# Patient Record
Sex: Female | Born: 1940
Health system: Southern US, Community
[De-identification: ages and names within clinical notes are randomized; demographics above are authoritative.]

## PROBLEM LIST (undated history)

## (undated) DIAGNOSIS — I471 Supraventricular tachycardia, unspecified: Secondary | ICD-10-CM

## (undated) DIAGNOSIS — I10 Essential (primary) hypertension: Secondary | ICD-10-CM

## (undated) DIAGNOSIS — G709 Myoneural disorder, unspecified: Secondary | ICD-10-CM

## (undated) DIAGNOSIS — C801 Malignant (primary) neoplasm, unspecified: Secondary | ICD-10-CM

## (undated) DIAGNOSIS — J45909 Unspecified asthma, uncomplicated: Secondary | ICD-10-CM

## (undated) DIAGNOSIS — T7840XA Allergy, unspecified, initial encounter: Secondary | ICD-10-CM

## (undated) DIAGNOSIS — E7800 Pure hypercholesterolemia, unspecified: Secondary | ICD-10-CM

## (undated) DIAGNOSIS — G629 Polyneuropathy, unspecified: Secondary | ICD-10-CM

## (undated) DIAGNOSIS — K219 Gastro-esophageal reflux disease without esophagitis: Secondary | ICD-10-CM

## (undated) HISTORY — DX: Malignant (primary) neoplasm, unspecified: C80.1

## (undated) HISTORY — DX: Allergy, unspecified, initial encounter: T78.40XA

## (undated) HISTORY — DX: Myoneural disorder, unspecified: G70.9

## (undated) HISTORY — DX: Unspecified asthma, uncomplicated: J45.909

## (undated) HISTORY — DX: Gastro-esophageal reflux disease without esophagitis: K21.9

## (undated) HISTORY — PX: SPINE SURGERY: SHX786

## (undated) HISTORY — PX: BREAST BIOPSY: SHX20

---

## 1999-04-03 ENCOUNTER — Other Ambulatory Visit: Admission: RE | Admit: 1999-04-03 | Discharge: 1999-04-03 | Payer: Self-pay | Admitting: Obstetrics and Gynecology

## 2000-05-16 ENCOUNTER — Encounter: Payer: Self-pay | Admitting: Obstetrics and Gynecology

## 2000-05-16 ENCOUNTER — Encounter: Admission: RE | Admit: 2000-05-16 | Discharge: 2000-05-16 | Payer: Self-pay | Admitting: Obstetrics and Gynecology

## 2001-07-06 ENCOUNTER — Encounter: Payer: Self-pay | Admitting: Obstetrics and Gynecology

## 2001-07-06 ENCOUNTER — Encounter: Admission: RE | Admit: 2001-07-06 | Discharge: 2001-07-06 | Payer: Self-pay | Admitting: Obstetrics and Gynecology

## 2001-09-08 ENCOUNTER — Encounter: Payer: Self-pay | Admitting: Emergency Medicine

## 2001-09-08 ENCOUNTER — Emergency Department (HOSPITAL_COMMUNITY): Admission: EM | Admit: 2001-09-08 | Discharge: 2001-09-08 | Payer: Self-pay | Admitting: Emergency Medicine

## 2001-09-08 ENCOUNTER — Encounter: Payer: Self-pay | Admitting: *Deleted

## 2001-09-18 ENCOUNTER — Encounter: Payer: Self-pay | Admitting: Internal Medicine

## 2002-06-22 ENCOUNTER — Other Ambulatory Visit: Admission: RE | Admit: 2002-06-22 | Discharge: 2002-06-22 | Payer: Self-pay | Admitting: Family Medicine

## 2002-08-19 LAB — HM COLONOSCOPY

## 2003-08-20 HISTORY — PX: SKIN CANCER EXCISION: SHX779

## 2003-10-06 ENCOUNTER — Other Ambulatory Visit: Admission: RE | Admit: 2003-10-06 | Discharge: 2003-10-06 | Payer: Self-pay | Admitting: Family Medicine

## 2004-03-20 ENCOUNTER — Encounter: Admission: RE | Admit: 2004-03-20 | Discharge: 2004-03-20 | Payer: Self-pay | Admitting: Family Medicine

## 2004-08-23 ENCOUNTER — Ambulatory Visit: Payer: Self-pay | Admitting: Internal Medicine

## 2004-09-21 ENCOUNTER — Ambulatory Visit: Payer: Self-pay | Admitting: Internal Medicine

## 2004-11-26 ENCOUNTER — Ambulatory Visit: Payer: Self-pay | Admitting: Internal Medicine

## 2005-01-10 ENCOUNTER — Ambulatory Visit: Payer: Self-pay | Admitting: Internal Medicine

## 2005-05-31 ENCOUNTER — Ambulatory Visit: Payer: Self-pay | Admitting: Internal Medicine

## 2005-07-19 ENCOUNTER — Encounter: Admission: RE | Admit: 2005-07-19 | Discharge: 2005-07-19 | Payer: Self-pay | Admitting: Family Medicine

## 2005-11-26 ENCOUNTER — Ambulatory Visit: Payer: Self-pay | Admitting: Internal Medicine

## 2006-05-06 ENCOUNTER — Ambulatory Visit: Payer: Self-pay | Admitting: Internal Medicine

## 2006-05-14 ENCOUNTER — Ambulatory Visit: Payer: Self-pay | Admitting: Internal Medicine

## 2006-10-01 ENCOUNTER — Ambulatory Visit: Payer: Self-pay | Admitting: Internal Medicine

## 2006-11-17 ENCOUNTER — Ambulatory Visit: Payer: Self-pay | Admitting: Internal Medicine

## 2006-12-09 ENCOUNTER — Ambulatory Visit: Payer: Self-pay | Admitting: Pulmonary Disease

## 2006-12-25 ENCOUNTER — Ambulatory Visit: Payer: Self-pay | Admitting: Internal Medicine

## 2007-10-29 LAB — HM PAP SMEAR: HM Pap smear: NORMAL

## 2008-03-19 LAB — HM MAMMOGRAPHY: HM Mammogram: NORMAL

## 2008-03-29 LAB — HM DEXA SCAN: HM Dexa Scan: NORMAL

## 2009-03-19 HISTORY — PX: LUMBAR LAMINECTOMY: SHX95

## 2009-03-24 ENCOUNTER — Encounter: Admission: RE | Admit: 2009-03-24 | Discharge: 2009-03-24 | Payer: Self-pay | Admitting: Sports Medicine

## 2009-04-13 ENCOUNTER — Inpatient Hospital Stay (HOSPITAL_COMMUNITY): Admission: RE | Admit: 2009-04-13 | Discharge: 2009-04-15 | Payer: Self-pay | Admitting: Orthopedic Surgery

## 2009-05-04 ENCOUNTER — Encounter: Admission: RE | Admit: 2009-05-04 | Discharge: 2009-05-18 | Payer: Self-pay | Admitting: Orthopedic Surgery

## 2009-05-19 ENCOUNTER — Encounter: Admission: RE | Admit: 2009-05-19 | Discharge: 2009-08-17 | Payer: Self-pay | Admitting: Orthopedic Surgery

## 2009-09-05 ENCOUNTER — Emergency Department (HOSPITAL_COMMUNITY): Admission: EM | Admit: 2009-09-05 | Discharge: 2009-09-06 | Payer: Self-pay | Admitting: Emergency Medicine

## 2009-09-06 ENCOUNTER — Ambulatory Visit (HOSPITAL_COMMUNITY): Admission: RE | Admit: 2009-09-06 | Discharge: 2009-09-06 | Payer: Self-pay | Admitting: Emergency Medicine

## 2009-09-06 ENCOUNTER — Ambulatory Visit: Payer: Self-pay | Admitting: Vascular Surgery

## 2010-11-13 ENCOUNTER — Encounter: Payer: Self-pay | Admitting: Family Medicine

## 2010-11-13 DIAGNOSIS — E1169 Type 2 diabetes mellitus with other specified complication: Secondary | ICD-10-CM | POA: Insufficient documentation

## 2010-11-13 DIAGNOSIS — M792 Neuralgia and neuritis, unspecified: Secondary | ICD-10-CM

## 2010-11-13 DIAGNOSIS — E1140 Type 2 diabetes mellitus with diabetic neuropathy, unspecified: Secondary | ICD-10-CM | POA: Insufficient documentation

## 2010-11-13 DIAGNOSIS — J45909 Unspecified asthma, uncomplicated: Secondary | ICD-10-CM | POA: Insufficient documentation

## 2010-11-13 DIAGNOSIS — E785 Hyperlipidemia, unspecified: Secondary | ICD-10-CM

## 2010-11-13 DIAGNOSIS — C4490 Unspecified malignant neoplasm of skin, unspecified: Secondary | ICD-10-CM | POA: Insufficient documentation

## 2010-11-13 DIAGNOSIS — R000 Tachycardia, unspecified: Secondary | ICD-10-CM | POA: Insufficient documentation

## 2010-11-13 DIAGNOSIS — Z780 Asymptomatic menopausal state: Secondary | ICD-10-CM | POA: Insufficient documentation

## 2010-11-13 DIAGNOSIS — E119 Type 2 diabetes mellitus without complications: Secondary | ICD-10-CM

## 2010-11-13 DIAGNOSIS — I471 Supraventricular tachycardia: Secondary | ICD-10-CM

## 2010-11-13 DIAGNOSIS — K449 Diaphragmatic hernia without obstruction or gangrene: Secondary | ICD-10-CM

## 2010-11-13 DIAGNOSIS — I10 Essential (primary) hypertension: Secondary | ICD-10-CM | POA: Insufficient documentation

## 2010-11-13 DIAGNOSIS — I472 Ventricular tachycardia: Secondary | ICD-10-CM | POA: Insufficient documentation

## 2010-11-24 LAB — GLUCOSE, CAPILLARY
Glucose-Capillary: 128 mg/dL — ABNORMAL HIGH (ref 70–99)
Glucose-Capillary: 137 mg/dL — ABNORMAL HIGH (ref 70–99)

## 2010-11-24 LAB — URINE MICROSCOPIC-ADD ON

## 2010-11-24 LAB — PROTIME-INR
INR: 1 (ref 0.00–1.49)
Prothrombin Time: 13 seconds (ref 11.6–15.2)

## 2010-11-24 LAB — CBC
HCT: 35.9 % — ABNORMAL LOW (ref 36.0–46.0)
Hemoglobin: 12.2 g/dL (ref 12.0–15.0)
MCHC: 33.8 g/dL (ref 30.0–36.0)
MCV: 87 fL (ref 78.0–100.0)
Platelets: 257 10*3/uL (ref 150–400)
RBC: 4.13 MIL/uL (ref 3.87–5.11)

## 2010-11-24 LAB — DIFFERENTIAL
Basophils Absolute: 0 10*3/uL (ref 0.0–0.1)
Eosinophils Relative: 4 % (ref 0–5)
Monocytes Absolute: 0.8 10*3/uL (ref 0.1–1.0)
Monocytes Relative: 8 % (ref 3–12)
Neutrophils Relative %: 53 % (ref 43–77)

## 2010-11-24 LAB — COMPREHENSIVE METABOLIC PANEL
Chloride: 98 mEq/L (ref 96–112)
Creatinine, Ser: 0.56 mg/dL (ref 0.4–1.2)
GFR calc Af Amer: >60 mL/min (ref >60)
Sodium: 136 mEq/L (ref 135–145)
Total Protein: 7.5 g/dL (ref 6.0–8.3)

## 2010-11-24 LAB — URINALYSIS, ROUTINE W REFLEX MICROSCOPIC
Glucose, UA: NEGATIVE mg/dL
Nitrite: NEGATIVE
Protein, ur: NEGATIVE mg/dL
Specific Gravity, Urine: 1.009 (ref 1.005–1.030)

## 2010-11-24 LAB — ABO/RH: ABO/RH(D): A NEG

## 2010-11-24 LAB — TYPE AND SCREEN: ABO/RH(D): A NEG

## 2010-11-24 LAB — APTT: aPTT: 26 seconds (ref 24–37)

## 2011-01-01 NOTE — Op Note (Signed)
NAME:  Kristi Willis, HENDERSON           ACCOUNT NO.:  [id number]   MEDICAL RECORD NO.:  [id number]          PATIENT TYPE:  AMB   LOCATION:  DAY                          FACILITY:  [facility]   PHYSICIAN:  Georges Lynch. Gioffre, M.D.DATE OF BIRTH:  [birth year]   DATE OF PROCEDURE:  04/13/2009  DATE OF DISCHARGE:                               OPERATIVE REPORT   SURGEON:  Georges Lynch. Darrelyn Hillock, M.D.   ASSISTANT:  Jene Every, M.D.   PREOPERATIVE DIAGNOSES:  1. Severe lateral recess stenosis at L4-5 and L5-S1 ont right.  2. Large herniated disk at L4-5 on the right.   POSTOPERATIVE DIAGNOSES:  1. Severe lateral recess stenosis at L4-5 and L5-S1 on the right.  2. Large herniated disk at L4-5 on the right.   OPERATION:  1. I did a complete decompressive lumbar laminectomy at L4-L5, L5-S1      on the right for spinal stenosis and lateral recess stenosis.  2. Microdiskectomy at L4-L5 on the right.  All her symptoms on the      right only.   PROCEDURE:  Under general anesthesia with the patient on the spinal  frame, routine orthopedic prep and drape of the back carried out.  She  had 1 gram of IV Ancef.  Two incisions were placed and inserted and two  needles were inserted in the back.  X-ray was taken to verify the  position.  Following that, incision was made over the L4-5, L5-S1  interspace.  At this particular time I separated muscle from the lamina  spinous process bilaterally.  Another x-ray was taken.  Following that I  went ahead and inserted the [facility] retractors.  I isolated the L4-5  and the L5-S1 space on the right.  I first started out my  hemilaminectomy of S1, worked my way up proximally, completely removed  the lamina of L5 as he had severe stenosis and overlapping shingling  effect of the L4-5 region.  I had to go out laterally and decompress the  lateral recess to a partial facetectomy as well.  Once I identified the  S1 root and the L5 root, we cauterized lateral recess veins.   He had a  large disk at L4-5 on the right.  Note the microscope was brought in  during the procedure.  We first obviously removed ligamentum flavum as  well.  We protected the dura at all time with cottonoids.  We made a  cruciate incision in the posterior longitudinal ligament at L4-5 and did  a microdiskectomy.  We utilized a nerve hook and the Epstein curettes  from medially, distally, proximally, and laterally to make sure there  were no other pressures noted.  We did nice foraminotomy for the L5  root.  The root now was wide open.  We then went distal at L5-S1.  There  was more or less a hard disk actually, which was a spur.  No diskectomy  was necessary at L5-S1 on the right.  The nerve root was completely  free.  We were able to easily move it about once we decompressed the  lateral recess and foramina.  After we had good decompression of both  the L5 root and S1 root.  We thoroughly irrigated out the area.  I  injected 10 mL of FloSeal into the area and then loosely applied some  thrombin-soaked Gelfoam distally and proximally but not over the dura.  I then closed the  wound in layers in the usual fashion.  I did not close the distal deep  part of the wound.  A small portion was left open for drainage purposes.  Subcu was closed with Vicryl, skin with metal staples.  Sterile  Neosporin dressing was applied.  The patient left the operating room in  satisfactory condition.           ______________________________  Georges Lynch Darrelyn Hillock, M.D.     RAG/MEDQ  D:  04/13/2009  T:  04/14/2009  Job:  [id number]   cc:   Ernestina Penna, M.D.  Fax: [phone]

## 2011-01-01 NOTE — Assessment & Plan Note (Signed)
[facility]                             PULMONARY OFFICE NOTE   NAME:Kristi Willis, Kristi Willis                  MRN:          [id number]  DATE:12/25/2006                            DOB:          1941/02/28    HISTORY OF PRESENT ILLNESS:  The patient is a 70 year old white female,  patient of Dr. Roxy Cedar, who has a known history of asthma and allergic  rhinitis, returns today related to persistent cough and congestion.  The  patient was seen here 2 weeks ago for an acute tracheal bronchitis.  She  was given a 7 day course of Omnicef.  Patient reports symptoms did  improve, however she continues to have cough, congestion with some  intermittent purulent green sputum.  The patient is [place] and is concerned that symptoms may worsen.  The  patient denies any hemoptysis, chest pain, orthopnea, PND, or leg  swelling.   PAST MEDICAL HISTORY:  Reviewed.   CURRENT MEDICATIONS:  Reviewed.   PHYSICAL EXAMINATION:  Patient is a pleasant female in no acute  distress.  She is afebrile with stable vital signs.  O2 saturation is  97% on room air.  HEENT:  Nasal mucosa has some mild erythema.  Nontender sinuses.  Posterior pharynx is clear.  NECK:  Supple without cervical adenopathy.  No JVD.  LUNGS:  Sounds reveals diminished breath sounds at the bases without any  wheezing or crackles.  CARDIAC:  Regular rate and rhythm.  ABDOMEN:  Soft and benign.  EXTREMITIES:  Warm without any edema.   IMPRESSION:  Slightly resolved tracheal bronchitis with a mild asthmatic  flare.  The patient is to begin Levaquin 750 mg times 5 days with  Mucinex DM twice daily.  Prednisone taper over the next week.  Steroid  talk was given.  The patient is to monitor sugars closely and call the  office if sugars are over 200.  The patient may use Tussionex as needed  for cough.  Number 4 ounces without refills were given.  The patient is  to hold her fish  oil until cough resolves and was given a 10-day course  of Zegerid for any residual reflux that could be irritating the airways.  The patient  does have a history of reflux.  The patient is to return back with Dr.  Maple Hudson as scheduled or sooner if needed.      Rubye Oaks, NP       Clinton D. Maple Hudson, MD, [facility], FACP    TP/MedQ  DD: 12/25/2006  DT: 12/25/2006  Job #: [id number]

## 2011-01-04 NOTE — Letter (Signed)
October 01, 2006    Ernestina Penna, M.D.  [address] [place], [place] [address]   RE:  TYKERIA, WAWRZYNIAK  MRN:  [id number]  /  DOB:  1940-09-21   Dear Roe Coombs:   It was a privilege to see Darliss Cheney at your office's request  because of recurrent tachycardia.   I had seen her about 4-1/2-years ago after she had presented with  supraventricular tachycardia. At that point she had wanted to pursue  medical therapy, so we gave her verapamil to take on a p.r.n. basis.   She was doing fine until an episode about a week ago, it began abruptly.  It was associated with some light-headedness and some palpitations but  no shortness of breath. Later it became associated with diaphoresis. She  actually was able to go to work. She did not feel very well so she drove  back home. She took verapamil at that point, symptoms did not abate. She  took another verapamil some time later and then because symptoms were  still ongoing, although her heart rate was much less, she went to your  office where an electrocardiogram confirmed the conversion to sinus  rhythm.   She has had other episodes in the interim that have been much briefer.   PAST MEDICAL HISTORY:  Notable for hypertension, dyslipidemia and  diabetes.   CURRENT MEDICATIONS:  1. Quinapril 20/25 (half a day).  2. Metformin 1000 a day.  3. Pravastatin 20.   ALLERGIES:  Include SULFA, ZOCOR, CELEBREX.   PHYSICAL EXAMINATION:  Her blood pressure was 140/70, pulse 96.  LUNGS:  Clear.  HEART:  Sounds regular.  Neck veins were flat.  ABDOMEN:  Soft with active bowel sounds without midline pulsation or  hepatomegaly.  Femoral pulses were 2+.  Distal pulses were intact. There were no  clubbing, cyanosis or edema.   Electrocardiogram  dated today demonstrated sinus rhythm at 96 with  intervals of 0.16/0.11/0.37, the axis was 80 degrees. Electrocardiogram  was otherwise unremarkable except for a modulating ST segment.   IMPRESSION:  1. Recurrent supraventricular tachycardia probably AV nodal reentry.  2. Asthma.  3. Hypertension on therapy.  4. Diabetes.   Ms. Granlund is stable. She would like to continue her verapamil on a  p.r.n. basis. She would like to followup with [place] on a p.r.n. basis.   I have encouraged her to followup closely with you regarding primary  risk management with her diabetes, hypertension and obesity. She clearly  is at high risk for heart disease.   I will see her again at your request.    Sincerely,      Duke Salvia, MD, [facility]  Electronically Signed    SCK/MedQ  DD: 10/01/2006  DT: 10/01/2006  Job #: [id number]

## 2012-02-14 ENCOUNTER — Encounter (HOSPITAL_COMMUNITY): Payer: Self-pay | Admitting: Emergency Medicine

## 2012-02-14 ENCOUNTER — Emergency Department (HOSPITAL_COMMUNITY): Payer: Medicare Other

## 2012-02-14 ENCOUNTER — Emergency Department (HOSPITAL_COMMUNITY)
Admission: EM | Admit: 2012-02-14 | Discharge: 2012-02-14 | Disposition: A | Discharge status: Home / Self Care | Payer: Medicare Other | Attending: Cardiovascular Disease | Admitting: Cardiovascular Disease

## 2012-02-14 DIAGNOSIS — Z79899 Other long term (current) drug therapy: Secondary | ICD-10-CM | POA: Insufficient documentation

## 2012-02-14 DIAGNOSIS — I10 Essential (primary) hypertension: Secondary | ICD-10-CM | POA: Insufficient documentation

## 2012-02-14 DIAGNOSIS — I498 Other specified cardiac arrhythmias: Secondary | ICD-10-CM | POA: Insufficient documentation

## 2012-02-14 DIAGNOSIS — I471 Supraventricular tachycardia: Secondary | ICD-10-CM

## 2012-02-14 DIAGNOSIS — R079 Chest pain, unspecified: Secondary | ICD-10-CM

## 2012-02-14 DIAGNOSIS — E119 Type 2 diabetes mellitus without complications: Secondary | ICD-10-CM | POA: Insufficient documentation

## 2012-02-14 DIAGNOSIS — R000 Tachycardia, unspecified: Secondary | ICD-10-CM | POA: Diagnosis present

## 2012-02-14 DIAGNOSIS — E7800 Pure hypercholesterolemia, unspecified: Secondary | ICD-10-CM | POA: Insufficient documentation

## 2012-02-14 DIAGNOSIS — I472 Ventricular tachycardia: Secondary | ICD-10-CM | POA: Diagnosis present

## 2012-02-14 HISTORY — DX: Essential (primary) hypertension: I10

## 2012-02-14 HISTORY — DX: Pure hypercholesterolemia, unspecified: E78.00

## 2012-02-14 HISTORY — DX: Supraventricular tachycardia: I47.1

## 2012-02-14 HISTORY — DX: Supraventricular tachycardia, unspecified: I47.10

## 2012-02-14 HISTORY — DX: Polyneuropathy, unspecified: G62.9

## 2012-02-14 LAB — POCT I-STAT, CHEM 8
BUN: 13 mg/dL (ref 6–23)
Calcium, Ion: 1.18 mmol/L (ref 1.12–1.32)
Chloride: 101 mEq/L (ref 96–112)
Glucose, Bld: 181 mg/dL — ABNORMAL HIGH (ref 70–99)
TCO2: 21 mmol/L (ref 0–100)

## 2012-02-14 LAB — CBC WITH DIFFERENTIAL/PLATELET
Basophils Absolute: 0.1 10*3/uL (ref 0.0–0.1)
Basophils Relative: 0 % (ref 0–1)
Eosinophils Relative: 5 % (ref 0–5)
HCT: 39.9 % (ref 36.0–46.0)
Hemoglobin: 13.1 g/dL (ref 12.0–15.0)
Lymphocytes Relative: 40 % (ref 12–46)
MCHC: 32.8 g/dL (ref 30.0–36.0)
MCV: 85.8 fL (ref 78.0–100.0)
Monocytes Absolute: 1 10*3/uL (ref 0.1–1.0)
Monocytes Relative: 7 % (ref 3–12)
Neutro Abs: 7.3 10*3/uL (ref 1.7–7.7)
RDW: 13.4 % (ref 11.5–15.5)

## 2012-02-14 LAB — CARDIAC PANEL(CRET KIN+CKTOT+MB+TROPI)
CK, MB: 2.7 ng/mL (ref 0.3–4.0)
Relative Index: INVALID (ref 0.0–2.5)
Total CK: 66 U/L (ref 7–177)
Troponin I: <0.3 ng/mL (ref <0.30)

## 2012-02-14 LAB — POCT I-STAT TROPONIN I: Troponin i, poc: 0.01 ng/mL (ref 0.00–0.08)

## 2012-02-14 LAB — URINALYSIS, ROUTINE W REFLEX MICROSCOPIC
Bilirubin Urine: NEGATIVE
Ketones, ur: NEGATIVE mg/dL
Nitrite: NEGATIVE
Protein, ur: NEGATIVE mg/dL
Urobilinogen, UA: 0.2 mg/dL (ref 0.0–1.0)

## 2012-02-14 LAB — URINE MICROSCOPIC-ADD ON

## 2012-02-14 MED ORDER — DILTIAZEM HCL ER COATED BEADS 120 MG PO CP24: 120.0000 mg | ORAL_CAPSULE | Freq: Every day | ORAL | Status: DC

## 2012-02-14 MED ORDER — ADENOSINE 6 MG/2ML IV SOLN
6.0000 mg | Freq: Once | INTRAVENOUS | Status: AC
  Administered 2012-02-14: 12 mg via INTRAVENOUS
  Administered 2012-02-14: 6 mg via INTRAVENOUS

## 2012-02-14 MED ORDER — ADENOSINE 6 MG/2ML IV SOLN
INTRAVENOUS | Status: AC
  Administered 2012-02-14: 6 mg via INTRAVENOUS
  Filled 2012-02-14: qty 10

## 2012-02-14 MED ORDER — VERAPAMIL HCL 40 MG PO TABS: 40.0000 mg | ORAL_TABLET | ORAL | Status: DC | PRN

## 2012-02-14 MED ORDER — ASPIRIN 81 MG PO TABS: 81.0000 mg | ORAL_TABLET | Freq: Every day | ORAL | Status: DC

## 2012-02-14 NOTE — ED Notes (Signed)
Pt states now that she feels much better and does not feel weak again

## 2012-02-14 NOTE — ED Notes (Signed)
The pt has no complaints.  She says she feels ok now that her  Heart rate has slowed.  Husband at her bedside

## 2012-02-14 NOTE — ED Notes (Signed)
Cardiology at bedside.

## 2012-02-14 NOTE — ED Notes (Signed)
Pt brought back immediately to room B15, new and old EKG given to Dr Nicanor Alcon, Dr Nicanor Alcon came to bedside immediately

## 2012-02-14 NOTE — Consult Note (Signed)
CARDIOLOGY CONSULT NOTE   Patient ID: Kristi Willis MRN: [id number] DOB/AGE: 1940/08/26 71 y.o.  Admit date: 02/14/2012  Primary Physician   Dr Kristi Willis at [facility] Primary Cardiologist   New Reason for Consultation   SVT  FAO:[id number] Kristi Willis is Kristi 71 y.o. female with Kristi long history of SVT but no CAD.  She was treated for SVT by [facility] > 12 years ago, name unknown. Since then, she has had symptoms 3 or 4 times Kristi year. They will start suddenly and are associated with fatigue. There is no chest pain, shortness of breath or diaphoresis. She denies dizziness or presyncope. She has treated them with when necessary Verapamil 30 mg and will repeat that dose if she does not convert. The verapamil worked until yesterday. After she converts, she will feel washed out the rest of the day but the next day she will feel fine.   At approximately 10 PM last night, she had sudden onset of tachycardia palpitations. She felt fatigued with it which is her usual symptom. She took verapamil and then Kristi second dose when the first one did not work. She went to bed and was able to sleep. At 2 AM, she woke with ongoing tachycardia palpitations, diaphoresis and substernal chest tightness at Kristi 3 or 4/10. When her symptoms did not resolve, she came to the emergency room. In the emergency room, she was treated with 2 doses of adenosine and converted to sinus rhythm. She has maintained sinus rhythm since then. Once she converted to sinus rhythm her chest pain resolved. She has never had chest pain like this before.   Past Medical History  Diagnosis Date  . SVT (supraventricular tachycardia)   . Hypertension   . Diabetes mellitus   . Hypercholesteremia   . Neuropathy    Past Surgical History  Procedure Date  . Lumbar laminectomy 08/10   Allergies  Allergen Reactions  . Phenylephrine Hcl Other (See Comments)    unknown  . Ultram (Tramadol Hcl) Other (See Comments)    Insomnia  . Celebrex (Celecoxib) Rash  .  Cortisone Rash  . Fenofibrate Rash  . Sulfa Antibiotics Rash   I have reviewed the patient's current medications   . adenosine (ADENOCARD) IV  6 mg/12mg  Intravenous Once   Medication Sig  amitriptyline (ELAVIL) 25 MG tablet Take 25 mg by mouth at bedtime.    calcium carbonate (OS-CAL) 600 MG TABS Take 600 mg by mouth daily.    Cholecalciferol (VITAMIN D3) 1000 UNITS CAPS Take by mouth daily.    metFORMIN (GLUCOPHAGE) 1000 MG tablet Take 1,000 mg by mouth 2 x day    mometasone (NASONEX) 50 MCG/ACT nasal spray 2 sprays by Nasal route PRN    Multiple Vitamin (MULTIVITAMIN) tablet Take 1 tablet by mouth daily.    quinapril-hydrochlorothiazide  20-25 MG per tablet 1/2 pill by mouth daily.   rosuvastatin (CRESTOR) 10 MG tablet Take 10 mg by mouth daily.    vitamin B-12 (CYANOCOBALAMIN) 500 MCG tablet Take 500 mcg by mouth daily.     History   Social History  . Marital Status: Married    Spouse Name: N/Kristi    Number of Children: N/Kristi  . Years of Education: N/Kristi   Occupational History  . Retired from [employer] but still works part-time    Social History Main Topics  . Smoking status: Never Smoker   . Smokeless tobacco: Never Used  . Alcohol Use: No  . Drug Use: No  .  Sexually Active: Not on file   Other Topics Concern  . Not on file   Social History Narrative   Lives with husband, who is Kristi patient of Dr Kristi Willis. She is active around the house and exercises regularly.   Family Status  Relation Status Death Age  . Mother Deceased 72s    No CAD  . Father Deceased 21s    Hx CAD  . Sister Alive     Sister 7 yr younger Hx MI   ROS: She has neuropathy and walks fairly well as long as she does not have to walk very fast. She is active around the house and yard and at work without ischemic symptoms. She has musculoskeletal aches and pains at times. She has not had any recent illnesses, fevers or chills. She has not had melena. Full 14 point review of systems complete and found to be  negative unless listed above.  Physical Exam: Blood pressure 121/70, pulse 86, temperature 98.1 F (36.7 C), temperature source Oral, resp. rate 19, SpO2 100.00%.  General: Well developed, well nourished, female in no acute distress Head: Eyes PERRLA, No xanthomas.   Normocephalic and atraumatic, oropharynx without edema or exudate. Dentition: Good Lungs: Clear bilaterally  Heart: HRRR S1 S2, no rub/gallop, no murmur. pulses are 2+ all 4 extrem.   Neck: No carotid bruits. No lymphadenopathy.  JVD not elevated. Abdomen: Bowel sounds present, abdomen soft and non-tender without masses or hernias noted. Msk:  No spine or cva tenderness. No weakness, no joint deformities or effusions. Extremities: No clubbing or cyanosis. No edema.  Neuro: Alert and oriented X 3. No focal deficits noted. Psych:  Good affect, responds appropriately Skin: No rashes or lesions noted.  Labs:   Lab Results  Component Value Date   WBC 15.1* 02/14/2012   HGB 13.9 02/14/2012   HCT 41.0 02/14/2012   MCV 85.8 02/14/2012   PLT 316 02/14/2012     Lab 02/14/12 0339  NA 138  K 4.0  CL 101  CO2 --  BUN 13  CREATININE 0.60  CALCIUM --  PROT --  BILITOT --  ALKPHOS --  ALT --  AST --  GLUCOSE 181*    Basename 02/14/12 0543 02/14/12 0338  TROPIPOC 0.00 0.01   ECG:  See strips/ECG in paper chart  Radiology:  Dg Chest [facility] 1 View 02/14/2012  *RADIOLOGY REPORT*  Clinical Data: Increased heart rate.  PORTABLE CHEST - 1 VIEW  Comparison: 04/11/2009  Findings: The slightly shallow inspiration.  Normal heart size and pulmonary vascularity.  No focal airspace consolidation in the lungs.  No blunting of costophrenic angles.  No pneumothorax.  No significant changes since the previous study.  IMPRESSION: No evidence of active pulmonary disease.  Original Report Authenticated By: Marlon Pel, M.D.    ASSESSMENT AND PLAN:   The patient was seen today by Dr Kristi Willis, the patient evaluated and the data reviewed.    Principal Problem:  *SVT (supraventricular tachycardia) - [name] is not sure that her symptoms have increased in frequency over time but this episode was definitely the worst one she's had in over 10 years. As she has had these on Kristi regular basis all her life, referral to EP is appropriate. We will add diltiazem 120 mg daily to her medication regimen. She can continue to use verapamil when necessary as well.  Active Problems:  Chest pain - she had this pain in the setting of Kristi heart rate of 180 for more than  4 hours. Her symptoms resolved and her initial enzymes are negative. We will recheck enzymes in approximately 12 hours after the onset of symptoms. If these are negative and her repeat ECG shows no acute changes, she can be safely discharged home on aspirin, with an outpatient stress test arranged.   Signed: Theodore Demark 02/14/2012, 6:30 AM Co-Sign MD  Patient seen, examined. Available data reviewed. Agree with findings, assessment, and plan as outlined by Theodore Demark, PA-C. The patient was independently examined. I carefully reviewed her history. She had Kristi typical episode of SVT, but more prolonged than her normal. She has not needed to be hospitalized for this in many years. She has no symptoms now she has converted after administration of adenosine. We have recommended the addition of diltiazem 120 mg daily. She will undergo Kristi nuclear study to rule out ischemia as an outpatient since she had associated chest pain. Her cardiac enzymes have been negative now x2 sets. Will arrange outpatient followup with one of our electrophysiologists.  Tonny Bollman, M.D. 02/14/2012 12:45 PM

## 2012-02-14 NOTE — ED Notes (Signed)
Pt reports hx of SVT; pt felt heart racing at 10pm took verapmil at this time, then later she still felt HR racing took another verapmil and pt was able to go to sleep, woke up 2- 2:30 diaphoretic, heart still racing, weakness and chest pressure; pt noted to be HR 177 at nurse first

## 2012-02-14 NOTE — ED Notes (Signed)
The pt is waiting for blood work and a cardiologist to see at 0600am.  Blankets given to the pt and her husband .  The pt has no pain nsr on the monitor.  Head lowered so the pt could rest

## 2012-02-14 NOTE — Progress Notes (Signed)
Reviewed labs. F/u enzymes are negative. No recurrence of pain. F/u appts made. OK for d/c.

## 2012-02-14 NOTE — ED Notes (Signed)
No needs, abc intact, will monitor pt.  Awaiting room.

## 2012-02-14 NOTE — ED Notes (Signed)
Pt is awaiting cardiology to review blood work.

## 2012-02-14 NOTE — Discharge Instructions (Signed)
You have a Stress Test scheduled  at [facility]  No food/drink after midnight before. No caffeine/decaf products 24hr before, including meds such as Excedrin or Goody Powders. Call if there are any questions. OK to take am meds with a sip of water. Arrive about 15 min early for paperwork. Wear comfortable clothes and shoes. Do NOT take: Beta blockers such as metoprolol/lopressor/Toprol XL or calcium channel blockers such as cardizem/Diltiazem or verapmil/Calan for 24 hours before the test.  Remove nitroglycerin patches and do not take nitrate preparations such as Imdur/isosorbide. No Persantine/Theophylline or Aggrenox meds should be used within 24 hours of the test. Discuss with MD what to do about diabetes meds if you take these.  When you arrive in the lab, the technician will inject a small amount of radioactive tracer. After a waiting period, resting pictures will be obtained.   You will be prepped for the stress portion of the test. With the stress (medical or treadmill), another small amount of radioactive tracer will be injected.  You will get a second set of pictures after a waiting period.   The whole test will take several hours.  

## 2012-02-14 NOTE — ED Provider Notes (Signed)
History     CSN: [id number]  Arrival date & time 02/14/12  5284   First MD Initiated Contact with Patient 02/14/12 0319      Chief Complaint  Patient presents with  . Tachycardia    (Consider location/radiation/quality/duration/timing/severity/associated sxs/prior treatment) Patient is a 71 y.o. female presenting with palpitations. The history is provided by the patient.  Palpitations  This is a recurrent problem. The current episode started 3 to 5 hours ago. The problem occurs constantly. The problem has not changed since onset.Associated with: nothing. Associated symptoms include diaphoresis, chest pain and chest pressure. Pertinent negatives include no fever. Associated symptoms comments: diaphoresis. Treatments tried: old verapmil. The treatment provided no relief. Risk factors include no known risk factors. Her past medical history does not include hyperthyroidism.    Past Medical History  Diagnosis Date  . SVT (supraventricular tachycardia)   . Hypertension   . Diabetes mellitus   . Hypercholesteremia   . Neuropathy     Past Surgical History  Procedure Date  . Lumbar laminectomy 08/10    History reviewed. No pertinent family history.  History  Substance Use Topics  . Smoking status: Never Smoker   . Smokeless tobacco: Not on file  . Alcohol Use: No    OB History    Grav Para Term Preterm Abortions TAB SAB Ect Mult Living                  Review of Systems  Constitutional: Positive for diaphoresis. Negative for fever.  Respiratory: Positive for chest tightness.   Cardiovascular: Positive for chest pain and palpitations.  All other systems reviewed and are negative.    Allergies  Phenylephrine hcl; Ultram; Celebrex; Cortisone; Fenofibrate; and Sulfa antibiotics  Home Medications   Current Outpatient Rx  Name Route Sig Dispense Refill  . AMITRIPTYLINE HCL 25 MG PO TABS Oral Take 25 mg by mouth at bedtime.      Marland Kitchen CALCIUM CARBONATE 600 MG PO TABS Oral  Take 600 mg by mouth daily.      Marland Kitchen VITAMIN D3 1000 UNITS PO CAPS Oral Take by mouth daily.      Marland Kitchen METFORMIN HCL 1000 MG PO TABS Oral Take 1,000 mg by mouth 2 (two) times daily.      . MOMETASONE FUROATE 50 MCG/ACT NA SUSP Nasal 2 sprays by Nasal route as needed.      Marland Kitchen ONE-DAILY MULTI VITAMINS PO TABS Oral Take 1 tablet by mouth daily.      . QUINAPRIL-HYDROCHLOROTHIAZIDE 20-25 MG PO TABS  1/2 pill by mouth daily.     Marland Kitchen ROSUVASTATIN CALCIUM 10 MG PO TABS Oral Take 10 mg by mouth daily.      Marland Kitchen VITAMIN B-12 500 MCG PO TABS Oral Take 500 mcg by mouth daily.        BP 102/60  Pulse 93  Temp 98.1 F (36.7 C) (Oral)  Resp 14  SpO2 99%  Physical Exam  Constitutional: She is oriented to person, place, and time. She appears well-developed and well-nourished. No distress.  HENT:  Head: Normocephalic and atraumatic.  Mouth/Throat: Oropharynx is clear and moist.  Eyes: Conjunctivae are normal. Pupils are equal, round, and reactive to light.  Neck: Normal range of motion. Neck supple.  Cardiovascular: Tachycardia present.   Pulmonary/Chest: Effort normal and breath sounds normal. She has no wheezes. She has no rales.  Abdominal: Soft. Bowel sounds are normal. There is no tenderness. There is no rebound and no guarding.  Musculoskeletal: Normal  range of motion.  Neurological: She is alert and oriented to person, place, and time.  Skin: Skin is warm and dry.  Psychiatric: She has a normal mood and affect.    ED Course  Procedures (including critical care time)  Labs Reviewed  CBC WITH DIFFERENTIAL - Abnormal; Notable for the following:    WBC 15.1 (*)     Lymphs Abs 6.0 (*)     Eosinophils Absolute 0.8 (*)     All other components within normal limits  POCT I-STAT, CHEM 8 - Abnormal; Notable for the following:    Glucose, Bld 181 (*)     All other components within normal limits  POCT I-STAT TROPONIN I   Dg Chest Port 1 View  02/14/2012  *RADIOLOGY REPORT*  Clinical Data: Increased  heart rate.  PORTABLE CHEST - 1 VIEW  Comparison: 04/11/2009  Findings: The slightly shallow inspiration.  Normal heart size and pulmonary vascularity.  No focal airspace consolidation in the lungs.  No blunting of costophrenic angles.  No pneumothorax.  No significant changes since the previous study.  IMPRESSION: No evidence of active pulmonary disease.  Original Report Authenticated By: Marlon Pel, M.D.     No diagnosis found.    MDM  Bolus, adenosine 6 without cardioversion, adenosine 12 with successful cardioversion CRITICAL CARE Performed by: Jasmine Awe   Total critical care time: 30 minutes  Critical care time was exclusive of separately billable procedures and treating other patients.  Critical care was necessary to treat or prevent imminent or life-threatening deterioration.  Critical care was time spent personally by me on the following activities: development of treatment plan with patient and/or surrogate as well as nursing, discussions with consultants, evaluation of patient's response to treatment, examination of patient, obtaining history from patient or surrogate, ordering and performing treatments and interventions, ordering and review of laboratory studies, ordering and review of radiographic studies, pulse oximetry and re-evaluation of patient's condition.   Case d/w Dr. Mayford Knife to be seen by cards at 6 am.     Date: 02/14/2012  Rate:176  Rhythm: supraventricular tachycardia (SVT)  QRS Axis: normal  Intervals: normal  ST/T Wave abnormalities: nonspecific ST changes  Conduction Disutrbances:none  Narrative Interpretation:   Old EKG Reviewed: none available  Post conversion   Date: 02/14/2012  Rate: 96  Rhythm: normal sinus rhythm  QRS Axis: right  Intervals: normal  ST/T Wave abnormalities: normal  Conduction Disutrbances:none  Narrative Interpretation:   Old EKG Reviewed: none available       Ester Hilley K Chayce Rullo-Rasch, MD 02/14/12  [phone]

## 2012-02-25 ENCOUNTER — Ambulatory Visit (HOSPITAL_COMMUNITY): Payer: Medicare Other | Attending: Cardiology | Admitting: Radiology

## 2012-02-25 VITALS — BP 132/57 | Ht 64.0 in | Wt 154.0 lb

## 2012-02-25 DIAGNOSIS — Z8249 Family history of ischemic heart disease and other diseases of the circulatory system: Secondary | ICD-10-CM | POA: Insufficient documentation

## 2012-02-25 DIAGNOSIS — R5383 Other fatigue: Secondary | ICD-10-CM | POA: Insufficient documentation

## 2012-02-25 DIAGNOSIS — I471 Supraventricular tachycardia, unspecified: Secondary | ICD-10-CM | POA: Insufficient documentation

## 2012-02-25 DIAGNOSIS — R079 Chest pain, unspecified: Secondary | ICD-10-CM | POA: Insufficient documentation

## 2012-02-25 DIAGNOSIS — E785 Hyperlipidemia, unspecified: Secondary | ICD-10-CM | POA: Insufficient documentation

## 2012-02-25 DIAGNOSIS — J45909 Unspecified asthma, uncomplicated: Secondary | ICD-10-CM | POA: Insufficient documentation

## 2012-02-25 DIAGNOSIS — E119 Type 2 diabetes mellitus without complications: Secondary | ICD-10-CM | POA: Insufficient documentation

## 2012-02-25 DIAGNOSIS — R0789 Other chest pain: Secondary | ICD-10-CM

## 2012-02-25 DIAGNOSIS — I10 Essential (primary) hypertension: Secondary | ICD-10-CM | POA: Insufficient documentation

## 2012-02-25 DIAGNOSIS — R0609 Other forms of dyspnea: Secondary | ICD-10-CM | POA: Insufficient documentation

## 2012-02-25 DIAGNOSIS — R0989 Other specified symptoms and signs involving the circulatory and respiratory systems: Secondary | ICD-10-CM | POA: Insufficient documentation

## 2012-02-25 DIAGNOSIS — R5381 Other malaise: Secondary | ICD-10-CM | POA: Insufficient documentation

## 2012-02-25 MED ORDER — REGADENOSON 0.4 MG/5ML IV SOLN
0.4000 mg | Freq: Once | INTRAVENOUS | Status: AC
  Administered 2012-02-25: 0.4 mg via INTRAVENOUS

## 2012-02-25 MED ORDER — TECHNETIUM TC 99M TETROFOSMIN IV KIT
30.0000 | PACK | Freq: Once | INTRAVENOUS | Status: AC | PRN
  Administered 2012-02-25: 30 via INTRAVENOUS

## 2012-02-25 MED ORDER — TECHNETIUM TC 99M TETROFOSMIN IV KIT
10.0000 | PACK | Freq: Once | INTRAVENOUS | Status: AC | PRN
  Administered 2012-02-25: 10 via INTRAVENOUS

## 2012-02-25 NOTE — Progress Notes (Signed)
[facility] SITE 3 NUCLEAR MED [address] [place] [address] [phone]  Cardiology Nuclear Med Study  Kristi Willis is a 71 y.o. female     MRN : [id number]     DOB: Jan 01, 1941  Procedure Date: 02/25/2012  Nuclear Med Background Indication for Stress Test:  Evaluation for Ischemia, and Patient seen in hospital on 02-14-12 for CP and PSVT,Enzymes negative History:  Asthma and '03 MPS: EF: 63% (-) ischemia SVT  Cardiac Risk Factors: Family History - CAD, Hypertension, Lipids and NIDDM  Symptoms:  Chest Pain, DOE and Fatigue   Nuclear Pre-Procedure Caffeine/Decaff Intake:  None > 12 hrs NPO After: 9:00pm   Lungs:  clear O2 Sat: 95% on room air. IV 0.9% NS with Angio Cath:  20g  IV Site: R Antecubital x 1, tolerated well IV Started by:  Irean Hong, RN  Chest Size (in):  36 Cup Size: B  Height: 5\' 4"  (1.626 m)  Weight:  154 lb (69.854 kg)  BMI:  Body mass index is 26.43 kg/(m^2). Tech Comments:  Held metformin today    Nuclear Med Study 1 or 2 day study: 1 day  Stress Test Type:  Treadmill/Lexiscan  Reading MD: Marca Ancona, MD  Order Authorizing Provider:  Tonny Bollman, MD, and Sherryl Manges, MD  Resting Radionuclide: Technetium 66m Tetrofosmin  Resting Radionuclide Dose: 10.9 mCi   Stress Radionuclide:  Technetium 66m Tetrofosmin  Stress Radionuclide Dose: 33.0 mCi           Stress Protocol Rest HR: 88 Stress HR: 115  Rest BP:132/57 Stress BP: 171/51  Exercise Time (min): n/a METS: n/a   Predicted Max HR: 150 bpm % Max HR: 76.67 bpm Rate Pressure Product: 84132   Dose of Adenosine (mg):  n/a Dose of Lexiscan: 0.4 mg  Dose of Atropine (mg): n/a Dose of Dobutamine: n/a mcg/kg/min (at max HR)  Stress Test Technologist: Milana Na, EMT-P  Nuclear Technologist:  Domenic Polite, CNMT     Rest Procedure:  Myocardial perfusion imaging was performed at rest 45 minutes following the intravenous administration of Technetium 60m  Tetrofosmin. Rest ECG: NSR - Normal EKG  Stress Procedure:  The patient received IV Lexiscan 0.4 mg over 15-seconds with concurrent low level exercise and then Technetium 29m Tetrofosmin was injected at 30-seconds while the patient continued walking one more minute. There were no significant changes, + sob, with Lexiscan. Quantitative spect images were obtained after a 45-minute delay. Stress ECG: No significant ST segment change suggestive of ischemia.  QPS Raw Data Images:  Normal; no motion artifact; normal heart/lung ratio. Stress Images:  Normal homogeneous uptake in all areas of the myocardium. Rest Images:  Normal homogeneous uptake in all areas of the myocardium. Subtraction (SDS):  There is no evidence of scar or ischemia. Transient Ischemic Dilatation (Normal <1.22):  1.12 Lung/Heart Ratio (Normal <0.45):  0.36  Quantitative Gated Spect Images QGS EDV:  69 ml QGS ESV:  23 ml  Impression Exercise Capacity:  Lexiscan with no exercise. BP Response:  Normal blood pressure response. Clinical Symptoms:  Short of breath.  ECG Impression:  Insignificant upsloping ST segment depression. Comparison with Prior Nuclear Study: No images to compare  Overall Impression:  Normal stress nuclear study.  LV Ejection Fraction: 67%.  LV Wall Motion:  NL LV Function; NL Wall Motion  Marca Ancona 02/25/2012

## 2012-02-26 ENCOUNTER — Encounter: Payer: Self-pay | Admitting: Internal Medicine

## 2012-02-26 ENCOUNTER — Ambulatory Visit (INDEPENDENT_AMBULATORY_CARE_PROVIDER_SITE_OTHER): Payer: Medicare Other | Admitting: Internal Medicine

## 2012-02-26 VITALS — BP 138/58 | HR 94 | Ht 64.0 in | Wt 157.2 lb

## 2012-02-26 DIAGNOSIS — I4729 Other ventricular tachycardia: Secondary | ICD-10-CM

## 2012-02-26 DIAGNOSIS — R002 Palpitations: Secondary | ICD-10-CM

## 2012-02-26 DIAGNOSIS — R000 Tachycardia, unspecified: Secondary | ICD-10-CM

## 2012-02-26 DIAGNOSIS — I10 Essential (primary) hypertension: Secondary | ICD-10-CM

## 2012-02-26 DIAGNOSIS — I472 Ventricular tachycardia: Secondary | ICD-10-CM

## 2012-02-26 MED ORDER — ATENOLOL 50 MG PO TABS: 50.0000 mg | ORAL_TABLET | Freq: Two times a day (BID) | ORAL | Status: DC

## 2012-02-26 MED ORDER — METOPROLOL TARTRATE 50 MG PO TABS: 50.0000 mg | ORAL_TABLET | Freq: Two times a day (BID) | ORAL | Status: DC

## 2012-02-26 MED ORDER — METOPROLOL SUCCINATE ER 50 MG PO TB24: 50.0000 mg | ORAL_TABLET | Freq: Every day | ORAL | Status: DC

## 2012-02-26 NOTE — Assessment & Plan Note (Addendum)
The patient carries a diagnosis of supraventricular tachycardia.  The review of the adenosine strips clearly demonstrate slowing and then reacceleration with progressive widening of the QRS consistent with rate related widening.  The mechanism is atrial tachycardia  We discussed treatment strategies. She is disinclined at this time to pursue ablation. With her history of hypertension, she has not previously been on beta blockers, we'll try a serial trial including atenolol, metoprolol tartrate and succinate. We'll see her in 3 months.,

## 2012-02-26 NOTE — Progress Notes (Signed)
.  HPI  Kristi Willis is a 71 y.o. female Seen in consultation for SVT. Apparently she'd seen electrophysiology 10-15 years ago and was told she had SVT. These episodes are abrupt in onset and offset occurring a half dozen times a year lasting minutes to hours. They're associated with some degree of lightheadedness and chest discomfort but mostly just weakness. They're not associated with caffeine. There are frog positive but diuretic negative. She was seen recently in the emergency room for these episodes; she received adenosine 6/12 and the tachycardia slowed, but did not terminate  She has no known structural heart disease: A. stress Myoview July 2013 demonstrated normal left ventricular function and no ischemia.  She denies shortness of breath chest pain; she has has some peripheral edema. She has diabetes. She has hypertension. She is not on an ACE inhibitor.    Past Medical History  Diagnosis Date  . SVT (supraventricular tachycardia)   . Hypertension   . Diabetes mellitus   . Hypercholesteremia   . Neuropathy     Past Surgical History  Procedure Date  . Lumbar laminectomy 08/10    Current Outpatient Prescriptions  Medication Sig Dispense Refill  . amitriptyline (ELAVIL) 25 MG tablet Take 25 mg by mouth at bedtime.        Marland Kitchen aspirin 81 MG tablet Take 1 tablet (81 mg total) by mouth daily.  30 tablet    . calcium carbonate (OS-CAL) 600 MG TABS Take 600 mg by mouth daily.        . Cholecalciferol (VITAMIN D3) 1000 UNITS CAPS Take by mouth daily.        Marland Kitchen diltiazem (CARDIZEM CD) 120 MG 24 hr capsule Take 1 capsule (120 mg total) by mouth daily.  30 capsule  11  . metFORMIN (GLUCOPHAGE) 1000 MG tablet Take 1,000 mg by mouth 2 (two) times daily.        . mometasone (NASONEX) 50 MCG/ACT nasal spray 2 sprays by Nasal route as needed.        . Multiple Vitamin (MULTIVITAMIN) tablet Take 1 tablet by mouth daily.        . quinapril-hydrochlorothiazide (ACCURETIC) 20-25 MG per  tablet 1/2 pill by mouth daily.       . rosuvastatin (CRESTOR) 10 MG tablet Take 10 mg by mouth daily.        . verapamil (CALAN) 40 MG tablet Take 1 tablet (40 mg total) by mouth as needed. Take 1 tablet PRN for tachycardia. May take another one in 2 hours if symptoms not resolve.  30 tablet  6  . vitamin B-12 (CYANOCOBALAMIN) 500 MCG tablet Take 500 mcg by mouth daily.          Allergies  Allergen Reactions  . Phenylephrine Hcl Other (See Comments)    unknown  . Ultram (Tramadol Hcl) Other (See Comments)    Insomnia  . Celebrex (Celecoxib) Rash  . Cortisone Rash  . Fenofibrate Rash  . Sulfa Antibiotics Rash    Review of Systems negative except from HPI and PMH  Physical Exam BP 138/58  Pulse 94  Ht 5\' 4"  (1.626 m)  Wt 157 lb 4 oz (71.328 kg)  BMI 26.99 kg/m2 Alert and oriented in no acute distress HENT- normal Eyes- EOMI, without scleral icterus Skin- warm and dry; without rashes LN-neg Neck- supple without thyromegaly, JVP-flat, carotids brisk and full without bruits Back-without CVAT or kyphosis Lungs-clear to auscultation CV-Regular rate and rhythm, nl S1 and S2, no murmurs gallops or  rubs, S4-absent Abd-soft with active bowel sounds; no midline pulsation or hepatomegaly Pulses-intact femoral and distal MKS-without gross deformity Neuro- Ax O, CN3-12 intact, grossly normal motor and sensory function Affect engaging  Electrocardiogram today demonstrates sinus rhythm at 94 Intervals 16/11/40 Nonspecific ST changes.  Electrocardiogram of tachycardia from 20 June demonstrates a wide complex tachycardia with right bundle branch block right axis deviation. I am not convinced that are not intermittent retrograde P waves suggestive of ventricular tachycardia.but on review of the strrips with adenosine, it is clear that there is incomplete RBBB at slow rates with widening of QRS with faster rates   Assessment and  Plan

## 2012-02-26 NOTE — Assessment & Plan Note (Signed)
In the context of DM  She needs ACE  But will defer initianto until after we find a beta blocker. She is having edema which may be attributable to her amlodipine and so we will stop it. She is also on Cardizem and feels worse on the. It also.

## 2012-02-26 NOTE — Patient Instructions (Signed)
Your physician has recommended you make the following change in your medication:  1) Stop norvasc (amlodipine) 2) Stop diltiazem (cardizem) - you have also been given prescriptions for 3 different beta blockers to try. You may take them in any order. DO NOT take more than one at a time. Please try each one for at least 2-3 weeks each. They are as follows: 3) Metoprolol succ (toprol) 50 mg one tablet by mouth daily. 4) Metoprolol tart (lopressor) 50 mg one tablet by mouth twice daily 5) Atenolol 50 mg one tablet by mouth daily.  Your physician recommends that you schedule a follow-up appointment in: 3 months with Dr. Graciela Husbands.

## 2012-03-31 ENCOUNTER — Encounter (HOSPITAL_COMMUNITY): Payer: Self-pay | Admitting: *Deleted

## 2012-03-31 ENCOUNTER — Emergency Department (HOSPITAL_COMMUNITY)
Admission: EM | Admit: 2012-03-31 | Discharge: 2012-03-31 | Disposition: A | Discharge status: Home / Self Care | Payer: Medicare Other | Attending: Emergency Medicine | Admitting: Emergency Medicine

## 2012-03-31 DIAGNOSIS — E7800 Pure hypercholesterolemia, unspecified: Secondary | ICD-10-CM | POA: Insufficient documentation

## 2012-03-31 DIAGNOSIS — I471 Supraventricular tachycardia: Secondary | ICD-10-CM

## 2012-03-31 DIAGNOSIS — Z79899 Other long term (current) drug therapy: Secondary | ICD-10-CM | POA: Insufficient documentation

## 2012-03-31 DIAGNOSIS — E119 Type 2 diabetes mellitus without complications: Secondary | ICD-10-CM | POA: Insufficient documentation

## 2012-03-31 DIAGNOSIS — I10 Essential (primary) hypertension: Secondary | ICD-10-CM | POA: Insufficient documentation

## 2012-03-31 DIAGNOSIS — I498 Other specified cardiac arrhythmias: Secondary | ICD-10-CM | POA: Insufficient documentation

## 2012-03-31 LAB — POCT I-STAT TROPONIN I

## 2012-03-31 MED ORDER — ADENOSINE 6 MG/2ML IV SOLN
INTRAVENOUS | Status: AC
  Administered 2012-03-31: 12 mg via INTRAVENOUS
  Filled 2012-03-31: qty 4

## 2012-03-31 MED ORDER — ADENOSINE 6 MG/2ML IV SOLN
12.0000 mg | Freq: Once | INTRAVENOUS | Status: AC
  Administered 2012-03-31: 12 mg via INTRAVENOUS

## 2012-03-31 NOTE — ED Provider Notes (Cosign Needed)
History     CSN: [id number]  Arrival date & time 03/31/12  0327   First MD Initiated Contact with Patient 03/31/12 [phone]      Chief Complaint  Patient presents with  . Tachycardia    (Consider location/radiation/quality/duration/timing/severity/associated sxs/prior treatment) HPI This 71 year old female has several hours of palpitations with history of SVT. She is no chest pain no shortness of breath no syncope no altered mental status no change in speech vision swallowing or understanding and no localized weakness numbness or paresthesias. She does have some generalized weakness. She has felt like this in the past and converted with adenosine 12 mg or last ED visit. She had recent unremarkable cardiac stress test. She recently ruled out for acute coronary syndrome her last admission for similar symptoms. Past Medical History  Diagnosis Date  . SVT (supraventricular tachycardia)   . Hypertension   . Diabetes mellitus   . Hypercholesteremia   . Neuropathy     Past Surgical History  Procedure Date  . Lumbar laminectomy 08/10    No family history on file.  History  Substance Use Topics  . Smoking status: Never Smoker   . Smokeless tobacco: Never Used  . Alcohol Use: No    OB History    Grav Para Term Preterm Abortions TAB SAB Ect Mult Living                  Review of Systems 10 Systems reviewed and are negative for acute change except as noted in the HPI. Allergies  Phenylephrine hcl; Ultram; Celebrex; Cortisone; Fenofibrate; and Sulfa antibiotics  Home Medications   Current Outpatient Rx  Name Route Sig Dispense Refill  . AMITRIPTYLINE HCL 25 MG PO TABS Oral Take 25 mg by mouth at bedtime.      . ASPIRIN 81 MG PO TABS Oral Take 1 tablet (81 mg total) by mouth daily. 30 tablet   . CALTRATE 600 PO  Take two tablets by mouth daily    . VITAMIN D3 1000 UNITS PO CAPS Oral Take by mouth daily.      Marland Kitchen METFORMIN HCL 1000 MG PO TABS Oral Take 1,000 mg by mouth 2 (two)  times daily.      Marland Kitchen METOPROLOL SUCCINATE ER 50 MG PO TB24 Oral Take 1 tablet (50 mg total) by mouth daily. Take with or immediately following a meal. 30 tablet 0  . MOMETASONE FUROATE 50 MCG/ACT NA SUSP Nasal 2 sprays by Nasal route as needed.      Marland Kitchen ONE-DAILY MULTI VITAMINS PO TABS Oral Take 1 tablet by mouth daily.      Marland Kitchen ROSUVASTATIN CALCIUM 10 MG PO TABS Oral Take 10 mg by mouth daily.      Marland Kitchen VERAPAMIL HCL 40 MG PO TABS Oral Take 1 tablet (40 mg total) by mouth as needed. Take 1 tablet PRN for tachycardia. May take another one in 2 hours if symptoms not resolve. 30 tablet 6  . VITAMIN B-12 500 MCG PO TABS Oral Take 500 mcg by mouth daily.        BP 116/56  Pulse 83  Temp 97.5 F (36.4 C) (Oral)  Resp 12  SpO2 100%  Physical Exam  Nursing note and vitals reviewed. Constitutional:       Awake, alert, nontoxic appearance.  HENT:  Head: Atraumatic.  Eyes: Right eye exhibits no discharge. Left eye exhibits no discharge.  Neck: Neck supple.  Cardiovascular: Regular rhythm.   No murmur heard.  Tachycardia  Pulmonary/Chest: Effort normal and breath sounds normal. No respiratory distress. She has no wheezes. She has no rales. She exhibits no tenderness.  Abdominal: Soft. There is no tenderness. There is no rebound.  Musculoskeletal: She exhibits no edema and no tenderness.       Baseline ROM, no obvious new focal weakness.  Neurological:       Mental status and motor strength appears baseline for patient and situation.  Skin: No rash noted.  Psychiatric: She has a normal mood and affect.    ED Course  Procedures (including critical care time)  Pt converted to NSR with 12mg  adenosine without complications and tolerated conversion well.  0334 ECG: Supraventricular tachycardia, ventricular rate 155, right axis deviation, nonspecific ST and T changes, similar appearance to previous episode of SVT when compared with prior ECG  0358 ECG repeat: Sinus rhythm, ventricular rate 94,  right axis deviation, no acute ischemic changes noted, compared with ECG from 0334 SVT no longer present, nonspecific ST and T wave abnormality no longer present  Labs Reviewed  POCT I-STAT TROPONIN I   No results found.   1. SVT (supraventricular tachycardia)       MDM   Pt stable in ED with no significant deterioration in condition.Patient / Family / Caregiver informed of clinical course, understand medical decision-making process, and agree with plan.    CRITICAL CARE Performed by: Hurman Horn   Total critical care time: 20 minutes  Critical care time was exclusive of separately billable procedures and treating other patients.  Critical care was necessary to treat or prevent imminent or life-threatening deterioration.  Critical care was time spent personally by me on the following activities: development of treatment plan with patient and/or surrogate as well as nursing, discussions with consultants, evaluation of patient's response to treatment, examination of patient, obtaining history from patient or surrogate, ordering and performing treatments and interventions, ordering and review of laboratory studies, ordering and review of radiographic studies, pulse oximetry and re-evaluation of patient's condition.        Hurman Horn, MD 03/31/12 1740

## 2012-03-31 NOTE — ED Notes (Addendum)
C/o fast HR, sudden onset, onset around 2230, sudden onset, also reports some sob and weakness. Denies pain,nv or syncope. HR 155 regular. pt of [place] card. H/o same a "couple of months ago", took verapamil around 2300 w/o change.

## 2012-03-31 NOTE — ED Notes (Signed)
Alert, NAD, calm, interactive, skin W&D, resps e/u, speaking in clear complete setnences.

## 2012-03-31 NOTE — ED Notes (Signed)
Dr. Fonnie Jarvis called to bedside

## 2012-06-02 ENCOUNTER — Telehealth: Payer: Self-pay | Admitting: Internal Medicine

## 2012-06-02 ENCOUNTER — Encounter: Payer: Self-pay | Admitting: Internal Medicine

## 2012-06-02 ENCOUNTER — Ambulatory Visit (INDEPENDENT_AMBULATORY_CARE_PROVIDER_SITE_OTHER): Payer: Medicare Other | Admitting: Internal Medicine

## 2012-06-02 VITALS — BP 140/76 | HR 72 | Ht 63.5 in | Wt 162.8 lb

## 2012-06-02 DIAGNOSIS — I472 Ventricular tachycardia, unspecified: Secondary | ICD-10-CM

## 2012-06-02 DIAGNOSIS — I10 Essential (primary) hypertension: Secondary | ICD-10-CM

## 2012-06-02 DIAGNOSIS — R000 Tachycardia, unspecified: Secondary | ICD-10-CM

## 2012-06-02 DIAGNOSIS — I4729 Other ventricular tachycardia: Secondary | ICD-10-CM

## 2012-06-02 MED ORDER — LISINOPRIL 5 MG PO TABS: 5.0000 mg | ORAL_TABLET | Freq: Every day | ORAL | Status: DC

## 2012-06-02 NOTE — Assessment & Plan Note (Signed)
As above.

## 2012-06-02 NOTE — Telephone Encounter (Signed)
Dr. Graciela Husbands notified and chart updated.

## 2012-06-02 NOTE — Progress Notes (Signed)
Patient Care Team: Ernestina Penna, MD as PCP - General (Family Medicine)   HPI  Kristi Willis is a 71 y.o. female Seen in followup for SVT.  Apparently she'd seen electrophysiology 10-15 years ago and was told she had SVT.   Review of the strips are consistent with atrial tachycardia she was disinclined when we saw her 7/13 to consider ablation and so we undertook, in the context of hypertension, a beta blocker trial. She also has diabetes and so an ACE inhibitor would be appropriate adjunct to therapy  She is taking metoprolol, she thinks tartrate although the MAR says succinate. She has had one recurrent episode since I saw her but none since she was taking this medication. She is not taking long-acting calcium blockers either. She notes some fatigue With the current medication..   Past Medical History  Diagnosis Date  . SVT (supraventricular tachycardia)   . Hypertension   . Diabetes mellitus   . Hypercholesteremia   . Neuropathy     Past Surgical History  Procedure Date  . Lumbar laminectomy 08/10    Current Outpatient Prescriptions  Medication Sig Dispense Refill  . amitriptyline (ELAVIL) 25 MG tablet Take 25 mg by mouth at bedtime.        Marland Kitchen aspirin 81 MG tablet Take 1 tablet (81 mg total) by mouth daily.  30 tablet    . Calcium Carbonate (CALTRATE 600 PO) Take two tablets by mouth daily      . Cholecalciferol (VITAMIN D3) 1000 UNITS CAPS Take by mouth daily.        . Ibuprofen (ADVIL PO) Take 1 tablet by mouth as needed.      . metFORMIN (GLUCOPHAGE) 1000 MG tablet Take 1,000 mg by mouth 2 (two) times daily.        . metoprolol succinate (TOPROL-XL) 50 MG 24 hr tablet Take 1 tablet (50 mg total) by mouth daily. Take with or immediately following a meal.  30 tablet  0  . Multiple Vitamin (MULTIVITAMIN) tablet Take 1 tablet by mouth daily.        . rosuvastatin (CRESTOR) 10 MG tablet Take 10 mg by mouth daily.        . verapamil (CALAN) 40 MG tablet Take 1 tablet (40  mg total) by mouth as needed. Take 1 tablet PRN for tachycardia. May take another one in 2 hours if symptoms not resolve.  30 tablet  6  . vitamin B-12 (CYANOCOBALAMIN) 500 MCG tablet Take 500 mcg by mouth daily.          Allergies  Allergen Reactions  . Phenylephrine Hcl Other (See Comments)    unknown  . Ultram (Tramadol Hcl) Other (See Comments)    Insomnia  . Celebrex (Celecoxib) Rash  . Cortisone Rash  . Fenofibrate Rash  . Sulfa Antibiotics Rash    Review of Systems negative except from HPI and PMH  Physical Exam BP 140/76  Pulse 72  Ht 5' 3.5" (1.613 m)  Wt 162 lb 12.8 oz (73.846 kg)  BMI 28.39 kg/m2 Well developed and well nourished in no acute distress HENT normal E scleral and icterus clear Neck Supple JVP flat; carotids brisk and full Clear to ausculation  Regular rate and rhythm, no murmurs gallops or rub Soft with active bowel sounds No clubbing cyanosis none Edema Alert and oriented, grossly normal motor and sensory function Skin Warm and Dry    Assessment and  Plan

## 2012-06-02 NOTE — Telephone Encounter (Signed)
New Problem:    Patient called in wanting to give you her updated metoprolol succinate (TOPROL-XL) 50 MG 24 hr tablet Taken twice a day.  Please call back if you have any questions.

## 2012-06-02 NOTE — Patient Instructions (Addendum)
Your physician wants you to follow-up in: 6 months with Dr. Graciela Husbands.  You will receive a reminder letter in the mail two months in advance. If you don't receive a letter, please call our office to schedule the follow-up appointment.  Start Lisinopril 5mg  daily.  Call us back at [phone] to verify metoprolol type and dosage.

## 2012-06-02 NOTE — Assessment & Plan Note (Signed)
Blood pressure is still somewhat elevated. With her diabetes she has a compelling indication for ACE inhibitor therapy. It would also be quite liberal in switching her from a beta blocker to calcium blocker. For right now however she would like to continue with the metoprolol and that seems reasonable.  We discussed side effects of ACE inhibitors including hypokalemia, renal dysfunction and angioedema. We will begin her today a lisinopril 5. She will follow up with her PCP in the next 2-3 weeks for blood work. This is important for renal protection.

## 2012-06-02 NOTE — Assessment & Plan Note (Signed)
As above in the event that she continues to have episodes, we will transition for beta blockers to calcium blockers. Thereafter, we consider antiarrhythmic drugs versus catheter ablation

## 2012-06-09 ENCOUNTER — Other Ambulatory Visit: Payer: Self-pay | Admitting: Internal Medicine

## 2012-06-11 ENCOUNTER — Encounter (HOSPITAL_COMMUNITY): Payer: Self-pay | Admitting: Emergency Medicine

## 2012-06-11 ENCOUNTER — Other Ambulatory Visit: Payer: Self-pay

## 2012-06-11 ENCOUNTER — Emergency Department (HOSPITAL_COMMUNITY)
Admission: EM | Admit: 2012-06-11 | Discharge: 2012-06-11 | Disposition: A | Discharge status: Home / Self Care | Payer: Medicare Other | Attending: Emergency Medicine | Admitting: Emergency Medicine

## 2012-06-11 DIAGNOSIS — I498 Other specified cardiac arrhythmias: Secondary | ICD-10-CM | POA: Insufficient documentation

## 2012-06-11 DIAGNOSIS — G589 Mononeuropathy, unspecified: Secondary | ICD-10-CM | POA: Insufficient documentation

## 2012-06-11 DIAGNOSIS — I10 Essential (primary) hypertension: Secondary | ICD-10-CM | POA: Insufficient documentation

## 2012-06-11 DIAGNOSIS — E119 Type 2 diabetes mellitus without complications: Secondary | ICD-10-CM | POA: Insufficient documentation

## 2012-06-11 DIAGNOSIS — I471 Supraventricular tachycardia, unspecified: Secondary | ICD-10-CM

## 2012-06-11 DIAGNOSIS — E7800 Pure hypercholesterolemia, unspecified: Secondary | ICD-10-CM | POA: Insufficient documentation

## 2012-06-11 LAB — BASIC METABOLIC PANEL
CO2: 24 mEq/L (ref 19–32)
Calcium: 9.3 mg/dL (ref 8.4–10.5)
Glucose, Bld: 183 mg/dL — ABNORMAL HIGH (ref 70–99)
Potassium: 3.7 mEq/L (ref 3.5–5.1)
Sodium: 139 mEq/L (ref 135–145)

## 2012-06-11 LAB — CBC WITH DIFFERENTIAL/PLATELET
Basophils Absolute: 0.1 10*3/uL (ref 0.0–0.1)
Eosinophils Relative: 4 % (ref 0–5)
Lymphocytes Relative: 36 % (ref 12–46)
Lymphs Abs: 4.5 10*3/uL — ABNORMAL HIGH (ref 0.7–4.0)
Neutro Abs: 6.2 10*3/uL (ref 1.7–7.7)
Neutrophils Relative %: 50 % (ref 43–77)
Platelets: 257 10*3/uL (ref 150–400)
RBC: 4.5 MIL/uL (ref 3.87–5.11)
RDW: 13.7 % (ref 11.5–15.5)
WBC: 12.4 10*3/uL — ABNORMAL HIGH (ref 4.0–10.5)

## 2012-06-11 MED ORDER — ADENOSINE 6 MG/2ML IV SOLN
12.0000 mg | Freq: Once | INTRAVENOUS | Status: AC
  Administered 2012-06-11: 12 mg via INTRAVENOUS
  Filled 2012-06-11: qty 4

## 2012-06-11 MED ORDER — ADENOSINE 6 MG/2ML IV SOLN
6.0000 mg | Freq: Once | INTRAVENOUS | Status: AC
  Administered 2012-06-11: 6 mg via INTRAVENOUS
  Filled 2012-06-11: qty 2

## 2012-06-11 NOTE — ED Notes (Signed)
Pt discharged.Vital signs stable and GCS 15 

## 2012-06-11 NOTE — ED Notes (Signed)
Pt presented to ED with racing heart since this afternoon.

## 2012-06-11 NOTE — ED Notes (Signed)
PT. REPORTS PALPITATIONS / "HEART RACING" ONSET 2 PM TODAY , DENIES CHEST PAIN OR SOB . PT. STATES SHE TOOK 2 VERAPAMIL TABS PRIOR TO ARRIVAL WITH NO RELIEF.

## 2012-06-15 NOTE — ED Provider Notes (Signed)
History     CSN: [id number]  Arrival date & time 06/11/12  2100   First MD Initiated Contact with Patient 06/11/12 2117      Chief Complaint  Patient presents with  . Palpitations    (Consider location/radiation/quality/duration/timing/severity/associated sxs/prior treatment) Patient is a 71 y.o. female presenting with palpitations. The history is provided by the patient and a relative. No language interpreter was used.  Palpitations  This is a recurrent problem. The current episode started 6 to 12 hours ago. The problem occurs constantly. The problem has not changed since onset.The problem is associated with an unknown factor. On average, each episode lasts 6 hours. Pertinent negatives include no diaphoresis, no fever, no chest pain, no irregular heartbeat, no near-syncope, no syncope, no abdominal pain, no nausea, no vomiting, no headaches, no back pain, no lower extremity edema, no weakness, no cough and no shortness of breath. She has tried deep relaxation (verapamil 40mg  PO x2) for the symptoms. The treatment provided no relief. Risk factors include no known risk factors.    Past Medical History  Diagnosis Date  . SVT (supraventricular tachycardia)   . Hypertension   . Diabetes mellitus   . Hypercholesteremia   . Neuropathy     Past Surgical History  Procedure Date  . Lumbar laminectomy 08/10    No family history on file.  History  Substance Use Topics  . Smoking status: Never Smoker   . Smokeless tobacco: Never Used  . Alcohol Use: No    OB History    Grav Para Term Preterm Abortions TAB SAB Ect Mult Living                  Review of Systems  Constitutional: Negative for fever, chills, diaphoresis, activity change and appetite change.  HENT: Negative for congestion, rhinorrhea, neck pain, neck stiffness and sinus pressure.   Eyes: Negative for discharge and visual disturbance.  Respiratory: Negative for cough, chest tightness, shortness of breath, wheezing  and stridor.   Cardiovascular: Positive for palpitations. Negative for chest pain, leg swelling, syncope and near-syncope.  Gastrointestinal: Negative for nausea, vomiting, abdominal pain, diarrhea and abdominal distention.  Genitourinary: Negative for decreased urine volume and difficulty urinating.  Musculoskeletal: Negative for back pain and arthralgias.  Skin: Negative for color change and pallor.  Neurological: Negative for weakness, light-headedness and headaches.  Psychiatric/Behavioral: Negative for behavioral problems and agitation.  All other systems reviewed and are negative.    Allergies  Phenylephrine hcl; Ultram; Celebrex; Cortisone; Fenofibrate; and Sulfa antibiotics  Home Medications   Current Outpatient Rx  Name Route Sig Dispense Refill  . ALBUTEROL SULFATE 4 MG PO TABS Oral Take 4 mg by mouth 3 (three) times daily.    Marland Kitchen AMITRIPTYLINE HCL 25 MG PO TABS Oral Take 25 mg by mouth at bedtime.      . ASPIRIN 81 MG PO TABS Oral Take 81 mg by mouth 2 (two) times daily.    Marland Kitchen CALCIUM CARBONATE-VITAMIN D 600-400 MG-UNIT PO TABS Oral Take 1 tablet by mouth 2 (two) times daily.    Marland Kitchen VITAMIN D3 1000 UNITS PO CAPS Oral Take by mouth daily.      . IBUPROFEN 200 MG PO TABS Oral Take 400 mg by mouth every 6 (six) hours as needed. For pain    . LISINOPRIL 5 MG PO TABS Oral Take 1 tablet (5 mg total) by mouth daily. 90 tablet 3  . METFORMIN HCL 1000 MG PO TABS Oral Take 1,000 mg by  mouth 2 (two) times daily.      Marland Kitchen METOPROLOL TARTRATE 50 MG PO TABS Oral Take 50 mg by mouth 2 (two) times daily.    Marland Kitchen ONE-DAILY MULTI VITAMINS PO TABS Oral Take 1 tablet by mouth daily.      Marland Kitchen VITAMIN B-6 PO Oral Take 1 tablet by mouth 2 (two) times daily.    Marland Kitchen ROSUVASTATIN CALCIUM 10 MG PO TABS Oral Take 10 mg by mouth daily.      Marland Kitchen VERAPAMIL HCL 40 MG PO TABS Oral Take 40 mg by mouth as needed. For tachycardia. May take another one in 2 hours if symptoms not resolve.    Marland Kitchen VITAMIN B-12 500 MCG PO TABS Oral  Take 500 mcg by mouth daily.        BP 113/49  Pulse 153  Resp 18  SpO2 98%  Physical Exam  Nursing note and vitals reviewed. Constitutional: She is oriented to person, place, and time. She appears well-developed and well-nourished. No distress.  HENT:  Head: Normocephalic and atraumatic.  Mouth/Throat: No oropharyngeal exudate.  Eyes: EOM are normal. Pupils are equal, round, and reactive to light. Right eye exhibits no discharge. Left eye exhibits no discharge.  Neck: Normal range of motion. Neck supple. No JVD present.  Cardiovascular: Regular rhythm and normal heart sounds.        Tachycardic in 150-160s  Pulmonary/Chest: Effort normal and breath sounds normal. No stridor. No respiratory distress. She exhibits no tenderness.  Abdominal: Soft. Bowel sounds are normal. She exhibits no distension. There is no tenderness. There is no guarding.  Musculoskeletal: Normal range of motion. She exhibits no edema and no tenderness.  Neurological: She is alert and oriented to person, place, and time. No cranial nerve deficit. She exhibits normal muscle tone.  Skin: Skin is warm and dry. No rash noted. She is not diaphoretic.  Psychiatric: She has a normal mood and affect. Her behavior is normal. Judgment and thought content normal.    ED Course  Procedures (including critical care time)  Labs Reviewed  CBC WITH DIFFERENTIAL - Abnormal; Notable for the following:    WBC 12.4 (*)     Lymphs Abs 4.5 (*)     Monocytes Absolute 1.1 (*)     All other components within normal limits  BASIC METABOLIC PANEL - Abnormal; Notable for the following:    Glucose, Bld 183 (*)     Creatinine, Ser 0.49 (*)     All other components within normal limits  LAB REPORT - SCANNED   No results found.   1. SVT (supraventricular tachycardia)    EKG c/w SVT.   MDM  Patient present with recurrence of supraventricular tachycardia. This was typical of her past episodes of supraventricular tachycardia. She  had no symptoms concerning for ACS such as chest pain or shortness of breath. She initially did not respond to adenosine 6 mg adenosine 12 mg. on the third dose of adenosine (2md of 12 mg) the patient orders normal sinus rhythm and remained so during her ED stay. Repeat EKG c/w NSR. Doubt ACS, metabolic derangement, thyrotoxicosis. Pt deemed stable for discharge. Return precautions were provided and pt expressed understanding to return to ED if any acute symptoms return. Follow up was instructed which pt also expressed understanding. All questions were answered and pt was in agreement w/ plan.  Patient will followup with cardiologist.        Warrick Parisian, MD 06/15/12 2329

## 2012-06-17 NOTE — ED Provider Notes (Signed)
I have supervised the resident on the management of this patient and agree with the note above. I personally interviewed and examined the patient and my addendum is below.   Kristi Willis is a 71 y.o. female hx of SVT, HTN here with palpitations. Palpitation started on the day of presentation. She tried verapamil but didn't help. No chest pain or SOB. EKG showed SVT rate around 170s. Not hypotensive and no evidence of heart failure. She received adenosine 6mg , 12mg , and 12mg  and she converted back to sinus rhythm. Patient will f/u with her cardiologist outpatient and not concerning for ACS.      Richardean Canal, MD 06/17/12 [phone]

## 2012-10-31 ENCOUNTER — Other Ambulatory Visit: Payer: Self-pay | Admitting: *Deleted

## 2012-10-31 DIAGNOSIS — Z780 Asymptomatic menopausal state: Secondary | ICD-10-CM

## 2012-11-25 ENCOUNTER — Ambulatory Visit: Payer: Medicare Other | Admitting: *Deleted

## 2012-11-25 ENCOUNTER — Other Ambulatory Visit (INDEPENDENT_AMBULATORY_CARE_PROVIDER_SITE_OTHER): Payer: Medicare Other

## 2012-11-25 VITALS — BP 172/62 | HR 68

## 2012-11-25 VITALS — BP 180/67 | HR 60

## 2012-11-25 DIAGNOSIS — E119 Type 2 diabetes mellitus without complications: Secondary | ICD-10-CM

## 2012-11-25 DIAGNOSIS — I10 Essential (primary) hypertension: Secondary | ICD-10-CM

## 2012-11-25 DIAGNOSIS — E559 Vitamin D deficiency, unspecified: Secondary | ICD-10-CM

## 2012-11-25 DIAGNOSIS — E785 Hyperlipidemia, unspecified: Secondary | ICD-10-CM

## 2012-11-25 LAB — COMPREHENSIVE METABOLIC PANEL
ALT: 24 U/L (ref 0–35)
AST: 22 U/L (ref 0–37)
Albumin: 4.4 g/dL (ref 3.5–5.2)
Alkaline Phosphatase: 52 U/L (ref 39–117)
BUN: 9 mg/dL (ref 6–23)
CO2: 28 mEq/L (ref 19–32)
Calcium: 9.4 mg/dL (ref 8.4–10.5)
Chloride: 100 mEq/L (ref 96–112)
Creat: 0.56 mg/dL (ref 0.50–1.10)
Glucose, Bld: 124 mg/dL — ABNORMAL HIGH (ref 70–99)
Potassium: 4.5 mEq/L (ref 3.5–5.3)
Sodium: 138 mEq/L (ref 135–145)
Total Bilirubin: 0.3 mg/dL (ref 0.3–1.2)
Total Protein: 6.7 g/dL (ref 6.0–8.3)

## 2012-11-25 LAB — POCT UA - MICROALBUMIN: Microalbumin Ur, POC: 20 mg/dL

## 2012-11-25 LAB — POCT GLYCOSYLATED HEMOGLOBIN (HGB A1C): Hemoglobin A1C: 7.2

## 2012-11-25 NOTE — Addendum Note (Signed)
Addended by: Prescott Gum on: 11/25/2012 09:00 AM   Modules accepted: Orders

## 2012-11-25 NOTE — Progress Notes (Signed)
Patient came in for labs only and blood pressure recheck

## 2012-11-25 NOTE — Progress Notes (Signed)
Dr Modesto Charon asked for a bp check while here.Also pt needs to come back around lunch time today for another rck. And send him those results to his inbox.

## 2012-11-26 LAB — NMR LIPOPROFILE WITH LIPIDS
Cholesterol, Total: 181 mg/dL (ref <200)
HDL Particle Number: 41.9 umol/L (ref >=30.5)
HDL Size: 8.7 nm — ABNORMAL LOW (ref >=9.2)
HDL-C: 56 mg/dL (ref >=40)
LDL (calc): 85 mg/dL (ref <100)
LDL Particle Number: 1576 nmol/L — ABNORMAL HIGH (ref <1000)
LDL Size: 19.9 nm — ABNORMAL LOW (ref >20.5)
LP-IR Score: 84 — ABNORMAL HIGH (ref <=45)
Large HDL-P: 3.8 umol/L — ABNORMAL LOW (ref >=4.8)
Large VLDL-P: 12.9 nmol/L — ABNORMAL HIGH (ref <=2.7)
Small LDL Particle Number: 1155 nmol/L — ABNORMAL HIGH (ref <=527)
Triglycerides: 202 mg/dL — ABNORMAL HIGH (ref <150)
VLDL Size: 58.1 nm — ABNORMAL HIGH (ref <=46.6)

## 2012-11-26 LAB — VITAMIN D 25 HYDROXY (VIT D DEFICIENCY, FRACTURES): Vit D, 25-Hydroxy: 64 ng/mL (ref 30–89)

## 2012-11-26 LAB — MICROALBUMIN, URINE: Microalb, Ur: 1.45 mg/dL (ref 0.00–1.89)

## 2012-11-27 ENCOUNTER — Encounter: Payer: Self-pay | Admitting: Family Medicine

## 2012-11-27 ENCOUNTER — Ambulatory Visit (INDEPENDENT_AMBULATORY_CARE_PROVIDER_SITE_OTHER): Payer: Medicare Other | Admitting: Family Medicine

## 2012-11-27 VITALS — BP 148/55 | HR 62 | Temp 98.6°F | Ht 64.0 in | Wt 164.8 lb

## 2012-11-27 DIAGNOSIS — M5137 Other intervertebral disc degeneration, lumbosacral region: Secondary | ICD-10-CM

## 2012-11-27 DIAGNOSIS — M5136 Other intervertebral disc degeneration, lumbar region: Secondary | ICD-10-CM

## 2012-11-27 DIAGNOSIS — I10 Essential (primary) hypertension: Secondary | ICD-10-CM

## 2012-11-27 DIAGNOSIS — E785 Hyperlipidemia, unspecified: Secondary | ICD-10-CM

## 2012-11-27 DIAGNOSIS — G5790 Unspecified mononeuropathy of unspecified lower limb: Secondary | ICD-10-CM

## 2012-11-27 DIAGNOSIS — J45909 Unspecified asthma, uncomplicated: Secondary | ICD-10-CM

## 2012-11-27 DIAGNOSIS — E119 Type 2 diabetes mellitus without complications: Secondary | ICD-10-CM

## 2012-11-27 DIAGNOSIS — F439 Reaction to severe stress, unspecified: Secondary | ICD-10-CM | POA: Insufficient documentation

## 2012-11-27 DIAGNOSIS — Z639 Problem related to primary support group, unspecified: Secondary | ICD-10-CM

## 2012-11-27 DIAGNOSIS — M792 Neuralgia and neuritis, unspecified: Secondary | ICD-10-CM

## 2012-11-27 DIAGNOSIS — M51369 Other intervertebral disc degeneration, lumbar region without mention of lumbar back pain or lower extremity pain: Secondary | ICD-10-CM | POA: Insufficient documentation

## 2012-11-27 NOTE — Patient Instructions (Signed)
Diabetes and Foot Care Diabetes may cause you to have a poor blood supply (circulation) to your legs and feet. Because of this, the skin may be thinner, break easier, and heal more slowly. You also may have nerve damage in your legs and feet causing decreased feeling. You may not notice minor injuries to your feet that could lead to serious problems or infections. Taking care of your feet is one of the most important things you can do for yourself.  HOME CARE INSTRUCTIONS  Do not go barefoot. Bare feet are easily injured.  Check your feet daily for blisters, cuts, and redness.  Wash your feet with warm water (not hot) and mild soap. Pat your feet and between your toes until completely dry.  Apply a moisturizing lotion that does not contain alcohol or petroleum jelly to the dry skin on your feet and to dry brittle toenails. Do not put it between your toes.  Trim your toenails straight across. Do not dig under them or around the cuticle.  Do not cut corns or calluses, or try to remove them with medicine.  Wear clean cotton socks or stockings every day. Make sure they are not too tight. Do not wear knee high stockings since they may decrease blood flow to your legs.  Wear leather shoes that fit properly and have enough cushioning. To break in new shoes, wear them just a few hours a day to avoid injuring your feet.  Wear shoes at all times, even in the house.  Do not cross your legs. This may decrease the blood flow to your feet.  If you find a minor scrape, cut, or break in the skin on your feet, keep it and the skin around it clean and dry. These areas may be cleansed with mild soap and water. Do not use peroxide, alcohol, iodine or Merthiolate.  When you remove an adhesive bandage, be sure not to harm the skin around it.  If you have a wound, look at it several times a day to make sure it is healing.  Do not use heating pads or hot water bottles. Burns can occur. If you have lost feeling  in your feet or legs, you may not know it is happening until it is too late.  Report any cuts, sores or bruises to your caregiver. Do not wait! SEEK MEDICAL CARE IF:   You have an injury that is not healing or you notice redness, numbness, burning, or tingling.  Your feet always feel cold.  You have pain or cramps in your legs and feet. SEEK IMMEDIATE MEDICAL CARE IF:   There is increasing redness, swelling, or increasing pain in the wound.  There is a red line that goes up your leg.  Pus is coming from a wound.  You develop an unexplained oral temperature above 102 F (38.9 C), or as your caregiver suggests.  You notice a bad smell coming from an ulcer or wound. MAKE SURE YOU:   Understand these instructions.  Will watch your condition.  Will get help right away if you are not doing well or get worse. Document Released: 08/02/2000 Document Revised: 10/28/2011 Document Reviewed: 02/08/2009 [facility] Patient Information 2013 [place], [place].  Diabetes, Type 2 Diabetes is a long-lasting (chronic) disease. In type 2 diabetes, the pancreas does not make enough insulin (a hormone), and the body does not respond normally to the insulin that is made. This type of diabetes was also previously called adult-onset diabetes. It usually occurs after the  age of 50, but it can occur at any age.  CAUSES  Type 2 diabetes happens because the pancreasis not making enough insulin or your body has trouble using the insulin that your pancreas does make properly. SYMPTOMS   Drinking more than usual.  Urinating more than usual.  Blurred vision.  Dry, itchy skin.  Frequent infections.  Feeling more tired than usual (fatigue). DIAGNOSIS The diagnosis of type 2 diabetes is usually made by one of the following tests:  Fasting blood glucose test. You will not eat for at least 8 hours and then take a blood test.  Random blood glucose test. Your blood glucose (sugar) is checked at any time of  the day regardless of when you ate.  Oral glucose tolerance test (OGTT). Your blood glucose is measured after you have not eaten (fasted) and then after you drink a glucose containing beverage. TREATMENT   Healthy eating.  Exercise.  Medicine, if needed.  Monitoring blood glucose.  Seeing your caregiver regularly. HOME CARE INSTRUCTIONS   Check your blood glucose at least once a day. More frequent monitoring may be necessary, depending on your medicines and on how well your diabetes is controlled. Your caregiver will advise you.  Take your medicine as directed by your caregiver.  Do not smoke.  Make wise food choices. Ask your caregiver for information. Weight loss can improve your diabetes.  Learn about low blood glucose (hypoglycemia) and how to treat it.  Get your eyes checked regularly.  Have a yearly physical exam. Have your blood pressure checked and your blood and urine tested.  Wear a pendant or bracelet saying that you have diabetes.  Check your feet every night for cuts, sores, blisters, and redness. Let your caregiver know if you have any problems. SEEK MEDICAL CARE IF:   You have problems keeping your blood glucose in target range.  You have problems with your medicines.  You have symptoms of an illness that do not improve after 24 hours.  You have a sore or wound that is not healing.  You notice a change in vision or a new problem with your vision.  You have a fever. MAKE SURE YOU:  Understand these instructions.  Will watch your condition.  Will get help right away if you are not doing well or get worse. Document Released: 08/05/2005 Document Revised: 10/28/2011 Document Reviewed: 01/21/2011 [facility] Patient Information 2013 [place], [place].   For nutrition information, I recommend books: Eat to Live by Dr Monico Hoar. Prevent and Reverse Heart Disease by Dr Suzzette Righter.  Exercise recommendations are:  If unable to walk, then the  patient can exercise in a chair 3 times a day. By flapping arms like a bird gently and raising legs outwards to the front.  If ambulatory, the patient can go for walks for 30 minutes 3 times a week. Then increase the intensity and duration as tolerated. Goal is to try to attain exercise frequency to 5 times a week. Best to perform resistance exercises 2 days a week and cardio type exercises 3 days per week.

## 2012-11-27 NOTE — Progress Notes (Signed)
Patient ID: Kristi Willis, female   DOB: 02-Mar-1941, 72 y.o.   MRN: [id number] SUBJECTIVE:   HPI: Just completed prednisone. For her back pain. Seen by orthopedics. It did raise her blood sugar. Patient is here for follow up of hyperlipidemia: deniesHeadache;deniesChest Pain;deniesweakness;deniesShortness of Breath and orthopnea;deniesVisual changes;deniespalpitations;deniescough;deniespedal edema;deniessymptoms of TIA or stroke;deniesClaudication symptoms. admits toCompliance with medications; deniesProblems with medications. Blood pressure is running better now. The medication adjustments made at last visit. External high here because of her stress. When she checks it at home blood pressures is good. Her diabetes has been uncontrolled because of the prednisone. But it is much better now and almost back to normal.   PMH/PSH: reviewed/updated in Epic  SH/FH: reviewed/updated in Epic  Allergies: reviewed/updated in Epic  Medications: reviewed/updated in Epic  Immunizations: reviewed/updated in Epic     ROS: As above in the HPI. All other systems are stable or negative.  OBJECTIVE:    Pleasant white female On examination she appeared in good health and spirits. Vital signs as documented. BP 148/55  Pulse 62  Temp(Src) 98.6 F (37 C) (Oral)  Ht 5\' 4"  (1.626 m)  Wt 164 lb 12.8 oz (74.753 kg)  BMI 28.27 kg/m2  Skin warm and dry and without overt rashes.  Head, Eyes, Ears, throat: normal Neck without JVD.  Lungs clear.  Heart exam notable for regular rhythm, normal sounds and absence of murmurs, rubs or gallops. Abdomen unremarkable and without evidence of organomegaly, masses, or abdominal aortic enlargement.  Extremities nonedematous. Neurologic: nonfocal Spine decreased range of motion due to discomfort  ASSESSMENT:  Diabetes mellitus, type 2 - Plan: POCT glycosylated hemoglobin (Hb A1C)  Foot neuralgia, unspecified laterality  Hypertension - Plan: COMPLETE  METABOLIC PANEL WITH GFR  Hyperlipidemia - Plan: COMPLETE METABOLIC PANEL WITH GFR, NMR Lipoprofile with Lipids  Asthma  Stress at home  Degenerative disc disease, lumbar    PLAN: Annual eye exam last one was 05/2012.  Diet and Exercise discussed with patient. For nutrition information, I recommend books: Eat to Live by Dr Monico Hoar. Prevent and Reverse Heart Disease by Dr Suzzette Righter.  Exercise recommendations are:  If unable to walk, then the patient can exercise in a chair 3 times a day. By flapping arms like a bird gently and raising legs outwards to the front.  If ambulatory, the patient can go for walks for 30 minutes 3 times a week. Then increase the intensity and duration as tolerated. Goal is to try to attain exercise frequency to 5 times a week. Best to perform resistance exercises 2 days a week and cardio type exercises 3 days per week. Orders Placed This Encounter  Procedures  . COMPLETE METABOLIC PANEL WITH GFR    Standing Status: Future     Number of Occurrences:      Standing Expiration Date: 11/27/2013  . NMR Lipoprofile with Lipids    Standing Status: Future     Number of Occurrences:      Standing Expiration Date: 11/27/2013  . POCT glycosylated hemoglobin (Hb A1C)    To be done in 3 months,   No results found for this or any previous visit (from the past 24 hour(s)). No orders of the defined types were placed in this encounter.   reviewed her labs done recently. Hemoglobin A1c was 7.2. Other labs reviewed. Patient opted to continue with diet and exercise and get back on track. Did not want to adjust medicines. Stress reduction. Supportive therapy. Reviewed goals for the patient  for her diseases.  Velena Keegan P. Modesto Charon, M.D.

## 2012-11-27 NOTE — Progress Notes (Signed)
Quick Note:  Labs abnormal. HGBA1C is elevated, BP was elevated, lipids not at goal. Needs an office visit to recheck BP next week and to review labs and plan on adjusting medications. ______

## 2012-11-30 NOTE — Progress Notes (Signed)
BP check routed to Dr Modesto Charon on 11/25/12. Had F/U visit on 11/27/12.

## 2012-12-09 ENCOUNTER — Ambulatory Visit (INDEPENDENT_AMBULATORY_CARE_PROVIDER_SITE_OTHER): Payer: Medicare Other

## 2012-12-09 ENCOUNTER — Other Ambulatory Visit: Payer: Self-pay

## 2012-12-09 ENCOUNTER — Encounter: Payer: Self-pay | Admitting: Pharmacist

## 2012-12-09 ENCOUNTER — Ambulatory Visit (INDEPENDENT_AMBULATORY_CARE_PROVIDER_SITE_OTHER): Payer: Medicare Other | Admitting: Pharmacist

## 2012-12-09 VITALS — Ht 64.0 in | Wt 167.0 lb

## 2012-12-09 DIAGNOSIS — Z79899 Other long term (current) drug therapy: Secondary | ICD-10-CM

## 2012-12-09 DIAGNOSIS — Z780 Asymptomatic menopausal state: Secondary | ICD-10-CM

## 2012-12-09 DIAGNOSIS — Z13820 Encounter for screening for osteoporosis: Secondary | ICD-10-CM

## 2012-12-09 NOTE — Progress Notes (Signed)
Patient ID: Kristi Willis, female   DOB: 18-Jul-1941, 72 y.o.   MRN: [id number]   [facility] Current Height: Height: 5\' 4"  (162.6 cm)      Max Lifetime Height:  5\' 4"  Current Weight: Weight: 167 lb (75.751 kg)       Ethnicity:Caucasian    HPI: Does pt already have a diagnosis of:  Osteopenia?  No Osteoporosis?  No  Back Pain?  Yes - h/o back surgery about 4 years ago.   Patient has DDD. Kyphosis?  No Prior fracture?  No Med(s) for Osteoporosis/Osteopenia:  none Med(s) previously tried for Osteoporosis/Osteopenia:  None  Reviewed and updated all patient's medications.  It appears patient's electronic record has not been updated since lisinopril was increased on 08/14/2012 and 08/31/2012.  Her paper chart shows lisinopril 20mg .  I called patient's pharmacy and confirmed that she has been taking lisinopril 20mg  1 tablet daily.                                                               PMH: Age at menopause:  72 yo Hysterectomy?  No Oophorectomy?  No HRT? Yes - Former.  Type/duration: estrogen - 7-8 years Steroid Use?  No Thyroid med?  No History of cancer?  Yes  - skin cancer History of digestive disorders (ie Crohn's)?  No Current or previous eating disorders?  No Last Vitamin D Result:  68 (02/2011) Last GFR Result:  >89 (07/2012)   FH/SH: Family history of osteoporosis?  Yes -both sisters Parent with history of hip fracture?  No Family history of breast cancer?  Yes - sister Exercise?  No - limited by leg and back pain currently but usually walks daily. Caffeine?  No Smoking?  No Alcohol?  No    Calcium Assessment Calcium Intake  # of servings/day  Calcium mg  Milk (8 oz) 1 (silk milk)  x  300  = 300mg   Yogurt (4 oz) 0 x  200 =   Cheese (1 oz) 0 x  200 =   Other Calcium sources     Ca supplement 600mg  bid and MVI = 1600mg    Estimated calcium intake per day 1900mg     DEXA Results Date of Test T-Score for AP Spine L1-L4 T-Score for Total Left Hip  T-Score for Total Right Hip  12/09/2012 + 2.3 + 1.8 + 2.0                  Assessment: Normal BMD Medication review   Recommendations: continue calcium 1200mg  daily either through supplementation or diet.   recommend weight bearing exercise - 30 minutes at least 4 days per week.  Patient to look into [employer] and educated about fall risk and prevention Patient's medication list updated and reviewed with patient. Recheck DEXA:  5 years  Time spent counseling patient:  15 minutes

## 2012-12-25 ENCOUNTER — Encounter: Payer: Self-pay | Admitting: Family Medicine

## 2012-12-25 ENCOUNTER — Ambulatory Visit (INDEPENDENT_AMBULATORY_CARE_PROVIDER_SITE_OTHER): Payer: Medicare Other | Admitting: Family Medicine

## 2012-12-25 VITALS — BP 146/66 | HR 66 | Temp 97.1°F

## 2012-12-25 DIAGNOSIS — Z639 Problem related to primary support group, unspecified: Secondary | ICD-10-CM

## 2012-12-25 DIAGNOSIS — F439 Reaction to severe stress, unspecified: Secondary | ICD-10-CM

## 2012-12-25 DIAGNOSIS — E119 Type 2 diabetes mellitus without complications: Secondary | ICD-10-CM

## 2012-12-25 DIAGNOSIS — I10 Essential (primary) hypertension: Secondary | ICD-10-CM

## 2012-12-25 DIAGNOSIS — E785 Hyperlipidemia, unspecified: Secondary | ICD-10-CM

## 2012-12-25 MED ORDER — LISINOPRIL 30 MG PO TABS: 30.0000 mg | ORAL_TABLET | Freq: Every day | ORAL | Status: DC

## 2012-12-25 MED ORDER — ROSUVASTATIN CALCIUM 20 MG PO TABS: 20.0000 mg | ORAL_TABLET | Freq: Every day | ORAL | Status: DC

## 2012-12-25 NOTE — Progress Notes (Signed)
Patient ID: Kristi Willis, female   DOB: 08-May-1941, 71 y.o.   MRN: [id number] SUBJECTIVE: HPI: Was seen recently. Appointment is to review labs and  Discuss options to adjust meds. DM: the HGBA1c was elevated and not at goal, but patient says her sugars are much better now. The sugars were high due to steroid injections.  HTN:SBP still runs in 140s.  HLD: on the crestor , no problems.  PMH/PSH: reviewed/updated in Epic  SH/FH: reviewed/updated in Epic  Allergies: reviewed/updated in Epic  Medications: reviewed/updated in Epic  Immunizations: reviewed/updated in Epic  ROS: As above in the HPI. All other systems are stable or negative.  OBJECTIVE: APPEARANCE:  Patient in no acute distress.The patient appeared well nourished and normally developed. Acyanotic. Waist: VITAL SIGNS:BP 146/66  Pulse 66  Temp(Src) 97.1 F (36.2 C) (Oral) Recheck BP and it was 146/65  SKIN: warm and  Dry without overt rashes, tattoos and scars  HEAD and Neck: without JVD, Head and scalp: normal Eyes:No scleral icterus. Fundi normal, eye movements normal. Ears: Auricle normal, canal normal, Tympanic membranes normal, insufflation normal. Nose: normal Throat: normal Neck & thyroid: normal  CHEST & LUNGS: Chest wall: normal Lungs: Clear  CVS: Reveals the PMI to be normally located. Regular rhythm, First and Second Heart sounds are normal,  absence of murmurs, rubs or gallops. Peripheral vasculature: Radial pulses: normal Dorsal pedis pulses: normal Posterior pulses: normal  ABDOMEN:  Appearance: normal Benign,, no organomegaly, no masses, no Abdominal Aortic enlargement. No Guarding , no rebound. No Bruits. Bowel sounds: normal  RECTAL: N/A GU: N/A  EXTREMETIES: nonedematous. Both Femoral and Pedal pulses are normal.  MUSCULOSKELETAL:  Spine: normal Joints: intact  NEUROLOGIC: oriented to time,place and person; nonfocal. Strength is normal Sensory is normal Reflexes  are normal Cranial Nerves are normal.  ASSESSMENT: Hypertension - Plan: lisinopril (PRINIVIL,ZESTRIL) 30 MG tablet  Hyperlipidemia - Plan: rosuvastatin (CRESTOR) 20 MG tablet  Diabetes mellitus, type 2  Stress at home  Stress reduction. Discussed options and she prefers to wait on the DM med adjustments.  PLAN: Meds ordered this encounter  Medications  . rosuvastatin (CRESTOR) 20 MG tablet    Sig: Take 1 tablet (20 mg total) by mouth daily.    Dispense:  30 tablet    Refill:  5  . lisinopril (PRINIVIL,ZESTRIL) 30 MG tablet    Sig: Take 1 tablet (30 mg total) by mouth daily.    Dispense:  30 tablet    Refill:  5   No orders of the defined types were placed in this encounter.   Results for orders placed in visit on 11/25/12  COMPREHENSIVE METABOLIC PANEL      Result Value Range   Sodium 138  135 - 145 mEq/L   Potassium 4.5  3.5 - 5.3 mEq/L   Chloride 100  96 - 112 mEq/L   CO2 28  19 - 32 mEq/L   Glucose, Bld 124 (*) 70 - 99 mg/dL   BUN 9  6 - 23 mg/dL   Creat 4.09  8.11 - 9.14 mg/dL   Total Bilirubin 0.3  0.3 - 1.2 mg/dL   Alkaline Phosphatase 52  39 - 117 U/L   AST 22  0 - 37 U/L   ALT 24  0 - 35 U/L   Total Protein 6.7  6.0 - 8.3 g/dL   Albumin 4.4  3.5 - 5.2 g/dL   Calcium 9.4  8.4 - 78.2 mg/dL  VITAMIN D 25 HYDROXY  Result Value Range   Vit D, 25-Hydroxy 64  30 - 89 ng/mL  NMR LIPOPROFILE WITH LIPIDS      Result Value Range   LDL Particle Number [id number] (*) <1000 nmol/L   LDL (calc) 85  <100 mg/dL   HDL-C 56  >=16 mg/dL   Triglycerides 109 (*) <150 mg/dL   Cholesterol, Total 604  <200 mg/dL   HDL Particle Number 54.0  >=98.1 umol/L   Large HDL-P 3.8 (*) >=4.8 umol/L   Large VLDL-P 12.9 (*) <=2.7 nmol/L   Small LDL Particle Number [id number] (*) <=527 nmol/L   LDL Size 19.9 (*) >20.5 nm   HDL Size 8.7 (*) >=9.2 nm   VLDL Size 58.1 (*) <=46.6 nm   LP-IR Score 84 (*) <=45  MICROALBUMIN, URINE      Result Value Range   Microalb, Ur 1.45  0.00 - 1.89 mg/dL   POCT GLYCOSYLATED HEMOGLOBIN (HGB A1C)      Result Value Range   Hemoglobin A1C 7.2    POCT UA - MICROALBUMIN      Result Value Range   Microalbumin Ur, POC 20     Creatinine, POC       Albumin/Creatinine Ratio, Urine, POC       RTC in 6 weeks for BP check   Marlina Cataldi P. [place] Charon, M.D.

## 2013-01-08 ENCOUNTER — Other Ambulatory Visit: Payer: Self-pay | Admitting: Family Medicine

## 2013-02-02 ENCOUNTER — Ambulatory Visit: Payer: Medicare Other | Admitting: Family Medicine

## 2013-02-08 ENCOUNTER — Telehealth: Payer: Self-pay | Admitting: *Deleted

## 2013-02-08 MED ORDER — LISINOPRIL-HYDROCHLOROTHIAZIDE 20-12.5 MG PO TABS: 2.0000 | ORAL_TABLET | Freq: Every day | ORAL | Status: DC

## 2013-02-08 NOTE — Telephone Encounter (Signed)
Pt's BP running in all ranges of 170- 180 over 60- 70  Pt sees dr Jodell Cipro 02/16/13 (cardio) And wong 03/02/13  Pt wonders if she can add another pill or something- worring about it. Keeps a HA most days  [employer]

## 2013-02-08 NOTE — Telephone Encounter (Signed)
Patient aware of medication changes and will pick up RX at pharmacy

## 2013-02-16 ENCOUNTER — Encounter: Payer: Self-pay | Admitting: Internal Medicine

## 2013-02-16 ENCOUNTER — Ambulatory Visit (INDEPENDENT_AMBULATORY_CARE_PROVIDER_SITE_OTHER): Payer: Medicare Other | Admitting: Internal Medicine

## 2013-02-16 VITALS — BP 118/62 | HR 68 | Ht 63.0 in | Wt 164.8 lb

## 2013-02-16 DIAGNOSIS — I10 Essential (primary) hypertension: Secondary | ICD-10-CM

## 2013-02-16 DIAGNOSIS — R000 Tachycardia, unspecified: Secondary | ICD-10-CM

## 2013-02-16 DIAGNOSIS — I4729 Other ventricular tachycardia: Secondary | ICD-10-CM

## 2013-02-16 DIAGNOSIS — I472 Ventricular tachycardia: Secondary | ICD-10-CM

## 2013-02-16 NOTE — Progress Notes (Signed)
Patient Care Team: Ernestina Penna, MD as PCP - General (Family Medicine)   HPI  Kristi Willis is a 72 y.o. female Seen in followup for SVT consistent with atrial tachycardia for which she was disinclined when we saw her 7/13 to consider ablation and so we undertook, in the context of hypertension, a beta blocker trial.  She also has diabetes and so an ACE inhibitor would be appropriate adjunct to therapy  She was struggling with high blood pressure associated with headaches and her beta blocker was increased; metoprolol succinate 50--200. She is tolerated. There is no significant fatigue. She has noted little bit of weight gain.  There is no edema or chest pain.      Past Medical History  Diagnosis Date  . SVT (supraventricular tachycardia)   . Hypertension   . Diabetes mellitus   . Hypercholesteremia   . Neuropathy     Past Surgical History  Procedure Laterality Date  . Lumbar laminectomy  08/10    Current Outpatient Prescriptions  Medication Sig Dispense Refill  . albuterol (VENTOLIN HFA) 108 (90 BASE) MCG/ACT inhaler Inhale 2 puffs into the lungs every 6 (six) hours as needed for wheezing.      Marland Kitchen amitriptyline (ELAVIL) 25 MG tablet Take 25 mg by mouth at bedtime.        . Calcium Carbonate-Vitamin D (CALTRATE 600+D) 600-400 MG-UNIT per tablet Take 1 tablet by mouth 2 (two) times daily.      . Cholecalciferol (VITAMIN D3) 1000 UNITS CAPS Take by mouth daily.        Marland Kitchen ibuprofen (ADVIL,MOTRIN) 200 MG tablet Take 400 mg by mouth every 6 (six) hours as needed. For pain      . lisinopril (PRINIVIL,ZESTRIL) 30 MG tablet Take 1 tablet (30 mg total) by mouth daily.  30 tablet  5  . lisinopril-hydrochlorothiazide (ZESTORETIC) 20-12.5 MG per tablet Take 2 tablets by mouth daily.  60 tablet  1  . metFORMIN (GLUCOPHAGE) 1000 MG tablet Take 1,000 mg by mouth 2 (two) times daily.        . metoprolol succinate (TOPROL-XL) 100 MG 24 hr tablet Take 100 mg by mouth 2 (two) times daily.  Take with or immediately following a meal.      . Multiple Vitamin (MULTIVITAMIN) tablet Take 1 tablet by mouth daily.        . Pyridoxine HCl (VITAMIN B-6 PO) Take 1 tablet by mouth 2 (two) times daily.      . rosuvastatin (CRESTOR) 20 MG tablet Take 1 tablet (20 mg total) by mouth daily.  30 tablet  5  . vitamin B-12 (CYANOCOBALAMIN) 500 MCG tablet Take 500 mcg by mouth daily.         No current facility-administered medications for this visit.    Allergies  Allergen Reactions  . Dimetapp C (Phenylephrine-Bromphen-Codeine) Itching  . Phenylephrine Hcl Other (See Comments)    unknown  . Ultram (Tramadol Hcl) Other (See Comments)    Insomnia  . Celebrex (Celecoxib) Rash  . Cortisone Rash  . Fenofibrate Rash  . Sulfa Antibiotics Rash    Review of Systems negative except from HPI and PMH  Physical Exam BP 118/62  Pulse 68  Ht 5\' 3"  (1.6 m)  Wt 164 lb 12.8 oz (74.753 kg)  BMI 29.2 kg/m2 Well developed and nourished in no acute distress HENT normal Neck supple with JVP-flat Clear Regular rate and rhythm, no murmurs or gallops Abd-soft with active BS No Clubbing cyanosis edema Skin-warm  and dry A & Oriented  Grossly normal sensory and motor function sixths   Assessment and  Plan

## 2013-02-16 NOTE — Patient Instructions (Signed)
Your physician wants you to follow-up in: 1 year with Dr. Klein. You will receive a reminder letter in the mail two months in advance. If you don't receive a letter, please call our office to schedule the follow-up appointment.  Your physician recommends that you continue on your current medications as directed. Please refer to the Current Medication list given to you today.  

## 2013-02-16 NOTE — Assessment & Plan Note (Signed)
Much improved on metoprolol.

## 2013-02-16 NOTE — Assessment & Plan Note (Signed)
No recurrent arrhythmias to speak of. We'll continue the metoprolol which she seems to be tolerating not withstanding the high dose

## 2013-02-23 ENCOUNTER — Other Ambulatory Visit (INDEPENDENT_AMBULATORY_CARE_PROVIDER_SITE_OTHER): Payer: Medicare Other

## 2013-02-23 DIAGNOSIS — I10 Essential (primary) hypertension: Secondary | ICD-10-CM

## 2013-02-23 DIAGNOSIS — E119 Type 2 diabetes mellitus without complications: Secondary | ICD-10-CM

## 2013-02-23 DIAGNOSIS — E785 Hyperlipidemia, unspecified: Secondary | ICD-10-CM

## 2013-02-23 LAB — COMPLETE METABOLIC PANEL WITH GFR
ALT: 34 U/L (ref 0–35)
AST: 36 U/L (ref 0–37)
Albumin: 4.4 g/dL (ref 3.5–5.2)
Alkaline Phosphatase: 55 U/L (ref 39–117)
BUN: 9 mg/dL (ref 6–23)
CO2: 28 mEq/L (ref 19–32)
Calcium: 9.5 mg/dL (ref 8.4–10.5)
Chloride: 90 mEq/L — ABNORMAL LOW (ref 96–112)
Creat: 0.55 mg/dL (ref 0.50–1.10)
GFR, Est African American: >89 mL/min
GFR, Est Non African American: >89 mL/min
Glucose, Bld: 132 mg/dL — ABNORMAL HIGH (ref 70–99)
Potassium: 4.2 mEq/L (ref 3.5–5.3)
Sodium: 129 mEq/L — ABNORMAL LOW (ref 135–145)
Total Bilirubin: 0.4 mg/dL (ref 0.3–1.2)
Total Protein: 6.6 g/dL (ref 6.0–8.3)

## 2013-02-23 LAB — POCT GLYCOSYLATED HEMOGLOBIN (HGB A1C): Hemoglobin A1C: 7.2

## 2013-02-23 NOTE — Progress Notes (Signed)
Pt came in for labs only 

## 2013-02-24 LAB — NMR LIPOPROFILE WITH LIPIDS
Cholesterol, Total: 177 mg/dL (ref <200)
HDL Particle Number: 39.2 umol/L (ref >=30.5)
HDL Size: 9.1 nm — ABNORMAL LOW (ref >=9.2)
HDL-C: 48 mg/dL (ref >=40)
LDL (calc): 70 mg/dL (ref <100)
LDL Particle Number: 1506 nmol/L — ABNORMAL HIGH (ref <1000)
LDL Size: 19.6 nm — ABNORMAL LOW (ref >20.5)
LP-IR Score: 89 — ABNORMAL HIGH (ref <=45)
Large HDL-P: 4.6 umol/L — ABNORMAL LOW (ref >=4.8)
Large VLDL-P: 21.6 nmol/L — ABNORMAL HIGH (ref <=2.7)
Small LDL Particle Number: 1015 nmol/L — ABNORMAL HIGH (ref <=527)
Triglycerides: 293 mg/dL — ABNORMAL HIGH (ref <150)
VLDL Size: 69.4 nm — ABNORMAL HIGH (ref <=46.6)

## 2013-02-28 NOTE — Progress Notes (Signed)
Quick Note:  Labs abnormal. Sodium is too low and the HGBA1C is still not at goal. Patient needs office visit and is to bring all medicines this week to make adjustments and recommendations and need to recheck the sodium level. Limit fluids to 1.5 liters per day. ______

## 2013-03-02 ENCOUNTER — Other Ambulatory Visit: Payer: Self-pay | Admitting: Family Medicine

## 2013-03-02 ENCOUNTER — Ambulatory Visit (INDEPENDENT_AMBULATORY_CARE_PROVIDER_SITE_OTHER): Payer: Medicare Other | Admitting: Family Medicine

## 2013-03-02 ENCOUNTER — Encounter: Payer: Self-pay | Admitting: Family Medicine

## 2013-03-02 VITALS — BP 173/67 | HR 70 | Temp 98.3°F | Ht 64.0 in | Wt 166.6 lb

## 2013-03-02 DIAGNOSIS — J45909 Unspecified asthma, uncomplicated: Secondary | ICD-10-CM

## 2013-03-02 DIAGNOSIS — M5137 Other intervertebral disc degeneration, lumbosacral region: Secondary | ICD-10-CM

## 2013-03-02 DIAGNOSIS — F439 Reaction to severe stress, unspecified: Secondary | ICD-10-CM

## 2013-03-02 DIAGNOSIS — E871 Hypo-osmolality and hyponatremia: Secondary | ICD-10-CM

## 2013-03-02 DIAGNOSIS — M5136 Other intervertebral disc degeneration, lumbar region: Secondary | ICD-10-CM

## 2013-03-02 DIAGNOSIS — M51369 Other intervertebral disc degeneration, lumbar region without mention of lumbar back pain or lower extremity pain: Secondary | ICD-10-CM

## 2013-03-02 DIAGNOSIS — R0989 Other specified symptoms and signs involving the circulatory and respiratory systems: Secondary | ICD-10-CM

## 2013-03-02 DIAGNOSIS — G589 Mononeuropathy, unspecified: Secondary | ICD-10-CM

## 2013-03-02 DIAGNOSIS — G629 Polyneuropathy, unspecified: Secondary | ICD-10-CM

## 2013-03-02 DIAGNOSIS — E785 Hyperlipidemia, unspecified: Secondary | ICD-10-CM

## 2013-03-02 DIAGNOSIS — Z639 Problem related to primary support group, unspecified: Secondary | ICD-10-CM

## 2013-03-02 DIAGNOSIS — I10 Essential (primary) hypertension: Secondary | ICD-10-CM

## 2013-03-02 DIAGNOSIS — E119 Type 2 diabetes mellitus without complications: Secondary | ICD-10-CM

## 2013-03-02 DIAGNOSIS — M51379 Other intervertebral disc degeneration, lumbosacral region without mention of lumbar back pain or lower extremity pain: Secondary | ICD-10-CM

## 2013-03-02 LAB — BASIC METABOLIC PANEL WITH GFR
BUN: 8 mg/dL (ref 6–23)
CO2: 25 mEq/L (ref 19–32)
Calcium: 9.4 mg/dL (ref 8.4–10.5)
Chloride: 91 mEq/L — ABNORMAL LOW (ref 96–112)
Creat: 0.54 mg/dL (ref 0.50–1.10)
GFR, Est African American: >89 mL/min
GFR, Est Non African American: >89 mL/min
Glucose, Bld: 134 mg/dL — ABNORMAL HIGH (ref 70–99)
Potassium: 4.5 mEq/L (ref 3.5–5.3)
Sodium: 128 mEq/L — ABNORMAL LOW (ref 135–145)

## 2013-03-02 MED ORDER — SITAGLIPTIN PHOSPHATE 100 MG PO TABS: 100.0000 mg | ORAL_TABLET | Freq: Every day | ORAL | Status: DC

## 2013-03-02 MED ORDER — AMITRIPTYLINE HCL 25 MG PO TABS: 25.0000 mg | ORAL_TABLET | Freq: Every day | ORAL | Status: DC

## 2013-03-02 MED ORDER — METFORMIN HCL 1000 MG PO TABS: 1000.0000 mg | ORAL_TABLET | Freq: Two times a day (BID) | ORAL | Status: DC

## 2013-03-02 MED ORDER — METOPROLOL SUCCINATE ER 100 MG PO TB24: 100.0000 mg | ORAL_TABLET | Freq: Two times a day (BID) | ORAL | Status: DC

## 2013-03-02 MED ORDER — AMLODIPINE BESYLATE 5 MG PO TABS: 5.0000 mg | ORAL_TABLET | Freq: Every day | ORAL | Status: DC

## 2013-03-02 NOTE — Progress Notes (Signed)
Patient ID: Kristi Willis, female   DOB: 08-Aug-1941, 72 y.o.   MRN: [id number] SUBJECTIVE: CC: Chief Complaint  Patient presents with  . Follow-up    3 month follow up states bp going up refills needed   HPI: 1)Patient is here for follow up of hypertension/hyperlipidemia: Was having Headache and this has resolved with an adjustment to her BP meds. ;deniesChest Pain;denies weakness;denies Shortness of Breath or Orthopnea;denies Visual changes;denies palpitations;denies cough;denies pedal edema;denies symptoms of TIA or stroke; admits to Compliance with medications. denies Problems with medications.  2)Patient is here for follow up of Diabetes Mellitus: Symptoms of DM: Denies Nocturia ,Denies Urinary Frequency , denies Blurred vision ,deniesDizziness,denies.Dysuria, no change in the paresthesias, and extremity pain from the neuropathy,  no ulcers.Marland Kitchendenies chest pain. has had an annual eye exam.05/2012 do check the feet. Does check CBGs. Average CBG: high Denies episodes of hypoglycemia. Does have an emergency hypoglycemic plan. admits toCompliance with medications. Denies Problems with medications.  Breakfast: most of the time  Cereal, occasional biscuit,milk/soymilk Lunch: not looking at what she eats Supper: once in a while cereal. Eat out.  3)DDD: back doing fairly good.  Past Medical History  Diagnosis Date  . SVT (supraventricular tachycardia)   . Hypertension   . Diabetes mellitus   . Hypercholesteremia   . Neuropathy   . Asthma   . GERD (gastroesophageal reflux disease)     hiatal hernia  . Cancer     skin  . Neuromuscular disorder     DM neuropathy  . Allergy    Past Surgical History  Procedure Laterality Date  . Lumbar laminectomy  08/10  . Spine surgery      lumbar laminectomy   History   Social History  . Marital Status: Married    Spouse Name: N/A    Number of Children: N/A  . Years of Education: N/A   Occupational History  . Retired from  [employer] but still works part-time    Social History Main Topics  . Smoking status: Never Smoker   . Smokeless tobacco: Never Used  . Alcohol Use: No  . Drug Use: No  . Sexually Active: Not on file   Other Topics Concern  . Not on file   Social History Narrative   Lives with husband, who is a patient of Dr Ladona Ridgel. She is active around the house and exercises regularly.   No family history on file. Current Outpatient Prescriptions on File Prior to Visit  Medication Sig Dispense Refill  . lisinopril-hydrochlorothiazide (ZESTORETIC) 20-12.5 MG per tablet Take 2 tablets by mouth daily.  60 tablet  1  . metFORMIN (GLUCOPHAGE) 1000 MG tablet Take 1,000 mg by mouth 2 (two) times daily.        . metoprolol succinate (TOPROL-XL) 100 MG 24 hr tablet Take 100 mg by mouth 2 (two) times daily. Take with or immediately following a meal.      . Multiple Vitamin (MULTIVITAMIN) tablet Take 1 tablet by mouth daily.        . Pyridoxine HCl (VITAMIN B-6 PO) Take 1 tablet by mouth 2 (two) times daily.      . rosuvastatin (CRESTOR) 20 MG tablet Take 1 tablet (20 mg total) by mouth daily.  30 tablet  5  . vitamin B-12 (CYANOCOBALAMIN) 500 MCG tablet Take 500 mcg by mouth daily.        Marland Kitchen albuterol (VENTOLIN HFA) 108 (90 BASE) MCG/ACT inhaler Inhale 2 puffs into the lungs every 6 (six) hours  as needed for wheezing.      Marland Kitchen amitriptyline (ELAVIL) 25 MG tablet Take 25 mg by mouth at bedtime.        . Calcium Carbonate-Vitamin D (CALTRATE 600+D) 600-400 MG-UNIT per tablet Take 1 tablet by mouth 2 (two) times daily.      . Cholecalciferol (VITAMIN D3) 1000 UNITS CAPS Take by mouth daily.        Marland Kitchen ibuprofen (ADVIL,MOTRIN) 200 MG tablet Take 400 mg by mouth every 6 (six) hours as needed. For pain       No current facility-administered medications on file prior to visit.   Allergies  Allergen Reactions  . Dimetapp C (Phenylephrine-Bromphen-Codeine) Itching  . Phenylephrine Hcl Other (See Comments)    unknown  .  Ultram (Tramadol Hcl) Other (See Comments)    Insomnia  . Celebrex (Celecoxib) Rash  . Cortisone Rash  . Fenofibrate Rash  . Sulfa Antibiotics Rash   Immunization History  Administered Date(s) Administered  . Influenza-Generic 08/20/2011  . Pneumococcal Polysaccharide 07/22/2012   Prior to Admission medications   Medication Sig Start Date End Date Taking? Authorizing Provider  lisinopril-hydrochlorothiazide (ZESTORETIC) 20-12.5 MG per tablet Take 2 tablets by mouth daily. 02/08/13  Yes Mary-Margaret Daphine Deutscher, FNP  metFORMIN (GLUCOPHAGE) 1000 MG tablet Take 1,000 mg by mouth 2 (two) times daily.     Yes Historical Provider, MD  metoprolol succinate (TOPROL-XL) 100 MG 24 hr tablet Take 100 mg by mouth 2 (two) times daily. Take with or immediately following a meal.   Yes Historical Provider, MD  Multiple Vitamin (MULTIVITAMIN) tablet Take 1 tablet by mouth daily.     Yes Historical Provider, MD  Pyridoxine HCl (VITAMIN B-6 PO) Take 1 tablet by mouth 2 (two) times daily.   Yes Historical Provider, MD  rosuvastatin (CRESTOR) 20 MG tablet Take 1 tablet (20 mg total) by mouth daily. 12/25/12  Yes Ileana Ladd, MD  vitamin B-12 (CYANOCOBALAMIN) 500 MCG tablet Take 500 mcg by mouth daily.     Yes Historical Provider, MD  albuterol (VENTOLIN HFA) 108 (90 BASE) MCG/ACT inhaler Inhale 2 puffs into the lungs every 6 (six) hours as needed for wheezing.    Historical Provider, MD  amitriptyline (ELAVIL) 25 MG tablet Take 25 mg by mouth at bedtime.      Historical Provider, MD  Calcium Carbonate-Vitamin D (CALTRATE 600+D) 600-400 MG-UNIT per tablet Take 1 tablet by mouth 2 (two) times daily.    Historical Provider, MD  Cholecalciferol (VITAMIN D3) 1000 UNITS CAPS Take by mouth daily.      Historical Provider, MD  ibuprofen (ADVIL,MOTRIN) 200 MG tablet Take 400 mg by mouth every 6 (six) hours as needed. For pain    Historical Provider, MD     ROS: As above in the HPI. All other systems are stable or  negative.  OBJECTIVE: APPEARANCE:  Patient in no acute distress.The patient appeared well nourished and normally developed. Acyanotic. Waist: VITAL SIGNS:BP 173/67  Pulse 70  Temp(Src) 98.3 F (36.8 C) (Oral)  Ht 5\' 4"  (1.626 m)  Wt 166 lb 9.6 oz (75.569 kg)  BMI 28.58 kg/m2  WF SKIN: warm and  Dry without overt rashes, tattoos and scars  HEAD and Neck: without JVD, Head and scalp: normal Eyes:No scleral icterus. Fundi normal, eye movements normal. Ears: Auricle normal, canal normal, Tympanic membranes normal, insufflation normal. Nose: normal Throat: normal Neck & thyroid: normal  CHEST & LUNGS: Chest wall: normal Lungs: Clear  CVS: Reveals the PMI to be  normally located. Regular rhythm, First and Second Heart sounds are normal,  absence of murmurs, rubs or gallops. Peripheral vasculature: Radial pulses: normal  Dorsal pedis pulses: abnormal, reduced Posterior pulses: abnormal, reduced Carotids: right bruit. Upstroke normal.  ABDOMEN:  Appearance: normal Benign, no organomegaly, no masses, no Abdominal Aortic enlargement. No Guarding , no rebound. No Bruits. Bowel sounds: normal  RECTAL: N/A GU: N/A  EXTREMETIES: nonedematous. Both Pedal pulses are reduced  MUSCULOSKELETAL:  Spine: normal Joints: intact  NEUROLOGIC: oriented to time,place and person; nonfocal. Strength is normal Sensory is abnormal in feet Reflexes are normal Cranial Nerves are normal.  Results for orders placed in visit on 02/23/13  COMPLETE METABOLIC PANEL WITH GFR      Result Value Range   Sodium 129 (*) 135 - 145 mEq/L   Potassium 4.2  3.5 - 5.3 mEq/L   Chloride 90 (*) 96 - 112 mEq/L   CO2 28  19 - 32 mEq/L   Glucose, Bld 132 (*) 70 - 99 mg/dL   BUN 9  6 - 23 mg/dL   Creat 8.29  5.62 - 1.30 mg/dL   Total Bilirubin 0.4  0.3 - 1.2 mg/dL   Alkaline Phosphatase 55  39 - 117 U/L   AST 36  0 - 37 U/L   ALT 34  0 - 35 U/L   Total Protein 6.6  6.0 - 8.3 g/dL   Albumin 4.4  3.5  - 5.2 g/dL   Calcium 9.5  8.4 - 86.5 mg/dL   GFR, Est African American >89     GFR, Est Non African American >89    NMR LIPOPROFILE WITH LIPIDS      Result Value Range   LDL Particle Number [id number] (*) <1000 nmol/L   LDL (calc) 70  <100 mg/dL   HDL-C 48  >=78 mg/dL   Triglycerides 469 (*) <150 mg/dL   Cholesterol, Total 629  <200 mg/dL   HDL Particle Number 52.8  >=41.3 umol/L   Large HDL-P 4.6 (*) >=4.8 umol/L   Large VLDL-P 21.6 (*) <=2.7 nmol/L   Small LDL Particle Number [id number] (*) <=527 nmol/L   LDL Size 19.6 (*) >20.5 nm   HDL Size 9.1 (*) >=9.2 nm   VLDL Size 69.4 (*) <=46.6 nm   LP-IR Score 89 (*) <=45  POCT GLYCOSYLATED HEMOGLOBIN (HGB A1C)      Result Value Range   Hemoglobin A1C 7.2      ASSESSMENT: Hypertension - uncontrolled - Plan: amLODipine (NORVASC) 5 MG tablet, metoprolol succinate (TOPROL-XL) 100 MG 24 hr tablet  Hyperlipidemia  Diabetes mellitus, type 2 - Plan: sitaGLIPtin (JANUVIA) 100 MG tablet, metFORMIN (GLUCOPHAGE) 1000 MG tablet  Stress at home  Degenerative disc disease, lumbar  Asthma, unspecified asthma severity, uncomplicated  Right carotid bruit - Plan: Carotid duplex  Hyponatremia - Plan: BASIC METABOLIC PANEL WITH GFR  Neuropathy - Plan: amitriptyline (ELAVIL) 25 MG tablet  PLAN:  Orders Placed This Encounter  Procedures  . BASIC METABOLIC PANEL WITH GFR  . Carotid duplex    Right carotid bruit. DM,HTN, HLD    Standing Status: Future     Number of Occurrences:      Standing Expiration Date: 03/02/2014    Order Specific Question:  Laterality    Answer:  Bilateral    Order Specific Question:  Where should this test be performed:    Answer:  [facility]   Meds ordered this encounter  Medications  . DISCONTD: metoprolol (LOPRESSOR) 100 MG tablet  Sig:   . amLODipine (NORVASC) 5 MG tablet    Sig: Take 1 tablet (5 mg total) by mouth daily.    Dispense:  30 tablet    Refill:  3  . sitaGLIPtin (JANUVIA) 100 MG tablet    Sig: Take  1 tablet (100 mg total) by mouth daily.    Dispense:  30 tablet    Refill:  5  . amitriptyline (ELAVIL) 25 MG tablet    Sig: Take 1 tablet (25 mg total) by mouth at bedtime.    Dispense:  30 tablet    Refill:  11  . metoprolol succinate (TOPROL-XL) 100 MG 24 hr tablet    Sig: Take 1 tablet (100 mg total) by mouth 2 (two) times daily. Take with or immediately following a meal.    Dispense:  60 tablet    Refill:  11  . metFORMIN (GLUCOPHAGE) 1000 MG tablet    Sig: Take 1 tablet (1,000 mg total) by mouth 2 (two) times daily.    Dispense:  60 tablet    Refill:  11   Reviewed labs with patient.      Dr Woodroe Mode Recommendations  Diet and Exercise discussed with patient.  For nutrition information, I recommend books:  1).Eat to Live by Dr Monico Hoar. 2).Prevent and Reverse Heart Disease by Dr Suzzette Righter. 3) Dr Katherina Right Book:  Program to Reverse Diabetes  Exercise recommendations are:  If unable to walk, then the patient can exercise in a chair 3 times a day. By flapping arms like a bird gently and raising legs outwards to the front.  If ambulatory, the patient can go for walks for 30 minutes 3 times a week. Then increase the intensity and duration as tolerated.  Goal is to try to attain exercise frequency to 5 times a week.  If applicable: Best to perform resistance exercises (machines or weights) 2 days a week and cardio type exercises 3 days per week.  Handouts on DM given in the [facility].  Discussed needed lifestyle changes and  Stress reduction.  Return in about 2 weeks (around 03/16/2013) for recheck BP.  Momen Ham P. [place] Charon, M.D.

## 2013-03-02 NOTE — Patient Instructions (Addendum)
Dr Paula Libra Recommendations  Diet and Exercise discussed with patient.  For nutrition information, I recommend books:  1).Eat to Live by Dr Excell Seltzer. 2).Prevent and Reverse Heart Disease by Dr Karl Luke. 3) Dr Janene Harvey Book:  Program to Reverse Diabetes  Exercise recommendations are:  If unable to walk, then the patient can exercise in a chair 3 times a day. By flapping arms like a bird gently and raising legs outwards to the front.  If ambulatory, the patient can go for walks for 30 minutes 3 times a week. Then increase the intensity and duration as tolerated.  Goal is to try to attain exercise frequency to 5 times a week.  If applicable: Best to perform resistance exercises (machines or weights) 2 days a week and cardio type exercises 3 days per week.   Diabetes and Foot Care Diabetes may cause you to have a poor blood supply (circulation) to your legs and feet. Because of this, the skin may be thinner, break easier, and heal more slowly. You also may have nerve damage in your legs and feet causing decreased feeling. You may not notice minor injuries to your feet that could lead to serious problems or infections. Taking care of your feet is one of the most important things you can do for yourself.  HOME CARE INSTRUCTIONS  Do not go barefoot. Bare feet are easily injured.  Check your feet daily for blisters, cuts, and redness.  Wash your feet with warm water (not hot) and mild soap. Pat your feet and between your toes until completely dry.  Apply a moisturizing lotion that does not contain alcohol or petroleum jelly to the dry skin on your feet and to dry brittle toenails. Do not put it between your toes.  Trim your toenails straight across. Do not dig under them or around the cuticle.  Do not cut corns or calluses, or try to remove them with medicine.  Wear clean cotton socks or stockings every day. Make sure they are not too tight. Do not wear  knee high stockings since they may decrease blood flow to your legs.  Wear leather shoes that fit properly and have enough cushioning. To break in new shoes, wear them just a few hours a day to avoid injuring your feet.  Wear shoes at all times, even in the house.  Do not cross your legs. This may decrease the blood flow to your feet.  If you find a minor scrape, cut, or break in the skin on your feet, keep it and the skin around it clean and dry. These areas may be cleansed with mild soap and water. Do not use peroxide, alcohol, iodine or Merthiolate.  When you remove an adhesive bandage, be sure not to harm the skin around it.  If you have a wound, look at it several times a day to make sure it is healing.  Do not use heating pads or hot water bottles. Burns can occur. If you have lost feeling in your feet or legs, you may not know it is happening until it is too late.  Report any cuts, sores or bruises to your caregiver. Do not wait! SEEK MEDICAL CARE IF:   You have an injury that is not healing or you notice redness, numbness, burning, or tingling.  Your feet always feel cold.  You have pain or cramps in your legs and feet. SEEK IMMEDIATE MEDICAL CARE IF:   There is increasing redness, swelling,  or increasing pain in the wound.  There is a red line that goes up your leg.  Pus is coming from a wound.  You develop an unexplained oral temperature above 102 F (38.9 C), or as your caregiver suggests.  You notice a bad smell coming from an ulcer or wound. MAKE SURE YOU:   Understand these instructions.  Will watch your condition.  Will get help right away if you are not doing well or get worse. Document Released: 08/02/2000 Document Revised: 10/28/2011 Document Reviewed: 02/08/2009 [facility] Patient Information 2014 [place], [place].   Diabetes and Exercise Regular exercise is important and can help:   Control blood glucose (sugar).  Decrease blood  pressure.    Control blood lipids (cholesterol, triglycerides).  Improve overall health. BENEFITS FROM EXERCISE  Improved fitness.  Improved flexibility.  Improved endurance.  Increased bone density.  Weight control.  Increased muscle strength.  Decreased body fat.  Improvement of the body's use of insulin, a hormone.  Increased insulin sensitivity.  Reduction of insulin needs.  Reduced stress and tension.  Helps you feel better. People with diabetes who add exercise to their lifestyle gain additional benefits, including:  Weight loss.  Reduced appetite.  Improvement of the body's use of blood glucose.  Decreased risk factors for heart disease:  Lowering of cholesterol and triglycerides.  Raising the level of good cholesterol (high-density lipoproteins, HDL).  Lowering blood sugar.  Decreased blood pressure. TYPE 1 DIABETES AND EXERCISE  Exercise will usually lower your blood glucose.  If blood glucose is greater than 240 mg/dl, check urine ketones. If ketones are present, do not exercise.  Location of the insulin injection sites may need to be adjusted with exercise. Avoid injecting insulin into areas of the body that will be exercised. For example, avoid injecting insulin into:  The arms when playing tennis.  The legs when jogging. For more information, discuss this with your caregiver.  Keep a record of:  Food intake.  Type and amount of exercise.  Expected peak times of insulin action.  Blood glucose levels. Do this before, during, and after exercise. Review your records with your caregiver. This will help you to develop guidelines for adjusting food intake and insulin amounts.  TYPE 2 DIABETES AND EXERCISE  Regular physical activity can help control blood glucose.  Exercise is important because it may:  Increase the body's sensitivity to insulin.  Improve blood glucose control.  Exercise reduces the risk of heart disease. It  decreases serum cholesterol and triglycerides. It also lowers blood pressure.  Those who take insulin or oral hypoglycemic agents should watch for signs of hypoglycemia. These signs include dizziness, shaking, sweating, chills, and confusion.  Body water is lost during exercise. It must be replaced. This will help to avoid loss of body fluids (dehydration) or heat stroke. Be sure to talk to your caregiver before starting an exercise program to make sure it is safe for you. Remember, any activity is better than none.  Document Released: 10/26/2003 Document Revised: 10/28/2011 Document Reviewed: 02/09/2009 [facility] Patient Information 2014 [place], [place].   Type 2 Diabetes Mellitus, Adult Type 2 diabetes mellitus, often simply referred to as type 2 diabetes, is a long-lasting (chronic) disease. In type 2 diabetes, the pancreas does not make enough insulin (a hormone), the cells are less responsive to the insulin that is made (insulin resistance), or both. Normally, insulin moves sugars from food into the tissue cells. The tissue cells use the sugars for energy. The lack of insulin  or the lack of normal response to insulin causes excess sugars to build up in the blood instead of going into the tissue cells. As a result, high blood sugar (hyperglycemia) develops. The effect of high sugar (glucose) levels can cause many complications. Type 2 diabetes was also previously called adult-onset diabetes but it can occur at any age.  RISK FACTORS  A person is predisposed to developing type 2 diabetes if someone in the family has the disease and also has one or more of the following primary risk factors:  Overweight.  An inactive lifestyle.  A history of consistently eating high-calorie foods. Maintaining a normal weight and regular physical activity can reduce the chance of developing type 2 diabetes. SYMPTOMS  A person with type 2 diabetes may not show symptoms initially. The symptoms of type 2  diabetes appear slowly. The symptoms include:  Increased thirst (polydipsia).  Increased urination (polyuria).  Increased urination during the night (nocturia).  Weight loss. This weight loss may be rapid.  Frequent, recurring infections.  Tiredness (fatigue).  Weakness.  Vision changes, such as blurred vision.  Fruity smell to your breath.  Abdominal pain.  Nausea or vomiting.  Cuts or bruises which are slow to heal.  Tingling or numbness in the hands or feet. DIAGNOSIS Type 2 diabetes is frequently not diagnosed until complications of diabetes are present. Type 2 diabetes is diagnosed when symptoms or complications are present and when blood glucose levels are increased. Your blood glucose level may be checked by one or more of the following blood tests:  A fasting blood glucose test. You will not be allowed to eat for at least 8 hours before a blood sample is taken.  A random blood glucose test. Your blood glucose is checked at any time of the day regardless of when you ate.  A hemoglobin A1c blood glucose test. A hemoglobin A1c test provides information about blood glucose control over the previous 3 months.  An oral glucose tolerance test (OGTT). Your blood glucose is measured after you have not eaten (fasted) for 2 hours and then after you drink a glucose-containing beverage. TREATMENT   You may need to take insulin or diabetes medicine daily to keep blood glucose levels in the desired range.  You will need to match insulin dosing with exercise and healthy food choices. The treatment goal is to maintain the before meal blood sugar (preprandial glucose) level at 70 130 mg/dL. HOME CARE INSTRUCTIONS   Have your hemoglobin A1c level checked twice a year.  Perform daily blood glucose monitoring as directed by your caregiver.  Monitor urine ketones when you are ill and as directed by your caregiver.  Take your diabetes medicine or insulin as directed by your  caregiver to maintain your blood glucose levels in the desired range.  Never run out of diabetes medicine or insulin. It is needed every day.  Adjust insulin based on your intake of carbohydrates. Carbohydrates can raise blood glucose levels but need to be included in your diet. Carbohydrates provide vitamins, minerals, and fiber which are an essential part of a healthy diet. Carbohydrates are found in fruits, vegetables, whole grains, dairy products, legumes, and foods containing added sugars.    Eat healthy foods. Alternate 3 meals with 3 snacks.  Lose weight if overweight.  Carry a medical alert card or wear your medical alert jewelry.  Carry a 15 gram carbohydrate snack with you at all times to treat low blood glucose (hypoglycemia). Some examples of 15 gram  carbohydrate snacks include:  Glucose tablets, 3 or 4   Glucose gel, 15 gram tube  Raisins, 2 tablespoons (24 grams)  Jelly beans, 6  Animal crackers, 8  Regular pop, 4 ounces (120 mL)  Gummy treats, 9  Recognize hypoglycemia. Hypoglycemia occurs with blood glucose levels of 70 mg/dL and below. The risk for hypoglycemia increases when fasting or skipping meals, during or after intense exercise, and during sleep. Hypoglycemia symptoms can include:  Tremors or shakes.  Decreased ability to concentrate.  Sweating.  Increased heart rate.  Headache.  Dry mouth.  Hunger.  Irritability.  Anxiety.  Restless sleep.  Altered speech or coordination.  Confusion.  Treat hypoglycemia promptly. If you are alert and able to safely swallow, follow the 15:15 rule:  Take 15 20 grams of rapid-acting glucose or carbohydrate. Rapid-acting options include glucose gel, glucose tablets, or 4 ounces (120 mL) of fruit juice, regular soda, or low fat milk.  Check your blood glucose level 15 minutes after taking the glucose.  Take 15 20 grams more of glucose if the repeat blood glucose level is still 70 mg/dL or  below.  Eat a meal or snack within 1 hour once blood glucose levels return to normal.    Be alert to polyuria and polydipsia which are early signs of hyperglycemia. An early awareness of hyperglycemia allows for prompt treatment. Treat hyperglycemia as directed by your caregiver.  Engage in at least 150 minutes of moderate-intensity physical activity a week, spread over at least 3 days of the week or as directed by your caregiver. In addition, you should engage in resistance exercise at least 2 times a week or as directed by your caregiver.  Adjust your medicine and food intake as needed if you start a new exercise or sport.  Follow your sick day plan at any time you are unable to eat or drink as usual.  Avoid tobacco use.  Limit alcohol intake to no more than 1 drink per day for nonpregnant women and 2 drinks per day for men. You should drink alcohol only when you are also eating food. Talk with your caregiver whether alcohol is safe for you. Tell your caregiver if you drink alcohol several times a week.  Follow up with your caregiver regularly.  Schedule an eye exam soon after the diagnosis of type 2 diabetes and then annually.  Perform daily skin and foot care. Examine your skin and feet daily for cuts, bruises, redness, nail problems, bleeding, blisters, or sores. A foot exam by a caregiver should be done annually.  Brush your teeth and gums at least twice a day and floss at least once a day. Follow up with your dentist regularly.  Share your diabetes management plan with your workplace or school.  Stay up-to-date with immunizations.  Learn to manage stress.  Obtain ongoing diabetes education and support as needed.  Participate in, or seek rehabilitation as needed to maintain or improve independence and quality of life. Request a physical or occupational therapy referral if you are having foot or hand numbness or difficulties with grooming, dressing, eating, or physical  activity. SEEK MEDICAL CARE IF:   You are unable to eat food or drink fluids for more than 6 hours.  You have nausea and vomiting for more than 6 hours.  Your blood glucose level is over 240 mg/dL.  There is a change in mental status.  You develop an additional serious illness.  You have diarrhea for more than 6 hours.  You  have been sick or have had a fever for a couple of days and are not getting better.  You have pain during any physical activity.  SEEK IMMEDIATE MEDICAL CARE IF:  You have difficulty breathing.  You have moderate to large ketone levels. MAKE SURE YOU:  Understand these instructions.  Will watch your condition.  Will get help right away if you are not doing well or get worse. Document Released: 08/05/2005 Document Revised: 04/29/2012 Document Reviewed: 03/03/2012 [facility] Patient Information 2014 [place], [place].

## 2013-03-03 ENCOUNTER — Other Ambulatory Visit: Payer: Self-pay | Admitting: Family Medicine

## 2013-03-03 DIAGNOSIS — I10 Essential (primary) hypertension: Secondary | ICD-10-CM

## 2013-03-03 MED ORDER — LISINOPRIL 30 MG PO TABS: 30.0000 mg | ORAL_TABLET | Freq: Every day | ORAL | Status: DC

## 2013-03-03 NOTE — Progress Notes (Signed)
Quick Note:  Labs abnormal.sodium very low. liberalise salt. Limit water to 1.2 liters per 24 hours. Stop the lisinopril with the HCTZ. Will switch to plain lisinopril. The HCTZ can cause the sodium to be low. ______

## 2013-03-05 ENCOUNTER — Ambulatory Visit (HOSPITAL_COMMUNITY)
Admission: RE | Admit: 2013-03-05 | Discharge: 2013-03-05 | Disposition: A | Discharge status: Home / Self Care | Payer: Medicare Other | Source: Ambulatory Visit | Attending: Family Medicine | Admitting: Family Medicine

## 2013-03-05 ENCOUNTER — Other Ambulatory Visit: Payer: Self-pay | Admitting: Family Medicine

## 2013-03-05 DIAGNOSIS — R0989 Other specified symptoms and signs involving the circulatory and respiratory systems: Secondary | ICD-10-CM

## 2013-03-05 DIAGNOSIS — E119 Type 2 diabetes mellitus without complications: Secondary | ICD-10-CM | POA: Insufficient documentation

## 2013-03-05 DIAGNOSIS — I10 Essential (primary) hypertension: Secondary | ICD-10-CM | POA: Insufficient documentation

## 2013-03-09 ENCOUNTER — Other Ambulatory Visit: Payer: Self-pay | Admitting: Family Medicine

## 2013-03-09 DIAGNOSIS — R0989 Other specified symptoms and signs involving the circulatory and respiratory systems: Secondary | ICD-10-CM

## 2013-03-12 ENCOUNTER — Other Ambulatory Visit (HOSPITAL_COMMUNITY): Payer: Medicare Other

## 2013-03-30 ENCOUNTER — Ambulatory Visit (INDEPENDENT_AMBULATORY_CARE_PROVIDER_SITE_OTHER): Payer: Medicare Other | Admitting: Family Medicine

## 2013-03-30 VITALS — BP 146/62 | HR 82 | Temp 98.6°F | Ht 64.0 in | Wt 165.0 lb

## 2013-03-30 DIAGNOSIS — M792 Neuralgia and neuritis, unspecified: Secondary | ICD-10-CM

## 2013-03-30 DIAGNOSIS — G5790 Unspecified mononeuropathy of unspecified lower limb: Secondary | ICD-10-CM

## 2013-03-30 DIAGNOSIS — J4520 Mild intermittent asthma, uncomplicated: Secondary | ICD-10-CM

## 2013-03-30 DIAGNOSIS — I10 Essential (primary) hypertension: Secondary | ICD-10-CM

## 2013-03-30 DIAGNOSIS — E119 Type 2 diabetes mellitus without complications: Secondary | ICD-10-CM

## 2013-03-30 DIAGNOSIS — M5137 Other intervertebral disc degeneration, lumbosacral region: Secondary | ICD-10-CM

## 2013-03-30 DIAGNOSIS — R0989 Other specified symptoms and signs involving the circulatory and respiratory systems: Secondary | ICD-10-CM

## 2013-03-30 DIAGNOSIS — F439 Reaction to severe stress, unspecified: Secondary | ICD-10-CM

## 2013-03-30 DIAGNOSIS — Z639 Problem related to primary support group, unspecified: Secondary | ICD-10-CM

## 2013-03-30 DIAGNOSIS — J45909 Unspecified asthma, uncomplicated: Secondary | ICD-10-CM

## 2013-03-30 DIAGNOSIS — E785 Hyperlipidemia, unspecified: Secondary | ICD-10-CM

## 2013-03-30 DIAGNOSIS — E871 Hypo-osmolality and hyponatremia: Secondary | ICD-10-CM

## 2013-03-30 DIAGNOSIS — M51369 Other intervertebral disc degeneration, lumbar region without mention of lumbar back pain or lower extremity pain: Secondary | ICD-10-CM

## 2013-03-30 DIAGNOSIS — M5136 Other intervertebral disc degeneration, lumbar region: Secondary | ICD-10-CM

## 2013-03-30 NOTE — Progress Notes (Signed)
Patient ID: Kristi Willis, female   DOB: 1941/06/14, 72 y.o.   MRN: [id number] SUBJECTIVE: CC: Chief Complaint  Patient presents with  . PT WAKING UP SEEING THINGS    THINKS MAY HAVE STARTED WITH TAKING LISINOPRIL  . NIGHTMARES    HPI: Sx as above Patient is here for follow up of hypertension: denies Headache;deniesChest Pain;denies weakness;denies Shortness of Breath or Orthopnea;denies Visual changes;denies palpitations;denies cough;denies pedal edema;denies symptoms of TIA or stroke; admits to Compliance with medications.stopoped the crestor because she did not what was causing the Sx Problems with medications.as above  Also had hyponatremia  And meds were adjusted.  Past Medical History  Diagnosis Date  . SVT (supraventricular tachycardia)   . Hypertension   . Diabetes mellitus   . Hypercholesteremia   . Neuropathy   . Asthma   . GERD (gastroesophageal reflux disease)     hiatal hernia  . Cancer     skin  . Neuromuscular disorder     DM neuropathy  . Allergy    Past Surgical History  Procedure Laterality Date  . Lumbar laminectomy  08/10  . Spine surgery      lumbar laminectomy   History   Social History  . Marital Status: Married    Spouse Name: N/A    Number of Children: N/A  . Years of Education: N/A   Occupational History  . Retired from [employer] but still works part-time    Social History Main Topics  . Smoking status: Never Smoker   . Smokeless tobacco: Never Used  . Alcohol Use: No  . Drug Use: No  . Sexually Active: Not on file   Other Topics Concern  . Not on file   Social History Narrative   Lives with husband, who is a patient of Dr Ladona Ridgel. She is active around the house and exercises regularly.   Family History  Problem Relation Age of Onset  . Cancer Mother   . Depression Mother   . Heart disease Father   . Stroke Father    Current Outpatient Prescriptions on File Prior to Visit  Medication Sig Dispense Refill  . albuterol  (VENTOLIN HFA) 108 (90 BASE) MCG/ACT inhaler Inhale 2 puffs into the lungs every 6 (six) hours as needed for wheezing.      Marland Kitchen amitriptyline (ELAVIL) 25 MG tablet Take 1 tablet (25 mg total) by mouth at bedtime.  30 tablet  11  . amLODipine (NORVASC) 5 MG tablet Take 1 tablet (5 mg total) by mouth daily.  30 tablet  3  . Calcium Carbonate-Vitamin D (CALTRATE 600+D) 600-400 MG-UNIT per tablet Take 1 tablet by mouth 2 (two) times daily.      . Cholecalciferol (VITAMIN D3) 1000 UNITS CAPS Take by mouth daily.        Marland Kitchen ibuprofen (ADVIL,MOTRIN) 200 MG tablet Take 400 mg by mouth every 6 (six) hours as needed. For pain      . lisinopril (PRINIVIL,ZESTRIL) 30 MG tablet Take 1 tablet (30 mg total) by mouth daily.  30 tablet  5  . metFORMIN (GLUCOPHAGE) 1000 MG tablet Take 1 tablet (1,000 mg total) by mouth 2 (two) times daily.  60 tablet  11  . metoprolol succinate (TOPROL-XL) 100 MG 24 hr tablet Take 1 tablet (100 mg total) by mouth 2 (two) times daily. Take with or immediately following a meal.  60 tablet  11  . Multiple Vitamin (MULTIVITAMIN) tablet Take 1 tablet by mouth daily.        Marland Kitchen  Pyridoxine HCl (VITAMIN B-6 PO) Take 1 tablet by mouth 2 (two) times daily.      . sitaGLIPtin (JANUVIA) 100 MG tablet Take 1 tablet (100 mg total) by mouth daily.  30 tablet  5  . vitamin B-12 (CYANOCOBALAMIN) 500 MCG tablet Take 500 mcg by mouth daily.        . rosuvastatin (CRESTOR) 20 MG tablet Take 1 tablet (20 mg total) by mouth daily.  30 tablet  5   No current facility-administered medications on file prior to visit.   Allergies  Allergen Reactions  . Dimetapp C (Phenylephrine-Bromphen-Codeine) Itching  . Phenylephrine Hcl Other (See Comments)    unknown  . Ultram (Tramadol Hcl) Other (See Comments)    Insomnia  . Celebrex (Celecoxib) Rash  . Cortisone Rash  . Fenofibrate Rash  . Sulfa Antibiotics Rash   Immunization History  Administered Date(s) Administered  . Influenza-Generic 08/20/2011  .  Pneumococcal Polysaccharide 07/22/2012   Prior to Admission medications   Medication Sig Start Date End Date Taking? Authorizing Provider  albuterol (VENTOLIN HFA) 108 (90 BASE) MCG/ACT inhaler Inhale 2 puffs into the lungs every 6 (six) hours as needed for wheezing.   Yes Historical Provider, MD  amitriptyline (ELAVIL) 25 MG tablet Take 1 tablet (25 mg total) by mouth at bedtime. 03/02/13  Yes Ileana Ladd, MD  amLODipine (NORVASC) 5 MG tablet Take 1 tablet (5 mg total) by mouth daily. 03/02/13  Yes Ileana Ladd, MD  Calcium Carbonate-Vitamin D (CALTRATE 600+D) 600-400 MG-UNIT per tablet Take 1 tablet by mouth 2 (two) times daily.   Yes Historical Provider, MD  Cholecalciferol (VITAMIN D3) 1000 UNITS CAPS Take by mouth daily.     Yes Historical Provider, MD  ibuprofen (ADVIL,MOTRIN) 200 MG tablet Take 400 mg by mouth every 6 (six) hours as needed. For pain   Yes Historical Provider, MD  lisinopril (PRINIVIL,ZESTRIL) 30 MG tablet Take 1 tablet (30 mg total) by mouth daily. 03/03/13  Yes Ileana Ladd, MD  metFORMIN (GLUCOPHAGE) 1000 MG tablet Take 1 tablet (1,000 mg total) by mouth 2 (two) times daily. 03/02/13  Yes Ileana Ladd, MD  metoprolol succinate (TOPROL-XL) 100 MG 24 hr tablet Take 1 tablet (100 mg total) by mouth 2 (two) times daily. Take with or immediately following a meal. 03/02/13  Yes Ileana Ladd, MD  Multiple Vitamin (MULTIVITAMIN) tablet Take 1 tablet by mouth daily.     Yes Historical Provider, MD  Pyridoxine HCl (VITAMIN B-6 PO) Take 1 tablet by mouth 2 (two) times daily.   Yes Historical Provider, MD  sitaGLIPtin (JANUVIA) 100 MG tablet Take 1 tablet (100 mg total) by mouth daily. 03/02/13  Yes Ileana Ladd, MD  vitamin B-12 (CYANOCOBALAMIN) 500 MCG tablet Take 500 mcg by mouth daily.     Yes Historical Provider, MD  rosuvastatin (CRESTOR) 20 MG tablet Take 1 tablet (20 mg total) by mouth daily. 12/25/12   Ileana Ladd, MD      ROS: As above in the HPI. All other  systems are stable or negative.  OBJECTIVE: APPEARANCE:  Patient in no acute distress.The patient appeared well nourished and normally developed. Acyanotic. Waist: VITAL SIGNS:BP 146/62  Pulse 82  Temp(Src) 98.6 F (37 C) (Oral)  Ht 5\' 4"  (1.626 m)  Wt 165 lb (74.844 kg)  BMI 28.31 kg/m2 WF Recheck BP 145/70 SKIN: warm and  Dry without overt rashes, tattoos and scars  HEAD and Neck: without JVD, Head and scalp: normal  Eyes:No scleral icterus. Fundi normal, eye movements normal. Ears: Auricle normal, canal normal, Tympanic membranes normal, insufflation normal. Nose: normal Throat: normal Neck & thyroid: normal  CHEST & LUNGS: Chest wall: normal Lungs: Clear  CVS: Reveals the PMI to be normally located. Regular rhythm, First and Second Heart sounds are normal,  absence of murmurs, rubs or gallops. Peripheral vasculature: Radial pulses: normal Dorsal pedis pulses: normal Posterior pulses: normal  ABDOMEN:  Appearance: normal Benign, no organomegaly, no masses, no Abdominal Aortic enlargement. No Guarding , no rebound. No Bruits. Bowel sounds: normal  RECTAL: N/A GU: N/A  EXTREMETIES: nonedematous. Both Femoral and Pedal pulses are normal.  MUSCULOSKELETAL:  Spine: normal Joints: intact  NEUROLOGIC: oriented to time,place and person; nonfocal.  Results for orders placed in visit on 03/02/13  BASIC METABOLIC PANEL WITH GFR      Result Value Range   Sodium 128 (*) 135 - 145 mEq/L   Potassium 4.5  3.5 - 5.3 mEq/L   Chloride 91 (*) 96 - 112 mEq/L   CO2 25  19 - 32 mEq/L   Glucose, Bld 134 (*) 70 - 99 mg/dL   BUN 8  6 - 23 mg/dL   Creat 1.61  0.96 - 0.45 mg/dL   Calcium 9.4  8.4 - 40.9 mg/dL   GFR, Est African American >89     GFR, Est Non African American >89      ASSESSMENT: Diabetes mellitus, type 2  Foot neuralgia, unspecified laterality  Hyperlipidemia  Right carotid bruit  Hypertension - Plan: BMP8+EGFR  Asthma, mild intermittent,  uncomplicated  Stress at home  Degenerative disc disease, lumbar  Hyponatremia - Plan: BMP8+EGFR  PLAN: Orders Placed This Encounter  Procedures  . BMP8+EGFR    Discussed her medications and side effects. Suspect that the hallucination is secondary to amitriptyline after the additional medications were added.  Medications Discontinued During This Encounter  Medication Reason  . amitriptyline (ELAVIL) 25 MG tablet Side effect (s)   Return in about 2 weeks (around 04/13/2013) for Recheck medical problems, recheck BP.  Dayane Hillenburg P. [place]esto Charon, M.D.

## 2013-03-31 LAB — BMP8+EGFR
BUN/Creatinine Ratio: 12 (ref 11–26)
BUN: 7 mg/dL — ABNORMAL LOW (ref 8–27)
CO2: 24 mmol/L (ref 18–29)
Calcium: 9.5 mg/dL (ref 8.6–10.2)
Chloride: 100 mmol/L (ref 97–108)
Creatinine, Ser: 0.59 mg/dL (ref 0.57–1.00)
GFR calc Af Amer: 107 mL/min/{1.73_m2} (ref >59)
GFR calc non Af Amer: 93 mL/min/{1.73_m2} (ref >59)
Glucose: 129 mg/dL — ABNORMAL HIGH (ref 65–99)
Potassium: 4.2 mmol/L (ref 3.5–5.2)
Sodium: 139 mmol/L (ref 134–144)

## 2013-04-13 ENCOUNTER — Ambulatory Visit (INDEPENDENT_AMBULATORY_CARE_PROVIDER_SITE_OTHER): Payer: Medicare Other | Admitting: Family Medicine

## 2013-04-13 ENCOUNTER — Encounter: Payer: Self-pay | Admitting: Family Medicine

## 2013-04-13 VITALS — BP 148/64 | HR 67 | Temp 98.7°F | Wt 163.2 lb

## 2013-04-13 DIAGNOSIS — F4320 Adjustment disorder, unspecified: Secondary | ICD-10-CM | POA: Insufficient documentation

## 2013-04-13 DIAGNOSIS — I10 Essential (primary) hypertension: Secondary | ICD-10-CM

## 2013-04-13 DIAGNOSIS — I152 Hypertension secondary to endocrine disorders: Secondary | ICD-10-CM | POA: Insufficient documentation

## 2013-04-13 DIAGNOSIS — R600 Localized edema: Secondary | ICD-10-CM | POA: Insufficient documentation

## 2013-04-13 DIAGNOSIS — E1159 Type 2 diabetes mellitus with other circulatory complications: Secondary | ICD-10-CM | POA: Insufficient documentation

## 2013-04-13 DIAGNOSIS — R609 Edema, unspecified: Secondary | ICD-10-CM

## 2013-04-13 MED ORDER — LISINOPRIL 40 MG PO TABS: 40.0000 mg | ORAL_TABLET | Freq: Every day | ORAL | Status: DC

## 2013-04-13 MED ORDER — CITALOPRAM HYDROBROMIDE 20 MG PO TABS: 20.0000 mg | ORAL_TABLET | Freq: Every day | ORAL | Status: DC

## 2013-04-13 NOTE — Progress Notes (Signed)
Patient ID: Kristi Willis, female   DOB: 14-Jul-1941, 72 y.o.   MRN: [id number] SUBJECTIVE: CC: Chief Complaint  Patient presents with  . Follow-up    reck bp  pt did not brng in her meds as requested. c/o after taking amlodipine her feet ankles swell   . Depression    wants something for depression and neuropathy    HPI: As above. Feeling down and would like something for depression especially since she had to stop the amitriptyline and the nightmares went away.has a lot of family stresses.  Brought her BPmedications.  Patient is here for follow up of hypertension: denies Headache;deniesChest Pain;denies weakness;denies Shortness of Breath or Orthopnea;denies Visual changes;denies palpitations;denies cough pedal edema after starting the amlodipine. This is irritating. ;denies symptoms of TIA or stroke; admits to Compliance with medications. denies Problems with medications.  Past Medical History  Diagnosis Date  . SVT (supraventricular tachycardia)   . Hypertension   . Diabetes mellitus   . Hypercholesteremia   . Neuropathy   . Asthma   . GERD (gastroesophageal reflux disease)     hiatal hernia  . Cancer     skin  . Neuromuscular disorder     DM neuropathy  . Allergy    Past Surgical History  Procedure Laterality Date  . Lumbar laminectomy  08/10  . Spine surgery      lumbar laminectomy   History   Social History  . Marital Status: Married    Spouse Name: N/A    Number of Children: N/A  . Years of Education: N/A   Occupational History  . Retired from [employer] but still works part-time    Social History Main Topics  . Smoking status: Never Smoker   . Smokeless tobacco: Never Used  . Alcohol Use: No  . Drug Use: No  . Sexual Activity: Not on file   Other Topics Concern  . Not on file   Social History Narrative   Lives with husband, who is a patient of Dr Ladona Ridgel. She is active around the house and exercises regularly.   Family History  Problem  Relation Age of Onset  . Cancer Mother   . Depression Mother   . Heart disease Father   . Stroke Father    Current Outpatient Prescriptions on File Prior to Visit  Medication Sig Dispense Refill  . albuterol (VENTOLIN HFA) 108 (90 BASE) MCG/ACT inhaler Inhale 2 puffs into the lungs every 6 (six) hours as needed for wheezing.      . Calcium Carbonate-Vitamin D (CALTRATE 600+D) 600-400 MG-UNIT per tablet Take 1 tablet by mouth 2 (two) times daily.      . Cholecalciferol (VITAMIN D3) 1000 UNITS CAPS Take by mouth daily.        Marland Kitchen ibuprofen (ADVIL,MOTRIN) 200 MG tablet Take 400 mg by mouth every 6 (six) hours as needed. For pain      . metFORMIN (GLUCOPHAGE) 1000 MG tablet Take 1 tablet (1,000 mg total) by mouth 2 (two) times daily.  60 tablet  11  . Multiple Vitamin (MULTIVITAMIN) tablet Take 1 tablet by mouth daily.        . Pyridoxine HCl (VITAMIN B-6 PO) Take 1 tablet by mouth 2 (two) times daily.      . rosuvastatin (CRESTOR) 20 MG tablet Take 1 tablet (20 mg total) by mouth daily.  30 tablet  5  . sitaGLIPtin (JANUVIA) 100 MG tablet Take 1 tablet (100 mg total) by mouth daily.  30 tablet  5  . vitamin B-12 (CYANOCOBALAMIN) 500 MCG tablet Take 500 mcg by mouth daily.        . metoprolol succinate (TOPROL-XL) 100 MG 24 hr tablet Take 1 tablet (100 mg total) by mouth 2 (two) times daily. Take with or immediately following a meal.  60 tablet  11   No current facility-administered medications on file prior to visit.   Allergies  Allergen Reactions  . Dimetapp C [Phenylephrine-Bromphen-Codeine] Itching  . Phenylephrine Hcl Other (See Comments)    unknown  . Ultram [Tramadol Hcl] Other (See Comments)    Insomnia  . Celebrex [Celecoxib] Rash  . Cortisone Rash  . Fenofibrate Rash  . Sulfa Antibiotics Rash   Immunization History  Administered Date(s) Administered  . Influenza-Generic 08/20/2011  . Pneumococcal Polysaccharide 07/22/2012   Prior to Admission medications   Medication Sig  Start Date End Date Taking? Authorizing Provider  albuterol (VENTOLIN HFA) 108 (90 BASE) MCG/ACT inhaler Inhale 2 puffs into the lungs every 6 (six) hours as needed for wheezing.   Yes Historical Provider, MD  amLODipine (NORVASC) 5 MG tablet Take 0.5 tablets (2.5 mg total) by mouth daily. 04/13/13  Yes Ileana Ladd, MD  Calcium Carbonate-Vitamin D (CALTRATE 600+D) 600-400 MG-UNIT per tablet Take 1 tablet by mouth 2 (two) times daily.   Yes Historical Provider, MD  Cholecalciferol (VITAMIN D3) 1000 UNITS CAPS Take by mouth daily.     Yes Historical Provider, MD  ibuprofen (ADVIL,MOTRIN) 200 MG tablet Take 400 mg by mouth every 6 (six) hours as needed. For pain   Yes Historical Provider, MD  metFORMIN (GLUCOPHAGE) 1000 MG tablet Take 1 tablet (1,000 mg total) by mouth 2 (two) times daily. 03/02/13  Yes Ileana Ladd, MD  Multiple Vitamin (MULTIVITAMIN) tablet Take 1 tablet by mouth daily.     Yes Historical Provider, MD  Pyridoxine HCl (VITAMIN B-6 PO) Take 1 tablet by mouth 2 (two) times daily.   Yes Historical Provider, MD  rosuvastatin (CRESTOR) 20 MG tablet Take 1 tablet (20 mg total) by mouth daily. 12/25/12  Yes Ileana Ladd, MD  sitaGLIPtin (JANUVIA) 100 MG tablet Take 1 tablet (100 mg total) by mouth daily. 03/02/13  Yes Ileana Ladd, MD  vitamin B-12 (CYANOCOBALAMIN) 500 MCG tablet Take 500 mcg by mouth daily.     Yes Historical Provider, MD  citalopram (CELEXA) 20 MG tablet Take 1 tablet (20 mg total) by mouth daily. 04/13/13   Ileana Ladd, MD  lisinopril (PRINIVIL,ZESTRIL) 40 MG tablet Take 1 tablet (40 mg total) by mouth daily. 04/13/13   Ileana Ladd, MD  metoprolol succinate (TOPROL-XL) 100 MG 24 hr tablet Take 1 tablet (100 mg total) by mouth 2 (two) times daily. Take with or immediately following a meal. 03/02/13   Ileana Ladd, MD     ROS: As above in the HPI. All other systems are stable or negative.  OBJECTIVE: APPEARANCE:  Patient in no acute distress.The patient  appeared well nourished and normally developed. Acyanotic. Waist: VITAL SIGNS:BP 148/64  Pulse 67  Temp(Src) 98.7 F (37.1 C) (Oral)  Wt 163 lb 3.2 oz (74.027 kg)  BMI 28 kg/m2 WF  SKIN: warm and  Dry without overt rashes, tattoos and scars  HEAD and Neck: without JVD, Head and scalp: normal Eyes:No scleral icterus. Fundi normal, eye movements normal. Ears: Auricle normal, canal normal, Tympanic membranes normal, insufflation normal. Nose: normal Throat: normal Neck & thyroid: normal  CHEST & LUNGS: Chest wall: normal  Lungs: Clear  CVS: Reveals the PMI to be normally located. Regular rhythm, First and Second Heart sounds are normal,  absence of murmurs, rubs or gallops. Peripheral vasculature: Radial pulses: normal Dorsal pedis pulses: normal Posterior pulses: normal  ABDOMEN:  Appearance: normal Benign, no organomegaly, no masses, no Abdominal Aortic enlargement. No Guarding , no rebound. No Bruits. Bowel sounds: normal  RECTAL: N/A GU: N/A  EXTREMETIES: trace edema  MUSCULOSKELETAL:  Spine: normal Joints: intact  NEUROLOGIC: oriented to time,place and person; nonfocal. Strength is normal Sensory is normal Reflexes are normal Cranial Nerves are normal.  Psychiatric. Not suicidal not delusional, not hallucinating.  Results for orders placed in visit on 03/30/13  [facility]      Result Value Range   Glucose 129 (*) 65 - 99 mg/dL   BUN 7 (*) 8 - 27 mg/dL   Creatinine, Ser 1.61  0.57 - 1.00 mg/dL   GFR calc non Af Amer 93  >59 mL/min/1.73   GFR calc Af Amer 107  >59 mL/min/1.73   BUN/Creatinine Ratio 12  11 - 26   Sodium 139  134 - 144 mmol/L   Potassium 4.2  3.5 - 5.2 mmol/L   Chloride 100  97 - 108 mmol/L   CO2 24  18 - 29 mmol/L   Calcium 9.5  8.6 - 10.2 mg/dL    ASSESSMENT: Hypertension - uncontrolled - Plan: amLODipine (NORVASC) 5 MG tablet  HTN (hypertension) - Plan: lisinopril (PRINIVIL,ZESTRIL) 40 MG tablet  Adjustment reaction - Plan:  citalopram (CELEXA) 20 MG tablet  Pedal edema  PLAN: Meds ordered this encounter  Medications  . DISCONTD: amitriptyline (ELAVIL) 25 MG tablet    Sig:   . DISCONTD: lisinopril-hydrochlorothiazide (PRINZIDE,ZESTORETIC) 20-12.5 MG per tablet    Sig:   . amLODipine (NORVASC) 5 MG tablet    Sig: Take 0.5 tablets (2.5 mg total) by mouth daily.    Dispense:  30 tablet    Refill:  3  . lisinopril (PRINIVIL,ZESTRIL) 40 MG tablet    Sig: Take 1 tablet (40 mg total) by mouth daily.    Dispense:  30 tablet    Refill:  5  . citalopram (CELEXA) 20 MG tablet    Sig: Take 1 tablet (20 mg total) by mouth daily.    Dispense:  30 tablet    Refill:  5    Return in about 4 weeks (around 05/11/2013) for Recheck medical problems. Also to recheck BP  Jaqua Ching P. [place] Charon, M.D.

## 2013-05-20 ENCOUNTER — Encounter: Payer: Self-pay | Admitting: Family Medicine

## 2013-05-20 ENCOUNTER — Ambulatory Visit (INDEPENDENT_AMBULATORY_CARE_PROVIDER_SITE_OTHER): Payer: Medicare Other | Admitting: Family Medicine

## 2013-05-20 VITALS — BP 200/73 | HR 65 | Temp 98.0°F | Wt 161.8 lb

## 2013-05-20 DIAGNOSIS — R0989 Other specified symptoms and signs involving the circulatory and respiratory systems: Secondary | ICD-10-CM

## 2013-05-20 DIAGNOSIS — M51369 Other intervertebral disc degeneration, lumbar region without mention of lumbar back pain or lower extremity pain: Secondary | ICD-10-CM

## 2013-05-20 DIAGNOSIS — E785 Hyperlipidemia, unspecified: Secondary | ICD-10-CM

## 2013-05-20 DIAGNOSIS — I10 Essential (primary) hypertension: Secondary | ICD-10-CM | POA: Insufficient documentation

## 2013-05-20 DIAGNOSIS — M5137 Other intervertebral disc degeneration, lumbosacral region: Secondary | ICD-10-CM

## 2013-05-20 DIAGNOSIS — Z23 Encounter for immunization: Secondary | ICD-10-CM

## 2013-05-20 DIAGNOSIS — R609 Edema, unspecified: Secondary | ICD-10-CM

## 2013-05-20 DIAGNOSIS — E119 Type 2 diabetes mellitus without complications: Secondary | ICD-10-CM

## 2013-05-20 DIAGNOSIS — F439 Reaction to severe stress, unspecified: Secondary | ICD-10-CM

## 2013-05-20 DIAGNOSIS — G5790 Unspecified mononeuropathy of unspecified lower limb: Secondary | ICD-10-CM

## 2013-05-20 DIAGNOSIS — M5136 Other intervertebral disc degeneration, lumbar region: Secondary | ICD-10-CM

## 2013-05-20 DIAGNOSIS — R600 Localized edema: Secondary | ICD-10-CM

## 2013-05-20 DIAGNOSIS — F4320 Adjustment disorder, unspecified: Secondary | ICD-10-CM

## 2013-05-20 DIAGNOSIS — Z639 Problem related to primary support group, unspecified: Secondary | ICD-10-CM

## 2013-05-20 DIAGNOSIS — M792 Neuralgia and neuritis, unspecified: Secondary | ICD-10-CM

## 2013-05-20 DIAGNOSIS — J45909 Unspecified asthma, uncomplicated: Secondary | ICD-10-CM

## 2013-05-20 NOTE — Patient Instructions (Signed)
Stress Management Stress is a state of physical or mental tension that often results from changes in your life or normal routine. Some common causes of stress are:  Death of a loved one.  Injuries or severe illnesses.  Getting fired or changing jobs.  Moving into a new home. Other causes may be:  Sexual problems.  Business or financial losses.  Taking on a large debt.  Regular conflict with someone at home or at work.  Constant tiredness from lack of sleep. It is not just bad things that are stressful. It may be stressful to:  Win the lottery.  Get married.  Buy a new car. The amount of stress that can be easily tolerated varies from person to person. Changes generally cause stress, regardless of the types of change. Too much stress can affect your health. It may lead to physical or emotional problems. Too little stress (boredom) may also become stressful. SUGGESTIONS TO REDUCE STRESS:  Talk things over with your family and friends. It often is helpful to share your concerns and worries. If you feel your problem is serious, you may want to get help from a professional counselor.  Consider your problems one at a time instead of lumping them all together. Trying to take care of everything at once may seem impossible. List all the things you need to do and then start with the most important one. Set a goal to accomplish 2 or 3 things each day. If you expect to do too many in a single day you will naturally fail, causing you to feel even more stressed.  Do not use alcohol or drugs to relieve stress. Although you may feel better for a short time, they do not remove the problems that caused the stress. They can also be habit forming.  Exercise regularly - at least 3 times per week. Physical exercise can help to relieve that "uptight" feeling and will relax you.  The shortest distance between despair and hope is often a good night's sleep.  Go to bed and get up on time allowing  yourself time for appointments without being rushed.  Take a short "time-out" period from any stressful situation that occurs during the day. Close your eyes and take some deep breaths. Starting with the muscles in your face, tense them, hold it for a few seconds, then relax. Repeat this with the muscles in your neck, shoulders, hand, stomach, back and legs.  Take good care of yourself. Eat a balanced diet and get plenty of rest.  Schedule time for having fun. Take a break from your daily routine to relax. HOME CARE INSTRUCTIONS   Call if you feel overwhelmed by your problems and feel you can no longer manage them on your own.  Return immediately if you feel like hurting yourself or someone else. Document Released: 01/29/2001 Document Revised: 10/28/2011 Document Reviewed: 09/21/2007 [facility] Patient Information 2014 [place], [place].  DASH Diet The DASH diet stands for "Dietary Approaches to Stop Hypertension." It is a healthy eating plan that has been shown to reduce high blood pressure (hypertension) in as little as 14 days, while also possibly providing other significant health benefits. These other health benefits include reducing the risk of breast cancer after menopause and reducing the risk of type 2 diabetes, heart disease, colon cancer, and stroke. Health benefits also include weight loss and slowing kidney failure in patients with chronic kidney disease.  DIET GUIDELINES  Limit salt (sodium). Your diet should contain less than 1500 mg of  sodium daily.  Limit refined or processed carbohydrates. Your diet should include mostly whole grains. Desserts and added sugars should be used sparingly.  Include small amounts of heart-healthy fats. These types of fats include nuts, oils, and tub margarine. Limit saturated and trans fats. These fats have been shown to be harmful in the body. CHOOSING FOODS  The following food groups are based on a 2000 calorie diet. See your Registered Dietitian  for individual calorie needs. Grains and Grain Products (6 to 8 servings daily)  Eat More Often: Whole-wheat bread, brown rice, whole-grain or wheat pasta, quinoa, popcorn without added fat or salt (air popped).  Eat Less Often: White bread, white pasta, white rice, cornbread. Vegetables (4 to 5 servings daily)  Eat More Often: Fresh, frozen, and canned vegetables. Vegetables may be raw, steamed, roasted, or grilled with a minimal amount of fat.  Eat Less Often/Avoid: Creamed or fried vegetables. Vegetables in a cheese sauce. Fruit (4 to 5 servings daily)  Eat More Often: All fresh, canned (in natural juice), or frozen fruits. Dried fruits without added sugar. One hundred percent fruit juice ( cup [237 mL] daily).  Eat Less Often: Dried fruits with added sugar. Canned fruit in light or heavy syrup. [employer], Fish, and Poultry (2 servings or less daily. One serving is 3 to 4 oz [85-114 g]).  Eat More Often: Ninety percent or leaner ground beef, tenderloin, sirloin. Round cuts of beef, chicken breast, [place] breast. All fish. Grill, bake, or broil your meat. Nothing should be fried.  Eat Less Often/Avoid: Fatty cuts of meat, [place], or chicken leg, thigh, or wing. Fried cuts of meat or fish. Dairy (2 to 3 servings)  Eat More Often: Low-fat or fat-free milk, low-fat plain or light yogurt, reduced-fat or part-skim cheese.  Eat Less Often/Avoid: Milk (whole, 2%).Whole milk yogurt. Full-fat cheeses. Nuts, Seeds, and Legumes (4 to 5 servings per week)  Eat More Often: All without added salt.  Eat Less Often/Avoid: Salted nuts and seeds, canned beans with added salt. Fats and Sweets (limited)  Eat More Often: Vegetable oils, tub margarines without trans fats, sugar-free gelatin. Mayonnaise and salad dressings.  Eat Less Often/Avoid: Coconut oils, palm oils, butter, stick margarine, cream, half and half, cookies, candy, pie. FOR MORE INFORMATION The Dash Diet Eating Plan:  www.dashdiet.org Document Released: 07/25/2011 Document Revised: 10/28/2011 Document Reviewed: 07/25/2011 [facility] Patient Information 2014 [place], [place].

## 2013-05-20 NOTE — Progress Notes (Signed)
Patient ID: Kristi Willis, female   DOB: [birth year], 72 y.o.   MRN: [id number] SUBJECTIVE: CC: Chief Complaint  Patient presents with  . Follow-up    1 month followup for HTN. states got upset this am at work     HPI:  Patient is here for follow up of Diabetes Mellitus/HTN/HLD: Symptoms evaluated: Denies Nocturia ,Denies Urinary Frequency , denies Blurred vision ,deniesDizziness,denies.Dysuria,denies paresthesias, denies extremity pain or ulcers.Marland Kitchendenies chest pain. has had an annual eye exam. do check the feet. Does check CBGs. Average CBG:147 this am ate ice cream last night Denies episodes of hypoglycemia. Does have an emergency hypoglycemic plan. admits toCompliance with medications. Denies Problems with medications.  Stress at work today: her coworker's daughter admitted at [employer] with the Enterovirus. She has a little granddaughter.worried.    Past Medical History  Diagnosis Date  . SVT (supraventricular tachycardia)   . Hypertension   . Diabetes mellitus   . Hypercholesteremia   . Neuropathy   . Asthma   . GERD (gastroesophageal reflux disease)     hiatal hernia  . Cancer     skin  . Neuromuscular disorder     DM neuropathy  . Allergy    Past Surgical History  Procedure Laterality Date  . Lumbar laminectomy  08/10  . Spine surgery      lumbar laminectomy   History   Social History  . Marital Status: Married    Spouse Name: N/A    Number of Children: N/A  . Years of Education: N/A   Occupational History  . Retired from [employer] but still works part-time    Social History Main Topics  . Smoking status: Never Smoker   . Smokeless tobacco: Never Used  . Alcohol Use: No  . Drug Use: No  . Sexual Activity: Not on file   Other Topics Concern  . Not on file   Social History Narrative   Lives with husband, who is a patient of Dr Ladona Ridgel. She is active around the house and exercises regularly.   Family History  Problem Relation Age of Onset   . Cancer Mother   . Depression Mother   . Heart disease Father   . Stroke Father    Current Outpatient Prescriptions on File Prior to Visit  Medication Sig Dispense Refill  . albuterol (VENTOLIN HFA) 108 (90 BASE) MCG/ACT inhaler Inhale 2 puffs into the lungs every 6 (six) hours as needed for wheezing.      Marland Kitchen amLODipine (NORVASC) 5 MG tablet Take 0.5 tablets (2.5 mg total) by mouth daily.  30 tablet  3  . Calcium Carbonate-Vitamin D (CALTRATE 600+D) 600-400 MG-UNIT per tablet Take 1 tablet by mouth 2 (two) times daily.      . Cholecalciferol (VITAMIN D3) 1000 UNITS CAPS Take by mouth daily.        . citalopram (CELEXA) 20 MG tablet Take 1 tablet (20 mg total) by mouth daily.  30 tablet  5  . ibuprofen (ADVIL,MOTRIN) 200 MG tablet Take 400 mg by mouth every 6 (six) hours as needed. For pain      . lisinopril (PRINIVIL,ZESTRIL) 40 MG tablet Take 1 tablet (40 mg total) by mouth daily.  30 tablet  5  . metFORMIN (GLUCOPHAGE) 1000 MG tablet Take 1 tablet (1,000 mg total) by mouth 2 (two) times daily.  60 tablet  11  . metoprolol succinate (TOPROL-XL) 100 MG 24 hr tablet Take 1 tablet (100 mg total) by mouth 2 (two) times daily.  Take with or immediately following a meal.  60 tablet  11  . Multiple Vitamin (MULTIVITAMIN) tablet Take 1 tablet by mouth daily.        . Pyridoxine HCl (VITAMIN B-6 PO) Take 1 tablet by mouth 2 (two) times daily.      . rosuvastatin (CRESTOR) 20 MG tablet Take 1 tablet (20 mg total) by mouth daily.  30 tablet  5  . sitaGLIPtin (JANUVIA) 100 MG tablet Take 1 tablet (100 mg total) by mouth daily.  30 tablet  5  . vitamin B-12 (CYANOCOBALAMIN) 500 MCG tablet Take 500 mcg by mouth daily.         No current facility-administered medications on file prior to visit.   Allergies  Allergen Reactions  . Dimetapp C [Phenylephrine-Bromphen-Codeine] Itching  . Phenylephrine Hcl Other (See Comments)    unknown  . Ultram [Tramadol Hcl] Other (See Comments)    Insomnia  .  Celebrex [Celecoxib] Rash  . Cortisone Rash  . Fenofibrate Rash  . Sulfa Antibiotics Rash   Immunization History  Administered Date(s) Administered  . Influenza-Generic 08/20/2011  . Pneumococcal Polysaccharide 07/22/2012   Prior to Admission medications   Medication Sig Start Date End Date Taking? Authorizing Provider  albuterol (VENTOLIN HFA) 108 (90 BASE) MCG/ACT inhaler Inhale 2 puffs into the lungs every 6 (six) hours as needed for wheezing.   Yes Historical Provider, MD  amLODipine (NORVASC) 5 MG tablet Take 0.5 tablets (2.5 mg total) by mouth daily. 04/13/13  Yes Ileana Ladd, MD  azelastine (ASTELIN) 137 MCG/SPRAY nasal spray  05/03/13  Yes Historical Provider, MD  Calcium Carbonate-Vitamin D (CALTRATE 600+D) 600-400 MG-UNIT per tablet Take 1 tablet by mouth 2 (two) times daily.   Yes Historical Provider, MD  Cholecalciferol (VITAMIN D3) 1000 UNITS CAPS Take by mouth daily.     Yes Historical Provider, MD  citalopram (CELEXA) 20 MG tablet Take 1 tablet (20 mg total) by mouth daily. 04/13/13  Yes Ileana Ladd, MD  ibuprofen (ADVIL,MOTRIN) 200 MG tablet Take 400 mg by mouth every 6 (six) hours as needed. For pain   Yes Historical Provider, MD  lisinopril (PRINIVIL,ZESTRIL) 40 MG tablet Take 1 tablet (40 mg total) by mouth daily. 04/13/13  Yes Ileana Ladd, MD  metFORMIN (GLUCOPHAGE) 1000 MG tablet Take 1 tablet (1,000 mg total) by mouth 2 (two) times daily. 03/02/13  Yes Ileana Ladd, MD  metoprolol succinate (TOPROL-XL) 100 MG 24 hr tablet Take 1 tablet (100 mg total) by mouth 2 (two) times daily. Take with or immediately following a meal. 03/02/13  Yes Ileana Ladd, MD  montelukast (SINGULAIR) 10 MG tablet  05/03/13  Yes Historical Provider, MD  Multiple Vitamin (MULTIVITAMIN) tablet Take 1 tablet by mouth daily.     Yes Historical Provider, MD  Pyridoxine HCl (VITAMIN B-6 PO) Take 1 tablet by mouth 2 (two) times daily.   Yes Historical Provider, MD  rosuvastatin (CRESTOR) 20 MG  tablet Take 1 tablet (20 mg total) by mouth daily. 12/25/12  Yes Ileana Ladd, MD  sitaGLIPtin (JANUVIA) 100 MG tablet Take 1 tablet (100 mg total) by mouth daily. 03/02/13  Yes Ileana Ladd, MD  vitamin B-12 (CYANOCOBALAMIN) 500 MCG tablet Take 500 mcg by mouth daily.     Yes Historical Provider, MD    ROS: As above in the HPI. All other systems are stable or negative.  OBJECTIVE: APPEARANCE:  Patient in no acute distress.The patient appeared well nourished and normally developed.  Acyanotic. Waist: VITAL SIGNS:BP 200/73  Pulse 65  Temp(Src) 98 F (36.7 C) (Oral)  Wt 161 lb 12.8 oz (73.392 kg)  BMI 27.76 kg/m2  WF Recheck BP 200/70  SKIN: warm and  Dry without overt rashes, tattoos and scars  HEAD and Neck: without JVD, Head and scalp: normal Eyes:No scleral icterus. Fundi normal, eye movements normal. Ears: Auricle normal, canal normal, Tympanic membranes normal, insufflation normal. Nose: normal Throat: normal Neck & thyroid: normal  CHEST & LUNGS: Chest wall: normal Lungs: Clear  CVS: Reveals the PMI to be normally located. Regular rhythm, First and Second Heart sounds are normal,  absence of murmurs, rubs or gallops. Peripheral vasculature: Radial pulses: normal Dorsal pedis pulses: normal Posterior pulses: normal  ABDOMEN:  Appearance: normal Benign, no organomegaly, no masses, no Abdominal Aortic enlargement. No Guarding , no rebound. No Bruits. Bowel sounds: normal  RECTAL: N/A GU: N/A  EXTREMETIES: nonedematous.  MUSCULOSKELETAL:  Spine: reduced ROM doing good today , pain free. Joints: intact  NEUROLOGIC: oriented to time,place and person; nonfocal. Strength is normal Sensory is normal Reflexes are normal Cranial Nerves are normal.  Psychiatry: worried.  ASSESSMENT: Uncontrolled hypertension  Diabetes mellitus, type 2  Foot neuralgia, unspecified laterality  Hyperlipidemia  Right carotid bruit  Asthma  Degenerative disc  disease, lumbar - stable  Stress at home  Adjustment reaction  Pedal edema - resolved with reduced dose of amlodip[ine   PLAN: DASH Diet.  Stress management  Discussed hand washing and infection control with present concerns for enterovirus.  Medications Discontinued During This Encounter  Medication Reason  . amLODipine (NORVASC) 5 MG tablet Reorder    Meds ordered this encounter  Medications  . azelastine (ASTELIN) 137 MCG/SPRAY nasal spray    Sig:   . montelukast (SINGULAIR) 10 MG tablet    Sig:   . amLODipine (NORVASC) 5 MG tablet    Sig: Take 1 tablet (5 mg total) by mouth daily.    Dispense:  30 tablet    Refill:  3    Results for orders placed in visit on 03/30/13  [id number]      Result Value Range   Glucose 129 (*) 65 - 99 mg/dL   BUN 7 (*) 8 - 27 mg/dL   Creatinine, Ser 1.61  0.57 - 1.00 mg/dL   GFR calc non Af Amer 93  >59 mL/min/1.73   GFR calc Af Amer 107  >59 mL/min/1.73   BUN/Creatinine Ratio 12  11 - 26   Sodium 139  134 - 144 mmol/L   Potassium 4.2  3.5 - 5.2 mmol/L   Chloride 100  97 - 108 mmol/L   CO2 24  18 - 29 mmol/L   Calcium 9.5  8.6 - 10.2 mg/dL    Return in about 1 week (around 05/27/2013) for Recheck medical problems, recheck BP.  Keimora Swartout P. [place] Charon, M.D.

## 2013-05-27 ENCOUNTER — Ambulatory Visit (INDEPENDENT_AMBULATORY_CARE_PROVIDER_SITE_OTHER): Payer: Medicare Other | Admitting: Family Medicine

## 2013-05-27 ENCOUNTER — Encounter: Payer: Self-pay | Admitting: Family Medicine

## 2013-05-27 ENCOUNTER — Other Ambulatory Visit: Payer: Medicare Other

## 2013-05-27 ENCOUNTER — Encounter (INDEPENDENT_AMBULATORY_CARE_PROVIDER_SITE_OTHER): Payer: Self-pay

## 2013-05-27 VITALS — BP 184/64 | HR 62 | Temp 98.2°F | Ht 64.0 in | Wt 164.2 lb

## 2013-05-27 DIAGNOSIS — F439 Reaction to severe stress, unspecified: Secondary | ICD-10-CM

## 2013-05-27 DIAGNOSIS — Z639 Problem related to primary support group, unspecified: Secondary | ICD-10-CM

## 2013-05-27 DIAGNOSIS — E119 Type 2 diabetes mellitus without complications: Secondary | ICD-10-CM | POA: Insufficient documentation

## 2013-05-27 DIAGNOSIS — E785 Hyperlipidemia, unspecified: Secondary | ICD-10-CM

## 2013-05-27 DIAGNOSIS — J45909 Unspecified asthma, uncomplicated: Secondary | ICD-10-CM

## 2013-05-27 DIAGNOSIS — I10 Essential (primary) hypertension: Secondary | ICD-10-CM

## 2013-05-27 DIAGNOSIS — M5136 Other intervertebral disc degeneration, lumbar region: Secondary | ICD-10-CM

## 2013-05-27 DIAGNOSIS — M5137 Other intervertebral disc degeneration, lumbosacral region: Secondary | ICD-10-CM

## 2013-05-27 DIAGNOSIS — R0989 Other specified symptoms and signs involving the circulatory and respiratory systems: Secondary | ICD-10-CM

## 2013-05-27 DIAGNOSIS — E782 Mixed hyperlipidemia: Secondary | ICD-10-CM | POA: Insufficient documentation

## 2013-05-27 DIAGNOSIS — G5790 Unspecified mononeuropathy of unspecified lower limb: Secondary | ICD-10-CM

## 2013-05-27 DIAGNOSIS — M792 Neuralgia and neuritis, unspecified: Secondary | ICD-10-CM

## 2013-05-27 DIAGNOSIS — M51369 Other intervertebral disc degeneration, lumbar region without mention of lumbar back pain or lower extremity pain: Secondary | ICD-10-CM

## 2013-05-27 LAB — POCT GLYCOSYLATED HEMOGLOBIN (HGB A1C): Hemoglobin A1C: 6.4

## 2013-05-27 MED ORDER — TERAZOSIN HCL 2 MG PO CAPS: 2.0000 mg | ORAL_CAPSULE | Freq: Every day | ORAL | Status: DC

## 2013-05-27 NOTE — Progress Notes (Signed)
Patient ID: Kristi Willis, female   DOB: 11/26/1940, 72 y.o.   MRN: [id number] SUBJECTIVE: CC: Chief Complaint  Patient presents with  . Hypertension    1 week rck per FPW    HPI: Patient is here for follow up of hypertension/hyperlipidemia/DM: denies Headache;deniesChest Pain;denies weakness;denies Shortness of Breath or Orthopnea;denies Visual changes;denies palpitations;denies cough;denies pedal edema;denies symptoms of TIA or stroke; admits to Compliance with medications. denies Problems with medications.  CBG: 145 this am Patient is here for follow up of Diabetes Mellitus: Symptoms evaluated: Denies Nocturia ,Denies Urinary Frequency , denies Blurred vision ,deniesDizziness,denies.Dysuria,denies paresthesias, denies extremity pain or ulcers.Marland Kitchendenies chest pain. has had an annual eye exam. do check the feet. Does check CBGs. Average NWG:NFAOZHYQMV 110-140s Denies episodes of hypoglycemia. Does have an emergency hypoglycemic plan. admits toCompliance with medications. Denies Problems with medications.  Past Medical History  Diagnosis Date  . SVT (supraventricular tachycardia)   . Hypertension   . Diabetes mellitus   . Hypercholesteremia   . Neuropathy   . Asthma   . GERD (gastroesophageal reflux disease)     hiatal hernia  . Cancer     skin  . Neuromuscular disorder     DM neuropathy  . Allergy    Past Surgical History  Procedure Laterality Date  . Lumbar laminectomy  08/10  . Spine surgery      lumbar laminectomy   History   Social History  . Marital Status: Married    Spouse Name: N/A    Number of Children: N/A  . Years of Education: N/A   Occupational History  . Retired from [employer] but still works part-time    Social History Main Topics  . Smoking status: Never Smoker   . Smokeless tobacco: Never Used  . Alcohol Use: No  . Drug Use: No  . Sexual Activity: Not on file   Other Topics Concern  . Not on file   Social History Narrative    Lives with husband, who is a patient of Dr Ladona Ridgel. She is active around the house and exercises regularly.   Family History  Problem Relation Age of Onset  . Cancer Mother   . Depression Mother   . Heart disease Father   . Stroke Father    Current Outpatient Prescriptions on File Prior to Visit  Medication Sig Dispense Refill  . albuterol (VENTOLIN HFA) 108 (90 BASE) MCG/ACT inhaler Inhale 2 puffs into the lungs every 6 (six) hours as needed for wheezing.      Marland Kitchen amLODipine (NORVASC) 5 MG tablet Take 1 tablet (5 mg total) by mouth daily.  30 tablet  3  . azelastine (ASTELIN) 137 MCG/SPRAY nasal spray       . Calcium Carbonate-Vitamin D (CALTRATE 600+D) 600-400 MG-UNIT per tablet Take 1 tablet by mouth 2 (two) times daily.      . Cholecalciferol (VITAMIN D3) 1000 UNITS CAPS Take by mouth daily.        . citalopram (CELEXA) 20 MG tablet Take 1 tablet (20 mg total) by mouth daily.  30 tablet  5  . ibuprofen (ADVIL,MOTRIN) 200 MG tablet Take 400 mg by mouth every 6 (six) hours as needed. For pain      . lisinopril (PRINIVIL,ZESTRIL) 40 MG tablet Take 1 tablet (40 mg total) by mouth daily.  30 tablet  5  . metFORMIN (GLUCOPHAGE) 1000 MG tablet Take 1 tablet (1,000 mg total) by mouth 2 (two) times daily.  60 tablet  11  . metoprolol  succinate (TOPROL-XL) 100 MG 24 hr tablet Take 1 tablet (100 mg total) by mouth 2 (two) times daily. Take with or immediately following a meal.  60 tablet  11  . montelukast (SINGULAIR) 10 MG tablet       . Multiple Vitamin (MULTIVITAMIN) tablet Take 1 tablet by mouth daily.        . Pyridoxine HCl (VITAMIN B-6 PO) Take 1 tablet by mouth 2 (two) times daily.      . rosuvastatin (CRESTOR) 20 MG tablet Take 1 tablet (20 mg total) by mouth daily.  30 tablet  5  . sitaGLIPtin (JANUVIA) 100 MG tablet Take 1 tablet (100 mg total) by mouth daily.  30 tablet  5  . vitamin B-12 (CYANOCOBALAMIN) 500 MCG tablet Take 500 mcg by mouth daily.         No current  facility-administered medications on file prior to visit.   Allergies  Allergen Reactions  . Dimetapp C [Phenylephrine-Bromphen-Codeine] Itching  . Phenylephrine Hcl Other (See Comments)    unknown  . Ultram [Tramadol Hcl] Other (See Comments)    Insomnia  . Celebrex [Celecoxib] Rash  . Cortisone Rash  . Fenofibrate Rash  . Sulfa Antibiotics Rash   Immunization History  Administered Date(s) Administered  . Influenza,inj,Quad PF,36+ Mos 05/20/2013  . Influenza-Generic 08/20/2011  . Pneumococcal Polysaccharide 07/22/2012   Prior to Admission medications   Medication Sig Start Date End Date Taking? Authorizing Provider  albuterol (VENTOLIN HFA) 108 (90 BASE) MCG/ACT inhaler Inhale 2 puffs into the lungs every 6 (six) hours as needed for wheezing.   Yes Historical Provider, MD  amLODipine (NORVASC) 5 MG tablet Take 1 tablet (5 mg total) by mouth daily. 05/20/13  Yes Ileana Ladd, MD  azelastine (ASTELIN) 137 MCG/SPRAY nasal spray  05/03/13  Yes Historical Provider, MD  Calcium Carbonate-Vitamin D (CALTRATE 600+D) 600-400 MG-UNIT per tablet Take 1 tablet by mouth 2 (two) times daily.   Yes Historical Provider, MD  Cholecalciferol (VITAMIN D3) 1000 UNITS CAPS Take by mouth daily.     Yes Historical Provider, MD  citalopram (CELEXA) 20 MG tablet Take 1 tablet (20 mg total) by mouth daily. 04/13/13  Yes Ileana Ladd, MD  ibuprofen (ADVIL,MOTRIN) 200 MG tablet Take 400 mg by mouth every 6 (six) hours as needed. For pain   Yes Historical Provider, MD  lisinopril (PRINIVIL,ZESTRIL) 40 MG tablet Take 1 tablet (40 mg total) by mouth daily. 04/13/13  Yes Ileana Ladd, MD  metFORMIN (GLUCOPHAGE) 1000 MG tablet Take 1 tablet (1,000 mg total) by mouth 2 (two) times daily. 03/02/13  Yes Ileana Ladd, MD  metoprolol succinate (TOPROL-XL) 100 MG 24 hr tablet Take 1 tablet (100 mg total) by mouth 2 (two) times daily. Take with or immediately following a meal. 03/02/13  Yes Ileana Ladd, MD   montelukast (SINGULAIR) 10 MG tablet  05/03/13  Yes Historical Provider, MD  Multiple Vitamin (MULTIVITAMIN) tablet Take 1 tablet by mouth daily.     Yes Historical Provider, MD  Pyridoxine HCl (VITAMIN B-6 PO) Take 1 tablet by mouth 2 (two) times daily.   Yes Historical Provider, MD  rosuvastatin (CRESTOR) 20 MG tablet Take 1 tablet (20 mg total) by mouth daily. 12/25/12  Yes Ileana Ladd, MD  sitaGLIPtin (JANUVIA) 100 MG tablet Take 1 tablet (100 mg total) by mouth daily. 03/02/13  Yes Ileana Ladd, MD  vitamin B-12 (CYANOCOBALAMIN) 500 MCG tablet Take 500 mcg by mouth daily.  Yes Historical Provider, MD     ROS: As above in the HPI. All other systems are stable or negative.  OBJECTIVE: APPEARANCE:  Patient in no acute distress.The patient appeared well nourished and normally developed. Acyanotic. Waist: VITAL SIGNS:BP 184/64  Pulse 62  Temp(Src) 98.2 F (36.8 C) (Oral)  Ht 5\' 4"  (1.626 m)  Wt 164 lb 3.2 oz (74.481 kg)  BMI 28.17 kg/m2  WF  SKIN: warm and  Dry without overt rashes, tattoos and scars  HEAD and Neck: without JVD, Head and scalp: normal Eyes:No scleral icterus. Fundi normal, eye movements normal. Ears: Auricle normal, canal normal, Tympanic membranes normal, insufflation normal. Nose: normal Throat: normal Neck & thyroid: normal  CHEST & LUNGS: Chest wall: normal Lungs: Clear  CVS: Reveals the PMI to be normally located. Regular rhythm, First and Second Heart sounds are normal,  absence of murmurs, rubs or gallops. Peripheral vasculature: Radial pulses: normal Dorsal pedis pulses: normal Posterior pulses: normal  ABDOMEN:  Appearance: normal Benign, no organomegaly, no masses, no Abdominal Aortic enlargement. No Guarding , no rebound. No Bruits. Bowel sounds: normal  RECTAL: N/A GU: N/A  EXTREMETIES: nonedematous.  MUSCULOSKELETAL:  Spine: normal Joints: intact  NEUROLOGIC: oriented to time,place and person; nonfocal. Strength is  normal Sensory is abnormal: see previous foot exam. Cranial Nerves are normal.  Results for orders placed in visit on 05/27/13  POCT GLYCOSYLATED HEMOGLOBIN (HGB A1C)      Result Value Range   Hemoglobin A1C 6.4%      ASSESSMENT: Type II or unspecified type diabetes mellitus without mention of complication, not stated as uncontrolled - Plan: POCT glycosylated hemoglobin (Hb A1C), CMP14+EGFR, Lipid panel  Other and unspecified hyperlipidemia - Plan: POCT glycosylated hemoglobin (Hb A1C), CMP14+EGFR, Lipid panel  Foot neuralgia, unspecified laterality  Hypertension  Right carotid bruit  Degenerative disc disease, lumbar  Stress at home  Asthma  BP not at goal. Other problems stable  PLAN: Orders Placed This Encounter  Procedures  . CMP14+EGFR  . Lipid panel  . POCT glycosylated hemoglobin (Hb A1C)    Meds ordered this encounter  Medications  . terazosin (HYTRIN) 2 MG capsule    Sig: Take 1 capsule (2 mg total) by mouth at bedtime.    Dispense:  30 capsule    Refill:  5   Patient believes she has had the zostavax . Will check the date of that immunization. Diet reviewed and exercise.  Stress reduction. Counselled.  Return in about 3 months (around 08/27/2013) for recheck BP in 2 weeks with nurse, OV with Dr Modesto Charon in 3 months.  Kyng Matlock P. Modesto Charon, M.D.

## 2013-05-28 ENCOUNTER — Telehealth: Payer: Self-pay | Admitting: *Deleted

## 2013-05-28 LAB — CMP14+EGFR
ALT: 25 IU/L (ref 0–32)
AST: 34 IU/L (ref 0–40)
Albumin/Globulin Ratio: 2.3 (ref 1.1–2.5)
Albumin: 4.6 g/dL (ref 3.5–4.8)
Alkaline Phosphatase: 50 IU/L (ref 39–117)
BUN/Creatinine Ratio: 15 (ref 11–26)
BUN: 9 mg/dL (ref 8–27)
CO2: 25 mmol/L (ref 18–29)
Calcium: 9.1 mg/dL (ref 8.6–10.2)
Chloride: 98 mmol/L (ref 97–108)
Creatinine, Ser: 0.61 mg/dL (ref 0.57–1.00)
GFR calc Af Amer: 105 mL/min/{1.73_m2} (ref >59)
GFR calc non Af Amer: 92 mL/min/{1.73_m2} (ref >59)
Globulin, Total: 2 g/dL (ref 1.5–4.5)
Glucose: 140 mg/dL — ABNORMAL HIGH (ref 65–99)
Potassium: 4.4 mmol/L (ref 3.5–5.2)
Sodium: 138 mmol/L (ref 134–144)
Total Bilirubin: 0.4 mg/dL (ref 0.0–1.2)
Total Protein: 6.6 g/dL (ref 6.0–8.5)

## 2013-05-28 LAB — LIPID PANEL
Chol/HDL Ratio: 3.1 ratio units (ref 0.0–4.4)
Cholesterol, Total: 140 mg/dL (ref 100–199)
HDL: 45 mg/dL (ref >39)
LDL Calculated: 61 mg/dL (ref 0–99)
Triglycerides: 168 mg/dL — ABNORMAL HIGH (ref 0–149)
VLDL Cholesterol Cal: 34 mg/dL (ref 5–40)

## 2013-06-02 NOTE — Telephone Encounter (Signed)
Unsure why this was opened

## 2013-06-10 ENCOUNTER — Ambulatory Visit: Payer: Medicare Other

## 2013-06-10 VITALS — BP 140/62 | HR 55

## 2013-06-10 DIAGNOSIS — I10 Essential (primary) hypertension: Secondary | ICD-10-CM

## 2013-06-10 NOTE — Progress Notes (Signed)
Patient ID: Kristi Willis, female   DOB: [birth year], 72 y.o.   MRN: [id number] Patient returned to office today for a BP check. Advised patient we would show Dr Modesto Charon

## 2013-07-21 ENCOUNTER — Other Ambulatory Visit: Payer: Self-pay | Admitting: Family Medicine

## 2013-09-01 ENCOUNTER — Ambulatory Visit (INDEPENDENT_AMBULATORY_CARE_PROVIDER_SITE_OTHER): Payer: Medicare Other | Admitting: Family Medicine

## 2013-09-01 VITALS — BP 152/55 | HR 74 | Temp 97.2°F | Ht 64.0 in | Wt 166.6 lb

## 2013-09-01 DIAGNOSIS — J209 Acute bronchitis, unspecified: Secondary | ICD-10-CM

## 2013-09-01 MED ORDER — AZITHROMYCIN 250 MG PO TABS: ORAL_TABLET | ORAL | Status: DC

## 2013-09-01 MED ORDER — METHYLPREDNISOLONE (PAK) 4 MG PO TABS: ORAL_TABLET | ORAL | Status: DC

## 2013-09-01 MED ORDER — HYDROCODONE-HOMATROPINE 5-1.5 MG/5ML PO SYRP: 5.0000 mL | ORAL_SOLUTION | Freq: Three times a day (TID) | ORAL | Status: DC | PRN

## 2013-09-01 NOTE — Progress Notes (Signed)
   Subjective:    Patient ID: Kristi Willis, female    DOB: [birth year], 73 y.o.   MRN: [id number]  HPI  This 73 y.o. female presents for evaluation of cough and uri sx's.  She has hx of asthma and she Is having a lot wheezing and coughing..  Review of Systems C/o cough and uri sx's   No chest pain, SOB, HA, dizziness, vision change, N/V, diarrhea, constipation, dysuria, urinary urgency or frequency, myalgias, arthralgias or rash.  Objective:   Physical Exam Vital signs noted  Well developed well nourished female.  HEENT - Head atraumatic Normocephalic                Eyes - PERRLA, Conjuctiva - clear Sclera- Clear EOMI                Ears - EAC's Wnl TM's Wnl Gross Hearing WNL                Throat - oropharanx wnl Respiratory - Lungs CTA bilateral Cardiac - RRR S1 and S2 without murmur GI - Abdomen soft Nontender and bowel sounds active x 4 Extremities - No edema. Neuro - Grossly intact.       ssessment & Plan:  Acute bronchitis - Plan: HYDROcodone-homatropine (HYCODAN) 5-1.5 MG/5ML syrup, methylPREDNIsolone (MEDROL DOSPACK) 4 MG tablet, azithromycin (ZITHROMAX) 250 MG tablet Push po fluids, rest, tylenol and motrin otc prn as directed for fever, arthralgias, and myalgias.  Follow up prn if sx's continue or persist.  Lysbeth Penner FNP

## 2013-09-01 NOTE — Patient Instructions (Signed)

## 2013-09-02 ENCOUNTER — Ambulatory Visit: Payer: Medicare Other | Admitting: Family Medicine

## 2013-10-01 ENCOUNTER — Encounter: Payer: Self-pay | Admitting: *Deleted

## 2013-10-06 ENCOUNTER — Other Ambulatory Visit (INDEPENDENT_AMBULATORY_CARE_PROVIDER_SITE_OTHER): Payer: Medicare Other

## 2013-10-06 DIAGNOSIS — E785 Hyperlipidemia, unspecified: Secondary | ICD-10-CM

## 2013-10-06 DIAGNOSIS — I10 Essential (primary) hypertension: Secondary | ICD-10-CM

## 2013-10-06 DIAGNOSIS — E559 Vitamin D deficiency, unspecified: Secondary | ICD-10-CM

## 2013-10-06 DIAGNOSIS — E1059 Type 1 diabetes mellitus with other circulatory complications: Secondary | ICD-10-CM

## 2013-10-06 LAB — POCT GLYCOSYLATED HEMOGLOBIN (HGB A1C): Hemoglobin A1C: 7

## 2013-10-07 LAB — CMP14+EGFR
ALT: 20 IU/L (ref 0–32)
AST: 26 IU/L (ref 0–40)
Albumin/Globulin Ratio: 1.9 (ref 1.1–2.5)
Albumin: 4.3 g/dL (ref 3.5–4.8)
Alkaline Phosphatase: 61 IU/L (ref 39–117)
BUN/Creatinine Ratio: 17 (ref 11–26)
BUN: 9 mg/dL (ref 8–27)
CO2: 25 mmol/L (ref 18–29)
Calcium: 9.3 mg/dL (ref 8.7–10.3)
Chloride: 97 mmol/L (ref 97–108)
Creatinine, Ser: 0.53 mg/dL — ABNORMAL LOW (ref 0.57–1.00)
GFR calc Af Amer: 110 mL/min/{1.73_m2} (ref >59)
GFR calc non Af Amer: 95 mL/min/{1.73_m2} (ref >59)
Globulin, Total: 2.3 g/dL (ref 1.5–4.5)
Glucose: 135 mg/dL — ABNORMAL HIGH (ref 65–99)
Potassium: 4.4 mmol/L (ref 3.5–5.2)
Sodium: 141 mmol/L (ref 134–144)
Total Bilirubin: 0.3 mg/dL (ref 0.0–1.2)
Total Protein: 6.6 g/dL (ref 6.0–8.5)

## 2013-10-07 LAB — NMR, LIPOPROFILE
Cholesterol: 215 mg/dL — ABNORMAL HIGH (ref <200)
HDL Cholesterol by NMR: 51 mg/dL (ref >=40)
HDL Particle Number: 37.4 umol/L (ref >=30.5)
LDL Particle Number: 1886 nmol/L — ABNORMAL HIGH (ref <1000)
LDL Size: 20 nm — ABNORMAL LOW (ref >20.5)
LDLC SERPL CALC-MCNC: 119 mg/dL — ABNORMAL HIGH (ref <100)
LP-IR Score: 75 — ABNORMAL HIGH (ref <=45)
Small LDL Particle Number: 1191 nmol/L — ABNORMAL HIGH (ref <=527)
Triglycerides by NMR: 226 mg/dL — ABNORMAL HIGH (ref <150)

## 2013-10-07 LAB — VITAMIN D 25 HYDROXY (VIT D DEFICIENCY, FRACTURES): Vit D, 25-Hydroxy: 29.2 ng/mL — ABNORMAL LOW (ref 30.0–100.0)

## 2013-10-08 ENCOUNTER — Other Ambulatory Visit: Payer: Self-pay | Admitting: Family Medicine

## 2013-10-11 ENCOUNTER — Ambulatory Visit (INDEPENDENT_AMBULATORY_CARE_PROVIDER_SITE_OTHER): Payer: Medicare Other | Admitting: Family Medicine

## 2013-10-11 ENCOUNTER — Encounter: Payer: Self-pay | Admitting: Family Medicine

## 2013-10-11 VITALS — BP 140/67 | HR 72 | Temp 99.3°F | Ht 63.0 in | Wt 165.2 lb

## 2013-10-11 DIAGNOSIS — M51369 Other intervertebral disc degeneration, lumbar region without mention of lumbar back pain or lower extremity pain: Secondary | ICD-10-CM

## 2013-10-11 DIAGNOSIS — F4320 Adjustment disorder, unspecified: Secondary | ICD-10-CM

## 2013-10-11 DIAGNOSIS — F439 Reaction to severe stress, unspecified: Secondary | ICD-10-CM

## 2013-10-11 DIAGNOSIS — G5790 Unspecified mononeuropathy of unspecified lower limb: Secondary | ICD-10-CM

## 2013-10-11 DIAGNOSIS — M5137 Other intervertebral disc degeneration, lumbosacral region: Secondary | ICD-10-CM

## 2013-10-11 DIAGNOSIS — M792 Neuralgia and neuritis, unspecified: Secondary | ICD-10-CM

## 2013-10-11 DIAGNOSIS — R0989 Other specified symptoms and signs involving the circulatory and respiratory systems: Secondary | ICD-10-CM

## 2013-10-11 DIAGNOSIS — E785 Hyperlipidemia, unspecified: Secondary | ICD-10-CM

## 2013-10-11 DIAGNOSIS — E559 Vitamin D deficiency, unspecified: Secondary | ICD-10-CM

## 2013-10-11 DIAGNOSIS — Z639 Problem related to primary support group, unspecified: Secondary | ICD-10-CM

## 2013-10-11 DIAGNOSIS — I10 Essential (primary) hypertension: Secondary | ICD-10-CM

## 2013-10-11 DIAGNOSIS — M5136 Other intervertebral disc degeneration, lumbar region: Secondary | ICD-10-CM

## 2013-10-11 DIAGNOSIS — E119 Type 2 diabetes mellitus without complications: Secondary | ICD-10-CM

## 2013-10-11 NOTE — Patient Instructions (Signed)
      Dr Mya Suell's Recommendations  For nutrition information, I recommend books:  1).Eat to Live by Dr Joel Fuhrman. 2).Prevent and Reverse Heart Disease by Dr Caldwell Esselstyn. 3) Dr Neal Barnard's Book:  Program to Reverse Diabetes  Exercise recommendations are:  If unable to walk, then the patient can exercise in a chair 3 times a day. By flapping arms like a bird gently and raising legs outwards to the front.  If ambulatory, the patient can go for walks for 30 minutes 3 times a week. Then increase the intensity and duration as tolerated.  Goal is to try to attain exercise frequency to 5 times a week.  If applicable: Best to perform resistance exercises (machines or weights) 2 days a week and cardio type exercises 3 days per week.  

## 2013-10-11 NOTE — Progress Notes (Signed)
Patient ID: Kristi Willis, female   DOB: 1940-09-16, 73 y.o.   MRN: [id number] SUBJECTIVE: CC: Chief Complaint  Patient presents with  . Follow-up    4 month follow up discuss labs     HPI: Patient is here for follow up of Diabetes Mellitus/HLD/HTN: Symptoms evaluated: Denies Nocturia ,Denies Urinary Frequency , denies Blurred vision ,deniesDizziness,denies.Dysuria,denies paresthesias,  extremity pain: no change in the neuropathy. No ulcers. denies chest pain. has had an annual eye exam. do check the feet. Does check CBGs. Average CBG: fluctuates and going up slowly due to holiday eating. Denies episodes of hypoglycemia. Does have an emergency hypoglycemic plan. admits toCompliance with medications. Problems with medications.nauseous with certain medications. Goes a way with food and time  Her HGBA1C went up due to holiday eating.  Past Medical History  Diagnosis Date  . SVT (supraventricular tachycardia)   . Hypertension   . Diabetes mellitus   . Hypercholesteremia   . Neuropathy   . Asthma   . GERD (gastroesophageal reflux disease)     hiatal hernia  . Cancer     skin  . Neuromuscular disorder     DM neuropathy  . Allergy    Past Surgical History  Procedure Laterality Date  . Lumbar laminectomy  08/10  . Spine surgery      lumbar laminectomy   History   Social History  . Marital Status: Married    Spouse Name: N/A    Number of Children: N/A  . Years of Education: N/A   Occupational History  . Retired from [employer] but still works part-time    Social History Main Topics  . Smoking status: Never Smoker   . Smokeless tobacco: Never Used  . Alcohol Use: No  . Drug Use: No  . Sexual Activity: Not on file   Other Topics Concern  . Not on file   Social History Narrative   Lives with husband, who is a patient of Dr Lovena Le. She is active around the house and exercises regularly.   Family History  Problem Relation Age of Onset  . Cancer Mother   .  Depression Mother   . Heart disease Father   . Stroke Father    Current Outpatient Prescriptions on File Prior to Visit  Medication Sig Dispense Refill  . albuterol (VENTOLIN HFA) 108 (90 BASE) MCG/ACT inhaler Inhale 2 puffs into the lungs every 6 (six) hours as needed for wheezing.      Marland Kitchen amLODipine (NORVASC) 5 MG tablet TAKE 1 TABLET DAILY  30 tablet  3  . azelastine (ASTELIN) 137 MCG/SPRAY nasal spray       . Calcium Carbonate-Vitamin D (CALTRATE 600+D) 600-400 MG-UNIT per tablet Take 1 tablet by mouth 2 (two) times daily.      . Cholecalciferol (VITAMIN D3) 1000 UNITS CAPS Take by mouth daily.        . citalopram (CELEXA) 20 MG tablet Take 1 tablet (20 mg total) by mouth daily.  30 tablet  5  . lisinopril (PRINIVIL,ZESTRIL) 40 MG tablet Take 1 tablet (40 mg total) by mouth daily.  30 tablet  5  . metFORMIN (GLUCOPHAGE) 1000 MG tablet Take 1 tablet (1,000 mg total) by mouth 2 (two) times daily.  60 tablet  11  . metoprolol succinate (TOPROL-XL) 100 MG 24 hr tablet Take 1 tablet (100 mg total) by mouth 2 (two) times daily. Take with or immediately following a meal.  60 tablet  11  . Multiple Vitamin (MULTIVITAMIN) tablet  Take 1 tablet by mouth daily.        . rosuvastatin (CRESTOR) 20 MG tablet Take 1 tablet (20 mg total) by mouth daily.  30 tablet  5  . sitaGLIPtin (JANUVIA) 100 MG tablet Take 1 tablet (100 mg total) by mouth daily.  30 tablet  5  . terazosin (HYTRIN) 2 MG capsule Take 1 capsule (2 mg total) by mouth at bedtime.  30 capsule  5  . vitamin B-12 (CYANOCOBALAMIN) 500 MCG tablet Take 500 mcg by mouth daily.        . montelukast (SINGULAIR) 10 MG tablet       . Pyridoxine HCl (VITAMIN B-6 PO) Take 1 tablet by mouth 2 (two) times daily.       No current facility-administered medications on file prior to visit.   Allergies  Allergen Reactions  . Dimetapp C [Phenylephrine-Bromphen-Codeine] Itching  . Phenylephrine Hcl Other (See Comments)    unknown  . Ultram [Tramadol Hcl]  Other (See Comments)    Insomnia  . Celebrex [Celecoxib] Rash  . Cortisone Rash  . Fenofibrate Rash  . Sulfa Antibiotics Rash   Immunization History  Administered Date(s) Administered  . Influenza,inj,Quad PF,36+ Mos 05/20/2013  . Influenza-Unspecified 08/20/2011  . Pneumococcal Polysaccharide-23 07/22/2012   Prior to Admission medications   Medication Sig Start Date End Date Taking? Authorizing Provider  albuterol (VENTOLIN HFA) 108 (90 BASE) MCG/ACT inhaler Inhale 2 puffs into the lungs every 6 (six) hours as needed for wheezing.   Yes Historical Provider, MD  amLODipine (NORVASC) 5 MG tablet TAKE 1 TABLET DAILY 07/21/13  Yes Vernie Shanks, MD  azelastine (ASTELIN) 137 MCG/SPRAY nasal spray  05/03/13  Yes Historical Provider, MD  Calcium Carbonate-Vitamin D (CALTRATE 600+D) 600-400 MG-UNIT per tablet Take 1 tablet by mouth 2 (two) times daily.   Yes Historical Provider, MD  Cholecalciferol (VITAMIN D3) 1000 UNITS CAPS Take by mouth daily.     Yes Historical Provider, MD  citalopram (CELEXA) 20 MG tablet Take 1 tablet (20 mg total) by mouth daily. 04/13/13  Yes Vernie Shanks, MD  lisinopril (PRINIVIL,ZESTRIL) 40 MG tablet Take 1 tablet (40 mg total) by mouth daily. 04/13/13  Yes Vernie Shanks, MD  metFORMIN (GLUCOPHAGE) 1000 MG tablet Take 1 tablet (1,000 mg total) by mouth 2 (two) times daily. 03/02/13  Yes Vernie Shanks, MD  metoprolol succinate (TOPROL-XL) 100 MG 24 hr tablet Take 1 tablet (100 mg total) by mouth 2 (two) times daily. Take with or immediately following a meal. 03/02/13  Yes Vernie Shanks, MD  Multiple Vitamin (MULTIVITAMIN) tablet Take 1 tablet by mouth daily.     Yes Historical Provider, MD  rosuvastatin (CRESTOR) 20 MG tablet Take 1 tablet (20 mg total) by mouth daily. 12/25/12  Yes Vernie Shanks, MD  sitaGLIPtin (JANUVIA) 100 MG tablet Take 1 tablet (100 mg total) by mouth daily. 03/02/13  Yes Vernie Shanks, MD  terazosin (HYTRIN) 2 MG capsule Take 1 capsule (2 mg  total) by mouth at bedtime. 05/27/13  Yes Vernie Shanks, MD  vitamin B-12 (CYANOCOBALAMIN) 500 MCG tablet Take 500 mcg by mouth daily.     Yes Historical Provider, MD  azithromycin (ZITHROMAX) 250 MG tablet Take 2 po first day and then one po qd x 4 days 09/01/13   Lysbeth Penner, FNP  HYDROcodone-homatropine [facility]) 5-1.5 MG/5ML syrup Take 5 mLs by mouth every 8 (eight) hours as needed for cough. 09/01/13   Lysbeth Penner,  FNP  methylPREDNIsolone (MEDROL DOSPACK) 4 MG tablet follow package directions 09/01/13   Lysbeth Penner, FNP  montelukast (SINGULAIR) 10 MG tablet  05/03/13   Historical Provider, MD  Pyridoxine HCl (VITAMIN B-6 PO) Take 1 tablet by mouth 2 (two) times daily.    Historical Provider, MD     ROS: As above in the HPI. All other systems are stable or negative.  OBJECTIVE: APPEARANCE:  Patient in no acute distress.The patient appeared well nourished and normally developed. Acyanotic. Waist: VITAL SIGNS:BP 140/67  Pulse 72  Temp(Src) 99.3 F (37.4 C) (Oral)  Ht '5\' 3"'  (1.6 m)  Wt 165 lb 3.2 oz (74.934 kg)  BMI 29.27 kg/m2  WF  SKIN: warm and  Dry without overt rashes, tattoos and scars  HEAD and Neck: without JVD, Head and scalp: normal Eyes:No scleral icterus. Fundi normal, eye movements normal. Ears: Auricle normal, canal normal, Tympanic membranes normal, insufflation normal. Nose: normal Throat: normal Neck & thyroid: normal  CHEST & LUNGS: Chest wall: normal Lungs: Clear  CVS: Reveals the PMI to be normally located. Regular rhythm, First and Second Heart sounds are normal,  absence of murmurs, rubs or gallops. Peripheral vasculature: Radial pulses: normal Dorsal pedis pulses: normal Posterior pulses: normal  ABDOMEN:  Appearance: normal Benign, no organomegaly, no masses, no Abdominal Aortic enlargement. No Guarding , no rebound. No Bruits. Bowel sounds: normal  RECTAL: N/A GU: N/A  EXTREMETIES: nonedematous.  MUSCULOSKELETAL:   Spine: reduced ROM. Joints: intact  NEUROLOGIC: oriented to time,place and person; no changes.   Results for orders placed in visit on 10/06/13  CMP14+EGFR      Result Value Ref Range   Glucose 135 (*) 65 - 99 mg/dL   BUN 9  8 - 27 mg/dL   Creatinine, Ser 0.53 (*) 0.57 - 1.00 mg/dL   GFR calc non Af Amer 95  >59 mL/min/1.73   GFR calc Af Amer 110  >59 mL/min/1.73   BUN/Creatinine Ratio 17  11 - 26   Sodium 141  134 - 144 mmol/L   Potassium 4.4  3.5 - 5.2 mmol/L   Chloride 97  97 - 108 mmol/L   CO2 25  18 - 29 mmol/L   Calcium 9.3  8.7 - 10.3 mg/dL   Total Protein 6.6  6.0 - 8.5 g/dL   Albumin 4.3  3.5 - 4.8 g/dL   Globulin, Total 2.3  1.5 - 4.5 g/dL   Albumin/Globulin Ratio 1.9  1.1 - 2.5   Total Bilirubin 0.3  0.0 - 1.2 mg/dL   Alkaline Phosphatase 61  39 - 117 IU/L   AST 26  0 - 40 IU/L   ALT 20  0 - 32 IU/L  NMR, LIPOPROFILE      Result Value Ref Range   LDL Particle Number 1886 (*) <1000 nmol/L   LDLC SERPL CALC-MCNC 119 (*) <100 mg/dL   HDL Cholesterol by NMR 51  >=40 mg/dL   Triglycerides by NMR 226 (*) <150 mg/dL   Cholesterol 215 (*) <200 mg/dL   HDL Particle Number 37.4  >=30.5 umol/L   Small LDL Particle Number 1191 (*) <=527 nmol/L   LDL Size 20.0 (*) >20.5 nm   LP-IR Score 75 (*) <=45  VITAMIN D 25 HYDROXY      Result Value Ref Range   Vit D, 25-Hydroxy 29.2 (*) 30.0 - 100.0 ng/mL  POCT GLYCOSYLATED HEMOGLOBIN (HGB A1C)      Result Value Ref Range   Hemoglobin A1C 7.0  ASSESSMENT:  Right carotid bruit  Hypertension  Foot neuralgia  Adjustment reaction  Diabetes mellitus, type 2 - Plan: POCT glycosylated hemoglobin (Hb A1C), CMP14+EGFR  Hyperlipidemia - Plan: CMP14+EGFR, NMR, lipoprofile  Degenerative disc disease, lumbar  Unspecified vitamin D deficiency - Plan: Vit D  25 hydroxy (rtn osteoporosis monitoring)  Stress at home  PLAN: Discussed labs with patient and the rise in her HGBA1C. She plans to get back on her diet and  exercise program. No change in medications and await to see if she was successful with returning to diet and exercise. She will see at her next visit with a new provider.      Dr Paula Libra Recommendations  For nutrition information, I recommend books:  1).Eat to Live by Dr Excell Seltzer. 2).Prevent and Reverse Heart Disease by Dr Karl Luke. 3) Dr Janene Harvey Book:  Program to Reverse Diabetes  Exercise recommendations are:  If unable to walk, then the patient can exercise in a chair 3 times a day. By flapping arms like a bird gently and raising legs outwards to the front.  If ambulatory, the patient can go for walks for 30 minutes 3 times a week. Then increase the intensity and duration as tolerated.  Goal is to try to attain exercise frequency to 5 times a week.  If applicable: Best to perform resistance exercises (machines or weights) 2 days a week and cardio type exercises 3 days per week.  Orders Placed This Encounter  Procedures  . CMP14+EGFR    Standing Status: Future     Number of Occurrences:      Standing Expiration Date: 10/11/2014    Order Specific Question:  Has the patient fasted?    Answer:  Yes  . NMR, lipoprofile    Standing Status: Future     Number of Occurrences:      Standing Expiration Date: 10/11/2014  . Vit D  25 hydroxy (rtn osteoporosis monitoring)    Standing Status: Future     Number of Occurrences:      Standing Expiration Date: 10/11/2014  . POCT glycosylated hemoglobin (Hb A1C)    Standing Status: Future     Number of Occurrences:      Standing Expiration Date: 02/08/2014   No orders of the defined types were placed in this encounter.   Medications Discontinued During This Encounter  Medication Reason  . methylPREDNIsolone (MEDROL DOSPACK) 4 MG tablet Completed Course  . HYDROcodone-homatropine (HYCODAN) 5-1.5 MG/5ML syrup Completed Course  . azithromycin (ZITHROMAX) 250 MG tablet Completed Course   Return in about 3 months  (around 01/08/2014) for Recheck medical problems.  Esaul Dorwart P. Jacelyn Grip, M.D.

## 2013-10-12 ENCOUNTER — Ambulatory Visit: Payer: Medicare Other | Admitting: Family Medicine

## 2013-12-02 ENCOUNTER — Other Ambulatory Visit: Payer: Self-pay | Admitting: Family Medicine

## 2013-12-09 ENCOUNTER — Other Ambulatory Visit: Payer: Self-pay | Admitting: Family Medicine

## 2013-12-27 ENCOUNTER — Other Ambulatory Visit: Payer: Self-pay | Admitting: *Deleted

## 2013-12-27 MED ORDER — GLUCOSE BLOOD VI STRP: 1.0000 | ORAL_STRIP | Freq: Three times a day (TID) | Status: DC

## 2014-01-05 ENCOUNTER — Other Ambulatory Visit: Payer: Self-pay | Admitting: *Deleted

## 2014-01-05 DIAGNOSIS — F4320 Adjustment disorder, unspecified: Secondary | ICD-10-CM

## 2014-01-05 MED ORDER — CITALOPRAM HYDROBROMIDE 20 MG PO TABS: 20.0000 mg | ORAL_TABLET | Freq: Every day | ORAL | Status: DC

## 2014-03-18 ENCOUNTER — Other Ambulatory Visit: Payer: Self-pay | Admitting: *Deleted

## 2014-03-18 MED ORDER — METFORMIN HCL 1000 MG PO TABS: 1000.0000 mg | ORAL_TABLET | Freq: Two times a day (BID) | ORAL | Status: DC

## 2014-03-25 ENCOUNTER — Other Ambulatory Visit: Payer: Self-pay

## 2014-03-25 MED ORDER — METOPROLOL SUCCINATE ER 100 MG PO TB24: 100.0000 mg | ORAL_TABLET | Freq: Two times a day (BID) | ORAL | Status: DC

## 2014-03-25 NOTE — Telephone Encounter (Signed)
no more refills without being seen  

## 2014-03-25 NOTE — Telephone Encounter (Signed)
Last seen 10/11/13  [facility]

## 2014-04-11 ENCOUNTER — Other Ambulatory Visit: Payer: Self-pay | Admitting: Family Medicine

## 2014-04-27 ENCOUNTER — Other Ambulatory Visit: Payer: Self-pay | Admitting: Nurse Practitioner

## 2014-05-02 ENCOUNTER — Other Ambulatory Visit: Payer: Self-pay | Admitting: *Deleted

## 2014-05-02 MED ORDER — TERAZOSIN HCL 2 MG PO CAPS: ORAL_CAPSULE | ORAL | Status: DC

## 2014-05-02 NOTE — Telephone Encounter (Signed)
Last ov 2/15 with Dr. Jacelyn Grip. Has appt with Dr. Sabra Heck 05/05/14.

## 2014-05-05 ENCOUNTER — Ambulatory Visit: Payer: Medicare Other | Admitting: Family Medicine

## 2014-05-11 ENCOUNTER — Other Ambulatory Visit: Payer: Self-pay | Admitting: Family Medicine

## 2014-05-26 ENCOUNTER — Other Ambulatory Visit: Payer: Self-pay | Admitting: *Deleted

## 2014-05-26 MED ORDER — LISINOPRIL 40 MG PO TABS: ORAL_TABLET | ORAL | Status: DC

## 2014-05-31 ENCOUNTER — Encounter: Payer: Self-pay | Admitting: Family Medicine

## 2014-05-31 ENCOUNTER — Ambulatory Visit (INDEPENDENT_AMBULATORY_CARE_PROVIDER_SITE_OTHER): Payer: Medicare Other | Admitting: Family Medicine

## 2014-05-31 VITALS — BP 149/50 | HR 71 | Temp 99.1°F | Ht 63.0 in | Wt 166.0 lb

## 2014-05-31 DIAGNOSIS — E785 Hyperlipidemia, unspecified: Secondary | ICD-10-CM

## 2014-05-31 DIAGNOSIS — I10 Essential (primary) hypertension: Secondary | ICD-10-CM

## 2014-05-31 DIAGNOSIS — E118 Type 2 diabetes mellitus with unspecified complications: Secondary | ICD-10-CM

## 2014-05-31 DIAGNOSIS — Z23 Encounter for immunization: Secondary | ICD-10-CM

## 2014-05-31 LAB — POCT UA - MICROALBUMIN: MICROALBUMIN (UR) POC: NEGATIVE mg/L

## 2014-05-31 LAB — POCT GLYCOSYLATED HEMOGLOBIN (HGB A1C): HEMOGLOBIN A1C: 6.8

## 2014-05-31 MED ORDER — LISINOPRIL 40 MG PO TABS: 40.0000 mg | ORAL_TABLET | Freq: Every day | ORAL | Status: DC

## 2014-05-31 MED ORDER — METFORMIN HCL 1000 MG PO TABS: 1000.0000 mg | ORAL_TABLET | Freq: Two times a day (BID) | ORAL | Status: DC

## 2014-05-31 MED ORDER — METOPROLOL SUCCINATE ER 100 MG PO TB24: 100.0000 mg | ORAL_TABLET | Freq: Every day | ORAL | Status: DC

## 2014-05-31 MED ORDER — AMLODIPINE BESYLATE 5 MG PO TABS: 5.0000 mg | ORAL_TABLET | Freq: Every day | ORAL | Status: DC

## 2014-05-31 MED ORDER — CITALOPRAM HYDROBROMIDE 20 MG PO TABS: 20.0000 mg | ORAL_TABLET | Freq: Every day | ORAL | Status: DC

## 2014-05-31 MED ORDER — MONTELUKAST SODIUM 10 MG PO TABS: 10.0000 mg | ORAL_TABLET | Freq: Every day | ORAL | Status: DC

## 2014-05-31 NOTE — Progress Notes (Signed)
Subjective:    Patient ID: Kristi Willis, female    DOB: 1940-08-22, 73 y.o.   MRN: [id number]  HPI  73 year old female here to followup diabetes, hypertension, and heart disease. She discontinued her Crestor about 3 months ago and we'll check that today to see if she really needs a statin to she has no specific complaints as this is a routine scheduled visit and she does mention her right shoulder. This sounds like she may have some bursitis or tendinitis based on symptoms.    Review of Systems  Constitutional: Negative.   HENT: Negative.   Eyes: Negative.   Respiratory: Negative.   Cardiovascular: Negative.   Gastrointestinal: Negative.   Endocrine: Negative.   Genitourinary: Negative.   Musculoskeletal: Positive for myalgias.       Bilateral thumb pain  Skin: Rash: not true rash but healing intertrigo.  Hematological: Negative.   Psychiatric/Behavioral: Negative.        Objective:   Physical Exam  Constitutional: She is oriented to person, place, and time. She appears well-developed and well-nourished.  Eyes: Conjunctivae and EOM are normal.  Neck: Normal range of motion. Neck supple.  Cardiovascular: Normal rate, regular rhythm and normal heart sounds.   Pulmonary/Chest: Effort normal and breath sounds normal.  Abdominal: Soft. Bowel sounds are normal.  Musculoskeletal: Normal range of motion.  Neurological: She is alert and oriented to person, place, and time. She has normal reflexes.  Skin: Skin is warm and dry.  Psychiatric: She has a normal mood and affect. Her behavior is normal. Thought content normal.    BP 149/50  Pulse 71  Temp(Src) 99.1 F (37.3 C) (Oral)  Ht 5' 3" (1.6 m)  Wt 166 lb (75.297 kg)  BMI 29.41 kg/m2      Assessment & Plan:  1. Uncontrolled hypertension Continue same meds  2. Type 2 diabetes mellitus with complication  - POCT glycosylated hemoglobin (Hb A1C) - POCT UA - Microalbumin - CMP14+EGFR  3. Hyperlipidemia Check  labs off meds to see if she requires - Lipid panel  Wardell Honour MD

## 2014-06-01 ENCOUNTER — Telehealth: Payer: Self-pay | Admitting: Family Medicine

## 2014-06-01 LAB — CMP14+EGFR
A/G RATIO: 1.7 (ref 1.1–2.5)
ALT: 21 IU/L (ref 0–32)
AST: 29 IU/L (ref 0–40)
Albumin: 4.4 g/dL (ref 3.5–4.8)
Alkaline Phosphatase: 63 IU/L (ref 39–117)
BUN/Creatinine Ratio: 16 (ref 11–26)
BUN: 10 mg/dL (ref 8–27)
CALCIUM: 9.4 mg/dL (ref 8.7–10.3)
CHLORIDE: 96 mmol/L — AB (ref 97–108)
CO2: 23 mmol/L (ref 18–29)
Creatinine, Ser: 0.61 mg/dL (ref 0.57–1.00)
GFR calc Af Amer: 105 mL/min/{1.73_m2} (ref >59)
GFR calc non Af Amer: 91 mL/min/{1.73_m2} (ref >59)
GLUCOSE: 109 mg/dL — AB (ref 65–99)
Globulin, Total: 2.6 g/dL (ref 1.5–4.5)
POTASSIUM: 4 mmol/L (ref 3.5–5.2)
SODIUM: 140 mmol/L (ref 134–144)
TOTAL PROTEIN: 7 g/dL (ref 6.0–8.5)
Total Bilirubin: 0.3 mg/dL (ref 0.0–1.2)

## 2014-06-01 LAB — LIPID PANEL
CHOLESTEROL TOTAL: 208 mg/dL — AB (ref 100–199)
Chol/HDL Ratio: 4.6 ratio units — ABNORMAL HIGH (ref 0.0–4.4)
HDL: 45 mg/dL (ref >39)
LDL Calculated: 97 mg/dL (ref 0–99)
Triglycerides: 330 mg/dL — ABNORMAL HIGH (ref 0–149)
VLDL CHOLESTEROL CAL: 66 mg/dL — AB (ref 5–40)

## 2014-06-01 NOTE — Telephone Encounter (Signed)
Message copied by Waverly Ferrari on Wed Jun 01, 2014  9:44 AM ------      Message from: Wardell Honour      Created: Wed Jun 01, 2014  7:47 AM       A1c and microalbumin are normal. Renal function and liver enzymes were normal. Regarding cholesterol, LDL is at goal for triglycerides are too high indicating she should try to use less fat in her diet ------

## 2014-06-06 ENCOUNTER — Telehealth: Payer: Self-pay | Admitting: *Deleted

## 2014-06-06 NOTE — Telephone Encounter (Signed)
Aware. 

## 2014-06-29 ENCOUNTER — Telehealth: Payer: Self-pay | Admitting: *Deleted

## 2014-06-29 MED ORDER — METOPROLOL SUCCINATE ER 100 MG PO TB24: 100.0000 mg | ORAL_TABLET | Freq: Two times a day (BID) | ORAL | Status: DC

## 2014-06-29 NOTE — Telephone Encounter (Signed)
done

## 2014-08-22 ENCOUNTER — Telehealth: Payer: Self-pay | Admitting: Family Medicine

## 2014-08-22 NOTE — Telephone Encounter (Signed)
Stp advised we don't have any openings today or in the morning but she can call back first thing tomorrow to check for any cancellations, pt voiced understanding. Will close encounter.

## 2014-08-24 ENCOUNTER — Ambulatory Visit (INDEPENDENT_AMBULATORY_CARE_PROVIDER_SITE_OTHER): Payer: 59 | Admitting: Family Medicine

## 2014-08-24 ENCOUNTER — Telehealth: Payer: Self-pay | Admitting: *Deleted

## 2014-08-24 ENCOUNTER — Encounter: Payer: Self-pay | Admitting: Family Medicine

## 2014-08-24 VITALS — BP 159/60 | HR 78 | Temp 100.4°F | Ht 63.0 in | Wt 167.8 lb

## 2014-08-24 DIAGNOSIS — J206 Acute bronchitis due to rhinovirus: Secondary | ICD-10-CM

## 2014-08-24 MED ORDER — AZITHROMYCIN 250 MG PO TABS: ORAL_TABLET | ORAL | Status: DC

## 2014-08-24 MED ORDER — LORATADINE 10 MG PO TABS: 10.0000 mg | ORAL_TABLET | Freq: Every day | ORAL | Status: DC

## 2014-08-24 MED ORDER — MONTELUKAST SODIUM 10 MG PO TABS: 10.0000 mg | ORAL_TABLET | Freq: Every day | ORAL | Status: DC

## 2014-08-24 MED ORDER — ALBUTEROL SULFATE HFA 108 (90 BASE) MCG/ACT IN AERS: 2.0000 | INHALATION_SPRAY | Freq: Four times a day (QID) | RESPIRATORY_TRACT | Status: DC | PRN

## 2014-08-24 MED ORDER — HYDROCODONE-HOMATROPINE 5-1.5 MG/5ML PO SYRP: 5.0000 mL | ORAL_SOLUTION | Freq: Three times a day (TID) | ORAL | Status: DC | PRN

## 2014-08-24 MED ORDER — METHYLPREDNISOLONE (PAK) 4 MG PO TABS: ORAL_TABLET | ORAL | Status: DC

## 2014-08-24 MED ORDER — AZELASTINE HCL 0.1 % NA SOLN: 2.0000 | Freq: Two times a day (BID) | NASAL | Status: DC

## 2014-08-24 NOTE — Addendum Note (Signed)
Addended by: Shelbie Ammons on: 08/24/2014 07:02 PM   Modules accepted: Orders

## 2014-08-24 NOTE — Telephone Encounter (Signed)
Script of hycodan syrup, prednisone,  Z-pk called to [facility] per patient request.  [facility] was closed and these medications will be cancelled with them.

## 2014-08-24 NOTE — Progress Notes (Signed)
   Subjective:    Patient ID: Kristi Willis, female    DOB: [birth year], 74 y.o.   MRN: [id number]  HPI Patient is here for c/o uri sx's and cough.  Review of Systems  Constitutional: Negative for fever.  HENT: Negative for ear pain.   Eyes: Negative for discharge.  Respiratory: Negative for cough.   Cardiovascular: Negative for chest pain.  Gastrointestinal: Negative for abdominal distention.  Endocrine: Negative for polyuria.  Genitourinary: Negative for difficulty urinating.  Musculoskeletal: Negative for gait problem and neck pain.  Skin: Negative for color change and rash.  Neurological: Negative for speech difficulty and headaches.  Psychiatric/Behavioral: Negative for agitation.       Objective:    BP 159/60 mmHg  Pulse 78  Temp(Src) 100.4 F (38 C) (Oral)  Ht 5\' 3"  (1.6 m)  Wt 167 lb 12.8 oz (76.114 kg)  BMI 29.73 kg/m2 Physical Exam  Constitutional: She is oriented to person, place, and time. She appears well-developed and well-nourished.  HENT:  Head: Normocephalic and atraumatic.  Mouth/Throat: Oropharynx is clear and moist.  Eyes: Pupils are equal, round, and reactive to light.  Neck: Normal range of motion. Neck supple.  Cardiovascular: Normal rate and regular rhythm.   No murmur heard. Pulmonary/Chest: Effort normal and breath sounds normal.  Abdominal: Soft. Bowel sounds are normal. There is no tenderness.  Neurological: She is alert and oriented to person, place, and time.  Skin: Skin is warm and dry.  Psychiatric: She has a normal mood and affect.          Assessment & Plan:     ICD-9-CM ICD-10-CM   1. Acute bronchitis due to Rhinovirus 466.0 J20.6 azithromycin (ZITHROMAX) 250 MG tablet   079.3  methylPREDNIsolone (MEDROL DOSPACK) 4 MG tablet     HYDROcodone-homatropine (HYCODAN) 5-1.5 MG/5ML syrup     albuterol (VENTOLIN HFA) 108 (90 BASE) MCG/ACT inhaler     montelukast (SINGULAIR) 10 MG tablet     azelastine (ASTELIN) 0.1 % nasal  spray     loratadine (CLARITIN) 10 MG tablet   Push po fluids, rest, tylenol and motrin otc prn as directed for fever, arthralgias, and myalgias.  Follow up prn if sx's continue or persist.  No Follow-up on file.  Lysbeth Penner FNP

## 2014-09-09 ENCOUNTER — Ambulatory Visit: Payer: Medicare Other | Admitting: Family Medicine

## 2014-12-15 ENCOUNTER — Ambulatory Visit: Payer: 59 | Admitting: *Deleted

## 2014-12-21 ENCOUNTER — Other Ambulatory Visit: Payer: Self-pay | Admitting: Family Medicine

## 2014-12-22 ENCOUNTER — Encounter: Payer: Self-pay | Admitting: *Deleted

## 2014-12-22 ENCOUNTER — Ambulatory Visit (INDEPENDENT_AMBULATORY_CARE_PROVIDER_SITE_OTHER): Payer: 59 | Admitting: *Deleted

## 2014-12-22 VITALS — BP 150/56 | HR 65 | Ht 63.0 in | Wt 164.0 lb

## 2014-12-22 DIAGNOSIS — Z0000 Encounter for general adult medical examination without abnormal findings: Secondary | ICD-10-CM

## 2014-12-22 DIAGNOSIS — Z1211 Encounter for screening for malignant neoplasm of colon: Secondary | ICD-10-CM

## 2014-12-22 NOTE — Patient Instructions (Signed)
You have been scheduled for a mammogram on May 25th at 2pm at the [facility] mobile unit here at [place] are scheduled with Dr. Livia Snellen on May 26th at 8:55 am  We are referring you for a colonoscopy.  You will be contacted about scheduling this appointment.  Please call to schedule an eye exam with Dr. Cori Razor  Thank you for coming in for your annual wellness visit today!    Preventive Care for Adults A healthy lifestyle and preventive care can promote health and wellness. Preventive health guidelines for women include the following key practices.  A routine yearly physical is a good way to check with your health care provider about your health and preventive screening. It is a chance to share any concerns and updates on your health and to receive a thorough exam.  Visit your dentist for a routine exam and preventive care every 6 months. Brush your teeth twice a day and floss once a day. Good oral hygiene prevents tooth decay and gum disease.  The frequency of eye exams is based on your age, health, family medical history, use of contact lenses, and other factors. Follow your health care provider's recommendations for frequency of eye exams.  Eat a healthy diet. Foods like vegetables, fruits, whole grains, low-fat dairy products, and lean protein foods contain the nutrients you need without too many calories. Decrease your intake of foods high in solid fats, added sugars, and salt. Eat the right amount of calories for you.Get information about a proper diet from your health care provider, if necessary.  Regular physical exercise is one of the most important things you can do for your health. Most adults should get at least 150 minutes of moderate-intensity exercise (any activity that increases your heart rate and causes you to sweat) each week. In addition, most adults need muscle-strengthening exercises on 2 or more days a week.  Maintain a healthy weight. The body  mass index (BMI) is a screening tool to identify possible weight problems. It provides an estimate of body fat based on height and weight. Your health care provider can find your BMI and can help you achieve or maintain a healthy weight.For adults 20 years and older:  A BMI below 18.5 is considered underweight.  A BMI of 18.5 to 24.9 is normal.  A BMI of 25 to 29.9 is considered overweight.  A BMI of 30 and above is considered obese.  Maintain normal blood lipids and cholesterol levels by exercising and minimizing your intake of saturated fat. Eat a balanced diet with plenty of fruit and vegetables. Blood tests for lipids and cholesterol should begin at age 81 and be repeated every 5 years. If your lipid or cholesterol levels are high, you are over 50, or you are at high risk for heart disease, you may need your cholesterol levels checked more frequently.Ongoing high lipid and cholesterol levels should be treated with medicines if diet and exercise are not working.  If you smoke, find out from your health care provider how to quit. If you do not use tobacco, do not start.  Lung cancer screening is recommended for adults aged 36-80 years who are at high risk for developing lung cancer because of a history of smoking. A yearly low-dose CT scan of the lungs is recommended for people who have at least a 30-pack-year history of smoking and are a current smoker or have quit within the past 15 years. A pack year of smoking is  smoking an average of 1 pack of cigarettes a day for 1 year (for example: 1 pack a day for 30 years or 2 packs a day for 15 years). Yearly screening should continue until the smoker has stopped smoking for at least 15 years. Yearly screening should be stopped for people who develop a health problem that would prevent them from having lung cancer treatment.  If you are pregnant, do not drink alcohol. If you are breastfeeding, be very cautious about drinking alcohol. If you are not  pregnant and choose to drink alcohol, do not have more than 1 drink per day. One drink is considered to be 12 ounces (355 mL) of beer, 5 ounces (148 mL) of wine, or 1.5 ounces (44 mL) of liquor.  Avoid use of street drugs. Do not share needles with anyone. Ask for help if you need support or instructions about stopping the use of drugs.  High blood pressure causes heart disease and increases the risk of stroke. Your blood pressure should be checked at least every 1 to 2 years. Ongoing high blood pressure should be treated with medicines if weight loss and exercise do not work.  If you are 16-67 years old, ask your health care provider if you should take aspirin to prevent strokes.  Diabetes screening involves taking a blood sample to check your fasting blood sugar level. This should be done once every 3 years, after age 58, if you are within normal weight and without risk factors for diabetes. Testing should be considered at a younger age or be carried out more frequently if you are overweight and have at least 1 risk factor for diabetes.  Breast cancer screening is essential preventive care for women. You should practice "breast self-awareness." This means understanding the normal appearance and feel of your breasts and may include breast self-examination. Any changes detected, no matter how small, should be reported to a health care provider. Women in their 67s and 30s should have a clinical breast exam (CBE) by a health care provider as part of a regular health exam every 1 to 3 years. After age 57, women should have a CBE every year. Starting at age 69, women should consider having a mammogram (breast X-ray test) every year. Women who have a family history of breast cancer should talk to their health care provider about genetic screening. Women at a high risk of breast cancer should talk to their health care providers about having an MRI and a mammogram every year.  Breast cancer gene (BRCA)-related  cancer risk assessment is recommended for women who have family members with BRCA-related cancers. BRCA-related cancers include breast, ovarian, tubal, and peritoneal cancers. Having family members with these cancers may be associated with an increased risk for harmful changes (mutations) in the breast cancer genes BRCA1 and BRCA2. Results of the assessment will determine the need for genetic counseling and BRCA1 and BRCA2 testing.  Routine pelvic exams to screen for cancer are no longer recommended for nonpregnant women who are considered low risk for cancer of the pelvic organs (ovaries, uterus, and vagina) and who do not have symptoms. Ask your health care provider if a screening pelvic exam is right for you.  If you have had past treatment for cervical cancer or a condition that could lead to cancer, you need Pap tests and screening for cancer for at least 20 years after your treatment. If Pap tests have been discontinued, your risk factors (such as having a new sexual partner) need  to be reassessed to determine if screening should be resumed. Some women have medical problems that increase the chance of getting cervical cancer. In these cases, your health care provider may recommend more frequent screening and Pap tests.  The HPV test is an additional test that may be used for cervical cancer screening. The HPV test looks for the virus that can cause the cell changes on the cervix. The cells collected during the Pap test can be tested for HPV. The HPV test could be used to screen women aged 32 years and older, and should be used in women of any age who have unclear Pap test results. After the age of 3, women should have HPV testing at the same frequency as a Pap test.  Colorectal cancer can be detected and often prevented. Most routine colorectal cancer screening begins at the age of 29 years and continues through age 58 years. However, your health care provider may recommend screening at an earlier age  if you have risk factors for colon cancer. On a yearly basis, your health care provider may provide home test kits to check for hidden blood in the stool. Use of a small camera at the end of a tube, to directly examine the colon (sigmoidoscopy or colonoscopy), can detect the earliest forms of colorectal cancer. Talk to your health care provider about this at age 62, when routine screening begins. Direct exam of the colon should be repeated every 5-10 years through age 21 years, unless early forms of pre-cancerous polyps or small growths are found.  People who are at an increased risk for hepatitis B should be screened for this virus. You are considered at high risk for hepatitis B if:  You were born in a country where hepatitis B occurs often. Talk with your health care provider about which countries are considered high risk.  Your parents were born in a high-risk country and you have not received a shot to protect against hepatitis B (hepatitis B vaccine).  You have HIV or AIDS.  You use needles to inject street drugs.  You live with, or have sex with, someone who has hepatitis B.  You get hemodialysis treatment.  You take certain medicines for conditions like cancer, organ transplantation, and autoimmune conditions.  Hepatitis C blood testing is recommended for all people born from [birth year] through 1965 and any individual with known risks for hepatitis C.  Practice safe sex. Use condoms and avoid high-risk sexual practices to reduce the spread of sexually transmitted infections (STIs). STIs include gonorrhea, chlamydia, syphilis, trichomonas, herpes, HPV, and human immunodeficiency virus (HIV). Herpes, HIV, and HPV are viral illnesses that have no cure. They can result in disability, cancer, and death.  You should be screened for sexually transmitted illnesses (STIs) including gonorrhea and chlamydia if:  You are sexually active and are younger than 24 years.  You are older than 24 years and  your health care provider tells you that you are at risk for this type of infection.  Your sexual activity has changed since you were last screened and you are at an increased risk for chlamydia or gonorrhea. Ask your health care provider if you are at risk.  If you are at risk of being infected with HIV, it is recommended that you take a prescription medicine daily to prevent HIV infection. This is called preexposure prophylaxis (PrEP). You are considered at risk if:  You are a heterosexual woman, are sexually active, and are at increased risk for HIV  infection.  You take drugs by injection.  You are sexually active with a partner who has HIV.  Talk with your health care provider about whether you are at high risk of being infected with HIV. If you choose to begin PrEP, you should first be tested for HIV. You should then be tested every 3 months for as long as you are taking PrEP.  Osteoporosis is a disease in which the bones lose minerals and strength with aging. This can result in serious bone fractures or breaks. The risk of osteoporosis can be identified using a bone density scan. Women ages 69 years and over and women at risk for fractures or osteoporosis should discuss screening with their health care providers. Ask your health care provider whether you should take a calcium supplement or vitamin D to reduce the rate of osteoporosis.  Menopause can be associated with physical symptoms and risks. Hormone replacement therapy is available to decrease symptoms and risks. You should talk to your health care provider about whether hormone replacement therapy is right for you.  Use sunscreen. Apply sunscreen liberally and repeatedly throughout the day. You should seek shade when your shadow is shorter than you. Protect yourself by wearing long sleeves, pants, a wide-brimmed hat, and sunglasses year round, whenever you are outdoors.  Once a month, do a whole body skin exam, using a mirror to look  at the skin on your back. Tell your health care provider of new moles, moles that have irregular borders, moles that are larger than a pencil eraser, or moles that have changed in shape or color.  Stay current with required vaccines (immunizations).  Influenza vaccine. All adults should be immunized every year.  Tetanus, diphtheria, and acellular pertussis (Td, Tdap) vaccine. Pregnant women should receive 1 dose of Tdap vaccine during each pregnancy. The dose should be obtained regardless of the length of time since the last dose. Immunization is preferred during the 27th-36th week of gestation. An adult who has not previously received Tdap or who does not know her vaccine status should receive 1 dose of Tdap. This initial dose should be followed by tetanus and diphtheria toxoids (Td) booster doses every 10 years. Adults with an unknown or incomplete history of completing a 3-dose immunization series with Td-containing vaccines should begin or complete a primary immunization series including a Tdap dose. Adults should receive a Td booster every 10 years.  Varicella vaccine. An adult without evidence of immunity to varicella should receive 2 doses or a second dose if she has previously received 1 dose. Pregnant females who do not have evidence of immunity should receive the first dose after pregnancy. This first dose should be obtained before leaving the health care facility. The second dose should be obtained 4-8 weeks after the first dose.  Human papillomavirus (HPV) vaccine. Females aged 13-26 years who have not received the vaccine previously should obtain the 3-dose series. The vaccine is not recommended for use in pregnant females. However, pregnancy testing is not needed before receiving a dose. If a female is found to be pregnant after receiving a dose, no treatment is needed. In that case, the remaining doses should be delayed until after the pregnancy. Immunization is recommended for any person  with an immunocompromised condition through the age of 56 years if she did not get any or all doses earlier. During the 3-dose series, the second dose should be obtained 4-8 weeks after the first dose. The third dose should be obtained 24 weeks after  the first dose and 16 weeks after the second dose.  Zoster vaccine. One dose is recommended for adults aged 55 years or older unless certain conditions are present.  Measles, mumps, and rubella (MMR) vaccine. Adults born before [birth year] generally are considered immune to measles and mumps. Adults born in [birth year] or later should have 1 or more doses of MMR vaccine unless there is a contraindication to the vaccine or there is laboratory evidence of immunity to each of the three diseases. A routine second dose of MMR vaccine should be obtained at least 28 days after the first dose for students attending postsecondary schools, health care workers, or international travelers. People who received inactivated measles vaccine or an unknown type of measles vaccine during 1963-1967 should receive 2 doses of MMR vaccine. People who received inactivated mumps vaccine or an unknown type of mumps vaccine before 1979 and are at high risk for mumps infection should consider immunization with 2 doses of MMR vaccine. For females of childbearing age, rubella immunity should be determined. If there is no evidence of immunity, females who are not pregnant should be vaccinated. If there is no evidence of immunity, females who are pregnant should delay immunization until after pregnancy. Unvaccinated health care workers born before [birth year] who lack laboratory evidence of measles, mumps, or rubella immunity or laboratory confirmation of disease should consider measles and mumps immunization with 2 doses of MMR vaccine or rubella immunization with 1 dose of MMR vaccine.  Pneumococcal 13-valent conjugate (PCV13) vaccine. When indicated, a person who is uncertain of her immunization history and has  no record of immunization should receive the PCV13 vaccine. An adult aged 58 years or older who has certain medical conditions and has not been previously immunized should receive 1 dose of PCV13 vaccine. This PCV13 should be followed with a dose of pneumococcal polysaccharide (PPSV23) vaccine. The PPSV23 vaccine dose should be obtained at least 8 weeks after the dose of PCV13 vaccine. An adult aged 34 years or older who has certain medical conditions and previously received 1 or more doses of PPSV23 vaccine should receive 1 dose of PCV13. The PCV13 vaccine dose should be obtained 1 or more years after the last PPSV23 vaccine dose.  Pneumococcal polysaccharide (PPSV23) vaccine. When PCV13 is also indicated, PCV13 should be obtained first. All adults aged 61 years and older should be immunized. An adult younger than age 9 years who has certain medical conditions should be immunized. Any person who resides in a nursing home or long-term care facility should be immunized. An adult smoker should be immunized. People with an immunocompromised condition and certain other conditions should receive both PCV13 and PPSV23 vaccines. People with human immunodeficiency virus (HIV) infection should be immunized as soon as possible after diagnosis. Immunization during chemotherapy or radiation therapy should be avoided. Routine use of PPSV23 vaccine is not recommended for American Indians, [place] Natives, or people younger than 65 years unless there are medical conditions that require PPSV23 vaccine. When indicated, people who have unknown immunization and have no record of immunization should receive PPSV23 vaccine. One-time revaccination 5 years after the first dose of PPSV23 is recommended for people aged 19-64 years who have chronic kidney failure, nephrotic syndrome, asplenia, or immunocompromised conditions. People who received 1-2 doses of PPSV23 before age 59 years should receive another dose of PPSV23 vaccine at age 65  years or later if at least 5 years have passed since the previous dose. Doses of PPSV23 are not needed for people  immunized with PPSV23 at or after age 75 years.  Meningococcal vaccine. Adults with asplenia or persistent complement component deficiencies should receive 2 doses of quadrivalent meningococcal conjugate (MenACWY-D) vaccine. The doses should be obtained at least 2 months apart. Microbiologists working with certain meningococcal bacteria, [employer], people at risk during an outbreak, and people who travel to or live in countries with a high rate of meningitis should be immunized. A first-year college student up through age 33 years who is living in a residence hall should receive a dose if she did not receive a dose on or after her 16th birthday. Adults who have certain high-risk conditions should receive one or more doses of vaccine.  Hepatitis A vaccine. Adults who wish to be protected from this disease, have certain high-risk conditions, work with hepatitis A-infected animals, work in hepatitis A research labs, or travel to or work in countries with a high rate of hepatitis A should be immunized. Adults who were previously unvaccinated and who anticipate close contact with an international adoptee during the first 60 days after arrival in the [place] from a country with a high rate of hepatitis A should be immunized.  Hepatitis B vaccine. Adults who wish to be protected from this disease, have certain high-risk conditions, may be exposed to blood or other infectious body fluids, are household contacts or sex partners of hepatitis B positive people, are clients or workers in certain care facilities, or travel to or work in countries with a high rate of hepatitis B should be immunized.  Haemophilus influenzae type b (Hib) vaccine. A previously unvaccinated person with asplenia or sickle cell disease or having a scheduled splenectomy should receive 1 dose of Hib vaccine. Regardless  of previous immunization, a recipient of a hematopoietic stem cell transplant should receive a 3-dose series 6-12 months after her successful transplant. Hib vaccine is not recommended for adults with HIV infection. Preventive Services / Frequency Ages 72 to 41 years  Blood pressure check.** / Every 1 to 2 years.  Lipid and cholesterol check.** / Every 5 years beginning at age 55.  Clinical breast exam.** / Every 3 years for women in their 15s and 72s.  BRCA-related cancer risk assessment.** / For women who have family members with a BRCA-related cancer (breast, ovarian, tubal, or peritoneal cancers).  Pap test.** / Every 2 years from ages 72 through 33. Every 3 years starting at age 10 through age 73 or 109 with a history of 3 consecutive normal Pap tests.  HPV screening.** / Every 3 years from ages 31 through ages 45 to 97 with a history of 3 consecutive normal Pap tests.  Hepatitis C blood test.** / For any individual with known risks for hepatitis C.  Skin self-exam. / Monthly.  Influenza vaccine. / Every year.  Tetanus, diphtheria, and acellular pertussis (Tdap, Td) vaccine.** / Consult your health care provider. Pregnant women should receive 1 dose of Tdap vaccine during each pregnancy. 1 dose of Td every 10 years.  Varicella vaccine.** / Consult your health care provider. Pregnant females who do not have evidence of immunity should receive the first dose after pregnancy.  HPV vaccine. / 3 doses over 6 months, if 43 and younger. The vaccine is not recommended for use in pregnant females. However, pregnancy testing is not needed before receiving a dose.  Measles, mumps, rubella (MMR) vaccine.** / You need at least 1 dose of MMR if you were born in 1957 or later. You may also need a  2nd dose. For females of childbearing age, rubella immunity should be determined. If there is no evidence of immunity, females who are not pregnant should be vaccinated. If there is no evidence of immunity,  females who are pregnant should delay immunization until after pregnancy.  Pneumococcal 13-valent conjugate (PCV13) vaccine.** / Consult your health care provider.  Pneumococcal polysaccharide (PPSV23) vaccine.** / 1 to 2 doses if you smoke cigarettes or if you have certain conditions.  Meningococcal vaccine.** / 1 dose if you are age 29 to 45 years and a Market researcher living in a residence hall, or have one of several medical conditions, you need to get vaccinated against meningococcal disease. You may also need additional booster doses.  Hepatitis A vaccine.** / Consult your health care provider.  Hepatitis B vaccine.** / Consult your health care provider.  Haemophilus influenzae type b (Hib) vaccine.** / Consult your health care provider. Ages 61 to 1 years  Blood pressure check.** / Every 1 to 2 years.  Lipid and cholesterol check.** / Every 5 years beginning at age 27 years.  Lung cancer screening. / Every year if you are aged 53-80 years and have a 30-pack-year history of smoking and currently smoke or have quit within the past 15 years. Yearly screening is stopped once you have quit smoking for at least 15 years or develop a health problem that would prevent you from having lung cancer treatment.  Clinical breast exam.** / Every year after age 14 years.  BRCA-related cancer risk assessment.** / For women who have family members with a BRCA-related cancer (breast, ovarian, tubal, or peritoneal cancers).  Mammogram.** / Every year beginning at age 12 years and continuing for as long as you are in good health. Consult with your health care provider.  Pap test.** / Every 3 years starting at age 25 years through age 88 or 107 years with a history of 3 consecutive normal Pap tests.  HPV screening.** / Every 3 years from ages 11 years through ages 22 to 63 years with a history of 3 consecutive normal Pap tests.  Fecal occult blood test (FOBT) of stool. / Every year  beginning at age 28 years and continuing until age 63 years. You may not need to do this test if you get a colonoscopy every 10 years.  Flexible sigmoidoscopy or colonoscopy.** / Every 5 years for a flexible sigmoidoscopy or every 10 years for a colonoscopy beginning at age 26 years and continuing until age 65 years.  Hepatitis C blood test.** / For all people born from [birth year] through 1965 and any individual with known risks for hepatitis C.  Skin self-exam. / Monthly.  Influenza vaccine. / Every year.  Tetanus, diphtheria, and acellular pertussis (Tdap/Td) vaccine.** / Consult your health care provider. Pregnant women should receive 1 dose of Tdap vaccine during each pregnancy. 1 dose of Td every 10 years.  Varicella vaccine.** / Consult your health care provider. Pregnant females who do not have evidence of immunity should receive the first dose after pregnancy.  Zoster vaccine.** / 1 dose for adults aged 31 years or older.  Measles, mumps, rubella (MMR) vaccine.** / You need at least 1 dose of MMR if you were born in 1957 or later. You may also need a 2nd dose. For females of childbearing age, rubella immunity should be determined. If there is no evidence of immunity, females who are not pregnant should be vaccinated. If there is no evidence of immunity, females who are pregnant should delay  immunization until after pregnancy.  Pneumococcal 13-valent conjugate (PCV13) vaccine.** / Consult your health care provider.  Pneumococcal polysaccharide (PPSV23) vaccine.** / 1 to 2 doses if you smoke cigarettes or if you have certain conditions.  Meningococcal vaccine.** / Consult your health care provider.  Hepatitis A vaccine.** / Consult your health care provider.  Hepatitis B vaccine.** / Consult your health care provider.  Haemophilus influenzae type b (Hib) vaccine.** / Consult your health care provider. Ages 65 years and over  Blood pressure check.** / Every 1 to 2 years.  Lipid and  cholesterol check.** / Every 5 years beginning at age 20 years.  Lung cancer screening. / Every year if you are aged 48-80 years and have a 30-pack-year history of smoking and currently smoke or have quit within the past 15 years. Yearly screening is stopped once you have quit smoking for at least 15 years or develop a health problem that would prevent you from having lung cancer treatment.  Clinical breast exam.** / Every year after age 21 years.  BRCA-related cancer risk assessment.** / For women who have family members with a BRCA-related cancer (breast, ovarian, tubal, or peritoneal cancers).  Mammogram.** / Every year beginning at age 76 years and continuing for as long as you are in good health. Consult with your health care provider.  Pap test.** / Every 3 years starting at age 67 years through age 39 or 7 years with 3 consecutive normal Pap tests. Testing can be stopped between 65 and 70 years with 3 consecutive normal Pap tests and no abnormal Pap or HPV tests in the past 10 years.  HPV screening.** / Every 3 years from ages 59 years through ages 63 or 45 years with a history of 3 consecutive normal Pap tests. Testing can be stopped between 65 and 70 years with 3 consecutive normal Pap tests and no abnormal Pap or HPV tests in the past 10 years.  Fecal occult blood test (FOBT) of stool. / Every year beginning at age 72 years and continuing until age 50 years. You may not need to do this test if you get a colonoscopy every 10 years.  Flexible sigmoidoscopy or colonoscopy.** / Every 5 years for a flexible sigmoidoscopy or every 10 years for a colonoscopy beginning at age 48 years and continuing until age 70 years.  Hepatitis C blood test.** / For all people born from [birth year] through 1965 and any individual with known risks for hepatitis C.  Osteoporosis screening.** / A one-time screening for women ages 79 years and over and women at risk for fractures or osteoporosis.  Skin self-exam. /  Monthly.  Influenza vaccine. / Every year.  Tetanus, diphtheria, and acellular pertussis (Tdap/Td) vaccine.** / 1 dose of Td every 10 years.  Varicella vaccine.** / Consult your health care provider.  Zoster vaccine.** / 1 dose for adults aged 108 years or older.  Pneumococcal 13-valent conjugate (PCV13) vaccine.** / Consult your health care provider.  Pneumococcal polysaccharide (PPSV23) vaccine.** / 1 dose for all adults aged 76 years and older.  Meningococcal vaccine.** / Consult your health care provider.  Hepatitis A vaccine.** / Consult your health care provider.  Hepatitis B vaccine.** / Consult your health care provider.  Haemophilus influenzae type b (Hib) vaccine.** / Consult your health care provider. ** Family history and personal history of risk and conditions may change your health care provider's recommendations. Document Released: 10/01/2001 Document Revised: 12/20/2013 Document Reviewed: 12/31/2010 [facility] Patient Information 2015 [place], [place]. This information is  not intended to replace advice given to you by your health care provider. Make sure you discuss any questions you have with your health care provider.   Fall Prevention and Home Safety Falls cause injuries and can affect all age groups. It is possible to use preventive measures to significantly decrease the likelihood of falls. There are many simple measures which can make your home safer and prevent falls. OUTDOORS  Repair cracks and edges of walkways and driveways.  Remove high doorway thresholds.  Trim shrubbery on the main path into your home.  Have good outside lighting.  Clear walkways of tools, rocks, debris, and clutter.  Check that handrails are not broken and are securely fastened. Both sides of steps should have handrails.  Have leaves, snow, and ice cleared regularly.  Use sand or salt on walkways during winter months.  In the garage, clean up grease or oil  spills. BATHROOM  Install night lights.  Install grab bars by the toilet and in the tub and shower.  Use non-skid mats or decals in the tub or shower.  Place a plastic non-slip stool in the shower to sit on, if needed.  Keep floors dry and clean up all water on the floor immediately.  Remove soap buildup in the tub or shower on a regular basis.  Secure bath mats with non-slip, double-sided rug tape.  Remove throw rugs and tripping hazards from the floors. BEDROOMS  Install night lights.  Make sure a bedside light is easy to reach.  Do not use oversized bedding.  Keep a telephone by your bedside.  Have a firm chair with side arms to use for getting dressed.  Remove throw rugs and tripping hazards from the floor. KITCHEN  Keep handles on pots and pans turned toward the center of the stove. Use back burners when possible.  Clean up spills quickly and allow time for drying.  Avoid walking on wet floors.  Avoid hot utensils and knives.  Position shelves so they are not too high or low.  Place commonly used objects within easy reach.  If necessary, use a sturdy step stool with a grab bar when reaching.  Keep electrical cables out of the way.  Do not use floor polish or wax that makes floors slippery. If you must use wax, use non-skid floor wax.  Remove throw rugs and tripping hazards from the floor. STAIRWAYS  Never leave objects on stairs.  Place handrails on both sides of stairways and use them. Fix any loose handrails. Make sure handrails on both sides of the stairways are as long as the stairs.  Check carpeting to make sure it is firmly attached along stairs. Make repairs to worn or loose carpet promptly.  Avoid placing throw rugs at the top or bottom of stairways, or properly secure the rug with carpet tape to prevent slippage. Get rid of throw rugs, if possible.  Have an electrician put in a light switch at the top and bottom of the stairs. OTHER FALL  PREVENTION TIPS  Wear low-heel or rubber-soled shoes that are supportive and fit well. Wear closed toe shoes.  When using a stepladder, make sure it is fully opened and both spreaders are firmly locked. Do not climb a closed stepladder.  Add color or contrast paint or tape to grab bars and handrails in your home. Place contrasting color strips on first and last steps.  Learn and use mobility aids as needed. Install an electrical emergency response system.  Turn on lights  to avoid dark areas. Replace light bulbs that burn out immediately. Get light switches that glow.  Arrange furniture to create clear pathways. Keep furniture in the same place.  Firmly attach carpet with non-skid or double-sided tape.  Eliminate uneven floor surfaces.  Select a carpet pattern that does not visually hide the edge of steps.  Be aware of all pets. OTHER HOME SAFETY TIPS  Set the water temperature for 120 F (48.8 C).  Keep emergency numbers on or near the telephone.  Keep smoke detectors on every level of the home and near sleeping areas. Document Released: 07/26/2002 Document Revised: 02/04/2012 Document Reviewed: 10/25/2011 [facility] Patient Information 2015 [place], [place]. This information is not intended to replace advice given to you by your health care provider. Make sure you discuss any questions you have with your health care provider.

## 2014-12-22 NOTE — Progress Notes (Signed)
Subjective:   Kristi Willis is a 74 y.o. female who presents for an Initial Medicare Annual Wellness Visit.  She is doing well today, although she does have some mild left knee and right shoulder pain which she attributes to arthritis. Patient lives at home with her husband.  They have 1 daughter, 2 grandchildren, and 1 great grandchild.  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  She enjoys reading and walking for exercise.        Cardiac Risk Factors include: advanced age (>61men, >87 women);diabetes mellitus;dyslipidemia;hypertension     Objective:    Today's Vitals   12/22/14 1002  BP: 150/56  Pulse: 65  Height: 5\' 3"  (1.6 m)  Weight: 164 lb (74.39 kg)  PainSc: 2     Current Medications (verified) Outpatient Encounter Prescriptions as of 12/22/2014  Medication Sig  . albuterol (VENTOLIN HFA) 108 (90 BASE) MCG/ACT inhaler Inhale 2 puffs into the lungs every 6 (six) hours as needed for wheezing.  Marland Kitchen amLODipine (NORVASC) 5 MG tablet Take 1 tablet (5 mg total) by mouth daily.  Marland Kitchen azelastine (ASTELIN) 0.1 % nasal spray Place 2 sprays into both nostrils 2 (two) times daily.  . Calcium Carbonate-Vitamin D (CALTRATE 600+D) 600-400 MG-UNIT per tablet Take 1 tablet by mouth 2 (two) times daily.  . Cholecalciferol (VITAMIN D3) 1000 UNITS CAPS Take by mouth daily.    . citalopram (CELEXA) 20 MG tablet Take 1 tablet (20 mg total) by mouth daily. (Patient taking differently: Take 10 mg by mouth daily. )  . EPIPEN 2-PAK 0.3 MG/0.3ML SOAJ injection   . glucose blood test strip 1 each by Other route 3 (three) times daily.  Marland Kitchen lisinopril (PRINIVIL,ZESTRIL) 40 MG tablet Take 1 tablet (40 mg total) by mouth daily. TAKE 1 TABLET DAILY  . loratadine (CLARITIN) 10 MG tablet Take 1 tablet (10 mg total) by mouth daily.  . metFORMIN (GLUCOPHAGE) 1000 MG tablet Take 1 tablet (1,000 mg total) by mouth 2 (two) times daily with a meal.  .  metoprolol succinate (TOPROL-XL) 100 MG 24 hr tablet Take 1 tablet (100 mg total) by mouth 2 (two) times daily. Take with or immediately following a meal.  . montelukast (SINGULAIR) 10 MG tablet Take 1 tablet (10 mg total) by mouth at bedtime.  . Multiple Vitamin (MULTIVITAMIN) tablet Take 1 tablet by mouth daily.    . Pyridoxine HCl (VITAMIN B-6 PO) Take 1 tablet by mouth 2 (two) times daily.  . vitamin B-12 (CYANOCOBALAMIN) 500 MCG tablet Take 500 mcg by mouth daily.    Marland Kitchen HYDROcodone-homatropine (HYCODAN) 5-1.5 MG/5ML syrup Take 5 mLs by mouth every 8 (eight) hours as needed for cough. (Patient not taking: Reported on 12/22/2014)  . terazosin (HYTRIN) 2 MG capsule TAKE 1 CAPSULE AT BEDTIME (Patient not taking: Reported on 12/22/2014)  . [DISCONTINUED] amLODipine (NORVASC) 5 MG tablet Take 1 tablet (5 mg total) by mouth daily.  . [DISCONTINUED] azithromycin (ZITHROMAX) 250 MG tablet Take 2 po first day and then one po qd x 4 days (Patient not taking: Reported on 12/22/2014)  . [DISCONTINUED] methylPREDNIsolone (MEDROL DOSPACK) 4 MG tablet follow package directions (Patient not taking: Reported on 12/22/2014)   No facility-administered encounter medications on file as of 12/22/2014.    Allergies (verified) Dimetapp c; Phenylephrine hcl; Ultram; Celebrex; Cortisone; Fenofibrate; and Sulfa antibiotics   History: Past Medical History  Diagnosis Date  . SVT (supraventricular tachycardia)   .  Hypertension   . Diabetes mellitus   . Hypercholesteremia   . Neuropathy   . Asthma   . GERD (gastroesophageal reflux disease)     hiatal hernia  . Cancer     skin  . Neuromuscular disorder     DM neuropathy  . Allergy    Past Surgical History  Procedure Laterality Date  . Lumbar laminectomy  08/10  . Spine surgery      lumbar laminectomy  . Breast biopsy      left breast biopsy  . Skin cancer excision  2005   Family History  Problem Relation Age of Onset  . Cancer Mother 73    cervical cancer  .  Depression Mother   . Heart disease Father   . Stroke Father   . Cancer Father     lung cancer  . Heart disease Sister 34    MI  . Hypertension Sister   . Cancer Sister 23    breast cancer   Social History   Occupational History  . Retired from [employer] but still works part-time    Social History Main Topics  . Smoking status: Never Smoker   . Smokeless tobacco: Never Used  . Alcohol Use: No  . Drug Use: No  . Sexual Activity: No    Tobacco Counseling Counseling given: Not Answered   Activities of Daily Living In your present state of health, do you have any difficulty performing the following activities: 12/22/2014  Hearing? N  Vision? N  Difficulty concentrating or making decisions? N  Walking or climbing stairs? (No Data)  Dressing or bathing? N  Doing errands, shopping? N  Preparing Food and eating ? N  Using the Toilet? N  In the past six months, have you accidently leaked urine? N  Do you have problems with loss of bowel control? N  Managing your Medications? N  Managing your Finances? N  Housekeeping or managing your Housekeeping? N    Immunizations and Health Maintenance Immunization History  Administered Date(s) Administered  . Influenza,inj,Quad PF,36+ Mos 05/20/2013, 05/31/2014  . Influenza-Unspecified 08/20/2011  . Pneumococcal Conjugate-13 05/31/2014  . Pneumococcal Polysaccharide-23 07/22/2012   Health Maintenance Due  Topic Date Due  . ZOSTAVAX  07/23/2001  . MAMMOGRAM  03/19/2010  . COLONOSCOPY  08/19/2012  . OPHTHALMOLOGY EXAM  05/19/2013  . FOOT EXAM  03/02/2014  . HEMOGLOBIN A1C  11/30/2014    Patient Care Team: Wardell Honour, MD as PCP - General (Family Medicine)       Assessment:   This is a routine wellness examination for [place].   Hearing/Vision screen Patient is due for vision screening and states she is planning to call Dr. Demetrius Charity office to get an appointment for herself and her husband.  No history of glaucoma  or cataracts. No hearing deficit noted.   Dietary issues and exercise activities discussed: Current Exercise Habits:: Home exercise routine, Type of exercise: walking, Time (Minutes): 30, Frequency (Times/Week): 4, Weekly Exercise (Minutes/Week): 120, Intensity: Moderate  Goals    None    Continue walking at least 3 times per week for about 30 minutes per session Include mostly lean meats, vegetables, and fruit in diet  Depression Screen PHQ 2/9 Scores 12/22/2014 10/11/2013  PHQ - 2 Score 1 0    Fall Risk Fall Risk  12/22/2014 10/11/2013  Falls in the past year? No No    Cognitive Function: MMSE - Mini Mental State Exam 12/22/2014  Orientation to time 5  Orientation  to Place 5  Registration 3  Attention/ Calculation 5  Recall 3  Language- name 2 objects 2  Language- repeat 0  Language- follow 3 step command 3  Language- read & follow direction 1  Write a sentence 1  Copy design 1  Total score 29    Screening Tests Health Maintenance  Topic Date Due  . ZOSTAVAX  07/23/2001  . MAMMOGRAM  03/19/2010  . COLONOSCOPY  08/19/2012  . OPHTHALMOLOGY EXAM  05/19/2013  . FOOT EXAM  03/02/2014  . HEMOGLOBIN A1C  11/30/2014  . INFLUENZA VACCINE  03/20/2015  . URINE MICROALBUMIN  06/01/2015  . TETANUS/TDAP  11/28/2022  . DEXA SCAN  Completed  . PNA vac Low Risk Adult  Completed      Plan:     Continue walking at least 3 times per week for about 30 minutes per session Include mostly lean meats, vegetables, and fruit in diet  During the course of the visit, Kristi Willis was educated and counseled about the following appropriate screening and preventive services:   Vaccines to include Pneumoccal, Influenza, Td, Zostavax- up to date on all but zostavax which she declined today  Electrocardiogram- recommended at upcoming visit with Dr. Livia Snellen- last on file was 02/2013  Cardiovascular disease screening- lipids screened 05/31/14  Colorectal cancer screening- referred today to GI for  colonoscopy   Bone density screening- up to date  Diabetes screening-  Up to date  Glaucoma screening-patient states she is planning to call Dr. Demetrius Charity office to schedule an appointment for herself and her husband  Mammography/PAP- scheduled for mammogram on 01/11/15, recommend discussing Pap with Dr. Livia Snellen at visit on 01/12/15  Nutrition counseling- discussed today   Patient Instructions (the written plan) were given to the patient.    Ruthy Forry M, RN   12/22/2014      I have reviewed and agree with the above AWV documentation.  Claretta Fraise, M.D.

## 2015-01-10 ENCOUNTER — Other Ambulatory Visit: Payer: Self-pay | Admitting: Family Medicine

## 2015-01-12 ENCOUNTER — Ambulatory Visit (INDEPENDENT_AMBULATORY_CARE_PROVIDER_SITE_OTHER): Payer: 59 | Admitting: Family Medicine

## 2015-01-12 DIAGNOSIS — E785 Hyperlipidemia, unspecified: Secondary | ICD-10-CM

## 2015-01-12 DIAGNOSIS — E559 Vitamin D deficiency, unspecified: Secondary | ICD-10-CM | POA: Diagnosis not present

## 2015-01-12 DIAGNOSIS — I10 Essential (primary) hypertension: Secondary | ICD-10-CM | POA: Diagnosis not present

## 2015-01-12 DIAGNOSIS — R5383 Other fatigue: Secondary | ICD-10-CM | POA: Diagnosis not present

## 2015-01-12 DIAGNOSIS — E1342 Other specified diabetes mellitus with diabetic polyneuropathy: Secondary | ICD-10-CM

## 2015-01-12 LAB — POCT CBC
GRANULOCYTE PERCENT: 56.5 % (ref 37–80)
HCT, POC: 40.2 % (ref 37.7–47.9)
Hemoglobin: 12.9 g/dL (ref 12.2–16.2)
LYMPH, POC: 2.8 (ref 0.6–3.4)
MCH: 28.1 pg (ref 27–31.2)
MCHC: 32.1 g/dL (ref 31.8–35.4)
MCV: 87.6 fL (ref 80–97)
MPV: 8.3 fL (ref 0–99.8)
PLATELET COUNT, POC: 249 10*3/uL (ref 142–424)
POC Granulocyte: 4.2 (ref 2–6.9)
POC LYMPH PERCENT: 36.8 %L (ref 10–50)
RBC: 4.59 M/uL (ref 4.04–5.48)
RDW, POC: 13.2 %
WBC: 7.5 10*3/uL (ref 4.6–10.2)

## 2015-01-12 LAB — POCT GLYCOSYLATED HEMOGLOBIN (HGB A1C): HEMOGLOBIN A1C: 7.5

## 2015-01-12 MED ORDER — BLOOD GLUCOSE MONITOR KIT: PACK | Status: DC

## 2015-01-12 MED ORDER — METOPROLOL TARTRATE 100 MG PO TABS: 100.0000 mg | ORAL_TABLET | Freq: Two times a day (BID) | ORAL | Status: DC

## 2015-01-12 NOTE — Patient Instructions (Signed)
Call Dr. Irving Shows today regarding left big toe lesion.

## 2015-01-12 NOTE — Progress Notes (Signed)
Subjective:  Patient ID: Kristi Willis, female    DOB: [birth year]  Age: 74 y.o. MRN: [id number]  CC: Diabetes; Hypertension; and Hyperlipidemia   HPI Kristi Willis presents for  follow-up of hypertension. Patient has no history of headache chest pain or shortness of breath or recent cough. Patient also denies symptoms of TIA such as numbness weakness lateralizing. Patient checks  blood pressure at home and has not had any elevated readings recently. Patient denies side effects from his medication. States taking it regularly.  Patient also  in for follow-up of elevated cholesterol. Doing well without complaints on current medication. Denies side effects of statin including myalgia and arthralgia and nausea. Also in today for liver function testing. Currently no chest pain, shortness of breath or other cardiovascular related symptoms noted.  Follow-up of diabetes. Patient does not check blood sugar at home. Watching diet.  Patient denies symptoms such as polyuria, polydipsia, excessive hunger, nausea. Lots of foot pain at night. Tingling during the day. Dx as neuropathy. Occasional hypoglycemic spells noted. Medications as noted below. Taking them regularly without complication/adverse reaction being reported today.    History Kristi Willis has a past medical history of SVT (supraventricular tachycardia); Hypertension; Diabetes mellitus; Hypercholesteremia; Neuropathy; Asthma; GERD (gastroesophageal reflux disease); Cancer; Neuromuscular disorder; and Allergy.   She has past surgical history that includes Lumbar laminectomy (08/10); Spine surgery; Breast biopsy; and Skin cancer excision (2005).   Her family history includes Cancer in her father; Cancer (age of onset: 66) in her mother; Cancer (age of onset: 73) in her sister; Depression in her mother; Heart disease in her father; Heart disease (age of onset: 4) in her sister; Hypertension in her sister; Stroke in her father.She reports  that she has never smoked. She has never used smokeless tobacco. She reports that she does not drink alcohol or use illicit drugs.  Current Outpatient Prescriptions on File Prior to Visit  Medication Sig Dispense Refill  . albuterol (VENTOLIN HFA) 108 (90 BASE) MCG/ACT inhaler Inhale 2 puffs into the lungs every 6 (six) hours as needed for wheezing. 1 Inhaler 3  . amLODipine (NORVASC) 5 MG tablet Take 1 tablet (5 mg total) by mouth daily. 30 tablet 2  . azelastine (ASTELIN) 0.1 % nasal spray Place 2 sprays into both nostrils 2 (two) times daily. 30 mL 11  . Calcium Carbonate-Vitamin D (CALTRATE 600+D) 600-400 MG-UNIT per tablet Take 1 tablet by mouth 2 (two) times daily.    . Cholecalciferol (VITAMIN D3) 1000 UNITS CAPS Take by mouth daily.      . citalopram (CELEXA) 20 MG tablet Take 1 tablet (20 mg total) by mouth daily. (Patient taking differently: Take 10 mg by mouth daily. ) 30 tablet 5  . EPIPEN 2-PAK 0.3 MG/0.3ML SOAJ injection     . glucose blood test strip 1 each by Other route 3 (three) times daily. 100 each 2  . lisinopril (PRINIVIL,ZESTRIL) 40 MG tablet Take 1 tablet (40 mg total) by mouth daily. TAKE 1 TABLET DAILY 30 tablet 5  . loratadine (CLARITIN) 10 MG tablet Take 1 tablet (10 mg total) by mouth daily. 30 tablet 11  . metFORMIN (GLUCOPHAGE) 1000 MG tablet Take 1 tablet (1,000 mg total) by mouth 2 (two) times daily with a meal. 60 tablet 5  . montelukast (SINGULAIR) 10 MG tablet Take 1 tablet (10 mg total) by mouth at bedtime. 30 tablet 5  . Multiple Vitamin (MULTIVITAMIN) tablet Take 1 tablet by mouth daily.      Marland Kitchen  Pyridoxine HCl (VITAMIN B-6 PO) Take 1 tablet by mouth 2 (two) times daily.    Marland Kitchen terazosin (HYTRIN) 2 MG capsule TAKE 1 CAPSULE AT BEDTIME 30 capsule 3  . vitamin B-12 (CYANOCOBALAMIN) 500 MCG tablet Take 500 mcg by mouth daily.      Marland Kitchen HYDROcodone-homatropine (HYCODAN) 5-1.5 MG/5ML syrup Take 5 mLs by mouth every 8 (eight) hours as needed for cough. (Patient not  taking: Reported on 01/12/2015) 120 mL 0   No current facility-administered medications on file prior to visit.    ROS Review of Systems  Constitutional: Negative for fever, chills, diaphoresis, appetite change, fatigue and unexpected weight change.  HENT: Negative for congestion, ear pain, hearing loss, postnasal drip, rhinorrhea, sneezing, sore throat and trouble swallowing.   Eyes: Negative for pain.  Respiratory: Negative for cough, chest tightness and shortness of breath.   Cardiovascular: Negative for chest pain and palpitations.  Gastrointestinal: Negative for nausea, vomiting, abdominal pain, diarrhea and constipation.  Genitourinary: Negative for dysuria, frequency and menstrual problem.  Musculoskeletal: Negative for joint swelling and arthralgias.  Skin: Negative for rash.  Neurological: Negative for dizziness, weakness, numbness and headaches.  Psychiatric/Behavioral: Negative for dysphoric mood and agitation.    Objective:  There were no vitals taken for this visit.  BP Readings from Last 3 Encounters:  12/22/14 150/56  08/24/14 159/60  05/31/14 149/50    Wt Readings from Last 3 Encounters:  12/22/14 164 lb (74.39 kg)  08/24/14 167 lb 12.8 oz (76.114 kg)  05/31/14 166 lb (75.297 kg)     Physical Exam  Constitutional: She is oriented to person, place, and time. She appears well-developed and well-nourished. No distress.  HENT:  Head: Normocephalic and atraumatic.  Right Ear: External ear normal.  Left Ear: External ear normal.  Nose: Nose normal.  Mouth/Throat: Oropharynx is clear and moist.  Eyes: Conjunctivae and EOM are normal. Pupils are equal, round, and reactive to light.  Neck: Normal range of motion. Neck supple. No thyromegaly present.  Cardiovascular: Normal rate, regular rhythm and normal heart sounds.   No murmur heard. Pulmonary/Chest: Effort normal and breath sounds normal. No respiratory distress. She has no wheezes. She has no rales.    Abdominal: Soft. Bowel sounds are normal. She exhibits no distension. There is no tenderness.  Lymphadenopathy:    She has no cervical adenopathy.  Neurological: She is alert and oriented to person, place, and time. She has normal reflexes.  Skin: Skin is warm and dry.  The left big toe has a 4 x 6 mm blister with minimal surrounding hematoma  Psychiatric: She has a normal mood and affect. Her behavior is normal. Judgment and thought content normal.    Lab Results  Component Value Date   HGBA1C 7.5 01/12/2015   HGBA1C 6.8 05/31/2014   HGBA1C 7.0 10/06/2013    Lab Results  Component Value Date   WBC 7.5 01/12/2015   HGB 12.9 01/12/2015   HCT 40.2 01/12/2015   PLT 257 06/11/2012   GLUCOSE 109* 05/31/2014   CHOL 208* 05/31/2014   TRIG 330* 05/31/2014   HDL 45 05/31/2014   LDLCALC 97 05/31/2014   ALT 21 05/31/2014   AST 29 05/31/2014   NA 140 05/31/2014   K 4.0 05/31/2014   CL 96* 05/31/2014   CREATININE 0.61 05/31/2014   BUN 10 05/31/2014   CO2 23 05/31/2014   INR 1.0 04/11/2009   HGBA1C 7.5 01/12/2015   MICROALBUR 1.45 11/25/2012    US Carotid Duplex Bilateral  03/05/2013   *RADIOLOGY REPORT*  Clinical Data: Right carotid bruit, history hypertension, diabetes, hypercholesterolemia, supraventricular tachycardia  BILATERAL CAROTID DUPLEX ULTRASOUND  Technique: Gray scale imaging, color Doppler and duplex ultrasound were performed of bilateral carotid and vertebral arteries in the neck.  Comparison:  None.  Criteria:  Quantification of carotid stenosis is based on velocity parameters that correlate the residual internal carotid diameter with NASCET-based stenosis levels, using the diameter of the distal internal carotid lumen as the denominator for stenosis measurement.  The following velocity measurements were obtained:                   PEAK SYSTOLIC/END DIASTOLIC RIGHT ICA:                        83/15cm/sec CCA:                        30/16WF/UXN SYSTOLIC ICA/CCA RATIO:      2.35 DIASTOLIC ICA/CCA RATIO:    1.51 ECA:                        113cm/sec  LEFT ICA:                        103/23cm/sec CCA:                        57/32KG/URK SYSTOLIC ICA/CCA RATIO:     2.70 DIASTOLIC ICA/CCA RATIO:    2.0 ECA:                        74cm/sec  Findings:  RIGHT CAROTID ARTERY: Small amounts of hypoechoic plaque are seen at the right carotid bulb into proximal right ICA.  Turbulent blood flow in proximal right ECA on color Doppler imaging.  Spectral broadening in right ICA on waveform analysis.  No high velocity jets.  RIGHT VERTEBRAL ARTERY:  Patent, antegrade  LEFT CAROTID ARTERY: Intimal thickening left CCA.  Small noncalcified plaques at left carotid bulb into proximal left ICA. Laminar flow on color Doppler imaging.  Waveform analysis unremarkable.  No high velocity jets.  LEFT VERTEBRAL ARTERY:  Patent, antegrade  IMPRESSION: Minimal hypoechoic plaque formation within carotid systems bilaterally.  No evidence of hemodynamically significant stenosis. Turbulent blood flow is identified at the proximal right external carotid artery, likely due to a combination of tortuosity and minimal bifurcation plaque, question accounting for right carotid bruit.   Original Report Authenticated By: Lavonia Dana, M.D.    Assessment & Plan:   Suhaylah was seen today for diabetes, hypertension and hyperlipidemia.  Diagnoses and all orders for this visit:  Hyperlipidemia Orders: -     CMP14+EGFR; Standing -     NMR, lipoprofile -     Cancel: Lipid panel -     CMP14+EGFR  Essential hypertension Orders: -     POCT CBC; Standing -     CMP14+EGFR; Standing -     POCT CBC -     CMP14+EGFR  Vitamin D deficiency Orders: -     Vit D  25 hydroxy (rtn osteoporosis monitoring); Standing -     Vit D  25 hydroxy (rtn osteoporosis monitoring)  Other fatigue Orders: -     POCT CBC; Standing -     Thyroid Panel With TSH -     Vit D  25 hydroxy (  rtn osteoporosis monitoring); Standing -     POCT  CBC -     Vit D  25 hydroxy (rtn osteoporosis monitoring)  Diabetic polyneuropathy associated with other specified diabetes mellitus Orders: -     POCT CBC; Standing -     POCT glycosylated hemoglobin (Hb A1C); Standing -     CMP14+EGFR; Standing -     NMR, lipoprofile -     Thyroid Panel With TSH -     Vit D  25 hydroxy (rtn osteoporosis monitoring); Standing -     POCT CBC -     POCT glycosylated hemoglobin (Hb A1C) -     CMP14+EGFR -     Vit D  25 hydroxy (rtn osteoporosis monitoring) -     metoprolol (LOPRESSOR) 100 MG tablet; Take 1 tablet (100 mg total) by mouth 2 (two) times daily. -     blood glucose meter kit and supplies KIT; Dispense based on patient and insurance preference. Use up to four times daily as directed. (FOR ICD-9 250.00, 250.01).   I have changed Kristi Willis's metoprolol succinate to metoprolol. I am also having her start on blood glucose meter kit and supplies. Additionally, I am having her maintain her vitamin B-12, multivitamin, Vitamin D3, Calcium Carbonate-Vitamin D, Pyridoxine HCl (VITAMIN B-6 PO), glucose blood, terazosin, EPIPEN 2-PAK, metFORMIN, lisinopril, citalopram, albuterol, montelukast, azelastine, loratadine, HYDROcodone-homatropine, and amLODipine.  Meds ordered this encounter  Medications  . metoprolol (LOPRESSOR) 100 MG tablet    Sig: Take 1 tablet (100 mg total) by mouth 2 (two) times daily.    Dispense:  180 tablet    Refill:  3  . blood glucose meter kit and supplies KIT    Sig: Dispense based on patient and insurance preference. Use up to four times daily as directed. (FOR ICD-9 250.00, 250.01).    Dispense:  1 each    Refill:  0    Order Specific Question:  Number of strips    Answer:  100    Order Specific Question:  Number of lancets    Answer:  100   Call Dr. Irving Shows today regarding left big toe lesion.  Follow-up: Return in about 3 months (around 04/14/2015).  Claretta Fraise, M.D.

## 2015-01-13 ENCOUNTER — Other Ambulatory Visit: Payer: Self-pay | Admitting: Family Medicine

## 2015-01-13 DIAGNOSIS — R928 Other abnormal and inconclusive findings on diagnostic imaging of breast: Secondary | ICD-10-CM

## 2015-01-13 LAB — NMR, LIPOPROFILE
CHOLESTEROL: 199 mg/dL (ref 100–199)
HDL Cholesterol by NMR: 40 mg/dL (ref >39)
HDL PARTICLE NUMBER: 33.1 umol/L (ref >=30.5)
LDL Particle Number: 1757 nmol/L — ABNORMAL HIGH (ref <1000)
LDL Size: 20.3 nm (ref >20.5)
LDL-C: 118 mg/dL — AB (ref 0–99)
LP-IR SCORE: 83 — AB (ref <=45)
SMALL LDL PARTICLE NUMBER: 1082 nmol/L — AB (ref <=527)
TRIGLYCERIDES BY NMR: 203 mg/dL — AB (ref 0–149)

## 2015-01-13 LAB — CMP14+EGFR
ALT: 41 IU/L — ABNORMAL HIGH (ref 0–32)
AST: 44 IU/L — AB (ref 0–40)
Albumin/Globulin Ratio: 1.8 (ref 1.1–2.5)
Albumin: 4.4 g/dL (ref 3.5–4.8)
Alkaline Phosphatase: 68 IU/L (ref 39–117)
BUN/Creatinine Ratio: 15 (ref 11–26)
BUN: 8 mg/dL (ref 8–27)
Bilirubin Total: 0.4 mg/dL (ref 0.0–1.2)
CALCIUM: 9.1 mg/dL (ref 8.7–10.3)
CO2: 23 mmol/L (ref 18–29)
CREATININE: 0.54 mg/dL — AB (ref 0.57–1.00)
Chloride: 96 mmol/L — ABNORMAL LOW (ref 97–108)
GFR calc Af Amer: 108 mL/min/{1.73_m2} (ref >59)
GFR, EST NON AFRICAN AMERICAN: 94 mL/min/{1.73_m2} (ref >59)
GLOBULIN, TOTAL: 2.5 g/dL (ref 1.5–4.5)
Glucose: 166 mg/dL — ABNORMAL HIGH (ref 65–99)
Potassium: 4.5 mmol/L (ref 3.5–5.2)
SODIUM: 138 mmol/L (ref 134–144)
TOTAL PROTEIN: 6.9 g/dL (ref 6.0–8.5)

## 2015-01-13 LAB — THYROID PANEL WITH TSH
FREE THYROXINE INDEX: 2.1 (ref 1.2–4.9)
T3 UPTAKE RATIO: 22 % — AB (ref 24–39)
T4, Total: 9.4 ug/dL (ref 4.5–12.0)
TSH: 3.34 u[IU]/mL (ref 0.450–4.500)

## 2015-01-13 LAB — VITAMIN D 25 HYDROXY (VIT D DEFICIENCY, FRACTURES): Vit D, 25-Hydroxy: 29.9 ng/mL — ABNORMAL LOW (ref 30.0–100.0)

## 2015-01-13 MED ORDER — VITAMIN D (ERGOCALCIFEROL) 1.25 MG (50000 UNIT) PO CAPS: 50000.0000 [IU] | ORAL_CAPSULE | ORAL | Status: DC

## 2015-01-13 MED ORDER — ROSUVASTATIN CALCIUM 10 MG PO TABS: 10.0000 mg | ORAL_TABLET | Freq: Every day | ORAL | Status: DC

## 2015-01-19 ENCOUNTER — Other Ambulatory Visit: Payer: Self-pay | Admitting: *Deleted

## 2015-01-19 ENCOUNTER — Telehealth: Payer: Self-pay | Admitting: Family Medicine

## 2015-01-19 NOTE — Telephone Encounter (Signed)
Called about - abnormal mamm - [facility]

## 2015-01-20 ENCOUNTER — Telehealth: Payer: Self-pay | Admitting: *Deleted

## 2015-01-20 DIAGNOSIS — R928 Other abnormal and inconclusive findings on diagnostic imaging of breast: Secondary | ICD-10-CM

## 2015-01-20 NOTE — Telephone Encounter (Signed)
Order placed for diag mamm.

## 2015-01-24 ENCOUNTER — Telehealth: Payer: Self-pay | Admitting: Family Medicine

## 2015-01-24 NOTE — Telephone Encounter (Signed)
Please review and advise.

## 2015-01-25 ENCOUNTER — Other Ambulatory Visit: Payer: Self-pay | Admitting: Nurse Practitioner

## 2015-01-25 DIAGNOSIS — R928 Other abnormal and inconclusive findings on diagnostic imaging of breast: Secondary | ICD-10-CM

## 2015-01-30 ENCOUNTER — Encounter: Payer: Self-pay | Admitting: Family Medicine

## 2015-02-01 ENCOUNTER — Ambulatory Visit
Admission: RE | Admit: 2015-02-01 | Discharge: 2015-02-01 | Disposition: A | Discharge status: Home / Self Care | Payer: Medicare Other | Source: Ambulatory Visit | Attending: Nurse Practitioner | Admitting: Nurse Practitioner

## 2015-02-01 ENCOUNTER — Other Ambulatory Visit: Payer: Self-pay | Admitting: Nurse Practitioner

## 2015-02-01 DIAGNOSIS — R928 Other abnormal and inconclusive findings on diagnostic imaging of breast: Secondary | ICD-10-CM

## 2015-02-01 DIAGNOSIS — N6320 Unspecified lump in the left breast, unspecified quadrant: Secondary | ICD-10-CM

## 2015-02-02 ENCOUNTER — Ambulatory Visit (INDEPENDENT_AMBULATORY_CARE_PROVIDER_SITE_OTHER): Payer: 59 | Admitting: *Deleted

## 2015-02-02 ENCOUNTER — Other Ambulatory Visit: Payer: Self-pay | Admitting: Nurse Practitioner

## 2015-02-02 DIAGNOSIS — Z23 Encounter for immunization: Secondary | ICD-10-CM | POA: Diagnosis not present

## 2015-02-02 DIAGNOSIS — N6320 Unspecified lump in the left breast, unspecified quadrant: Secondary | ICD-10-CM

## 2015-02-06 ENCOUNTER — Encounter: Payer: Self-pay | Admitting: Family Medicine

## 2015-02-09 ENCOUNTER — Ambulatory Visit
Admission: RE | Admit: 2015-02-09 | Discharge: 2015-02-09 | Disposition: A | Discharge status: Home / Self Care | Payer: Medicare Other | Source: Ambulatory Visit | Attending: Nurse Practitioner | Admitting: Nurse Practitioner

## 2015-02-09 DIAGNOSIS — N6320 Unspecified lump in the left breast, unspecified quadrant: Secondary | ICD-10-CM

## 2015-02-14 ENCOUNTER — Other Ambulatory Visit: Payer: Self-pay | Admitting: Family Medicine

## 2015-02-14 ENCOUNTER — Other Ambulatory Visit: Payer: Self-pay

## 2015-02-14 MED ORDER — METFORMIN HCL 1000 MG PO TABS: 1000.0000 mg | ORAL_TABLET | Freq: Two times a day (BID) | ORAL | Status: DC

## 2015-02-21 ENCOUNTER — Other Ambulatory Visit: Payer: Self-pay | Admitting: Family Medicine

## 2015-02-21 NOTE — Telephone Encounter (Signed)
Last seen and last VIT D 01/12/15  29.9  Dr Livia Snellen

## 2015-03-23 ENCOUNTER — Other Ambulatory Visit: Payer: Self-pay

## 2015-03-23 MED ORDER — ROSUVASTATIN CALCIUM 10 MG PO TABS: 10.0000 mg | ORAL_TABLET | Freq: Every day | ORAL | Status: DC

## 2015-03-25 ENCOUNTER — Other Ambulatory Visit: Payer: Self-pay | Admitting: Family Medicine

## 2015-03-29 ENCOUNTER — Other Ambulatory Visit: Payer: Self-pay | Admitting: Family Medicine

## 2015-03-29 DIAGNOSIS — Z780 Asymptomatic menopausal state: Secondary | ICD-10-CM

## 2015-04-12 ENCOUNTER — Other Ambulatory Visit (INDEPENDENT_AMBULATORY_CARE_PROVIDER_SITE_OTHER): Payer: Medicare Other

## 2015-04-12 DIAGNOSIS — I10 Essential (primary) hypertension: Secondary | ICD-10-CM

## 2015-04-12 DIAGNOSIS — E119 Type 2 diabetes mellitus without complications: Secondary | ICD-10-CM

## 2015-04-12 DIAGNOSIS — E785 Hyperlipidemia, unspecified: Secondary | ICD-10-CM

## 2015-04-12 NOTE — Addendum Note (Signed)
Addended by: Selmer Dominion on: 04/12/2015 05:43 PM   Modules accepted: Orders

## 2015-04-13 LAB — CBC WITH DIFFERENTIAL/PLATELET
BASOS: 0 %
Basophils Absolute: 0 10*3/uL (ref 0.0–0.2)
EOS (ABSOLUTE): 0.4 10*3/uL (ref 0.0–0.4)
Eos: 5 %
HEMATOCRIT: 38.7 % (ref 34.0–46.6)
Hemoglobin: 12.6 g/dL (ref 11.1–15.9)
Immature Grans (Abs): 0 10*3/uL (ref 0.0–0.1)
Immature Granulocytes: 0 %
Lymphocytes Absolute: 3 10*3/uL (ref 0.7–3.1)
Lymphs: 38 %
MCH: 28.6 pg (ref 26.6–33.0)
MCHC: 32.6 g/dL (ref 31.5–35.7)
MCV: 88 fL (ref 79–97)
MONOS ABS: 0.6 10*3/uL (ref 0.1–0.9)
Monocytes: 8 %
NEUTROS ABS: 3.9 10*3/uL (ref 1.4–7.0)
Neutrophils: 49 %
Platelets: 222 10*3/uL (ref 150–379)
RBC: 4.4 x10E6/uL (ref 3.77–5.28)
RDW: 13.8 % (ref 12.3–15.4)
WBC: 7.9 10*3/uL (ref 3.4–10.8)

## 2015-04-13 LAB — CMP14+EGFR
ALT: 27 IU/L (ref 0–32)
AST: 35 IU/L (ref 0–40)
Albumin/Globulin Ratio: 1.6 (ref 1.1–2.5)
Albumin: 4.4 g/dL (ref 3.5–4.8)
Alkaline Phosphatase: 61 IU/L (ref 39–117)
BILIRUBIN TOTAL: 0.2 mg/dL (ref 0.0–1.2)
BUN/Creatinine Ratio: 20 (ref 11–26)
BUN: 11 mg/dL (ref 8–27)
CALCIUM: 9.2 mg/dL (ref 8.7–10.3)
CO2: 24 mmol/L (ref 18–29)
Chloride: 98 mmol/L (ref 97–108)
Creatinine, Ser: 0.56 mg/dL — ABNORMAL LOW (ref 0.57–1.00)
GFR calc non Af Amer: 93 mL/min/{1.73_m2} (ref >59)
GFR, EST AFRICAN AMERICAN: 107 mL/min/{1.73_m2} (ref >59)
GLUCOSE: 169 mg/dL — AB (ref 65–99)
Globulin, Total: 2.8 g/dL (ref 1.5–4.5)
Potassium: 4.4 mmol/L (ref 3.5–5.2)
Sodium: 138 mmol/L (ref 134–144)
TOTAL PROTEIN: 7.2 g/dL (ref 6.0–8.5)

## 2015-04-13 LAB — LIPID PANEL
Chol/HDL Ratio: 3.5 ratio units (ref 0.0–4.4)
Cholesterol, Total: 174 mg/dL (ref 100–199)
HDL: 50 mg/dL (ref >39)
LDL Calculated: 89 mg/dL (ref 0–99)
Triglycerides: 174 mg/dL — ABNORMAL HIGH (ref 0–149)
VLDL CHOLESTEROL CAL: 35 mg/dL (ref 5–40)

## 2015-04-14 ENCOUNTER — Ambulatory Visit: Payer: Medicare Other

## 2015-04-14 ENCOUNTER — Ambulatory Visit (INDEPENDENT_AMBULATORY_CARE_PROVIDER_SITE_OTHER): Payer: Medicare Other | Admitting: Family Medicine

## 2015-04-14 ENCOUNTER — Encounter: Payer: Self-pay | Admitting: Family Medicine

## 2015-04-14 VITALS — BP 154/59 | HR 60 | Temp 97.9°F | Ht 63.0 in | Wt 163.2 lb

## 2015-04-14 DIAGNOSIS — J206 Acute bronchitis due to rhinovirus: Secondary | ICD-10-CM

## 2015-04-14 DIAGNOSIS — I10 Essential (primary) hypertension: Secondary | ICD-10-CM | POA: Diagnosis not present

## 2015-04-14 DIAGNOSIS — E119 Type 2 diabetes mellitus without complications: Secondary | ICD-10-CM | POA: Diagnosis not present

## 2015-04-14 DIAGNOSIS — E785 Hyperlipidemia, unspecified: Secondary | ICD-10-CM | POA: Diagnosis not present

## 2015-04-14 LAB — POCT GLYCOSYLATED HEMOGLOBIN (HGB A1C): Hemoglobin A1C: 7.7

## 2015-04-14 MED ORDER — ROSUVASTATIN CALCIUM 10 MG PO TABS
10.0000 mg | ORAL_TABLET | Freq: Every day | ORAL | Status: DC
Start: 2015-04-14 — End: 2016-04-27

## 2015-04-14 MED ORDER — METFORMIN HCL ER 500 MG PO TB24: 500.0000 mg | ORAL_TABLET | Freq: Every day | ORAL | Status: DC

## 2015-04-14 MED ORDER — CITALOPRAM HYDROBROMIDE 20 MG PO TABS: 20.0000 mg | ORAL_TABLET | Freq: Every day | ORAL | Status: DC

## 2015-04-14 MED ORDER — AMLODIPINE BESYLATE 5 MG PO TABS: 5.0000 mg | ORAL_TABLET | Freq: Every day | ORAL | Status: DC

## 2015-04-14 MED ORDER — MONTELUKAST SODIUM 10 MG PO TABS: 10.0000 mg | ORAL_TABLET | Freq: Every day | ORAL | Status: DC

## 2015-04-14 MED ORDER — ALBUTEROL SULFATE HFA 108 (90 BASE) MCG/ACT IN AERS: 2.0000 | INHALATION_SPRAY | Freq: Four times a day (QID) | RESPIRATORY_TRACT | Status: DC | PRN

## 2015-04-14 MED ORDER — METFORMIN HCL ER 500 MG PO TB24: 1000.0000 mg | ORAL_TABLET | Freq: Two times a day (BID) | ORAL | Status: DC

## 2015-04-14 MED ORDER — METFORMIN HCL 1000 MG PO TABS: 1000.0000 mg | ORAL_TABLET | Freq: Two times a day (BID) | ORAL | Status: DC

## 2015-04-14 MED ORDER — LISINOPRIL 40 MG PO TABS: 40.0000 mg | ORAL_TABLET | Freq: Every day | ORAL | Status: DC

## 2015-04-14 NOTE — Progress Notes (Signed)
Subjective:  Patient ID: Kristi Willis, female    DOB: [birth year]  Age: 74 y.o. MRN: [id number]  CC: Diabetes; Hypertension; and Hyperlipidemia   HPI Kristi Willis presents for  follow-up of hypertension. Patient has no history of headache chest pain or shortness of breath or recent cough. Patient also denies symptoms of TIA such as numbness weakness lateralizing. Patient checks  blood pressure at home and has not had any elevated readings recently. Patient denies side effects from his medication. States taking it regularly.  Patient also  in for follow-up of elevated cholesterol. Doing well without complaints on current medication. Denies side effects of statin including myalgia and arthralgia and nausea. Also in today for liver function testing. Currently no chest pain, shortness of breath or other cardiovascular related symptoms noted.  Follow-up of diabetes. Patient does check blood sugar at home. Readings run between 90 and 140 Patient denies symptoms such as polyuria, polydipsia, excessive hunger, nausea No significant hypoglycemic spells noted. Medications as noted below. Taking them regularly without complication/adverse reaction being reported today.    History Kristi Willis has a past medical history of SVT (supraventricular tachycardia); Hypertension; Diabetes mellitus; Hypercholesteremia; Neuropathy; Asthma; GERD (gastroesophageal reflux disease); Cancer; Neuromuscular disorder; and Allergy.   She has past surgical history that includes Lumbar laminectomy (08/10); Spine surgery; Breast biopsy; and Skin cancer excision (2005).   Her family history includes Cancer in her father; Cancer (age of onset: 20) in her mother; Cancer (age of onset: 98) in her sister; Depression in her mother; Heart disease in her father; Heart disease (age of onset: 36) in her sister; Hypertension in her sister; Stroke in her father.She reports that she has never smoked. She has never used smokeless  tobacco. She reports that she does not drink alcohol or use illicit drugs.  Current Outpatient Prescriptions on File Prior to Visit  Medication Sig Dispense Refill  . azelastine (ASTELIN) 0.1 % nasal spray Place 2 sprays into both nostrils 2 (two) times daily. 30 mL 11  . Calcium Carbonate-Vitamin D (CALTRATE 600+D) 600-400 MG-UNIT per tablet Take 1 tablet by mouth 2 (two) times daily.    . Cholecalciferol (VITAMIN D3) 1000 UNITS CAPS Take by mouth daily.      Marland Kitchen EPIPEN 2-PAK 0.3 MG/0.3ML SOAJ injection     . loratadine (CLARITIN) 10 MG tablet Take 1 tablet (10 mg total) by mouth daily. 30 tablet 11  . metoprolol (LOPRESSOR) 100 MG tablet Take 1 tablet (100 mg total) by mouth 2 (two) times daily. 180 tablet 3  . Multiple Vitamin (MULTIVITAMIN) tablet Take 1 tablet by mouth daily.      . Pyridoxine HCl (VITAMIN B-6 PO) Take 1 tablet by mouth 2 (two) times daily.    Marland Kitchen terazosin (HYTRIN) 2 MG capsule TAKE 1 CAPSULE AT BEDTIME 30 capsule 3  . vitamin B-12 (CYANOCOBALAMIN) 500 MCG tablet Take 500 mcg by mouth daily.       No current facility-administered medications on file prior to visit.    ROS Review of Systems  Constitutional: Negative for fever, chills, diaphoresis, activity change, appetite change, fatigue and unexpected weight change.  HENT: Positive for congestion, postnasal drip, rhinorrhea and sinus pressure. Negative for ear discharge, ear pain, hearing loss, nosebleeds, sneezing, sore throat and trouble swallowing.   Eyes: Negative for pain.  Respiratory: Negative for cough, chest tightness and shortness of breath.   Cardiovascular: Negative for chest pain and palpitations.  Gastrointestinal: Negative for nausea, vomiting, abdominal pain, diarrhea and constipation.  Genitourinary: Negative for dysuria, frequency and menstrual problem.  Musculoskeletal: Negative for joint swelling and arthralgias.  Skin: Negative for rash.  Neurological: Negative for dizziness, weakness, numbness  and headaches.  Psychiatric/Behavioral: Negative for dysphoric mood and agitation.    Objective:  BP 154/59 mmHg  Pulse 60  Temp(Src) 97.9 F (36.6 C) (Oral)  Ht _0  (1.6 m)  Wt 163 lb 3.2 oz (74.027 kg)  BMI 28.92 kg/m2  BP Readings from Last 3 Encounters:  04/14/15 154/59  12/22/14 150/56  08/24/14 159/60    Wt Readings from Last 3 Encounters:  04/14/15 163 lb 3.2 oz (74.027 kg)  12/22/14 164 lb (74.39 kg)  08/24/14 167 lb 12.8 oz (76.114 kg)     Physical Exam  Constitutional: She is oriented to person, place, and time. She appears well-developed and well-nourished. No distress.  HENT:  Head: Normocephalic and atraumatic.  Right Ear: Tympanic membrane and external ear normal. No decreased hearing is noted.  Left Ear: Tympanic membrane and external ear normal. No decreased hearing is noted.  Nose: Mucosal edema present. Right sinus exhibits no frontal sinus tenderness. Left sinus exhibits no frontal sinus tenderness.  Mouth/Throat: No oropharyngeal exudate or posterior oropharyngeal erythema.  Eyes: Conjunctivae are normal. Pupils are equal, round, and reactive to light.  Neck: Normal range of motion. Neck supple. No Brudzinski's sign noted. No thyromegaly present.  Cardiovascular: Normal rate, regular rhythm and normal heart sounds.   No murmur heard. Pulmonary/Chest: Effort normal and breath sounds normal. No respiratory distress. She has no wheezes. She has no rales.  Abdominal: Soft. Bowel sounds are normal. She exhibits no distension. There is no tenderness.  Musculoskeletal: Normal range of motion.  Lymphadenopathy:       Head (right side): No preauricular adenopathy present.       Head (left side): No preauricular adenopathy present.    She has no cervical adenopathy.       Right cervical: No superficial cervical adenopathy present.      Left cervical: No superficial cervical adenopathy present.  Neurological: She is alert and oriented to person, place, and  time.  Skin: Skin is warm and dry.  Psychiatric: She has a normal mood and affect. Her behavior is normal. Judgment and thought content normal.    Lab Results  Component Value Date   HGBA1C 7.7 04/14/2015   HGBA1C 7.5 01/12/2015   HGBA1C 6.8 05/31/2014    Lab Results  Component Value Date   WBC 7.9 04/12/2015   HGB 12.9 01/12/2015   HCT 38.7 04/12/2015   PLT 257 06/11/2012   GLUCOSE 169* 04/12/2015   CHOL 174 04/12/2015   TRIG 174* 04/12/2015   HDL 50 04/12/2015   LDLCALC 89 04/12/2015   ALT 27 04/12/2015   AST 35 04/12/2015   NA 138 04/12/2015   K 4.4 04/12/2015   CL 98 04/12/2015   CREATININE 0.56* 04/12/2015   BUN 11 04/12/2015   CO2 24 04/12/2015   TSH 3.340 01/12/2015   INR 1.0 04/11/2009   HGBA1C 7.7 04/14/2015   MICROALBUR 1.45 11/25/2012    Mm Digital Diagnostic Unilat L  02/09/2015   CLINICAL DATA:  Status post left 630 o'clock breast core needle biopsy.  EXAM: DIAGNOSTIC LEFT MAMMOGRAM POST ULTRASOUND BIOPSY  COMPARISON:  Previous exam(s).  FINDINGS: Mammographic images were obtained following ultrasound guided biopsy of the left 630 o'clock breast area of shadowing. Coil shaped metallic marker is seen within the 630 o'clock left breast, middle depth.  IMPRESSION: Successful  post biopsy metallic marker placement.  Final Assessment: Post Procedure Mammograms for Marker Placement   Electronically Signed   By: Fidela Salisbury M.D.   On: 02/09/2015 16:58   [place] Lt Breast Bx W Loc Dev 1st Lesion Img Bx Spec US Guide  02/13/2015   ADDENDUM REPORT: 02/13/2015 12:52  ADDENDUM: Pathology reveals Left breast fibrocystic changes with focal sclerosing adenosis and associated microcalcification. This was found to be concordant by Dr. Fidela Salisbury. Pathology results were discussed with the patient via telephone. The patient reported tenderness at the biopsy site. Post biopsy instructions were reviewed and questions were answered. The patient was encouraged to call [facility] with any additional questions and or concerns. The patient was instructed to return in 6 months for a follow up mammogram and ultrasound of the Left breast. The patient was informed a reminder notice would be sent regarding this appointment.  Pathology results reported by Terie Purser RN on February 13, 2015.   Electronically Signed   By: Fidela Salisbury M.D.   On: 02/13/2015 12:52   02/13/2015   CLINICAL DATA:  Left breast 630 o'clock ill defined area of shadowing, seen on most recent diagnostic ultrasound.  EXAM: ULTRASOUND GUIDED LEFT BREAST CORE NEEDLE BIOPSY  COMPARISON:  01/11/2015, 02/01/2015  FINDINGS: I met with the patient and we discussed the procedure of ultrasound-guided biopsy, including benefits and alternatives. We discussed the high likelihood of a successful procedure. We discussed the risks of the procedure, including infection, bleeding, tissue injury, clip migration, and inadequate sampling. Informed written consent was given. The usual time-out protocol was performed immediately prior to the procedure.  Using sterile technique and 2% Lidocaine as local anesthetic, under direct ultrasound visualization, a 12 gauge spring-loaded device was used to perform biopsy of left breast using a lateral approach. At the conclusion of the procedure a coil shaped tissue marker clip was deployed into the biopsy cavity. Follow up 2 view mammogram was performed and dictated separately.  IMPRESSION: Ultrasound guided biopsy of left breast.  No apparent complications.  Electronically Signed: By: Fidela Salisbury M.D. On: 02/09/2015 16:43    Assessment & Plan:   Kristi Willis was seen today for diabetes, hypertension and hyperlipidemia.  Diagnoses and all orders for this visit:  Acute bronchitis due to Rhinovirus -     montelukast (SINGULAIR) 10 MG tablet; Take 1 tablet (10 mg total) by mouth at bedtime. -     albuterol (VENTOLIN HFA) 108 (90 BASE) MCG/ACT inhaler;  Inhale 2 puffs into the lungs every 6 (six) hours as needed for wheezing.  Type 2 diabetes mellitus without complication  Essential hypertension  Hyperlipidemia  Other orders -     lisinopril (PRINIVIL,ZESTRIL) 40 MG tablet; Take 1 tablet (40 mg total) by mouth daily. TAKE 1 TABLET DAILY -     Discontinue: metFORMIN (GLUCOPHAGE) 1000 MG tablet; Take 1 tablet (1,000 mg total) by mouth 2 (two) times daily with a meal. -     rosuvastatin (CRESTOR) 10 MG tablet; Take 1 tablet (10 mg total) by mouth daily. -     amLODipine (NORVASC) 5 MG tablet; Take 1 tablet (5 mg total) by mouth daily. -     citalopram (CELEXA) 20 MG tablet; Take 1 tablet (20 mg total) by mouth daily. -     Discontinue: metFORMIN (GLUCOPHAGE-XR) 500 MG 24 hr tablet; Take 1 tablet (500 mg total) by mouth daily with breakfast. -     metFORMIN (GLUCOPHAGE-XR) 500 MG 24  hr tablet; Take 2 tablets (1,000 mg total) by mouth 2 (two) times daily.   I have discontinued Kristi Willis's glucose blood, HYDROcodone-homatropine, blood glucose meter kit and supplies, metFORMIN, Vitamin D (Ergocalciferol), and metFORMIN. I have also changed her amLODipine and metFORMIN. Additionally, I am having her maintain her vitamin B-12, multivitamin, Vitamin D3, Calcium Carbonate-Vitamin D, Pyridoxine HCl (VITAMIN B-6 PO), terazosin, EPIPEN 2-PAK, azelastine, loratadine, metoprolol, lisinopril, montelukast, rosuvastatin, albuterol, and citalopram.  Meds ordered this encounter  Medications  . lisinopril (PRINIVIL,ZESTRIL) 40 MG tablet    Sig: Take 1 tablet (40 mg total) by mouth daily. TAKE 1 TABLET DAILY    Dispense:  30 tablet    Refill:  5  . DISCONTD: metFORMIN (GLUCOPHAGE) 1000 MG tablet    Sig: Take 1 tablet (1,000 mg total) by mouth 2 (two) times daily with a meal.    Dispense:  60 tablet    Refill:  2  . montelukast (SINGULAIR) 10 MG tablet    Sig: Take 1 tablet (10 mg total) by mouth at bedtime.    Dispense:  30 tablet    Refill:  5  .  rosuvastatin (CRESTOR) 10 MG tablet    Sig: Take 1 tablet (10 mg total) by mouth daily.    Dispense:  90 tablet    Refill:  3  . albuterol (VENTOLIN HFA) 108 (90 BASE) MCG/ACT inhaler    Sig: Inhale 2 puffs into the lungs every 6 (six) hours as needed for wheezing.    Dispense:  1 Inhaler    Refill:  3  . amLODipine (NORVASC) 5 MG tablet    Sig: Take 1 tablet (5 mg total) by mouth daily.    Dispense:  90 tablet    Refill:  3  . citalopram (CELEXA) 20 MG tablet    Sig: Take 1 tablet (20 mg total) by mouth daily.    Dispense:  30 tablet    Refill:  5  . DISCONTD: metFORMIN (GLUCOPHAGE-XR) 500 MG 24 hr tablet    Sig: Take 1 tablet (500 mg total) by mouth daily with breakfast.    Dispense:  30 tablet    Refill:  2  . metFORMIN (GLUCOPHAGE-XR) 500 MG 24 hr tablet    Sig: Take 2 tablets (1,000 mg total) by mouth 2 (two) times daily.    Dispense:  120 tablet    Refill:  2     Follow-up: Return in about 3 months (around 07/15/2015).  Claretta Fraise, M.D.

## 2015-06-06 ENCOUNTER — Encounter: Payer: Self-pay | Admitting: Family Medicine

## 2015-06-20 ENCOUNTER — Ambulatory Visit (INDEPENDENT_AMBULATORY_CARE_PROVIDER_SITE_OTHER): Payer: Medicare Other

## 2015-06-20 DIAGNOSIS — Z780 Asymptomatic menopausal state: Secondary | ICD-10-CM

## 2015-06-26 ENCOUNTER — Encounter: Payer: Self-pay | Admitting: Internal Medicine

## 2015-07-18 ENCOUNTER — Ambulatory Visit (INDEPENDENT_AMBULATORY_CARE_PROVIDER_SITE_OTHER): Payer: Medicare Other | Admitting: Family Medicine

## 2015-07-18 ENCOUNTER — Encounter: Payer: Self-pay | Admitting: Family Medicine

## 2015-07-18 VITALS — BP 149/52 | HR 52 | Temp 97.9°F | Ht 63.0 in | Wt 165.0 lb

## 2015-07-18 DIAGNOSIS — E785 Hyperlipidemia, unspecified: Secondary | ICD-10-CM

## 2015-07-18 DIAGNOSIS — F4321 Adjustment disorder with depressed mood: Secondary | ICD-10-CM | POA: Diagnosis not present

## 2015-07-18 DIAGNOSIS — G4700 Insomnia, unspecified: Secondary | ICD-10-CM | POA: Diagnosis not present

## 2015-07-18 DIAGNOSIS — E119 Type 2 diabetes mellitus without complications: Secondary | ICD-10-CM

## 2015-07-18 DIAGNOSIS — I10 Essential (primary) hypertension: Secondary | ICD-10-CM

## 2015-07-18 DIAGNOSIS — Z23 Encounter for immunization: Secondary | ICD-10-CM

## 2015-07-18 LAB — POCT GLYCOSYLATED HEMOGLOBIN (HGB A1C): HEMOGLOBIN A1C: 7.9

## 2015-07-18 MED ORDER — MIRTAZAPINE 30 MG PO TABS: 30.0000 mg | ORAL_TABLET | Freq: Every day | ORAL | Status: DC

## 2015-07-18 MED ORDER — AMLODIPINE BESYLATE 10 MG PO TABS: 10.0000 mg | ORAL_TABLET | Freq: Every day | ORAL | Status: DC

## 2015-07-18 MED ORDER — SITAGLIPTIN PHOSPHATE 100 MG PO TABS: 100.0000 mg | ORAL_TABLET | Freq: Every day | ORAL | Status: DC

## 2015-07-18 NOTE — Progress Notes (Signed)
Subjective:  Patient ID: Kristi Willis, female    DOB: 12-01-1940  Age: 74 y.o. MRN: [id number]  CC: 3 month follow up   HPI Kristi Willis presents for  follow-up of hypertension. Patient has no history of headache chest pain or shortness of breath or recent cough. Patient also denies symptoms of TIA such as numbness weakness lateralizing. Patient checks  blood pressure at home and has not had any elevated readings recently. Patient denies side effects from his medication. States taking it regularly.  Patient also  in for follow-up of elevated cholesterol. Doing well without complaints on current medication. Denies side effects of statin including myalgia and arthralgia and nausea. Also in today for liver function testing. Currently no chest pain, shortness of breath or other cardiovascular related symptoms noted.  Follow-up of diabetes. Patient does not check  blood sugar at home.  Patient denies symptoms such as polyuria, polydipsia, excessive hunger, nausea No significant hypoglycemic spells noted. Medications as noted below. Taking them regularly without complication/adverse reaction being reported today.      History Kristi Willis has a past medical history of SVT (supraventricular tachycardia) ([place]); Hypertension; Diabetes mellitus; Hypercholesteremia; Neuropathy ([place]); Asthma; GERD (gastroesophageal reflux disease); Cancer ([place]); Neuromuscular disorder ([place]); and Allergy.   She has past surgical history that includes Lumbar laminectomy (08/10); Spine surgery; Breast biopsy; and Skin cancer excision (2005).   Her family history includes Cancer in her father; Cancer (age of onset: 106) in her mother; Cancer (age of onset: 21) in her sister; Depression in her mother; Heart disease in her father; Heart disease (age of onset: 85) in her sister; Hypertension in her sister; Stroke in her father.She reports that she has never smoked. She has never used smokeless tobacco. She reports  that she does not drink alcohol or use illicit drugs.  Current Outpatient Prescriptions on File Prior to Visit  Medication Sig Dispense Refill  . albuterol (VENTOLIN HFA) 108 (90 BASE) MCG/ACT inhaler Inhale 2 puffs into the lungs every 6 (six) hours as needed for wheezing. 1 Inhaler 3  . azelastine (ASTELIN) 0.1 % nasal spray Place 2 sprays into both nostrils 2 (two) times daily. 30 mL 11  . Calcium Carbonate-Vitamin D (CALTRATE 600+D) 600-400 MG-UNIT per tablet Take 1 tablet by mouth 2 (two) times daily.    . Cholecalciferol (VITAMIN D3) 1000 UNITS CAPS Take by mouth daily.      Marland Kitchen EPIPEN 2-PAK 0.3 MG/0.3ML SOAJ injection     . lisinopril (PRINIVIL,ZESTRIL) 40 MG tablet Take 1 tablet (40 mg total) by mouth daily. TAKE 1 TABLET DAILY 30 tablet 5  . loratadine (CLARITIN) 10 MG tablet Take 1 tablet (10 mg total) by mouth daily. 30 tablet 11  . metFORMIN (GLUCOPHAGE-XR) 500 MG 24 hr tablet Take 2 tablets (1,000 mg total) by mouth 2 (two) times daily. 120 tablet 2  . metoprolol (LOPRESSOR) 100 MG tablet Take 1 tablet (100 mg total) by mouth 2 (two) times daily. 180 tablet 3  . montelukast (SINGULAIR) 10 MG tablet Take 1 tablet (10 mg total) by mouth at bedtime. 30 tablet 5  . Multiple Vitamin (MULTIVITAMIN) tablet Take 1 tablet by mouth daily.      . Pyridoxine HCl (VITAMIN B-6 PO) Take 1 tablet by mouth 2 (two) times daily.    . rosuvastatin (CRESTOR) 10 MG tablet Take 1 tablet (10 mg total) by mouth daily. 90 tablet 3  . terazosin (HYTRIN) 2 MG capsule TAKE 1 CAPSULE AT BEDTIME 30 capsule 3  .  vitamin B-12 (CYANOCOBALAMIN) 500 MCG tablet Take 500 mcg by mouth daily.       No current facility-administered medications on file prior to visit.    ROS Review of Systems  Constitutional: Negative for fever, activity change and appetite change.  HENT: Negative for congestion, rhinorrhea and sore throat.   Eyes: Negative for visual disturbance.  Respiratory: Negative for cough and shortness of  breath.   Cardiovascular: Negative for chest pain and palpitations.  Gastrointestinal: Negative for nausea, abdominal pain and diarrhea.  Genitourinary: Negative for dysuria.  Musculoskeletal: Negative for myalgias and arthralgias.  Psychiatric/Behavioral: Positive for sleep disturbance, dysphoric mood (sad, but not crying) and decreased concentration. Negative for behavioral problems and confusion.    Objective:  BP 149/52 mmHg  Pulse 52  Temp(Src) 97.9 F (36.6 C) (Oral)  Ht '5\' 3"'  (1.6 m)  Wt 165 lb (74.844 kg)  BMI 29.24 kg/m2  BP Readings from Last 3 Encounters:  07/18/15 149/52  04/14/15 154/59  12/22/14 150/56    Wt Readings from Last 3 Encounters:  07/18/15 165 lb (74.844 kg)  04/14/15 163 lb 3.2 oz (74.027 kg)  12/22/14 164 lb (74.39 kg)     Physical Exam  Constitutional: She is oriented to person, place, and time. She appears well-developed and well-nourished. No distress.  HENT:  Head: Normocephalic and atraumatic.  Right Ear: External ear normal.  Left Ear: External ear normal.  Nose: Nose normal.  Mouth/Throat: Oropharynx is clear and moist.  Eyes: Conjunctivae and EOM are normal. Pupils are equal, round, and reactive to light.  Neck: Normal range of motion. Neck supple. No thyromegaly present.  Cardiovascular: Normal rate, regular rhythm and normal heart sounds.   No murmur heard. Pulmonary/Chest: Effort normal and breath sounds normal. No respiratory distress. She has no wheezes. She has no rales.  Abdominal: Soft. Bowel sounds are normal. She exhibits no distension. There is no tenderness.  Lymphadenopathy:    She has no cervical adenopathy.  Neurological: She is alert and oriented to person, place, and time. She has normal reflexes.  Skin: Skin is warm and dry.  Psychiatric: She has a normal mood and affect. Her speech is normal and behavior is normal. Judgment and thought content normal. Cognition and memory are normal.  Somewhat sad, but affect is  appropriate    Lab Results  Component Value Date   HGBA1C 7.9 07/18/2015   HGBA1C 7.7 04/14/2015   HGBA1C 7.5 01/12/2015    Lab Results  Component Value Date   WBC 7.9 04/12/2015   HGB 12.9 01/12/2015   HCT 38.7 04/12/2015   PLT 257 06/11/2012   GLUCOSE 169* 04/12/2015   CHOL 174 04/12/2015   TRIG 174* 04/12/2015   HDL 50 04/12/2015   LDLCALC 89 04/12/2015   ALT 27 04/12/2015   AST 35 04/12/2015   NA 138 04/12/2015   K 4.4 04/12/2015   CL 98 04/12/2015   CREATININE 0.56* 04/12/2015   BUN 11 04/12/2015   CO2 24 04/12/2015   TSH 3.340 01/12/2015   INR 1.0 04/11/2009   HGBA1C 7.9 07/18/2015   MICROALBUR 1.45 11/25/2012    Mm Digital Diagnostic Unilat L  02/09/2015  CLINICAL DATA:  Status post left 630 o'clock breast core needle biopsy. EXAM: DIAGNOSTIC LEFT MAMMOGRAM POST ULTRASOUND BIOPSY COMPARISON:  Previous exam(s). FINDINGS: Mammographic images were obtained following ultrasound guided biopsy of the left 630 o'clock breast area of shadowing. Coil shaped metallic marker is seen within the 630 o'clock left breast, middle depth. IMPRESSION:  Successful post biopsy metallic marker placement. Final Assessment: Post Procedure Mammograms for Marker Placement Electronically Signed   By: Fidela Salisbury M.D.   On: 02/09/2015 16:58   [place] Lt Breast Bx W Loc Dev 1st Lesion Img Bx Spec US Guide  02/13/2015  ADDENDUM REPORT: 02/13/2015 12:52 ADDENDUM: Pathology reveals Left breast fibrocystic changes with focal sclerosing adenosis and associated microcalcification. This was found to be concordant by Dr. Fidela Salisbury. Pathology results were discussed with the patient via telephone. The patient reported tenderness at the biopsy site. Post biopsy instructions were reviewed and questions were answered. The patient was encouraged to call [facility] with any additional questions and or concerns. The patient was instructed to return in 6 months for a follow up  mammogram and ultrasound of the Left breast. The patient was informed a reminder notice would be sent regarding this appointment. Pathology results reported by Terie Purser RN on February 13, 2015. Electronically Signed   By: Fidela Salisbury M.D.   On: 02/13/2015 12:52   02/13/2015  CLINICAL DATA:  Left breast 630 o'clock ill defined area of shadowing, seen on most recent diagnostic ultrasound. EXAM: ULTRASOUND GUIDED LEFT BREAST CORE NEEDLE BIOPSY COMPARISON:  01/11/2015, 02/01/2015 FINDINGS: I met with the patient and we discussed the procedure of ultrasound-guided biopsy, including benefits and alternatives. We discussed the high likelihood of a successful procedure. We discussed the risks of the procedure, including infection, bleeding, tissue injury, clip migration, and inadequate sampling. Informed written consent was given. The usual time-out protocol was performed immediately prior to the procedure. Using sterile technique and 2% Lidocaine as local anesthetic, under direct ultrasound visualization, a 12 gauge spring-loaded device was used to perform biopsy of left breast using a lateral approach. At the conclusion of the procedure a coil shaped tissue marker clip was deployed into the biopsy cavity. Follow up 2 view mammogram was performed and dictated separately. IMPRESSION: Ultrasound guided biopsy of left breast.  No apparent complications. Electronically Signed: By: Fidela Salisbury M.D. On: 02/09/2015 16:43    Assessment & Plan:   [place] was seen today for 3 month follow up.  Diagnoses and all orders for this visit:  Uncontrolled hypertension  Type 2 diabetes mellitus without complication, without long-term current use of insulin (HCC) -     POCT glycosylated hemoglobin (Hb A1C) -     CMP14+EGFR -     Microalbumin / creatinine urine ratio -     POCT urinalysis dipstick  Hyperlipidemia  Adjustment disorder with depressed mood -     mirtazapine (REMERON) 30 MG tablet; Take 1  tablet (30 mg total) by mouth at bedtime.  Insomnia  Other orders -     sitaGLIPtin (JANUVIA) 100 MG tablet; Take 1 tablet (100 mg total) by mouth daily. -     amLODipine (NORVASC) 10 MG tablet; Take 1 tablet (10 mg total) by mouth daily.   I have discontinued Ms. Hinde's citalopram. I have also changed her amLODipine. Additionally, I am having her start on mirtazapine and sitaGLIPtin. Lastly, I am having her maintain her vitamin B-12, multivitamin, Vitamin D3, Calcium Carbonate-Vitamin D, Pyridoxine HCl (VITAMIN B-6 PO), terazosin, EPIPEN 2-PAK, azelastine, loratadine, metoprolol, lisinopril, montelukast, rosuvastatin, albuterol, and metFORMIN.  Meds ordered this encounter  Medications  . mirtazapine (REMERON) 30 MG tablet    Sig: Take 1 tablet (30 mg total) by mouth at bedtime.    Dispense:  30 tablet    Refill:  5  . sitaGLIPtin (  JANUVIA) 100 MG tablet    Sig: Take 1 tablet (100 mg total) by mouth daily.    Dispense:  30 tablet    Refill:  5  . amLODipine (NORVASC) 10 MG tablet    Sig: Take 1 tablet (10 mg total) by mouth daily.    Dispense:  30 tablet    Refill:  5     Follow-up: Return in about 3 months (around 10/17/2015) for diabetes, hypertension, Depression, cholesterol.  Claretta Fraise, M.D.

## 2015-07-18 NOTE — Patient Instructions (Signed)
DASH Eating Plan  DASH stands for "Dietary Approaches to Stop Hypertension." The DASH eating plan is a healthy eating plan that has been shown to reduce high blood pressure (hypertension). Additional health benefits may include reducing the risk of type 2 diabetes mellitus, heart disease, and stroke. The DASH eating plan may also help with weight loss.  WHAT DO I NEED TO KNOW ABOUT THE DASH EATING PLAN?  For the DASH eating plan, you will follow these general guidelines:  · Choose foods with a percent daily value for sodium of less than 5% (as listed on the food label).  · Use salt-free seasonings or herbs instead of table salt or sea salt.  · Check with your health care provider or pharmacist before using salt substitutes.  · Eat lower-sodium products, often labeled as "lower sodium" or "no salt added."  · Eat fresh foods.  · Eat more vegetables, fruits, and low-fat dairy products.  · Choose whole grains. Look for the word "whole" as the first word in the ingredient list.  · Choose fish and skinless chicken or turkey more often than red meat. Limit fish, poultry, and meat to 6 oz (170 g) each day.  · Limit sweets, desserts, sugars, and sugary drinks.  · Choose heart-healthy fats.  · Limit cheese to 1 oz (28 g) per day.  · Eat more home-cooked food and less restaurant, buffet, and fast food.  · Limit fried foods.  · Cook foods using methods other than frying.  · Limit canned vegetables. If you do use them, rinse them well to decrease the sodium.  · When eating at a restaurant, ask that your food be prepared with less salt, or no salt if possible.  WHAT FOODS CAN I EAT?  Seek help from a dietitian for individual calorie needs.  Grains  Whole grain or whole wheat bread. Brown rice. Whole grain or whole wheat pasta. Quinoa, bulgur, and whole grain cereals. Low-sodium cereals. Corn or whole wheat flour tortillas. Whole grain cornbread. Whole grain crackers. Low-sodium crackers.  Vegetables  Fresh or frozen vegetables  (raw, steamed, roasted, or grilled). Low-sodium or reduced-sodium tomato and vegetable juices. Low-sodium or reduced-sodium tomato sauce and paste. Low-sodium or reduced-sodium canned vegetables.   Fruits  All fresh, canned (in natural juice), or frozen fruits.  Meat and Other Protein Products  Ground beef (85% or leaner), grass-fed beef, or beef trimmed of fat. Skinless chicken or turkey. Ground chicken or turkey. Pork trimmed of fat. All fish and seafood. Eggs. Dried beans, peas, or lentils. Unsalted nuts and seeds. Unsalted canned beans.  Dairy  Low-fat dairy products, such as skim or 1% milk, 2% or reduced-fat cheeses, low-fat ricotta or cottage cheese, or plain low-fat yogurt. Low-sodium or reduced-sodium cheeses.  Fats and Oils  Tub margarines without trans fats. Light or reduced-fat mayonnaise and salad dressings (reduced sodium). Avocado. Safflower, olive, or canola oils. Natural peanut or almond butter.  Other  Unsalted popcorn and pretzels.  The items listed above may not be a complete list of recommended foods or beverages. Contact your dietitian for more options.  WHAT FOODS ARE NOT RECOMMENDED?  Grains  White bread. White pasta. White rice. Refined cornbread. Bagels and croissants. Crackers that contain trans fat.  Vegetables  Creamed or fried vegetables. Vegetables in a cheese sauce. Regular canned vegetables. Regular canned tomato sauce and paste. Regular tomato and vegetable juices.  Fruits  Dried fruits. Canned fruit in light or heavy syrup. Fruit juice.  Meat and Other Protein   Products  Fatty cuts of meat. Ribs, chicken wings, bacon, sausage, bologna, salami, chitterlings, fatback, hot dogs, bratwurst, and packaged luncheon meats. Salted nuts and seeds. Canned beans with salt.  Dairy  Whole or 2% milk, cream, half-and-half, and cream cheese. Whole-fat or sweetened yogurt. Full-fat cheeses or blue cheese. Nondairy creamers and whipped toppings. Processed cheese, cheese spreads, or cheese  curds.  Condiments  Onion and garlic salt, seasoned salt, table salt, and sea salt. Canned and packaged gravies. Worcestershire sauce. Tartar sauce. Barbecue sauce. Teriyaki sauce. Soy sauce, including reduced sodium. Steak sauce. Fish sauce. Oyster sauce. Cocktail sauce. Horseradish. Ketchup and mustard. Meat flavorings and tenderizers. Bouillon cubes. Hot sauce. Tabasco sauce. Marinades. Taco seasonings. Relishes.  Fats and Oils  Butter, stick margarine, lard, shortening, ghee, and bacon fat. Coconut, palm kernel, or palm oils. Regular salad dressings.  Other  Pickles and olives. Salted popcorn and pretzels.  The items listed above may not be a complete list of foods and beverages to avoid. Contact your dietitian for more information.  WHERE CAN I FIND MORE INFORMATION?  National Heart, Lung, and [employer] Institute: www.nhlbi.nih.gov/health/health-topics/topics/dash/     This information is not intended to replace advice given to you by your health care provider. Make sure you discuss any questions you have with your health care provider.     Document Released: 07/25/2011 Document Revised: 08/26/2014 Document Reviewed: 06/09/2013  Elsevier Interactive Patient Education ©2016 [employer] Inc.

## 2015-07-19 LAB — CMP14+EGFR
A/G RATIO: 2 (ref 1.1–2.5)
ALK PHOS: 67 IU/L (ref 39–117)
ALT: 24 IU/L (ref 0–32)
AST: 28 IU/L (ref 0–40)
Albumin: 4.5 g/dL (ref 3.5–4.8)
BILIRUBIN TOTAL: 0.5 mg/dL (ref 0.0–1.2)
BUN/Creatinine Ratio: 18 (ref 11–26)
BUN: 11 mg/dL (ref 8–27)
CHLORIDE: 95 mmol/L — AB (ref 97–106)
CO2: 24 mmol/L (ref 18–29)
Calcium: 9.5 mg/dL (ref 8.7–10.3)
Creatinine, Ser: 0.61 mg/dL (ref 0.57–1.00)
GFR calc Af Amer: 104 mL/min/{1.73_m2} (ref >59)
GFR calc non Af Amer: 90 mL/min/{1.73_m2} (ref >59)
Globulin, Total: 2.3 g/dL (ref 1.5–4.5)
Glucose: 185 mg/dL — ABNORMAL HIGH (ref 65–99)
POTASSIUM: 4.6 mmol/L (ref 3.5–5.2)
Sodium: 138 mmol/L (ref 136–144)
Total Protein: 6.8 g/dL (ref 6.0–8.5)

## 2015-07-19 MED ORDER — ACYCLOVIR 5 % EX OINT: 1.0000 "application " | TOPICAL_OINTMENT | CUTANEOUS | Status: DC

## 2015-07-19 NOTE — Addendum Note (Signed)
Addended by: Claretta Fraise on: 07/19/2015 01:45 PM   Modules accepted: Orders

## 2015-07-28 ENCOUNTER — Encounter: Payer: Self-pay | Admitting: Internal Medicine

## 2015-08-15 ENCOUNTER — Other Ambulatory Visit: Payer: Self-pay | Admitting: Family Medicine

## 2015-08-15 MED ORDER — AMLODIPINE BESYLATE 10 MG PO TABS: 5.0000 mg | ORAL_TABLET | Freq: Every day | ORAL | Status: DC

## 2015-08-22 ENCOUNTER — Ambulatory Visit (INDEPENDENT_AMBULATORY_CARE_PROVIDER_SITE_OTHER): Payer: Medicare Other | Admitting: Family Medicine

## 2015-08-22 ENCOUNTER — Encounter: Payer: Self-pay | Admitting: Family Medicine

## 2015-08-22 VITALS — BP 123/62 | HR 78 | Temp 97.9°F | Ht 63.0 in | Wt 172.0 lb

## 2015-08-22 DIAGNOSIS — R600 Localized edema: Secondary | ICD-10-CM | POA: Diagnosis not present

## 2015-08-22 DIAGNOSIS — G4700 Insomnia, unspecified: Secondary | ICD-10-CM | POA: Diagnosis not present

## 2015-08-22 DIAGNOSIS — R609 Edema, unspecified: Secondary | ICD-10-CM | POA: Insufficient documentation

## 2015-08-22 DIAGNOSIS — E785 Hyperlipidemia, unspecified: Secondary | ICD-10-CM | POA: Diagnosis not present

## 2015-08-22 DIAGNOSIS — F4321 Adjustment disorder with depressed mood: Secondary | ICD-10-CM

## 2015-08-22 DIAGNOSIS — I10 Essential (primary) hypertension: Secondary | ICD-10-CM | POA: Diagnosis not present

## 2015-08-22 MED ORDER — AMLODIPINE BESYLATE 5 MG PO TABS: 5.0000 mg | ORAL_TABLET | Freq: Every day | ORAL | Status: DC

## 2015-08-22 NOTE — Patient Instructions (Signed)
DASH Eating Plan  DASH stands for "Dietary Approaches to Stop Hypertension." The DASH eating plan is a healthy eating plan that has been shown to reduce high blood pressure (hypertension). Additional health benefits may include reducing the risk of type 2 diabetes mellitus, heart disease, and stroke. The DASH eating plan may also help with weight loss.  WHAT DO I NEED TO KNOW ABOUT THE DASH EATING PLAN?  For the DASH eating plan, you will follow these general guidelines:  · Choose foods with a percent daily value for sodium of less than 5% (as listed on the food label).  · Use salt-free seasonings or herbs instead of table salt or sea salt.  · Check with your health care provider or pharmacist before using salt substitutes.  · Eat lower-sodium products, often labeled as "lower sodium" or "no salt added."  · Eat fresh foods.  · Eat more vegetables, fruits, and low-fat dairy products.  · Choose whole grains. Look for the word "whole" as the first word in the ingredient list.  · Choose fish and skinless chicken or turkey more often than red meat. Limit fish, poultry, and meat to 6 oz (170 g) each day.  · Limit sweets, desserts, sugars, and sugary drinks.  · Choose heart-healthy fats.  · Limit cheese to 1 oz (28 g) per day.  · Eat more home-cooked food and less restaurant, buffet, and fast food.  · Limit fried foods.  · Cook foods using methods other than frying.  · Limit canned vegetables. If you do use them, rinse them well to decrease the sodium.  · When eating at a restaurant, ask that your food be prepared with less salt, or no salt if possible.  WHAT FOODS CAN I EAT?  Seek help from a dietitian for individual calorie needs.  Grains  Whole grain or whole wheat bread. Brown rice. Whole grain or whole wheat pasta. Quinoa, bulgur, and whole grain cereals. Low-sodium cereals. Corn or whole wheat flour tortillas. Whole grain cornbread. Whole grain crackers. Low-sodium crackers.  Vegetables  Fresh or frozen vegetables  (raw, steamed, roasted, or grilled). Low-sodium or reduced-sodium tomato and vegetable juices. Low-sodium or reduced-sodium tomato sauce and paste. Low-sodium or reduced-sodium canned vegetables.   Fruits  All fresh, canned (in natural juice), or frozen fruits.  Meat and Other Protein Products  Ground beef (85% or leaner), grass-fed beef, or beef trimmed of fat. Skinless chicken or turkey. Ground chicken or turkey. Pork trimmed of fat. All fish and seafood. Eggs. Dried beans, peas, or lentils. Unsalted nuts and seeds. Unsalted canned beans.  Dairy  Low-fat dairy products, such as skim or 1% milk, 2% or reduced-fat cheeses, low-fat ricotta or cottage cheese, or plain low-fat yogurt. Low-sodium or reduced-sodium cheeses.  Fats and Oils  Tub margarines without trans fats. Light or reduced-fat mayonnaise and salad dressings (reduced sodium). Avocado. Safflower, olive, or canola oils. Natural peanut or almond butter.  Other  Unsalted popcorn and pretzels.  The items listed above may not be a complete list of recommended foods or beverages. Contact your dietitian for more options.  WHAT FOODS ARE NOT RECOMMENDED?  Grains  White bread. White pasta. White rice. Refined cornbread. Bagels and croissants. Crackers that contain trans fat.  Vegetables  Creamed or fried vegetables. Vegetables in a cheese sauce. Regular canned vegetables. Regular canned tomato sauce and paste. Regular tomato and vegetable juices.  Fruits  Dried fruits. Canned fruit in light or heavy syrup. Fruit juice.  Meat and Other Protein   Products  Fatty cuts of meat. Ribs, chicken wings, bacon, sausage, bologna, salami, chitterlings, fatback, hot dogs, bratwurst, and packaged luncheon meats. Salted nuts and seeds. Canned beans with salt.  Dairy  Whole or 2% milk, cream, half-and-half, and cream cheese. Whole-fat or sweetened yogurt. Full-fat cheeses or blue cheese. Nondairy creamers and whipped toppings. Processed cheese, cheese spreads, or cheese  curds.  Condiments  Onion and garlic salt, seasoned salt, table salt, and sea salt. Canned and packaged gravies. Worcestershire sauce. Tartar sauce. Barbecue sauce. Teriyaki sauce. Soy sauce, including reduced sodium. Steak sauce. Fish sauce. Oyster sauce. Cocktail sauce. Horseradish. Ketchup and mustard. Meat flavorings and tenderizers. Bouillon cubes. Hot sauce. Tabasco sauce. Marinades. Taco seasonings. Relishes.  Fats and Oils  Butter, stick margarine, lard, shortening, ghee, and bacon fat. Coconut, palm kernel, or palm oils. Regular salad dressings.  Other  Pickles and olives. Salted popcorn and pretzels.  The items listed above may not be a complete list of foods and beverages to avoid. Contact your dietitian for more information.  WHERE CAN I FIND MORE INFORMATION?  National Heart, Lung, and [employer] Institute: www.nhlbi.nih.gov/health/health-topics/topics/dash/     This information is not intended to replace advice given to you by your health care provider. Make sure you discuss any questions you have with your health care provider.     Document Released: 07/25/2011 Document Revised: 08/26/2014 Document Reviewed: 06/09/2013  Elsevier Interactive Patient Education ©2016 [employer] Inc.

## 2015-08-22 NOTE — Progress Notes (Signed)
Subjective:  Patient ID: Kristi Willis, female    DOB: Mar 14, 1941  Age: 75 y.o. MRN: [id number]  CC: Leg Swelling   HPI Kristi Willis presents for moderate swelling in the ankles. It was not associated with any shortness of breath or fatigue. She hasn't been taking the amlodipine 10 mg daily. She cut it back to 5 mg and the swelling went away. She is in today have her blood pressure rechecked.  Patient states that mirtazapine and made her feel loopy the first day she took the whole thing so she is been cutting it in half. Unfortunately she still having depression symptoms. She is sleeping fairly well. She only tried the whole pill once and that was the first night she take it.  History Kristi Willis has a past medical history of SVT (supraventricular tachycardia) ([employer]); Hypertension; Diabetes mellitus; Hypercholesteremia; Neuropathy ([place]); Asthma; GERD (gastroesophageal reflux disease); Cancer ([place]); Neuromuscular disorder ([place]); and Allergy.   She has past surgical history that includes Lumbar laminectomy (08/10); Spine surgery; Breast biopsy; and Skin cancer excision (2005).   Her family history includes Cancer in her father; Cancer (age of onset: 57) in her mother; Cancer (age of onset: 49) in her sister; Depression in her mother; Heart disease in her father; Heart disease (age of onset: 64) in her sister; Hypertension in her sister; Stroke in her father.She reports that she has never smoked. She has never used smokeless tobacco. She reports that she does not drink alcohol or use illicit drugs.  Outpatient Prescriptions Prior to Visit  Medication Sig Dispense Refill  . acyclovir ointment (ZOVIRAX) 5 % Apply 1 application topically every 3 (three) hours. Around mouth, Until symptoms clear 15 g 5  . albuterol (VENTOLIN HFA) 108 (90 BASE) MCG/ACT inhaler Inhale 2 puffs into the lungs every 6 (six) hours as needed for wheezing. 1 Inhaler 3  . azelastine (ASTELIN) 0.1 % nasal spray  Place 2 sprays into both nostrils 2 (two) times daily. 30 mL 11  . Calcium Carbonate-Vitamin D (CALTRATE 600+D) 600-400 MG-UNIT per tablet Take 1 tablet by mouth 2 (two) times daily.    . Cholecalciferol (VITAMIN D3) 1000 UNITS CAPS Take by mouth daily.      Marland Kitchen EPIPEN 2-PAK 0.3 MG/0.3ML SOAJ injection     . lisinopril (PRINIVIL,ZESTRIL) 40 MG tablet Take 1 tablet (40 mg total) by mouth daily. TAKE 1 TABLET DAILY 30 tablet 5  . loratadine (CLARITIN) 10 MG tablet Take 1 tablet (10 mg total) by mouth daily. 30 tablet 11  . metFORMIN (GLUCOPHAGE-XR) 500 MG 24 hr tablet Take 2 tablets (1,000 mg total) by mouth 2 (two) times daily. 120 tablet 2  . metoprolol (LOPRESSOR) 100 MG tablet Take 1 tablet (100 mg total) by mouth 2 (two) times daily. 180 tablet 3  . mirtazapine (REMERON) 30 MG tablet Take 1 tablet (30 mg total) by mouth at bedtime. 30 tablet 5  . montelukast (SINGULAIR) 10 MG tablet Take 1 tablet (10 mg total) by mouth at bedtime. 30 tablet 5  . Multiple Vitamin (MULTIVITAMIN) tablet Take 1 tablet by mouth daily.      . Pyridoxine HCl (VITAMIN B-6 PO) Take 1 tablet by mouth 2 (two) times daily.    . rosuvastatin (CRESTOR) 10 MG tablet Take 1 tablet (10 mg total) by mouth daily. 90 tablet 3  . sitaGLIPtin (JANUVIA) 100 MG tablet Take 1 tablet (100 mg total) by mouth daily. 30 tablet 5  . terazosin (HYTRIN) 2 MG capsule TAKE 1  CAPSULE AT BEDTIME 30 capsule 3  . vitamin B-12 (CYANOCOBALAMIN) 500 MCG tablet Take 500 mcg by mouth daily.      Marland Kitchen amLODipine (NORVASC) 10 MG tablet Take 0.5 tablets (5 mg total) by mouth daily. 30 tablet 0   No facility-administered medications prior to visit.    ROS Review of Systems  Objective:  BP 123/62 mmHg  Pulse 78  Temp(Src) 97.9 F (36.6 C) (Oral)  Ht _0  (1.6 m)  Wt 172 lb (78.019 kg)  BMI 30.48 kg/m2  BP Readings from Last 3 Encounters:  08/22/15 123/62  07/18/15 149/52  04/14/15 154/59    Wt Readings from Last 3 Encounters:  08/22/15 172  lb (78.019 kg)  07/18/15 165 lb (74.844 kg)  04/14/15 163 lb 3.2 oz (74.027 kg)     Physical Exam   Lab Results  Component Value Date   WBC 7.9 04/12/2015   HGB 12.9 01/12/2015   HCT 38.7 04/12/2015   PLT 257 06/11/2012   GLUCOSE 185* 07/18/2015   CHOL 174 04/12/2015   TRIG 174* 04/12/2015   HDL 50 04/12/2015   LDLCALC 89 04/12/2015   ALT 24 07/18/2015   AST 28 07/18/2015   NA 138 07/18/2015   K 4.6 07/18/2015   CL 95* 07/18/2015   CREATININE 0.61 07/18/2015   BUN 11 07/18/2015   CO2 24 07/18/2015   TSH 3.340 01/12/2015   INR 1.0 04/11/2009   HGBA1C 7.9 07/18/2015   MICROALBUR 1.45 11/25/2012    Mm Digital Diagnostic Unilat L  02/09/2015  CLINICAL DATA:  Status post left 630 o'clock breast core needle biopsy. EXAM: DIAGNOSTIC LEFT MAMMOGRAM POST ULTRASOUND BIOPSY COMPARISON:  Previous exam(s). FINDINGS: Mammographic images were obtained following ultrasound guided biopsy of the left 630 o'clock breast area of shadowing. Coil shaped metallic marker is seen within the 630 o'clock left breast, middle depth. IMPRESSION: Successful post biopsy metallic marker placement. Final Assessment: Post Procedure Mammograms for Marker Placement Electronically Signed   By: Fidela Salisbury M.D.   On: 02/09/2015 16:58   [place] Lt Breast Bx W Loc Dev 1st Lesion Img Bx Spec US Guide  02/13/2015  ADDENDUM REPORT: 02/13/2015 12:52 ADDENDUM: Pathology reveals Left breast fibrocystic changes with focal sclerosing adenosis and associated microcalcification. This was found to be concordant by Dr. Fidela Salisbury. Pathology results were discussed with the patient via telephone. The patient reported tenderness at the biopsy site. Post biopsy instructions were reviewed and questions were answered. The patient was encouraged to call [facility] with any additional questions and or concerns. The patient was instructed to return in 6 months for a follow up mammogram and ultrasound  of the Left breast. The patient was informed a reminder notice would be sent regarding this appointment. Pathology results reported by Terie Purser RN on February 13, 2015. Electronically Signed   By: Fidela Salisbury M.D.   On: 02/13/2015 12:52   02/13/2015  CLINICAL DATA:  Left breast 630 o'clock ill defined area of shadowing, seen on most recent diagnostic ultrasound. EXAM: ULTRASOUND GUIDED LEFT BREAST CORE NEEDLE BIOPSY COMPARISON:  01/11/2015, 02/01/2015 FINDINGS: I met with the patient and we discussed the procedure of ultrasound-guided biopsy, including benefits and alternatives. We discussed the high likelihood of a successful procedure. We discussed the risks of the procedure, including infection, bleeding, tissue injury, clip migration, and inadequate sampling. Informed written consent was given. The usual time-out protocol was performed immediately prior to the procedure. Using sterile technique and 2%  Lidocaine as local anesthetic, under direct ultrasound visualization, a 12 gauge spring-loaded device was used to perform biopsy of left breast using a lateral approach. At the conclusion of the procedure a coil shaped tissue marker clip was deployed into the biopsy cavity. Follow up 2 view mammogram was performed and dictated separately. IMPRESSION: Ultrasound guided biopsy of left breast.  No apparent complications. Electronically Signed: By: Fidela Salisbury M.D. On: 02/09/2015 16:43    Assessment & Plan:   Kristi Willis was seen today for leg swelling.  Diagnoses and all orders for this visit:  Essential hypertension -     CMP14+EGFR; Future -     CBC with Differential/Platelet; Future  Adjustment disorder with depressed mood -     CMP14+EGFR; Future -     CBC with Differential/Platelet; Future  Insomnia -     CMP14+EGFR; Future -     CBC with Differential/Platelet; Future  Localized edema -     CMP14+EGFR; Future -     CBC with Differential/Platelet; Future  Hyperlipidemia -      CMP14+EGFR; Future -     Lipid panel; Future -     CBC with Differential/Platelet; Future  Other orders -     amLODipine (NORVASC) 5 MG tablet; Take 1 tablet (5 mg total) by mouth daily.   I have changed Kristi Willis's amLODipine. I am also having her maintain her vitamin B-12, multivitamin, Vitamin D3, Calcium Carbonate-Vitamin D, Pyridoxine HCl (VITAMIN B-6 PO), terazosin, EPIPEN 2-PAK, azelastine, loratadine, metoprolol, lisinopril, montelukast, rosuvastatin, albuterol, metFORMIN, mirtazapine, sitaGLIPtin, and acyclovir ointment.  Meds ordered this encounter  Medications  . amLODipine (NORVASC) 5 MG tablet    Sig: Take 1 tablet (5 mg total) by mouth daily.    Dispense:  90 tablet    Refill:  3    Increase the mirtazapine to 30 mg at bedtime as an entire pill rather than the half. We discussed potential side effects and the fact that after having taken the half dose for a month cold though should be well tolerated. She'll just give it a few days to adjust.   Follow-up: Return in about 6 weeks (around 10/03/2015).  Claretta Fraise, M.D.

## 2015-09-04 ENCOUNTER — Ambulatory Visit (INDEPENDENT_AMBULATORY_CARE_PROVIDER_SITE_OTHER): Payer: Medicare Other

## 2015-09-04 ENCOUNTER — Encounter: Payer: Self-pay | Admitting: Family Medicine

## 2015-09-04 ENCOUNTER — Ambulatory Visit (INDEPENDENT_AMBULATORY_CARE_PROVIDER_SITE_OTHER): Payer: Medicare Other | Admitting: Family Medicine

## 2015-09-04 VITALS — BP 147/68 | HR 76 | Temp 98.4°F | Ht 63.0 in | Wt 170.0 lb

## 2015-09-04 DIAGNOSIS — M25572 Pain in left ankle and joints of left foot: Secondary | ICD-10-CM | POA: Diagnosis not present

## 2015-09-04 MED ORDER — BETAMETHASONE SOD PHOS & ACET 6 (3-3) MG/ML IJ SUSP
6.0000 mg | Freq: Once | INTRAMUSCULAR | Status: AC
  Administered 2015-09-04: 6 mg via INTRAMUSCULAR

## 2015-09-04 NOTE — Progress Notes (Signed)
Subjective:  Patient ID: Kristi Willis, female    DOB: 19-Jun-1941  Age: 75 y.o. MRN: [id number]  CC: Ankle Pain   HPI Kristi Willis presents for increasing left ankle pain. It is about a 8-9/10 when she is ambulating. However she does walk on it some. Additionally the pain seems to radiate into the left buttocks region.  History Kristi Willis has a past medical history of SVT (supraventricular tachycardia) ([place]); Hypertension; Diabetes mellitus; Hypercholesteremia; Neuropathy ([place]); Asthma; GERD (gastroesophageal reflux disease); Cancer ([place]); Neuromuscular disorder ([place]); and Allergy.   She has past surgical history that includes Lumbar laminectomy (08/10); Spine surgery; Breast biopsy; and Skin cancer excision (2005).   Her family history includes Cancer in her father; Cancer (age of onset: 31) in her mother; Cancer (age of onset: 75) in her sister; Depression in her mother; Heart disease in her father; Heart disease (age of onset: 9) in her sister; Hypertension in her sister; Stroke in her father.She reports that she has never smoked. She has never used smokeless tobacco. She reports that she does not drink alcohol or use illicit drugs.  Outpatient Prescriptions Prior to Visit  Medication Sig Dispense Refill  . acyclovir ointment (ZOVIRAX) 5 % Apply 1 application topically every 3 (three) hours. Around mouth, Until symptoms clear 15 g 5  . albuterol (VENTOLIN HFA) 108 (90 BASE) MCG/ACT inhaler Inhale 2 puffs into the lungs every 6 (six) hours as needed for wheezing. 1 Inhaler 3  . azelastine (ASTELIN) 0.1 % nasal spray Place 2 sprays into both nostrils 2 (two) times daily. 30 mL 11  . Calcium Carbonate-Vitamin D (CALTRATE 600+D) 600-400 MG-UNIT per tablet Take 1 tablet by mouth 2 (two) times daily.    . Cholecalciferol (VITAMIN D3) 1000 UNITS CAPS Take by mouth daily.      Marland Kitchen EPIPEN 2-PAK 0.3 MG/0.3ML SOAJ injection     . lisinopril (PRINIVIL,ZESTRIL) 40 MG tablet Take 1 tablet  (40 mg total) by mouth daily. TAKE 1 TABLET DAILY 30 tablet 5  . loratadine (CLARITIN) 10 MG tablet Take 1 tablet (10 mg total) by mouth daily. 30 tablet 11  . metFORMIN (GLUCOPHAGE-XR) 500 MG 24 hr tablet Take 2 tablets (1,000 mg total) by mouth 2 (two) times daily. 120 tablet 2  . metoprolol (LOPRESSOR) 100 MG tablet Take 1 tablet (100 mg total) by mouth 2 (two) times daily. 180 tablet 3  . mirtazapine (REMERON) 30 MG tablet Take 1 tablet (30 mg total) by mouth at bedtime. 30 tablet 5  . montelukast (SINGULAIR) 10 MG tablet Take 1 tablet (10 mg total) by mouth at bedtime. 30 tablet 5  . Multiple Vitamin (MULTIVITAMIN) tablet Take 1 tablet by mouth daily.      . Pyridoxine HCl (VITAMIN B-6 PO) Take 1 tablet by mouth 2 (two) times daily.    . rosuvastatin (CRESTOR) 10 MG tablet Take 1 tablet (10 mg total) by mouth daily. 90 tablet 3  . sitaGLIPtin (JANUVIA) 100 MG tablet Take 1 tablet (100 mg total) by mouth daily. 30 tablet 5  . terazosin (HYTRIN) 2 MG capsule TAKE 1 CAPSULE AT BEDTIME 30 capsule 3  . vitamin B-12 (CYANOCOBALAMIN) 500 MCG tablet Take 500 mcg by mouth daily.      Marland Kitchen amLODipine (NORVASC) 5 MG tablet Take 1 tablet (5 mg total) by mouth daily. 90 tablet 3   No facility-administered medications prior to visit.    ROS Review of Systems  Constitutional: Negative for fever, activity change and appetite change.  HENT: Negative for congestion, rhinorrhea and sore throat.   Eyes: Negative for visual disturbance.  Respiratory: Negative for cough and shortness of breath.   Cardiovascular: Negative for chest pain and palpitations.  Gastrointestinal: Negative for nausea, abdominal pain and diarrhea.  Genitourinary: Negative for dysuria.  Musculoskeletal: Negative for myalgias and arthralgias.    Objective:  BP 147/68 mmHg  Pulse 76  Temp(Src) 98.4 F (36.9 C) (Oral)  Ht '5\' 3"'  (1.6 m)  Wt 170 lb (77.111 kg)  BMI 30.12 kg/m2  BP Readings from Last 3 Encounters:  09/04/15 147/68   08/22/15 123/62  07/18/15 149/52    Wt Readings from Last 3 Encounters:  09/04/15 170 lb (77.111 kg)  08/22/15 172 lb (78.019 kg)  07/18/15 165 lb (74.844 kg)     Physical Exam  Constitutional: She is oriented to person, place, and time. She appears well-developed and well-nourished. No distress.  HENT:  Head: Normocephalic and atraumatic.  Right Ear: External ear normal.  Left Ear: External ear normal.  Nose: Nose normal.  Mouth/Throat: Oropharynx is clear and moist.  Eyes: Conjunctivae and EOM are normal. Pupils are equal, round, and reactive to light.  Neck: Normal range of motion. Neck supple. No thyromegaly present.  Cardiovascular: Normal rate, regular rhythm and normal heart sounds.   No murmur heard. Pulmonary/Chest: Effort normal and breath sounds normal. No respiratory distress. She has no wheezes. She has no rales.  Abdominal: Soft. Bowel sounds are normal. She exhibits no distension. There is no tenderness.  Lymphadenopathy:    She has no cervical adenopathy.  Neurological: She is alert and oriented to person, place, and time. She has normal reflexes.  Skin: Skin is warm and dry.  Psychiatric: She has a normal mood and affect. Her behavior is normal. Judgment and thought content normal.     Lab Results  Component Value Date   WBC 7.9 04/12/2015   HGB 12.9 01/12/2015   HCT 38.7 04/12/2015   PLT 222 04/12/2015   GLUCOSE 185* 07/18/2015   CHOL 174 04/12/2015   TRIG 174* 04/12/2015   HDL 50 04/12/2015   LDLCALC 89 04/12/2015   ALT 24 07/18/2015   AST 28 07/18/2015   NA 138 07/18/2015   K 4.6 07/18/2015   CL 95* 07/18/2015   CREATININE 0.61 07/18/2015   BUN 11 07/18/2015   CO2 24 07/18/2015   TSH 3.340 01/12/2015   INR 1.0 04/11/2009   HGBA1C 7.9 07/18/2015   MICROALBUR 1.45 11/25/2012    Mm Digital Diagnostic Unilat L  02/09/2015  CLINICAL DATA:  Status post left 630 o'clock breast core needle biopsy. EXAM: DIAGNOSTIC LEFT MAMMOGRAM POST ULTRASOUND  BIOPSY COMPARISON:  Previous exam(s). FINDINGS: Mammographic images were obtained following ultrasound guided biopsy of the left 630 o'clock breast area of shadowing. Coil shaped metallic marker is seen within the 630 o'clock left breast, middle depth. IMPRESSION: Successful post biopsy metallic marker placement. Final Assessment: Post Procedure Mammograms for Marker Placement Electronically Signed   By: Fidela Salisbury M.D.   On: 02/09/2015 16:58   [place] Lt Breast Bx W Loc Dev 1st Lesion Img Bx Spec US Guide  02/13/2015  ADDENDUM REPORT: 02/13/2015 12:52 ADDENDUM: Pathology reveals Left breast fibrocystic changes with focal sclerosing adenosis and associated microcalcification. This was found to be concordant by Dr. Fidela Salisbury. Pathology results were discussed with the patient via telephone. The patient reported tenderness at the biopsy site. Post biopsy instructions were reviewed and questions were answered. The patient was encouraged to call  [facility] with any additional questions and or concerns. The patient was instructed to return in 6 months for a follow up mammogram and ultrasound of the Left breast. The patient was informed a reminder notice would be sent regarding this appointment. Pathology results reported by Terie Purser RN on February 13, 2015. Electronically Signed   By: Fidela Salisbury M.D.   On: 02/13/2015 12:52   02/13/2015  CLINICAL DATA:  Left breast 630 o'clock ill defined area of shadowing, seen on most recent diagnostic ultrasound. EXAM: ULTRASOUND GUIDED LEFT BREAST CORE NEEDLE BIOPSY COMPARISON:  01/11/2015, 02/01/2015 FINDINGS: I met with the patient and we discussed the procedure of ultrasound-guided biopsy, including benefits and alternatives. We discussed the high likelihood of a successful procedure. We discussed the risks of the procedure, including infection, bleeding, tissue injury, clip migration, and inadequate sampling. Informed written  consent was given. The usual time-out protocol was performed immediately prior to the procedure. Using sterile technique and 2% Lidocaine as local anesthetic, under direct ultrasound visualization, a 12 gauge spring-loaded device was used to perform biopsy of left breast using a lateral approach. At the conclusion of the procedure a coil shaped tissue marker clip was deployed into the biopsy cavity. Follow up 2 view mammogram was performed and dictated separately. IMPRESSION: Ultrasound guided biopsy of left breast.  No apparent complications. Electronically Signed: By: Fidela Salisbury M.D. On: 02/09/2015 16:43    Assessment & Plan:   Kristi Willis was seen today for ankle pain.  Diagnoses and all orders for this visit:  Pain in joint, ankle and foot, left -     DG Ankle Complete Left; Future -     betamethasone acetate-betamethasone sodium phosphate (CELESTONE) injection 6 mg; Inject 1 mL (6 mg total) into the muscle once. -     DME Other see comment   I am having Kristi Willis maintain her vitamin B-12, multivitamin, Vitamin D3, Calcium Carbonate-Vitamin D, Pyridoxine HCl (VITAMIN B-6 PO), terazosin, EPIPEN 2-PAK, azelastine, loratadine, metoprolol, lisinopril, montelukast, rosuvastatin, albuterol, metFORMIN, mirtazapine, sitaGLIPtin, acyclovir ointment, ketoconazole, and amLODipine. We administered betamethasone acetate-betamethasone sodium phosphate.  Meds ordered this encounter  Medications  . ketoconazole (NIZORAL) 2 % cream    Sig:   . amLODipine (NORVASC) 10 MG tablet    Sig:   . betamethasone acetate-betamethasone sodium phosphate (CELESTONE) injection 6 mg    Sig:      Follow-up: Return if symptoms worsen or fail to improve.  Claretta Fraise, M.D.

## 2015-10-03 ENCOUNTER — Telehealth: Payer: Self-pay | Admitting: Family Medicine

## 2015-10-03 NOTE — Telephone Encounter (Signed)
Her daughter doesn't want to bring her in here with everyone sick if at all possible.

## 2015-10-03 NOTE — Telephone Encounter (Signed)
Please set up an appt. For me to see Kristi Willis to work on her apparent depression. Recommend that her daughter come with her.

## 2015-10-03 NOTE — Telephone Encounter (Signed)
Talked to [name] (pt's daughter) she states that pt is going through a lot and is crying constantly and wants to speak with Dr.Stacks about her mothers medications.

## 2015-10-05 NOTE — Telephone Encounter (Signed)
Patients daughter states that she stopped taking the trazadone due to having bad dreams. Her daughter does not want to bring her in if possible with all the sickness right now and she is requesting to speak with you.

## 2015-10-06 ENCOUNTER — Other Ambulatory Visit: Payer: Self-pay | Admitting: Nurse Practitioner

## 2015-10-06 DIAGNOSIS — N6320 Unspecified lump in the left breast, unspecified quadrant: Secondary | ICD-10-CM

## 2015-10-06 NOTE — Telephone Encounter (Signed)
I am happy to see her for this. It is far to complicated to care for by phone. WS

## 2015-10-09 ENCOUNTER — Encounter: Payer: Self-pay | Admitting: Family Medicine

## 2015-10-09 ENCOUNTER — Ambulatory Visit (INDEPENDENT_AMBULATORY_CARE_PROVIDER_SITE_OTHER): Payer: Medicare Other | Admitting: Family Medicine

## 2015-10-09 VITALS — BP 149/65 | HR 71 | Temp 97.7°F | Ht 63.0 in | Wt 165.4 lb

## 2015-10-09 DIAGNOSIS — G4700 Insomnia, unspecified: Secondary | ICD-10-CM | POA: Diagnosis not present

## 2015-10-09 DIAGNOSIS — E119 Type 2 diabetes mellitus without complications: Secondary | ICD-10-CM | POA: Diagnosis not present

## 2015-10-09 DIAGNOSIS — F329 Major depressive disorder, single episode, unspecified: Secondary | ICD-10-CM

## 2015-10-09 DIAGNOSIS — I10 Essential (primary) hypertension: Secondary | ICD-10-CM | POA: Diagnosis not present

## 2015-10-09 DIAGNOSIS — F32A Depression, unspecified: Secondary | ICD-10-CM

## 2015-10-09 MED ORDER — MIRTAZAPINE 7.5 MG PO TABS: 7.5000 mg | ORAL_TABLET | Freq: Every day | ORAL | Status: DC

## 2015-10-09 NOTE — Progress Notes (Signed)
Subjective:  Patient ID: Kristi Willis, female    DOB: 1941/04/03  Age: 75 y.o. MRN: [id number]  CC: Depression   HPI Kristi Willis presents for not sleeping   History Kristi Willis has a past medical history of SVT (supraventricular tachycardia) ([place]); Hypertension; Diabetes mellitus; Hypercholesteremia; Neuropathy ([place]); Asthma; GERD (gastroesophageal reflux disease); Cancer ([place]); Neuromuscular disorder ([place]); and Allergy.   She has past surgical history that includes Lumbar laminectomy (08/10); Spine surgery; Breast biopsy; and Skin cancer excision (2005).   Her family history includes Cancer in her father; Cancer (age of onset: 51) in her mother; Cancer (age of onset: 67) in her sister; Depression in her mother; Heart disease in her father; Heart disease (age of onset: 28) in her sister; Hypertension in her sister; Stroke in her father.She reports that she has never smoked. She has never used smokeless tobacco. She reports that she does not drink alcohol or use illicit drugs.    ROS Review of Systems  Objective:  BP 149/65 mmHg  Pulse 71  Temp(Src) 97.7 F (36.5 C) (Oral)  Ht _0  (1.6 m)  Wt 165 lb 6.4 oz (75.025 kg)  BMI 29.31 kg/m2  SpO2 99%  BP Readings from Last 3 Encounters:  10/09/15 149/65  09/04/15 147/68  08/22/15 123/62    Wt Readings from Last 3 Encounters:  10/09/15 165 lb 6.4 oz (75.025 kg)  09/04/15 170 lb (77.111 kg)  08/22/15 172 lb (78.019 kg)     Physical Exam   Lab Results  Component Value Date   WBC 7.9 04/12/2015   HGB 12.9 01/12/2015   HCT 38.7 04/12/2015   PLT 222 04/12/2015   GLUCOSE 185* 07/18/2015   CHOL 174 04/12/2015   TRIG 174* 04/12/2015   HDL 50 04/12/2015   LDLCALC 89 04/12/2015   ALT 24 07/18/2015   AST 28 07/18/2015   NA 138 07/18/2015   K 4.6 07/18/2015   CL 95* 07/18/2015   CREATININE 0.61 07/18/2015   BUN 11 07/18/2015   CO2 24 07/18/2015   TSH 3.340 01/12/2015   INR 1.0 04/11/2009   HGBA1C 7.9  07/18/2015   MICROALBUR 1.45 11/25/2012    Mm Digital Diagnostic Unilat L  02/09/2015  CLINICAL DATA:  Status post left 630 o'clock breast core needle biopsy. EXAM: DIAGNOSTIC LEFT MAMMOGRAM POST ULTRASOUND BIOPSY COMPARISON:  Previous exam(s). FINDINGS: Mammographic images were obtained following ultrasound guided biopsy of the left 630 o'clock breast area of shadowing. Coil shaped metallic marker is seen within the 630 o'clock left breast, middle depth. IMPRESSION: Successful post biopsy metallic marker placement. Final Assessment: Post Procedure Mammograms for Marker Placement Electronically Signed   By: Fidela Salisbury M.D.   On: 02/09/2015 16:58   [place] Lt Breast Bx W Loc Dev 1st Lesion Img Bx Spec US Guide  02/13/2015  ADDENDUM REPORT: 02/13/2015 12:52 ADDENDUM: Pathology reveals Left breast fibrocystic changes with focal sclerosing adenosis and associated microcalcification. This was found to be concordant by Dr. Fidela Salisbury. Pathology results were discussed with the patient via telephone. The patient reported tenderness at the biopsy site. Post biopsy instructions were reviewed and questions were answered. The patient was encouraged to call [facility] with any additional questions and or concerns. The patient was instructed to return in 6 months for a follow up mammogram and ultrasound of the Left breast. The patient was informed a reminder notice would be sent regarding this appointment. Pathology results reported by Terie Purser RN on February 13, 2015. Electronically Signed   By: Fidela Salisbury M.D.   On: 02/13/2015 12:52   02/13/2015  CLINICAL DATA:  Left breast 630 o'clock ill defined area of shadowing, seen on most recent diagnostic ultrasound. EXAM: ULTRASOUND GUIDED LEFT BREAST CORE NEEDLE BIOPSY COMPARISON:  01/11/2015, 02/01/2015 FINDINGS: I met with the patient and we discussed the procedure of ultrasound-guided biopsy, including benefits and  alternatives. We discussed the high likelihood of a successful procedure. We discussed the risks of the procedure, including infection, bleeding, tissue injury, clip migration, and inadequate sampling. Informed written consent was given. The usual time-out protocol was performed immediately prior to the procedure. Using sterile technique and 2% Lidocaine as local anesthetic, under direct ultrasound visualization, a 12 gauge spring-loaded device was used to perform biopsy of left breast using a lateral approach. At the conclusion of the procedure a coil shaped tissue marker clip was deployed into the biopsy cavity. Follow up 2 view mammogram was performed and dictated separately. IMPRESSION: Ultrasound guided biopsy of left breast.  No apparent complications. Electronically Signed: By: Fidela Salisbury M.D. On: 02/09/2015 16:43    Assessment & Plan:   There are no diagnoses linked to this encounter.    I have discontinued Kristi Willis's mirtazapine, sitaGLIPtin, and ketoconazole. I am also having her maintain her vitamin B-12, multivitamin, Vitamin D3, Calcium Carbonate-Vitamin D, Pyridoxine HCl (VITAMIN B-6 PO), terazosin, EPIPEN 2-PAK, azelastine, loratadine, metoprolol, lisinopril, montelukast, rosuvastatin, albuterol, metFORMIN, acyclovir ointment, and amLODipine.  No orders of the defined types were placed in this encounter.     Follow-up: No Follow-up on file.  Claretta Fraise, M.D.

## 2015-10-09 NOTE — Progress Notes (Addendum)
Subjective:  Patient ID: Kristi Willis, female    DOB: [birth year]  Age: 75 y.o. MRN: [id number]  CC: Depression   HPI KENNITA PAVLOVICH presents for sleep disruption. Sleep med caused nightmares. Crying a lot. Used to take elavil . Needs something to calm her down. Was afraid of elavil due to concern that it caused dementia. Husband died. Having to take a woman to court over unpaid amounts. Grandchildren are going wild. Sad. Overwhelmed. Husband died 49 mos ago.  Used to take med & see a counselor many years ago.    No monitoring glucose due to distraught feelings. Not focused very well.  Depression screen [facility] 2/9 10/09/2015 04/14/2015 12/22/2014 10/11/2013  Decreased Interest 1 1 0 0  Down, Depressed, Hopeless _0 0  PHQ - 2 Score _1 0  Altered sleeping 3 2 - -  Tired, decreased energy 1 1 - -  Change in appetite 1 0 - -  Feeling bad or failure about yourself  0 0 - -  Trouble concentrating 1 0 - -  Moving slowly or fidgety/restless 0 1 - -  Suicidal thoughts 0 0 - -  PHQ-9 Score 8 6 - -  Difficult doing work/chores Somewhat difficult - - -    GAD 7 : Generalized Anxiety Score 10/09/2015  Nervous, Anxious, on Edge 1  Control/stop worrying 2  Worry too much - different things 2  Trouble relaxing 1  Restless 1  Easily annoyed or irritable 3  Afraid - awful might happen 2  Total GAD 7 Score 12  Anxiety Difficulty Somewhat difficult        History Brittley has a past medical history of SVT (supraventricular tachycardia) ([place]); Hypertension; Diabetes mellitus; Hypercholesteremia; Neuropathy ([place]); Asthma; GERD (gastroesophageal reflux disease); Cancer ([place]); Neuromuscular disorder ([place]); and Allergy.   She has past surgical history that includes Lumbar laminectomy (08/10); Spine surgery; Breast biopsy; and Skin cancer excision (2005).   Her family history includes Cancer in her father; Cancer (age of onset: 41) in her mother; Cancer (age of onset: 25) in her  sister; Depression in her mother; Heart disease in her father; Heart disease (age of onset: 13) in her sister; Hypertension in her sister; Stroke in her father.She reports that she has never smoked. She has never used smokeless tobacco. She reports that she does not drink alcohol or use illicit drugs.    ROS Review of Systems  Constitutional: Positive for appetite change (diminished). Negative for fever and activity change.  HENT: Negative for congestion, rhinorrhea and sore throat.   Eyes: Negative for visual disturbance.  Respiratory: Negative for cough and shortness of breath.   Cardiovascular: Negative for chest pain and palpitations.  Gastrointestinal: Negative for nausea, abdominal pain and diarrhea.  Genitourinary: Negative for dysuria.  Musculoskeletal: Negative for myalgias and arthralgias.  Psychiatric/Behavioral: Positive for dysphoric mood, decreased concentration and agitation. Negative for suicidal ideas, hallucinations, behavioral problems and self-injury. The patient is nervous/anxious.     Objective:  BP 149/65 mmHg  Pulse 71  Temp(Src) 97.7 F (36.5 C) (Oral)  Ht 5' 3" (1.6 m)  Wt 165 lb 6.4 oz (75.025 kg)  BMI 29.31 kg/m2  SpO2 99%  BP Readings from Last 3 Encounters:  10/09/15 149/65  09/04/15 147/68  08/22/15 123/62    Wt Readings from Last 3 Encounters:  10/09/15 165 lb 6.4 oz (75.025 kg)  09/04/15 170 lb (77.111 kg)  08/22/15 172 lb (78.019 kg)     Physical  Exam  Constitutional: She is oriented to person, place, and time. She appears well-developed and well-nourished. No distress.  HENT:  Head: Normocephalic and atraumatic.  Eyes: Conjunctivae are normal. Pupils are equal, round, and reactive to light.  Neck: Normal range of motion. Neck supple. No thyromegaly present.  Cardiovascular: Normal rate, regular rhythm and normal heart sounds.   No murmur heard. Pulmonary/Chest: Effort normal and breath sounds normal. No respiratory distress. She has  no wheezes. She has no rales.  Abdominal: Soft. Bowel sounds are normal. She exhibits no distension. There is no tenderness.  Musculoskeletal: Normal range of motion.  Lymphadenopathy:    She has no cervical adenopathy.  Neurological: She is alert and oriented to person, place, and time.  Skin: Skin is warm and dry.  Psychiatric: Judgment and thought content normal. Her mood appears anxious. Her affect is labile. Her affect is not angry and not inappropriate. Her speech is delayed. She is agitated, slowed and withdrawn. She is not aggressive. Thought content is not paranoid and not delusional. Cognition and memory are normal. She exhibits a depressed mood.     Lab Results  Component Value Date   WBC 7.9 04/12/2015   HGB 12.9 01/12/2015   HCT 38.7 04/12/2015   PLT 222 04/12/2015   GLUCOSE 185* 07/18/2015   CHOL 174 04/12/2015   TRIG 174* 04/12/2015   HDL 50 04/12/2015   LDLCALC 89 04/12/2015   ALT 24 07/18/2015   AST 28 07/18/2015   NA 138 07/18/2015   K 4.6 07/18/2015   CL 95* 07/18/2015   CREATININE 0.61 07/18/2015   BUN 11 07/18/2015   CO2 24 07/18/2015   TSH 3.340 01/12/2015   INR 1.0 04/11/2009   HGBA1C 7.9 07/18/2015   MICROALBUR 1.45 11/25/2012    Mm Digital Diagnostic Unilat L  02/09/2015  CLINICAL DATA:  Status post left 630 o'clock breast core needle biopsy. EXAM: DIAGNOSTIC LEFT MAMMOGRAM POST ULTRASOUND BIOPSY COMPARISON:  Previous exam(s). FINDINGS: Mammographic images were obtained following ultrasound guided biopsy of the left 630 o'clock breast area of shadowing. Coil shaped metallic marker is seen within the 630 o'clock left breast, middle depth. IMPRESSION: Successful post biopsy metallic marker placement. Final Assessment: Post Procedure Mammograms for Marker Placement Electronically Signed   By: Fidela Salisbury M.D.   On: 02/09/2015 16:58   [place] Lt Breast Bx W Loc Dev 1st Lesion Img Bx Spec US Guide  02/13/2015  ADDENDUM REPORT: 02/13/2015 12:52 ADDENDUM:  Pathology reveals Left breast fibrocystic changes with focal sclerosing adenosis and associated microcalcification. This was found to be concordant by Dr. Fidela Salisbury. Pathology results were discussed with the patient via telephone. The patient reported tenderness at the biopsy site. Post biopsy instructions were reviewed and questions were answered. The patient was encouraged to call [facility] with any additional questions and or concerns. The patient was instructed to return in 6 months for a follow up mammogram and ultrasound of the Left breast. The patient was informed a reminder notice would be sent regarding this appointment. Pathology results reported by Terie Purser RN on February 13, 2015. Electronically Signed   By: Fidela Salisbury M.D.   On: 02/13/2015 12:52   02/13/2015  CLINICAL DATA:  Left breast 630 o'clock ill defined area of shadowing, seen on most recent diagnostic ultrasound. EXAM: ULTRASOUND GUIDED LEFT BREAST CORE NEEDLE BIOPSY COMPARISON:  01/11/2015, 02/01/2015 FINDINGS: I met with the patient and we discussed the procedure of ultrasound-guided biopsy, including benefits and  alternatives. We discussed the high likelihood of a successful procedure. We discussed the risks of the procedure, including infection, bleeding, tissue injury, clip migration, and inadequate sampling. Informed written consent was given. The usual time-out protocol was performed immediately prior to the procedure. Using sterile technique and 2% Lidocaine as local anesthetic, under direct ultrasound visualization, a 12 gauge spring-loaded device was used to perform biopsy of left breast using a lateral approach. At the conclusion of the procedure a coil shaped tissue marker clip was deployed into the biopsy cavity. Follow up 2 view mammogram was performed and dictated separately. IMPRESSION: Ultrasound guided biopsy of left breast.  No apparent complications. Electronically Signed: By:  Fidela Salisbury M.D. On: 02/09/2015 16:43    Assessment & Plan:   Makinze was seen today for depression.  Diagnoses and all orders for this visit:  Depression  Insomnia  Essential hypertension  Type 2 diabetes mellitus without complication, without long-term current use of insulin (HCC)  Other orders -     mirtazapine (REMERON) 7.5 MG tablet; Take 1 tablet (7.5 mg total) by mouth at bedtime.      I have discontinued Ms. Angelino's mirtazapine, sitaGLIPtin, and ketoconazole. I am also having her start on mirtazapine. Additionally, I am having her maintain her vitamin B-12, multivitamin, Vitamin D3, Calcium Carbonate-Vitamin D, Pyridoxine HCl (VITAMIN B-6 PO), terazosin, EPIPEN 2-PAK, azelastine, loratadine, metoprolol, lisinopril, montelukast, rosuvastatin, albuterol, metFORMIN, acyclovir ointment, and amLODipine.  Meds ordered this encounter  Medications  . mirtazapine (REMERON) 7.5 MG tablet    Sig: Take 1 tablet (7.5 mg total) by mouth at bedtime.    Dispense:  30 tablet    Refill:  0     Follow-up: Return in about 2 weeks (around 10/23/2015) for Depression, diabetes.  Claretta Fraise, M.D.

## 2015-10-12 ENCOUNTER — Ambulatory Visit
Admission: RE | Admit: 2015-10-12 | Discharge: 2015-10-12 | Disposition: A | Discharge status: Home / Self Care | Payer: Medicare Other | Source: Ambulatory Visit | Attending: Nurse Practitioner | Admitting: Nurse Practitioner

## 2015-10-12 DIAGNOSIS — N6320 Unspecified lump in the left breast, unspecified quadrant: Secondary | ICD-10-CM

## 2015-10-13 ENCOUNTER — Other Ambulatory Visit (INDEPENDENT_AMBULATORY_CARE_PROVIDER_SITE_OTHER): Payer: Medicare Other

## 2015-10-13 DIAGNOSIS — E559 Vitamin D deficiency, unspecified: Secondary | ICD-10-CM

## 2015-10-13 DIAGNOSIS — E785 Hyperlipidemia, unspecified: Secondary | ICD-10-CM

## 2015-10-13 DIAGNOSIS — E1342 Other specified diabetes mellitus with diabetic polyneuropathy: Secondary | ICD-10-CM

## 2015-10-13 DIAGNOSIS — R5383 Other fatigue: Secondary | ICD-10-CM

## 2015-10-13 DIAGNOSIS — I10 Essential (primary) hypertension: Secondary | ICD-10-CM

## 2015-10-13 LAB — POCT GLYCOSYLATED HEMOGLOBIN (HGB A1C): HEMOGLOBIN A1C: 8.9

## 2015-10-13 NOTE — Progress Notes (Signed)
Lab only 

## 2015-10-14 LAB — CBC WITH DIFFERENTIAL/PLATELET
BASOS ABS: 0 10*3/uL (ref 0.0–0.2)
Basos: 0 %
EOS (ABSOLUTE): 0.3 10*3/uL (ref 0.0–0.4)
EOS: 4 %
HEMATOCRIT: 37.4 % (ref 34.0–46.6)
Hemoglobin: 12.2 g/dL (ref 11.1–15.9)
IMMATURE GRANULOCYTES: 0 %
Immature Grans (Abs): 0 10*3/uL (ref 0.0–0.1)
LYMPHS ABS: 3 10*3/uL (ref 0.7–3.1)
Lymphs: 40 %
MCH: 28.4 pg (ref 26.6–33.0)
MCHC: 32.6 g/dL (ref 31.5–35.7)
MCV: 87 fL (ref 79–97)
MONOCYTES: 7 %
MONOS ABS: 0.6 10*3/uL (ref 0.1–0.9)
NEUTROS PCT: 49 %
Neutrophils Absolute: 3.7 10*3/uL (ref 1.4–7.0)
Platelets: 215 10*3/uL (ref 150–379)
RBC: 4.29 x10E6/uL (ref 3.77–5.28)
RDW: 14.1 % (ref 12.3–15.4)
WBC: 7.5 10*3/uL (ref 3.4–10.8)

## 2015-10-14 LAB — CMP14+EGFR
A/G RATIO: 2.1 (ref 1.1–2.5)
ALT: 35 IU/L — AB (ref 0–32)
AST: 31 IU/L (ref 0–40)
Albumin: 4.5 g/dL (ref 3.5–4.8)
Alkaline Phosphatase: 64 IU/L (ref 39–117)
BILIRUBIN TOTAL: 0.3 mg/dL (ref 0.0–1.2)
BUN/Creatinine Ratio: 19 (ref 11–26)
BUN: 11 mg/dL (ref 8–27)
CALCIUM: 9.4 mg/dL (ref 8.7–10.3)
CHLORIDE: 97 mmol/L (ref 96–106)
CO2: 23 mmol/L (ref 18–29)
Creatinine, Ser: 0.58 mg/dL (ref 0.57–1.00)
GFR calc Af Amer: 105 mL/min/{1.73_m2} (ref >59)
GFR, EST NON AFRICAN AMERICAN: 91 mL/min/{1.73_m2} (ref >59)
GLUCOSE: 228 mg/dL — AB (ref 65–99)
Globulin, Total: 2.1 g/dL (ref 1.5–4.5)
POTASSIUM: 4.1 mmol/L (ref 3.5–5.2)
Sodium: 140 mmol/L (ref 134–144)
Total Protein: 6.6 g/dL (ref 6.0–8.5)

## 2015-10-14 LAB — VITAMIN D 25 HYDROXY (VIT D DEFICIENCY, FRACTURES): Vit D, 25-Hydroxy: 39.3 ng/mL (ref 30.0–100.0)

## 2015-10-17 ENCOUNTER — Ambulatory Visit: Payer: Medicare Other | Admitting: Family Medicine

## 2015-10-23 ENCOUNTER — Encounter: Payer: Self-pay | Admitting: Family Medicine

## 2015-10-23 ENCOUNTER — Ambulatory Visit (INDEPENDENT_AMBULATORY_CARE_PROVIDER_SITE_OTHER): Payer: Medicare Other | Admitting: Family Medicine

## 2015-10-23 VITALS — BP 154/71 | HR 72 | Temp 97.8°F | Ht 63.0 in | Wt 167.4 lb

## 2015-10-23 DIAGNOSIS — E1165 Type 2 diabetes mellitus with hyperglycemia: Secondary | ICD-10-CM

## 2015-10-23 DIAGNOSIS — E785 Hyperlipidemia, unspecified: Secondary | ICD-10-CM | POA: Diagnosis not present

## 2015-10-23 DIAGNOSIS — I10 Essential (primary) hypertension: Secondary | ICD-10-CM | POA: Diagnosis not present

## 2015-10-23 MED ORDER — DOXAZOSIN MESYLATE 8 MG PO TABS: 8.0000 mg | ORAL_TABLET | Freq: Every day | ORAL | Status: DC

## 2015-10-23 MED ORDER — SITAGLIPTIN PHOSPHATE 100 MG PO TABS: 100.0000 mg | ORAL_TABLET | Freq: Every day | ORAL | Status: DC

## 2015-10-23 NOTE — Patient Instructions (Addendum)
Thank you for allowing [place] to care for you today. We strive to provide exceptional quality and compassionate care. Please let us know how we are doing and how we can help serve you better by filling out the survey that you receive from [facility].   DASH Eating Plan DASH stands for "Dietary Approaches to Stop Hypertension." The DASH eating plan is a healthy eating plan that has been shown to reduce high blood pressure (hypertension). Additional health benefits may include reducing the risk of type 2 diabetes mellitus, heart disease, and stroke. The DASH eating plan may also help with weight loss. WHAT DO I NEED TO KNOW ABOUT THE DASH EATING PLAN? For the DASH eating plan, you will follow these general guidelines:  Choose foods with a percent daily value for sodium of less than 5% (as listed on the food label).  Use salt-free seasonings or herbs instead of table salt or sea salt.  Check with your health care provider or pharmacist before using salt substitutes.  Eat lower-sodium products, often labeled as "lower sodium" or "no salt added."  Eat fresh foods.  Eat more vegetables, fruits, and low-fat dairy products.  Choose whole grains. Look for the word "whole" as the first word in the ingredient list.  Choose fish and skinless chicken or [place] more often than red meat. Limit fish, poultry, and meat to 6 oz (170 g) each day.  Limit sweets, desserts, sugars, and sugary drinks.  Choose heart-healthy fats.  Limit cheese to 1 oz (28 g) per day.  Eat more home-cooked food and less restaurant, buffet, and fast food.  Limit fried foods.  Cook foods using methods other than frying.  Limit canned vegetables. If you do use them, rinse them well to decrease the sodium.  When eating at a restaurant, ask that your food be prepared with less salt, or no salt if possible. WHAT FOODS CAN I EAT? Seek help from a dietitian for individual calorie needs. Grains Whole grain or whole wheat bread.  Brown rice. Whole grain or whole wheat pasta. Quinoa, bulgur, and whole grain cereals. Low-sodium cereals. Corn or whole wheat flour tortillas. Whole grain cornbread. Whole grain crackers. Low-sodium crackers. Vegetables Fresh or frozen vegetables (raw, steamed, roasted, or grilled). Low-sodium or reduced-sodium tomato and vegetable juices. Low-sodium or reduced-sodium tomato sauce and paste. Low-sodium or reduced-sodium canned vegetables.  Fruits All fresh, canned (in natural juice), or frozen fruits. Meat and Other Protein Products Ground beef (85% or leaner), grass-fed beef, or beef trimmed of fat. Skinless chicken or [place]. Ground chicken or [place]. Pork trimmed of fat. All fish and seafood. Eggs. Dried beans, peas, or lentils. Unsalted nuts and seeds. Unsalted canned beans. Dairy Low-fat dairy products, such as skim or 1% milk, 2% or reduced-fat cheeses, low-fat ricotta or cottage cheese, or plain low-fat yogurt. Low-sodium or reduced-sodium cheeses. Fats and Oils Tub margarines without trans fats. Light or reduced-fat mayonnaise and salad dressings (reduced sodium). Avocado. Safflower, olive, or canola oils. Natural peanut or almond butter. Other Unsalted popcorn and pretzels. The items listed above may not be a complete list of recommended foods or beverages. Contact your dietitian for more options. WHAT FOODS ARE NOT RECOMMENDED? Grains White bread. White pasta. White rice. Refined cornbread. Bagels and croissants. Crackers that contain trans fat. Vegetables Creamed or fried vegetables. Vegetables in a cheese sauce. Regular canned vegetables. Regular canned tomato sauce and paste. Regular tomato and vegetable juices. Fruits Dried fruits. Canned fruit in light or heavy syrup. Fruit juice.  Meat and Other Protein Products Fatty cuts of meat. Ribs, chicken wings, bacon, sausage, bologna, salami, chitterlings, fatback, hot dogs, bratwurst, and packaged luncheon meats. Salted nuts and seeds.  Canned beans with salt. Dairy Whole or 2% milk, cream, half-and-half, and cream cheese. Whole-fat or sweetened yogurt. Full-fat cheeses or blue cheese. Nondairy creamers and whipped toppings. Processed cheese, cheese spreads, or cheese curds. Condiments Onion and garlic salt, seasoned salt, table salt, and sea salt. Canned and packaged gravies. Worcestershire sauce. Tartar sauce. Barbecue sauce. Teriyaki sauce. Soy sauce, including reduced sodium. Steak sauce. Fish sauce. Oyster sauce. Cocktail sauce. Horseradish. Ketchup and mustard. Meat flavorings and tenderizers. Bouillon cubes. Hot sauce. Tabasco sauce. Marinades. Taco seasonings. Relishes. Fats and Oils Butter, stick margarine, lard, shortening, ghee, and bacon fat. Coconut, palm kernel, or palm oils. Regular salad dressings. Other Pickles and olives. Salted popcorn and pretzels. The items listed above may not be a complete list of foods and beverages to avoid. Contact your dietitian for more information. WHERE CAN I FIND MORE INFORMATION? National Heart, Lung, and Blood Institute: travelstabloid.com   This information is not intended to replace advice given to you by your health care provider. Make sure you discuss any questions you have with your health care provider.   Document Released: 07/25/2011 Document Revised: 08/26/2014 Document Reviewed: 06/09/2013 Elsevier Interactive Patient Education [employer].

## 2015-10-23 NOTE — Progress Notes (Signed)
Subjective:  Patient ID: Kristi Willis, female    DOB: 11-Jun-1941  Age: 75 y.o. MRN: [id number]  CC: Diabetes; Hyperlipidemia; and Depression   HPI TARYAH SHIFLETT presents for  follow-up of hypertension. Patient has no history of headache chest pain or shortness of breath or recent cough. Patient also denies symptoms of TIA such as numbness weakness lateralizing. Patient checks  blood pressure at home and has not had any elevated readings recently. Patient denies side effects from his medication. States taking it regularly.  Patient also  in for follow-up of elevated cholesterol. Doing well without complaints on current medication. Denies side effects of statin including myalgia and arthralgia and nausea. Also in today for liver function testing. Currently no chest pain, shortness of breath or other cardiovascular related symptoms noted.  Follow-up of diabetes. Patient does check blood sugar at home. Readings run between 172 and 235 Off of her diet.Patient denies symptoms such as polyuria, polydipsia, excessive hunger, nausea No significant hypoglycemic spells noted. Medications as noted below. Metformin upsets her stomach. Not taking regularly.   History Chanetta has a past medical history of SVT (supraventricular tachycardia) ([place]); Hypertension; Diabetes mellitus; Hypercholesteremia; Neuropathy ([place]); Asthma; GERD (gastroesophageal reflux disease); Cancer ([place]); Neuromuscular disorder ([place]); and Allergy.   She has past surgical history that includes Lumbar laminectomy (08/10); Spine surgery; Breast biopsy; and Skin cancer excision (2005).   Her family history includes Cancer in her father; Cancer (age of onset: 14) in her mother; Cancer (age of onset: 50) in her sister; Depression in her mother; Heart disease in her father; Heart disease (age of onset: 44) in her sister; Hypertension in her sister; Stroke in her father.She reports that she has never smoked. She has never used  smokeless tobacco. She reports that she does not drink alcohol or use illicit drugs.  Current Outpatient Prescriptions on File Prior to Visit  Medication Sig Dispense Refill  . acyclovir ointment (ZOVIRAX) 5 % Apply 1 application topically every 3 (three) hours. Around mouth, Until symptoms clear (Patient not taking: Reported on 10/09/2015) 15 g 5  . albuterol (VENTOLIN HFA) 108 (90 BASE) MCG/ACT inhaler Inhale 2 puffs into the lungs every 6 (six) hours as needed for wheezing. 1 Inhaler 3  . amLODipine (NORVASC) 10 MG tablet     . azelastine (ASTELIN) 0.1 % nasal spray Place 2 sprays into both nostrils 2 (two) times daily. 30 mL 11  . Calcium Carbonate-Vitamin D (CALTRATE 600+D) 600-400 MG-UNIT per tablet Take 1 tablet by mouth 2 (two) times daily.    . Cholecalciferol (VITAMIN D3) 1000 UNITS CAPS Take by mouth daily.      Marland Kitchen EPIPEN 2-PAK 0.3 MG/0.3ML SOAJ injection     . lisinopril (PRINIVIL,ZESTRIL) 40 MG tablet Take 1 tablet (40 mg total) by mouth daily. TAKE 1 TABLET DAILY 30 tablet 5  . loratadine (CLARITIN) 10 MG tablet Take 1 tablet (10 mg total) by mouth daily. 30 tablet 11  . metoprolol (LOPRESSOR) 100 MG tablet Take 1 tablet (100 mg total) by mouth 2 (two) times daily. 180 tablet 3  . mirtazapine (REMERON) 7.5 MG tablet Take 1 tablet (7.5 mg total) by mouth at bedtime. 30 tablet 0  . montelukast (SINGULAIR) 10 MG tablet Take 1 tablet (10 mg total) by mouth at bedtime. 30 tablet 5  . Multiple Vitamin (MULTIVITAMIN) tablet Take 1 tablet by mouth daily.      . Pyridoxine HCl (VITAMIN B-6 PO) Take 1 tablet by mouth 2 (two) times daily.    Marland Kitchen  rosuvastatin (CRESTOR) 10 MG tablet Take 1 tablet (10 mg total) by mouth daily. 90 tablet 3  . vitamin B-12 (CYANOCOBALAMIN) 500 MCG tablet Take 500 mcg by mouth daily.       No current facility-administered medications on file prior to visit.    ROS Review of Systems  Constitutional: Negative for fever, activity change and appetite change.  HENT:  Negative for congestion, rhinorrhea and sore throat.   Eyes: Negative for visual disturbance.  Respiratory: Negative for cough and shortness of breath.   Cardiovascular: Negative for chest pain and palpitations.  Gastrointestinal: Positive for diarrhea. Negative for nausea and abdominal pain.  Genitourinary: Negative for dysuria.  Musculoskeletal: Negative for myalgias and arthralgias.    Objective:  BP 154/71 mmHg  Pulse 72  Temp(Src) 97.8 F (36.6 C) (Oral)  Ht 5\' 3"  (1.6 m)  Wt 167 lb 6.4 oz (75.932 kg)  BMI 29.66 kg/m2  BP Readings from Last 3 Encounters:  10/23/15 154/71  10/09/15 149/65  09/04/15 147/68    Wt Readings from Last 3 Encounters:  10/23/15 167 lb 6.4 oz (75.932 kg)  10/09/15 165 lb 6.4 oz (75.025 kg)  09/04/15 170 lb (77.111 kg)     Physical Exam  Constitutional: She is oriented to person, place, and time. She appears well-developed and well-nourished. No distress.  HENT:  Head: Normocephalic and atraumatic.  Right Ear: External ear normal.  Left Ear: External ear normal.  Nose: Nose normal.  Mouth/Throat: Oropharynx is clear and moist.  Eyes: Conjunctivae and EOM are normal. Pupils are equal, round, and reactive to light.  Neck: Normal range of motion. Neck supple. No thyromegaly present.  Cardiovascular: Normal rate, regular rhythm and normal heart sounds.   No murmur heard. Pulmonary/Chest: Effort normal and breath sounds normal. No respiratory distress. She has no wheezes. She has no rales.  Abdominal: Soft. Bowel sounds are normal. She exhibits no distension. There is no tenderness.  Lymphadenopathy:    She has no cervical adenopathy.  Neurological: She is alert and oriented to person, place, and time. She has normal reflexes.  Skin: Skin is warm and dry.  Psychiatric: She has a normal mood and affect. Her behavior is normal. Judgment and thought content normal.    Lab Results  Component Value Date   HGBA1C 8.9 10/13/2015   HGBA1C 7.9  07/18/2015   HGBA1C 7.7 04/14/2015    Lab Results  Component Value Date   WBC 7.5 10/13/2015   HGB 12.9 01/12/2015   HCT 37.4 10/13/2015   PLT 215 10/13/2015   GLUCOSE 228* 10/13/2015   CHOL 174 04/12/2015   TRIG 174* 04/12/2015   HDL 50 04/12/2015   LDLCALC 89 04/12/2015   ALT 35* 10/13/2015   AST 31 10/13/2015   NA 140 10/13/2015   K 4.1 10/13/2015   CL 97 10/13/2015   CREATININE 0.58 10/13/2015   BUN 11 10/13/2015   CO2 23 10/13/2015   TSH 3.340 01/12/2015   INR 1.0 04/11/2009   HGBA1C 8.9 10/13/2015   MICROALBUR 1.45 11/25/2012    US Breast Ltd Uni Left Inc Axilla  10/12/2015  CLINICAL DATA:  Six-month follow-up status post left breast biopsy with pathology results demonstrating fibrocystic change with focal sclerosing adenosis and associated microcalcifications. EXAM: DIGITAL DIAGNOSTIC LEFT MAMMOGRAM WITH 3D TOMOSYNTHESIS AND CAD LEFT BREAST ULTRASOUND COMPARISON:  Previous exam(s). ACR Breast Density Category c: The breast tissue is heterogeneously dense, which may obscure small masses. FINDINGS: The coil shaped biopsy marking clip is again identified  in the inferior left breast, middle depth. No suspicious calcifications, masses or areas of distortion are seen in the left breast. Mammographic images were processed with CAD. Physical exam of the inferior left breast demonstrates normal fibroglandular tissue without discrete palpable mass. Ultrasound targeted to the inferior left breast demonstrates ill-defined shadowing areas, similar to the prior ultrasound. There is no significant interval change. IMPRESSION: No significant interval change in the appearance of the tissue in the inferior left breast at site of prior ultrasound-guided biopsy. RECOMMENDATION: Return to screening mammography is recommended. The patient will be due for screening in June of 2017. I have discussed the findings and recommendations with the patient. Results were also provided in writing at the  conclusion of the visit. If applicable, a reminder letter will be sent to the patient regarding the next appointment. BI-RADS CATEGORY  2: Benign. Electronically Signed   By: Ammie Ferrier M.D.   On: 10/12/2015 13:49   Mm Diag Breast Tomo Uni Left  10/12/2015  CLINICAL DATA:  Six-month follow-up status post left breast biopsy with pathology results demonstrating fibrocystic change with focal sclerosing adenosis and associated microcalcifications. EXAM: DIGITAL DIAGNOSTIC LEFT MAMMOGRAM WITH 3D TOMOSYNTHESIS AND CAD LEFT BREAST ULTRASOUND COMPARISON:  Previous exam(s). ACR Breast Density Category c: The breast tissue is heterogeneously dense, which may obscure small masses. FINDINGS: The coil shaped biopsy marking clip is again identified in the inferior left breast, middle depth. No suspicious calcifications, masses or areas of distortion are seen in the left breast. Mammographic images were processed with CAD. Physical exam of the inferior left breast demonstrates normal fibroglandular tissue without discrete palpable mass. Ultrasound targeted to the inferior left breast demonstrates ill-defined shadowing areas, similar to the prior ultrasound. There is no significant interval change. IMPRESSION: No significant interval change in the appearance of the tissue in the inferior left breast at site of prior ultrasound-guided biopsy. RECOMMENDATION: Return to screening mammography is recommended. The patient will be due for screening in June of 2017. I have discussed the findings and recommendations with the patient. Results were also provided in writing at the conclusion of the visit. If applicable, a reminder letter will be sent to the patient regarding the next appointment. BI-RADS CATEGORY  2: Benign. Electronically Signed   By: Ammie Ferrier M.D.   On: 10/12/2015 13:49    Assessment & Plan:   Lazariah was seen today for diabetes, hyperlipidemia and depression.  Diagnoses and all orders for this  visit:  Type 2 diabetes mellitus with hyperglycemia, without long-term current use of insulin (HCC)  Essential hypertension  Hyperlipidemia  Other orders -     doxazosin (CARDURA) 8 MG tablet; Take 1 tablet (8 mg total) by mouth daily. For blood pressure -     sitaGLIPtin (JANUVIA) 100 MG tablet; Take 1 tablet (100 mg total) by mouth daily.  I have discontinued Ms. Porcelli's terazosin and metFORMIN. I am also having her start on doxazosin and sitaGLIPtin. Additionally, I am having her maintain her vitamin B-12, multivitamin, Vitamin D3, Calcium Carbonate-Vitamin D, Pyridoxine HCl (VITAMIN B-6 PO), EPIPEN 2-PAK, azelastine, loratadine, metoprolol, lisinopril, montelukast, rosuvastatin, albuterol, acyclovir ointment, amLODipine, and mirtazapine.  Meds ordered this encounter  Medications  . doxazosin (CARDURA) 8 MG tablet    Sig: Take 1 tablet (8 mg total) by mouth daily. For blood pressure    Dispense:  90 tablet    Refill:  3  . sitaGLIPtin (JANUVIA) 100 MG tablet    Sig: Take 1 tablet (100 mg total)  by mouth daily.    Dispense:  30 tablet    Refill:  2     Follow-up: Return in about 3 months (around 01/23/2016) for diabetes, hypertension.  Claretta Fraise, M.D.

## 2015-10-26 ENCOUNTER — Telehealth: Payer: Self-pay | Admitting: *Deleted

## 2015-10-26 NOTE — Telephone Encounter (Signed)
CH - LM

## 2015-10-26 NOTE — Telephone Encounter (Signed)
Please have patient come by and get a blood pressure here and make sure that she is drinking plenty of fluids. Give her a return appointment with Dr. Livia Snellen when he returns next week

## 2015-10-27 ENCOUNTER — Other Ambulatory Visit: Payer: Self-pay | Admitting: Family Medicine

## 2015-10-30 NOTE — Telephone Encounter (Signed)
Patients daughter states that she went back to her old medications and is feeling better. Patients daughter advised that she still needs to be seen and she will call back to schedule an appointment.

## 2015-11-08 ENCOUNTER — Telehealth: Payer: Self-pay | Admitting: Family Medicine

## 2015-11-08 NOTE — Telephone Encounter (Signed)
Pt's daughter called and wanted to see if she could get her mom in any earlier than 2:45 tomorrow. Pt was given 7:55 appt with Dr.Stacks for tomorrow.

## 2015-11-09 ENCOUNTER — Ambulatory Visit (INDEPENDENT_AMBULATORY_CARE_PROVIDER_SITE_OTHER): Payer: Medicare Other | Admitting: Family Medicine

## 2015-11-09 ENCOUNTER — Encounter: Payer: Self-pay | Admitting: Family Medicine

## 2015-11-09 VITALS — BP 144/65 | HR 69 | Temp 97.3°F | Ht 63.0 in | Wt 168.4 lb

## 2015-11-09 DIAGNOSIS — E162 Hypoglycemia, unspecified: Secondary | ICD-10-CM

## 2015-11-09 DIAGNOSIS — E1165 Type 2 diabetes mellitus with hyperglycemia: Secondary | ICD-10-CM

## 2015-11-09 DIAGNOSIS — F4321 Adjustment disorder with depressed mood: Secondary | ICD-10-CM

## 2015-11-09 DIAGNOSIS — I10 Essential (primary) hypertension: Secondary | ICD-10-CM

## 2015-11-09 MED ORDER — MIRTAZAPINE 7.5 MG PO TABS: 7.5000 mg | ORAL_TABLET | Freq: Every day | ORAL | Status: DC

## 2015-11-09 MED ORDER — SITAGLIPTIN PHOSPHATE 50 MG PO TABS
50.0000 mg | ORAL_TABLET | Freq: Every day | ORAL | Status: DC
Start: 2015-11-09 — End: 2015-12-18

## 2015-11-09 NOTE — Patient Instructions (Addendum)
Do not take metformin until you follow up in the office. I will write a prescription for a half-strength Januvia. Take 1 of those daily. They will be 50 mg tablets.  If you have no spells again over the next 2 weeks try going back to the full strength 100 mg once a day. Do not do so before April 6. Do not increase the dose if your sugar starts dropping below 100 fasting or if you have a spell similar to before.  Decrease the doxazosin to 1/2 tablet daily for blood pressure.  Take at bedtime.

## 2015-11-09 NOTE — Progress Notes (Signed)
Subjective:  Patient ID: Kristi Willis, female    DOB: 01/20/1941  Age: 75 y.o. MRN: [id number]  CC: Hypertension and Diabetes   HPI Kristi Willis presents for  follow-up of hypertension. Patient has no history of headache chest pain or shortness of breath or recent cough. Patient also denies symptoms of TIA such as numbness weakness lateralizing. Patient checks  blood pressure at home and has not had any elevated readings recently. Patient denies side effects from medication. States taking it regularly.   History Paiten has a past medical history of SVT (supraventricular tachycardia) ([place]); Hypertension; Diabetes mellitus; Hypercholesteremia; Neuropathy ([place]); Asthma; GERD (gastroesophageal reflux disease); Cancer ([place]); Neuromuscular disorder ([place]); and Allergy.   She has past surgical history that includes Lumbar laminectomy (08/10); Spine surgery; Breast biopsy; and Skin cancer excision (2005).   Her family history includes Cancer in her father; Cancer (age of onset: 56) in her mother; Cancer (age of onset: 80) in her sister; Depression in her mother; Heart disease in her father; Heart disease (age of onset: 64) in her sister; Hypertension in her sister; Stroke in her father.She reports that she has never smoked. She has never used smokeless tobacco. She reports that she does not drink alcohol or use illicit drugs.  Current Outpatient Prescriptions on File Prior to Visit  Medication Sig Dispense Refill  . albuterol (VENTOLIN HFA) 108 (90 BASE) MCG/ACT inhaler Inhale 2 puffs into the lungs every 6 (six) hours as needed for wheezing. 1 Inhaler 3  . azelastine (ASTELIN) 0.1 % nasal spray Place 2 sprays into both nostrils 2 (two) times daily. 30 mL 11  . Calcium Carbonate-Vitamin D (CALTRATE 600+D) 600-400 MG-UNIT per tablet Take 1 tablet by mouth 2 (two) times daily.    . Cholecalciferol (VITAMIN D3) 1000 UNITS CAPS Take by mouth daily.      Marland Kitchen EPIPEN 2-PAK 0.3 MG/0.3ML SOAJ  injection     . lisinopril (PRINIVIL,ZESTRIL) 40 MG tablet TAKE 1 TABLET DAILY 90 tablet 1  . loratadine (CLARITIN) 10 MG tablet Take 1 tablet (10 mg total) by mouth daily. 30 tablet 11  . metoprolol (LOPRESSOR) 100 MG tablet Take 1 tablet (100 mg total) by mouth 2 (two) times daily. 180 tablet 3  . montelukast (SINGULAIR) 10 MG tablet Take 1 tablet (10 mg total) by mouth at bedtime. 30 tablet 5  . Multiple Vitamin (MULTIVITAMIN) tablet Take 1 tablet by mouth daily.      . Pyridoxine HCl (VITAMIN B-6 PO) Take 1 tablet by mouth 2 (two) times daily.    . rosuvastatin (CRESTOR) 10 MG tablet Take 1 tablet (10 mg total) by mouth daily. 90 tablet 3  . vitamin B-12 (CYANOCOBALAMIN) 500 MCG tablet Take 500 mcg by mouth daily.      Marland Kitchen acyclovir ointment (ZOVIRAX) 5 % Apply 1 application topically every 3 (three) hours. Around mouth, Until symptoms clear (Patient not taking: Reported on 10/09/2015) 15 g 5  . doxazosin (CARDURA) 8 MG tablet Take 1 tablet (8 mg total) by mouth daily. For blood pressure (Patient not taking: Reported on 11/09/2015) 90 tablet 3  . sitaGLIPtin (JANUVIA) 100 MG tablet Take 1 tablet (100 mg total) by mouth daily. (Patient not taking: Reported on 11/09/2015) 30 tablet 2   No current facility-administered medications on file prior to visit.    ROS Review of Systems  Constitutional: Negative for fever, activity change and appetite change.  HENT: Negative for congestion, rhinorrhea and sore throat.   Eyes: Negative for visual  disturbance.  Respiratory: Negative for cough and shortness of breath.   Cardiovascular: Negative for chest pain and palpitations.  Gastrointestinal: Negative for nausea, abdominal pain and diarrhea.  Genitourinary: Negative for dysuria.  Musculoskeletal: Negative for myalgias and arthralgias.    Objective:  BP 144/65 mmHg  Pulse 69  Temp(Src) 97.3 F (36.3 C) (Oral)  Ht '5\' 3"'  (1.6 m)  Wt 168 lb 6.4 oz (76.386 kg)  BMI 29.84 kg/m2  SpO2 98%  BP  Readings from Last 3 Encounters:  11/09/15 144/65  10/23/15 154/71  10/09/15 149/65    Wt Readings from Last 3 Encounters:  11/09/15 168 lb 6.4 oz (76.386 kg)  10/23/15 167 lb 6.4 oz (75.932 kg)  10/09/15 165 lb 6.4 oz (75.025 kg)     Physical Exam  Constitutional: She is oriented to person, place, and time. She appears well-developed and well-nourished. No distress.  HENT:  Head: Normocephalic and atraumatic.  Right Ear: External ear normal.  Left Ear: External ear normal.  Nose: Nose normal.  Mouth/Throat: Oropharynx is clear and moist.  Eyes: Conjunctivae and EOM are normal. Pupils are equal, round, and reactive to light.  Neck: Normal range of motion. Neck supple. No thyromegaly present.  Cardiovascular: Normal rate, regular rhythm and normal heart sounds.   No murmur heard. Pulmonary/Chest: Effort normal and breath sounds normal. No respiratory distress. She has no wheezes. She has no rales.  Abdominal: Soft. Bowel sounds are normal. She exhibits no distension. There is no tenderness.  Lymphadenopathy:    She has no cervical adenopathy.  Neurological: She is alert and oriented to person, place, and time. She has normal reflexes.  Skin: Skin is warm and dry.  Psychiatric: She has a normal mood and affect. Her behavior is normal. Judgment and thought content normal.     Lab Results  Component Value Date   WBC 7.5 10/13/2015   HGB 12.9 01/12/2015   HCT 37.4 10/13/2015   PLT 215 10/13/2015   GLUCOSE 228* 10/13/2015   CHOL 174 04/12/2015   TRIG 174* 04/12/2015   HDL 50 04/12/2015   LDLCALC 89 04/12/2015   ALT 35* 10/13/2015   AST 31 10/13/2015   NA 140 10/13/2015   K 4.1 10/13/2015   CL 97 10/13/2015   CREATININE 0.58 10/13/2015   BUN 11 10/13/2015   CO2 23 10/13/2015   TSH 3.340 01/12/2015   INR 1.0 04/11/2009   HGBA1C 8.9 10/13/2015   MICROALBUR 1.45 11/25/2012      Assessment & Plan:   Kristi Willis was seen today for hypertension and  diabetes.  Diagnoses and all orders for this visit:  Hypoglycemia -     CMP14+EGFR  Essential hypertension -     CMP14+EGFR  Adjustment disorder with depressed mood  Type 2 diabetes mellitus with hyperglycemia, without long-term current use of insulin (HCC) -     CMP14+EGFR  Other orders -     sitaGLIPtin (JANUVIA) 50 MG tablet; Take 1 tablet (50 mg total) by mouth daily. -     mirtazapine (REMERON) 7.5 MG tablet; Take 1 tablet (7.5 mg total) by mouth at bedtime.   I am having Ms. Dragan start on sitaGLIPtin. I am also having her maintain her vitamin B-12, multivitamin, Vitamin D3, Calcium Carbonate-Vitamin D, Pyridoxine HCl (VITAMIN B-6 PO), EPIPEN 2-PAK, azelastine, loratadine, metoprolol, montelukast, rosuvastatin, albuterol, acyclovir ointment, doxazosin, sitaGLIPtin, lisinopril, amLODipine, metoprolol succinate, and mirtazapine.  Meds ordered this encounter  Medications  . amLODipine (NORVASC) 5 MG tablet    Sig:  Take 1 tablet by mouth daily.  . metoprolol succinate (TOPROL-XL) 100 MG 24 hr tablet    Sig: Take 1 tablet by mouth 2 (two) times daily.  . sitaGLIPtin (JANUVIA) 50 MG tablet    Sig: Take 1 tablet (50 mg total) by mouth daily.    Dispense:  30 tablet    Refill:  0  . mirtazapine (REMERON) 7.5 MG tablet    Sig: Take 1 tablet (7.5 mg total) by mouth at bedtime.    Dispense:  30 tablet    Refill:  0    Do not take metformin until you follow up in the office. I will write a prescription for a half-strength Januvia. Take 1 of those daily. They will be 50 mg tablets.  If you have no spells again over the next 2 weeks try going back to the full strength 100 mg once a day. Do not do so before April 6. Do not increase the dose if your sugar starts dropping below 100 fasting or if you have a spell similar to before.  Decrease the doxazosin to 1/2 tablet daily for blood pressure.  Take at bedtime.   Follow-up: Return in about 1 month (around 12/10/2015), or if  symptoms worsen or fail to improve.  Claretta Fraise, M.D.

## 2015-11-10 ENCOUNTER — Other Ambulatory Visit: Payer: Self-pay | Admitting: Family Medicine

## 2015-11-10 LAB — CMP14+EGFR
A/G RATIO: 1.8 (ref 1.2–2.2)
ALK PHOS: 69 IU/L (ref 39–117)
ALT: 41 IU/L — ABNORMAL HIGH (ref 0–32)
AST: 54 IU/L — AB (ref 0–40)
Albumin: 4.4 g/dL (ref 3.5–4.8)
BILIRUBIN TOTAL: 0.4 mg/dL (ref 0.0–1.2)
BUN/Creatinine Ratio: 23 (ref 11–26)
BUN: 12 mg/dL (ref 8–27)
CALCIUM: 9.5 mg/dL (ref 8.7–10.3)
CHLORIDE: 96 mmol/L (ref 96–106)
CO2: 23 mmol/L (ref 18–29)
Creatinine, Ser: 0.53 mg/dL — ABNORMAL LOW (ref 0.57–1.00)
GFR calc Af Amer: 108 mL/min/{1.73_m2} (ref >59)
GFR, EST NON AFRICAN AMERICAN: 94 mL/min/{1.73_m2} (ref >59)
GLOBULIN, TOTAL: 2.5 g/dL (ref 1.5–4.5)
Glucose: 233 mg/dL — ABNORMAL HIGH (ref 65–99)
POTASSIUM: 4.4 mmol/L (ref 3.5–5.2)
SODIUM: 137 mmol/L (ref 134–144)
Total Protein: 6.9 g/dL (ref 6.0–8.5)

## 2015-12-06 ENCOUNTER — Other Ambulatory Visit: Payer: Self-pay | Admitting: Family Medicine

## 2015-12-07 ENCOUNTER — Other Ambulatory Visit: Payer: Self-pay | Admitting: *Deleted

## 2015-12-07 ENCOUNTER — Other Ambulatory Visit: Payer: Medicare Other

## 2015-12-07 DIAGNOSIS — R600 Localized edema: Secondary | ICD-10-CM

## 2015-12-07 DIAGNOSIS — I10 Essential (primary) hypertension: Secondary | ICD-10-CM

## 2015-12-07 DIAGNOSIS — G4700 Insomnia, unspecified: Secondary | ICD-10-CM

## 2015-12-07 DIAGNOSIS — F4321 Adjustment disorder with depressed mood: Secondary | ICD-10-CM

## 2015-12-07 DIAGNOSIS — E1165 Type 2 diabetes mellitus with hyperglycemia: Secondary | ICD-10-CM

## 2015-12-07 DIAGNOSIS — E785 Hyperlipidemia, unspecified: Secondary | ICD-10-CM

## 2015-12-08 LAB — CMP14+EGFR
A/G RATIO: 1.8 (ref 1.2–2.2)
ALK PHOS: 65 IU/L (ref 39–117)
ALT: 34 IU/L — ABNORMAL HIGH (ref 0–32)
AST: 58 IU/L — AB (ref 0–40)
Albumin: 4.4 g/dL (ref 3.5–4.8)
BUN/Creatinine Ratio: 20 (ref 12–28)
BUN: 11 mg/dL (ref 8–27)
Bilirubin Total: 0.3 mg/dL (ref 0.0–1.2)
CO2: 23 mmol/L (ref 18–29)
Calcium: 9.4 mg/dL (ref 8.7–10.3)
Chloride: 96 mmol/L (ref 96–106)
Creatinine, Ser: 0.54 mg/dL — ABNORMAL LOW (ref 0.57–1.00)
GFR calc Af Amer: 108 mL/min/{1.73_m2} (ref >59)
GFR, EST NON AFRICAN AMERICAN: 93 mL/min/{1.73_m2} (ref >59)
GLOBULIN, TOTAL: 2.5 g/dL (ref 1.5–4.5)
Glucose: 248 mg/dL — ABNORMAL HIGH (ref 65–99)
POTASSIUM: 4.3 mmol/L (ref 3.5–5.2)
SODIUM: 139 mmol/L (ref 134–144)
Total Protein: 6.9 g/dL (ref 6.0–8.5)

## 2015-12-08 LAB — LIPID PANEL
CHOL/HDL RATIO: 3.6 ratio (ref 0.0–4.4)
Cholesterol, Total: 184 mg/dL (ref 100–199)
HDL: 51 mg/dL (ref >39)
LDL CALC: 92 mg/dL (ref 0–99)
TRIGLYCERIDES: 207 mg/dL — AB (ref 0–149)
VLDL CHOLESTEROL CAL: 41 mg/dL — AB (ref 5–40)

## 2015-12-08 LAB — CBC WITH DIFFERENTIAL/PLATELET
BASOS ABS: 0 10*3/uL (ref 0.0–0.2)
Basos: 0 %
EOS (ABSOLUTE): 0.6 10*3/uL — AB (ref 0.0–0.4)
Eos: 8 %
Hematocrit: 37.8 % (ref 34.0–46.6)
Hemoglobin: 11.9 g/dL (ref 11.1–15.9)
Immature Grans (Abs): 0 10*3/uL (ref 0.0–0.1)
Immature Granulocytes: 0 %
LYMPHS ABS: 2.3 10*3/uL (ref 0.7–3.1)
Lymphs: 34 %
MCH: 27.7 pg (ref 26.6–33.0)
MCHC: 31.5 g/dL (ref 31.5–35.7)
MCV: 88 fL (ref 79–97)
MONOS ABS: 0.5 10*3/uL (ref 0.1–0.9)
Monocytes: 7 %
NEUTROS ABS: 3.6 10*3/uL (ref 1.4–7.0)
Neutrophils: 51 %
Platelets: 217 10*3/uL (ref 150–379)
RBC: 4.3 x10E6/uL (ref 3.77–5.28)
RDW: 13.7 % (ref 12.3–15.4)
WBC: 7 10*3/uL (ref 3.4–10.8)

## 2015-12-11 ENCOUNTER — Ambulatory Visit: Payer: Medicare Other | Admitting: Family Medicine

## 2015-12-11 NOTE — Progress Notes (Signed)
Quick Note:  Will review at office visit ______

## 2015-12-18 ENCOUNTER — Ambulatory Visit (INDEPENDENT_AMBULATORY_CARE_PROVIDER_SITE_OTHER): Payer: Medicare Other | Admitting: Family Medicine

## 2015-12-18 ENCOUNTER — Encounter: Payer: Self-pay | Admitting: Family Medicine

## 2015-12-18 VITALS — BP 142/57 | HR 74 | Temp 98.3°F | Ht 63.0 in | Wt 170.0 lb

## 2015-12-18 DIAGNOSIS — E1165 Type 2 diabetes mellitus with hyperglycemia: Secondary | ICD-10-CM | POA: Diagnosis not present

## 2015-12-18 DIAGNOSIS — H1013 Acute atopic conjunctivitis, bilateral: Secondary | ICD-10-CM | POA: Diagnosis not present

## 2015-12-18 DIAGNOSIS — I10 Essential (primary) hypertension: Secondary | ICD-10-CM | POA: Diagnosis not present

## 2015-12-18 MED ORDER — OLOPATADINE HCL 0.1 % OP SOLN: 1.0000 [drp] | Freq: Two times a day (BID) | OPHTHALMIC | Status: DC

## 2015-12-18 MED ORDER — SITAGLIPTIN PHOSPHATE 100 MG PO TABS: 100.0000 mg | ORAL_TABLET | Freq: Every day | ORAL | Status: DC

## 2015-12-18 NOTE — Progress Notes (Signed)
Subjective:  Patient ID: Kristi Willis, female    DOB: 02-03-1941  Age: 75 y.o. MRN: [id number]  CC: Hypertension and Diabetes   HPI Kristi Willis presents for  follow-up of hypertension. Patient has no history of headache chest pain or shortness of breath or recent cough. Patient also denies symptoms of TIA such as numbness weakness lateralizing. Patient checks  blood pressure at home and has not had any elevated readings recently. Patient denies side effects from medication. States taking it regularly.   Patient states that she continues to have some low sugars and some highs she has not been checking when it goes low she just eats or drinks something. When he goes higher such as about 172 she goes ahead with the metformin. Then she will have it go too low after the metformin.  Patient mentions that she woke up this morning with blurred vision area things are just a little bit foggy. She can read larger print but not newsprint.   History Murray has a past medical history of SVT (supraventricular tachycardia) ([place]); Hypertension; Diabetes mellitus; Hypercholesteremia; Neuropathy ([place]); Asthma; GERD (gastroesophageal reflux disease); Cancer ([place]); Neuromuscular disorder ([place]); and Allergy.   She has past surgical history that includes Lumbar laminectomy (08/10); Spine surgery; Breast biopsy; and Skin cancer excision (2005).   Her family history includes Cancer in her father; Cancer (age of onset: 6) in her mother; Cancer (age of onset: 19) in her sister; Depression in her mother; Heart disease in her father; Heart disease (age of onset: 54) in her sister; Hypertension in her sister; Stroke in her father.She reports that she has never smoked. She has never used smokeless tobacco. She reports that she does not drink alcohol or use illicit drugs.  Current Outpatient Prescriptions on File Prior to Visit  Medication Sig Dispense Refill  . acyclovir ointment (ZOVIRAX) 5 % Apply 1  application topically every 3 (three) hours. Around mouth, Until symptoms clear 15 g 5  . albuterol (VENTOLIN HFA) 108 (90 BASE) MCG/ACT inhaler Inhale 2 puffs into the lungs every 6 (six) hours as needed for wheezing. 1 Inhaler 3  . amLODipine (NORVASC) 5 MG tablet Take 1 tablet by mouth daily.    Marland Kitchen azelastine (ASTELIN) 0.1 % nasal spray Place 2 sprays into both nostrils 2 (two) times daily. 30 mL 11  . Calcium Carbonate-Vitamin D (CALTRATE 600+D) 600-400 MG-UNIT per tablet Take 1 tablet by mouth 2 (two) times daily.    . Cholecalciferol (VITAMIN D3) 1000 UNITS CAPS Take by mouth daily.      Marland Kitchen doxazosin (CARDURA) 8 MG tablet Take 1 tablet (8 mg total) by mouth daily. For blood pressure 90 tablet 3  . EPIPEN 2-PAK 0.3 MG/0.3ML SOAJ injection     . lisinopril (PRINIVIL,ZESTRIL) 40 MG tablet TAKE 1 TABLET DAILY 90 tablet 1  . loratadine (CLARITIN) 10 MG tablet Take 1 tablet (10 mg total) by mouth daily. 30 tablet 11  . metoprolol succinate (TOPROL-XL) 100 MG 24 hr tablet Take 1 tablet by mouth 2 (two) times daily.    . mirtazapine (REMERON) 7.5 MG tablet Take 1 tablet (7.5 mg total) by mouth at bedtime. 30 tablet 4  . montelukast (SINGULAIR) 10 MG tablet Take 1 tablet (10 mg total) by mouth at bedtime. 30 tablet 5  . Multiple Vitamin (MULTIVITAMIN) tablet Take 1 tablet by mouth daily.      . Pyridoxine HCl (VITAMIN B-6 PO) Take 1 tablet by mouth 2 (two) times daily.    Marland Kitchen  rosuvastatin (CRESTOR) 10 MG tablet Take 1 tablet (10 mg total) by mouth daily. 90 tablet 3  . vitamin B-12 (CYANOCOBALAMIN) 500 MCG tablet Take 500 mcg by mouth daily.       No current facility-administered medications on file prior to visit.    ROS Review of Systems  Constitutional: Negative for fever, activity change and appetite change.  HENT: Negative for congestion, rhinorrhea and sore throat.   Eyes: Positive for photophobia and visual disturbance (blurry today).  Respiratory: Negative for cough and shortness of  breath.   Cardiovascular: Negative for chest pain and palpitations.  Gastrointestinal: Negative for nausea, abdominal pain and diarrhea.  Genitourinary: Negative for dysuria.  Musculoskeletal: Negative for myalgias and arthralgias.    Objective:  BP 142/57 mmHg  Pulse 74  Temp(Src) 98.3 F (36.8 C) (Oral)  Ht 5\' 3"  (1.6 m)  Wt 170 lb (77.111 kg)  BMI 30.12 kg/m2  SpO2 97%  BP Readings from Last 3 Encounters:  12/18/15 142/57  11/09/15 144/65  10/23/15 154/71    Wt Readings from Last 3 Encounters:  12/18/15 170 lb (77.111 kg)  11/09/15 168 lb 6.4 oz (76.386 kg)  10/23/15 167 lb 6.4 oz (75.932 kg)     Physical Exam  Constitutional: She is oriented to person, place, and time. She appears well-developed and well-nourished. No distress.  HENT:  Head: Normocephalic and atraumatic.  Right Ear: External ear normal.  Left Ear: External ear normal.  Nose: Nose normal.  Mouth/Throat: Oropharynx is clear and moist.  Eyes: EOM are normal. Pupils are equal, round, and reactive to light. Right eye exhibits chemosis. Left eye exhibits chemosis. Right conjunctiva is injected. Right conjunctiva has no hemorrhage. Left conjunctiva is injected. Left conjunctiva has no hemorrhage.  Chemosis primarily involving the lower lid bilaterally  Neck: Normal range of motion. Neck supple. No thyromegaly present.  Cardiovascular: Normal rate, regular rhythm and normal heart sounds.   No murmur heard. Pulmonary/Chest: Effort normal and breath sounds normal. No respiratory distress. She has no wheezes. She has no rales.  Abdominal: Soft. Bowel sounds are normal. She exhibits no distension. There is no tenderness.  Lymphadenopathy:    She has no cervical adenopathy.  Neurological: She is alert and oriented to person, place, and time. She has normal reflexes.  Skin: Skin is warm and dry.  Psychiatric: She has a normal mood and affect. Her behavior is normal. Judgment and thought content normal.      Lab Results  Component Value Date   WBC 7.0 12/07/2015   HGB 12.9 01/12/2015   HCT 37.8 12/07/2015   PLT 217 12/07/2015   GLUCOSE 248* 12/07/2015   CHOL 184 12/07/2015   TRIG 207* 12/07/2015   HDL 51 12/07/2015   LDLCALC 92 12/07/2015   ALT 34* 12/07/2015   AST 58* 12/07/2015   NA 139 12/07/2015   K 4.3 12/07/2015   CL 96 12/07/2015   CREATININE 0.54* 12/07/2015   BUN 11 12/07/2015   CO2 23 12/07/2015   TSH 3.340 01/12/2015   INR 1.0 04/11/2009   HGBA1C 8.9 10/13/2015   MICROALBUR 1.45 11/25/2012      Assessment & Plan:   Kristi Willis was seen today for hypertension and diabetes.  Diagnoses and all orders for this visit:  Type 2 diabetes mellitus with hyperglycemia, without long-term current use of insulin ([place]) -     Ambulatory referral to Ophthalmology  Essential hypertension  Allergic conjunctivitis, bilateral  Other orders -     olopatadine (PATANOL) 0.1 %  ophthalmic solution; Place 1 drop into both eyes 2 (two) times daily. -     sitaGLIPtin (JANUVIA) 100 MG tablet; Take 1 tablet (100 mg total) by mouth daily.   I have discontinued Ms. Boster's metoprolol and sitaGLIPtin. I have also changed her sitaGLIPtin. Additionally, I am having her start on olopatadine. Lastly, I am having her maintain her vitamin B-12, multivitamin, Vitamin D3, Calcium Carbonate-Vitamin D, Pyridoxine HCl (VITAMIN B-6 PO), EPIPEN 2-PAK, azelastine, loratadine, montelukast, rosuvastatin, albuterol, acyclovir ointment, doxazosin, lisinopril, amLODipine, metoprolol succinate, and mirtazapine.    Medication List       This list is accurate as of: 12/18/15  4:21 PM.  Always use your most recent med list.               acyclovir ointment 5 %  Commonly known as:  ZOVIRAX  Apply 1 application topically every 3 (three) hours. Around mouth, Until symptoms clear     albuterol 108 (90 Base) MCG/ACT inhaler  Commonly known as:  VENTOLIN HFA  Inhale 2 puffs into the lungs every 6 (six)  hours as needed for wheezing.     amLODipine 5 MG tablet  Commonly known as:  NORVASC  Take 1 tablet by mouth daily.     azelastine 0.1 % nasal spray  Commonly known as:  ASTELIN  Place 2 sprays into both nostrils 2 (two) times daily.     CALTRATE 600+D 600-400 MG-UNIT tablet  Generic drug:  Calcium Carbonate-Vitamin D  Take 1 tablet by mouth 2 (two) times daily.     doxazosin 8 MG tablet  Commonly known as:  CARDURA  Take 1 tablet (8 mg total) by mouth daily. For blood pressure     EPIPEN 2-PAK 0.3 mg/0.3 mL Soaj injection  Generic drug:  EPINEPHrine     lisinopril 40 MG tablet  Commonly known as:  PRINIVIL,ZESTRIL  TAKE 1 TABLET DAILY     loratadine 10 MG tablet  Commonly known as:  CLARITIN  Take 1 tablet (10 mg total) by mouth daily.     metoprolol succinate 100 MG 24 hr tablet  Commonly known as:  TOPROL-XL  Take 1 tablet by mouth 2 (two) times daily.     mirtazapine 7.5 MG tablet  Commonly known as:  REMERON  Take 1 tablet (7.5 mg total) by mouth at bedtime.     montelukast 10 MG tablet  Commonly known as:  SINGULAIR  Take 1 tablet (10 mg total) by mouth at bedtime.     multivitamin tablet  Take 1 tablet by mouth daily.     olopatadine 0.1 % ophthalmic solution  Commonly known as:  PATANOL  Place 1 drop into both eyes 2 (two) times daily.     rosuvastatin 10 MG tablet  Commonly known as:  CRESTOR  Take 1 tablet (10 mg total) by mouth daily.     sitaGLIPtin 100 MG tablet  Commonly known as:  JANUVIA  Take 1 tablet (100 mg total) by mouth daily.     vitamin B-12 500 MCG tablet  Commonly known as:  CYANOCOBALAMIN  Take 500 mcg by mouth daily.     VITAMIN B-6 PO  Take 1 tablet by mouth 2 (two) times daily.     Vitamin D3 1000 units Caps  Take by mouth daily.          Follow-up: Return in about 1 month (around 01/18/2016).  Claretta Fraise, M.D.

## 2016-01-04 LAB — HM DIABETES EYE EXAM

## 2016-01-24 ENCOUNTER — Encounter: Payer: Self-pay | Admitting: Family Medicine

## 2016-01-24 ENCOUNTER — Ambulatory Visit (INDEPENDENT_AMBULATORY_CARE_PROVIDER_SITE_OTHER): Payer: Medicare Other | Admitting: Family Medicine

## 2016-01-24 VITALS — BP 144/58 | HR 67 | Temp 97.4°F | Ht 63.0 in | Wt 165.6 lb

## 2016-01-24 DIAGNOSIS — E1165 Type 2 diabetes mellitus with hyperglycemia: Secondary | ICD-10-CM | POA: Diagnosis not present

## 2016-01-24 DIAGNOSIS — E785 Hyperlipidemia, unspecified: Secondary | ICD-10-CM | POA: Diagnosis not present

## 2016-01-24 DIAGNOSIS — I10 Essential (primary) hypertension: Secondary | ICD-10-CM | POA: Diagnosis not present

## 2016-01-24 DIAGNOSIS — E1142 Type 2 diabetes mellitus with diabetic polyneuropathy: Secondary | ICD-10-CM

## 2016-01-24 LAB — BAYER DCA HB A1C WAIVED: HB A1C (BAYER DCA - WAIVED): 9.3 % — ABNORMAL HIGH (ref <7.0)

## 2016-01-24 MED ORDER — PIOGLITAZONE HCL 15 MG PO TABS: 15.0000 mg | ORAL_TABLET | Freq: Every day | ORAL | Status: DC

## 2016-01-24 NOTE — Progress Notes (Signed)
Subjective:  Patient ID: Kristi Willis, female    DOB: 12-22-1940  Age: 75 y.o. MRN: [id number]  CC: Diabetes; Hyperlipidemia; and Hypertension   HPI Kristi Willis presents for  follow-up of hypertension. Patient has no history of headache chest pain or shortness of breath or recent cough. Patient also denies symptoms of TIA such as numbness weakness lateralizing. Patient checks  blood pressure at home and has not had any elevated readings recently. Patient denies side effects from his medication. States taking it regularly.  Patient also  in for follow-up of elevated cholesterol. Doing well without complaints on current medication. Denies side effects of statin including myalgia and arthralgia and nausea. Also in today for liver function testing. Currently no chest pain, shortness of breath or other cardiovascular related symptoms noted.  Follow-up of diabetes. Patient does occasionally check blood sugar at home. Readings run between 175 and 230. Patient denies symptoms such as polyuria, polydipsia, excessive hunger, nausea No significant hypoglycemic spells noted. She says she hasNeuropathy with burning. Pain last night. This was relieved by taking a single Tylenol. She says she is sleeping well now and taking the mirtazapine. Medications as noted below. Taking them regularly without complication/adverse reaction being reported today.    History Kristi Willis has a past medical history of SVT (supraventricular tachycardia) ([place]); Hypertension; Diabetes mellitus; Hypercholesteremia; Neuropathy ([place]); Asthma; GERD (gastroesophageal reflux disease); Cancer ([place]); Neuromuscular disorder ([place]); and Allergy.   She has past surgical history that includes Lumbar laminectomy (08/10); Spine surgery; Breast biopsy; and Skin cancer excision (2005).   Her family history includes Cancer in her father; Cancer (age of onset: 48) in her mother; Cancer (age of onset: 57) in her sister; Depression in  her mother; Heart disease in her father; Heart disease (age of onset: 104) in her sister; Hypertension in her sister; Stroke in her father.She reports that she has never smoked. She has never used smokeless tobacco. She reports that she does not drink alcohol or use illicit drugs.  Current Outpatient Prescriptions on File Prior to Visit  Medication Sig Dispense Refill  . amLODipine (NORVASC) 5 MG tablet Take 1 tablet by mouth daily.    . Calcium Carbonate-Vitamin D (CALTRATE 600+D) 600-400 MG-UNIT per tablet Take 1 tablet by mouth 2 (two) times daily.    . Cholecalciferol (VITAMIN D3) 1000 UNITS CAPS Take by mouth daily.      Marland Kitchen doxazosin (CARDURA) 8 MG tablet Take 1 tablet (8 mg total) by mouth daily. For blood pressure 90 tablet 3  . lisinopril (PRINIVIL,ZESTRIL) 40 MG tablet TAKE 1 TABLET DAILY 90 tablet 1  . metoprolol succinate (TOPROL-XL) 100 MG 24 hr tablet Take 1 tablet by mouth 2 (two) times daily.    . mirtazapine (REMERON) 7.5 MG tablet Take 1 tablet (7.5 mg total) by mouth at bedtime. 30 tablet 4  . Multiple Vitamin (MULTIVITAMIN) tablet Take 1 tablet by mouth daily.      . rosuvastatin (CRESTOR) 10 MG tablet Take 1 tablet (10 mg total) by mouth daily. 90 tablet 3  . sitaGLIPtin (JANUVIA) 100 MG tablet Take 1 tablet (100 mg total) by mouth daily. 30 tablet 2  . vitamin B-12 (CYANOCOBALAMIN) 500 MCG tablet Take 500 mcg by mouth daily.      Marland Kitchen acyclovir ointment (ZOVIRAX) 5 % Apply 1 application topically every 3 (three) hours. Around mouth, Until symptoms clear (Patient not taking: Reported on 01/24/2016) 15 g 5  . albuterol (VENTOLIN HFA) 108 (90 BASE) MCG/ACT inhaler Inhale 2  puffs into the lungs every 6 (six) hours as needed for wheezing. (Patient not taking: Reported on 01/24/2016) 1 Inhaler 3  . azelastine (ASTELIN) 0.1 % nasal spray Place 2 sprays into both nostrils 2 (two) times daily. (Patient not taking: Reported on 01/24/2016) 30 mL 11  . EPIPEN 2-PAK 0.3 MG/0.3ML SOAJ injection  Reported on 01/24/2016    . loratadine (CLARITIN) 10 MG tablet Take 1 tablet (10 mg total) by mouth daily. (Patient not taking: Reported on 01/24/2016) 30 tablet 11  . montelukast (SINGULAIR) 10 MG tablet Take 1 tablet (10 mg total) by mouth at bedtime. (Patient not taking: Reported on 01/24/2016) 30 tablet 5  . Pyridoxine HCl (VITAMIN B-6 PO) Take 1 tablet by mouth 2 (two) times daily. Reported on 01/24/2016     No current facility-administered medications on file prior to visit.    ROS Review of Systems  Constitutional: Negative for fever, activity change and appetite change.  HENT: Negative for congestion, rhinorrhea and sore throat.   Eyes: Negative for visual disturbance.  Respiratory: Negative for cough and shortness of breath.   Cardiovascular: Negative for chest pain and palpitations.  Gastrointestinal: Negative for nausea, abdominal pain and diarrhea.  Genitourinary: Negative for dysuria.  Musculoskeletal: Negative for myalgias and arthralgias.    Objective:  BP 144/58 mmHg  Pulse 67  Temp(Src) 97.4 F (36.3 C) (Oral)  Ht '5\' 3"'  (1.6 m)  Wt 165 lb 9.6 oz (75.116 kg)  BMI 29.34 kg/m2  SpO2 97%  BP Readings from Last 3 Encounters:  01/24/16 144/58  12/18/15 142/57  11/09/15 144/65    Wt Readings from Last 3 Encounters:  01/24/16 165 lb 9.6 oz (75.116 kg)  12/18/15 170 lb (77.111 kg)  11/09/15 168 lb 6.4 oz (76.386 kg)     Physical Exam  Constitutional: She is oriented to person, place, and time. She appears well-developed and well-nourished. No distress.  HENT:  Head: Normocephalic and atraumatic.  Right Ear: External ear normal.  Left Ear: External ear normal.  Nose: Nose normal.  Mouth/Throat: Oropharynx is clear and moist.  Eyes: Conjunctivae and EOM are normal. Pupils are equal, round, and reactive to light.  Neck: Normal range of motion. Neck supple. No thyromegaly present.  Cardiovascular: Normal rate, regular rhythm and normal heart sounds.   No murmur  heard. Pulmonary/Chest: Effort normal and breath sounds normal. No respiratory distress. She has no wheezes. She has no rales.  Abdominal: Soft. Bowel sounds are normal. She exhibits no distension. There is no tenderness.  Lymphadenopathy:    She has no cervical adenopathy.  Neurological: She is alert and oriented to person, place, and time. She has normal reflexes.  Skin: Skin is warm and dry.  Psychiatric: She has a normal mood and affect. Her behavior is normal. Judgment and thought content normal.   Diabetic Foot Form - Detailed   Diabetic Foot Exam - detailed  Is there a history of foot ulcer?:  No  Can the patient see the bottom of their feet?:  Yes  Are the shoes appropriate in style and fit?:  Yes  Is there swelling or and abnormal foot shape?:  No  Are the toenails long?:  No  Are the toenails thick?:  No  Do you have pain in calf while walking?:  No  Is there a claw toe deformity?:  No  Is there elevated skin temparature?:  No  Is there limited skin dorsiflexion?:  No  Is there foot or ankle muscle weakness?:  No  Are the toenails ingrown?:  No  Normal Range of Motion:  Yes    Right posterior Tibialias:  Present Left posterior Tibialias:  Present  Right Dorsalis Pedis:  Present Left Dorsalis Pedis:  Present  Semmes-Weinstein Monofilament Test  R Foot Test Control:  Pos L Foot Test Control:  Pos  R Site 1-Great Toe:  Neg L Site 1-Great Toe:  Neg  R Site 4:  Neg L Site 4:  Neg  R Site 5:  Neg L Site 5:  Neg         Lab Results  Component Value Date   HGBA1C 8.9 10/13/2015   HGBA1C 7.9 07/18/2015   HGBA1C 7.7 04/14/2015    Lab Results  Component Value Date   WBC 7.0 12/07/2015   HGB 12.9 01/12/2015   HCT 37.8 12/07/2015   PLT 217 12/07/2015   GLUCOSE 248* 12/07/2015   CHOL 184 12/07/2015   TRIG 207* 12/07/2015   HDL 51 12/07/2015   LDLCALC 92 12/07/2015   ALT 34* 12/07/2015   AST 58* 12/07/2015   NA 139 12/07/2015   K 4.3 12/07/2015   CL 96 12/07/2015    CREATININE 0.54* 12/07/2015   BUN 11 12/07/2015   CO2 23 12/07/2015   TSH 3.340 01/12/2015   INR 1.0 04/11/2009   HGBA1C 8.9 10/13/2015   MICROALBUR 1.45 11/25/2012    US Breast Ltd Uni Left Inc [place]  10/12/2015  CLINICAL DATA:  Six-month follow-up status post left breast biopsy with pathology results demonstrating fibrocystic change with focal sclerosing adenosis and associated microcalcifications. EXAM: DIGITAL DIAGNOSTIC LEFT MAMMOGRAM WITH 3D TOMOSYNTHESIS AND CAD LEFT BREAST ULTRASOUND COMPARISON:  Previous exam(s). ACR Breast Density Category c: The breast tissue is heterogeneously dense, which may obscure small masses. FINDINGS: The coil shaped biopsy marking clip is again identified in the inferior left breast, middle depth. No suspicious calcifications, masses or areas of distortion are seen in the left breast. Mammographic images were processed with CAD. Physical exam of the inferior left breast demonstrates normal fibroglandular tissue without discrete palpable mass. Ultrasound targeted to the inferior left breast demonstrates ill-defined shadowing areas, similar to the prior ultrasound. There is no significant interval change. IMPRESSION: No significant interval change in the appearance of the tissue in the inferior left breast at site of prior ultrasound-guided biopsy. RECOMMENDATION: Return to screening mammography is recommended. The patient will be due for screening in June of 2017. I have discussed the findings and recommendations with the patient. Results were also provided in writing at the conclusion of the visit. If applicable, a reminder letter will be sent to the patient regarding the next appointment. BI-RADS CATEGORY  2: Benign. Electronically Signed   By: Ammie Ferrier M.D.   On: 10/12/2015 13:49   Mm Diag Breast Tomo Uni Left  10/12/2015  CLINICAL DATA:  Six-month follow-up status post left breast biopsy with pathology results demonstrating fibrocystic change with  focal sclerosing adenosis and associated microcalcifications. EXAM: DIGITAL DIAGNOSTIC LEFT MAMMOGRAM WITH 3D TOMOSYNTHESIS AND CAD LEFT BREAST ULTRASOUND COMPARISON:  Previous exam(s). ACR Breast Density Category c: The breast tissue is heterogeneously dense, which may obscure small masses. FINDINGS: The coil shaped biopsy marking clip is again identified in the inferior left breast, middle depth. No suspicious calcifications, masses or areas of distortion are seen in the left breast. Mammographic images were processed with CAD. Physical exam of the inferior left breast demonstrates normal fibroglandular tissue without discrete palpable mass. Ultrasound targeted to the inferior left breast demonstrates  ill-defined shadowing areas, similar to the prior ultrasound. There is no significant interval change. IMPRESSION: No significant interval change in the appearance of the tissue in the inferior left breast at site of prior ultrasound-guided biopsy. RECOMMENDATION: Return to screening mammography is recommended. The patient will be due for screening in June of 2017. I have discussed the findings and recommendations with the patient. Results were also provided in writing at the conclusion of the visit. If applicable, a reminder letter will be sent to the patient regarding the next appointment. BI-RADS CATEGORY  2: Benign. Electronically Signed   By: Ammie Ferrier M.D.   On: 10/12/2015 13:49    Assessment & Plan:   Kamylle was seen today for diabetes, hyperlipidemia and hypertension.  Diagnoses and all orders for this visit:  Hyperlipidemia -     CMP14+EGFR -     Lipid panel  Type 2 diabetes mellitus with hyperglycemia, without long-term current use of insulin ([facility]) -     Bayer DCA Hb A1c Waived -     CBC with Differential/Platelet -     CMP14+EGFR -     Microalbumin / creatinine urine ratio  Essential hypertension -     CMP14+EGFR  Diabetic polyneuropathy associated with type 2 diabetes  mellitus ([place])  Other orders -     pioglitazone (ACTOS) 15 MG tablet; Take 1 tablet (15 mg total) by mouth daily.   I have discontinued Ms. Liptak's olopatadine. I am also having her start on pioglitazone. Additionally, I am having her maintain her vitamin B-12, multivitamin, Vitamin D3, Calcium Carbonate-Vitamin D, Pyridoxine HCl (VITAMIN B-6 PO), EPIPEN 2-PAK, azelastine, loratadine, montelukast, rosuvastatin, albuterol, acyclovir ointment, doxazosin, lisinopril, amLODipine, metoprolol succinate, mirtazapine, and sitaGLIPtin.  Meds ordered this encounter  Medications  . pioglitazone (ACTOS) 15 MG tablet    Sig: Take 1 tablet (15 mg total) by mouth daily.    Dispense:  30 tablet    Refill:  3   Nutrition consult ordered  Follow-up: Return in about 3 months (around 04/25/2016).  Kristi Willis, M.D.

## 2016-01-25 LAB — CBC WITH DIFFERENTIAL/PLATELET
BASOS ABS: 0 10*3/uL (ref 0.0–0.2)
Basos: 0 %
EOS (ABSOLUTE): 0.4 10*3/uL (ref 0.0–0.4)
EOS: 6 %
HEMATOCRIT: 37.8 % (ref 34.0–46.6)
HEMOGLOBIN: 12.2 g/dL (ref 11.1–15.9)
IMMATURE GRANULOCYTES: 0 %
Immature Grans (Abs): 0 10*3/uL (ref 0.0–0.1)
Lymphocytes Absolute: 2 10*3/uL (ref 0.7–3.1)
Lymphs: 34 %
MCH: 28.4 pg (ref 26.6–33.0)
MCHC: 32.3 g/dL (ref 31.5–35.7)
MCV: 88 fL (ref 79–97)
MONOCYTES: 10 %
Monocytes Absolute: 0.6 10*3/uL (ref 0.1–0.9)
Neutrophils Absolute: 2.9 10*3/uL (ref 1.4–7.0)
Neutrophils: 50 %
Platelets: 195 10*3/uL (ref 150–379)
RBC: 4.29 x10E6/uL (ref 3.77–5.28)
RDW: 13.6 % (ref 12.3–15.4)
WBC: 6 10*3/uL (ref 3.4–10.8)

## 2016-01-25 LAB — MICROALBUMIN / CREATININE URINE RATIO
Creatinine, Urine: 115.5 mg/dL
MICROALB/CREAT RATIO: 7.1 mg/g creat (ref 0.0–30.0)
MICROALBUM., U, RANDOM: 8.2 ug/mL

## 2016-01-25 LAB — CMP14+EGFR
ALBUMIN: 4.2 g/dL (ref 3.5–4.8)
ALK PHOS: 66 IU/L (ref 39–117)
ALT: 35 IU/L — ABNORMAL HIGH (ref 0–32)
AST: 51 IU/L — AB (ref 0–40)
Albumin/Globulin Ratio: 1.8 (ref 1.2–2.2)
BUN/Creatinine Ratio: 29 — ABNORMAL HIGH (ref 12–28)
BUN: 17 mg/dL (ref 8–27)
Bilirubin Total: 0.4 mg/dL (ref 0.0–1.2)
CALCIUM: 9 mg/dL (ref 8.7–10.3)
CO2: 22 mmol/L (ref 18–29)
CREATININE: 0.58 mg/dL (ref 0.57–1.00)
Chloride: 97 mmol/L (ref 96–106)
GFR calc Af Amer: 105 mL/min/{1.73_m2} (ref >59)
GFR, EST NON AFRICAN AMERICAN: 91 mL/min/{1.73_m2} (ref >59)
GLOBULIN, TOTAL: 2.3 g/dL (ref 1.5–4.5)
GLUCOSE: 225 mg/dL — AB (ref 65–99)
Potassium: 4.3 mmol/L (ref 3.5–5.2)
SODIUM: 139 mmol/L (ref 134–144)
Total Protein: 6.5 g/dL (ref 6.0–8.5)

## 2016-01-25 LAB — LIPID PANEL
CHOLESTEROL TOTAL: 149 mg/dL (ref 100–199)
Chol/HDL Ratio: 3.7 ratio units (ref 0.0–4.4)
HDL: 40 mg/dL (ref >39)
LDL Calculated: 59 mg/dL (ref 0–99)
Triglycerides: 251 mg/dL — ABNORMAL HIGH (ref 0–149)
VLDL Cholesterol Cal: 50 mg/dL — ABNORMAL HIGH (ref 5–40)

## 2016-01-30 ENCOUNTER — Ambulatory Visit: Payer: Medicare Other | Admitting: Pharmacist

## 2016-02-04 ENCOUNTER — Emergency Department (HOSPITAL_COMMUNITY)
Admission: EM | Admit: 2016-02-04 | Discharge: 2016-02-04 | Disposition: A | Discharge status: Home / Self Care | Payer: Medicare Other | Attending: Emergency Medicine | Admitting: Emergency Medicine

## 2016-02-04 ENCOUNTER — Encounter (HOSPITAL_COMMUNITY): Payer: Self-pay

## 2016-02-04 ENCOUNTER — Emergency Department (HOSPITAL_COMMUNITY): Payer: Medicare Other

## 2016-02-04 DIAGNOSIS — I10 Essential (primary) hypertension: Secondary | ICD-10-CM | POA: Insufficient documentation

## 2016-02-04 DIAGNOSIS — Z7984 Long term (current) use of oral hypoglycemic drugs: Secondary | ICD-10-CM | POA: Diagnosis not present

## 2016-02-04 DIAGNOSIS — Z85828 Personal history of other malignant neoplasm of skin: Secondary | ICD-10-CM | POA: Insufficient documentation

## 2016-02-04 DIAGNOSIS — J45909 Unspecified asthma, uncomplicated: Secondary | ICD-10-CM | POA: Diagnosis not present

## 2016-02-04 DIAGNOSIS — E1140 Type 2 diabetes mellitus with diabetic neuropathy, unspecified: Secondary | ICD-10-CM | POA: Insufficient documentation

## 2016-02-04 DIAGNOSIS — M79672 Pain in left foot: Secondary | ICD-10-CM | POA: Diagnosis not present

## 2016-02-04 DIAGNOSIS — Z79899 Other long term (current) drug therapy: Secondary | ICD-10-CM | POA: Diagnosis not present

## 2016-02-04 LAB — CBC WITH DIFFERENTIAL/PLATELET
BASOS ABS: 0 10*3/uL (ref 0.0–0.1)
BASOS PCT: 0 %
EOS ABS: 0.5 10*3/uL (ref 0.0–0.7)
Eosinophils Relative: 7 %
HEMATOCRIT: 34.7 % — AB (ref 36.0–46.0)
HEMOGLOBIN: 11.1 g/dL — AB (ref 12.0–15.0)
Lymphocytes Relative: 41 %
Lymphs Abs: 2.8 10*3/uL (ref 0.7–4.0)
MCH: 27.5 pg (ref 26.0–34.0)
MCHC: 32 g/dL (ref 30.0–36.0)
MCV: 86.1 fL (ref 78.0–100.0)
MONOS PCT: 9 %
Monocytes Absolute: 0.6 10*3/uL (ref 0.1–1.0)
NEUTROS PCT: 43 %
Neutro Abs: 3 10*3/uL (ref 1.7–7.7)
Platelets: 181 10*3/uL (ref 150–400)
RBC: 4.03 MIL/uL (ref 3.87–5.11)
RDW: 13.2 % (ref 11.5–15.5)
WBC: 6.8 10*3/uL (ref 4.0–10.5)

## 2016-02-04 LAB — CBG MONITORING, ED: Glucose-Capillary: 227 mg/dL — ABNORMAL HIGH (ref 65–99)

## 2016-02-04 LAB — BASIC METABOLIC PANEL
ANION GAP: 9 (ref 5–15)
BUN: 13 mg/dL (ref 6–20)
CO2: 25 mmol/L (ref 22–32)
Calcium: 9.2 mg/dL (ref 8.9–10.3)
Chloride: 101 mmol/L (ref 101–111)
Creatinine, Ser: 0.61 mg/dL (ref 0.44–1.00)
GLUCOSE: 187 mg/dL — AB (ref 65–99)
POTASSIUM: 3.8 mmol/L (ref 3.5–5.1)
Sodium: 135 mmol/L (ref 135–145)

## 2016-02-04 MED ORDER — HYDROCODONE-ACETAMINOPHEN 5-325 MG PO TABS
1.0000 | ORAL_TABLET | Freq: Once | ORAL | Status: AC
  Administered 2016-02-04: 1 via ORAL
  Filled 2016-02-04: qty 1

## 2016-02-04 NOTE — ED Notes (Signed)
Patient complains of left lateral foot pain since 1130am. Denies injury. Has neuropathy but this is different pain per patient. No swelling noted, no redness. Positive distal pulses

## 2016-02-04 NOTE — Discharge Instructions (Signed)
IMPORTANT PATIENT INSTRUCTIONS:  You have been scheduled for an Outpatient Vascular Study at [facility].    If tomorrow is a Saturday or Sunday, please go to the [facility] Emergency Department Registration Desk at 8 am tomorrow morning and tell them you are there for a vascular study.  If tomorrow is a weekday (Monday-Friday), please go to [facility] Admitting Department at 8 am and tell them you are  there for a vascular study.  The x-ray of your foot was normal. Your blood only shows mild anemia. Return for worsening symptoms, including fever, worsening pain or swelling, or any other symptoms concerning to you.   Pain Without a Known Cause WHAT IS PAIN WITHOUT A KNOWN CAUSE? Pain can occur in any part of the body and can range from mild to severe. Sometimes no cause can be found for why you are having pain. Some types of pain that can occur without a known cause include:   Headache.  Back pain.  Abdominal pain.  Neck pain. HOW IS PAIN WITHOUT A KNOWN CAUSE DIAGNOSED?  Your health care provider will try to find the cause of your pain. This may include:  Physical exam.  Medical history.  Blood tests.  Urine tests.  X-rays. If no cause is found, your health care provider may diagnose you with pain without a known cause.  IS THERE TREATMENT FOR PAIN WITHOUT A CAUSE?  Treatment depends on the kind of pain you have. Your health care provider may prescribe medicines to help relieve your pain.  WHAT CAN I DO AT HOME FOR MY PAIN?   Take medicines only as directed by your health care provider.  Stop any activities that cause pain. During periods of severe pain, bed rest may help.  Try to reduce your stress with activities such as yoga or meditation. Talk to your health care provider for other stress-reducing activity recommendations.  Exercise regularly, if approved by your health care provider.  Eat a healthy diet that includes fruits and vegetables. This may improve  pain. Talk to your health care provider if you have any questions about your diet. WHAT IF MY PAIN DOES NOT GET BETTER?  If you have a painful condition and no reason can be found for the pain or the pain gets worse, it is important to follow up with your health care provider. It may be necessary to repeat tests and look further for a possible cause.    This information is not intended to replace advice given to you by your health care provider. Make sure you discuss any questions you have with your health care provider.   Document Released: 04/30/2001 Document Revised: 08/26/2014 Document Reviewed: 12/21/2013 Elsevier Interactive Patient Education [employer].

## 2016-02-04 NOTE — ED Notes (Signed)
Pt departed in NAD, refused use of wheelchair.  

## 2016-02-04 NOTE — ED Provider Notes (Signed)
CSN: [id number]     Arrival date & time 02/04/16  1806 History   First MD Initiated Contact with Patient 02/04/16 1825     Chief Complaint  Patient presents with  . Foot Pain     (Consider location/radiation/quality/duration/timing/severity/associated sxs/prior Treatment) HPI 75 year old female who presents with left foot pain. She has a history of diabetes complicated by diabetic neuropathy, hyperlipidemia, and asthma. States that after going to church today she had throbbing pain over her second through third toe without associating trauma. States that pain seems to be slightly improved with ambulation, not worse with palpation. No associating new skin changes. With chronic numbness in her bilateral lower extremities due to diabetic neuropathy and this is unchanged. Her daughter was concerned about a DVT given that she rates this week and recently underwent a long car ride. Patient may be of noted mild swelling in the left leg. No chest pain or difficulty breathing, fevers or chills, abdominal pain, nausea or vomiting.  Past Medical History  Diagnosis Date  . SVT (supraventricular tachycardia) ([place])   . Hypertension   . Diabetes mellitus   . Hypercholesteremia   . Neuropathy ([place])   . Asthma   . GERD (gastroesophageal reflux disease)     hiatal hernia  . Cancer ([place])     skin  . Neuromuscular disorder ([facility])     DM neuropathy  . Allergy    Past Surgical History  Procedure Laterality Date  . Lumbar laminectomy  08/10  . Spine surgery      lumbar laminectomy  . Breast biopsy      left breast biopsy  . Skin cancer excision  2005   Family History  Problem Relation Age of Onset  . Cancer Mother 36    cervical cancer  . Depression Mother   . Heart disease Father   . Stroke Father   . Cancer Father     lung cancer  . Heart disease Sister 43    MI  . Hypertension Sister   . Cancer Sister 52    breast cancer   Social History  Substance Use Topics  . Smoking status: Never  Smoker   . Smokeless tobacco: Never Used  . Alcohol Use: No   OB History    No data available     Review of Systems 10/14 systems reviewed and are negative other than those stated in the HPI    Allergies  Dimetapp c; Phenylephrine hcl; Ultram; Celebrex; Cortisone; Fenofibrate; and Sulfa antibiotics  Home Medications   Prior to Admission medications   Medication Sig Start Date End Date Taking? Authorizing Provider  acyclovir ointment (ZOVIRAX) 5 % Apply 1 application topically every 3 (three) hours. Around mouth, Until symptoms clear Patient not taking: Reported on 01/24/2016 07/19/15   Claretta Fraise, MD  albuterol (VENTOLIN HFA) 108 (90 BASE) MCG/ACT inhaler Inhale 2 puffs into the lungs every 6 (six) hours as needed for wheezing. Patient not taking: Reported on 01/24/2016 04/14/15   Claretta Fraise, MD  amLODipine (NORVASC) 5 MG tablet Take 1 tablet by mouth daily. 09/04/15   Historical Provider, MD  azelastine (ASTELIN) 0.1 % nasal spray Place 2 sprays into both nostrils 2 (two) times daily. Patient not taking: Reported on 01/24/2016 08/24/14   Lysbeth Penner, FNP  Calcium Carbonate-Vitamin D (CALTRATE 600+D) 600-400 MG-UNIT per tablet Take 1 tablet by mouth 2 (two) times daily.    Historical Provider, MD  Cholecalciferol (VITAMIN D3) 1000 UNITS CAPS Take by mouth daily.  Historical Provider, MD  doxazosin (CARDURA) 8 MG tablet Take 1 tablet (8 mg total) by mouth daily. For blood pressure 10/23/15   Claretta Fraise, MD  EPIPEN 2-PAK 0.3 MG/0.3ML SOAJ injection Reported on 01/24/2016 03/07/14   Historical Provider, MD  lisinopril (PRINIVIL,ZESTRIL) 40 MG tablet TAKE 1 TABLET DAILY 10/27/15   Claretta Fraise, MD  loratadine (CLARITIN) 10 MG tablet Take 1 tablet (10 mg total) by mouth daily. Patient not taking: Reported on 01/24/2016 08/24/14   Lysbeth Penner, FNP  metoprolol succinate (TOPROL-XL) 100 MG 24 hr tablet Take 1 tablet by mouth 2 (two) times daily. 09/04/15   Historical Provider, MD   mirtazapine (REMERON) 7.5 MG tablet Take 1 tablet (7.5 mg total) by mouth at bedtime. 12/07/15   Claretta Fraise, MD  montelukast (SINGULAIR) 10 MG tablet Take 1 tablet (10 mg total) by mouth at bedtime. Patient not taking: Reported on 01/24/2016 04/14/15   Claretta Fraise, MD  Multiple Vitamin (MULTIVITAMIN) tablet Take 1 tablet by mouth daily.      Historical Provider, MD  pioglitazone (ACTOS) 15 MG tablet Take 1 tablet (15 mg total) by mouth daily. 01/24/16   Claretta Fraise, MD  Pyridoxine HCl (VITAMIN B-6 PO) Take 1 tablet by mouth 2 (two) times daily. Reported on 01/24/2016    Historical Provider, MD  rosuvastatin (CRESTOR) 10 MG tablet Take 1 tablet (10 mg total) by mouth daily. 04/14/15   Claretta Fraise, MD  sitaGLIPtin (JANUVIA) 100 MG tablet Take 1 tablet (100 mg total) by mouth daily. 12/18/15   Claretta Fraise, MD  vitamin B-12 (CYANOCOBALAMIN) 500 MCG tablet Take 500 mcg by mouth daily.      Historical Provider, MD   BP 130/53 mmHg  Pulse 65  Temp(Src) 98.3 F (36.8 C) (Oral)  Resp 18  SpO2 96% Physical Exam Physical Exam  Nursing note and vitals reviewed. Constitutional: Well developed, well nourished, non-toxic, and in no acute distress Head: Normocephalic and atraumatic.  Mouth/Throat: Moist mucous membranes Neck:  Neck supple.  Cardiovascular: Normal rate and regular rhythm.  +2 DP pulse in LLE Pulmonary/Chest: Effort normal and breath sounds normal.  Abdominal: Soft. There is no tenderness. There is no rebound and no guarding.  Musculoskeletal: Normal range of motion.  Neurological: Alert, no facial droop, fluent speech, moves all extremities symmetrically, full strength ankle dorsi and plantar flexion, sensation diminished in bilateral lower extremities below the knee due to diabetic neuropathy Skin: Skin is warm and dry.  Psychiatric: Cooperative  ED Course  Procedures (including critical care time) Labs Review Labs Reviewed  CBC WITH DIFFERENTIAL/PLATELET - Abnormal; Notable for  the following:    Hemoglobin 11.1 (*)    HCT 34.7 (*)    All other components within normal limits  BASIC METABOLIC PANEL - Abnormal; Notable for the following:    Glucose, Bld 187 (*)    All other components within normal limits  CBG MONITORING, ED - Abnormal; Notable for the following:    Glucose-Capillary 227 (*)    All other components within normal limits    Imaging Review Dg Foot Complete Left  02/04/2016  CLINICAL DATA:  Pain in the second through fifth toes that began at 11 a.m. this morning. Diabetic patient. No known injury. Initial encounter. EXAM: LEFT FOOT - COMPLETE 3+ VIEW COMPARISON:  None. FINDINGS: There is no evidence of fracture or dislocation. There is no evidence of arthropathy or other focal bone abnormality. Plantar calcaneal spur is incidentally noted. Soft tissues are unremarkable. IMPRESSION: Negative exam.  Electronically Signed   By: Inge Rise M.D.   On: 02/04/2016 19:34   I have personally reviewed and evaluated these images and lab results as part of my medical decision-making.   EKG Interpretation None      MDM   Final diagnoses:  Left foot pain    75 year old female who presents with left foot pain. Nontoxic in no acute distress with normal vital signs on presentation. Her left foot appears normal on exam. No reproducible tenderness to palpation of the second through third or fourth digits where she states that she was having pain. There is no evidence of overlying skin changes, swelling, or infection. It is neurovascularly intact. With baseline numbness due to diabetic neuropathy. Other she feels that there is mild swelling of the left calf I do not appreciate this on exam. X-ray of her foot is normal and basic blood work otherwise unremarkable aside from mild anemia with a hemoglobin of 11. Her daughter is very concerned about an acute DVT in the setting of her recent long distance travel. I have ordered her an outpatient ultrasound of her lower  extremity. Strict return and follow-up instructions reviewed. She expressed understanding of all discharge instructions and felt comfortable with the plan of care.     Forde Dandy, MD 02/04/16 2109

## 2016-02-05 ENCOUNTER — Ambulatory Visit (HOSPITAL_COMMUNITY)
Admission: RE | Admit: 2016-02-05 | Discharge: 2016-02-05 | Disposition: A | Discharge status: Home / Self Care | Payer: Medicare Other | Source: Ambulatory Visit | Attending: Emergency Medicine | Admitting: Emergency Medicine

## 2016-02-05 DIAGNOSIS — E7800 Pure hypercholesterolemia, unspecified: Secondary | ICD-10-CM | POA: Diagnosis not present

## 2016-02-05 DIAGNOSIS — I10 Essential (primary) hypertension: Secondary | ICD-10-CM | POA: Insufficient documentation

## 2016-02-05 DIAGNOSIS — K219 Gastro-esophageal reflux disease without esophagitis: Secondary | ICD-10-CM | POA: Diagnosis not present

## 2016-02-05 DIAGNOSIS — E1142 Type 2 diabetes mellitus with diabetic polyneuropathy: Secondary | ICD-10-CM | POA: Insufficient documentation

## 2016-02-05 DIAGNOSIS — M79609 Pain in unspecified limb: Secondary | ICD-10-CM | POA: Diagnosis not present

## 2016-02-05 DIAGNOSIS — M7989 Other specified soft tissue disorders: Secondary | ICD-10-CM | POA: Diagnosis not present

## 2016-02-05 DIAGNOSIS — M79672 Pain in left foot: Secondary | ICD-10-CM | POA: Diagnosis not present

## 2016-02-05 NOTE — Progress Notes (Signed)
Preliminary results by tech - Left Lower Ext. Venous Duplex Completed. Negative for deep and superficial vein thrombosis.  Christyne Mccain, BS, RDMS, RVT  

## 2016-02-29 ENCOUNTER — Other Ambulatory Visit: Payer: Self-pay | Admitting: Family Medicine

## 2016-03-08 ENCOUNTER — Other Ambulatory Visit: Payer: Self-pay | Admitting: Family Medicine

## 2016-03-16 ENCOUNTER — Other Ambulatory Visit: Payer: Self-pay | Admitting: Family Medicine

## 2016-03-26 ENCOUNTER — Telehealth: Payer: Self-pay | Admitting: Family Medicine

## 2016-03-26 NOTE — Telephone Encounter (Signed)
appt scheduled

## 2016-03-29 ENCOUNTER — Ambulatory Visit (INDEPENDENT_AMBULATORY_CARE_PROVIDER_SITE_OTHER): Payer: Medicare Other | Admitting: Family Medicine

## 2016-03-29 ENCOUNTER — Encounter: Payer: Self-pay | Admitting: Family Medicine

## 2016-03-29 VITALS — BP 130/75 | HR 62 | Temp 97.3°F | Ht 63.0 in | Wt 167.2 lb

## 2016-03-29 DIAGNOSIS — F418 Other specified anxiety disorders: Secondary | ICD-10-CM | POA: Diagnosis not present

## 2016-03-29 DIAGNOSIS — G5790 Unspecified mononeuropathy of unspecified lower limb: Secondary | ICD-10-CM

## 2016-03-29 DIAGNOSIS — G4700 Insomnia, unspecified: Secondary | ICD-10-CM

## 2016-03-29 DIAGNOSIS — F32A Depression, unspecified: Secondary | ICD-10-CM | POA: Insufficient documentation

## 2016-03-29 DIAGNOSIS — M792 Neuralgia and neuritis, unspecified: Secondary | ICD-10-CM

## 2016-03-29 DIAGNOSIS — F329 Major depressive disorder, single episode, unspecified: Secondary | ICD-10-CM | POA: Insufficient documentation

## 2016-03-29 DIAGNOSIS — F419 Anxiety disorder, unspecified: Secondary | ICD-10-CM

## 2016-03-29 MED ORDER — SITAGLIPTIN PHOSPHATE 100 MG PO TABS: 100.0000 mg | ORAL_TABLET | Freq: Every day | ORAL | 2 refills | Status: DC

## 2016-03-29 MED ORDER — DULOXETINE HCL 30 MG PO CPEP: 30.0000 mg | ORAL_CAPSULE | Freq: Every day | ORAL | 1 refills | Status: DC

## 2016-03-29 MED ORDER — ACYCLOVIR 5 % EX OINT: 1.0000 "application " | TOPICAL_OINTMENT | CUTANEOUS | 5 refills | Status: DC

## 2016-03-29 NOTE — Progress Notes (Signed)
BP 130/75 (BP Location: Left Arm, Patient Position: Sitting, Cuff Size: Normal)   Pulse 62   Temp 97.3 F (36.3 C) (Oral)   Ht _0  (1.6 m)   Wt 167 lb 3.2 oz (75.8 kg)   BMI 29.62 kg/m    Subjective:    Patient ID: Kristi Willis, female    DOB: 02-Jun-1941, 75 y.o.   MRN: [id number]  HPI: Kristi Willis is a 75 y.o. female presenting on 03/29/2016 for Discuss medications (patient would like to discuss the need for all the medications she is taking, she also does not like the Remeron; is concerned about her speech - reports that she knows what she wants to say but has difficulty getting her words and throughts out); Referral to endocrinologist; and Bilateral foot pain (patient said she used to do a lot of walking but that now she is unable to do so because of her foot pain)   HPI Insomnia and nerve pain in her foot and anxiety and depression Patient is coming in for insomnia and foot nerve pain and anxiety and depression. She was on other medications previously but she would like to try something besides the Remeron that she was on. She says the Remeron is not helping as much. She denies any feelings of suicidal ideations or thoughts of hurting herself. She does have feelings of both sadness and anxiety intermittently and mood swings also her daughter is providing some of the history of this. She also has slowed mentation and difficulty getting her words out and her daughter thinks this may be due to her depression. We discussed the possibility of it being neurological and they would like to try the antidepressant first and if not improved then go see a neurologist at that point.  Relevant past medical, surgical, family and social history reviewed and updated as indicated. Interim medical history since our last visit reviewed. Allergies and medications reviewed and updated.  Review of Systems  Constitutional: Positive for fatigue. Negative for chills and fever.  HENT: Negative  for congestion, ear discharge and ear pain.   Eyes: Negative for redness and visual disturbance.  Respiratory: Negative for chest tightness and shortness of breath.   Cardiovascular: Negative for chest pain and leg swelling.  Genitourinary: Negative for difficulty urinating and dysuria.  Musculoskeletal: Negative for back pain and gait problem.  Skin: Negative for rash.  Neurological: Positive for numbness. Negative for dizziness, weakness, light-headedness and headaches.  Psychiatric/Behavioral: Positive for dysphoric mood and sleep disturbance. Negative for agitation, behavioral problems, self-injury and suicidal ideas. The patient is nervous/anxious.   All other systems reviewed and are negative.   Per HPI unless specifically indicated above     Medication List       Accurate as of 03/29/16 12:05 PM. Always use your most recent med list.          ACCU-CHEK AVIVA PLUS w/Device Kit CHECK BLOOD SUGAR UP TO 4 TIMES A DAY   acyclovir ointment 5 % Commonly known as:  ZOVIRAX Apply 1 application topically every 3 (three) hours. Around mouth, Until symptoms clear   albuterol 108 (90 Base) MCG/ACT inhaler Commonly known as:  VENTOLIN HFA Inhale 2 puffs into the lungs every 6 (six) hours as needed for wheezing.   amLODipine 5 MG tablet Commonly known as:  NORVASC Take 1 tablet by mouth daily.   azelastine 0.1 % nasal spray Commonly known as:  ASTELIN Place 2 sprays into both nostrils 2 (two) times daily.  CALTRATE 600+D 600-400 MG-UNIT tablet Generic drug:  Calcium Carbonate-Vitamin D Take 1 tablet by mouth 2 (two) times daily.   doxazosin 8 MG tablet Commonly known as:  CARDURA Take 1 tablet (8 mg total) by mouth daily. For blood pressure   DULoxetine 30 MG capsule Commonly known as:  CYMBALTA Take 1 capsule (30 mg total) by mouth daily.   EPIPEN 2-PAK 0.3 mg/0.3 mL Soaj injection Generic drug:  EPINEPHrine Reported on 01/24/2016   lisinopril 40 MG tablet Commonly  known as:  PRINIVIL,ZESTRIL TAKE 1 TABLET DAILY   loratadine 10 MG tablet Commonly known as:  CLARITIN Take 1 tablet (10 mg total) by mouth daily.   metoprolol succinate 100 MG 24 hr tablet Commonly known as:  TOPROL-XL TAKE  (1)  TABLET TWICE A DAY.   montelukast 10 MG tablet Commonly known as:  SINGULAIR Take 1 tablet (10 mg total) by mouth at bedtime.   multivitamin tablet Take 1 tablet by mouth daily.   pioglitazone 15 MG tablet Commonly known as:  ACTOS Take 1 tablet (15 mg total) by mouth daily.   rosuvastatin 10 MG tablet Commonly known as:  CRESTOR Take 1 tablet (10 mg total) by mouth daily.   sitaGLIPtin 100 MG tablet Commonly known as:  JANUVIA Take 1 tablet (100 mg total) by mouth daily.   vitamin B-12 500 MCG tablet Commonly known as:  CYANOCOBALAMIN Take 500 mcg by mouth daily.   VITAMIN B-6 PO Take 1 tablet by mouth 2 (two) times daily. Reported on 01/24/2016   Vitamin D3 1000 units Caps Take by mouth daily.          Objective:    BP 130/75 (BP Location: Left Arm, Patient Position: Sitting, Cuff Size: Normal)   Pulse 62   Temp 97.3 F (36.3 C) (Oral)   Ht _0  (1.6 m)   Wt 167 lb 3.2 oz (75.8 kg)   BMI 29.62 kg/m   Wt Readings from Last 3 Encounters:  03/29/16 167 lb 3.2 oz (75.8 kg)  01/24/16 165 lb 9.6 oz (75.1 kg)  12/18/15 170 lb (77.1 kg)    Physical Exam  Constitutional: She is oriented to person, place, and time. She appears well-developed and well-nourished. No distress.  Eyes: Conjunctivae and EOM are normal. Pupils are equal, round, and reactive to light.  Neck: Neck supple. No thyromegaly present.  Cardiovascular: Normal rate, regular rhythm, normal heart sounds and intact distal pulses.   No murmur heard. Pulmonary/Chest: Effort normal and breath sounds normal. No respiratory distress. She has no wheezes.  Musculoskeletal: Normal range of motion. She exhibits no edema or tenderness.  Lymphadenopathy:    She has no cervical  adenopathy.  Neurological: She is alert and oriented to person, place, and time. She has normal strength. No cranial nerve deficit or sensory deficit. Coordination normal.  Skin: Skin is warm and dry. No rash noted. She is not diaphoretic.  Psychiatric: Her behavior is normal. Judgment and thought content normal. Her mood appears anxious. Her affect is not labile. She exhibits a depressed mood. She expresses no suicidal ideation. She expresses no suicidal plans.  Nursing note and vitals reviewed.     Assessment & Plan:   Problem List Items Addressed This Visit      Nervous and Auditory   Foot neuralgia   Relevant Medications   DULoxetine (CYMBALTA) 30 MG capsule     Other   Insomnia - Primary   Anxiety and depression    Other Visit Diagnoses  None.     Follow up plan: Return in about 4 weeks (around 04/26/2016), or if symptoms worsen or fail to improve, for recheck DM and Depression.  Counseling provided for all of the vaccine components No orders of the defined types were placed in this encounter.   Caryl Pina, MD [facility] 03/29/2016, 12:05 PM

## 2016-04-03 ENCOUNTER — Ambulatory Visit: Payer: Medicare Other | Admitting: Family Medicine

## 2016-04-04 ENCOUNTER — Telehealth: Payer: Self-pay | Admitting: Family Medicine

## 2016-04-04 NOTE — Telephone Encounter (Signed)
Spoke with pt's daughter regarding increasing confusion appt scheduled for evaluation Daughter agrees with plan

## 2016-04-05 ENCOUNTER — Encounter: Payer: Self-pay | Admitting: Family Medicine

## 2016-04-05 ENCOUNTER — Ambulatory Visit (INDEPENDENT_AMBULATORY_CARE_PROVIDER_SITE_OTHER): Payer: Medicare Other | Admitting: Family Medicine

## 2016-04-05 VITALS — BP 138/51 | HR 59 | Temp 98.4°F | Ht 63.0 in | Wt 170.0 lb

## 2016-04-05 DIAGNOSIS — R488 Other symbolic dysfunctions: Secondary | ICD-10-CM | POA: Diagnosis not present

## 2016-04-05 DIAGNOSIS — R410 Disorientation, unspecified: Secondary | ICD-10-CM | POA: Diagnosis not present

## 2016-04-05 DIAGNOSIS — R482 Apraxia: Secondary | ICD-10-CM

## 2016-04-05 NOTE — Progress Notes (Signed)
BP (!) 138/51   Pulse (!) 59   Temp 98.4 F (36.9 C) (Oral)   Ht '5\' 3"'  (1.6 m)   Wt 170 lb (77.1 kg)   BMI 30.11 kg/m    Subjective:    Patient ID: Kristi Willis, female    DOB: June 13, 1941, 75 y.o.   MRN: [id number]  HPI: Kristi Willis is a 75 y.o. female presenting on 04/05/2016 for increased confusion   HPI Increased confusion Patient has been having increased for confusion per family and they are concerned about something going on neurologically and want to do that for workup. They were also concerned about it being depression which is why we switched of her medications last time and they're coming in early because they want to go ahead and do the workup now rather than wait for the antidepressant. She does not exhibit any focal numbness or weakness. They are just more concerned because she is not remembering things and is also answering questions inappropriately sometimes and forgetting words.  Relevant past medical, surgical, family and social history reviewed and updated as indicated. Interim medical history since our last visit reviewed. Allergies and medications reviewed and updated.  Review of Systems  Constitutional: Negative for chills and fever.  HENT: Negative for congestion, ear discharge and ear pain.   Eyes: Negative for redness and visual disturbance.  Respiratory: Negative for chest tightness and shortness of breath.   Cardiovascular: Negative for chest pain and leg swelling.  Genitourinary: Negative for difficulty urinating and dysuria.  Musculoskeletal: Negative for back pain and gait problem.  Skin: Negative for rash.  Neurological: Positive for speech difficulty. Negative for weakness, light-headedness, numbness and headaches.  Psychiatric/Behavioral: Positive for decreased concentration, dysphoric mood and sleep disturbance. Negative for agitation, behavioral problems, self-injury and suicidal ideas. The patient is not nervous/anxious.   All  other systems reviewed and are negative.   Per HPI unless specifically indicated above     Medication List       Accurate as of 04/05/16  2:47 PM. Always use your most recent med list.          ACCU-CHEK AVIVA PLUS w/Device Kit CHECK BLOOD SUGAR UP TO 4 TIMES A DAY   acyclovir ointment 5 % Commonly known as:  ZOVIRAX Apply 1 application topically every 3 (three) hours. Around mouth, Until symptoms clear   albuterol 108 (90 Base) MCG/ACT inhaler Commonly known as:  VENTOLIN HFA Inhale 2 puffs into the lungs every 6 (six) hours as needed for wheezing.   amLODipine 5 MG tablet Commonly known as:  NORVASC Take 1 tablet by mouth daily.   azelastine 0.1 % nasal spray Commonly known as:  ASTELIN Place 2 sprays into both nostrils 2 (two) times daily.   CALTRATE 600+D 600-400 MG-UNIT tablet Generic drug:  Calcium Carbonate-Vitamin D Take 1 tablet by mouth 2 (two) times daily.   doxazosin 8 MG tablet Commonly known as:  CARDURA Take 1 tablet (8 mg total) by mouth daily. For blood pressure   DULoxetine 30 MG capsule Commonly known as:  CYMBALTA Take 1 capsule (30 mg total) by mouth daily.   EPIPEN 2-PAK 0.3 mg/0.3 mL Soaj injection Generic drug:  EPINEPHrine Reported on 01/24/2016   lisinopril 40 MG tablet Commonly known as:  PRINIVIL,ZESTRIL TAKE 1 TABLET DAILY   loratadine 10 MG tablet Commonly known as:  CLARITIN Take 1 tablet (10 mg total) by mouth daily.   metoprolol succinate 100 MG 24 hr tablet Commonly known as:  TOPROL-XL TAKE  (1)  TABLET TWICE A DAY.   montelukast 10 MG tablet Commonly known as:  SINGULAIR Take 1 tablet (10 mg total) by mouth at bedtime.   multivitamin tablet Take 1 tablet by mouth daily.   pioglitazone 15 MG tablet Commonly known as:  ACTOS Take 1 tablet (15 mg total) by mouth daily.   rosuvastatin 10 MG tablet Commonly known as:  CRESTOR Take 1 tablet (10 mg total) by mouth daily.   sitaGLIPtin 100 MG tablet Commonly known  as:  JANUVIA Take 1 tablet (100 mg total) by mouth daily.   vitamin B-12 500 MCG tablet Commonly known as:  CYANOCOBALAMIN Take 500 mcg by mouth daily.   VITAMIN B-6 PO Take 1 tablet by mouth 2 (two) times daily. Reported on 01/24/2016   Vitamin D3 1000 units Caps Take by mouth daily.          Objective:    BP (!) 138/51   Pulse (!) 59   Temp 98.4 F (36.9 C) (Oral)   Ht '5\' 3"'  (1.6 m)   Wt 170 lb (77.1 kg)   BMI 30.11 kg/m   Wt Readings from Last 3 Encounters:  04/05/16 170 lb (77.1 kg)  03/29/16 167 lb 3.2 oz (75.8 kg)  01/24/16 165 lb 9.6 oz (75.1 kg)    Physical Exam  Constitutional: She appears well-developed and well-nourished. No distress.  Eyes: Conjunctivae and EOM are normal. Pupils are equal, round, and reactive to light.  Neck: Neck supple. No thyromegaly present.  Cardiovascular: Normal rate, regular rhythm, normal heart sounds and intact distal pulses.   No murmur heard. Pulmonary/Chest: Effort normal and breath sounds normal. No respiratory distress. She has no wheezes.  Musculoskeletal: Normal range of motion. She exhibits no edema or tenderness.  Lymphadenopathy:    She has no cervical adenopathy.  Neurological: She is alert. She is not disoriented. She displays normal reflexes. No cranial nerve deficit or sensory deficit. She exhibits normal muscle tone. Coordination normal.  Skin: Skin is warm and dry. No rash noted. She is not diaphoretic.  Psychiatric: She has a normal mood and affect. Her behavior is normal. She expresses no suicidal ideation. She expresses no suicidal plans.  Nursing note and vitals reviewed.     Assessment & Plan:   Problem List Items Addressed This Visit    None    Visit Diagnoses    Confusion    -  Primary   Patient has increased confusion, she is also been not acting normally, will do MRI and neurology referral, could be due to depression   Relevant Orders   MR Brain Wo Contrast   Ambulatory referral to Neurology    Speech apraxia       Per family sometimes patient has speech difficulty and mixes up her words, she is not doing that currently       Follow up plan: Return if symptoms worsen or fail to improve.  Counseling provided for all of the vaccine components Orders Placed This Encounter  Procedures  . MR Brain Wo Contrast  . Ambulatory referral to Neurology    Caryl Pina, MD [facility] 04/05/2016, 2:47 PM

## 2016-04-15 ENCOUNTER — Telehealth: Payer: Self-pay | Admitting: Family Medicine

## 2016-04-16 MED ORDER — ALPRAZOLAM 0.25 MG PO TABS: ORAL_TABLET | ORAL | 0 refills | Status: DC

## 2016-04-16 NOTE — Telephone Encounter (Signed)
Xanax 0.25 mg #4 take 1 tablet PO 1 hour prior to procedure. May repeat as needed called into pharmacy per verbal order by Evelina Dun, NP. Rx was called into pharmacy and left detailed message stating rx called in and to call back with any further questions or concerns.

## 2016-04-17 ENCOUNTER — Ambulatory Visit (HOSPITAL_COMMUNITY)
Admission: RE | Admit: 2016-04-17 | Discharge: 2016-04-17 | Disposition: A | Discharge status: Home / Self Care | Payer: Medicare Other | Source: Ambulatory Visit | Attending: Family Medicine | Admitting: Family Medicine

## 2016-04-17 DIAGNOSIS — R410 Disorientation, unspecified: Secondary | ICD-10-CM | POA: Insufficient documentation

## 2016-04-18 ENCOUNTER — Ambulatory Visit (HOSPITAL_COMMUNITY): Admission: RE | Admit: 2016-04-18 | Payer: Medicare Other | Source: Ambulatory Visit

## 2016-04-27 ENCOUNTER — Other Ambulatory Visit: Payer: Self-pay | Admitting: Family Medicine

## 2016-05-02 ENCOUNTER — Other Ambulatory Visit: Payer: Self-pay | Admitting: Family Medicine

## 2016-05-20 ENCOUNTER — Encounter: Payer: Self-pay | Admitting: Family Medicine

## 2016-05-20 ENCOUNTER — Ambulatory Visit (INDEPENDENT_AMBULATORY_CARE_PROVIDER_SITE_OTHER): Payer: Medicare Other | Admitting: Family Medicine

## 2016-05-20 ENCOUNTER — Other Ambulatory Visit: Payer: Medicare Other

## 2016-05-20 VITALS — BP 137/60 | HR 68 | Temp 97.4°F | Ht 63.0 in | Wt 169.0 lb

## 2016-05-20 DIAGNOSIS — E785 Hyperlipidemia, unspecified: Secondary | ICD-10-CM

## 2016-05-20 DIAGNOSIS — F418 Other specified anxiety disorders: Secondary | ICD-10-CM | POA: Diagnosis not present

## 2016-05-20 DIAGNOSIS — F32A Depression, unspecified: Secondary | ICD-10-CM

## 2016-05-20 DIAGNOSIS — I10 Essential (primary) hypertension: Secondary | ICD-10-CM | POA: Diagnosis not present

## 2016-05-20 DIAGNOSIS — E1142 Type 2 diabetes mellitus with diabetic polyneuropathy: Secondary | ICD-10-CM | POA: Diagnosis not present

## 2016-05-20 DIAGNOSIS — F329 Major depressive disorder, single episode, unspecified: Secondary | ICD-10-CM

## 2016-05-20 DIAGNOSIS — F419 Anxiety disorder, unspecified: Secondary | ICD-10-CM

## 2016-05-20 LAB — BAYER DCA HB A1C WAIVED: HB A1C: 8 % — AB (ref <7.0)

## 2016-05-20 MED ORDER — DULOXETINE HCL 60 MG PO CPEP: 60.0000 mg | ORAL_CAPSULE | Freq: Every day | ORAL | 3 refills | Status: DC

## 2016-05-20 NOTE — Assessment & Plan Note (Signed)
Doing better on Cymbalta but still having some issues with anxiety and especially having to wake her up at night. Would like to increase Cymbalta to 60 mg to see if it gives [place] more
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
## 2016-05-21 LAB — CBC WITH DIFFERENTIAL/PLATELET
BASOS: 0 %
Basophils Absolute: 0 10*3/uL (ref 0.0–0.2)
EOS (ABSOLUTE): 0.3 10*3/uL (ref 0.0–0.4)
EOS: 4 %
HEMATOCRIT: 37.1 % (ref 34.0–46.6)
HEMOGLOBIN: 12.1 g/dL (ref 11.1–15.9)
Immature Grans (Abs): 0 10*3/uL (ref 0.0–0.1)
Immature Granulocytes: 0 %
LYMPHS ABS: 2.3 10*3/uL (ref 0.7–3.1)
Lymphs: 31 %
MCH: 28.2 pg (ref 26.6–33.0)
MCHC: 32.6 g/dL (ref 31.5–35.7)
MCV: 87 fL (ref 79–97)
MONOCYTES: 8 %
Monocytes Absolute: 0.6 10*3/uL (ref 0.1–0.9)
NEUTROS ABS: 4.4 10*3/uL (ref 1.4–7.0)
Neutrophils: 57 %
Platelets: 219 10*3/uL (ref 150–379)
RBC: 4.29 x10E6/uL (ref 3.77–5.28)
RDW: 13.9 % (ref 12.3–15.4)
WBC: 7.6 10*3/uL (ref 3.4–10.8)

## 2016-05-21 LAB — BMP8+EGFR
BUN/Creatinine Ratio: 22 (ref 12–28)
BUN: 13 mg/dL (ref 8–27)
CALCIUM: 9.2 mg/dL (ref 8.7–10.3)
CHLORIDE: 97 mmol/L (ref 96–106)
CO2: 27 mmol/L (ref 18–29)
Creatinine, Ser: 0.58 mg/dL (ref 0.57–1.00)
GFR calc Af Amer: 105 mL/min/{1.73_m2} (ref >59)
GFR calc non Af Amer: 91 mL/min/{1.73_m2} (ref >59)
GLUCOSE: 202 mg/dL — AB (ref 65–99)
POTASSIUM: 4.3 mmol/L (ref 3.5–5.2)
Sodium: 139 mmol/L (ref 134–144)

## 2016-05-21 LAB — LIPID PANEL
CHOLESTEROL TOTAL: 207 mg/dL — AB (ref 100–199)
Chol/HDL Ratio: 3.5 ratio units (ref 0.0–4.4)
HDL: 59 mg/dL (ref >39)
LDL Calculated: 121 mg/dL — ABNORMAL HIGH (ref 0–99)
TRIGLYCERIDES: 134 mg/dL (ref 0–149)
VLDL Cholesterol Cal: 27 mg/dL (ref 5–40)

## 2016-05-30 ENCOUNTER — Other Ambulatory Visit: Payer: Self-pay | Admitting: Family Medicine

## 2016-06-05 ENCOUNTER — Ambulatory Visit (INDEPENDENT_AMBULATORY_CARE_PROVIDER_SITE_OTHER): Payer: Medicare Other | Admitting: *Deleted

## 2016-06-05 VITALS — BP 144/55 | HR 63 | Ht 63.0 in | Wt 171.0 lb

## 2016-06-05 DIAGNOSIS — Z0000 Encounter for general adult medical examination without abnormal findings: Secondary | ICD-10-CM

## 2016-06-05 NOTE — Patient Instructions (Signed)
  Ms. Crete ,  Thank you for taking time to come for your Medicare Wellness Visit. I appreciate your ongoing commitment to your health goals. Please review the following plan we discussed and let me know if I can assist you in the future.   Return stool card so that we can check it for blood.   These are the goals we discussed: Goals    . Decrease soda or juice intake          Decrease intake of diet mountain dew      . Exercise 3x per week (30 min per time)          Continue to walk 3 times a week for 30 minutes    . Plan meals          Try to eat vegetables, lean proteins, and limit amount of fried foods        Eat low sodium nuts like almonds as a snack.   This is a list of the screening recommended for you and due dates:  Health Maintenance  Topic Date Due  . Stool Blood Test  07/24/1991  . Colon Cancer Screening  08/19/2012  . Shingles Vaccine  07/06/2016*  . Flu Shot  11/16/2016*  . Hemoglobin A1C  11/18/2016  . Eye exam for diabetics  01/03/2017  . Mammogram  02/08/2017  . Complete foot exam   05/20/2017  . DEXA scan (bone density measurement)  06/19/2018  . Tetanus Vaccine  02/01/2025  . Pneumonia vaccines  Completed  *Topic was postponed. The date shown is not the original due date.

## 2016-06-06 NOTE — Progress Notes (Signed)
Subjective:   Kristi Willis is a 75 y.o. female who presents for an Initial Medicare Annual Wellness Visit. 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 She enjoys reading and doing puzzles. She is active in her church and Sunday School program. She is widowed and lives at home alone. She doesn't have any pets. She has one daughter and 2 grandchildren. Her daughter checks on her frequently and they all eat meals together often.   Review of Systems    Kristi Willis reports that her health is about the same as last year.   Cardiac Risk Factors include: advanced age (>35mn, >>84women);diabetes mellitus;dyslipidemia;obesity (BMI >30kg/m2);hypertension  Neurologic: lower extremity neuropathy and difficulty forming sentences. She knows what she wants to say but has a hard time expressing it in words. Evaluation with neurology scheduled for next week.      Objective:    Today's Vitals   06/05/16 1109  BP: (!) 144/55  Pulse: 63  Weight: 171 lb (77.6 kg)  Height: '5\' 3"'  (1.6 m)   Body mass index is 30.29 kg/m.   Current Medications (verified) Outpatient Encounter Prescriptions as of 06/05/2016  Medication Sig  . acyclovir ointment (ZOVIRAX) 5 % Apply 1 application topically every 3 (three) hours. Around mouth, Until symptoms clear  . albuterol (VENTOLIN HFA) 108 (90 BASE) MCG/ACT inhaler Inhale 2 puffs into the lungs every 6 (six) hours as needed for wheezing.  .Marland KitchenamLODipine (NORVASC) 5 MG tablet Take 1 tablet by mouth daily.  .Marland Kitchenaspirin 81 MG chewable tablet Chew 81 mg by mouth daily.  .Marland Kitchenazelastine (ASTELIN) 0.1 % nasal spray Place 2 sprays into both nostrils 2 (two) times daily.  . Blood Glucose Monitoring Suppl (ACCU-CHEK AVIVA PLUS) w/Device KIT CHECK BLOOD SUGAR UP TO 4 TIMES A DAY  . Calcium Carbonate-Vitamin D (CALTRATE 600+D) 600-400 MG-UNIT per tablet Take 1 tablet by mouth 2 (two) times daily.  . Cholecalciferol (VITAMIN D3) 1000 UNITS CAPS  Take by mouth daily.    .Marland Kitchendoxazosin (CARDURA) 8 MG tablet Take 1 tablet (8 mg total) by mouth daily. For blood pressure  . DULoxetine (CYMBALTA) 30 MG capsule TAKE (1) CAPSULE DAILY  . EPIPEN 2-PAK 0.3 MG/0.3ML SOAJ injection Reported on 01/24/2016  . lisinopril (PRINIVIL,ZESTRIL) 40 MG tablet TAKE 1 TABLET DAILY  . loratadine (CLARITIN) 10 MG tablet Take 1 tablet (10 mg total) by mouth daily.  . metoprolol succinate (TOPROL-XL) 100 MG 24 hr tablet TAKE  (1)  TABLET TWICE A DAY.  . montelukast (SINGULAIR) 10 MG tablet Take 1 tablet (10 mg total) by mouth at bedtime.  . Multiple Vitamin (MULTIVITAMIN) tablet Take 1 tablet by mouth daily.    . pioglitazone (ACTOS) 15 MG tablet Take 1 tablet (15 mg total) by mouth daily.  . Pyridoxine HCl (VITAMIN B-6 PO) Take 1 tablet by mouth 2 (two) times daily. Reported on 01/24/2016  . rosuvastatin (CRESTOR) 10 MG tablet TAKE 1 TABLET DAILY  . sitaGLIPtin (JANUVIA) 100 MG tablet Take 1 tablet (100 mg total) by mouth daily.  . vitamin B-12 (CYANOCOBALAMIN) 500 MCG tablet Take 500 mcg by mouth daily.    . Vitamin D, Ergocalciferol, (DRISDOL) 50000 units CAPS capsule TAKE 1 CAPSULE ONCE A WEEK  . [DISCONTINUED] DULoxetine (CYMBALTA) 60 MG capsule Take 1 capsule (60 mg total) by mouth daily.   No facility-administered encounter medications on file as of 06/05/2016.     Allergies (verified) Dimetapp c [phenylephrine-bromphen-codeine]; Phenylephrine hcl;  Ultram [tramadol hcl]; Celebrex [celecoxib]; Cortisone; Fenofibrate; and Sulfa antibiotics   History: Past Medical History:  Diagnosis Date  . Allergy   . Asthma   . Cancer ([place])    skin  . Diabetes mellitus   . GERD (gastroesophageal reflux disease)    hiatal hernia  . Hypercholesteremia   . Hypertension   . Neuromuscular disorder ([facility])    DM neuropathy  . Neuropathy ([place])   . SVT (supraventricular tachycardia) ([facility])    Past Surgical History:  Procedure Laterality Date  . BREAST BIOPSY     left  breast biopsy  . LUMBAR LAMINECTOMY  08/10  . SKIN CANCER EXCISION  2005  . SPINE SURGERY     lumbar laminectomy   Family History  Problem Relation Age of Onset  . Cancer Mother 19    cervical cancer  . Depression Mother   . Heart disease Father   . Stroke Father   . Cancer Father     lung cancer  . Heart disease Sister 60    MI  . Hypertension Sister   . Cancer Sister 54    breast cancer  . Healthy Daughter    Social History   Occupational History  . Retired from [employer] but still works part-time    Social History Main Topics  . Smoking status: Never Smoker  . Smokeless tobacco: Never Used  . Alcohol use No  . Drug use: No  . Sexual activity: No    Tobacco Counseling No tobacco use  Activities of Daily Living In your present state of health, do you have any difficulty performing the following activities: 06/05/2016 01/24/2016  Hearing? N N  Vision? N N  Difficulty concentrating or making decisions? N N  Walking or climbing stairs? Y N  Dressing or bathing? N N  Doing errands, shopping? N N  Preparing Food and eating ? N -  Using the Toilet? N -  In the past six months, have you accidently leaked urine? Y -  Do you have problems with loss of bowel control? N -  Managing your Medications? N -  Managing your Finances? N -  Housekeeping or managing your Housekeeping? N -  Some recent data might be hidden    Immunizations and Health Maintenance Immunization History  Administered Date(s) Administered  . Influenza,inj,Quad PF,36+ Mos 05/20/2013, 05/31/2014, 07/18/2015  . Influenza-Unspecified 08/20/2011  . Pneumococcal Conjugate-13 05/31/2014  . Pneumococcal Polysaccharide-23 07/22/2012  . Tdap 02/02/2015   Health Maintenance Due  Topic Date Due  . COLON CANCER SCREENING ANNUAL FOBT  07/24/1991  . COLONOSCOPY  08/19/2012    Patient Care Team: Worthy Rancher, MD as PCP - General (Family Medicine) Deboraha Sprang, MD as Consulting Physician  (Cardiology) Ellouise Newer, MD- Neurology Monna Fam, MD- opthalmology  Assessment:   This is a routine wellness examination for Kristi Willis.   Hearing/Vision screen No deficit noted during exam. Eye exam was done last week.   Dietary issues and exercise activities discussed: Current Exercise Habits: Home exercise routine, Type of exercise: walking, Time (Minutes): 30, Frequency (Times/Week): 3, Weekly Exercise (Minutes/Week): 90, Intensity: Moderate, Exercise limited by: neurologic condition(s) Kristi Crenshaw walks in the gym at the recreation department 2-3 times per week for 30 minutes  Goals    . Decrease soda or juice intake          Decrease intake of diet mountain dew      . Exercise 3x per week (30 min per time)  Continue to walk 3 times a week for 30 minutes    . Plan meals          Try to eat vegetables, lean proteins, and limit amount of fried foods       Depression Screen PHQ 2/9 Scores 06/05/2016 05/20/2016 04/05/2016 03/29/2016 01/24/2016 12/18/2015 10/23/2015  PHQ - 2 Score '3 3 4 2 1 1 1  ' PHQ- 9 Score '12 12 12 7 ' - - -    Fall Risk Fall Risk  06/05/2016 04/05/2016 03/29/2016 01/24/2016 12/18/2015  Falls in the past year? No No No No No    Cognitive Function: MMSE - Mini Mental State Exam 06/05/2016 12/22/2014  Orientation to time 5 5  Orientation to Place 5 5  Registration 3 3  Attention/ Calculation 5 5  Recall 3 3  Language- name 2 objects 2 2  Language- repeat 0 0  Language- follow 3 step command 3 3  Language- read & follow direction 1 1  Write a sentence 1 1  Copy design 1 1  Total score 29 29    Normal exam    Screening Tests Health Maintenance  Topic Date Due  . COLON CANCER SCREENING ANNUAL FOBT  07/24/1991  . COLONOSCOPY  08/19/2012  . ZOSTAVAX  07/06/2016 (Originally 07/23/2001)  . INFLUENZA VACCINE  11/16/2016 (Originally 03/19/2016)  . HEMOGLOBIN A1C  11/18/2016  . OPHTHALMOLOGY EXAM  01/03/2017  . MAMMOGRAM  02/08/2017  . FOOT EXAM   05/20/2017  . DEXA SCAN  06/19/2018  . TETANUS/TDAP  02/01/2025  . PNA vac Low Risk Adult  Completed      Plan:  Continue to walk 3 times per week for 30 minutes  During the course of the visit, Tyrisha was educated and counseled about the following appropriate screening and preventive services:   Vaccines to include Pneumoccal, Influenza, Tdap-Up to date. Declined Zostavax  Colorectal cancer screening-return FOBT  Bone density screening  Glaucoma screening-Has yearly exams  Mammography-up to date  Nutrition counseling-decrease Mountain Dew consumption   Patient Instructions (the written plan) were given to the patient.    Chong Sicilian, RN   06/06/2016    I have reviewed and agree with the above AWV documentation.   Caryl Pina, MD [facility] 07/17/2016, 2:03 PM

## 2016-06-10 ENCOUNTER — Ambulatory Visit (INDEPENDENT_AMBULATORY_CARE_PROVIDER_SITE_OTHER): Payer: Medicare Other | Admitting: Neurology

## 2016-06-10 ENCOUNTER — Encounter: Payer: Self-pay | Admitting: Neurology

## 2016-06-10 ENCOUNTER — Other Ambulatory Visit (INDEPENDENT_AMBULATORY_CARE_PROVIDER_SITE_OTHER): Payer: Medicare Other

## 2016-06-10 VITALS — BP 146/68 | HR 71 | Ht 63.0 in | Wt 172.5 lb

## 2016-06-10 DIAGNOSIS — G3184 Mild cognitive impairment, so stated: Secondary | ICD-10-CM

## 2016-06-10 DIAGNOSIS — R4789 Other speech disturbances: Secondary | ICD-10-CM

## 2016-06-10 LAB — TSH: TSH: 2.28 u[IU]/mL (ref 0.35–4.50)

## 2016-06-10 LAB — VITAMIN B12: VITAMIN B 12: 735 pg/mL (ref 211–911)

## 2016-06-10 NOTE — Patient Instructions (Signed)
1. Bloodwork for TSH, B12 2. Schedule Neurocognitive testing with Dr. Si Raider 3. Follow-up in 2-3 months (after Neurocognitive testing)

## 2016-06-10 NOTE — Progress Notes (Signed)
NEUROLOGY CONSULTATION NOTE  [name] MRN: [id number] DOB: 07/16/1941  Referring provider: Dr. Vonna Kotyk Dettinger Primary care provider: Dr. Vonna Kotyk Dettinger  Reason for consult:  confusion  Dear Dr Dettinger:  Thank you for your kind referral of Kristi Willis for consultation of the above symptoms. Although her history is well known to you, please allow me to reiterate it for the purpose of our medical record. The patient was accompanied to the clinic by her daughter who also provides collateral information. Records and images were personally reviewed where available.  HISTORY OF PRESENT ILLNESS: This is a 75 year old right-handed woman with a history of hypertension, diabetes, neuropathy, presenting for word-finding difficulties. She and her daughter started noticing symptoms for the past couple of years, but worsened after her husband passed away last 2023/06/13. She reports that she knows what she wants to say but it does not come out as it should. Her daughter noticed it was hard to have conversation with her. She started to want to be alone and stay at home due to this. She was started on an antidepressant, and reports that symptoms did improve a little, but she still struggles to get out the idea of what she wants to say. She has good and bad days. She lives alone and denies getting lost driving. Her daughter though reports she falls asleep driving so her sister is with her for long distance driving. She occasionally forgets her medications and occasionally misplaces things. They could not find their Christmas gifts and got them in July. No missed bill payments. No hygiene issues, no difficulties with ADLs. Her daughter feels that the confusion is a little better, but she is still not comfortable talking to other people. She had an MRI brain without contrast done 04/17/16 for these symptoms which I personally reviewed, no acute changes, there was mild diffuse volume  loss.  She has neuropathy with burning and numbness in both feet. She has occasional back pain, no falls. She occasionally has problems swallowing meat. She denies any headaches, dizziness, diplopia, dysarthria, neck pain, focal numbness/tingling/weakness, bowel/bladder dysfunction, anosmia, or tremors. No family history of dementia, no history of significant head injuries. She occasionally drinks alcohol.  Laboratory Data: Lab Results  Component Value Date   WBC 7.6 05/20/2016   HGB 11.1 (L) 02/04/2016   HCT 37.1 05/20/2016   MCV 87 05/20/2016   PLT 219 05/20/2016     Chemistry      Component Value Date/Time   NA 139 05/20/2016 1602   K 4.3 05/20/2016 1602   CL 97 05/20/2016 1602   CO2 27 05/20/2016 1602   BUN 13 05/20/2016 1602   CREATININE 0.58 05/20/2016 1602   CREATININE 0.54 03/02/2013 0931      Component Value Date/Time   CALCIUM 9.2 05/20/2016 1602   ALKPHOS 66 01/24/2016 0853   AST 51 (H) 01/24/2016 0853   ALT 35 (H) 01/24/2016 0853   BILITOT 0.4 01/24/2016 0853      PAST MEDICAL HISTORY: Past Medical History:  Diagnosis Date  . Allergy   . Asthma   . Cancer ([place])    skin  . Diabetes mellitus   . GERD (gastroesophageal reflux disease)    hiatal hernia  . Hypercholesteremia   . Hypertension   . Neuromuscular disorder ([facility])    DM neuropathy  . Neuropathy ([place])   . SVT (supraventricular tachycardia) ([place])     PAST SURGICAL HISTORY: Past Surgical History:  Procedure Laterality Date  .  BREAST BIOPSY     left breast biopsy  . LUMBAR LAMINECTOMY  08/10  . SKIN CANCER EXCISION  2005  . SPINE SURGERY     lumbar laminectomy    MEDICATIONS: Current Outpatient Prescriptions on File Prior to Visit  Medication Sig Dispense Refill  . acyclovir ointment (ZOVIRAX) 5 % Apply 1 application topically every 3 (three) hours. Around mouth, Until symptoms clear 15 g 5  . albuterol (VENTOLIN HFA) 108 (90 BASE) MCG/ACT inhaler Inhale 2 puffs into the lungs every 6  (six) hours as needed for wheezing. 1 Inhaler 3  . amLODipine (NORVASC) 5 MG tablet Take 1 tablet by mouth daily.    Marland Kitchen aspirin 81 MG chewable tablet Chew 81 mg by mouth daily.    Marland Kitchen azelastine (ASTELIN) 0.1 % nasal spray Place 2 sprays into both nostrils 2 (two) times daily. 30 mL 11  . Blood Glucose Monitoring Suppl (ACCU-CHEK AVIVA PLUS) w/Device KIT CHECK BLOOD SUGAR UP TO 4 TIMES A DAY 1 kit 0  . Calcium Carbonate-Vitamin D (CALTRATE 600+D) 600-400 MG-UNIT per tablet Take 1 tablet by mouth 2 (two) times daily.    . Cholecalciferol (VITAMIN D3) 1000 UNITS CAPS Take by mouth daily.      Marland Kitchen doxazosin (CARDURA) 8 MG tablet Take 1 tablet (8 mg total) by mouth daily. For blood pressure 90 tablet 3  . DULoxetine (CYMBALTA) 30 MG capsule TAKE (1) CAPSULE DAILY 30 capsule 2  . EPIPEN 2-PAK 0.3 MG/0.3ML SOAJ injection Reported on 01/24/2016    . lisinopril (PRINIVIL,ZESTRIL) 40 MG tablet TAKE 1 TABLET DAILY 90 tablet 0  . loratadine (CLARITIN) 10 MG tablet Take 1 tablet (10 mg total) by mouth daily. 30 tablet 11  . metoprolol succinate (TOPROL-XL) 100 MG 24 hr tablet TAKE  (1)  TABLET TWICE A DAY. 180 tablet 0  . montelukast (SINGULAIR) 10 MG tablet Take 1 tablet (10 mg total) by mouth at bedtime. 30 tablet 5  . Multiple Vitamin (MULTIVITAMIN) tablet Take 1 tablet by mouth daily.      . pioglitazone (ACTOS) 15 MG tablet Take 1 tablet (15 mg total) by mouth daily. 30 tablet 3  . Pyridoxine HCl (VITAMIN B-6 PO) Take 1 tablet by mouth 2 (two) times daily. Reported on 01/24/2016    . rosuvastatin (CRESTOR) 10 MG tablet TAKE 1 TABLET DAILY 90 tablet 0  . sitaGLIPtin (JANUVIA) 100 MG tablet Take 1 tablet (100 mg total) by mouth daily. 30 tablet 2  . vitamin B-12 (CYANOCOBALAMIN) 500 MCG tablet Take 500 mcg by mouth daily.      . Vitamin D, Ergocalciferol, (DRISDOL) 50000 units CAPS capsule TAKE 1 CAPSULE ONCE A WEEK 12 capsule 0   No current facility-administered medications on file prior to visit.      ALLERGIES: Allergies  Allergen Reactions  . Dimetapp C [Phenylephrine-Bromphen-Codeine] Itching  . Phenylephrine Hcl Other (See Comments)    unknown  . Ultram [Tramadol Hcl] Other (See Comments)    Insomnia  . Celebrex [Celecoxib] Rash  . Cortisone Rash  . Fenofibrate Rash  . Sulfa Antibiotics Rash    FAMILY HISTORY: Family History  Problem Relation Age of Onset  . Cancer Mother 49    cervical cancer  . Depression Mother   . Heart disease Father   . Stroke Father   . Cancer Father     lung cancer  . Heart disease Sister 81    MI  . Hypertension Sister   . Cancer Sister 68  breast cancer  . Healthy Daughter     SOCIAL HISTORY: Social History   Social History  . Marital status: Widowed    Spouse name: N/A  . Number of children: N/A  . Years of education: N/A   Occupational History  . Retired from [employer] but still works part-time    Social History Main Topics  . Smoking status: Never Smoker  . Smokeless tobacco: Never Used  . Alcohol use No  . Drug use: No  . Sexual activity: No   Other Topics Concern  . Not on file   Social History Narrative   Lives with husband, who is a patient of Dr Lovena Le. She is active around the house and exercises regularly.    REVIEW OF SYSTEMS: Constitutional: No fevers, chills, or sweats, no generalized fatigue, change in appetite Eyes: No visual changes, double vision, eye pain Ear, nose and throat: No hearing loss, ear pain, nasal congestion, sore throat Cardiovascular: No chest pain, palpitations Respiratory:  No shortness of breath at rest or with exertion, wheezes GastrointestinaI: No nausea, vomiting, diarrhea, abdominal pain, fecal incontinence Genitourinary:  No dysuria, urinary retention or frequency Musculoskeletal:  No neck pain,+ back pain Integumentary: No rash, pruritus, skin lesions Neurological: as above Psychiatric: + depression, no insomnia, anxiety Endocrine: No palpitations, fatigue,  diaphoresis, mood swings, change in appetite, change in weight, increased thirst Hematologic/Lymphatic:  No anemia, purpura, petechiae. Allergic/Immunologic: no itchy/runny eyes, nasal congestion, recent allergic reactions, rashes  PHYSICAL EXAM: Vitals:   06/10/16 1301  BP: (!) 146/68  Pulse: 71   General: No acute distress Head:  Normocephalic/atraumatic Eyes: Fundoscopic exam shows bilateral sharp discs, no vessel changes, exudates, or hemorrhages Neck: supple, no paraspinal tenderness, full range of motion Back: No paraspinal tenderness Heart: regular rate and rhythm Lungs: Clear to auscultation bilaterally. Vascular: No carotid bruits. Skin/Extremities: No rash, no edema Neurological Exam: Mental status: alert and oriented to person, place, and time, no dysarthria or aphasia, Fund of knowledge is appropriate.  Recent and remote memory are intact.  Attention and concentration are normal.    Able to name objects and repeat phrases.  Montreal Cognitive Assessment  06/10/2016  Visuospatial/ Executive (0/5) 5  Naming (0/3) 3  Attention: Read list of digits (0/2) 1  Attention: Read list of letters (0/1) 0  Attention: Serial 7 subtraction starting at 100 (0/3) 1  Language: Repeat phrase (0/2) 1  Language : Fluency (0/1) 0  Abstraction (0/2) 2  Delayed Recall (0/5) 5  Orientation (0/6) 5  Total 23  Adjusted Score (based on education) 24   Cranial nerves: CN I: not tested CN II: pupils equal, round and reactive to light, visual fields intact, fundi unremarkable. CN III, IV, VI:  full range of motion, no nystagmus, no ptosis CN V: facial sensation intact CN VII: upper and lower face symmetric CN VIII: hearing intact to finger rub CN IX, X: gag intact, uvula midline CN XI: sternocleidomastoid and trapezius muscles intact CN XII: tongue midline Bulk & Tone: normal, no fasciculations. Motor: 5/5 throughout with no pronator drift. Sensation: decreased pin and vibration to ankles  bilaterally, intact to all modalities on both UE. Romberg test positive sway Deep Tendon Reflexes: +2 on both UE, +1 bilateral patella, absent ankle jerks bilaterally, no ankle clonus Plantar responses: downgoing bilaterally Cerebellar: no incoordination on finger to nose, heel to shin. No dysdiadochokinesia Gait: narrow-based and steady, able to tandem walk adequately. Tremor: none  IMPRESSION: This is a 75 year old right-handed woman  with a history of  hypertension, diabetes, neuropathy, presenting for word-finding difficulties. Her neurological exam is non-focal, with note of length-dependent neuropathy. MOCA score is 23/30, indicating mild cognitive impairment, with difficulties mostly with attention and fluency, she recalled 5/5 objects after 5 minutes. They have noticed an improvement with treatment of depression, however she continues to have speech difficulties. It is unclear if this is related to a neurodegenerative condition versus psychological, she will be scheduled for Neuropsychological evaluation to further evaluate her symptoms. She will follow-up after Neuropsych testing.   Thank you for allowing me to participate in the care of this patient. Please do not hesitate to call for any questions or concerns.   Ellouise Newer, M.D.  CC: Dr. Warrick Parisian

## 2016-06-11 ENCOUNTER — Telehealth: Payer: Self-pay

## 2016-06-11 NOTE — Telephone Encounter (Signed)
Notified patient's daughter thyroid and B12 look good per Dr. Delice Lesch.

## 2016-06-13 ENCOUNTER — Ambulatory Visit (INDEPENDENT_AMBULATORY_CARE_PROVIDER_SITE_OTHER): Payer: Medicare Other

## 2016-06-13 DIAGNOSIS — Z23 Encounter for immunization: Secondary | ICD-10-CM | POA: Diagnosis not present

## 2016-06-14 ENCOUNTER — Other Ambulatory Visit: Payer: Self-pay | Admitting: Family Medicine

## 2016-06-17 ENCOUNTER — Other Ambulatory Visit (INDEPENDENT_AMBULATORY_CARE_PROVIDER_SITE_OTHER): Payer: Medicare Other

## 2016-06-17 DIAGNOSIS — Z1211 Encounter for screening for malignant neoplasm of colon: Secondary | ICD-10-CM

## 2016-06-18 LAB — FECAL OCCULT BLOOD, IMMUNOCHEMICAL: Fecal Occult Bld: NEGATIVE

## 2016-07-09 ENCOUNTER — Encounter: Payer: Self-pay | Admitting: Psychology

## 2016-07-09 ENCOUNTER — Ambulatory Visit (INDEPENDENT_AMBULATORY_CARE_PROVIDER_SITE_OTHER): Payer: Medicare Other | Admitting: Psychology

## 2016-07-09 DIAGNOSIS — R4789 Other speech disturbances: Secondary | ICD-10-CM

## 2016-07-09 DIAGNOSIS — R413 Other amnesia: Secondary | ICD-10-CM

## 2016-07-09 DIAGNOSIS — G3184 Mild cognitive impairment, so stated: Secondary | ICD-10-CM

## 2016-07-09 NOTE — Progress Notes (Signed)
NEUROPSYCHOLOGICAL INTERVIEW (CPT: [id number])  Name: [name] Date of Birth: [birth year] Date of Interview: 07/09/2016  Reason for Referral:  LOUISA FAVARO is a 75 y.o., right-handed female who is referred for neuropsychological evaluation by Dr. Ellouise Newer of [facility] due to concerns about word finding difficulty. This patient is accompanied in the office by her daughter, [name], who supplements the history.  History of Presenting Problem:  Ms. Bohle and her daughter reported that the patient has had significant word finding and communication difficulty in the past few years. They reported gradual onset with significant worsening after her husband passed away in 2015-05-25. She was started on Cymbalta some time after her husband passed away, and this did seem to help her communication difficulties somewhat but she continues to struggle. She is uncomfortable speaking with other people due to the difficulties. Her daughter also reported that in conversation she seems to often miss the first part and only process the last part.  Upon direct questioning, the patient and her daughter reported the following:   Forgetting recent conversations/events: Occasional Repeating statements/questions: Occasional Misplacing/losing items: No Forgetting appointments or other obligations: A little more confused about appointments but not significantly impaired Forgetting to take medications: Yes, some difficulty. She uses a Primary school teacher but then puts the pills in her pocket or out on the counter (because if she takes them all together she gets upset stomach) and decides when to take them - so sometimes she forgets to take them  Difficulty concentrating: Good with reading, but other things-"always been bad about that" Starting but not finishing tasks: No Distracted easily: No Processing information more slowly: Yes  Word-finding difficulty: Yes Word substitutions: Yes Writing  difficulty: Yes "once in a while" Spelling difficulty: Yes "once in a while"  Comprehension difficulty: "Once in a while" per patient. [name] says she does have this problem. Unclear if it is due to hearing or processing. It is more of an issue on the phone. The patient has not had an audiology evaluation. No reading comprehension difficulty - loves to read.  Getting lost when driving: No Making wrong turns when driving: No Uncertain about directions when driving or passenger: No  Her daughter notes that a while back she fell asleep when driving to [place], which is 30-40 min from her home, so her sister accompanies her now if she is driving 30 min or more. Otherwise her daughter doesn't have any concerns about her driving.   The patient was seen by Dr. Delice Lesch on 06/10/2016. Her neurological exam was non-focal with a note of length dependent neuropathy. She scored 24/30 on the [facility].   An MRI of the brain completed on 04/17/2016 showed mild diffuse volume loss but no acute changes.  There is no known family history of dementia. Her mother had schizophrenia. Her father was an alcoholic. She denied childhood history of abuse.  The patient reported a history of depression when her parents passed away and her daughter moved to college. She saw a psychologist for about a year at that time. This was very helpful. She has not had any counseling since then, including grief counseling after her husband passed. A local [employer] has offered a counseling program to her but she has not gone. She denies history of hallucinations or suicidal ideation/intention.   Current Functioning: The patient lives alone. She independently manages all instrumental ADLs without any reported difficulty, other than previously described forgetfulness with medications. Physically, the patient reported that she has  some difficulty with walking and balance but she has not had any falls. She has diabetes and reported that  this is not well controlled. She does not check her blood sugars regularly. She is upset that Cymbalta is making her gain weight. She has taken amitriptyline in the past and would like to be on it again because it did not make her gain weight, but her physician was reportedly concerned about anticholinergic effects of this medication causing confusion/memory loss. [facility] She does think Cymbalta has helped her mood, neuropathic pain and speech impairment. She does have difficulty sleeping with frequent awakening. The patient reported significant psychosocial stress surrounding her grandson (daughter's son) who has substance abuse problems and who has demanded that she give him all her deceased husband's belongings. He is not letting her see her great-grandson unless she does this.    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 Marital history: Widowed (was married 81 years). 1 Daughter.  Alcohol/Tobacco/Substances: No alcohol. Never a smoker. No substance abuse.   Medical History: Past Medical History:  Diagnosis Date  . Allergy   . Asthma   . Cancer ([place])    skin  . Diabetes mellitus   . GERD (gastroesophageal reflux disease)    hiatal hernia  . Hypercholesteremia   . Hypertension   . Neuromuscular disorder ([facility])    DM neuropathy  . Neuropathy ([place])   . SVT (supraventricular tachycardia) ([facility])      Current Medications:  Outpatient Encounter Prescriptions as of 07/09/2016  Medication Sig  . acyclovir ointment (ZOVIRAX) 5 % Apply 1 application topically every 3 (three) hours. Around mouth, Until symptoms clear  . albuterol (VENTOLIN HFA) 108 (90 BASE) MCG/ACT inhaler Inhale 2 puffs into the lungs every 6 (six) hours as needed for wheezing.  Marland Kitchen amLODipine (NORVASC) 5 MG  tablet Take 1 tablet by mouth daily.  Marland Kitchen amLODipine (NORVASC) 5 MG tablet TAKE 1 TABLET ONCE DAILY  . aspirin 81 MG chewable tablet Chew 81 mg by mouth daily.  Marland Kitchen azelastine (ASTELIN) 0.1 % nasal spray Place 2 sprays into both nostrils 2 (two) times daily.  . Blood Glucose Monitoring Suppl (ACCU-CHEK AVIVA PLUS) w/Device KIT CHECK BLOOD SUGAR UP TO 4 TIMES A DAY  . Calcium Carbonate-Vitamin D (CALTRATE 600+D) 600-400 MG-UNIT per tablet Take 1 tablet by mouth 2 (two) times daily.  . Cholecalciferol (VITAMIN D3) 1000 UNITS CAPS Take by mouth daily.    Marland Kitchen doxazosin (CARDURA) 8 MG tablet Take 1 tablet (8 mg total) by mouth daily. For blood pressure  . DULoxetine (CYMBALTA) 30 MG capsule TAKE (1) CAPSULE DAILY  . EPIPEN 2-PAK 0.3 MG/0.3ML SOAJ injection Reported on 01/24/2016  . lisinopril (PRINIVIL,ZESTRIL) 40 MG tablet TAKE 1 TABLET DAILY  . loratadine (CLARITIN) 10 MG tablet Take 1 tablet (10 mg total) by mouth daily.  . metoprolol succinate (TOPROL-XL) 100 MG 24 hr tablet TAKE  (1)  TABLET TWICE A DAY.  . montelukast (SINGULAIR) 10 MG tablet Take 1 tablet (10 mg total) by mouth at bedtime.  . Multiple Vitamin (MULTIVITAMIN) tablet Take 1 tablet by mouth daily.    . pioglitazone (ACTOS) 15 MG tablet Take 1 tablet (15 mg total) by mouth daily.  . Pyridoxine HCl (VITAMIN B-6 PO) Take 1 tablet by mouth 2 (two) times daily.  Reported on 01/24/2016  . rosuvastatin (CRESTOR) 10 MG tablet TAKE 1 TABLET DAILY  . sitaGLIPtin (JANUVIA) 100 MG tablet Take 1 tablet (100 mg total) by mouth daily.  . vitamin B-12 (CYANOCOBALAMIN) 500 MCG tablet Take 500 mcg by mouth daily.    . Vitamin D, Ergocalciferol, (DRISDOL) 50000 units CAPS capsule TAKE 1 CAPSULE ONCE A WEEK   No facility-administered encounter medications on file as of 07/09/2016.     Behavioral Observations:   Appearance: Appropriately dressed and groomed Gait: Ambulated independently, no gross abnormalities observed Speech: Frequent stammering,  difficulty expressing full sentences at times, disjointed speech at times, slow speech. Increased response latencies. Word finding difficulty. Thought process: Appears linear Affect: Full, appropriate, stable Interpersonal: Pleasant, appropriate   TESTING: There is medical necessity to proceed with neuropsychological assessment as the results will be used to aid in differential diagnosis and clinical decision-making and to inform specific treatment recommendations. Per the patient, her daughter and medical records reviewed, there has been a change in cognitive functioning and a reasonable suspicion of dementia. PPA, vascular dementia and pseudodementia (depression) are included among the differentials at this point.   PLAN: The patient will return for a full battery of neuropsychological testing with a psychometrician under my supervision. Education regarding testing procedures was provided. Subsequently, the patient will see this provider for a follow-up session at which time her test performances and my impressions and treatment recommendations will be reviewed in detail.   Full neuropsychological evaluation report to follow.

## 2016-07-12 ENCOUNTER — Other Ambulatory Visit: Payer: Self-pay | Admitting: Family Medicine

## 2016-07-29 ENCOUNTER — Ambulatory Visit: Payer: Medicare Other | Admitting: Family

## 2016-08-01 ENCOUNTER — Encounter: Payer: Self-pay | Admitting: Psychology

## 2016-08-02 ENCOUNTER — Other Ambulatory Visit: Payer: Self-pay | Admitting: Family Medicine

## 2016-08-08 ENCOUNTER — Ambulatory Visit (INDEPENDENT_AMBULATORY_CARE_PROVIDER_SITE_OTHER): Payer: Medicare Other | Admitting: Psychology

## 2016-08-08 ENCOUNTER — Encounter: Payer: Medicare Other | Admitting: Psychology

## 2016-08-08 DIAGNOSIS — R413 Other amnesia: Secondary | ICD-10-CM | POA: Diagnosis not present

## 2016-08-13 ENCOUNTER — Ambulatory Visit: Payer: Medicare Other | Admitting: Neurology

## 2016-08-19 NOTE — Progress Notes (Signed)
NEUROPSYCHOLOGICAL EVALUATION   Name:    Kristi Willis  Date of Birth:   27-Dec-1940 Date of Interview:  07/09/2016 Date of Testing:  08/08/2016   Date of Feedback:  08/20/2016      Background Information:  Reason for Referral:  Kristi Willis is a 76 y.o. female referred by Dr. Ellouise Newer to assess Kristi Willis current level of cognitive functioning and assist in differential diagnosis. Kristi current evaluation consisted of a review of available medical records, an interview with Kristi Willis and Kristi Willis daughter, Kristi Willis, and Kristi completion of a neuropsychological testing battery. Informed consent was obtained.  History of Presenting Problem:  Kristi Willis and Kristi Willis daughter reported that Kristi Willis has had significant word finding and communication difficulty in Kristi past few years. They reported gradual onset with significant worsening after Kristi Willis husband passed away in Jun 04, 2015. Kristi Willis was started on Cymbalta some time after Kristi Willis husband passed away, and this did seem to help Kristi Willis communication difficulties somewhat but Kristi Willis continues to struggle. Kristi Willis is uncomfortable speaking with other people due to Kristi difficulties. Kristi Willis daughter also reported that in conversation Kristi Willis seems to often miss Kristi first part and only process Kristi last part.  Upon direct questioning, Kristi Willis and Kristi Willis daughter reported Kristi following:   Forgetting recent conversations/events: Occasional Repeating statements/questions: Occasional Misplacing/losing items: No Forgetting appointments or other obligations: A little more confused about appointments but not significantly impaired Forgetting to take medications: Yes, some difficulty. Kristi Willis uses a Primary school teacher but then puts Kristi pills in Kristi Willis pocket or out on Kristi counter (because if Kristi Willis takes them all together Kristi Willis gets upset stomach) and decides when to take them - so sometimes Kristi Willis forgets to take them  Difficulty concentrating: Good with reading, but other things-"always  been bad about that" Starting but not finishing tasks: No Distracted easily: No Processing information more slowly: Yes  Word-finding difficulty: Yes Word substitutions: Yes Writing difficulty: Yes "once in a while" Spelling difficulty: Yes "once in a while"  Comprehension difficulty: "Once in a while" per Willis. Kristi Willis says Kristi Willis does have this problem. Unclear if it is due to hearing or processing. It is more of an issue on Kristi phone. Kristi Willis has not had an audiology evaluation. No reading comprehension difficulty - loves to read.  Getting lost when driving: No Making wrong turns when driving: No Uncertain about directions when driving or passenger: No  Kristi Willis daughter notes that a while back Kristi Willis fell asleep when driving to [place], which is 30-40 min from Kristi Willis home, so Kristi Willis sister accompanies Kristi Willis now if Kristi Willis is driving 30 min or more. Otherwise Kristi Willis daughter doesn't have any concerns about Kristi Willis driving.   Kristi Willis was seen by Dr. Delice Lesch on 06/10/2016. Kristi Willis neurological exam was non-focal with a note of length dependent neuropathy. Kristi Willis scored 24/30 on Kristi [facility].   An MRI of Kristi brain completed on 04/17/2016 reportedly showed mild diffuse volume loss but no acute changes.  There is no known family history of dementia. Kristi Willis mother had schizophrenia. Kristi Willis father was an alcoholic. Kristi Willis denied childhood history of abuse.  Kristi Willis reported a history of depression when Kristi Willis parents passed away and Kristi Willis daughter moved to college. Kristi Willis saw a psychologist for about a year at that time. This was very helpful. Kristi Willis has not had any counseling since then; Kristi Willis did not have grief counseling after Kristi Willis husband passed. A local [employer] has offered a counseling program to Kristi Willis but  Kristi Willis has not gone. Kristi Willis denies history of hallucinations or suicidal ideation/intention.   Current Functioning: Kristi Willis lives alone. Kristi Willis independently manages all instrumental ADLs without any reported difficulty,  other than previously described forgetfulness with medications. Physically, Kristi Willis reported that Kristi Willis has some difficulty with walking and balance but Kristi Willis has not had any falls. Kristi Willis has diabetes and reported that this is not well controlled. Kristi Willis does not check Kristi Willis blood sugars regularly. Kristi Willis is upset that Cymbalta is making Kristi Willis gain weight. Kristi Willis has taken amitriptyline in Kristi past and would like to be on it again because it did not make Kristi Willis gain weight, but Kristi Willis physician was reportedly concerned about anticholinergic effects of this medication causing confusion/memory loss. Kristi Willis does exercise regularly by walking [facility]. Kristi Willis does think Cymbalta has helped Kristi Willis mood, neuropathic pain and speech impairment. Kristi Willis does have difficulty sleeping with frequent awakening. Kristi Willis reported significant psychosocial stress surrounding Kristi Willis grandson (daughter's son) who has substance abuse problems and who has demanded that Kristi Willis give him all Kristi Willis deceased husband's belongings. He is not letting Kristi Willis see Kristi Willis great-grandson unless Kristi Willis does this.    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 Marital history: Widowed (was married 62 years). 1 Daughter.  Alcohol/Tobacco/Substances: No alcohol. Never a smoker. No substance abuse.   Medical History:  Past Medical History:  Diagnosis Date  . Allergy   . Asthma   . Cancer ([place])    skin  . Diabetes mellitus   . GERD (gastroesophageal reflux disease)    hiatal hernia  . Hypercholesteremia   . Hypertension   . Neuromuscular disorder ([facility])    DM neuropathy  . Neuropathy ([place])   . SVT (supraventricular tachycardia) ([place])     Current medications:  Outpatient Encounter Prescriptions as of 08/20/2016  Medication Sig  . acyclovir ointment (ZOVIRAX) 5 % Apply 1 application topically  every 3 (three) hours. Around mouth, Until symptoms clear  . albuterol (VENTOLIN HFA) 108 (90 BASE) MCG/ACT inhaler Inhale 2 puffs into Kristi lungs every 6 (six) hours as needed for wheezing.  Marland Kitchen amLODipine (NORVASC) 5 MG tablet Take 1 tablet by mouth daily.  Marland Kitchen amLODipine (NORVASC) 5 MG tablet TAKE 1 TABLET ONCE DAILY  . aspirin 81 MG chewable tablet Chew 81 mg by mouth daily.  Marland Kitchen azelastine (ASTELIN) 0.1 % nasal spray Place 2 sprays into both nostrils 2 (two) times daily.  . Blood Glucose Monitoring Suppl (ACCU-CHEK AVIVA PLUS) w/Device KIT CHECK BLOOD SUGAR UP TO 4 TIMES A DAY  . Calcium Carbonate-Vitamin D (CALTRATE 600+D) 600-400 MG-UNIT per tablet Take 1 tablet by mouth 2 (two) times daily.  . Cholecalciferol (VITAMIN D3) 1000 UNITS CAPS Take by mouth daily.    Marland Kitchen doxazosin (CARDURA) 8 MG tablet Take 1 tablet (8 mg total) by mouth daily. For blood pressure  . DULoxetine (CYMBALTA) 30 MG capsule TAKE (1) CAPSULE DAILY  . EPIPEN 2-PAK 0.3 MG/0.3ML SOAJ injection Reported on 01/24/2016  . lisinopril (PRINIVIL,ZESTRIL) 40 MG tablet TAKE 1 TABLET DAILY  . loratadine (CLARITIN) 10 MG tablet Take 1 tablet (10 mg total) by mouth daily.  . metoprolol succinate (TOPROL-XL) 100 MG 24 hr tablet TAKE  (1)  TABLET TWICE A DAY.  . montelukast (SINGULAIR) 10 MG tablet Take 1 tablet (10 mg total) by mouth at bedtime.  Marland Kitchen  Multiple Vitamin (MULTIVITAMIN) tablet Take 1 tablet by mouth daily.    . pioglitazone (ACTOS) 15 MG tablet TAKE 1 TABLET DAILY  . Pyridoxine HCl (VITAMIN B-6 PO) Take 1 tablet by mouth 2 (two) times daily. Reported on 01/24/2016  . rosuvastatin (CRESTOR) 10 MG tablet TAKE 1 TABLET DAILY  . sitaGLIPtin (JANUVIA) 100 MG tablet Take 1 tablet (100 mg total) by mouth daily.  . vitamin B-12 (CYANOCOBALAMIN) 500 MCG tablet Take 500 mcg by mouth daily.    . Vitamin D, Ergocalciferol, (DRISDOL) 50000 units CAPS capsule TAKE 1 CAPSULE ONCE A WEEK   No facility-administered encounter medications on file as  of 08/20/2016.      Current Examination:  Behavioral Observations:   Appearance: Appropriately dressed and groomed Gait: Ambulated independently, no gross abnormalities observed Speech: Frequent stammering, difficulty expressing full sentences at times, disjointed speech at times, slow speech. Slurring quality. Increased response latencies. Word finding difficulty. Thought process: Appears linear Affect: Full, appropriate, stable Interpersonal: Pleasant, appropriate Orientation: Oriented to person, place, and most aspects of time (off on Kristi date by one day). Unable to name Kristi current President or his predecessor.  Tests Administered: . Test of Premorbid Functioning (TOPF) . Wechsler Adult Intelligence Scale-Fourth Edition (WAIS-IV): Block Design, Similarities, Matrix Reasoning, Coding and Digit Span subtests . [employer] Verbal Learning Test - 2nd Edition (CVLT-2) Short Form . Repeatable Battery for Kristi Assessment of Neuropsychological Status (RBANS) Form A:  Figure Copy and Recall, Story Memory and Recall and Semantic Fluency subtests . Neuropsychological Assessment Battery (NAB) Language Module, Form 1: Auditory Comprehension, Naming, Reading Comprehension, Writing, and Bill Payment subtests . Boston Diagnostic Aphasia Examination: Repetition of words and repetition of phrases  subtests . Controlled Oral Word Association Test (COWAT) . Trail Making Test A and B . Clock drawing test . Generalized Anxiety Disorder - 7 item screener (GAD-7) . Beck Depression Inventory - Second Edition (BDI-II) . Inventory of Complicated Grief (ICG)  Test Results: Note: Standardized scores are presented only for use by appropriately trained professionals and to allow for any future test-retest comparison. These scores should not be interpreted without consideration of all Kristi information that is contained in Kristi rest of Kristi report. Kristi most recent standardization samples from Kristi test publisher or other  sources were used whenever possible to derive standard scores; scores were corrected for age, gender, ethnicity and education when available.   Test Scores:  Test Name Raw Score Standardized Score Descriptor  TOPF 20/70 SS= 80 Low average  WAIS-IV Subtests     Block Design 24/66 ss= 8 Low end of average  Similarities 15/36 ss= 6 Low average  Matrix Reasoning 8/26 ss= 8 Low end of average  Coding 41/135 ss= 8 Low end of average  Digit Span Forward 5/16 ss= 4 Impaired  Digit Span Backward 4/16 ss= 5 Borderline  RBANS Subtests     Figure Copy 19/20 Z= 0.7 High average  Figure Recall 7/20 Z= -1.3 Low average  Story Memory 9/24 Z= -2.3 Impaired  Story Recall 3/12 Z= -2.7 Severely impaired  Semantic Fluency 14 Z= -1.1 Low average  CVLT-II Scores     Trial 1 3/9 Z= -2.5 Impaired  Trial 4 3/9 Z= -2.5 Impaired  Trials 1-4 total 14/36 T= 22 Severely impaired  SD Free Recall 3/9 Z= -2 Impaired  LD Free Recall 5/9 Z= -0.5 Average  LD Cued Recall 6/9 Z= -0.5 Average  Recognition Discriminability 8/9 hits; 0 false positives Z= 0 Average  Forced Choice Recognition 9/9  WNL  NAB Language Subtests     Auditory Comprehension 84/89 T= 40 Low average  Naming 27/31 T= 40 Low average  Reading Comprehension 13/13 Cum %age: 52 WNL  Writing 9/11 T= 43 Average  Bill Payment 17/19 T= 45 Average  BDAE Subtest     Repetition of words 8/10  Impaired  Repetition of phrases 7/10  Impaired  COWAT-FAS 9 T= 29 Impaired  COWAT-Animals 12 T= 40 Low average  Trail Making Test A 36" 0 errors T= 54 Average  Trail Making Test B  246" 2 errors T= 31 Borderline  Clock Drawing   WNL  GAD-7 3/21  WNL  BDI-II 12/63  WNL  ICG 28/76  Above cutoff      Description of Test Results:  Premorbid verbal intellectual abilities were estimated to have been within Kristi low average range based on a test of word reading. Psychomotor processing speed was average. Auditory attention and working memory were impaired.  Visual-spatial construction was average to high average. Language abilities were variable but mostly intact. Specifically, both confrontation naming and semantic verbal fluency were low average, while repetition of words and phrases was impaired. Auditory comprehension and reading comprehension were intact. Written expression abilities were normal. Performance on a simulated bill payment task (requiring multiple aspects of expressive and receptive language) was within normal limits. With regard to verbal memory, encoding and acquisition of non-contextual information (i.e., word list) was severely impaired. After a brief distracter task, free recall was impaired (3/9 items recalled). After a delay, free recall was average (5/9 items recalled). Cued recall was average (6/9 items recalled). Performance on a yes/no recognition task was average. On another verbal memory test, encoding and acquisition of contextual auditory information (i.e., short story) was impaired. After a delay, free recall was severely impaired. With regard to non-verbal memory, delayed free recall of visual information was low average. Executive functioning was variable. Mental flexibility and set-shifting were borderline impaired on Trails B; Kristi Willis committed two set loss errors on this task. Verbal fluency with phonemic search restrictions was impaired. Verbal abstract reasoning was low average. Non-verbal abstract reasoning was low end of average. Performance on a clock drawing task was within normal limits. On self-report questionnaires, Kristi Willis's responses were not indicative of clinically significant depression or anxiety at Kristi present time. On a self-report questionnaire of grief, Kristi Willis endorsed a level of symptomatology associated with what is considered "complicated grief", or a chronic, heightened state of grief. Specific symptoms endorsed included: thinking about Kristi Willis deceased spouse so much that it's hard for Kristi Willis to do things Kristi Willis  normally does, longing for Kristi Willis spouse, feeling drawn to places and things associated with Kristi Willis spouse, feeling angry about his death, disbelief over his death, feeling stunned or dazed over what happened, difficulty trusting people since his death, feeling as though Kristi Willis has lost Kristi ability to care about other people or feel distant from people Kristi Willis cares about, feeling that life is empty without Kristi person who died, and feeling envious of others who have not lost someone else.   Clinical Impressions: Mild cognitive impairment (executive functioning). Grief-related depression. Results of Kristi current evaluation revealed variable memory abilities and executive dysfunction. Kristi Willis performance across memory tests was unusual in that encoding and retrieval were variable (at times intact, at times impaired). There was not a consistent pattern of memory/hippocampal consolidation dysfunction. However, Kristi Willis did demonstrate executive dysfunction across multiple tasks (including those of mental flexibility, auditory attention/working memory, and phonemic verbal fluency), and  as such it may be executive dysfunction that is leading to functional memory difficulties. Kristi Willis's test results are NOT consistent with Alzheimer's disease at this time. Additionally, despite apparent observed speech difficulties, there was no evidence of impairment in semantic retrieval or comprehension on formal testing. As such, an underlying primary progressive aphasia is not considered likely at this time.   Overall, Kristi Willis's test results are not consistent with a diagnosis of dementia but do suggest mild cognitive impairment. This could be vascular-related, given Kristi Willis uncontrolled diabetes. Additionally, Kristi Willis has significant grief-related depression and current psychosocial stress which is very likely contributing to Kristi Willis cognitive difficulties in daily life.   Recommendations/Plan: Based on Kristi findings of Kristi present evaluation,  Kristi following recommendations are offered:  1. Optimal control of vascular risk factors (i.e., diabetes): regular monitoring of blood sugars, follow dietary recommendations of treating provider(s), engage in regular safe cardiovascular exercise. Kristi Willis will ask Kristi Willis PCP if Kristi Willis needs to see a specialist (eg, endocrinologist). 2. Treatment for grief-related depression: I agree that an antidepressant is indicated and will defer to Kristi Willis treating provider to manage this. If there are options that have less risk for weight gain, this would be ideal and Kristi Willis may be more receptive. Additionally, I highly recommend that Kristi Willis participate in grief counseling. Kristi Willis has agreed to start attending a local grief support group at a church, and Kristi Willis also agreed to a referral to [facility] for individual grief counseling. 3. Stress management: Kristi Willis reported a significant amount of family stress at Kristi present time. In addition to grief counseling, Kristi Willis would benefit from learning stress management techniques. I provided some written information and education on this topic. 4. Neuropsychological re-evaluation may be considered in one year in order to monitor cognitive status, track any progression of symptoms and further assist with treatment planning.  5. Speech difficulties were observed in this Willis, and Kristi Willis demonstrated impaired repetition of words and phrases on testing. However, there was no evidence of semantic retrieval deficit, comprehension deficit or underlying PPA on formal testing. A referral to speech pathology could be considered for further assessment of speech difficulty.   Feedback to Willis: SAMORIA FEDORKO and Kristi Willis daughter returned for a feedback appointment on 08/20/2016 to review Kristi results of Kristi Willis neuropsychological evaluation with this provider. 20 minutes face-to-face time was spent reviewing Kristi Willis test results, my impressions and my recommendations as detailed above.      Total time spent on this Willis's case: [id number] unit for interview with psychologist; [phone] units of testing by psychometrician under psychologist's supervision; [phone] units for medical record review, scoring of neuropsychological tests, interpretation of test results, preparation of this report, and review of results to Kristi Willis by psychologist.      Thank you for your referral of Kristi Willis. Please feel free to contact me if you have any questions or concerns regarding this report.

## 2016-08-20 ENCOUNTER — Encounter: Payer: Self-pay | Admitting: Psychology

## 2016-08-20 ENCOUNTER — Ambulatory Visit (INDEPENDENT_AMBULATORY_CARE_PROVIDER_SITE_OTHER): Payer: Medicare Other | Admitting: Psychology

## 2016-08-20 DIAGNOSIS — F4320 Adjustment disorder, unspecified: Secondary | ICD-10-CM

## 2016-08-20 DIAGNOSIS — R413 Other amnesia: Secondary | ICD-10-CM

## 2016-08-20 DIAGNOSIS — G3184 Mild cognitive impairment, so stated: Secondary | ICD-10-CM | POA: Diagnosis not present

## 2016-08-20 DIAGNOSIS — F4321 Adjustment disorder with depressed mood: Secondary | ICD-10-CM

## 2016-08-20 DIAGNOSIS — R4789 Other speech disturbances: Secondary | ICD-10-CM

## 2016-08-20 NOTE — Progress Notes (Signed)
   Neuropsychology Note  Kristi Willis returned today for 2 hours of neuropsychological testing with technician, Milana Kidney, BS, under the supervision of Dr. Macarthur Critchley. The patient did not appear overtly distressed by the testing session, per behavioral observation or via self-report to the technician. Rest breaks were offered. Kristi Willis will return within 2 weeks for a feedback session with Dr. Si Raider at which time her test performances, clinical impressions and treatment recommendations will be reviewed in detail. The patient understands she can contact our office should she require our assistance before this time.  Full report to follow.

## 2016-08-20 NOTE — Patient Instructions (Addendum)
Your test results do not indicate Alzheimer's disease or other dementia at this time. You did have difficulty on tests measuring executive functioning (i.e., mental flexibility, shifting from one task to another, organizing information, and holding information in your mind while you do something with it).   Mild cognitive impairment can result from poorly controlled diabetes. Additionally, I think your significant stress and grief are contributing to cognitive symptoms.  Recommendations/Plan: Based on the findings of the present evaluation, the following recommendations are offered:  1. Optimal control of vascular risk factors (i.e., diabetes): regular monitoring of blood sugars, follow dietary recommendations of treating provider(s), engage in regular safe cardiovascular exercise.  2. Treatment for grief-related depression: I agree that an antidepressant is indicated and will defer to her treating provider to manage this. If there are options that have less risk for weight gain, this would be ideal. Additionally, I highly recommend that the patient participate in grief counseling.  3. Stress management: The patient reported a significant amount of family stress at the present time. In addition to grief counseling, she would benefit from learning stress management techniques.

## 2016-08-22 ENCOUNTER — Encounter: Payer: Self-pay | Admitting: Family Medicine

## 2016-08-22 ENCOUNTER — Ambulatory Visit (INDEPENDENT_AMBULATORY_CARE_PROVIDER_SITE_OTHER): Payer: Medicare Other | Admitting: Family Medicine

## 2016-08-22 VITALS — BP 138/57 | HR 70 | Temp 97.3°F | Ht 63.0 in | Wt 177.5 lb

## 2016-08-22 DIAGNOSIS — F418 Other specified anxiety disorders: Secondary | ICD-10-CM | POA: Diagnosis not present

## 2016-08-22 DIAGNOSIS — E785 Hyperlipidemia, unspecified: Secondary | ICD-10-CM

## 2016-08-22 DIAGNOSIS — I10 Essential (primary) hypertension: Secondary | ICD-10-CM

## 2016-08-22 DIAGNOSIS — F329 Major depressive disorder, single episode, unspecified: Secondary | ICD-10-CM

## 2016-08-22 DIAGNOSIS — E1142 Type 2 diabetes mellitus with diabetic polyneuropathy: Secondary | ICD-10-CM

## 2016-08-22 DIAGNOSIS — F32A Depression, unspecified: Secondary | ICD-10-CM

## 2016-08-22 DIAGNOSIS — F419 Anxiety disorder, unspecified: Secondary | ICD-10-CM

## 2016-08-22 LAB — HEMOGLOBIN A1C: HEMOGLOBIN A1C: 9.3

## 2016-08-22 LAB — LIPID PANEL
CHOLESTEROL: 206 mg/dL — AB (ref 0–200)
HDL: 76 mg/dL — AB (ref 35–70)
LDL CALC: 107 mg/dL
TRIGLYCERIDES: 113 mg/dL (ref 40–160)

## 2016-08-22 LAB — BAYER DCA HB A1C WAIVED: HB A1C (BAYER DCA - WAIVED): 9.3 % — ABNORMAL HIGH (ref <7.0)

## 2016-08-22 MED ORDER — DULOXETINE HCL 60 MG PO CPEP: 60.0000 mg | ORAL_CAPSULE | Freq: Every day | ORAL | 3 refills | Status: DC

## 2016-08-22 NOTE — Progress Notes (Signed)
BP (!) 138/57   Pulse 70   Temp 97.3 F (36.3 C) (Oral)   Ht 5\' 3"  (1.6 m)   Wt 177 lb 8 oz (80.5 kg)   BMI 31.44 kg/m    Subjective:    Patient ID: Kristi Willis, female    DOB: 1941-02-11, 76 y.o.   MRN: [id number]  HPI: Kristi Willis is a 76 y.o. female presenting on 08/22/2016 for Hyperlipidemia (3 month followup); Hypertension; Diabetes; and Neurology appointment (wanted to let you know she saw the neurologist yesterday and received a good report)   HPI Hypertension recheck Patient is coming in today for hypertension recheck. Her blood pressure today is 138/57. She is currently on lisinopril and amlodipine. Patient denies headaches, blurred vision, chest pains, shortness of breath, or weakness. Denies any side effects from medication and is content with current medication.   Lipidemia Patient is coming in for cholesterol recheck today. She is currently on Crestor. Her last cholesterol levels were elevated but mainly in the triglycerides and not as much and LDL cholesterol. We're going to try diet and exercise and see how she does and then recheck it. Go ahead and do a recheck today. She is not fasting currently.  Type 2 diabetes recheck Patient is coming in today for type 2 diabetes recheck. She is currently on Actos and Januvia. Januvia was started 3 months ago when her A1c came back as 8.0. She has been intolerant of metformin because of gastrointestinal side effects previously and does not want to go back on it. She had an ophthalmology exam on May 2017 and is not due again until this coming May. Patient is on an ACE inhibitor/lisinopril. She denies any new issues with her feet. She has no neuropathy in her feet.  Anxiety and depression and neuropathy and insomnia Patient has been having issues with anxiety and depression and neuropathy for which she takes her Cymbalta. She also has been having more recently issues with insomnia, she will go sleep and sleep for 2-3  hours and wake up and she can usually go back to sleep but she just keeps waking up throughout the night. She cannot pinpoint any specific thing that wakes her up but the neurologist thought that it might be related to her anxiety and stress. She denies any suicidal ideations or thoughts of hurting herself. She says that the Cymbalta has helped significantly with her neuropathy and she would like to continue it.  Relevant past medical, surgical, family and social history reviewed and updated as indicated. Interim medical history since our last visit reviewed. Allergies and medications reviewed and updated.  Review of Systems  Constitutional: Negative for chills and fever.  Respiratory: Negative for chest tightness and shortness of breath.   Cardiovascular: Negative for chest pain and leg swelling.  Genitourinary: Negative for difficulty urinating and dysuria.  Musculoskeletal: Negative for back pain and gait problem.  Skin: Negative for rash.  Neurological: Positive for numbness. Negative for light-headedness and headaches.  Psychiatric/Behavioral: Positive for dysphoric mood and sleep disturbance. Negative for agitation, behavioral problems, self-injury and suicidal ideas. The patient is nervous/anxious.   All other systems reviewed and are negative.   Per HPI unless specifically indicated above     Objective:    BP (!) 138/57   Pulse 70   Temp 97.3 F (36.3 C) (Oral)   Ht 5\' 3"  (1.6 m)   Wt 177 lb 8 oz (80.5 kg)   BMI 31.44 kg/m   Wt Readings  from Last 3 Encounters:  08/22/16 177 lb 8 oz (80.5 kg)  06/10/16 172 lb 8 oz (78.2 kg)  06/05/16 171 lb (77.6 kg)    Physical Exam  Constitutional: She is oriented to person, place, and time. She appears well-developed and well-nourished. No distress.  Eyes: Conjunctivae are normal.  Cardiovascular: Normal rate, regular rhythm, normal heart sounds and intact distal pulses.   No murmur heard. Pulmonary/Chest: Effort normal and breath  sounds normal. No respiratory distress. She has no wheezes.  Musculoskeletal: Normal range of motion. She exhibits no edema or tenderness.  Neurological: She is alert and oriented to person, place, and time. Coordination normal.  Skin: Skin is warm and dry. No rash noted. She is not diaphoretic.  Psychiatric: Her behavior is normal. Judgment normal. Her mood appears anxious. She exhibits a depressed mood. She expresses no suicidal ideation. She expresses no suicidal plans.  Nursing note and vitals reviewed.       Assessment & Plan:   Problem List Items Addressed This Visit      Cardiovascular and Mediastinum   HTN (hypertension) - Primary     Endocrine   Diabetic neuropathy associated with type 2 diabetes mellitus ([place])   Relevant Orders   Bayer DCA Hb A1c Waived     Other   Hyperlipidemia   Relevant Orders   Lipid panel   Anxiety and depression   Relevant Medications   DULoxetine (CYMBALTA) 60 MG capsule       Follow up plan: Return if symptoms worsen or fail to improve.  Counseling provided for all of the vaccine components Orders Placed This Encounter  Procedures  . Lipid panel  . [facility] Hb A1c [place], MD [facility] 08/22/2016, 4:05 PM

## 2016-08-23 ENCOUNTER — Telehealth: Payer: Self-pay | Admitting: Family Medicine

## 2016-08-23 DIAGNOSIS — E1142 Type 2 diabetes mellitus with diabetic polyneuropathy: Secondary | ICD-10-CM

## 2016-08-23 LAB — LIPID PANEL
CHOL/HDL RATIO: 2.7 ratio (ref 0.0–4.4)
Cholesterol, Total: 206 mg/dL — ABNORMAL HIGH (ref 100–199)
HDL: 76 mg/dL (ref >39)
LDL Calculated: 107 mg/dL — ABNORMAL HIGH (ref 0–99)
Triglycerides: 113 mg/dL (ref 0–149)
VLDL Cholesterol Cal: 23 mg/dL (ref 5–40)

## 2016-08-23 MED ORDER — SITAGLIP PHOS-METFORMIN HCL ER 50-1000 MG PO TB24: 1.0000 | ORAL_TABLET | Freq: Two times a day (BID) | ORAL | 2 refills | Status: DC

## 2016-08-23 NOTE — Telephone Encounter (Signed)
Aware of changes. Would like referral to endocrinologist. Please place referral if approved.

## 2016-08-23 NOTE — Telephone Encounter (Signed)
Go ahead and refer to endocrinology

## 2016-08-23 NOTE — Telephone Encounter (Signed)
Referral placed.

## 2016-09-12 ENCOUNTER — Ambulatory Visit: Payer: Medicare Other | Admitting: Neurology

## 2016-09-27 ENCOUNTER — Ambulatory Visit (INDEPENDENT_AMBULATORY_CARE_PROVIDER_SITE_OTHER): Payer: Medicare Other | Admitting: "Endocrinology

## 2016-09-27 ENCOUNTER — Encounter: Payer: Self-pay | Admitting: "Endocrinology

## 2016-09-27 VITALS — BP 144/74 | HR 68 | Ht 63.0 in | Wt 176.0 lb

## 2016-09-27 DIAGNOSIS — E1165 Type 2 diabetes mellitus with hyperglycemia: Secondary | ICD-10-CM | POA: Diagnosis not present

## 2016-09-27 DIAGNOSIS — E1140 Type 2 diabetes mellitus with diabetic neuropathy, unspecified: Secondary | ICD-10-CM | POA: Diagnosis not present

## 2016-09-27 DIAGNOSIS — E782 Mixed hyperlipidemia: Secondary | ICD-10-CM | POA: Diagnosis not present

## 2016-09-27 DIAGNOSIS — E119 Type 2 diabetes mellitus without complications: Secondary | ICD-10-CM | POA: Insufficient documentation

## 2016-09-27 DIAGNOSIS — I10 Essential (primary) hypertension: Secondary | ICD-10-CM

## 2016-09-27 DIAGNOSIS — IMO0002 Reserved for concepts with insufficient information to code with codable children: Secondary | ICD-10-CM | POA: Insufficient documentation

## 2016-09-27 NOTE — Progress Notes (Signed)
Subjective:    Patient ID: Kristi Willis, female    DOB: 1941/05/06. Patient is being seen in consultation for management of diabetes requested by  Worthy Rancher, MD  Past Medical History:  Diagnosis Date  . Allergy   . Asthma   . Cancer ([place])    skin  . Diabetes mellitus   . GERD (gastroesophageal reflux disease)    hiatal hernia  . Hypercholesteremia   . Hypertension   . Neuromuscular disorder ([facility])    DM neuropathy  . Neuropathy ([place])   . SVT (supraventricular tachycardia) ([facility])    Past Surgical History:  Procedure Laterality Date  . BREAST BIOPSY     left breast biopsy  . LUMBAR LAMINECTOMY  08/10  . SKIN CANCER EXCISION  2005  . SPINE SURGERY     lumbar laminectomy   Social History   Social History  . Marital status: Widowed    Spouse name: N/A  . Number of children: N/A  . Years of education: N/A   Occupational History  . Retired from [employer] but still works part-time    Social History Main Topics  . Smoking status: Never Smoker  . Smokeless tobacco: Never Used  . Alcohol use No  . Drug use: No  . Sexual activity: No   Other Topics Concern  . None   Social History Narrative   Lives with husband, who is a patient of Dr Lovena Le. She is active around the house and exercises regularly.   Outpatient Encounter Prescriptions as of 09/27/2016  Medication Sig  . acyclovir ointment (ZOVIRAX) 5 % Apply 1 application topically every 3 (three) hours. Around mouth, Until symptoms clear  . albuterol (VENTOLIN HFA) 108 (90 BASE) MCG/ACT inhaler Inhale 2 puffs into the lungs every 6 (six) hours as needed for wheezing.  Marland Kitchen amLODipine (NORVASC) 5 MG tablet TAKE 1 TABLET ONCE DAILY  . aspirin 81 MG chewable tablet Chew 81 mg by mouth daily.  Marland Kitchen azelastine (ASTELIN) 0.1 % nasal spray Place 2 sprays into both nostrils 2 (two) times daily.  . Blood Glucose Monitoring Suppl (ACCU-CHEK AVIVA PLUS) w/Device KIT CHECK BLOOD SUGAR UP TO 4 TIMES A DAY  . Calcium  Carbonate-Vitamin D (CALTRATE 600+D) 600-400 MG-UNIT per tablet Take 1 tablet by mouth 2 (two) times daily.  . Cholecalciferol (VITAMIN D3) 1000 UNITS CAPS Take by mouth daily.    Marland Kitchen doxazosin (CARDURA) 8 MG tablet Take 1 tablet (8 mg total) by mouth daily. For blood pressure  . DULoxetine (CYMBALTA) 60 MG capsule Take 1 capsule (60 mg total) by mouth daily.  Marland Kitchen EPIPEN 2-PAK 0.3 MG/0.3ML SOAJ injection Reported on 01/24/2016  . lisinopril (PRINIVIL,ZESTRIL) 40 MG tablet TAKE 1 TABLET DAILY  . loratadine (CLARITIN) 10 MG tablet Take 1 tablet (10 mg total) by mouth daily.  . metFORMIN (GLUCOPHAGE) 1000 MG tablet Take 1,000 mg by mouth 2 (two) times daily with a meal.  . metoprolol succinate (TOPROL-XL) 100 MG 24 hr tablet TAKE  (1)  TABLET TWICE A DAY.  . montelukast (SINGULAIR) 10 MG tablet Take 1 tablet (10 mg total) by mouth at bedtime.  . Multiple Vitamin (MULTIVITAMIN) tablet Take 1 tablet by mouth daily.    . Omega-3 Fatty Acids (FISH OIL) 1200 MG CAPS Take by mouth.  . Pyridoxine HCl (VITAMIN B-6 PO) Take 1 tablet by mouth 2 (two) times daily. Reported on 01/24/2016  . rosuvastatin (CRESTOR) 10 MG tablet TAKE 1 TABLET DAILY  . vitamin B-12 (  CYANOCOBALAMIN) 500 MCG tablet Take 500 mcg by mouth daily.    . Vitamin D, Ergocalciferol, (DRISDOL) 50000 units CAPS capsule TAKE 1 CAPSULE ONCE A WEEK  . [DISCONTINUED] pioglitazone (ACTOS) 15 MG tablet TAKE 1 TABLET DAILY  . SitaGLIPtin-MetFORMIN HCl (JANUMET XR) 50-1000 MG TB24 Take 1 tablet by mouth 2 (two) times daily. (Patient not taking: Reported on 09/27/2016)   No facility-administered encounter medications on file as of 09/27/2016.    ALLERGIES: Allergies  Allergen Reactions  . Dimetapp C [Phenylephrine-Bromphen-Codeine] Itching  . Phenylephrine Hcl Other (See Comments)    unknown  . Ultram [Tramadol Hcl] Other (See Comments)    Insomnia  . Celebrex [Celecoxib] Rash  . Cortisone Rash  . Fenofibrate Rash  . Sulfa Antibiotics Rash    VACCINATION STATUS: Immunization History  Administered Date(s) Administered  . Influenza, High Dose Seasonal PF 06/13/2016  . Influenza,inj,Quad PF,36+ Mos 05/20/2013, 05/31/2014, 07/18/2015  . Influenza-Unspecified 08/20/2011  . Pneumococcal Conjugate-13 05/31/2014  . Pneumococcal Polysaccharide-23 07/22/2012  . Tdap 02/02/2015    Diabetes  She presents for her initial diabetic visit. She has type 2 diabetes mellitus. Onset time: She was diagnosed at approximate age of 53 years. Her disease course has been worsening. There are no hypoglycemic associated symptoms. Pertinent negatives for hypoglycemia include no confusion, headaches, pallor or seizures. Associated symptoms include fatigue, foot paresthesias, polydipsia and polyuria. Pertinent negatives for diabetes include no chest pain and no polyphagia. There are no hypoglycemic complications. Symptoms are worsening. Diabetic complications include peripheral neuropathy. Risk factors for coronary artery disease include diabetes mellitus, dyslipidemia, hypertension and sedentary lifestyle. Current diabetic treatments: Metformin 1000 g by mouth twice a day. Her weight is increasing steadily. She is following a generally unhealthy diet. When asked about meal planning, she reported none. She has not had a previous visit with a dietitian. She rarely participates in exercise. (She did not bring any meter nor logs to review today. She admits he does not monitor blood glucose regularly.) An ACE inhibitor/angiotensin II receptor blocker is being taken. She sees a podiatrist.Eye exam is current.  Hyperlipidemia  This is a chronic problem. The current episode started more than 1 year ago. The problem is uncontrolled. Recent lipid tests were reviewed and are high. Exacerbating diseases include diabetes. Pertinent negatives include no chest pain, myalgias or shortness of breath. Current antihyperlipidemic treatment includes statins. Risk factors for coronary  artery disease include dyslipidemia, diabetes mellitus, hypertension and a sedentary lifestyle.  Hypertension  This is a chronic problem. The current episode started more than 1 year ago. The problem is controlled. Pertinent negatives include no chest pain, headaches, palpitations or shortness of breath. Risk factors for coronary artery disease include diabetes mellitus, dyslipidemia, sedentary lifestyle and family history. Past treatments include ACE inhibitors and beta blockers.       Review of Systems  Constitutional: Positive for fatigue. Negative for chills, fever and unexpected weight change.  HENT: Negative for trouble swallowing and voice change.   Eyes: Negative for visual disturbance.  Respiratory: Negative for cough, shortness of breath and wheezing.   Cardiovascular: Negative for chest pain, palpitations and leg swelling.  Gastrointestinal: Negative for diarrhea, nausea and vomiting.  Endocrine: Positive for polydipsia and polyuria. Negative for cold intolerance, heat intolerance and polyphagia.  Musculoskeletal: Negative for arthralgias and myalgias.  Skin: Negative for color change, pallor, rash and wound.  Neurological: Negative for seizures and headaches.  Psychiatric/Behavioral: Negative for confusion and suicidal ideas.    Objective:    BP Marland Kitchen)  144/74   Pulse 68   Ht '5\' 3"'$  (1.6 m)   Wt 176 lb (79.8 kg)   BMI 31.18 kg/m   Wt Readings from Last 3 Encounters:  09/27/16 176 lb (79.8 kg)  08/22/16 177 lb 8 oz (80.5 kg)  06/10/16 172 lb 8 oz (78.2 kg)    Physical Exam  Constitutional: She is oriented to person, place, and time. She appears well-developed.  HENT:  Head: Normocephalic and atraumatic.  Eyes: EOM are normal.  Neck: Normal range of motion. Neck supple. No tracheal deviation present. No thyromegaly present.  Cardiovascular: Normal rate and regular rhythm.   Pulmonary/Chest: Effort normal and breath sounds normal.  Abdominal: Soft. Bowel sounds are  normal. There is no tenderness. There is no guarding.  Musculoskeletal: Normal range of motion. She exhibits no edema.  Neurological: She is alert and oriented to person, place, and time. She has normal reflexes. No cranial nerve deficit. Coordination normal.  Skin: Skin is warm and dry. No rash noted. No erythema. No pallor.  Psychiatric: She has a normal mood and affect. Judgment normal.    CMP     Component Value Date/Time   NA 139 05/20/2016 1602   K 4.3 05/20/2016 1602   CL 97 05/20/2016 1602   CO2 27 05/20/2016 1602   GLUCOSE 202 (H) 05/20/2016 1602   GLUCOSE 187 (H) 02/04/2016 1934   BUN 13 05/20/2016 1602   CREATININE 0.58 05/20/2016 1602   CREATININE 0.54 03/02/2013 0931   CALCIUM 9.2 05/20/2016 1602   PROT 6.5 01/24/2016 0853   ALBUMIN 4.2 01/24/2016 0853   AST 51 (H) 01/24/2016 0853   ALT 35 (H) 01/24/2016 0853   ALKPHOS 66 01/24/2016 0853   BILITOT 0.4 01/24/2016 0853   GFRNONAA 91 05/20/2016 1602   GFRNONAA >89 03/02/2013 0931   GFRAA 105 05/20/2016 1602   GFRAA >89 03/02/2013 0931     Diabetic Labs (most recent): Lab Results  Component Value Date   HGBA1C 9.3 08/22/2016   HGBA1C 8.9 10/13/2015   HGBA1C 7.9 07/18/2015     Lipid Panel ( most recent) Lipid Panel     Component Value Date/Time   CHOL 206 (H) 08/22/2016 1613   CHOL 177 02/23/2013 0911   TRIG 113 08/22/2016 1613   TRIG 203 (H) 01/12/2015 0915   TRIG 293 (H) 02/23/2013 0911   HDL 76 08/22/2016 1613   HDL 40 01/12/2015 0915   HDL 48 02/23/2013 0911   CHOLHDL 2.7 08/22/2016 1613   LDLCALC 107 (H) 08/22/2016 1613   LDLCALC 119 (H) 10/06/2013 1012   LDLCALC 70 02/23/2013 0911      Assessment & Plan:   1. Uncontrolled type 2 diabetes mellitus with diabetic neuropathy, without long-term current use of insulin ([place])   - Patient has currently uncontrolled symptomatic type 2 DM since  76 years of age,  with most recent A1c of 9.3 %. Recent labs reviewed.   Her diabetes is complicated by  referral neuropathy and patient remains at a high risk for more acute and chronic complications of diabetes which include CAD, CVA, CKD, retinopathy, and neuropathy. These are all discussed in detail with the patient.  - I have counseled the patient on diet management and weight loss, by adopting a carbohydrate restricted/protein rich diet.  - Suggestion is made for patient to avoid simple carbohydrates   from their diet including Cakes , Desserts, Ice Cream,  Soda (  diet and regular) , Sweet Tea , Candies,  Chips, Cookies,  Artificial Sweeteners,   and "Sugar-free" Products . This will help patient to have stable blood glucose profile and potentially avoid unintended weight gain.  - I encouraged the patient to switch to  unprocessed or minimally processed complex starch and increased protein intake (animal or plant source), fruits, and vegetables.  - Patient is advised to stick to a routine mealtimes to eat 3 meals  a day and avoid unnecessary snacks ( to snack only to correct hypoglycemia).  - The patient will be scheduled with Jearld Fenton, RDN, CDE for individualized DM education.  - I have approached patient with the following individualized plan to manage diabetes and patient agrees:   - I  will proceed to initiate strict monitoring of glucose 4 times a day-before meals and at bedtime and return in one week with her meter and logs. - Depending on her blood glucose profile in her commitment she may require at least basal insulin to treat her diabetes.  -Patient is encouraged to call clinic for blood glucose levels less than 70 or above 300 mg /dl. - I will continue metformin 1000 g by mouth twice a day, therapeutically suitable for patient. - I will discontinue Actos, risk outweighs benefit for this patient.  - Patient specific target  A1c;  LDL, HDL, Triglycerides, and  Waist Circumference were discussed in detail.  2) BP/HTN: Controlled. Continue current medications including  ACEI/ARB. 3) Lipids/HPL:   Uncontrolled.   Patient is advised tocontinue statins. 4)  Weight/Diet: CDE Consult will be initiated , exercise, and detailed carbohydrates information provided.  5) Chronic Care/Health Maintenance:  -Patient is on ACEI/ARB and Statin medications and encouraged to continue to follow up with Ophthalmology, Podiatrist at least yearly or according to recommendations, and advised to  stay away from smoking. I have recommended yearly flu vaccine and pneumonia vaccination at least every 5 years; moderate intensity exercise for up to 150 minutes weekly; and  sleep for at least 7 hours a day.  - 60 minutes of time was spent on the care of this patient , 50% of which was applied for counseling on diabetes complications and their preventions.  - Patient to bring meter and  blood glucose logs during her next visit.  - I advised patient to maintain close follow up with Worthy Rancher, MD for primary care needs.  Follow up plan: - Return in about 1 week (around 10/04/2016) for follow up with meter and logs- no labs.  Glade Lloyd, MD Phone: [phone]  Fax: [phone]   09/27/2016, 9:52 AM

## 2016-09-27 NOTE — Patient Instructions (Signed)

## 2016-10-01 ENCOUNTER — Other Ambulatory Visit: Payer: Self-pay | Admitting: Family Medicine

## 2016-10-02 NOTE — Telephone Encounter (Signed)
Last vit D level 09/2015

## 2016-10-07 ENCOUNTER — Ambulatory Visit (INDEPENDENT_AMBULATORY_CARE_PROVIDER_SITE_OTHER): Payer: Medicare Other | Admitting: "Endocrinology

## 2016-10-07 ENCOUNTER — Encounter: Payer: Self-pay | Admitting: "Endocrinology

## 2016-10-07 VITALS — BP 145/70 | HR 71 | Ht 63.0 in | Wt 176.0 lb

## 2016-10-07 DIAGNOSIS — E1165 Type 2 diabetes mellitus with hyperglycemia: Secondary | ICD-10-CM

## 2016-10-07 DIAGNOSIS — I10 Essential (primary) hypertension: Secondary | ICD-10-CM

## 2016-10-07 DIAGNOSIS — IMO0002 Reserved for concepts with insufficient information to code with codable children: Secondary | ICD-10-CM

## 2016-10-07 DIAGNOSIS — E1140 Type 2 diabetes mellitus with diabetic neuropathy, unspecified: Secondary | ICD-10-CM

## 2016-10-07 DIAGNOSIS — E782 Mixed hyperlipidemia: Secondary | ICD-10-CM | POA: Diagnosis not present

## 2016-10-07 MED ORDER — SITAGLIPTIN PHOSPHATE 25 MG PO TABS: 25.0000 mg | ORAL_TABLET | Freq: Every day | ORAL | 3 refills | Status: DC

## 2016-10-07 NOTE — Patient Instructions (Signed)

## 2016-10-07 NOTE — Progress Notes (Signed)
Subjective:    Patient ID: Kristi Willis, female    DOB: 1941/06/17. Patient is being seen in f/u  for management of diabetes requested by  Worthy Rancher, MD  Past Medical History:  Diagnosis Date  . Allergy   . Asthma   . Cancer ([place])    skin  . Diabetes mellitus   . GERD (gastroesophageal reflux disease)    hiatal hernia  . Hypercholesteremia   . Hypertension   . Neuromuscular disorder ([facility])    DM neuropathy  . Neuropathy ([place])   . SVT (supraventricular tachycardia) (HCC)    Past Surgical History:  Procedure Laterality Date  . BREAST BIOPSY     left breast biopsy  . LUMBAR LAMINECTOMY  08/10  . SKIN CANCER EXCISION  2005  . SPINE SURGERY     lumbar laminectomy   Social History   Social History  . Marital status: Widowed    Spouse name: N/A  . Number of children: N/A  . Years of education: N/A   Occupational History  . Retired from [employer] but still works part-time    Social History Main Topics  . Smoking status: Never Smoker  . Smokeless tobacco: Never Used  . Alcohol use No  . Drug use: No  . Sexual activity: No   Other Topics Concern  . None   Social History Narrative   Lives with husband, who is a patient of Dr Lovena Le. She is active around the house and exercises regularly.   Outpatient Encounter Prescriptions as of 10/07/2016  Medication Sig  . acyclovir ointment (ZOVIRAX) 5 % Apply 1 application topically every 3 (three) hours. Around mouth, Until symptoms clear  . albuterol (VENTOLIN HFA) 108 (90 BASE) MCG/ACT inhaler Inhale 2 puffs into the lungs every 6 (six) hours as needed for wheezing.  Marland Kitchen amLODipine (NORVASC) 5 MG tablet TAKE 1 TABLET ONCE DAILY  . aspirin 81 MG chewable tablet Chew 81 mg by mouth daily.  Marland Kitchen azelastine (ASTELIN) 0.1 % nasal spray Place 2 sprays into both nostrils 2 (two) times daily.  . Blood Glucose Monitoring Suppl (ACCU-CHEK AVIVA PLUS) w/Device KIT CHECK BLOOD SUGAR UP TO 4 TIMES A DAY  . Calcium  Carbonate-Vitamin D (CALTRATE 600+D) 600-400 MG-UNIT per tablet Take 1 tablet by mouth 2 (two) times daily.  . Cholecalciferol (VITAMIN D3) 1000 UNITS CAPS Take by mouth daily.    Marland Kitchen doxazosin (CARDURA) 8 MG tablet Take 1 tablet (8 mg total) by mouth daily. For blood pressure  . DULoxetine (CYMBALTA) 60 MG capsule Take 1 capsule (60 mg total) by mouth daily.  Marland Kitchen EPIPEN 2-PAK 0.3 MG/0.3ML SOAJ injection Reported on 01/24/2016  . lisinopril (PRINIVIL,ZESTRIL) 40 MG tablet TAKE 1 TABLET DAILY  . loratadine (CLARITIN) 10 MG tablet Take 1 tablet (10 mg total) by mouth daily.  . metFORMIN (GLUCOPHAGE) 1000 MG tablet Take 1,000 mg by mouth 2 (two) times daily with a meal.  . metoprolol succinate (TOPROL-XL) 100 MG 24 hr tablet TAKE  (1)  TABLET TWICE A DAY.  . montelukast (SINGULAIR) 10 MG tablet Take 1 tablet (10 mg total) by mouth at bedtime.  . Multiple Vitamin (MULTIVITAMIN) tablet Take 1 tablet by mouth daily.    . Omega-3 Fatty Acids (FISH OIL) 1200 MG CAPS Take by mouth.  . Pyridoxine HCl (VITAMIN B-6 PO) Take 1 tablet by mouth 2 (two) times daily. Reported on 01/24/2016  . rosuvastatin (CRESTOR) 10 MG tablet TAKE 1 TABLET DAILY  . sitaGLIPtin (  JANUVIA) 25 MG tablet Take 1 tablet (25 mg total) by mouth daily.  . vitamin B-12 (CYANOCOBALAMIN) 500 MCG tablet Take 500 mcg by mouth daily.    . Vitamin D, Ergocalciferol, (DRISDOL) 50000 units CAPS capsule TAKE 1 CAPSULE ONCE A WEEK  . [DISCONTINUED] SitaGLIPtin-MetFORMIN HCl (JANUMET XR) 50-1000 MG TB24 Take 1 tablet by mouth 2 (two) times daily. (Patient not taking: Reported on 09/27/2016)   No facility-administered encounter medications on file as of 10/07/2016.    ALLERGIES: Allergies  Allergen Reactions  . Dimetapp C [Phenylephrine-Bromphen-Codeine] Itching  . Phenylephrine Hcl Other (See Comments)    unknown  . Ultram [Tramadol Hcl] Other (See Comments)    Insomnia  . Celebrex [Celecoxib] Rash  . Cortisone Rash  . Fenofibrate Rash  . Sulfa  Antibiotics Rash   VACCINATION STATUS: Immunization History  Administered Date(s) Administered  . Influenza, High Dose Seasonal PF 06/13/2016  . Influenza,inj,Quad PF,36+ Mos 05/20/2013, 05/31/2014, 07/18/2015  . Influenza-Unspecified 08/20/2011  . Pneumococcal Conjugate-13 05/31/2014  . Pneumococcal Polysaccharide-23 07/22/2012  . Tdap 02/02/2015    Diabetes  She presents for her follow-up diabetic visit. She has type 2 diabetes mellitus. Onset time: She was diagnosed at approximate age of 49 years. Her disease course has been improving. There are no hypoglycemic associated symptoms. Pertinent negatives for hypoglycemia include no confusion, headaches, pallor or seizures. Associated symptoms include fatigue, foot paresthesias, polydipsia and polyuria. Pertinent negatives for diabetes include no chest pain and no polyphagia. There are no hypoglycemic complications. Symptoms are improving. Diabetic complications include peripheral neuropathy. Risk factors for coronary artery disease include diabetes mellitus, dyslipidemia, hypertension and sedentary lifestyle. Current diabetic treatments: Metformin 1000 g by mouth twice a day. Her weight is stable. She is following a generally unhealthy diet. When asked about meal planning, she reported none. She has not had a previous visit with a dietitian. She rarely participates in exercise. Her breakfast blood glucose range is generally 140-180 mg/dl. Her lunch blood glucose range is generally 140-180 mg/dl. Her dinner blood glucose range is generally 140-180 mg/dl. Her overall blood glucose range is 140-180 mg/dl. An ACE inhibitor/angiotensin II receptor blocker is being taken. She sees a podiatrist.Eye exam is current.  Hyperlipidemia  This is a chronic problem. The current episode started more than 1 year ago. The problem is uncontrolled. Recent lipid tests were reviewed and are high. Exacerbating diseases include diabetes. Pertinent negatives include no chest  pain, myalgias or shortness of breath. Current antihyperlipidemic treatment includes statins. Risk factors for coronary artery disease include dyslipidemia, diabetes mellitus, hypertension and a sedentary lifestyle.  Hypertension  This is a chronic problem. The current episode started more than 1 year ago. The problem is controlled. Pertinent negatives include no chest pain, headaches, palpitations or shortness of breath. Risk factors for coronary artery disease include diabetes mellitus, dyslipidemia, sedentary lifestyle and family history. Past treatments include ACE inhibitors and beta blockers.       Review of Systems  Constitutional: Positive for fatigue. Negative for chills, fever and unexpected weight change.  HENT: Negative for trouble swallowing and voice change.   Eyes: Negative for visual disturbance.  Respiratory: Negative for cough, shortness of breath and wheezing.   Cardiovascular: Negative for chest pain, palpitations and leg swelling.  Gastrointestinal: Negative for diarrhea, nausea and vomiting.  Endocrine: Positive for polydipsia and polyuria. Negative for cold intolerance, heat intolerance and polyphagia.  Musculoskeletal: Negative for arthralgias and myalgias.  Skin: Negative for color change, pallor, rash and wound.  Neurological: Negative for  seizures and headaches.  Psychiatric/Behavioral: Negative for confusion and suicidal ideas.    Objective:    BP (!) 145/70   Pulse 71   Ht '5\' 3"'  (1.6 m)   Wt 176 lb (79.8 kg)   BMI 31.18 kg/m   Wt Readings from Last 3 Encounters:  10/07/16 176 lb (79.8 kg)  09/27/16 176 lb (79.8 kg)  08/22/16 177 lb 8 oz (80.5 kg)    Physical Exam  Constitutional: She is oriented to person, place, and time. She appears well-developed.  HENT:  Head: Normocephalic and atraumatic.  Eyes: EOM are normal.  Neck: Normal range of motion. Neck supple. No tracheal deviation present. No thyromegaly present.  Cardiovascular: Normal rate and  regular rhythm.   Pulmonary/Chest: Effort normal and breath sounds normal.  Abdominal: Soft. Bowel sounds are normal. There is no tenderness. There is no guarding.  Musculoskeletal: Normal range of motion. She exhibits no edema.  Neurological: She is alert and oriented to person, place, and time. She has normal reflexes. No cranial nerve deficit. Coordination normal.  Skin: Skin is warm and dry. No rash noted. No erythema. No pallor.  Psychiatric: She has a normal mood and affect. Judgment normal.    CMP     Component Value Date/Time   NA 139 05/20/2016 1602   K 4.3 05/20/2016 1602   CL 97 05/20/2016 1602   CO2 27 05/20/2016 1602   GLUCOSE 202 (H) 05/20/2016 1602   GLUCOSE 187 (H) 02/04/2016 1934   BUN 13 05/20/2016 1602   CREATININE 0.58 05/20/2016 1602   CREATININE 0.54 03/02/2013 0931   CALCIUM 9.2 05/20/2016 1602   PROT 6.5 01/24/2016 0853   ALBUMIN 4.2 01/24/2016 0853   AST 51 (H) 01/24/2016 0853   ALT 35 (H) 01/24/2016 0853   ALKPHOS 66 01/24/2016 0853   BILITOT 0.4 01/24/2016 0853   GFRNONAA 91 05/20/2016 1602   GFRNONAA >89 03/02/2013 0931   GFRAA 105 05/20/2016 1602   GFRAA >89 03/02/2013 0931     Diabetic Labs (most recent): Lab Results  Component Value Date   HGBA1C 9.3 08/22/2016   HGBA1C 8.9 10/13/2015   HGBA1C 7.9 07/18/2015     Lipid Panel ( most recent) Lipid Panel     Component Value Date/Time   CHOL 206 (H) 08/22/2016 1613   CHOL 177 02/23/2013 0911   TRIG 113 08/22/2016 1613   TRIG 203 (H) 01/12/2015 0915   TRIG 293 (H) 02/23/2013 0911   HDL 76 08/22/2016 1613   HDL 40 01/12/2015 0915   HDL 48 02/23/2013 0911   CHOLHDL 2.7 08/22/2016 1613   LDLCALC 107 (H) 08/22/2016 1613   LDLCALC 119 (H) 10/06/2013 1012   LDLCALC 70 02/23/2013 0911      Assessment & Plan:   1. Uncontrolled type 2 diabetes mellitus with diabetic neuropathy, without long-term current use of insulin ([place])   - Patient has currently uncontrolled symptomatic type 2 DM  since  76 years of age,  with most recent A1c of 9.3 %. Recent labs reviewed.   Her diabetes is complicated by referral neuropathy and patient remains at a high risk for more acute and chronic complications of diabetes which include CAD, CVA, CKD, retinopathy, and neuropathy. These are all discussed in detail with the patient.  - I have counseled the patient on diet management and weight loss, by adopting a carbohydrate restricted/protein rich diet.  - Suggestion is made for patient to avoid simple carbohydrates   from their diet including Cakes ,  Desserts, Ice Cream,  Soda (  diet and regular) , Sweet Tea , Candies,  Chips, Cookies, Artificial Sweeteners,   and "Sugar-free" Products . This will help patient to have stable blood glucose profile and potentially avoid unintended weight gain.  - I encouraged the patient to switch to  unprocessed or minimally processed complex starch and increased protein intake (animal or plant source), fruits, and vegetables.  - Patient is advised to stick to a routine mealtimes to eat 3 meals  a day and avoid unnecessary snacks ( to snack only to correct hypoglycemia).  - The patient will be scheduled with Jearld Fenton, RDN, CDE for individualized DM education.  - I have approached patient with the following individualized plan to manage diabetes and patient agrees:   -  Based on her blood glucose readings, she would not require insulin treatment at this time.  - She will continue to test blood glucose as needed. - I will continue metformin 1000 g by mouth twice a day, therapeutically suitable for patient. - I will add Januvia 25 mg by mouth every morning. Side effects and precautions discussed with her.  - Patient specific target  A1c;  LDL, HDL, Triglycerides, and  Waist Circumference were discussed in detail.  2) BP/HTN: uncontrolled. Continue current medications including ACEI/ARB. 3) Lipids/HPL:   Uncontrolled.   Patient is advised tocontinue  statins. 4)  Weight/Diet: CDE Consult has been initiated , exercise, and detailed carbohydrates information provided.  5) Chronic Care/Health Maintenance:  -Patient is on ACEI/ARB and Statin medications and encouraged to continue to follow up with Ophthalmology, Podiatrist at least yearly or according to recommendations, and advised to  stay away from smoking. I have recommended yearly flu vaccine and pneumonia vaccination at least every 5 years; moderate intensity exercise for up to 150 minutes weekly; and  sleep for at least 7 hours a day.  - 30 minutes of time was spent on the care of this patient , 50% of which was applied for counseling on diabetes complications and their preventions.  - Patient to bring meter and  blood glucose logs during her next visit.  - I advised patient to maintain close follow up with Worthy Rancher, MD for primary care needs.  Follow up plan: - Return in about 10 weeks (around 12/16/2016) for follow up with pre-visit labs.  Glade Lloyd, MD Phone: [phone]  Fax: [phone]   10/07/2016, 11:41 AM

## 2016-10-10 ENCOUNTER — Encounter: Payer: Medicare Other | Attending: Family Medicine | Admitting: Nutrition

## 2016-10-10 VITALS — Ht 63.0 in | Wt 177.0 lb

## 2016-10-10 DIAGNOSIS — Z713 Dietary counseling and surveillance: Secondary | ICD-10-CM | POA: Insufficient documentation

## 2016-10-10 DIAGNOSIS — E118 Type 2 diabetes mellitus with unspecified complications: Secondary | ICD-10-CM

## 2016-10-10 DIAGNOSIS — E1140 Type 2 diabetes mellitus with diabetic neuropathy, unspecified: Secondary | ICD-10-CM | POA: Diagnosis present

## 2016-10-10 DIAGNOSIS — IMO0002 Reserved for concepts with insufficient information to code with codable children: Secondary | ICD-10-CM

## 2016-10-10 DIAGNOSIS — E1165 Type 2 diabetes mellitus with hyperglycemia: Secondary | ICD-10-CM | POA: Insufficient documentation

## 2016-10-10 NOTE — Progress Notes (Addendum)
Diabetes Self-Management Education  Visit Type: First/Initial  Appt. Start Time:1000 Appt. End Time: 1100  10/10/2016  [name], identified by name and date of birth, is a 76 y.o. female with a diagnosis of Diabetes: Type 2. LIves by herself. BS are avg 200's. Eats 3 meals per day and snacks some. Walks most days of week.   ASSESSMENT  Height 5\' 3"  (1.6 m), weight 177 lb (80.3 kg). Body mass index is 31.35 kg/m.      Diabetes Self-Management Education - 10/10/16 1005      Visit Information   Visit Type First/Initial     Initial Visit   Diabetes Type Type 2   Are you currently following a meal plan? No   Are you taking your medications as prescribed? Yes   Date Diagnosed 2014     Health Coping   How would you rate your overall health? Good     Psychosocial Assessment   Patient Belief/Attitude about Diabetes Motivated to manage diabetes   Self-care barriers None   Self-management support Family;Doctor's office   Other persons present Patient   Patient Concerns Nutrition/Meal planning;Medication;Monitoring;Healthy Lifestyle;Glycemic Control;Weight Control   Special Needs None   Preferred Learning Style No preference indicated   Learning Readiness Not Ready   How often do you need to have someone help you when you read instructions, pamphlets, or other written materials from your doctor or pharmacy? 1 - Never   What is the last grade level you completed in school? 12     Pre-Education Assessment   Patient understands the diabetes disease and treatment process. Needs Review   Patient understands incorporating nutritional management into lifestyle. Needs Review   Patient undertands incorporating physical activity into lifestyle. Needs Review   Patient understands using medications safely. Needs Review   Patient understands monitoring blood glucose, interpreting and using results Needs Review   Patient understands prevention, detection, and treatment of acute  complications. Needs Review   Patient understands prevention, detection, and treatment of chronic complications. Needs Review   Patient understands how to develop strategies to address psychosocial issues. Needs Review   Patient understands how to develop strategies to promote health/change behavior. Needs Review     Complications   Last HgB A1C per patient/outside source 9.3 %   Fasting Blood glucose range (mg/dL) 130-179   Postprandial Blood glucose range (mg/dL) 180-200   Have you had a dilated eye exam in the past 12 months? Yes   Have you had a dental exam in the past 12 months? Yes   Are you checking your feet? Yes     Dietary Intake   Breakfast egg and 2 tangelos and toast   Lunch  Vegetable soup, water, crackers,   Dinner Hot dog with bun and fries, water   Beverage(s) water, or unsweet tea     Exercise   Exercise Type ADL's     Patient Education   Disease state  Definition of diabetes, type 1 and 2, and the diagnosis of diabetes;Factors that contribute to the development of diabetes;Explored patient's options for treatment of their diabetes   Nutrition management  Role of diet in the treatment of diabetes and the relationship between the three main macronutrients and blood glucose level;Food label reading, portion sizes and measuring food.;Carbohydrate counting;Reviewed blood glucose goals for pre and post meals and how to evaluate the patients' food intake on their blood glucose level.;Meal timing in regards to the patients' current diabetes medication.;Effects of alcohol on blood glucose and  safety factors with consumption of alcohol.;Information on hints to eating out and maintain blood glucose control.;Meal options for control of blood glucose level and chronic complications.   Physical activity and exercise  Role of exercise on diabetes management, blood pressure control and cardiac health.;Identified with patient nutritional and/or medication changes necessary with exercise.    Medications Reviewed patients medication for diabetes, action, purpose, timing of dose and side effects.;Reviewed medication adjustment guidelines for hyperglycemia and sick days.   Monitoring Taught/evaluated SMBG meter.;Purpose and frequency of SMBG.;Taught/discussed recording of test results and interpretation of SMBG.;Interpreting lab values - A1C, lipid, urine microalbumina.;Identified appropriate SMBG and/or A1C goals.   Acute complications Trained/discussed glucagon administration to patient and designated other.;Taught treatment of hypoglycemia - the 15 rule.;Discussed and identified patients' treatment of hyperglycemia.   Chronic complications Relationship between chronic complications and blood glucose control;Assessed and discussed foot care and prevention of foot problems;Lipid levels, blood glucose control and heart disease;Retinopathy and reason for yearly dilated eye exams;Nephropathy, what it is, prevention of, the use of ACE, ARB's and early detection of through urine microalbumia.;Reviewed with patient heart disease, higher risk of, and prevention;Identified and discussed with patient  current chronic complications   Psychosocial adjustment Worked with patient to identify barriers to care and solutions;Role of stress on diabetes;Travel strategies   Personal strategies to promote health Lifestyle issues that need to be addressed for better diabetes care;Helped patient develop diabetes management plan for (enter comment)     Individualized Goals (developed by patient)   Nutrition Follow meal plan discussed;General guidelines for healthy choices and portions discussed;Adjust meds/carbs with exercise as discussed   Physical Activity Exercise 3-5 times per week   Medications take my medication as prescribed   Monitoring  test my blood glucose as discussed;send in my blood glucose log as discussed;test blood glucose pre and post meals as discussed   Reducing Risk examine blood glucose  patterns;stop smoking;do foot checks daily;treat hypoglycemia with 15 grams of carbs if blood glucose less than 70mg /dL;increase portions of nuts and seeds     Post-Education Assessment   Patient understands the diabetes disease and treatment process. Needs Review   Patient understands incorporating nutritional management into lifestyle. Needs Review   Patient undertands incorporating physical activity into lifestyle. Needs Review     Outcomes   Expected Outcomes Demonstrated interest in learning. Expect positive outcomes   Future DMSE 4-6 wks   Program Status Completed      Individualized Plan for Diabetes Self-Management Training:   Learning Objective:  Patient will have a greater understanding of diabetes self-management. Patient education plan is to attend individual and/or group sessions per assessed needs and concerns.   Plan:   Patient Instructions  Goal 1. Follow Plate Method 2. Eat meals on time 3. Drink water 4. Increase fresh fruits and vegetables. 5. Record blood sugars in am and at night on sheet and bring meter and sheets to all your appointments. Try classes at [facility] in [place] to help reduce stess Keep walking daily.    Expected Outcomes:  Demonstrated interest in learning. Expect positive outcomes  Education material provided: Living Well with Diabetes, Food label handouts, A1C conversion sheet, Meal plan card, My Plate and Carbohydrate counting sheet  If problems or questions, patient to contact team via:  Phone and Email  Future DSME appointment: 4-6 wks

## 2016-10-10 NOTE — Patient Instructions (Signed)
Goal 1. Follow Plate Method 2. Eat meals on time 3. Drink water 4. Increase fresh fruits and vegetables. 5. Record blood sugars in am and at night on sheet and bring meter and sheets to all your appointments. Try classes at [facility] in [place] to help reduce stess Keep walking daily.

## 2016-10-25 ENCOUNTER — Other Ambulatory Visit: Payer: Self-pay | Admitting: Family Medicine

## 2016-11-05 ENCOUNTER — Other Ambulatory Visit: Payer: Self-pay | Admitting: Family Medicine

## 2016-11-11 ENCOUNTER — Ambulatory Visit: Payer: Medicare Other | Admitting: Neurology

## 2016-11-13 ENCOUNTER — Other Ambulatory Visit: Payer: Self-pay | Admitting: Family Medicine

## 2016-12-09 ENCOUNTER — Other Ambulatory Visit: Payer: Medicare Other

## 2016-12-10 LAB — TSH: TSH: 2.32 u[IU]/mL (ref 0.450–4.500)

## 2016-12-10 LAB — CMP14+EGFR
A/G RATIO: 1.7 (ref 1.2–2.2)
ALT: 27 IU/L (ref 0–32)
AST: 25 IU/L (ref 0–40)
Albumin: 4.2 g/dL (ref 3.5–4.8)
Alkaline Phosphatase: 57 IU/L (ref 39–117)
BUN / CREAT RATIO: 27 (ref 12–28)
BUN: 12 mg/dL (ref 8–27)
Bilirubin Total: 0.4 mg/dL (ref 0.0–1.2)
CALCIUM: 9.4 mg/dL (ref 8.7–10.3)
CO2: 25 mmol/L (ref 18–29)
Chloride: 95 mmol/L — ABNORMAL LOW (ref 96–106)
Creatinine, Ser: 0.45 mg/dL — ABNORMAL LOW (ref 0.57–1.00)
GFR calc non Af Amer: 98 mL/min/{1.73_m2} (ref >59)
GFR, EST AFRICAN AMERICAN: 113 mL/min/{1.73_m2} (ref >59)
GLOBULIN, TOTAL: 2.5 g/dL (ref 1.5–4.5)
Glucose: 176 mg/dL — ABNORMAL HIGH (ref 65–99)
Potassium: 4.3 mmol/L (ref 3.5–5.2)
SODIUM: 138 mmol/L (ref 134–144)
TOTAL PROTEIN: 6.7 g/dL (ref 6.0–8.5)

## 2016-12-10 LAB — HEMOGLOBIN A1C
Est. average glucose Bld gHb Est-mCnc: 183 mg/dL
HEMOGLOBIN A1C: 8 % — AB (ref 4.8–5.6)

## 2016-12-10 LAB — T4, FREE: Free T4: 1.03 ng/dL (ref 0.82–1.77)

## 2016-12-16 ENCOUNTER — Encounter: Payer: Self-pay | Admitting: "Endocrinology

## 2016-12-16 ENCOUNTER — Ambulatory Visit (INDEPENDENT_AMBULATORY_CARE_PROVIDER_SITE_OTHER): Payer: Medicare Other | Admitting: "Endocrinology

## 2016-12-16 ENCOUNTER — Encounter: Payer: Medicare Other | Attending: Family Medicine | Admitting: Nutrition

## 2016-12-16 VITALS — BP 145/70 | HR 64 | Ht 63.0 in | Wt 171.0 lb

## 2016-12-16 VITALS — Wt 171.0 lb

## 2016-12-16 DIAGNOSIS — Z713 Dietary counseling and surveillance: Secondary | ICD-10-CM | POA: Insufficient documentation

## 2016-12-16 DIAGNOSIS — E1165 Type 2 diabetes mellitus with hyperglycemia: Secondary | ICD-10-CM

## 2016-12-16 DIAGNOSIS — E782 Mixed hyperlipidemia: Secondary | ICD-10-CM

## 2016-12-16 DIAGNOSIS — E119 Type 2 diabetes mellitus without complications: Secondary | ICD-10-CM | POA: Insufficient documentation

## 2016-12-16 DIAGNOSIS — IMO0002 Reserved for concepts with insufficient information to code with codable children: Secondary | ICD-10-CM

## 2016-12-16 DIAGNOSIS — I10 Essential (primary) hypertension: Secondary | ICD-10-CM

## 2016-12-16 DIAGNOSIS — E118 Type 2 diabetes mellitus with unspecified complications: Secondary | ICD-10-CM

## 2016-12-16 DIAGNOSIS — E1140 Type 2 diabetes mellitus with diabetic neuropathy, unspecified: Secondary | ICD-10-CM | POA: Diagnosis not present

## 2016-12-16 MED ORDER — METFORMIN HCL 1000 MG PO TABS: 1000.0000 mg | ORAL_TABLET | Freq: Two times a day (BID) | ORAL | 3 refills | Status: DC

## 2016-12-16 NOTE — Progress Notes (Signed)
Subjective:    Patient ID: Kristi Willis, female    DOB: [birth year]. Patient is being seen in f/u  for management of diabetes requested by  Worthy Rancher, MD  Past Medical History:  Diagnosis Date  . Allergy   . Asthma   . Cancer ([place])    skin  . Diabetes mellitus   . GERD (gastroesophageal reflux disease)    hiatal hernia  . Hypercholesteremia   . Hypertension   . Neuromuscular disorder (HCC)    DM neuropathy  . Neuropathy   . SVT (supraventricular tachycardia) (HCC)    Past Surgical History:  Procedure Laterality Date  . BREAST BIOPSY     left breast biopsy  . LUMBAR LAMINECTOMY  08/10  . SKIN CANCER EXCISION  2005  . SPINE SURGERY     lumbar laminectomy   Social History   Social History  . Marital status: Widowed    Spouse name: N/A  . Number of children: N/A  . Years of education: N/A   Occupational History  . Retired from [employer] but still works part-time    Social History Main Topics  . Smoking status: Never Smoker  . Smokeless tobacco: Never Used  . Alcohol use No  . Drug use: No  . Sexual activity: No   Other Topics Concern  . None   Social History Narrative   Lives with husband, who is a patient of Dr Lovena Le. She is active around the house and exercises regularly.   Outpatient Encounter Prescriptions as of 12/16/2016  Medication Sig  . ACCU-CHEK AVIVA PLUS test strip CHECK BLOOD SUGAR UP TO 4 TIMES A DAY OR AS DIRECTED  . ACCU-CHEK SOFTCLIX LANCETS lancets CHECK BLOOD SUGAR UP TO 4 TIMES A DAY OR AS DIRECTED  . acyclovir ointment (ZOVIRAX) 5 % Apply 1 application topically every 3 (three) hours. Around mouth, Until symptoms clear  . albuterol (VENTOLIN HFA) 108 (90 BASE) MCG/ACT inhaler Inhale 2 puffs into the lungs every 6 (six) hours as needed for wheezing.  Marland Kitchen amLODipine (NORVASC) 5 MG tablet TAKE 1 TABLET ONCE DAILY  . aspirin 81 MG chewable tablet Chew 81 mg by mouth daily.  Marland Kitchen azelastine (ASTELIN) 0.1 % nasal spray Place 2  sprays into both nostrils 2 (two) times daily.  . Blood Glucose Monitoring Suppl (ACCU-CHEK AVIVA PLUS) w/Device KIT CHECK BLOOD SUGAR UP TO 4 TIMES A DAY  . Calcium Carbonate-Vitamin D (CALTRATE 600+D) 600-400 MG-UNIT per tablet Take 1 tablet by mouth 2 (two) times daily.  . Cholecalciferol (VITAMIN D3) 1000 UNITS CAPS Take by mouth daily.    Marland Kitchen doxazosin (CARDURA) 8 MG tablet Take 1 tablet (8 mg total) by mouth daily. For blood pressure  . DULoxetine (CYMBALTA) 60 MG capsule Take 1 capsule (60 mg total) by mouth daily.  Marland Kitchen EPIPEN 2-PAK 0.3 MG/0.3ML SOAJ injection Reported on 01/24/2016  . lisinopril (PRINIVIL,ZESTRIL) 40 MG tablet TAKE 1 TABLET DAILY  . loratadine (CLARITIN) 10 MG tablet Take 1 tablet (10 mg total) by mouth daily.  . metFORMIN (GLUCOPHAGE) 1000 MG tablet Take 1 tablet (1,000 mg total) by mouth 2 (two) times daily with a meal.  . metoprolol succinate (TOPROL-XL) 100 MG 24 hr tablet TAKE  (1)  TABLET TWICE A DAY.  . montelukast (SINGULAIR) 10 MG tablet Take 1 tablet (10 mg total) by mouth at bedtime.  . Multiple Vitamin (MULTIVITAMIN) tablet Take 1 tablet by mouth daily.    . Omega-3 Fatty Acids (FISH OIL)  1200 MG CAPS Take by mouth.  . Pyridoxine HCl (VITAMIN B-6 PO) Take 1 tablet by mouth 2 (two) times daily. Reported on 01/24/2016  . rosuvastatin (CRESTOR) 10 MG tablet TAKE 1 TABLET DAILY  . sitaGLIPtin (JANUVIA) 25 MG tablet Take 1 tablet (25 mg total) by mouth daily.  . vitamin B-12 (CYANOCOBALAMIN) 500 MCG tablet Take 500 mcg by mouth daily.    . Vitamin D, Ergocalciferol, (DRISDOL) 50000 units CAPS capsule TAKE 1 CAPSULE ONCE A WEEK  . [DISCONTINUED] metFORMIN (GLUCOPHAGE) 1000 MG tablet Take 1,000 mg by mouth 2 (two) times daily with a meal.   No facility-administered encounter medications on file as of 12/16/2016.    ALLERGIES: Allergies  Allergen Reactions  . Dimetapp C [Phenylephrine-Bromphen-Codeine] Itching  . Phenylephrine Hcl Other (See Comments)    unknown  .  Ultram [Tramadol Hcl] Other (See Comments)    Insomnia  . Celebrex [Celecoxib] Rash  . Cortisone Rash  . Fenofibrate Rash  . Sulfa Antibiotics Rash   VACCINATION STATUS: Immunization History  Administered Date(s) Administered  . Influenza, High Dose Seasonal PF 06/13/2016  . Influenza,inj,Quad PF,36+ Mos 05/20/2013, 05/31/2014, 07/18/2015  . Influenza-Unspecified 08/20/2011  . Pneumococcal Conjugate-13 05/31/2014  . Pneumococcal Polysaccharide-23 07/22/2012  . Tdap 02/02/2015    Diabetes  She presents for her follow-up diabetic visit. She has type 2 diabetes mellitus. Onset time: She was diagnosed at approximate age of 76 years. Her disease course has been improving. There are no hypoglycemic associated symptoms. Pertinent negatives for hypoglycemia include no confusion, headaches, pallor or seizures. Associated symptoms include fatigue, foot paresthesias, polydipsia and polyuria. Pertinent negatives for diabetes include no chest pain and no polyphagia. There are no hypoglycemic complications. Symptoms are improving. Diabetic complications include peripheral neuropathy. Risk factors for coronary artery disease include diabetes mellitus, dyslipidemia, hypertension and sedentary lifestyle. Current diabetic treatments: Metformin 1000 g by mouth twice a day. Her weight is decreasing steadily. She is following a generally unhealthy diet. When asked about meal planning, she reported none. She has not had a previous visit with a dietitian. She rarely participates in exercise. Her breakfast blood glucose range is generally 140-180 mg/dl. Her lunch blood glucose range is generally 140-180 mg/dl. Her dinner blood glucose range is generally 140-180 mg/dl. Her overall blood glucose range is 140-180 mg/dl. An ACE inhibitor/angiotensin II receptor blocker is being taken. She sees a podiatrist.Eye exam is current.  Hyperlipidemia  This is a chronic problem. The current episode started more than 1 year ago. The  problem is uncontrolled. Recent lipid tests were reviewed and are high. Exacerbating diseases include diabetes. Pertinent negatives include no chest pain, myalgias or shortness of breath. Current antihyperlipidemic treatment includes statins. Risk factors for coronary artery disease include dyslipidemia, diabetes mellitus, hypertension and a sedentary lifestyle.  Hypertension  This is a chronic problem. The current episode started more than 1 year ago. The problem is controlled. Pertinent negatives include no chest pain, headaches, palpitations or shortness of breath. Risk factors for coronary artery disease include diabetes mellitus, dyslipidemia, sedentary lifestyle and family history. Past treatments include ACE inhibitors and beta blockers.       Review of Systems  Constitutional: Positive for fatigue. Negative for chills, fever and unexpected weight change.  HENT: Negative for trouble swallowing and voice change.   Eyes: Negative for visual disturbance.  Respiratory: Negative for cough, shortness of breath and wheezing.   Cardiovascular: Negative for chest pain, palpitations and leg swelling.  Gastrointestinal: Negative for diarrhea, nausea and vomiting.  Endocrine: Positive for polydipsia and polyuria. Negative for cold intolerance, heat intolerance and polyphagia.  Musculoskeletal: Negative for arthralgias and myalgias.  Skin: Negative for color change, pallor, rash and wound.  Neurological: Negative for seizures and headaches.  Psychiatric/Behavioral: Negative for confusion and suicidal ideas.    Objective:    BP (!) 145/70   Pulse 64   Ht '5\' 3"'  (1.6 m)   Wt 171 lb (77.6 kg)   BMI 30.29 kg/m   Wt Readings from Last 3 Encounters:  12/16/16 171 lb (77.6 kg)  10/10/16 177 lb (80.3 kg)  10/07/16 176 lb (79.8 kg)    Physical Exam  Constitutional: She is oriented to person, place, and time. She appears well-developed.  HENT:  Head: Normocephalic and atraumatic.  Eyes: EOM are  normal.  Neck: Normal range of motion. Neck supple. No tracheal deviation present. No thyromegaly present.  Cardiovascular: Normal rate and regular rhythm.   Pulmonary/Chest: Effort normal and breath sounds normal.  Abdominal: Soft. Bowel sounds are normal. There is no tenderness. There is no guarding.  Musculoskeletal: Normal range of motion. She exhibits no edema.  Neurological: She is alert and oriented to person, place, and time. She has normal reflexes. No cranial nerve deficit. Coordination normal.  Skin: Skin is warm and dry. No rash noted. No erythema. No pallor.  Psychiatric: She has a normal mood and affect. Judgment normal.    CMP     Component Value Date/Time   NA 138 12/09/2016 1018   K 4.3 12/09/2016 1018   CL 95 (L) 12/09/2016 1018   CO2 25 12/09/2016 1018   GLUCOSE 176 (H) 12/09/2016 1018   GLUCOSE 187 (H) 02/04/2016 1934   BUN 12 12/09/2016 1018   CREATININE 0.45 (L) 12/09/2016 1018   CREATININE 0.54 03/02/2013 0931   CALCIUM 9.4 12/09/2016 1018   PROT 6.7 12/09/2016 1018   ALBUMIN 4.2 12/09/2016 1018   AST 25 12/09/2016 1018   ALT 27 12/09/2016 1018   ALKPHOS 57 12/09/2016 1018   BILITOT 0.4 12/09/2016 1018   GFRNONAA 98 12/09/2016 1018   GFRNONAA >89 03/02/2013 0931   GFRAA 113 12/09/2016 1018   GFRAA >89 03/02/2013 0931     Diabetic Labs (most recent): Lab Results  Component Value Date   HGBA1C 8.0 (H) 12/09/2016   HGBA1C 9.3 08/22/2016   HGBA1C 8.9 10/13/2015     Lipid Panel ( most recent) Lipid Panel     Component Value Date/Time   CHOL 206 (H) 08/22/2016 1613   CHOL 177 02/23/2013 0911   TRIG 113 08/22/2016 1613   TRIG 203 (H) 01/12/2015 0915   TRIG 293 (H) 02/23/2013 0911   HDL 76 08/22/2016 1613   HDL 40 01/12/2015 0915   HDL 48 02/23/2013 0911   CHOLHDL 2.7 08/22/2016 1613   LDLCALC 107 (H) 08/22/2016 1613   LDLCALC 119 (H) 10/06/2013 1012   LDLCALC 70 02/23/2013 0911      Assessment & Plan:   1. Uncontrolled type 2 diabetes  mellitus with diabetic neuropathy, without long-term current use of insulin ([place])   - Patient has currently uncontrolled symptomatic type 2 DM since  76 years of age,  with most recent A1c 8%, improving from 9.3 %. Recent labs reviewed.   Her diabetes is complicated by referral neuropathy and patient remains at a high risk for more acute and chronic complications of diabetes which include CAD, CVA, CKD, retinopathy, and neuropathy. These are all discussed in detail with the patient.  - I  have counseled the patient on diet management and weight loss, by adopting a carbohydrate restricted/protein rich diet.  - Suggestion is made for patient to avoid simple carbohydrates   from their diet including Cakes , Desserts, Ice Cream,  Soda (  diet and regular) , Sweet Tea , Candies,  Chips, Cookies, Artificial Sweeteners,   and "Sugar-free" Products . This will help patient to have stable blood glucose profile and potentially avoid unintended weight gain.  - I encouraged the patient to switch to  unprocessed or minimally processed complex starch and increased protein intake (animal or plant source), fruits, and vegetables.  - Patient is advised to stick to a routine mealtimes to eat 3 meals  a day and avoid unnecessary snacks ( to snack only to correct hypoglycemia).  - The patient will be scheduled with Jearld Fenton, RDN, CDE for individualized DM education.  - I have approached patient with the following individualized plan to manage diabetes and patient agrees:   -  Based on her blood glucose readings, shewill not require insulin treatment at this time.  - She will continue to test blood glucose as needed. - I will continue metformin 1000 g by mouth twice a day, therapeutically suitable for patient. - I will continue  Januvia 25 mg by mouth every morning. Side effects and precautions discussed with her.  - Patient specific target  A1c;  LDL, HDL, Triglycerides, and  Waist Circumference were  discussed in detail.  2) BP/HTN: uncontrolled. Continue current medications including ACEI/ARB. 3) Lipids/HPL:   Uncontrolled.   Patient is advised tocontinue statins. 4)  Weight/Diet: CDE Consult has been initiated , exercise, and detailed carbohydrates information provided.  5) Chronic Care/Health Maintenance:  -Patient is on ACEI/ARB and Statin medications and encouraged to continue to follow up with Ophthalmology, Podiatrist at least yearly or according to recommendations, and advised to  stay away from smoking. I have recommended yearly flu vaccine and pneumonia vaccination at least every 5 years; moderate intensity exercise for up to 150 minutes weekly; and  sleep for at least 7 hours a day.  - 30 minutes of time was spent on the care of this patient , 50% of which was applied for counseling on diabetes complications and their preventions.  - Patient to bring meter and  blood glucose logs during her next visit.  - I advised patient to maintain close follow up with Worthy Rancher, MD for primary care needs.  Follow up plan: - Return in about 3 months (around 03/17/2017) for follow up with pre-visit labs.  Glade Lloyd, MD Phone: [phone]  Fax: [phone]   12/16/2016, 11:16 AM

## 2016-12-16 NOTE — Patient Instructions (Signed)

## 2016-12-16 NOTE — Patient Instructions (Addendum)
Goals 1. Eat three meals per day 2. Drink 3-4 bottles of water per day 3. Eat meals on time 4. Take meds as prescribed. Get A1C down to 6.5% or lower Prevent hypoglcyemia. Do not skip meals. Don't eat past 7 pm.

## 2016-12-16 NOTE — Progress Notes (Signed)
  Diabetes Self-Management Education  Visit Type:    Appt. Start Time:1000 Appt. End Time: 1100  12/16/2016  [name], identified by name and date of birth, is a 76 y.o. female with a diagnosis of Diabetes:  .A1C better at 8% down from 9.3%. She is eatng better balanced meals on regular schedule now. Trying not to eat late at night. ASSESSMENT  Weight 171 lb (77.6 kg). Body mass index is 30.29 kg/m.  Lab Results  Component Value Date   HGBA1C 8.0 (H) 12/09/2016   CMP Latest Ref Rng & Units 12/09/2016 05/20/2016 02/04/2016  Glucose 65 - 99 mg/dL 176(H) 202(H) 187(H)  BUN 8 - 27 mg/dL 12 13 13   Creatinine 0.57 - 1.00 mg/dL 0.45(L) 0.58 0.61  Sodium 134 - 144 mmol/L 138 139 135  Potassium 3.5 - 5.2 mmol/L 4.3 4.3 3.8  Chloride 96 - 106 mmol/L 95(L) 97 101  CO2 18 - 29 mmol/L 25 27 25   Calcium 8.7 - 10.3 mg/dL 9.4 9.2 9.2  Total Protein 6.0 - 8.5 g/dL 6.7 - -  Total Bilirubin 0.0 - 1.2 mg/dL 0.4 - -  Alkaline Phos 39 - 117 IU/L 57 - -  AST 0 - 40 IU/L 25 - -  ALT 0 - 32 IU/L 27 - -    Individualized Plan for Diabetes Self-Management Training:   Learning Objective:  Patient will have a greater understanding of diabetes self-management. Patient education plan is to attend individual and/or group sessions per assessed needs and concerns.   Plan:  Goals 1. Eat three meals per day 2. Drink 3-4 bottles of water per day 3. Eat meals on time 4. Take meds as prescribed. Get A1C down to 6.5% or lower Prevent hypoglcyemia. Do not skip meals. Don't eat past 7 pm.  Expected Outcomes:     Education material provided: Living Well with Diabetes, Food label handouts, A1C conversion sheet, Meal plan card, My Plate and Carbohydrate counting sheet  If problems or questions, patient to contact team via:  Phone and Email  Future DSME appointment:   3 month

## 2016-12-27 ENCOUNTER — Other Ambulatory Visit: Payer: Self-pay | Admitting: Family Medicine

## 2016-12-31 ENCOUNTER — Other Ambulatory Visit: Payer: Self-pay

## 2016-12-31 MED ORDER — METFORMIN HCL 1000 MG PO TABS: 1000.0000 mg | ORAL_TABLET | Freq: Two times a day (BID) | ORAL | 0 refills | Status: DC

## 2017-01-16 ENCOUNTER — Other Ambulatory Visit: Payer: Self-pay | Admitting: "Endocrinology

## 2017-01-16 ENCOUNTER — Other Ambulatory Visit: Payer: Self-pay | Admitting: Family Medicine

## 2017-01-22 LAB — HM DIABETES EYE EXAM

## 2017-02-11 ENCOUNTER — Other Ambulatory Visit: Payer: Self-pay | Admitting: Family Medicine

## 2017-03-10 ENCOUNTER — Other Ambulatory Visit: Payer: Medicare Other

## 2017-03-11 LAB — CMP14+EGFR
A/G RATIO: 1.7 (ref 1.2–2.2)
ALBUMIN: 4.3 g/dL (ref 3.5–4.8)
ALT: 30 IU/L (ref 0–32)
AST: 44 IU/L — ABNORMAL HIGH (ref 0–40)
Alkaline Phosphatase: 60 IU/L (ref 39–117)
BUN / CREAT RATIO: 20 (ref 12–28)
BUN: 10 mg/dL (ref 8–27)
Bilirubin Total: 0.3 mg/dL (ref 0.0–1.2)
CALCIUM: 9.1 mg/dL (ref 8.7–10.3)
CO2: 24 mmol/L (ref 20–29)
CREATININE: 0.49 mg/dL — AB (ref 0.57–1.00)
Chloride: 96 mmol/L (ref 96–106)
GFR, EST AFRICAN AMERICAN: 110 mL/min/{1.73_m2} (ref >59)
GFR, EST NON AFRICAN AMERICAN: 96 mL/min/{1.73_m2} (ref >59)
GLOBULIN, TOTAL: 2.5 g/dL (ref 1.5–4.5)
Glucose: 220 mg/dL — ABNORMAL HIGH (ref 65–99)
POTASSIUM: 4.5 mmol/L (ref 3.5–5.2)
SODIUM: 139 mmol/L (ref 134–144)
Total Protein: 6.8 g/dL (ref 6.0–8.5)

## 2017-03-11 LAB — HGB A1C W/O EAG: HEMOGLOBIN A1C: 7.9 % — AB (ref 4.8–5.6)

## 2017-03-17 ENCOUNTER — Ambulatory Visit (INDEPENDENT_AMBULATORY_CARE_PROVIDER_SITE_OTHER): Payer: Medicare Other | Admitting: "Endocrinology

## 2017-03-17 ENCOUNTER — Encounter: Payer: Self-pay | Admitting: "Endocrinology

## 2017-03-17 VITALS — BP 133/64 | HR 72 | Ht 63.0 in | Wt 172.0 lb

## 2017-03-17 DIAGNOSIS — E6609 Other obesity due to excess calories: Secondary | ICD-10-CM | POA: Diagnosis not present

## 2017-03-17 DIAGNOSIS — I10 Essential (primary) hypertension: Secondary | ICD-10-CM

## 2017-03-17 DIAGNOSIS — Z6830 Body mass index (BMI) 30.0-30.9, adult: Secondary | ICD-10-CM | POA: Diagnosis not present

## 2017-03-17 DIAGNOSIS — IMO0002 Reserved for concepts with insufficient information to code with codable children: Secondary | ICD-10-CM

## 2017-03-17 DIAGNOSIS — E1165 Type 2 diabetes mellitus with hyperglycemia: Secondary | ICD-10-CM

## 2017-03-17 DIAGNOSIS — E1140 Type 2 diabetes mellitus with diabetic neuropathy, unspecified: Secondary | ICD-10-CM | POA: Diagnosis not present

## 2017-03-17 DIAGNOSIS — E66811 Obesity, class 1: Secondary | ICD-10-CM

## 2017-03-17 DIAGNOSIS — E782 Mixed hyperlipidemia: Secondary | ICD-10-CM

## 2017-03-17 NOTE — Progress Notes (Signed)
Subjective:    Patient ID: Kristi Willis, female    DOB: 1941-04-24. Patient is being seen in f/u  for management of diabetes requested by  Dettinger, Fransisca Kaufmann, MD  Past Medical History:  Diagnosis Date  . Allergy   . Asthma   . Cancer ([place])    skin  . Diabetes mellitus   . GERD (gastroesophageal reflux disease)    hiatal hernia  . Hypercholesteremia   . Hypertension   . Neuromuscular disorder (HCC)    DM neuropathy  . Neuropathy   . SVT (supraventricular tachycardia) (HCC)    Past Surgical History:  Procedure Laterality Date  . BREAST BIOPSY     left breast biopsy  . LUMBAR LAMINECTOMY  08/10  . SKIN CANCER EXCISION  2005  . SPINE SURGERY     lumbar laminectomy   Social History   Social History  . Marital status: Widowed    Spouse name: N/A  . Number of children: N/A  . Years of education: N/A   Occupational History  . Retired from [employer] but still works part-time    Social History Main Topics  . Smoking status: Never Smoker  . Smokeless tobacco: Never Used  . Alcohol use No  . Drug use: No  . Sexual activity: No   Other Topics Concern  . None   Social History Narrative   Lives with husband, who is a patient of Dr Lovena Le. She is active around the house and exercises regularly.   Outpatient Encounter Prescriptions as of 03/17/2017  Medication Sig  . ACCU-CHEK AVIVA PLUS test strip CHECK BLOOD SUGAR UP TO 4 TIMES A DAY OR AS DIRECTED  . ACCU-CHEK SOFTCLIX LANCETS lancets CHECK BLOOD SUGAR UP TO 4 TIMES A DAY OR AS DIRECTED  . acyclovir ointment (ZOVIRAX) 5 % Apply 1 application topically every 3 (three) hours. Around mouth, Until symptoms clear  . albuterol (VENTOLIN HFA) 108 (90 BASE) MCG/ACT inhaler Inhale 2 puffs into the lungs every 6 (six) hours as needed for wheezing.  Marland Kitchen amLODipine (NORVASC) 5 MG tablet TAKE 1 TABLET ONCE DAILY  . aspirin 81 MG chewable tablet Chew 81 mg by mouth daily.  Marland Kitchen azelastine (ASTELIN) 0.1 % nasal spray Place 2  sprays into both nostrils 2 (two) times daily.  . Blood Glucose Monitoring Suppl (ACCU-CHEK AVIVA PLUS) w/Device KIT CHECK BLOOD SUGAR UP TO 4 TIMES A DAY  . Calcium Carbonate-Vitamin D (CALTRATE 600+D) 600-400 MG-UNIT per tablet Take 1 tablet by mouth 2 (two) times daily.  Marland Kitchen doxazosin (CARDURA) 8 MG tablet TAKE (1) TABLET DAILY FOR HIGH BLOOD PRESSURE.  . DULoxetine (CYMBALTA) 60 MG capsule Take 1 capsule (60 mg total) by mouth daily.  Marland Kitchen EPIPEN 2-PAK 0.3 MG/0.3ML SOAJ injection Reported on 01/24/2016  . lisinopril (PRINIVIL,ZESTRIL) 40 MG tablet TAKE 1 TABLET DAILY  . loratadine (CLARITIN) 10 MG tablet Take 1 tablet (10 mg total) by mouth daily.  . metFORMIN (GLUCOPHAGE) 1000 MG tablet Take 1 tablet (1,000 mg total) by mouth 2 (two) times daily with a meal.  . metoprolol succinate (TOPROL-XL) 100 MG 24 hr tablet TAKE  (1)  TABLET TWICE A DAY.  . montelukast (SINGULAIR) 10 MG tablet Take 1 tablet (10 mg total) by mouth at bedtime.  . Multiple Vitamin (MULTIVITAMIN) tablet Take 1 tablet by mouth daily.    . Omega-3 Fatty Acids (FISH OIL) 1200 MG CAPS Take by mouth.  . Pyridoxine HCl (VITAMIN B-6 PO) Take 1 tablet by mouth  2 (two) times daily. Reported on 01/24/2016  . rosuvastatin (CRESTOR) 10 MG tablet TAKE 1 TABLET DAILY  . sitaGLIPtin (JANUVIA) 25 MG tablet Take 1 tablet (25 mg total) by mouth daily.  . vitamin B-12 (CYANOCOBALAMIN) 500 MCG tablet Take 500 mcg by mouth daily.    . Vitamin D, Ergocalciferol, (DRISDOL) 50000 units CAPS capsule TAKE 1 CAPSULE ONCE A WEEK  . [DISCONTINUED] Cholecalciferol (VITAMIN D3) 1000 UNITS CAPS Take by mouth daily.     No facility-administered encounter medications on file as of 03/17/2017.    ALLERGIES: Allergies  Allergen Reactions  . Dimetapp C [Phenylephrine-Bromphen-Codeine] Itching  . Phenylephrine Hcl Other (See Comments)    unknown  . Ultram [Tramadol Hcl] Other (See Comments)    Insomnia  . Celebrex [Celecoxib] Rash  . Cortisone Rash  .  Fenofibrate Rash  . Sulfa Antibiotics Rash   VACCINATION STATUS: Immunization History  Administered Date(s) Administered  . Influenza, High Dose Seasonal PF 06/13/2016  . Influenza,inj,Quad PF,36+ Mos 05/20/2013, 05/31/2014, 07/18/2015  . Influenza-Unspecified 08/20/2011  . Pneumococcal Conjugate-13 05/31/2014  . Pneumococcal Polysaccharide-23 07/22/2012  . Tdap 02/02/2015    Diabetes  She presents for her follow-up diabetic visit. She has type 2 diabetes mellitus. Onset time: She was diagnosed at approximate age of 76 years. Her disease course has been stable. There are no hypoglycemic associated symptoms. Pertinent negatives for hypoglycemia include no confusion, headaches, pallor or seizures. Associated symptoms include fatigue and foot paresthesias. Pertinent negatives for diabetes include no chest pain, no polydipsia, no polyphagia and no polyuria. There are no hypoglycemic complications. Symptoms are stable. Diabetic complications include peripheral neuropathy. Risk factors for coronary artery disease include diabetes mellitus, dyslipidemia, hypertension and sedentary lifestyle. Current diabetic treatments: Metformin 1000 g by mouth twice a day. Her weight is stable. She is following a generally unhealthy diet. When asked about meal planning, she reported none. She has not had a previous visit with a dietitian. She rarely participates in exercise. An ACE inhibitor/angiotensin II receptor blocker is being taken. She sees a podiatrist.Eye exam is current.  Hyperlipidemia  This is a chronic problem. The current episode started more than 1 year ago. The problem is uncontrolled. Recent lipid tests were reviewed and are high. Exacerbating diseases include diabetes. Pertinent negatives include no chest pain, myalgias or shortness of breath. Current antihyperlipidemic treatment includes statins. Risk factors for coronary artery disease include dyslipidemia, diabetes mellitus, hypertension and a  sedentary lifestyle.  Hypertension  This is a chronic problem. The current episode started more than 1 year ago. The problem is controlled. Pertinent negatives include no chest pain, headaches, palpitations or shortness of breath. Risk factors for coronary artery disease include diabetes mellitus, dyslipidemia, sedentary lifestyle and family history. Past treatments include ACE inhibitors and beta blockers.     Review of Systems  Constitutional: Positive for fatigue. Negative for chills, fever and unexpected weight change.  HENT: Negative for trouble swallowing and voice change.   Eyes: Negative for visual disturbance.  Respiratory: Negative for cough, shortness of breath and wheezing.   Cardiovascular: Negative for chest pain, palpitations and leg swelling.  Gastrointestinal: Negative for diarrhea, nausea and vomiting.  Endocrine: Negative for cold intolerance, heat intolerance, polydipsia, polyphagia and polyuria.  Musculoskeletal: Negative for arthralgias and myalgias.  Skin: Negative for color change, pallor, rash and wound.  Neurological: Negative for seizures and headaches.  Psychiatric/Behavioral: Negative for confusion and suicidal ideas.    Objective:    BP 133/64 (BP Location: Left Arm, Patient Position: Sitting,  Cuff Size: Normal)   Pulse 72   Ht 5' 3" (1.6 m)   Wt 172 lb (78 kg)   SpO2 98%   BMI 30.47 kg/m   Wt Readings from Last 3 Encounters:  03/17/17 172 lb (78 kg)  12/16/16 171 lb (77.6 kg)  12/16/16 171 lb (77.6 kg)    Physical Exam  Constitutional: She is oriented to person, place, and time. She appears well-developed.  HENT:  Head: Normocephalic and atraumatic.  Eyes: EOM are normal.  Neck: Normal range of motion. Neck supple. No tracheal deviation present. No thyromegaly present.  Cardiovascular: Normal rate and regular rhythm.   Pulmonary/Chest: Effort normal and breath sounds normal.  Abdominal: Soft. Bowel sounds are normal. There is no tenderness.  There is no guarding.  Musculoskeletal: Normal range of motion. She exhibits no edema.  Neurological: She is alert and oriented to person, place, and time. She has normal reflexes. No cranial nerve deficit. Coordination normal.  Skin: Skin is warm and dry. No rash noted. No erythema. No pallor.  Psychiatric: She has a normal mood and affect. Judgment normal.    CMP     Component Value Date/Time   NA 139 03/10/2017 0000   K 4.5 03/10/2017 0000   CL 96 03/10/2017 0000   CO2 24 03/10/2017 0000   GLUCOSE 220 (H) 03/10/2017 0000   GLUCOSE 187 (H) 02/04/2016 1934   BUN 10 03/10/2017 0000   CREATININE 0.49 (L) 03/10/2017 0000   CREATININE 0.54 03/02/2013 0931   CALCIUM 9.1 03/10/2017 0000   PROT 6.8 03/10/2017 0000   ALBUMIN 4.3 03/10/2017 0000   AST 44 (H) 03/10/2017 0000   ALT 30 03/10/2017 0000   ALKPHOS 60 03/10/2017 0000   BILITOT 0.3 03/10/2017 0000   GFRNONAA 96 03/10/2017 0000   GFRNONAA >89 03/02/2013 0931   GFRAA 110 03/10/2017 0000   GFRAA >89 03/02/2013 0931     Diabetic Labs (most recent): Lab Results  Component Value Date   HGBA1C 7.9 (H) 03/10/2017   HGBA1C 8.0 (H) 12/09/2016   HGBA1C 9.3 08/22/2016     Lipid Panel ( most recent) Lipid Panel     Component Value Date/Time   CHOL 206 (H) 08/22/2016 1613   CHOL 177 02/23/2013 0911   TRIG 113 08/22/2016 1613   TRIG 203 (H) 01/12/2015 0915   TRIG 293 (H) 02/23/2013 0911   HDL 76 08/22/2016 1613   HDL 40 01/12/2015 0915   HDL 48 02/23/2013 0911   CHOLHDL 2.7 08/22/2016 1613   LDLCALC 107 (H) 08/22/2016 1613   LDLCALC 119 (H) 10/06/2013 1012   LDLCALC 70 02/23/2013 0911      Assessment & Plan:   1. Uncontrolled type 2 diabetes mellitus with diabetic neuropathy, without long-term current use of insulin ([place])   - Patient has currently uncontrolled symptomatic type 2 DM since  76 years of age,  with most recent A1c 7.9%,  Slowly improving from 9.3 %. Recent labs reviewed.   Her diabetes is complicated  by referral neuropathy and patient remains at a high risk for more acute and chronic complications of diabetes which include CAD, CVA, CKD, retinopathy, and neuropathy. These are all discussed in detail with the patient.  - I have counseled the patient on diet management and weight loss, by adopting a carbohydrate restricted/protein rich diet.  - Suggestion is made for patient to avoid simple carbohydrates   from her diet including Cakes , Desserts, Ice Cream,  Soda (  diet and regular) ,  Sweet Tea , Candies,  Chips, Cookies, Artificial Sweeteners,   and "Sugar-free" Products . This will help patient to have stable blood glucose profile and potentially avoid unintended weight gain.  - I encouraged the patient to switch to  unprocessed or minimally processed complex starch and increased protein intake (animal or plant source), fruits, and vegetables.  - Patient is advised to stick to a routine mealtimes to eat 3 meals  a day and avoid unnecessary snacks ( to snack only to correct hypoglycemia).   - I have approached patient with the following individualized plan to manage diabetes and patient agrees:   - I will continue metformin 1000 g by mouth twice a day, therapeutically suitable for patient. - I will continue  Januvia 25 mg by mouth every morning. Side effects and precautions discussed with her.  - Patient specific target  A1c;  LDL, HDL, Triglycerides, and  Waist Circumference were discussed in detail.  2) BP/HTN: uncontrolled. Continue current medications including ACEI/ARB. 3) Lipids/HPL:   Uncontrolled.   Patient is advised tocontinue statins. 4)  Weight/Diet: CDE Consult has been initiated , exercise, and detailed carbohydrates information provided.  5) Chronic Care/Health Maintenance:  -Patient is on ACEI/ARB and Statin medications and encouraged to continue to follow up with Ophthalmology, Podiatrist at least yearly or according to recommendations, and advised to  stay away from  smoking. I have recommended yearly flu vaccine and pneumonia vaccination at least every 5 years; moderate intensity exercise for up to 150 minutes weekly; and  sleep for at least 7 hours a day.  - 20 minutes of time was spent on the care of this patient , 50% of which was applied for counseling on diabetes complications and their preventions.  - Patient to bring meter and  blood glucose logs during her next visit.  - I advised patient to maintain close follow up with Dettinger, Fransisca Kaufmann, MD for primary care needs.  Follow up plan: - Return in about 4 months (around 07/18/2017) for follow up with pre-visit labs.  Glade Lloyd, MD Phone: [phone]  Fax: [phone]   03/17/2017, 2:01 PM

## 2017-04-05 ENCOUNTER — Other Ambulatory Visit: Payer: Self-pay | Admitting: Family Medicine

## 2017-04-08 ENCOUNTER — Other Ambulatory Visit: Payer: Self-pay | Admitting: Family Medicine

## 2017-04-10 ENCOUNTER — Other Ambulatory Visit: Payer: Self-pay | Admitting: "Endocrinology

## 2017-05-16 ENCOUNTER — Other Ambulatory Visit: Payer: Self-pay | Admitting: Family Medicine

## 2017-05-22 ENCOUNTER — Other Ambulatory Visit: Payer: Self-pay | Admitting: Family Medicine

## 2017-05-22 DIAGNOSIS — F32A Depression, unspecified: Secondary | ICD-10-CM

## 2017-05-22 DIAGNOSIS — F419 Anxiety disorder, unspecified: Principal | ICD-10-CM

## 2017-05-22 DIAGNOSIS — F329 Major depressive disorder, single episode, unspecified: Secondary | ICD-10-CM

## 2017-05-23 ENCOUNTER — Encounter: Payer: Self-pay | Admitting: Family

## 2017-05-23 ENCOUNTER — Ambulatory Visit (INDEPENDENT_AMBULATORY_CARE_PROVIDER_SITE_OTHER): Payer: Medicare Other | Admitting: Family

## 2017-05-23 VITALS — BP 153/64 | HR 68 | Temp 98.0°F | Ht 63.0 in | Wt 169.2 lb

## 2017-05-23 DIAGNOSIS — E1165 Type 2 diabetes mellitus with hyperglycemia: Secondary | ICD-10-CM

## 2017-05-23 DIAGNOSIS — E1140 Type 2 diabetes mellitus with diabetic neuropathy, unspecified: Secondary | ICD-10-CM | POA: Diagnosis not present

## 2017-05-23 DIAGNOSIS — F331 Major depressive disorder, recurrent, moderate: Secondary | ICD-10-CM | POA: Diagnosis not present

## 2017-05-23 DIAGNOSIS — M792 Neuralgia and neuritis, unspecified: Secondary | ICD-10-CM

## 2017-05-23 DIAGNOSIS — G5790 Unspecified mononeuropathy of unspecified lower limb: Secondary | ICD-10-CM

## 2017-05-23 DIAGNOSIS — IMO0002 Reserved for concepts with insufficient information to code with codable children: Secondary | ICD-10-CM

## 2017-05-23 MED ORDER — GABAPENTIN 300 MG PO CAPS: 300.0000 mg | ORAL_CAPSULE | Freq: Every day | ORAL | 3 refills | Status: DC

## 2017-05-23 MED ORDER — ESCITALOPRAM OXALATE 10 MG PO TABS: 10.0000 mg | ORAL_TABLET | Freq: Every day | ORAL | 3 refills | Status: DC

## 2017-05-23 MED ORDER — KETOCONAZOLE 2 % EX CREA: 1.0000 "application " | TOPICAL_CREAM | Freq: Every day | CUTANEOUS | 0 refills | Status: DC

## 2017-05-23 NOTE — Patient Instructions (Signed)
Diabetic Neuropathy Diabetic neuropathy is a nerve disease or nerve damage that is caused by diabetes mellitus. About half of all people with diabetes mellitus have some form of nerve damage. Nerve damage is more common in those who have had diabetes mellitus for many years and who generally have not had good control of their blood sugar (glucose) level. Diabetic neuropathy is a common complication of diabetes mellitus. There are three common types of diabetic neuropathy and a fourth type that is less common and less understood:  Peripheral neuropathy-This is the most common type of diabetic neuropathy. It causes damage to the nerves of the feet and legs first and then eventually the hands and arms. The damage affects the ability to sense touch.  Autonomic neuropathy-This type causes damage to the autonomic nervous system, which controls the following functions: ? Heartbeat. ? Body temperature. ? Blood pressure. ? Urination. ? Digestion. ? Sweating. ? Sexual function.  Focal neuropathy-Focal neuropathy can be painful and unpredictable and occurs most often in older adults with diabetes mellitus. It involves a specific nerve or one area and often comes on suddenly. It usually does not cause long-term problems.  Radiculoplexus neuropathy- Sometimes called lumbosacral radiculoplexus neuropathy, radiculoplexus neuropathy affects the nerves of the thighs, hips, buttocks, or legs. It is more common in people with type 2 diabetes mellitus and in older men. It is characterized by debilitating pain, weakness, and atrophy, usually in the thigh muscles.  What are the causes? The cause of peripheral, autonomic, and focal neuropathies is diabetes mellitus that is uncontrolled and high glucose levels. The cause of radiculoplexus neuropathy is unknown. However, it is thought to be caused by inflammation related to uncontrolled glucose levels. What are the signs or symptoms? Peripheral Neuropathy Peripheral  neuropathy develops slowly over time. When the nerves of the feet and legs no longer work there may be:  Burning, stabbing, or aching pain in the legs or feet.  Inability to feel pressure or pain in your feet. This can lead to: ? Thick calluses over pressure areas. ? Pressure sores. ? Ulcers.  Foot deformities.  Reduced ability to feel temperature changes.  Muscle weakness.  Autonomic Neuropathy The symptoms of autonomic neuropathy vary depending on which nerves are affected. Symptoms may include:  Problems with digestion, such as: ? Feeling sick to your stomach (nausea). ? Vomiting. ? Bloating. ? Constipation. ? Diarrhea. ? Abdominal pain.  Difficulty with urination. This occurs if you lose your ability to sense when your bladder is full. Problems include: ? Urine leakage (incontinence). ? Inability to empty your bladder completely (retention).  Rapid or irregular heartbeat (palpitations).  Blood pressure drops when you stand up (orthostatic hypotension). When you stand up you may feel: ? Dizzy. ? Weak. ? Faint.  In men, inability to attain and maintain an erection.  In women, vaginal dryness and problems with decreased sexual desire and arousal.  Problems with body temperature regulation.  Increased or decreased sweating.  Focal Neuropathy  Abnormal eye movements or abnormal alignment of both eyes.  Weakness in the wrist.  Foot drop. This results in an inability to lift the foot properly and abnormal walking or foot movement.  Paralysis on one side of your face (Bell palsy).  Chest or abdominal pain. Radiculoplexus Neuropathy  Sudden, severe pain in your hip, thigh, or buttocks.  Weakness and wasting of thigh muscles.  Difficulty rising from a seated position.  Abdominal swelling.  Unexplained weight loss (usually more than 10 lb [4.5 kg]). How is   this diagnosed? Peripheral Neuropathy Your senses may be tested. Sensory function testing can be  done with:  A light touch using a monofilament.  A vibration with tuning fork.  A sharp sensation with a pin prick.  Other tests that can help diagnose neuropathy are:  Nerve conduction velocity. This test checks the transmission of an electrical current through a nerve.  Electromyography. This shows how muscles respond to electrical signals transmitted by nearby nerves.  Quantitative sensory testing. This is used to assess how your nerves respond to vibrations and changes in temperature.  Autonomic Neuropathy Diagnosis is often based on reported symptoms. Tell your health care provider if you experience:  Dizziness.  Constipation.  Diarrhea.  Inappropriate urination or inability to urinate.  Inability to get or maintain an erection.  Tests that may be done include:  Electrocardiography or Holter monitor. These are tests that can help show problems with the heart rate or heart rhythm.  An X-ray exam may be done.  Focal Neuropathy Diagnosis is made based on your symptoms and what your health care provider finds during your exam. Other tests may be done. They may include:  Nerve conduction velocities. This checks the transmission of electrical current through a nerve.  Electromyography. This shows how muscles respond to electrical signals transmitted by nearby nerves.  Quantitative sensory testing. This test is used to assess how your nerves respond to vibration and changes in temperature.  Radiculoplexus Neuropathy  Often the first thing is to eliminate any other issue or problems that might be the cause, as there is no standard test for diagnosis.  X-ray exam of your spine and lumbar region.  Spinal tap to rule out cancer.  MRI to rule out other lesions. How is this treated? Once nerve damage occurs, it cannot be reversed. The goal of treatment is to keep the disease or nerve damage from getting worse and affecting more nerve fibers. Controlling your blood  glucose level is the key. Most people with radiculoplexus neuropathy see at least a partial improvement over time. You will need to keep your blood glucose and HbA1c levels in the target range determined by your health care provider. Things that help control blood glucose levels include:  Blood glucose monitoring.  Meal planning.  Physical activity.  Diabetes medicine.  Over time, maintaining lower blood glucose levels helps lessen symptoms. Sometimes, prescription pain medicine is needed. Follow these instructions at home:  Do not smoke.  Keep your blood glucose level in the range that you and your health care provider have determined acceptable for you.  Keep your blood pressure level in the range that you and your health care provider have determined acceptable for you.  Eat a well-balanced diet.  Be physically active every day. Include strength training and balance exercises.  Protect your feet. ? Check your feet every day for sores, cuts, blisters, or signs of infection. ? Wear padded socks and supportive shoes. Use orthotic inserts, if necessary. ? Regularly check the insides of your shoes for worn spots. Make sure there are no rocks or other items inside your shoes before you put them on. Contact a health care provider if:  You have burning, stabbing, or aching pain in the legs or feet.  You are unable to feel pressure or pain in your feet.  You develop problems with digestion such as: ? Nausea. ? Vomiting. ? Bloating. ? Constipation. ? Diarrhea. ? Abdominal pain.  You have difficulty with urination, such as: ? Incontinence. ? Retention.    You have palpitations.  You develop orthostatic hypotension. When you stand up you may feel: ? Dizzy. ? Weak. ? Faint.  You cannot attain and maintain an erection (in men).  You have vaginal dryness and problems with decreased sexual desire and arousal (in women).  You have severe pain in your thighs, legs, or  buttocks.  You have unexplained weight loss. This information is not intended to replace advice given to you by your health care provider. Make sure you discuss any questions you have with your health care provider. Document Released: 10/14/2001 Document Revised: 01/11/2016 Document Reviewed: 01/14/2013 Elsevier Interactive Patient Education  2017 [employer]  

## 2017-05-23 NOTE — Progress Notes (Signed)
   Subjective:    Patient ID: Kristi Willis, female    DOB: [birth year], 76 y.o.   MRN: [id number]  Pt presents to the office today to discuss Cymbalta. Pt states while taking the cymbalta she can not sleep. She reports sleeping 2 hours and then waking up. Pt states can sleep when she does not take the medication.   Pt has GAD, Depression, and Diabetic neuropathy.  Depression         This is a chronic problem.  The current episode started more than 1 year ago.   The onset quality is gradual.   The problem occurs intermittently.  The problem has been waxing and waning since onset.  Associated symptoms include insomnia, decreased interest and sad.  Associated symptoms include no helplessness, no hopelessness and not irritable.  Compliance with treatment is variable.  Past medical history includes anxiety.   Anxiety  Presents for follow-up visit. Symptoms include depressed mood, excessive worry, insomnia and nervous/anxious behavior. Symptoms occur occasionally. The quality of sleep is poor.    Diabetic Neuropathy  PT complaining of intermittent of 8 out 10 in bilateral feet.   Review of Systems  Psychiatric/Behavioral: Positive for depression. The patient is nervous/anxious and has insomnia.   All other systems reviewed and are negative.      Objective:   Physical Exam  Constitutional: She is oriented to person, place, and time. She appears well-developed and well-nourished. She is not irritable. No distress.  HENT:  Head: Normocephalic.  Eyes: Pupils are equal, round, and reactive to light.  Neck: Normal range of motion. Neck supple. No thyromegaly present.  Cardiovascular: Normal rate, regular rhythm, normal heart sounds and intact distal pulses.   No murmur heard. Pulmonary/Chest: Effort normal and breath sounds normal. No respiratory distress. She has no wheezes.  Abdominal: Soft. Bowel sounds are normal. She exhibits no distension. There is no tenderness.  Musculoskeletal:  Normal range of motion. She exhibits no edema or tenderness.  Neurological: She is alert and oriented to person, place, and time.  Skin: Skin is warm and dry.  Psychiatric: She has a normal mood and affect. Her behavior is normal. Judgment and thought content normal.  Vitals reviewed.     BP (!) 153/64   Pulse 68   Temp 98 F (36.7 C) (Oral)   Ht 5\' 3"  (1.6 m)   Wt 169 lb 3.2 oz (76.7 kg)   BMI 29.97 kg/m      Assessment & Plan:  1. Uncontrolled type 2 diabetes mellitus with diabetic neuropathy, without long-term current use of insulin ([facility]) Pt started on gabapentin today. Discussed sedation precautions  - gabapentin (NEURONTIN) 300 MG capsule; Take 1 capsule (300 mg total) by mouth at bedtime.  Dispense: 90 capsule; Refill: 3  2. Foot neuralgia, unspecified laterality -Discussed importance of inspections of foot daily!! - gabapentin (NEURONTIN) 300 MG capsule; Take 1 capsule (300 mg total) by mouth at bedtime.  Dispense: 90 capsule; Refill: 3  3. Moderate episode of recurrent major depressive disorder ([place]) PT started on lexapro 10 mg  Stress management discussed RTO in 6 weeks  - escitalopram (LEXAPRO) 10 MG tablet; Take 1 tablet (10 mg total) by mouth daily.  Dispense: 90 tablet; Refill: [place], FNP

## 2017-06-06 ENCOUNTER — Other Ambulatory Visit: Payer: Self-pay | Admitting: Family

## 2017-06-17 ENCOUNTER — Encounter: Payer: Self-pay | Admitting: *Deleted

## 2017-07-03 ENCOUNTER — Ambulatory Visit: Payer: Medicare Other | Admitting: Family

## 2017-07-03 ENCOUNTER — Encounter: Payer: Self-pay | Admitting: Family

## 2017-07-03 VITALS — BP 132/63 | HR 68 | Temp 98.3°F | Ht 63.0 in | Wt 170.8 lb

## 2017-07-03 DIAGNOSIS — E1165 Type 2 diabetes mellitus with hyperglycemia: Secondary | ICD-10-CM

## 2017-07-03 DIAGNOSIS — Z23 Encounter for immunization: Secondary | ICD-10-CM

## 2017-07-03 DIAGNOSIS — Z1211 Encounter for screening for malignant neoplasm of colon: Secondary | ICD-10-CM

## 2017-07-03 DIAGNOSIS — F331 Major depressive disorder, recurrent, moderate: Secondary | ICD-10-CM

## 2017-07-03 DIAGNOSIS — E1140 Type 2 diabetes mellitus with diabetic neuropathy, unspecified: Secondary | ICD-10-CM

## 2017-07-03 DIAGNOSIS — G5790 Unspecified mononeuropathy of unspecified lower limb: Secondary | ICD-10-CM | POA: Diagnosis not present

## 2017-07-03 DIAGNOSIS — M792 Neuralgia and neuritis, unspecified: Secondary | ICD-10-CM

## 2017-07-03 DIAGNOSIS — F411 Generalized anxiety disorder: Secondary | ICD-10-CM

## 2017-07-03 DIAGNOSIS — IMO0002 Reserved for concepts with insufficient information to code with codable children: Secondary | ICD-10-CM

## 2017-07-03 LAB — BAYER DCA HB A1C WAIVED: HB A1C: 8.1 % — AB (ref <7.0)

## 2017-07-03 MED ORDER — GABAPENTIN 100 MG PO CAPS: 100.0000 mg | ORAL_CAPSULE | Freq: Three times a day (TID) | ORAL | 3 refills | Status: DC

## 2017-07-03 NOTE — Progress Notes (Signed)
   Subjective:    Patient ID: Kristi Willis, female    DOB: January 20, 1941, 76 y.o.   MRN: [id number]  Pt presents to the office today to recheck GAD and Diabetic Neuropathy. Pt was placed on Lexapro 10 mg daily and Gabapentin 300 mg daily.   PT states her anxiety seems to be improved  Pt states the neuropathy pain is improved, but seems to make her drowsy.  Anxiety  Presents for follow-up visit. Symptoms include excessive worry, nervous/anxious behavior and restlessness. Symptoms occur occasionally. The severity of symptoms is mild.        Review of Systems  Psychiatric/Behavioral: The patient is nervous/anxious.   All other systems reviewed and are negative.      Objective:   Physical Exam  Constitutional: She is oriented to person, place, and time. She appears well-developed and well-nourished. No distress.  HENT:  Head: Normocephalic and atraumatic.  Right Ear: External ear normal.  Left Ear: External ear normal.  Nose: Nose normal.  Mouth/Throat: Oropharynx is clear and moist.  Eyes: Pupils are equal, round, and reactive to light.  Neck: Normal range of motion. Neck supple. No thyromegaly present.  Cardiovascular: Normal rate, regular rhythm, normal heart sounds and intact distal pulses.  No murmur heard. Pulmonary/Chest: Effort normal and breath sounds normal. No respiratory distress. She has no wheezes.  Abdominal: Soft. Bowel sounds are normal. She exhibits no distension. There is no tenderness.  Musculoskeletal: Normal range of motion. She exhibits edema (2+ BLE). She exhibits no tenderness.  Neurological: She is alert and oriented to person, place, and time.  Skin: Skin is warm and dry.  Psychiatric: She has a normal mood and affect. Her behavior is normal. Judgment and thought content normal. Slurred: sluttering.  Vitals reviewed.   See Diabetic foot note  BP 132/63   Pulse 68   Temp 98.3 F (36.8 C) (Oral)   Ht 5\' 3"  (1.6 m)   Wt 170 lb 12.8 oz (77.5  kg)   BMI 30.26 kg/m      Assessment & Plan:  1. Foot neuralgia, unspecified laterality Will decrease gabapentin to 100 mg from 300 mg. PT was taking 300 mg once daily, but now can take 100 mg TID. Follow up wit Podiatry every 6 weeks for toenails - gabapentin (NEURONTIN) 100 MG capsule; Take 1 capsule (100 mg total) 3 (three) times daily by mouth.  Dispense: 90 capsule; Refill: 3  2. Uncontrolled type 2 diabetes mellitus with diabetic neuropathy, without long-term current use of insulin (HCC) - gabapentin (NEURONTIN) 100 MG capsule; Take 1 capsule (100 mg total) 3 (three) times daily by mouth.  Dispense: 90 capsule; Refill: 3  3. Moderate episode of recurrent major depressive disorder (HCC) Pt requesting Lexapro 10 mg  Stress management  Wants referral to [facility]  - Ambulatory referral to Psychiatry  4. GAD (generalized anxiety disorder) - Ambulatory referral to Psychiatry  5. Colon cancer screening - Fecal occult blood, imunochemical; Future   Continue all meds Labs pending Health Maintenance reviewed Diet and exercise encouraged RTO 4 months   Evelina Dun, FNP

## 2017-07-03 NOTE — Addendum Note (Signed)
Addended by: Shelbie Ammons on: 07/03/2017 03:44 PM   Modules accepted: Orders

## 2017-07-03 NOTE — Patient Instructions (Signed)
Diabetic Neuropathy Diabetic neuropathy is a nerve disease or nerve damage that is caused by diabetes mellitus. About half of all people with diabetes mellitus have some form of nerve damage. Nerve damage is more common in those who have had diabetes mellitus for many years and who generally have not had good control of their blood sugar (glucose) level. Diabetic neuropathy is a common complication of diabetes mellitus. There are three common types of diabetic neuropathy and a fourth type that is less common and less understood:  Peripheral neuropathy-This is the most common type of diabetic neuropathy. It causes damage to the nerves of the feet and legs first and then eventually the hands and arms. The damage affects the ability to sense touch.  Autonomic neuropathy-This type causes damage to the autonomic nervous system, which controls the following functions: ? Heartbeat. ? Body temperature. ? Blood pressure. ? Urination. ? Digestion. ? Sweating. ? Sexual function.  Focal neuropathy-Focal neuropathy can be painful and unpredictable and occurs most often in older adults with diabetes mellitus. It involves a specific nerve or one area and often comes on suddenly. It usually does not cause long-term problems.  Radiculoplexus neuropathy- Sometimes called lumbosacral radiculoplexus neuropathy, radiculoplexus neuropathy affects the nerves of the thighs, hips, buttocks, or legs. It is more common in people with type 2 diabetes mellitus and in older men. It is characterized by debilitating pain, weakness, and atrophy, usually in the thigh muscles.  What are the causes? The cause of peripheral, autonomic, and focal neuropathies is diabetes mellitus that is uncontrolled and high glucose levels. The cause of radiculoplexus neuropathy is unknown. However, it is thought to be caused by inflammation related to uncontrolled glucose levels. What are the signs or symptoms? Peripheral Neuropathy Peripheral  neuropathy develops slowly over time. When the nerves of the feet and legs no longer work there may be:  Burning, stabbing, or aching pain in the legs or feet.  Inability to feel pressure or pain in your feet. This can lead to: ? Thick calluses over pressure areas. ? Pressure sores. ? Ulcers.  Foot deformities.  Reduced ability to feel temperature changes.  Muscle weakness.  Autonomic Neuropathy The symptoms of autonomic neuropathy vary depending on which nerves are affected. Symptoms may include:  Problems with digestion, such as: ? Feeling sick to your stomach (nausea). ? Vomiting. ? Bloating. ? Constipation. ? Diarrhea. ? Abdominal pain.  Difficulty with urination. This occurs if you lose your ability to sense when your bladder is full. Problems include: ? Urine leakage (incontinence). ? Inability to empty your bladder completely (retention).  Rapid or irregular heartbeat (palpitations).  Blood pressure drops when you stand up (orthostatic hypotension). When you stand up you may feel: ? Dizzy. ? Weak. ? Faint.  In men, inability to attain and maintain an erection.  In women, vaginal dryness and problems with decreased sexual desire and arousal.  Problems with body temperature regulation.  Increased or decreased sweating.  Focal Neuropathy  Abnormal eye movements or abnormal alignment of both eyes.  Weakness in the wrist.  Foot drop. This results in an inability to lift the foot properly and abnormal walking or foot movement.  Paralysis on one side of your face (Bell palsy).  Chest or abdominal pain. Radiculoplexus Neuropathy  Sudden, severe pain in your hip, thigh, or buttocks.  Weakness and wasting of thigh muscles.  Difficulty rising from a seated position.  Abdominal swelling.  Unexplained weight loss (usually more than 10 lb [4.5 kg]). How is   this diagnosed? Peripheral Neuropathy Your senses may be tested. Sensory function testing can be  done with:  A light touch using a monofilament.  A vibration with tuning fork.  A sharp sensation with a pin prick.  Other tests that can help diagnose neuropathy are:  Nerve conduction velocity. This test checks the transmission of an electrical current through a nerve.  Electromyography. This shows how muscles respond to electrical signals transmitted by nearby nerves.  Quantitative sensory testing. This is used to assess how your nerves respond to vibrations and changes in temperature.  Autonomic Neuropathy Diagnosis is often based on reported symptoms. Tell your health care provider if you experience:  Dizziness.  Constipation.  Diarrhea.  Inappropriate urination or inability to urinate.  Inability to get or maintain an erection.  Tests that may be done include:  Electrocardiography or Holter monitor. These are tests that can help show problems with the heart rate or heart rhythm.  An X-ray exam may be done.  Focal Neuropathy Diagnosis is made based on your symptoms and what your health care provider finds during your exam. Other tests may be done. They may include:  Nerve conduction velocities. This checks the transmission of electrical current through a nerve.  Electromyography. This shows how muscles respond to electrical signals transmitted by nearby nerves.  Quantitative sensory testing. This test is used to assess how your nerves respond to vibration and changes in temperature.  Radiculoplexus Neuropathy  Often the first thing is to eliminate any other issue or problems that might be the cause, as there is no standard test for diagnosis.  X-ray exam of your spine and lumbar region.  Spinal tap to rule out cancer.  MRI to rule out other lesions. How is this treated? Once nerve damage occurs, it cannot be reversed. The goal of treatment is to keep the disease or nerve damage from getting worse and affecting more nerve fibers. Controlling your blood  glucose level is the key. Most people with radiculoplexus neuropathy see at least a partial improvement over time. You will need to keep your blood glucose and HbA1c levels in the target range determined by your health care provider. Things that help control blood glucose levels include:  Blood glucose monitoring.  Meal planning.  Physical activity.  Diabetes medicine.  Over time, maintaining lower blood glucose levels helps lessen symptoms. Sometimes, prescription pain medicine is needed. Follow these instructions at home:  Do not smoke.  Keep your blood glucose level in the range that you and your health care provider have determined acceptable for you.  Keep your blood pressure level in the range that you and your health care provider have determined acceptable for you.  Eat a well-balanced diet.  Be physically active every day. Include strength training and balance exercises.  Protect your feet. ? Check your feet every day for sores, cuts, blisters, or signs of infection. ? Wear padded socks and supportive shoes. Use orthotic inserts, if necessary. ? Regularly check the insides of your shoes for worn spots. Make sure there are no rocks or other items inside your shoes before you put them on. Contact a health care provider if:  You have burning, stabbing, or aching pain in the legs or feet.  You are unable to feel pressure or pain in your feet.  You develop problems with digestion such as: ? Nausea. ? Vomiting. ? Bloating. ? Constipation. ? Diarrhea. ? Abdominal pain.  You have difficulty with urination, such as: ? Incontinence. ? Retention.    You have palpitations.  You develop orthostatic hypotension. When you stand up you may feel: ? Dizzy. ? Weak. ? Faint.  You cannot attain and maintain an erection (in men).  You have vaginal dryness and problems with decreased sexual desire and arousal (in women).  You have severe pain in your thighs, legs, or  buttocks.  You have unexplained weight loss. This information is not intended to replace advice given to you by your health care provider. Make sure you discuss any questions you have with your health care provider. Document Released: 10/14/2001 Document Revised: 01/11/2016 Document Reviewed: 01/14/2013 Elsevier Interactive Patient Education  2017 [employer]  

## 2017-07-04 ENCOUNTER — Ambulatory Visit: Payer: Medicare Other | Admitting: Family

## 2017-07-04 ENCOUNTER — Other Ambulatory Visit: Payer: Self-pay | Admitting: Family Medicine

## 2017-07-04 LAB — CMP14+EGFR
A/G RATIO: 1.8 (ref 1.2–2.2)
ALBUMIN: 4.3 g/dL (ref 3.5–4.8)
ALT: 19 IU/L (ref 0–32)
AST: 26 IU/L (ref 0–40)
Alkaline Phosphatase: 59 IU/L (ref 39–117)
BILIRUBIN TOTAL: 0.2 mg/dL (ref 0.0–1.2)
BUN / CREAT RATIO: 15 (ref 12–28)
BUN: 8 mg/dL (ref 8–27)
CHLORIDE: 100 mmol/L (ref 96–106)
CO2: 24 mmol/L (ref 20–29)
Calcium: 9 mg/dL (ref 8.7–10.3)
Creatinine, Ser: 0.53 mg/dL — ABNORMAL LOW (ref 0.57–1.00)
GFR calc non Af Amer: 93 mL/min/{1.73_m2} (ref >59)
GFR, EST AFRICAN AMERICAN: 107 mL/min/{1.73_m2} (ref >59)
GLOBULIN, TOTAL: 2.4 g/dL (ref 1.5–4.5)
GLUCOSE: 192 mg/dL — AB (ref 65–99)
Potassium: 4.2 mmol/L (ref 3.5–5.2)
SODIUM: 139 mmol/L (ref 134–144)
TOTAL PROTEIN: 6.7 g/dL (ref 6.0–8.5)

## 2017-07-07 ENCOUNTER — Other Ambulatory Visit: Payer: Medicare Other

## 2017-07-07 DIAGNOSIS — Z1211 Encounter for screening for malignant neoplasm of colon: Secondary | ICD-10-CM

## 2017-07-08 ENCOUNTER — Other Ambulatory Visit: Payer: Self-pay | Admitting: Family Medicine

## 2017-07-09 LAB — FECAL OCCULT BLOOD, IMMUNOCHEMICAL: Fecal Occult Bld: NEGATIVE

## 2017-07-16 ENCOUNTER — Other Ambulatory Visit: Payer: Self-pay | Admitting: Family Medicine

## 2017-07-21 ENCOUNTER — Encounter: Payer: Self-pay | Admitting: "Endocrinology

## 2017-07-21 ENCOUNTER — Ambulatory Visit: Payer: Medicare Other | Admitting: "Endocrinology

## 2017-07-21 VITALS — BP 154/69 | HR 73 | Ht 63.0 in | Wt 169.0 lb

## 2017-07-21 DIAGNOSIS — E1165 Type 2 diabetes mellitus with hyperglycemia: Secondary | ICD-10-CM | POA: Diagnosis not present

## 2017-07-21 DIAGNOSIS — I10 Essential (primary) hypertension: Secondary | ICD-10-CM | POA: Diagnosis not present

## 2017-07-21 DIAGNOSIS — E782 Mixed hyperlipidemia: Secondary | ICD-10-CM | POA: Diagnosis not present

## 2017-07-21 DIAGNOSIS — E1140 Type 2 diabetes mellitus with diabetic neuropathy, unspecified: Secondary | ICD-10-CM

## 2017-07-21 DIAGNOSIS — IMO0002 Reserved for concepts with insufficient information to code with codable children: Secondary | ICD-10-CM

## 2017-07-21 MED ORDER — SITAGLIPTIN PHOSPHATE 50 MG PO TABS: 25.0000 mg | ORAL_TABLET | Freq: Every day | ORAL | 3 refills | Status: DC

## 2017-07-21 NOTE — Patient Instructions (Signed)

## 2017-07-21 NOTE — Progress Notes (Signed)
Subjective:    Patient ID: Kristi Willis, female    DOB: Feb 28, 1941. Patient is being seen in f/u  for management of diabetes requested by  Sharion Balloon, FNP  Past Medical History:  Diagnosis Date  . Allergy   . Asthma   . Cancer ([place])    skin  . Diabetes mellitus   . GERD (gastroesophageal reflux disease)    hiatal hernia  . Hypercholesteremia   . Hypertension   . Neuromuscular disorder (HCC)    DM neuropathy  . Neuropathy   . SVT (supraventricular tachycardia) (HCC)    Past Surgical History:  Procedure Laterality Date  . BREAST BIOPSY     left breast biopsy  . LUMBAR LAMINECTOMY  08/10  . SKIN CANCER EXCISION  2005  . SPINE SURGERY     lumbar laminectomy   Social History   Socioeconomic History  . Marital status: Widowed    Spouse name: Not on file  . Number of children: Not on file  . Years of education: Not on file  . Highest education level: Not on file  Social Needs  . Financial resource strain: Not on file  . Food insecurity - worry: Not on file  . Food insecurity - inability: Not on file  . Transportation needs - medical: Not on file  . Transportation needs - non-medical: Not on file  Occupational History  . Occupation: Retired from [employer] but still works part-time  Tobacco Use  . Smoking status: Never Smoker  . Smokeless tobacco: Never Used  Substance and Sexual Activity  . Alcohol use: No  . Drug use: No  . Sexual activity: No  Other Topics Concern  . Not on file  Social History Narrative   Lives with husband, who is a patient of Dr Lovena Le. She is active around the house and exercises regularly.   Outpatient Encounter Medications as of 07/21/2017  Medication Sig  . ACCU-CHEK AVIVA PLUS test strip CHECK BLOOD SUGAR UP TO 4 TIMES A DAY OR AS DIRECTED  . ACCU-CHEK SOFTCLIX LANCETS lancets CHECK BLOOD SUGAR UP TO 4 TIMES A DAY OR AS DIRECTED  . acyclovir ointment (ZOVIRAX) 5 % Apply 1 application topically every 3 (three) hours.  Around mouth, Until symptoms clear  . albuterol (VENTOLIN HFA) 108 (90 BASE) MCG/ACT inhaler Inhale 2 puffs into the lungs every 6 (six) hours as needed for wheezing.  Marland Kitchen amLODipine (NORVASC) 5 MG tablet TAKE 1 TABLET ONCE DAILY  . aspirin 81 MG chewable tablet Chew 81 mg by mouth daily.  Marland Kitchen azelastine (ASTELIN) 0.1 % nasal spray Place 2 sprays into both nostrils 2 (two) times daily.  . Blood Glucose Monitoring Suppl (ACCU-CHEK AVIVA PLUS) w/Device KIT CHECK BLOOD SUGAR UP TO 4 TIMES A DAY  . Calcium Carbonate-Vitamin D (CALTRATE 600+D) 600-400 MG-UNIT per tablet Take 1 tablet by mouth 2 (two) times daily.  Marland Kitchen doxazosin (CARDURA) 8 MG tablet TAKE (1) TABLET DAILY FOR HIGH BLOOD PRESSURE.  Marland Kitchen EPIPEN 2-PAK 0.3 MG/0.3ML SOAJ injection Reported on 01/24/2016  . escitalopram (LEXAPRO) 10 MG tablet Take 1 tablet (10 mg total) by mouth daily.  Marland Kitchen gabapentin (NEURONTIN) 100 MG capsule Take 1 capsule (100 mg total) 3 (three) times daily by mouth.  Marland Kitchen ketoconazole (NIZORAL) 2 % cream Apply 1 application topically daily.  Marland Kitchen lisinopril (PRINIVIL,ZESTRIL) 40 MG tablet TAKE 1 TABLET DAILY  . loratadine (CLARITIN) 10 MG tablet Take 1 tablet (10 mg total) by mouth daily.  . metFORMIN (  GLUCOPHAGE) 1000 MG tablet Take 1 tablet (1,000 mg total) by mouth 2 (two) times daily with a meal.  . metoprolol succinate (TOPROL-XL) 100 MG 24 hr tablet TAKE (1) TABLET TWICE A DAY.  . montelukast (SINGULAIR) 10 MG tablet Take 1 tablet (10 mg total) by mouth at bedtime.  . Multiple Vitamin (MULTIVITAMIN) tablet Take 1 tablet by mouth daily.    . Omega-3 Fatty Acids (FISH OIL) 1200 MG CAPS Take by mouth.  . Pyridoxine HCl (VITAMIN B-6 PO) Take 1 tablet by mouth 2 (two) times daily. Reported on 01/24/2016  . rosuvastatin (CRESTOR) 10 MG tablet TAKE 1 TABLET DAILY  . sitaGLIPtin (JANUVIA) 50 MG tablet Take 0.5 tablets (25 mg total) by mouth daily.  . vitamin B-12 (CYANOCOBALAMIN) 500 MCG tablet Take 500 mcg by mouth daily.    . Vitamin D,  Ergocalciferol, (DRISDOL) 50000 units CAPS capsule TAKE 1 CAPSULE ONCE A WEEK  . [DISCONTINUED] JANUVIA 25 MG tablet TAKE 1 TABLET DAILY   No facility-administered encounter medications on file as of 07/21/2017.    ALLERGIES: Allergies  Allergen Reactions  . Dimetapp C [Phenylephrine-Bromphen-Codeine] Itching  . Phenylephrine Hcl Other (See Comments)    unknown  . Ultram [Tramadol Hcl] Other (See Comments)    Insomnia  . Celebrex [Celecoxib] Rash  . Cortisone Rash  . Fenofibrate Rash  . Sulfa Antibiotics Rash   VACCINATION STATUS: Immunization History  Administered Date(s) Administered  . Influenza, High Dose Seasonal PF 06/13/2016, 07/03/2017  . Influenza,inj,Quad PF,6+ Mos 05/20/2013, 05/31/2014, 07/18/2015  . Influenza-Unspecified 08/20/2011  . Pneumococcal Conjugate-13 05/31/2014  . Pneumococcal Polysaccharide-23 07/22/2012  . Tdap 02/02/2015    Diabetes  She presents for her follow-up diabetic visit. She has type 2 diabetes mellitus. Onset time: She was diagnosed at approximate age of 44 years. Her disease course has been stable. There are no hypoglycemic associated symptoms. Pertinent negatives for hypoglycemia include no confusion, headaches, pallor or seizures. Associated symptoms include fatigue and foot paresthesias. Pertinent negatives for diabetes include no chest pain, no polydipsia, no polyphagia and no polyuria. There are no hypoglycemic complications. Symptoms are stable. Diabetic complications include peripheral neuropathy. Risk factors for coronary artery disease include diabetes mellitus, dyslipidemia, hypertension and sedentary lifestyle. Current diabetic treatments: Metformin 1000 g by mouth twice a day. Her weight is decreasing steadily. She is following a generally unhealthy diet. When asked about meal planning, she reported none. She has not had a previous visit with a dietitian. She rarely participates in exercise. An ACE inhibitor/angiotensin II receptor blocker  is being taken. She sees a podiatrist.Eye exam is current.  Hyperlipidemia  This is a chronic problem. The current episode started more than 1 year ago. The problem is uncontrolled. Recent lipid tests were reviewed and are high. Exacerbating diseases include diabetes. Pertinent negatives include no chest pain, myalgias or shortness of breath. Current antihyperlipidemic treatment includes statins. Risk factors for coronary artery disease include dyslipidemia, diabetes mellitus, hypertension and a sedentary lifestyle.  Hypertension  This is a chronic problem. The current episode started more than 1 year ago. The problem is controlled. Pertinent negatives include no chest pain, headaches, palpitations or shortness of breath. Risk factors for coronary artery disease include diabetes mellitus, dyslipidemia, sedentary lifestyle and family history. Past treatments include ACE inhibitors and beta blockers.     Review of Systems  Constitutional: Positive for fatigue. Negative for chills, fever and unexpected weight change.  HENT: Negative for trouble swallowing and voice change.   Eyes: Negative for visual disturbance.  Respiratory: Negative for cough, shortness of breath and wheezing.   Cardiovascular: Negative for chest pain, palpitations and leg swelling.  Gastrointestinal: Negative for diarrhea, nausea and vomiting.  Endocrine: Negative for cold intolerance, heat intolerance, polydipsia, polyphagia and polyuria.  Musculoskeletal: Negative for arthralgias and myalgias.  Skin: Negative for color change, pallor, rash and wound.  Neurological: Negative for seizures and headaches.  Psychiatric/Behavioral: Negative for confusion and suicidal ideas.    Objective:    BP (!) 154/69   Pulse 73   Ht '5\' 3"'  (1.6 m)   Wt 169 lb (76.7 kg)   BMI 29.94 kg/m   Wt Readings from Last 3 Encounters:  07/21/17 169 lb (76.7 kg)  07/03/17 170 lb 12.8 oz (77.5 kg)  05/23/17 169 lb 3.2 oz (76.7 kg)    Physical  Exam  Constitutional: She is oriented to person, place, and time. She appears well-developed.  HENT:  Head: Normocephalic and atraumatic.  Eyes: EOM are normal.  Neck: Normal range of motion. Neck supple. No tracheal deviation present. No thyromegaly present.  Cardiovascular: Normal rate and regular rhythm.  Pulmonary/Chest: Effort normal and breath sounds normal.  Abdominal: Soft. Bowel sounds are normal. There is no tenderness. There is no guarding.  Musculoskeletal: Normal range of motion. She exhibits no edema.  Neurological: She is alert and oriented to person, place, and time. She has normal reflexes. No cranial nerve deficit. Coordination normal.  Skin: Skin is warm and dry. No rash noted. No erythema. No pallor.  Psychiatric: She has a normal mood and affect. Judgment normal.    CMP     Component Value Date/Time   NA 139 07/03/2017 1546   K 4.2 07/03/2017 1546   CL 100 07/03/2017 1546   CO2 24 07/03/2017 1546   GLUCOSE 192 (H) 07/03/2017 1546   GLUCOSE 187 (H) 02/04/2016 1934   BUN 8 07/03/2017 1546   CREATININE 0.53 (L) 07/03/2017 1546   CREATININE 0.54 03/02/2013 0931   CALCIUM 9.0 07/03/2017 1546   PROT 6.7 07/03/2017 1546   ALBUMIN 4.3 07/03/2017 1546   AST 26 07/03/2017 1546   ALT 19 07/03/2017 1546   ALKPHOS 59 07/03/2017 1546   BILITOT 0.2 07/03/2017 1546   GFRNONAA 93 07/03/2017 1546   GFRNONAA >89 03/02/2013 0931   GFRAA 107 07/03/2017 1546   GFRAA >89 03/02/2013 0931     Diabetic Labs (most recent): Lab Results  Component Value Date   HGBA1C 7.9 (H) 03/10/2017   HGBA1C 8.0 (H) 12/09/2016   HGBA1C 9.3 08/22/2016     Lipid Panel ( most recent) Lipid Panel     Component Value Date/Time   CHOL 206 (H) 08/22/2016 1613   CHOL 177 02/23/2013 0911   TRIG 113 08/22/2016 1613   TRIG 203 (H) 01/12/2015 0915   TRIG 293 (H) 02/23/2013 0911   HDL 76 08/22/2016 1613   HDL 40 01/12/2015 0915   HDL 48 02/23/2013 0911   CHOLHDL 2.7 08/22/2016 1613    LDLCALC 107 (H) 08/22/2016 1613   LDLCALC 119 (H) 10/06/2013 1012   LDLCALC 70 02/23/2013 0911      Assessment & Plan:   1. Uncontrolled type 2 diabetes mellitus with diabetic neuropathy, without long-term current use of insulin ([place])   - Patient has currently uncontrolled symptomatic type 2 DM since  76 years of age,  with most recent A1c unchanged from her last visit at 8.1% ,  Slowly improving from 9.3 %. Recent labs reviewed.   Her diabetes is complicated  by referral neuropathy and patient remains at a high risk for more acute and chronic complications of diabetes which include CAD, CVA, CKD, retinopathy, and neuropathy. These are all discussed in detail with the patient.  - I have counseled the patient on diet management and weight loss, by adopting a carbohydrate restricted/protein rich diet.  -  Suggestion is made for her to avoid simple carbohydrates  from her diet including Cakes, Sweet Desserts / Pastries, Ice Cream, Soda (diet and regular), Sweet Tea, Candies, Chips, Cookies, Store Bought Juices, Alcohol in Excess of  1-2 drinks a day, Artificial Sweeteners, and "Sugar-free" Products. This will help patient to have stable blood glucose profile and potentially avoid unintended weight gain.   - I encouraged the patient to switch to  unprocessed or minimally processed complex starch and increased protein intake (animal or plant source), fruits, and vegetables.  - Patient is advised to stick to a routine mealtimes to eat 3 meals  a day and avoid unnecessary snacks ( to snack only to correct hypoglycemia).   - I have approached patient with the following individualized plan to manage diabetes and patient agrees:   - I will continue to maximize none insulin treatment for this patient. She came with reasonable control of diabetes. - I will continue metformin 1000 mg by mouth twice a day, therapeutically suitable for patient. - I will increase Januvia to 50 mg by mouth every morning  with breakfast.  Side effects and precautions discussed with her.  - Patient specific target  A1c;  LDL, HDL, Triglycerides, and  Waist Circumference were discussed in detail.  2) BP/HTN: uncontrolled.  continue current medications including ACEI/ARB. 3) Lipids/HPL:   Uncontrolled.   Patient is advised tocontinue statins. 4)  Weight/Diet: CDE Consult has been initiated , exercise, and detailed carbohydrates information provided.  5) Chronic Care/Health Maintenance:  -Patient is on ACEI/ARB and Statin medications and encouraged to continue to follow up with Ophthalmology, Podiatrist at least yearly or according to recommendations, and advised to  stay away from smoking. I have recommended yearly flu vaccine and pneumonia vaccination at least every 5 years; moderate intensity exercise for up to 150 minutes weekly; and  sleep for at least 7 hours a day.  - I advised patient to maintain close follow up with Sharion Balloon, FNP for primary care needs.  Follow up plan: - Return in about 3 months (around 10/19/2017) for follow up with pre-visit labs.  Glade Lloyd, MD Phone: [phone]  Fax: [phone]  -  This note was partially dictated with voice recognition software. Similar sounding words can be transcribed inadequately or may not  be corrected upon review.  07/21/2017, 1:57 PM

## 2017-07-22 ENCOUNTER — Other Ambulatory Visit: Payer: Self-pay | Admitting: "Endocrinology

## 2017-07-31 ENCOUNTER — Other Ambulatory Visit: Payer: Self-pay | Admitting: Family Medicine

## 2017-08-21 ENCOUNTER — Other Ambulatory Visit: Payer: Self-pay | Admitting: Family Medicine

## 2017-09-05 ENCOUNTER — Telehealth: Payer: Self-pay | Admitting: Neurology

## 2017-09-05 NOTE — Telephone Encounter (Signed)
Patient daughter wants to talk to someone about what is going on with her mother. Wondering if she could have parkinson with some of the stuff that is going on

## 2017-09-08 NOTE — Telephone Encounter (Signed)
Spoke with pt's daughter.  She states that pt has a slight tremor in her hand.  She also states that a family friend with the same exact symptoms was recently diagnosed with parkinson's, so now daughter believes that is what pt has as well.  I advised that without being seen in over a year, Dr. Delice Lesch would not be able to diagnose.  Daughter states that she was unaware that pt needed to follow up with Dr. Delice Lesch, since pt already goes to a therapist.  Appointment made for Jan 30 @ 3:30PM.

## 2017-09-17 ENCOUNTER — Telehealth: Payer: Self-pay | Admitting: Neurology

## 2017-09-17 ENCOUNTER — Encounter: Payer: Self-pay | Admitting: Neurology

## 2017-09-17 ENCOUNTER — Ambulatory Visit: Payer: Medicare Other | Admitting: Neurology

## 2017-09-17 VITALS — BP 100/60 | HR 64 | Ht 63.0 in | Wt 165.0 lb

## 2017-09-17 DIAGNOSIS — G3184 Mild cognitive impairment, so stated: Secondary | ICD-10-CM

## 2017-09-17 DIAGNOSIS — R4789 Other speech disturbances: Secondary | ICD-10-CM

## 2017-09-17 MED ORDER — ESCITALOPRAM OXALATE 20 MG PO TABS: 20.0000 mg | ORAL_TABLET | Freq: Every day | ORAL | 3 refills | Status: DC

## 2017-09-17 NOTE — Telephone Encounter (Signed)
Patient's daughter [name] called and would like to speak with you before her mom's appointment this afternoon. She is unable to come with her. Please Call. Thanks

## 2017-09-17 NOTE — Progress Notes (Signed)
NEUROLOGY FOLLOW UP OFFICE NOTE  Kristi Willis [id number] May 05, 1941  HISTORY OF PRESENT ILLNESS: I had the pleasure of seeing Kristi Willis in follow-up in the neurology clinic on 09/17/2017.  The patient was last seen more than a year ago for worsening memory. She is accompanied by her sister who helps supplement the history today.  Records and images were personally reviewed where available.  She underwent Neuropsychological testing in January 2018 with a diagnosis of Mild Cognitive Impairment, with executive dysfunction noted across multiple tasks. There was not a consistent pattern of memory/hippocampal consolidation dysfunction noted. Testing was not consistent with Alzheimer's disease. Speech difficulties were noted during testing, but there was no evidence of impairment in semantic retrieval or comprehension, hence primary progressive aphasia was not considered likely. There was note of significant grief-related depression and psychosocial stress likely contributing to cognitive difficulties.  She presents today for follow-up with continued trouble with speech. She "can't talk much," especially if she gets upset or stressed. She knows what she wants to say but can't get them out. She is noted to be able to speak sentences but has difficulties getting words out, pausing then saying "I don't know." Her daughter expressed concern that her symptoms were similar to a friend with Parkinsons disease and requested evaluation for this. She continues to live alone, denies getting lost driving, missed bill payments. She occasionally forgets her medications. She denies any headaches, dizziness, vision changes, dysphagia, focal numbness/tingling/weakness, anosmia, rigidity, tremors, no falls.   HPI 06/10/2016: This is a 77 yo RH woman with a history of hypertension, diabetes, neuropathy, who presented for evaluation of word-finding difficulties. She and her daughter started noticing symptoms for  the past couple of years, but worsened after her husband passed away last 05-03-23. She reports that she knows what she wants to say but it does not come out as it should. Her daughter noticed it was hard to have conversation with her. She started to want to be alone and stay at home due to this. She was started on an antidepressant, and reports that symptoms did improve a little, but she still struggles to get out the idea of what she wants to say. She has good and bad days. She lives alone and denies getting lost driving. Her daughter though reports she falls asleep driving so her sister is with her for long distance driving. She occasionally forgets her medications and occasionally misplaces things. They could not find their Christmas gifts and got them in July. No missed bill payments. No hygiene issues, no difficulties with ADLs. Her daughter feels that the confusion is a little better, but she is still not comfortable talking to other people. She had an MRI brain without contrast done 04/17/16 for these symptoms which I personally reviewed, no acute changes, there was mild diffuse volume loss.  She has neuropathy with burning and numbness in both feet. She has occasional back pain, no falls. She occasionally has problems swallowing meat. She denies any headaches, dizziness, diplopia, dysarthria, neck pain, focal numbness/tingling/weakness, bowel/bladder dysfunction, anosmia, or tremors. No family history of dementia, no history of significant head injuries. She occasionally drinks alcohol.  PAST MEDICAL HISTORY: Past Medical History:  Diagnosis Date  . Allergy   . Asthma   . Cancer ([place])    skin  . Diabetes mellitus   . GERD (gastroesophageal reflux disease)    hiatal hernia  . Hypercholesteremia   . Hypertension   . Neuromuscular disorder ([place])    DM  neuropathy  . Neuropathy   . SVT (supraventricular tachycardia) ([facility])     MEDICATIONS: Current Outpatient Medications on File Prior to  Visit  Medication Sig Dispense Refill  . ACCU-CHEK AVIVA PLUS test strip CHECK BLOOD SUGAR UP TO 4 TIMES A DAY OR AS DIRECTED 100 each 1  . ACCU-CHEK SOFTCLIX LANCETS lancets CHECK BLOOD SUGAR UP TO 4 TIMES A DAY OR AS DIRECTED 100 each 1  . acyclovir ointment (ZOVIRAX) 5 % Apply 1 application topically every 3 (three) hours. Around mouth, Until symptoms clear 15 g 5  . albuterol (VENTOLIN HFA) 108 (90 BASE) MCG/ACT inhaler Inhale 2 puffs into the lungs every 6 (six) hours as needed for wheezing. 1 Inhaler 3  . amLODipine (NORVASC) 5 MG tablet TAKE 1 TABLET ONCE DAILY 90 tablet 0  . aspirin 81 MG chewable tablet Chew 81 mg by mouth daily.    Marland Kitchen azelastine (ASTELIN) 0.1 % nasal spray Place 2 sprays into both nostrils 2 (two) times daily. 30 mL 11  . Blood Glucose Monitoring Suppl (ACCU-CHEK AVIVA PLUS) w/Device KIT CHECK BLOOD SUGAR UP TO 4 TIMES A DAY 1 kit 0  . Calcium Carbonate-Vitamin D (CALTRATE 600+D) 600-400 MG-UNIT per tablet Take 1 tablet by mouth 2 (two) times daily.    Marland Kitchen doxazosin (CARDURA) 8 MG tablet TAKE (1) TABLET DAILY FOR HIGH BLOOD PRESSURE. 90 tablet 0  . EPIPEN 2-PAK 0.3 MG/0.3ML SOAJ injection Reported on 01/24/2016    . escitalopram (LEXAPRO) 10 MG tablet Take 1 tablet (10 mg total) by mouth daily. 90 tablet 3  . gabapentin (NEURONTIN) 100 MG capsule Take 1 capsule (100 mg total) 3 (three) times daily by mouth. 90 capsule 3  . ketoconazole (NIZORAL) 2 % cream Apply 1 application topically daily. 15 g 0  . lisinopril (PRINIVIL,ZESTRIL) 40 MG tablet TAKE 1 TABLET DAILY 90 tablet 0  . lisinopril (PRINIVIL,ZESTRIL) 40 MG tablet TAKE 1 TABLET DAILY 90 tablet 0  . loratadine (CLARITIN) 10 MG tablet Take 1 tablet (10 mg total) by mouth daily. 30 tablet 11  . metFORMIN (GLUCOPHAGE) 1000 MG tablet Take 1 tablet (1,000 mg total) by mouth 2 (two) times daily with a meal. 60 tablet 2  . metoprolol succinate (TOPROL-XL) 100 MG 24 hr tablet TAKE (1) TABLET TWICE A DAY. 180 tablet 1  .  montelukast (SINGULAIR) 10 MG tablet Take 1 tablet (10 mg total) by mouth at bedtime. 30 tablet 5  . Multiple Vitamin (MULTIVITAMIN) tablet Take 1 tablet by mouth daily.      . Omega-3 Fatty Acids (FISH OIL) 1200 MG CAPS Take by mouth.    . Pyridoxine HCl (VITAMIN B-6 PO) Take 1 tablet by mouth 2 (two) times daily. Reported on 01/24/2016    . rosuvastatin (CRESTOR) 10 MG tablet TAKE 1 TABLET DAILY 90 tablet 0  . sitaGLIPtin (JANUVIA) 50 MG tablet Take 0.5 tablets (25 mg total) by mouth daily. 30 tablet 3  . vitamin B-12 (CYANOCOBALAMIN) 500 MCG tablet Take 500 mcg by mouth daily.      . Vitamin D, Ergocalciferol, (DRISDOL) 50000 units CAPS capsule TAKE 1 CAPSULE ONCE A WEEK 12 capsule 0   No current facility-administered medications on file prior to visit.     ALLERGIES: Allergies  Allergen Reactions  . Dimetapp C [Phenylephrine-Bromphen-Codeine] Itching  . Phenylephrine Hcl Other (See Comments)    unknown  . Ultram [Tramadol Hcl] Other (See Comments)    Insomnia  . Celebrex [Celecoxib] Rash  . Cortisone Rash  .  Fenofibrate Rash  . Sulfa Antibiotics Rash    FAMILY HISTORY: Family History  Problem Relation Age of Onset  . Cancer Mother 16       cervical cancer  . Depression Mother   . Heart disease Father   . Stroke Father   . Cancer Father        lung cancer  . Heart disease Sister 14       MI  . Hypertension Sister   . Cancer Sister 53       breast cancer  . Healthy Daughter     SOCIAL HISTORY: Social History   Socioeconomic History  . Marital status: Widowed    Spouse name: Not on file  . Number of children: Not on file  . Years of education: Not on file  . Highest education level: Not on file  Social Needs  . Financial resource strain: Not on file  . Food insecurity - worry: Not on file  . Food insecurity - inability: Not on file  . Transportation needs - medical: Not on file  . Transportation needs - non-medical: Not on file  Occupational History  .  Occupation: Retired from [employer] but still works part-time  Tobacco Use  . Smoking status: Never Smoker  . Smokeless tobacco: Never Used  Substance and Sexual Activity  . Alcohol use: No  . Drug use: No  . Sexual activity: No  Other Topics Concern  . Not on file  Social History Narrative   Lives with husband, who is a patient of Dr Lovena Le. She is active around the house and exercises regularly.    REVIEW OF SYSTEMS: Constitutional: No fevers, chills, or sweats, no generalized fatigue, change in appetite Eyes: No visual changes, double vision, eye pain Ear, nose and throat: No hearing loss, ear pain, nasal congestion, sore throat Cardiovascular: No chest pain, palpitations Respiratory:  No shortness of breath at rest or with exertion, wheezes GastrointestinaI: No nausea, vomiting, diarrhea, abdominal pain, fecal incontinence Genitourinary:  No dysuria, urinary retention or frequency Musculoskeletal:  No neck pain,+ back pain Integumentary: No rash, pruritus, skin lesions Neurological: as above Psychiatric: + depression, insomnia, anxiety Endocrine: No palpitations, fatigue, diaphoresis, mood swings, change in appetite, change in weight, increased thirst Hematologic/Lymphatic:  No anemia, purpura, petechiae. Allergic/Immunologic: no itchy/runny eyes, nasal congestion, recent allergic reactions, rashes  PHYSICAL EXAM: Vitals:   09/17/17 1539  BP: 100/60  Pulse: 64  SpO2: 95%   General: No acute distress Head:  Normocephalic/atraumatic Neck: supple, no paraspinal tenderness, full range of motion Heart:  Regular rate and rhythm Lungs:  Clear to auscultation bilaterally Back: No paraspinal tenderness Skin/Extremities: No rash, no edema Neurological Exam: alert and oriented to person, place, and time. No aphasia or dysarthria. Fund of knowledge is appropriate.  Recent and remote memory are intact.  Attention and concentration are normal.    Able to name objects, difficulty with  repetition. CDT 5/5 MMSE - Mini Mental State Exam 09/17/2017 06/05/2016 12/22/2014  Orientation to time _0 Orientation to Place _1 Registration _2 Attention/ Calculation _3 Recall _4 Language- name 2 objects _5 Language- repeat 0 0 0  Language- follow 3 step command _6 Language- read & follow direction _7 Write a sentence _8 Copy design _9 Total score _10 Cranial nerves: Pupils equal, round,  reactive to light.  Extraocular movements intact with no nystagmus. Visual fields full. Facial sensation intact. No facial asymmetry. Tongue, uvula, palate midline.  Motor: Bulk and tone normal, no cogwheeling, muscle strength 5/5 throughout with no pronator drift. Good fine finger movements, normal finger and toe taps. Sensation to light touch intact.  No extinction to double simultaneous stimulation.  Deep tendon reflexes +2 throughout, toes downgoing.  Finger to nose testing intact.  Gait narrow-based and steady, able to tandem walk adequately.  Romberg negative. No tremors noted.  IMPRESSION: This is a 77 yo RH woman with a history of  hypertension, diabetes, neuropathy, who presented in October 2017 with word-finding difficulties. She underwent Neuropsychological evaluation which showed mild cognitive impairment, likely vascular, mostly with executive dysfunction. Findings were not consistent with Alzheimer's disease, and despite note of speech difficulties, primary progressive aphasia was also not considered likely. She continues to present with similar word-finding difficulties, we discussed recommendations from her Neuropsych testing where she was noted to have impaired repetition of words and phrases on testing (noted again today, MMSE 29/30), referral to speech pathology will be done for further assessment of speech difficulty. We also discussed recommendations for control of vascular risk factors, treatment of grief-related depression, and stress  management. She will increase Lexapro to 8m daily. She will be scheduled for repeat Neuropsychological testing for 1 year follow-up. She was advised to proceed with counseling as planned. Follow-up after testing.   Thank you for allowing me to participate in her care.  Please do not hesitate to call for any questions or concerns.  The duration of this appointment visit was 25 minutes of face-to-face time with the patient.  Greater than 50% of this time was spent in counseling, explanation of diagnosis, planning of further management, and coordination of care.   KEllouise Newer M.D.   CC: CEvelina Dun FNP

## 2017-09-17 NOTE — Telephone Encounter (Signed)
Spoke with pt's daughter.  She will not be able to make it to today's appointment, however one of pt's sisters will be accompanying her. Daughter states that pt's sister (not same sister coming to appointment) has a friend/neighbor who was recently diagnosed with parkinson's and pt is experiencing some of the same symptoms as friend.neighbor - choppy speech, confusion, pt knows what she wants to say but cannot form the words.  Daughter asks that the word "parkinson's" not be used during today's visit unless Dr. Delice Lesch suspects it.  Daughter goes on to state that pt is still driving and seems to still have that under control.  However, due to her speech difficulties pt is now starting to withdrawal from people.    Daughter asked that I call her after appointment today to give her an update.

## 2017-09-17 NOTE — Telephone Encounter (Signed)
Noted  

## 2017-09-17 NOTE — Patient Instructions (Signed)
1. Increase Lexapro to 20mg  daily 2. Refer to Speech Therapy for word-finding difficulties 3. Schedule repeat Neurocognitive testing with Dr. Si Raider 4. Proceed with counseling 5. Follow-up after testing

## 2017-10-03 ENCOUNTER — Ambulatory Visit: Payer: Medicare Other | Admitting: Family

## 2017-10-03 ENCOUNTER — Encounter: Payer: Self-pay | Admitting: Family

## 2017-10-03 VITALS — BP 157/56 | HR 60 | Temp 99.2°F | Ht 63.0 in | Wt 165.0 lb

## 2017-10-03 DIAGNOSIS — E1169 Type 2 diabetes mellitus with other specified complication: Secondary | ICD-10-CM

## 2017-10-03 DIAGNOSIS — F331 Major depressive disorder, recurrent, moderate: Secondary | ICD-10-CM | POA: Diagnosis not present

## 2017-10-03 DIAGNOSIS — E1140 Type 2 diabetes mellitus with diabetic neuropathy, unspecified: Secondary | ICD-10-CM | POA: Diagnosis not present

## 2017-10-03 DIAGNOSIS — E559 Vitamin D deficiency, unspecified: Secondary | ICD-10-CM | POA: Diagnosis not present

## 2017-10-03 DIAGNOSIS — E1142 Type 2 diabetes mellitus with diabetic polyneuropathy: Secondary | ICD-10-CM

## 2017-10-03 DIAGNOSIS — E1165 Type 2 diabetes mellitus with hyperglycemia: Secondary | ICD-10-CM

## 2017-10-03 DIAGNOSIS — E785 Hyperlipidemia, unspecified: Secondary | ICD-10-CM | POA: Diagnosis not present

## 2017-10-03 DIAGNOSIS — J4520 Mild intermittent asthma, uncomplicated: Secondary | ICD-10-CM | POA: Diagnosis not present

## 2017-10-03 DIAGNOSIS — E1159 Type 2 diabetes mellitus with other circulatory complications: Secondary | ICD-10-CM | POA: Diagnosis not present

## 2017-10-03 DIAGNOSIS — F411 Generalized anxiety disorder: Secondary | ICD-10-CM

## 2017-10-03 DIAGNOSIS — I10 Essential (primary) hypertension: Secondary | ICD-10-CM | POA: Diagnosis not present

## 2017-10-03 DIAGNOSIS — I152 Hypertension secondary to endocrine disorders: Secondary | ICD-10-CM

## 2017-10-03 DIAGNOSIS — IMO0002 Reserved for concepts with insufficient information to code with codable children: Secondary | ICD-10-CM

## 2017-10-03 LAB — BAYER DCA HB A1C WAIVED: HB A1C: 7.1 % — AB (ref <7.0)

## 2017-10-03 LAB — HEMOGLOBIN A1C: Hemoglobin A1C: 7.1

## 2017-10-03 NOTE — Progress Notes (Signed)
Subjective:    Patient ID: Kristi Willis, female    DOB: 24-Aug-1940, 77 y.o.   MRN: [id number]  PT presents to the office today for chronic follow up. PT is followed by Neurologists annually for mild cognitive impairment and word finding difficulty. Pt is followed by Endocrinologists every 3 months for uncontrolled DM 2. PT's daughter states she would like all her labs drawn today.   Diabetes  Her disease course has been improving. There are no hypoglycemic associated symptoms. Associated symptoms include foot paresthesias. Pertinent negatives for diabetes include no blurred vision and no foot ulcerations. There are no hypoglycemic complications. She is following a generally healthy diet. (Does not check BS at home ) Eye exam is current.  Hypertension  This is a chronic problem. The current episode started more than 1 year ago. The problem has been waxing and waning since onset. The problem is uncontrolled. Pertinent negatives include no blurred vision, peripheral edema ("every once in awhile") or shortness of breath. Risk factors for coronary artery disease include diabetes mellitus, dyslipidemia, post-menopausal state, sedentary lifestyle and family history. The current treatment provides mild improvement.  Hyperlipidemia  This is a chronic problem. The current episode started more than 1 year ago. The problem is uncontrolled. Recent lipid tests were reviewed and are high. Pertinent negatives include no shortness of breath. Current antihyperlipidemic treatment includes statins. The current treatment provides moderate improvement of lipids. Risk factors for coronary artery disease include diabetes mellitus, dyslipidemia, obesity, post-menopausal and a sedentary lifestyle.  Asthma  There is no cough, shortness of breath or wheezing. This is a chronic problem. The current episode started more than 1 year ago. The problem occurs intermittently. The problem has been resolved. She reports moderate  improvement on treatment. Her past medical history is significant for asthma.  Depression         This is a chronic problem.  The current episode started more than 1 year ago.   The onset quality is gradual.   The problem occurs intermittently.  Associated symptoms include decreased interest and sad.  Associated symptoms include no helplessness, no hopelessness and not irritable.  Past treatments include SSRIs - Selective serotonin reuptake inhibitors. Diabetic Neuropathy Pt states this pain has improved since gabapentin. States she has intermittent burning pain of 2 out 10.     Review of Systems  Eyes: Negative for blurred vision.  Respiratory: Negative for cough, shortness of breath and wheezing.   Psychiatric/Behavioral: Positive for depression.  All other systems reviewed and are negative.      Objective:   Physical Exam  Constitutional: She is oriented to person, place, and time. She appears well-developed and well-nourished. She is not irritable. No distress.  HENT:  Head: Normocephalic and atraumatic.  Right Ear: External ear normal.  Mouth/Throat: Oropharynx is clear and moist.  Eyes: Pupils are equal, round, and reactive to light.  Neck: Normal range of motion. Neck supple. No thyromegaly present.  Cardiovascular: Normal rate, regular rhythm, normal heart sounds and intact distal pulses.  No murmur heard. Pulmonary/Chest: Effort normal and breath sounds normal. No respiratory distress. She has no wheezes.  Abdominal: Soft. Bowel sounds are normal. She exhibits no distension. There is no tenderness.  Musculoskeletal: Normal range of motion. She exhibits no edema or tenderness.  Neurological: She is alert and oriented to person, place, and time.  Skin: Skin is warm and dry.  Psychiatric: She has a normal mood and affect. Her behavior is normal. Judgment and thought content  normal.  Vitals reviewed.    BP (!) 157/56   Pulse 60   Temp 99.2 F (37.3 C) (Oral)   Ht '5\' 3"'   (1.6 m)   Wt 165 lb (74.8 kg)   BMI 29.23 kg/m      Assessment & Plan:  1. Uncontrolled type 2 diabetes mellitus with diabetic neuropathy, without long-term current use of insulin ([facility]) - Bayer DCA Hb A1c Waived - CMP14+EGFR - Microalbumin / creatinine urine ratio  2. Hypertension associated with diabetes ([place]) - CMP14+EGFR  3. Hyperlipidemia associated with type 2 diabetes mellitus ([facility]) - CMP14+EGFR - Lipid panel  4. Moderate episode of recurrent major depressive disorder ([facility]) - CMP14+EGFR  5. GAD (generalized anxiety disorder)  - CMP14+EGFR  6. Vitamin D deficiency - CMP14+EGFR - VITAMIN D 25 Hydroxy (Vit-D Deficiency, Fractures)  7. Mild intermittent asthma without complication - EUH20+NWNE  8. Diabetic polyneuropathy associated with type 2 diabetes mellitus ([place])  - Bayer DCA Hb A1c Waived - CMP14+EGFR   Continue all meds Labs pending Health Maintenance reviewed Diet and exercise encouraged RTO 3 months   Evelina Dun, FNP

## 2017-10-03 NOTE — Patient Instructions (Signed)
Diabetes Mellitus and Nutrition When you have diabetes (diabetes mellitus), it is very important to have healthy eating habits because your blood sugar (glucose) levels are greatly affected by what you eat and drink. Eating healthy foods in the appropriate amounts, at about the same times every day, can help you:  Control your blood glucose.  Lower your risk of heart disease.  Improve your blood pressure.  Reach or maintain a healthy weight.  Every person with diabetes is different, and each person has different needs for a meal plan. Your health care provider may recommend that you work with a diet and nutrition specialist (dietitian) to make a meal plan that is best for you. Your meal plan may vary depending on factors such as:  The calories you need.  The medicines you take.  Your weight.  Your blood glucose, blood pressure, and cholesterol levels.  Your activity level.  Other health conditions you have, such as heart or kidney disease.  How do carbohydrates affect me? Carbohydrates affect your blood glucose level more than any other type of food. Eating carbohydrates naturally increases the amount of glucose in your blood. Carbohydrate counting is a method for keeping track of how many carbohydrates you eat. Counting carbohydrates is important to keep your blood glucose at a healthy level, especially if you use insulin or take certain oral diabetes medicines. It is important to know how many carbohydrates you can safely have in each meal. This is different for every person. Your dietitian can help you calculate how many carbohydrates you should have at each meal and for snack. Foods that contain carbohydrates include:  Bread, cereal, rice, pasta, and crackers.  Potatoes and corn.  Peas, beans, and lentils.  Milk and yogurt.  Fruit and juice.  Desserts, such as cakes, cookies, ice cream, and candy.  How does alcohol affect me? Alcohol can cause a sudden decrease in blood  glucose (hypoglycemia), especially if you use insulin or take certain oral diabetes medicines. Hypoglycemia can be a life-threatening condition. Symptoms of hypoglycemia (sleepiness, dizziness, and confusion) are similar to symptoms of having too much alcohol. If your health care provider says that alcohol is safe for you, follow these guidelines:  Limit alcohol intake to no more than 1 drink per day for nonpregnant women and 2 drinks per day for men. One drink equals 12 oz of beer, 5 oz of wine, or 1 oz of hard liquor.  Do not drink on an empty stomach.  Keep yourself hydrated with water, diet soda, or unsweetened iced tea.  Keep in mind that regular soda, juice, and other mixers may contain a lot of sugar and must be counted as carbohydrates.  What are tips for following this plan? Reading food labels  Start by checking the serving size on the label. The amount of calories, carbohydrates, fats, and other nutrients listed on the label are based on one serving of the food. Many foods contain more than one serving per package.  Check the total grams (g) of carbohydrates in one serving. You can calculate the number of servings of carbohydrates in one serving by dividing the total carbohydrates by 15. For example, if a food has 30 g of total carbohydrates, it would be equal to 2 servings of carbohydrates.  Check the number of grams (g) of saturated and trans fats in one serving. Choose foods that have low or no amount of these fats.  Check the number of milligrams (mg) of sodium in one serving. Most people   should limit total sodium intake to less than 2,300 mg per day.  Always check the nutrition information of foods labeled as "low-fat" or "nonfat". These foods may be higher in added sugar or refined carbohydrates and should be avoided.  Talk to your dietitian to identify your daily goals for nutrients listed on the label. Shopping  Avoid buying canned, premade, or processed foods. These  foods tend to be high in fat, sodium, and added sugar.  Shop around the outside edge of the grocery store. This includes fresh fruits and vegetables, bulk grains, fresh meats, and fresh dairy. Cooking  Use low-heat cooking methods, such as baking, instead of high-heat cooking methods like deep frying.  Cook using healthy oils, such as olive, canola, or sunflower oil.  Avoid cooking with butter, cream, or high-fat meats. Meal planning  Eat meals and snacks regularly, preferably at the same times every day. Avoid going long periods of time without eating.  Eat foods high in fiber, such as fresh fruits, vegetables, beans, and whole grains. Talk to your dietitian about how many servings of carbohydrates you can eat at each meal.  Eat 4-6 ounces of lean protein each day, such as lean meat, chicken, fish, eggs, or tofu. 1 ounce is equal to 1 ounce of meat, chicken, or fish, 1 egg, or 1/4 cup of tofu.  Eat some foods each day that contain healthy fats, such as avocado, nuts, seeds, and fish. Lifestyle   Check your blood glucose regularly.  Exercise at least 30 minutes 5 or more days each week, or as told by your health care provider.  Take medicines as told by your health care provider.  Do not use any products that contain nicotine or tobacco, such as cigarettes and e-cigarettes. If you need help quitting, ask your health care provider.  Work with a counselor or diabetes educator to identify strategies to manage stress and any emotional and social challenges. What are some questions to ask my health care provider?  Do I need to meet with a diabetes educator?  Do I need to meet with a dietitian?  What number can I call if I have questions?  When are the best times to check my blood glucose? Where to find more information:  American Diabetes Association: diabetes.org/food-and-fitness/food  Academy of Nutrition and Dietetics:  www.eatright.org/resources/health/diseases-and-conditions/diabetes  National Institute of Diabetes and Digestive and Kidney Diseases (NIH): www.niddk.nih.gov/health-information/diabetes/overview/diet-eating-physical-activity Summary  A healthy meal plan will help you control your blood glucose and maintain a healthy lifestyle.  Working with a diet and nutrition specialist (dietitian) can help you make a meal plan that is best for you.  Keep in mind that carbohydrates and alcohol have immediate effects on your blood glucose levels. It is important to count carbohydrates and to use alcohol carefully. This information is not intended to replace advice given to you by your health care provider. Make sure you discuss any questions you have with your health care provider. Document Released: 05/02/2005 Document Revised: 09/09/2016 Document Reviewed: 09/09/2016 Elsevier Interactive Patient Education  2018 Elsevier Inc.  

## 2017-10-04 LAB — LIPID PANEL
CHOLESTEROL TOTAL: 171 mg/dL (ref 100–199)
Chol/HDL Ratio: 3.7 ratio (ref 0.0–4.4)
HDL: 46 mg/dL (ref >39)
LDL Calculated: 102 mg/dL — ABNORMAL HIGH (ref 0–99)
TRIGLYCERIDES: 114 mg/dL (ref 0–149)
VLDL Cholesterol Cal: 23 mg/dL (ref 5–40)

## 2017-10-04 LAB — CMP14+EGFR
ALBUMIN: 4.4 g/dL (ref 3.5–4.8)
ALT: 24 IU/L (ref 0–32)
AST: 32 IU/L (ref 0–40)
Albumin/Globulin Ratio: 1.8 (ref 1.2–2.2)
Alkaline Phosphatase: 51 IU/L (ref 39–117)
BILIRUBIN TOTAL: 0.3 mg/dL (ref 0.0–1.2)
BUN / CREAT RATIO: 15 (ref 12–28)
BUN: 8 mg/dL (ref 8–27)
CHLORIDE: 103 mmol/L (ref 96–106)
CO2: 20 mmol/L (ref 20–29)
CREATININE: 0.52 mg/dL — AB (ref 0.57–1.00)
Calcium: 8.8 mg/dL (ref 8.7–10.3)
GFR calc Af Amer: 107 mL/min/{1.73_m2} (ref >59)
GFR calc non Af Amer: 93 mL/min/{1.73_m2} (ref >59)
GLOBULIN, TOTAL: 2.4 g/dL (ref 1.5–4.5)
GLUCOSE: 163 mg/dL — AB (ref 65–99)
Potassium: 4.2 mmol/L (ref 3.5–5.2)
SODIUM: 142 mmol/L (ref 134–144)
Total Protein: 6.8 g/dL (ref 6.0–8.5)

## 2017-10-04 LAB — MICROALBUMIN / CREATININE URINE RATIO
Creatinine, Urine: 66 mg/dL
MICROALB/CREAT RATIO: 20 mg/g{creat} (ref 0.0–30.0)
MICROALBUM., U, RANDOM: 13.2 ug/mL

## 2017-10-04 LAB — VITAMIN D 25 HYDROXY (VIT D DEFICIENCY, FRACTURES): Vit D, 25-Hydroxy: 49.8 ng/mL (ref 30.0–100.0)

## 2017-10-09 ENCOUNTER — Other Ambulatory Visit: Payer: Self-pay | Admitting: Family

## 2017-10-20 ENCOUNTER — Encounter: Payer: Self-pay | Admitting: "Endocrinology

## 2017-10-20 ENCOUNTER — Ambulatory Visit: Payer: Medicare Other | Admitting: "Endocrinology

## 2017-10-20 ENCOUNTER — Telehealth: Payer: Self-pay | Admitting: Neurology

## 2017-10-20 VITALS — BP 136/61 | HR 68 | Ht 63.0 in | Wt 163.0 lb

## 2017-10-20 DIAGNOSIS — E1140 Type 2 diabetes mellitus with diabetic neuropathy, unspecified: Secondary | ICD-10-CM

## 2017-10-20 DIAGNOSIS — E1165 Type 2 diabetes mellitus with hyperglycemia: Secondary | ICD-10-CM

## 2017-10-20 DIAGNOSIS — E559 Vitamin D deficiency, unspecified: Secondary | ICD-10-CM | POA: Diagnosis not present

## 2017-10-20 DIAGNOSIS — I10 Essential (primary) hypertension: Secondary | ICD-10-CM

## 2017-10-20 DIAGNOSIS — E782 Mixed hyperlipidemia: Secondary | ICD-10-CM | POA: Diagnosis not present

## 2017-10-20 DIAGNOSIS — IMO0002 Reserved for concepts with insufficient information to code with codable children: Secondary | ICD-10-CM

## 2017-10-20 MED ORDER — SITAGLIPTIN PHOSPHATE 50 MG PO TABS: 50.0000 mg | ORAL_TABLET | Freq: Every day | ORAL | 3 refills | Status: DC

## 2017-10-20 NOTE — Telephone Encounter (Signed)
Daughter called and said they were to have a referral for speech therapy from her last visit on 09/17/17 and they have not heard from anyone. Please Call daughter. Thanks

## 2017-10-20 NOTE — Progress Notes (Signed)
Subjective:    Patient ID: Kristi Willis, female    DOB: 03-05-1941. Patient is being seen in f/u  for management of diabetes requested by  Sharion Balloon, FNP  Past Medical History:  Diagnosis Date  . Allergy   . Asthma   . Cancer ([place])    skin  . Diabetes mellitus   . GERD (gastroesophageal reflux disease)    hiatal hernia  . Hypercholesteremia   . Hypertension   . Neuromuscular disorder ([facility])    DM neuropathy  . Neuropathy   . SVT (supraventricular tachycardia) ([facility])    Past Surgical History:  Procedure Laterality Date  . BREAST BIOPSY     left breast biopsy  . LUMBAR LAMINECTOMY  08/10  . SKIN CANCER EXCISION  2005  . SPINE SURGERY     lumbar laminectomy   Social History   Socioeconomic History  . Marital status: Widowed    Spouse name: None  . Number of children: None  . Years of education: None  . Highest education level: None  Social Needs  . Financial resource strain: None  . Food insecurity - worry: None  . Food insecurity - inability: None  . Transportation needs - medical: None  . Transportation needs - non-medical: None  Occupational History  . Occupation: Retired from [employer] but still works part-time  Tobacco Use  . Smoking status: Never Smoker  . Smokeless tobacco: Never Used  Substance and Sexual Activity  . Alcohol use: No  . Drug use: No  . Sexual activity: No  Other Topics Concern  . None  Social History Narrative   Lives with husband, who is a patient of Dr Lovena Le. She is active around the house and exercises regularly.   Outpatient Encounter Medications as of 10/20/2017  Medication Sig  . ACCU-CHEK AVIVA PLUS test strip CHECK BLOOD SUGAR UP TO 4 TIMES A DAY OR AS DIRECTED  . ACCU-CHEK SOFTCLIX LANCETS lancets CHECK BLOOD SUGAR UP TO 4 TIMES A DAY OR AS DIRECTED  . acyclovir ointment (ZOVIRAX) 5 % Apply 1 application topically every 3 (three) hours. Around mouth, Until symptoms clear  . albuterol (VENTOLIN HFA) 108 (90  BASE) MCG/ACT inhaler Inhale 2 puffs into the lungs every 6 (six) hours as needed for wheezing.  Marland Kitchen amLODipine (NORVASC) 5 MG tablet TAKE 1 TABLET ONCE DAILY  . aspirin 81 MG chewable tablet Chew 81 mg by mouth daily.  Marland Kitchen azelastine (ASTELIN) 0.1 % nasal spray Place 2 sprays into both nostrils 2 (two) times daily.  . Blood Glucose Monitoring Suppl (ACCU-CHEK AVIVA PLUS) w/Device KIT CHECK BLOOD SUGAR UP TO 4 TIMES A DAY  . Calcium Carbonate-Vitamin D (CALTRATE 600+D) 600-400 MG-UNIT per tablet Take 1 tablet by mouth 2 (two) times daily.  Marland Kitchen doxazosin (CARDURA) 8 MG tablet TAKE (1) TABLET DAILY FOR HIGH BLOOD PRESSURE.  Marland Kitchen EPIPEN 2-PAK 0.3 MG/0.3ML SOAJ injection Reported on 01/24/2016  . escitalopram (LEXAPRO) 20 MG tablet Take 1 tablet (20 mg total) by mouth daily.  Marland Kitchen gabapentin (NEURONTIN) 100 MG capsule Take 1 capsule (100 mg total) 3 (three) times daily by mouth.  Marland Kitchen ketoconazole (NIZORAL) 2 % cream Apply 1 application topically daily.  Marland Kitchen lisinopril (PRINIVIL,ZESTRIL) 40 MG tablet TAKE 1 TABLET DAILY  . loratadine (CLARITIN) 10 MG tablet Take 1 tablet (10 mg total) by mouth daily.  . metFORMIN (GLUCOPHAGE) 1000 MG tablet Take 1 tablet (1,000 mg total) by mouth 2 (two) times daily with a meal.  .  metoprolol succinate (TOPROL-XL) 100 MG 24 hr tablet TAKE (1) TABLET TWICE A DAY.  . montelukast (SINGULAIR) 10 MG tablet Take 1 tablet (10 mg total) by mouth at bedtime.  . Multiple Vitamin (MULTIVITAMIN) tablet Take 1 tablet by mouth daily.    . Omega-3 Fatty Acids (FISH OIL) 1200 MG CAPS Take by mouth.  . Pyridoxine HCl (VITAMIN B-6 PO) Take 1 tablet by mouth 2 (two) times daily. Reported on 01/24/2016  . ranitidine (ZANTAC) 150 MG tablet Take 150 mg by mouth 2 (two) times daily.  . rosuvastatin (CRESTOR) 10 MG tablet TAKE 1 TABLET DAILY  . sitaGLIPtin (JANUVIA) 50 MG tablet Take 1 tablet (50 mg total) by mouth daily.  . vitamin B-12 (CYANOCOBALAMIN) 500 MCG tablet Take 500 mcg by mouth daily.    .  [DISCONTINUED] sitaGLIPtin (JANUVIA) 50 MG tablet Take 0.5 tablets (25 mg total) by mouth daily.  . [DISCONTINUED] Vitamin D, Ergocalciferol, (DRISDOL) 50000 units CAPS capsule TAKE 1 CAPSULE ONCE A WEEK   No facility-administered encounter medications on file as of 10/20/2017.    ALLERGIES: Allergies  Allergen Reactions  . Dimetapp C [Phenylephrine-Bromphen-Codeine] Itching  . Phenylephrine Hcl Other (See Comments)    unknown  . Ultram [Tramadol Hcl] Other (See Comments)    Insomnia  . Celebrex [Celecoxib] Rash  . Cortisone Rash  . Fenofibrate Rash  . Sulfa Antibiotics Rash   VACCINATION STATUS: Immunization History  Administered Date(s) Administered  . Influenza, High Dose Seasonal PF 06/13/2016, 07/03/2017  . Influenza,inj,Quad PF,6+ Mos 05/20/2013, 05/31/2014, 07/18/2015  . Influenza-Unspecified 08/20/2011  . Pneumococcal Conjugate-13 05/31/2014  . Pneumococcal Polysaccharide-23 07/22/2012  . Tdap 02/02/2015    Diabetes  She presents for her follow-up diabetic visit. She has type 2 diabetes mellitus. Onset time: She was diagnosed at approximate age of 8 years. Her disease course has been improving. There are no hypoglycemic associated symptoms. Pertinent negatives for hypoglycemia include no confusion, headaches, pallor or seizures. Associated symptoms include fatigue and foot paresthesias. Pertinent negatives for diabetes include no chest pain, no polydipsia, no polyphagia and no polyuria. There are no hypoglycemic complications. Symptoms are improving. Diabetic complications include peripheral neuropathy. Risk factors for coronary artery disease include diabetes mellitus, dyslipidemia, hypertension and sedentary lifestyle. Current diabetic treatments: Metformin 1000 mg by mouth twice a day, Januvia 50 mg p.o. daily. Her weight is decreasing steadily. She is following a generally unhealthy diet. When asked about meal planning, she reported none. She has not had a previous visit with  a dietitian. She rarely participates in exercise. An ACE inhibitor/angiotensin II receptor blocker is being taken. She sees a podiatrist.Eye exam is current.  Hyperlipidemia  This is a chronic problem. The current episode started more than 1 year ago. The problem is controlled. Recent lipid tests were reviewed and are high. Exacerbating diseases include diabetes. Pertinent negatives include no chest pain, myalgias or shortness of breath. Current antihyperlipidemic treatment includes statins. Risk factors for coronary artery disease include dyslipidemia, diabetes mellitus, hypertension and a sedentary lifestyle.  Hypertension  This is a chronic problem. The current episode started more than 1 year ago. The problem is controlled. Pertinent negatives include no chest pain, headaches, palpitations or shortness of breath. Risk factors for coronary artery disease include diabetes mellitus, dyslipidemia, sedentary lifestyle and family history. Past treatments include ACE inhibitors and beta blockers.    Review of Systems  Constitutional: Positive for fatigue. Negative for chills, fever and unexpected weight change.  HENT: Negative for trouble swallowing and voice change.  Eyes: Negative for visual disturbance.  Respiratory: Negative for cough, shortness of breath and wheezing.   Cardiovascular: Negative for chest pain, palpitations and leg swelling.  Gastrointestinal: Negative for diarrhea, nausea and vomiting.  Endocrine: Negative for cold intolerance, heat intolerance, polydipsia, polyphagia and polyuria.  Musculoskeletal: Negative for arthralgias and myalgias.  Skin: Negative for color change, pallor, rash and wound.  Neurological: Negative for seizures and headaches.  Psychiatric/Behavioral: Negative for confusion and suicidal ideas.    Objective:    BP 136/61   Pulse 68   Ht _0  (1.6 m)   Wt 163 lb (73.9 kg)   BMI 28.87 kg/m   Wt Readings from Last 3 Encounters:  10/20/17 163 lb (73.9  kg)  10/03/17 165 lb (74.8 kg)  09/17/17 165 lb (74.8 kg)    Physical Exam  Constitutional: She is oriented to person, place, and time. She appears well-developed.  HENT:  Head: Normocephalic and atraumatic.  Eyes: EOM are normal.  Neck: Normal range of motion. Neck supple. No tracheal deviation present. No thyromegaly present.  Cardiovascular: Normal rate and regular rhythm.  Pulmonary/Chest: Effort normal and breath sounds normal.  Abdominal: Soft. Bowel sounds are normal. There is no tenderness. There is no guarding.  Musculoskeletal: Normal range of motion. She exhibits no edema.  Neurological: She is alert and oriented to person, place, and time. She has normal reflexes. No cranial nerve deficit. Coordination normal.  Skin: Skin is warm and dry. No rash noted. No erythema. No pallor.  Psychiatric: She has a normal mood and affect. Judgment normal.    CMP     Component Value Date/Time   NA 142 10/03/2017 1038   K 4.2 10/03/2017 1038   CL 103 10/03/2017 1038   CO2 20 10/03/2017 1038   GLUCOSE 163 (H) 10/03/2017 1038   GLUCOSE 187 (H) 02/04/2016 1934   BUN 8 10/03/2017 1038   CREATININE 0.52 (L) 10/03/2017 1038   CREATININE 0.54 03/02/2013 0931   CALCIUM 8.8 10/03/2017 1038   PROT 6.8 10/03/2017 1038   ALBUMIN 4.4 10/03/2017 1038   AST 32 10/03/2017 1038   ALT 24 10/03/2017 1038   ALKPHOS 51 10/03/2017 1038   BILITOT 0.3 10/03/2017 1038   GFRNONAA 93 10/03/2017 1038   GFRNONAA >89 03/02/2013 0931   GFRAA 107 10/03/2017 1038   GFRAA >89 03/02/2013 0931     Diabetic Labs (most recent): Lab Results  Component Value Date   HGBA1C 7.1 10/03/2017   HGBA1C 7.9 (H) 03/10/2017   HGBA1C 8.0 (H) 12/09/2016     Lipid Panel ( most recent) Lipid Panel     Component Value Date/Time   CHOL 171 10/03/2017 1038   CHOL 177 02/23/2013 0911   TRIG 114 10/03/2017 1038   TRIG 203 (H) 01/12/2015 0915   TRIG 293 (H) 02/23/2013 0911   HDL 46 10/03/2017 1038   HDL 40 01/12/2015  0915   HDL 48 02/23/2013 0911   CHOLHDL 3.7 10/03/2017 1038   LDLCALC 102 (H) 10/03/2017 1038   LDLCALC 119 (H) 10/06/2013 1012   LDLCALC 70 02/23/2013 0911      Assessment & Plan:   1. Uncontrolled type 2 diabetes mellitus with diabetic neuropathy, without long-term current use of insulin ([place])   - Patient has currently uncontrolled symptomatic type 2 DM since  77 years of age. -She came with significantly improved A1c of 7.1%, generally improving from 9.3%.   Recent labs reviewed, showing normal renal function.   Her diabetes is complicated by  referral neuropathy and patient remains at a high risk for more acute and chronic complications of diabetes which include CAD, CVA, CKD, retinopathy, and neuropathy. These are all discussed in detail with the patient.  - I have counseled the patient on diet management and weight loss, by adopting a carbohydrate restricted/protein rich diet.  -  Suggestion is made for her to avoid simple carbohydrates  from her diet including Cakes, Sweet Desserts / Pastries, Ice Cream, Soda (diet and regular), Sweet Tea, Candies, Chips, Cookies, Store Bought Juices, Alcohol in Excess of  1-2 drinks a day, Artificial Sweeteners, and "Sugar-free" Products. This will help patient to have stable blood glucose profile and potentially avoid unintended weight gain.  - I encouraged the patient to switch to  unprocessed or minimally processed complex starch and increased protein intake (animal or plant source), fruits, and vegetables.  - Patient is advised to stick to a routine mealtimes to eat 3 meals  a day and avoid unnecessary snacks ( to snack only to correct hypoglycemia).   - I have approached patient with the following individualized plan to manage diabetes and patient agrees:   -She maintained reasonable control of diabetes.  - I will continue metformin 1000 mg by mouth twice a day, therapeutically suitable for patient. - I will continue Januvia 50 mg  by  mouth every morning with breakfast.  Side effects and precautions discussed with her.  - Patient specific target  A1c;  LDL, HDL, Triglycerides, and  Waist Circumference were discussed in detail.  2) BP/HTN: Her blood pressure is controlled to target.  I advised her to continue her current blood pressure medications including  ACEI/ARB. 3) Lipids/HPL: Uncontrolled but improving, LDL 102.    Patient is advised tocontinue statins. 4)  Weight/Diet: CDE Consult has been initiated , exercise, and detailed carbohydrates information provided.  5) Chronic Care/Health Maintenance:  -Patient is on ACEI/ARB and Statin medications and encouraged to continue to follow up with Ophthalmology, Podiatrist at least yearly or according to recommendations, and advised to  stay away from smoking. I have recommended yearly flu vaccine and pneumonia vaccination at least every 5 years; moderate intensity exercise for up to 150 minutes weekly; and  sleep for at least 7 hours a day.  - I advised patient to maintain close follow up with Sharion Balloon, FNP for primary care needs.  - Time spent with the patient: 25 min, of which >50% was spent in reviewing her  current and  previous labs, previous treatments, and medications  doses and developing a plan for long-term care.    Follow up plan: - Return in about 4 months (around 02/19/2018) for follow up with pre-visit labs.  Glade Lloyd, MD Phone: [phone]  Fax: [phone]  -  This note was partially dictated with voice recognition software. Similar sounding words can be transcribed inadequately or may not  be corrected upon review.  10/20/2017, 1:55 PM

## 2017-10-20 NOTE — Patient Instructions (Signed)

## 2017-10-20 NOTE — Telephone Encounter (Signed)
Referral faxed with receive confirmation.

## 2017-10-27 ENCOUNTER — Other Ambulatory Visit: Payer: Self-pay | Admitting: "Endocrinology

## 2017-10-31 ENCOUNTER — Telehealth: Payer: Self-pay | Admitting: Neurology

## 2017-10-31 ENCOUNTER — Other Ambulatory Visit: Payer: Self-pay

## 2017-10-31 DIAGNOSIS — R4789 Other speech disturbances: Secondary | ICD-10-CM

## 2017-10-31 NOTE — Telephone Encounter (Signed)
New referral placed.

## 2017-10-31 NOTE — Telephone Encounter (Signed)
Daughter called  Left message on voice mail and said they were to have a referral for speech therapy from her last visit on 09/17/17 and they have not heard from anyone. Please Call daughter.

## 2017-11-21 ENCOUNTER — Ambulatory Visit: Payer: Medicare Other | Attending: Neurology | Admitting: Speech Pathology

## 2017-11-21 ENCOUNTER — Encounter: Payer: Self-pay | Admitting: Speech Pathology

## 2017-11-21 ENCOUNTER — Other Ambulatory Visit: Payer: Self-pay

## 2017-11-21 DIAGNOSIS — R4701 Aphasia: Secondary | ICD-10-CM | POA: Diagnosis not present

## 2017-11-21 DIAGNOSIS — R482 Apraxia: Secondary | ICD-10-CM

## 2017-11-21 NOTE — Therapy (Signed)
[facility] [address] [place], [place], [address] Phone: [phone]   Fax:  [phone]  Speech Language Pathology Evaluation  Patient Details  Name: Kristi Willis MRN: [id number] Date of Birth: [birth year] Referring Provider: Dr. Daiva Nakayama   Encounter Date: 11/21/2017  End of Session - 11/21/17 1305    Visit Number  1    Number of Visits  17    Date for SLP Re-Evaluation  01/16/18    SLP Start Time  0844    SLP Stop Time   0932    SLP Time Calculation (min)  48 min    Activity Tolerance  Patient tolerated treatment well       Past Medical History:  Diagnosis Date  . Allergy   . Asthma   . Cancer ([place])    skin  . Diabetes mellitus   . GERD (gastroesophageal reflux disease)    hiatal hernia  . Hypercholesteremia   . Hypertension   . Neuromuscular disorder (HCC)    DM neuropathy  . Neuropathy   . SVT (supraventricular tachycardia) (HCC)     Past Surgical History:  Procedure Laterality Date  . BREAST BIOPSY     left breast biopsy  . LUMBAR LAMINECTOMY  08/10  . SKIN CANCER EXCISION  2005  . SPINE SURGERY     lumbar laminectomy    There were no vitals filed for this visit.  Subjective Assessment - 11/21/17 1240    Subjective  "I um I know it but I um  um I can't get it out    Patient is accompained by:  Family member         SLP Evaluation [facility] - 11/21/17 0855      SLP Visit Information   SLP Received On  11/21/17    Referring Provider  Dr. Daiva Nakayama    Onset Date  2016    Medical Diagnosis  MCI      Subjective   Patient/Family Stated Goal  "To get better"      General Information   HPI  She underwent Neuropsychological testing in January 2018 with a diagnosis of Mild Cognitive Impairment, with executive dysfunction noted across multiple tasks. There was not a consistent pattern of memory/hippocampal consolidation dysfunction noted. Testing was not consistent with  Alzheimer's disease. Speech difficulties were noted during testing, but there was no evidence of impairment in semantic retrieval or comprehension, hence primary progressive aphasia was not considered likely. There was note of significant grief-related depression and psychosocial stress likely contributing to cognitive difficulties.    Mobility Status  walks independently      Balance Screen   Has the patient fallen in the past 6 months  No    Has the patient had a decrease in activity level because of a fear of falling?   No    Is the patient reluctant to leave their home because of a fear of falling?   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   Overall Cognitive Status  History of cognitive impairments - at baseline  Auditory Comprehension   Overall Auditory Comprehension  Impaired    Yes/No Questions  Impaired    Basic Immediate Environment Questions  75-100% accurate    Complex Questions  50-74% accurate    Paragraph Comprehension (via yes/no questions)  51-75% accurate    Commands  Impaired    Two Step Basic Commands  75-100% accurate    Multistep Basic Commands  75-100% accurate    Other Commands Comment  Pt did not follow repeated direction to wait until I was finished before starting multistep commands, despite holding my hand up as visual cue to wait     Conversation  Complex    Interfering Components  Hearing;Processing speed    EffectiveTechniques  Pausing;Repetition;Slowed speech;Stressing words;Visual/Gestural cues;Extra processing time    Overall Auditory Comprehension Comments  daughter reports hearing loss which may affect performance on auditory comprehension. Recommend audiology  referral      Visual Recognition/Discrimination   Discrimination  Not tested      Reading Comprehension   Reading Status  Within funtional limits at word and simple sentence - further assessment may be warr      Expression   Primary Mode of Expression  Verbal      Verbal Expression   Overall Verbal Expression  Impaired    Initiation  Impaired    Automatic Speech  Social Response;Counting;Day of week;Month of year mildy halting, but accurate without perseverations    Level of Generative/Spontaneous Verbalization  Conversation    Repetition  Impaired    Level of Impairment  Word level;Phrase level    Naming  Impairment    Responsive  76-100% accurate    Confrontation  25-49% accurate    Convergent  50-74% accurate    Divergent  Not tested    Other Naming Comments  confrontation naming of simple items in environment 10/10, see BNT-2    Verbal Errors  Semantic paraphasias;Phonemic paraphasias;Neologisms;Perseveration;Not aware of errors;Inconsistent    Pragmatics  No impairment anxious    Effective Techniques  Phonemic cues;Sentence completion;Articulatory cues    Other Verbal Expression Comments  Cookie theft - telegraphic; pronoun errors      Written Expression   Dominant Hand  Right    Written Expression  Within Functional Limits    Overall Writen Expression  check writing task [facility] - further assessment may be warranted      Oral Motor/Sensory Function   Overall Oral Motor/Sensory Function  Appears within functional limits for tasks assessed      Motor Speech   Overall Motor Speech  Impaired    Respiration  Within functional limits    Phonation  Normal    Resonance  Within functional limits    Articulation  Within functional limitis    Intelligibility  Intelligibility reduced    Word  75-100% accurate    Phrase  75-100% accurate    Sentence  50-74% accurate    Conversation  50-74% accurate    Motor Planning  Impaired    Level of Impairment  Word    Motor Speech Errors   Groping for words;Inconsistent    Effective Techniques  Slow rate;Pause Irregular diadochokinetcs irregular, with vowel distortions      Standardized Assessments   Standardized Assessments   Boston Naming Test-2nd edition    Boston Naming Test-2nd edition   32/60                      SLP Education - 11/21/17 1304    Education  provided  Yes    Education Details  Language activities to do at home; goals/POC for ST    Person(s) Educated  Patient;Child(ren)    Methods  Explanation;Verbal cues;Handout    Comprehension  Verbalized understanding;Need further instruction       SLP Short Term Goals - 11/21/17 1327      SLP SHORT TERM GOAL #1   Title  Pt will complete mildly complex naming tasks with 80% accuracy and occasional min A    Time  4    Period  Weeks    Status  New      SLP SHORT TERM GOAL #2   Title  Pt will complete HEP for verbal apraxia with occasional min A over 2 sessions    Time  4    Period  Weeks    Status  New      SLP SHORT TERM GOAL #3   Title  Pt will utilize compensations for dynomia and verbal apraxia 10/12 sentences in structured task with occaional min A    Time  4    Period  Weeks    Status  New      SLP SHORT TERM GOAL #4   Title  Further assessment of reading comprhension and written expression as indicated    Time  4    Period  Weeks    Status  New       SLP Long Term Goals - 11/21/17 1329      SLP LONG TERM GOAL #1   Title  Pt will utilize compensations for dynomia/verbal apraxia 3/5 opportunites during simple 15 minute conversation    Time  8    Period  Weeks    Status  New      SLP LONG TERM GOAL #2   Title  Pt will utilize multimodal communication to augment verbal expression in conversation as needed over 2 sessions with occasional min A    Time  8    Period  Weeks      SLP LONG TERM GOAL #3   Title  Pt will ID and attempt to self correct verbal errors in structured speech tasks with occasional min A    Time  8     Period  Weeks    Status  New       Plan - 11/21/17 1306    Clinical Impression Statement  Mrs. Prell is referred for outpt ST by her neurologist due to ongoing speech and language difficulties, which began in 2014/12/08 after her husband died. See neurophych testing. 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 Auditory comprehension was impaired at complex yes/no questions (6/10), mildly complex directions and paragraph level. She usually requested a repetition of complex questions. Daughter suspects hearing loss, which can affect performance on auditory comprehension tasks. Pt also with poor awareness of paraphasias/distortions. I made every effort to speak loudly in close proximity to pt. Automatic speech is intact, however slight halting on days and months noted. Repetition is impaired at [facility] word level and phrase level. Simple confrontation and responsive naming of  high frequency words/objects is intact. Pt scored a 32/60 on the Boston Namint Test - 2 (BNT-2), which is 2SD below the mean for her age. Divergent naming in simple category was impaired - pt named 9 animals in 1 minute (11-15 iw WFL). On the BNT, divergent naming, and in conversation phonemic and semantic paraphasias are prevelent. Neologisms occur occasionally.  For example, pt stated "  telephope/stethescope", "pegalen/pengiun" (the target word was pelican), "telephant/elephant" and "diamonds/dice" (target word was dominoes, which she then pronounced "domominose," and "doghoe/groundhog" Pt mostly unware of her errors with minimal attempt to correct her errors. The Cookie Theft description was telegraphic "Boy doing the cookies and falling" and with pronoun error using "He" while pointing to mom at the sink "He's doing the dish water." Again with no awareness of her error. Rapid alternating speech tasks (Pa-Ta-Ka combinatoins) resulted in vowel distortions (Pay/tay for pa/ta) , halting and were  irregular. She did not accurately perform Pa Ta Ka when all 3 were combined. Reading instructions for check writng  task was intact, check writing intact., simple check balancing intact. At this time, pt's daughter and sister are communicating for her. Daughter is taking all of pt's phone calls. Further reading comprehension and written expression assessment may be warranted. Daughter reports pt was an avid reader, but has not read a book since 2016. I  recommend skilled ST to maximize communication for improved independence and to reduce caregiver burden.   I also recommend audiology consult to r/o hearing loss.   Speech Therapy Frequency  2x / week    Duration  -- 8 weeks, or 16 visits total    Treatment/Interventions  SLP instruction and feedback;Compensatory strategies;Functional tasks;Cognitive reorganization;Internal/external aids;Multimodal communcation approach;Patient/family education;Language facilitation;Environmental controls;Cueing hierarchy    Potential to Achieve Goals  Fair    Potential Considerations  Previous level of function;Severity of impairments    Consulted and Agree with Plan of Care  Patient;Family member/caregiver    Family Member Consulted  daughter, [name]       Patient will benefit from skilled therapeutic intervention in order to improve the following deficits and impairments:   Aphasia  Verbal apraxia    Problem List Patient Active Problem List   Diagnosis Date Noted  . Essential hypertension 10/03/2017  . GAD (generalized anxiety disorder) 07/03/2017  . Uncontrolled type 2 diabetes mellitus with diabetic neuropathy, without long-term current use of insulin ([place]) 09/27/2016  . Depression 03/29/2016  . Insomnia 07/18/2015  . Vitamin D deficiency 10/11/2013  . Mixed hyperlipidemia 05/27/2013  . Right carotid bruit 03/02/2013  . Degenerative disc disease, lumbar 11/27/2012  . Asthma 11/13/2010  . Hyperlipidemia associated with type 2 diabetes mellitus  ([place]) 11/13/2010  . Cancer of skin 11/13/2010  . Diabetic neuropathy ([place]) 11/13/2010  . Wide-complex tachycardia==Atrial Tachycardia 11/13/2010    [place], Annye Rusk MS, CCC-SLP 11/21/2017, 1:34 PM  [facility] [address] [place], [place], [address] Phone: [phone]   Fax:  [phone]  Name: Kristi Willis MRN: [id number] Date of Birth: Aug 30, 1940

## 2017-11-21 NOTE — Patient Instructions (Signed)
  [place] Name words in categories (ie: Colors, christmas things, presidents etc)  Audiology evaluation   Word finding  Verbal apraxia

## 2017-11-25 ENCOUNTER — Other Ambulatory Visit: Payer: Self-pay | Admitting: Family

## 2017-12-11 ENCOUNTER — Ambulatory Visit: Payer: Medicare Other | Admitting: Speech Pathology

## 2017-12-11 DIAGNOSIS — R482 Apraxia: Secondary | ICD-10-CM

## 2017-12-11 DIAGNOSIS — R4701 Aphasia: Secondary | ICD-10-CM | POA: Diagnosis not present

## 2017-12-11 NOTE — Therapy (Signed)
[facility] [address] [place], [place], [address] Phone: [phone]   Fax:  [phone]  Speech Language Pathology Treatment  Patient Details  Name: Kristi Willis MRN: [id number] Date of Birth: 1940/12/06 Referring Provider: Dr. Daiva Nakayama   Encounter Date: 12/11/2017  End of Session - 12/11/17 1605    Visit Number  2    Number of Visits  17    Date for SLP Re-Evaluation  01/16/18    SLP Start Time  1446    SLP Stop Time   1540    SLP Time Calculation (min)  54 min    Activity Tolerance  Patient tolerated treatment well       Past Medical History:  Diagnosis Date  . Allergy   . Asthma   . Cancer ([place])    skin  . Diabetes mellitus   . GERD (gastroesophageal reflux disease)    hiatal hernia  . Hypercholesteremia   . Hypertension   . Neuromuscular disorder (HCC)    DM neuropathy  . Neuropathy   . SVT (supraventricular tachycardia) (HCC)     Past Surgical History:  Procedure Laterality Date  . BREAST BIOPSY     left breast biopsy  . LUMBAR LAMINECTOMY  08/10  . SKIN CANCER EXCISION  2005  . SPINE SURGERY     lumbar laminectomy    There were no vitals filed for this visit.  Subjective Assessment - 12/11/17 1447    Subjective  "It's better in the mornings."    Patient is accompained by:  Family member [name]    Currently in Pain?  Yes    Pain Score  2     Pain Location  Wrist    Pain Orientation  Left    Pain Descriptors / Indicators  Aching    Pain Type  Chronic pain    Pain Onset  More than a month ago            ADULT SLP TREATMENT - 12/11/17 1446      General Information   Behavior/Cognition  Alert;Cooperative;Pleasant mood    Patient Positioning  Upright in chair      Treatment Provided   Treatment provided  Cognitive-Linquistic      Pain Assessment   Pain Assessment  No/denies pain      Cognitive-Linquistic Treatment   Treatment focused on  Aphasia;Apraxia     Skilled Treatment  Initiated training in multimodal communication strategies, including writing, gestures, drawing and description. Pt, daughter described communication breakdown which occurred earlier today, with pt attempting to describe an illness of an acquaintance who had recently passed away. SLP modeled conversational supports, demonstrating use of written key words, confirmation of pt's message and requesting clarification when appropriate; pt able to communicate her message successfully with occasional min-mod A. In confrontation naming errors/anomia, pt primarily used description (rare min A) and writing (mod-max A) for self-cuing. Max A required for phrase completion and naming synonyms; suspect fatigue/anxiety impacting at end of session.       Assessment / Recommendations / Plan   Plan  Continue with current plan of care      Progression Toward Goals   Progression toward goals  Progressing toward goals       SLP Education - 12/11/17 1605    Education provided  Yes    Education Details  aphasia, multimodal communication strategies    Person(s) Educated  Patient;Spouse    Methods  Explanation;Demonstration;Verbal cues  Comprehension  Verbalized understanding;Need further instruction;Returned demonstration;Verbal cues required       SLP Short Term Goals - 12/11/17 1617      SLP SHORT TERM GOAL #1   Title  Pt will complete mildly complex naming tasks with 80% accuracy and occasional min A    Time  4    Period  Weeks    Status  On-going      SLP SHORT TERM GOAL #2   Title  Pt will complete HEP for verbal apraxia with occasional min A over 2 sessions    Time  4    Period  Weeks    Status  On-going      SLP SHORT TERM GOAL #3   Title  Pt will utilize compensations for dynomia and verbal apraxia 10/12 sentences in structured task with occaional min A    Time  4    Period  Weeks    Status  On-going      SLP SHORT TERM GOAL #4   Title  Further assessment of reading  comprhension and written expression as indicated    Time  4    Period  Weeks    Status  On-going       SLP Long Term Goals - 12/11/17 1617      SLP LONG TERM GOAL #1   Title  Pt will utilize compensations for dynomia/verbal apraxia 3/5 opportunites during simple 15 minute conversation    Time  8    Period  Weeks    Status  On-going      SLP LONG TERM GOAL #2   Title  Pt will utilize multimodal communication to augment verbal expression in conversation as needed over 2 sessions with occasional min A    Time  8    Period  Weeks    Status  On-going      SLP LONG TERM GOAL #3   Title  Pt will ID and attempt to self correct verbal errors in structured speech tasks with occasional min A    Time  8    Period  Weeks    Status  On-going       Plan - 12/11/17 1606    Clinical Impression Statement  [name] is referred for outpt ST by her neurologist due to ongoing speech and language difficulties. See neurophych testing. Today, she presents with moderate non fluent type aphasia. Agrammatism, phonemic and semantic paraphasias evident in conversation. Multimodal communication strategies including written letters, keywords, drawings, gestures and descriptions were effective for facilitating word retrieval. Articulatory/kinematic cuing effective for apraxic errors. Daughter continues to express concerns re: hearing loss. Encouraged follow-up for testing with audiologist. Further reading comprehension and written expression assessment may be warranted. I recommend skilled ST to maximize communication for improved independence and to reduce caregiver burden.    Speech Therapy Frequency  2x / week    Treatment/Interventions  SLP instruction and feedback;Compensatory strategies;Functional tasks;Cognitive reorganization;Internal/external aids;Multimodal communcation approach;Patient/family education;Language facilitation;Environmental controls;Cueing hierarchy    Potential to Achieve Goals  Fair     Potential Considerations  Previous level of function;Severity of impairments    Consulted and Agree with Plan of Care  Patient;Family member/caregiver    Family Member Consulted  daughter, [name]       Patient will benefit from skilled therapeutic intervention in order to improve the following deficits and impairments:   Aphasia  Verbal apraxia    Problem List Patient Active Problem List   Diagnosis Date Noted  .  Essential hypertension 10/03/2017  . GAD (generalized anxiety disorder) 07/03/2017  . Uncontrolled type 2 diabetes mellitus with diabetic neuropathy, without long-term current use of insulin ([place]) 09/27/2016  . Depression 03/29/2016  . Insomnia 07/18/2015  . Vitamin D deficiency 10/11/2013  . Mixed hyperlipidemia 05/27/2013  . Right carotid bruit 03/02/2013  . Degenerative disc disease, lumbar 11/27/2012  . Asthma 11/13/2010  . Hyperlipidemia associated with type 2 diabetes mellitus ([place]) 11/13/2010  . Cancer of skin 11/13/2010  . Diabetic neuropathy ([place]) 11/13/2010  . Wide-complex tachycardia==Atrial Tachycardia 11/13/2010   Deneise Lever, [place], CCC-SLP Speech-Language Pathologist  Aliene Altes 12/11/2017, 4:25 PM  [facility] [address] [place], [place], [address] Phone: [phone]   Fax:  [phone]   Name: MAXYNE DEROCHER MRN: [id number] Date of Birth: 05-09-1941

## 2017-12-12 ENCOUNTER — Ambulatory Visit: Payer: Medicare Other

## 2017-12-12 DIAGNOSIS — R482 Apraxia: Secondary | ICD-10-CM

## 2017-12-12 DIAGNOSIS — R4701 Aphasia: Secondary | ICD-10-CM

## 2017-12-12 NOTE — Therapy (Signed)
[facility] [address] [place], [place], [address] Phone: [phone]   Fax:  [phone]  Speech Language Pathology Treatment  Patient Details  Name: Kristi Willis MRN: [id number] Date of Birth: [birth year] Referring Provider: Dr. Ellouise Willis   Encounter Date: 12/12/2017  End of Session - 12/12/17 1640    Visit Number  3    Number of Visits  17    Date for SLP Re-Evaluation  01/16/18    SLP Start Time  1533    SLP Stop Time   1615    SLP Time Calculation (min)  42 min    Activity Tolerance  Patient tolerated treatment well       Past Medical History:  Diagnosis Date  . Allergy   . Asthma   . Cancer ([place])    skin  . Diabetes mellitus   . GERD (gastroesophageal reflux disease)    hiatal hernia  . Hypercholesteremia   . Hypertension   . Neuromuscular disorder (HCC)    DM neuropathy  . Neuropathy   . SVT (supraventricular tachycardia) (HCC)     Past Surgical History:  Procedure Laterality Date  . BREAST BIOPSY     left breast biopsy  . LUMBAR LAMINECTOMY  08/10  . SKIN CANCER EXCISION  2005  . SPINE SURGERY     lumbar laminectomy    There were no vitals filed for this visit.  Subjective Assessment - 12/12/17 1537    Subjective  "Kristi Willis"    Patient is accompained by:  Family member Willis    Currently in Pain?  No/denies            ADULT SLP TREATMENT - 12/12/17 1537      General Information   Behavior/Cognition  Alert;Cooperative;Pleasant mood      Treatment Provided   Treatment provided  Cognitive-Linquistic      Cognitive-Linquistic Treatment   Treatment focused on  Aphasia;Apraxia    Skilled Treatment  Reiterated copmensations to pt today, and demonstrated each for pt/Willis. Pt communicated she wanted SLP to explain her disorder to her Willis, with extra time. SLP educated Willis re: Kristi Willis and primary progressive aphasia which pt's symptoms are not unlike, however SLP  told Willis that only neurologist can make that diagnosis, but that pt shows symptoms of aphasia nonetheless. Simple opposites were told to SLP with 95% success; less common opposites were stated 88% with rare min A; occasional mod-max A needed for improvement to 95%+. Pt described pictures to SLP with 80% success, so that he could guess. Pt did spontaneously use compensation of gesture x2, and used writing x1 with SLP cue - aphasic errors noted in pt's spelling, but SLP told pt to try because it might narrow pt's message intent for her listener.      Assessment / Recommendations / Plan   Plan  Continue with current plan of care      Progression Toward Goals   Progression toward goals  Progressing toward goals       SLP Education - 12/12/17 1639    Education provided  Yes    Education Details  aphasia, language compensations/multimodal strategies, cueing techniques    Person(s) Educated  Patient;Caregiver(s)    Methods  Explanation;Demonstration    Comprehension  Verbalized understanding       SLP Short Term Goals - 12/12/17 1642      SLP SHORT TERM GOAL #1   Title  Pt will complete mildly complex naming tasks  with 80% accuracy and occasional min A    Time  4    Period  Weeks    Status  On-going      SLP SHORT TERM GOAL #2   Title  Pt will complete HEP for verbal apraxia with occasional min A over 2 sessions    Time  4    Period  Weeks    Status  On-going      SLP SHORT TERM GOAL #3   Title  Pt will utilize compensations for dynomia and verbal apraxia 10/12 sentences in structured task with occaional min A    Time  4    Period  Weeks    Status  On-going      SLP SHORT TERM GOAL #4   Title  Further assessment of reading comprhension and written expression as indicated    Time  4    Period  Weeks    Status  On-going       SLP Long Term Goals - 12/12/17 1642      SLP LONG TERM GOAL #1   Title  Pt will utilize compensations for dynomia/verbal apraxia 3/5 opportunites  during simple 15 minute conversation    Time  8    Period  Weeks    Status  On-going      SLP LONG TERM GOAL #2   Title  Pt will utilize multimodal communication to augment verbal expression in conversation as needed over 2 sessions with occasional min A    Time  8    Period  Weeks    Status  On-going      SLP LONG TERM GOAL #3   Title  Pt will ID and attempt to self correct verbal errors in structured speech tasks with occasional min A    Time  8    Period  Weeks    Status  On-going       Plan - 12/12/17 1640    Clinical Impression Statement  Kristi Willis is referred for outpt ST by her neurologist due to ongoing speech and language difficulties. Today, she continues to present with moderate non fluent type aphasia. Agrammatism, phonemic and semantic paraphasias evident in conversation. Pt abandoned utterances at times as well. Multimodal communication strategies were reviewed and used with SLP cues and without cues x2. Further reading comprehension and written expression assessment may be warranted. I recommend skilled ST to maximize communication for improved independence and to reduce caregiver burden.    Speech Therapy Frequency  2x / week    Treatment/Interventions  SLP instruction and feedback;Compensatory strategies;Functional tasks;Cognitive reorganization;Internal/external aids;Multimodal communcation approach;Patient/family education;Language facilitation;Environmental controls;Cueing hierarchy    Potential to Achieve Goals  Fair    Potential Considerations  Previous level of function;Severity of impairments    Consulted and Agree with Plan of Care  Patient;Family member/caregiver    Family Member Consulted  daughter, Kristi Willis       Patient will benefit from skilled therapeutic intervention in order to improve the following deficits and impairments:   Aphasia  Verbal apraxia    Problem List Patient Active Problem List   Diagnosis Date Noted  . Essential hypertension  10/03/2017  . GAD (generalized anxiety disorder) 07/03/2017  . Uncontrolled type 2 diabetes mellitus with diabetic neuropathy, without long-term current use of insulin ([place]) 09/27/2016  . Depression 03/29/2016  . Insomnia 07/18/2015  . Vitamin D deficiency 10/11/2013  . Mixed hyperlipidemia 05/27/2013  . Right carotid bruit 03/02/2013  . Degenerative disc disease, lumbar  11/27/2012  . Asthma 11/13/2010  . Hyperlipidemia associated with type 2 diabetes mellitus ([place]) 11/13/2010  . Cancer of skin 11/13/2010  . Diabetic neuropathy ([place]) 11/13/2010  . Wide-complex tachycardia==Atrial Tachycardia 11/13/2010    [facility] ,MS, CCC-SLP  12/12/2017, 4:43 PM  [facility] [address] [place], [place], [address] Phone: [phone]   Fax:  [phone]   Name: Kristi Willis MRN: [id number] Date of Birth: 1941/03/27

## 2017-12-12 NOTE — Patient Instructions (Signed)
  Please complete the assigned speech therapy homework prior to your next session and return it to the speech therapist at your next visit.  

## 2017-12-16 ENCOUNTER — Ambulatory Visit: Payer: Medicare Other | Admitting: Speech Pathology

## 2017-12-16 ENCOUNTER — Encounter: Payer: Self-pay | Admitting: Speech Pathology

## 2017-12-16 DIAGNOSIS — R482 Apraxia: Secondary | ICD-10-CM

## 2017-12-16 DIAGNOSIS — R4701 Aphasia: Secondary | ICD-10-CM | POA: Diagnosis not present

## 2017-12-16 NOTE — Therapy (Signed)
[facility] [address] [place], [place], [address] Phone: [phone]   Fax:  [phone]  Speech Language Pathology Treatment  Patient Details  Name: Kristi Willis MRN: [id number] Date of Birth: [birth year] Referring Provider: Dr. Daiva Nakayama   Encounter Date: 12/16/2017  End of Session - 12/16/17 1531    Visit Number  4    Number of Visits  17    Date for SLP Re-Evaluation  01/16/18    SLP Start Time  6378    SLP Stop Time   1315    SLP Time Calculation (min)  44 min    Activity Tolerance  Patient tolerated treatment well       Past Medical History:  Diagnosis Date  . Allergy   . Asthma   . Cancer ([place])    skin  . Diabetes mellitus   . GERD (gastroesophageal reflux disease)    hiatal hernia  . Hypercholesteremia   . Hypertension   . Neuromuscular disorder (HCC)    DM neuropathy  . Neuropathy   . SVT (supraventricular tachycardia) (HCC)     Past Surgical History:  Procedure Laterality Date  . BREAST BIOPSY     left breast biopsy  . LUMBAR LAMINECTOMY  08/10  . SKIN CANCER EXCISION  2005  . SPINE SURGERY     lumbar laminectomy    There were no vitals filed for this visit.         ADULT SLP TREATMENT - 12/16/17 1519      General Information   Behavior/Cognition  Alert;Cooperative;Pleasant mood      Treatment Provided   Treatment provided  Cognitive-Linquistic      Pain Assessment   Pain Assessment  No/denies pain      Cognitive-Linquistic Treatment   Treatment focused on  Aphasia;Apraxia    Skilled Treatment  Facilitated non verbal compensations for aphasia utilizing drawing words, movies, songs for her sister to identify. Pt also spontaneously utilized writing and description - her sister guessed all of pt's drawings. Simple naming targeted with cues to use descriptions or related words for dysnomic episodes. Pt spontaneously said "Kristi Willis" when trying for Kristi Willis (Kristi Willis) She named 8 with rare min A. Pt and her sister state that Kristi Willis. Pt encouraged to continue to practice word finding and writing daily. She denies difficulty reading menus. Instructed her to pre-plan and look up menus on line, write down what she wants to read her order to the Kristi Willis.       Assessment / Recommendations / Plan   Plan  Continue with current plan of care      Progression Toward Goals   Progression toward goals  Progressing toward goals       SLP Education - 12/16/17 1527    Education provided  Yes    Education Details  multimodal compensations for aphasia    Person(s) Educated  Patient;Caregiver(s)    Methods  Explanation;Demonstration;Verbal cues    Comprehension  Verbalized understanding;Returned demonstration;Need further instruction       SLP Short Term Goals - 12/16/17 1530      SLP SHORT TERM GOAL #1   Title  Pt will complete mildly complex naming tasks with 80% accuracy and occasional min A    Time  3    Period  Weeks    Status  On-going      SLP SHORT TERM GOAL #2   Title  Pt will complete HEP  for verbal apraxia with occasional min A over 2 sessions    Time  3    Period  Weeks    Status  On-going      SLP SHORT TERM GOAL #3   Title  Pt will utilize compensations for dynomia and verbal apraxia 10/12 sentences in structured task with occaional min A    Time  3    Period  Weeks    Status  On-going      SLP SHORT TERM GOAL #4   Title  Further assessment of reading comprhension and written expression as indicated    Time  3    Period  Weeks    Status  On-going       SLP Long Term Goals - 12/16/17 1531      SLP LONG TERM GOAL #1   Title  Pt will utilize compensations for dynomia/verbal apraxia 3/5 opportunites during simple 15 minute conversation    Time  7    Period  Weeks    Status  On-going      SLP LONG TERM GOAL #2   Title  Pt will utilize multimodal communication to augment verbal expression in  conversation as needed over 2 sessions with occasional min A    Time  7    Period  Weeks    Status  On-going      SLP LONG TERM GOAL #3   Title  Pt will ID and attempt to self correct verbal errors in structured speech tasks with occasional min A    Time  7    Period  Weeks    Status  On-going       Plan - 12/16/17 1527    Clinical Impression Statement  Kristi Willis is reporting Willis speech/language, however significant aphasia and verbal apraxia persist. Multimodal, non linguistic compensations for aphasia are targeted, as well as ongoing language rehabilitiation. Continue skilled ST to maximize communication of Willis safety, independnece and to reduce caregiver burden. Aphasia ID card provided today.    Speech Therapy Frequency  2x / week    Treatment/Interventions  SLP instruction and feedback;Compensatory strategies;Functional tasks;Cognitive reorganization;Internal/external aids;Multimodal communcation approach;Patient/family education;Language facilitation;Environmental controls;Cueing hierarchy    Potential to Achieve Goals  Fair    Potential Considerations  Previous level of function;Severity of impairments    Consulted and Agree with Plan of Care  Patient;Family member/caregiver    Family Member Consulted  sister, Kristi Willis       Patient will benefit from skilled therapeutic intervention in order to improve the following deficits and impairments:   Aphasia  Verbal apraxia    Problem List Patient Active Problem List   Diagnosis Date Noted  . Essential hypertension 10/03/2017  . GAD (generalized anxiety disorder) 07/03/2017  . Uncontrolled type 2 diabetes mellitus with diabetic neuropathy, without long-term current use of insulin ([facility]) 09/27/2016  . Depression 03/29/2016  . Insomnia 07/18/2015  . Vitamin D deficiency 10/11/2013  . Mixed hyperlipidemia 05/27/2013  . Right carotid bruit 03/02/2013  . Degenerative disc disease, lumbar 11/27/2012  . Asthma  11/13/2010  . Hyperlipidemia associated with type 2 diabetes mellitus ([place]) 11/13/2010  . Cancer of skin 11/13/2010  . Diabetic neuropathy ([place]) 11/13/2010  . Wide-complex tachycardia==Atrial Tachycardia 11/13/2010    Khloe Hunkele, Annye Rusk MS, CCC-SLP 12/16/2017, 3:32 PM  [facility] [address] [place], [place], [address] Phone: [phone]   Fax:  [phone]   Name: GILBERTE GORLEY MRN: [id number] Date of Birth:  07/22/1941 

## 2017-12-19 ENCOUNTER — Ambulatory Visit: Payer: Medicare Other | Attending: Neurology | Admitting: Speech Pathology

## 2017-12-19 ENCOUNTER — Encounter: Payer: Self-pay | Admitting: Speech Pathology

## 2017-12-19 DIAGNOSIS — R4701 Aphasia: Secondary | ICD-10-CM

## 2017-12-19 DIAGNOSIS — R482 Apraxia: Secondary | ICD-10-CM

## 2017-12-19 NOTE — Therapy (Signed)
[facility] [address] [place], [place], [address] Phone: [phone]   Fax:  [phone]  Speech Language Pathology Treatment  Patient Details  Name: Kristi Willis MRN: [id number] Date of Birth: 16-Dec-1940 Referring Provider: Dr. Daiva Nakayama   Encounter Date: 12/19/2017  End of Session - 12/19/17 1313    Visit Number  5    Number of Visits  17    Date for SLP Re-Evaluation  01/16/18    SLP Start Time  1108    SLP Stop Time   1150    SLP Time Calculation (min)  42 min    Activity Tolerance  Patient tolerated treatment well       Past Medical History:  Diagnosis Date  . Allergy   . Asthma   . Cancer ([place])    skin  . Diabetes mellitus   . GERD (gastroesophageal reflux disease)    hiatal hernia  . Hypercholesteremia   . Hypertension   . Neuromuscular disorder (HCC)    DM neuropathy  . Neuropathy   . SVT (supraventricular tachycardia) (HCC)     Past Surgical History:  Procedure Laterality Date  . BREAST BIOPSY     left breast biopsy  . LUMBAR LAMINECTOMY  08/10  . SKIN CANCER EXCISION  2005  . SPINE SURGERY     lumbar laminectomy    There were no vitals filed for this visit.  Subjective Assessment - 12/19/17 1306    Subjective  Pt arrived 5 minutes late for session    Patient is accompained by:  Family member daughter    Currently in Pain?  No/denies            ADULT SLP TREATMENT - 12/19/17 1306      General Information   Behavior/Cognition  Alert;Cooperative;Pleasant mood      Treatment Provided   Treatment provided  Cognitive-Linquistic      Cognitive-Linquistic Treatment   Treatment focused on  Aphasia;Apraxia    Skilled Treatment  Introduced pt and daughter to [place] Talk Path app. Reading/comprehension and verbal expression facilitated with mildly complex sentence unscramble on app with extended time and occasional min A for error awareness.  Pt named 5 items for  moderately complex category with semantic cues. Pt demonstrated ability to folllow commands, select restaurant and meal with TalkPath assesment. Introduced Veterinary surgeon to daughter and pt. If PPA diagnosed by MD.       Assessment / Recommendations / [place] with current plan of care      Progression Toward Goals   Progression toward goals  Progressing toward goals       SLP Education - 12/19/17 1311    Education provided  Yes    Education Details  langauge enhancing apps, use of technology/AAC device to augment speech    Person(s) Educated  Patient;Child(ren)    Methods  Explanation;Demonstration;Verbal cues    Comprehension  Verbalized understanding;Returned demonstration;Verbal cues required;Need further instruction       SLP Short Term Goals - 12/19/17 1312      SLP SHORT TERM GOAL #1   Title  Pt will complete mildly complex naming tasks with 80% accuracy and occasional min A    Time  3    Period  Weeks    Status  On-going      SLP SHORT TERM GOAL #2   Title  Pt will complete HEP for verbal apraxia with occasional min A over 2 sessions  Time  3    Period  Weeks    Status  On-going      SLP SHORT TERM GOAL #3   Title  Pt will utilize compensations for dynomia and verbal apraxia 10/12 sentences in structured task with occaional min A    Time  3    Period  Weeks    Status  On-going      SLP SHORT TERM GOAL #4   Title  Further assessment of reading comprhension and written expression as indicated    Time  3    Period  Weeks    Status  On-going       SLP Long Term Goals - 12/19/17 1313      SLP LONG TERM GOAL #1   Title  Pt will utilize compensations for dynomia/verbal apraxia 3/5 opportunites during simple 15 minute conversation    Time  7    Period  Weeks    Status  On-going      SLP LONG TERM GOAL #2   Title  Pt will utilize multimodal communication to augment verbal expression in conversation as needed over 2 sessions with occasional min A     Time  7    Period  Weeks    Status  On-going      SLP LONG TERM GOAL #3   Title  Pt will ID and attempt to self correct verbal errors in structured speech tasks with occasional min A    Time  7    Period  Weeks    Status  On-going       Plan - 12/19/17 1312    Clinical Impression Statement  Kristi Willis is reporting improved speech/language, however significant aphasia and verbal apraxia persist. Multimodal, non linguistic compensations for aphasia are targeted, as well as ongoing language rehabilitiation. Continue skilled ST to maximize communication of improved safety, independnece and to reduce caregiver burden. Aphasia ID card provided today.    Speech Therapy Frequency  2x / week    Treatment/Interventions  SLP instruction and feedback;Compensatory strategies;Functional tasks;Cognitive reorganization;Internal/external aids;Multimodal communcation approach;Patient/family education;Language facilitation;Environmental controls;Cueing hierarchy    Potential to Achieve Goals  Fair    Potential Considerations  Previous level of function;Severity of impairments    Consulted and Agree with Plan of Care  Patient;Family member/caregiver    Family Member Consulted  daughter       Patient will benefit from skilled therapeutic intervention in order to improve the following deficits and impairments:   Aphasia  Verbal apraxia    Problem List Patient Active Problem List   Diagnosis Date Noted  . Essential hypertension 10/03/2017  . GAD (generalized anxiety disorder) 07/03/2017  . Uncontrolled type 2 diabetes mellitus with diabetic neuropathy, without long-term current use of insulin ([facility]) 09/27/2016  . Depression 03/29/2016  . Insomnia 07/18/2015  . Vitamin D deficiency 10/11/2013  . Mixed hyperlipidemia 05/27/2013  . Right carotid bruit 03/02/2013  . Degenerative disc disease, lumbar 11/27/2012  . Asthma 11/13/2010  . Hyperlipidemia associated with type 2 diabetes mellitus ([facility])  11/13/2010  . Cancer of skin 11/13/2010  . Diabetic neuropathy ([place]) 11/13/2010  . Wide-complex tachycardia==Atrial Tachycardia 11/13/2010    Iliyah Bui, Annye Rusk MS, CCC-SLP 12/19/2017, 1:14 PM  [facility] [address] [place], [place], [address] Phone: [phone]   Fax:  [phone]   Name: Kristi Willis MRN: [id number] Date of Birth: May 06, 1941

## 2017-12-23 ENCOUNTER — Ambulatory Visit: Payer: Medicare Other | Admitting: Speech Pathology

## 2017-12-23 DIAGNOSIS — R4701 Aphasia: Secondary | ICD-10-CM | POA: Diagnosis not present

## 2017-12-23 DIAGNOSIS — R482 Apraxia: Secondary | ICD-10-CM

## 2017-12-25 NOTE — Therapy (Signed)
[facility] [address] [place], [place], [address] Phone: [phone]   Fax:  [phone]  Speech Language Pathology Treatment  Patient Details  Name: Kristi Willis MRN: [id number] Date of Birth: May 11, 1941 Referring Provider: Dr. Daiva Nakayama   Encounter Date: 12/23/2017  End of Session - 12/25/17 0834    Visit Number  6    Number of Visits  17    Date for SLP Re-Evaluation  01/16/18    SLP Start Time  7124    SLP Stop Time   1400    SLP Time Calculation (min)  43 min    Activity Tolerance  Patient tolerated treatment well       Past Medical History:  Diagnosis Date  . Allergy   . Asthma   . Cancer ([place])    skin  . Diabetes mellitus   . GERD (gastroesophageal reflux disease)    hiatal hernia  . Hypercholesteremia   . Hypertension   . Neuromuscular disorder (HCC)    DM neuropathy  . Neuropathy   . SVT (supraventricular tachycardia) (HCC)     Past Surgical History:  Procedure Laterality Date  . BREAST BIOPSY     left breast biopsy  . LUMBAR LAMINECTOMY  08/10  . SKIN CANCER EXCISION  2005  . SPINE SURGERY     lumbar laminectomy    There were no vitals filed for this visit.             SLP Short Term Goals - 12/25/17 5809      SLP SHORT TERM GOAL #1   Title  Pt will complete mildly complex naming tasks with 80% accuracy and occasional min A    Time  2    Period  Weeks    Status  On-going      SLP SHORT TERM GOAL #2   Title  Pt will complete HEP for verbal apraxia with occasional min A over 2 sessions    Time  2    Period  Weeks    Status  On-going      SLP SHORT TERM GOAL #3   Title  Pt will utilize compensations for dynomia and verbal apraxia 10/12 sentences in structured task with occaional min A    Time  2    Period  Weeks    Status  On-going      SLP SHORT TERM GOAL #4   Title  Further assessment of reading comprhension and written expression as indicated    Time  2    Period  Weeks    Status  On-going       SLP Long Term Goals - 12/25/17 9833      SLP LONG TERM GOAL #1   Title  Pt will utilize compensations for dynomia/verbal apraxia 3/5 opportunites during simple 15 minute conversation    Time  6    Period  Weeks    Status  On-going      SLP LONG TERM GOAL #2   Title  Pt will utilize multimodal communication to augment verbal expression in conversation as needed over 2 sessions with occasional min A    Time  6    Period  Weeks    Status  On-going      SLP LONG TERM GOAL #3   Title  Pt will ID and attempt to self correct verbal errors in structured speech tasks with occasional min A    Time  6  Period  Weeks    Status  On-going       Plan - 12/25/17 3299    Clinical Impression Statement  Kristi Willis is reporting improved speech/language, however significant aphasia and verbal apraxia persist. Multimodal, non linguistic compensations for aphasia are targeted, as well as ongoing language rehabilitiation. Continue skilled ST to maximize communication of improved safety, independnece and to reduce caregiver burden.        Patient will benefit from skilled therapeutic intervention in order to improve the following deficits and impairments:   Aphasia  Verbal apraxia    Problem List Patient Active Problem List   Diagnosis Date Noted  . Essential hypertension 10/03/2017  . GAD (generalized anxiety disorder) 07/03/2017  . Uncontrolled type 2 diabetes mellitus with diabetic neuropathy, without long-term current use of insulin ([facility] [place]) 09/27/2016  . Depression 03/29/2016  . Insomnia 07/18/2015  . Vitamin D deficiency 10/11/2013  . Mixed hyperlipidemia 05/27/2013  . Right carotid bruit 03/02/2013  . Degenerative disc disease, lumbar 11/27/2012  . Asthma 11/13/2010  . Hyperlipidemia associated with type 2 diabetes mellitus ([place]) 11/13/2010  . Cancer of skin 11/13/2010  . Diabetic neuropathy ([place]) 11/13/2010  . Wide-complex  tachycardia==Atrial Tachycardia 11/13/2010    Ira Dougher, Annye Rusk MS, CCC-SLP 12/25/2017, 8:35 AM  [facility] [address] [place], [place], [address] Phone: [phone]   Fax:  [phone]   Name: Kristi Willis MRN: [id number] Date of Birth: 02-17-1941

## 2017-12-26 ENCOUNTER — Encounter: Payer: Self-pay | Admitting: Speech Pathology

## 2017-12-26 ENCOUNTER — Ambulatory Visit: Payer: Medicare Other | Admitting: Speech Pathology

## 2017-12-26 DIAGNOSIS — R4701 Aphasia: Secondary | ICD-10-CM | POA: Diagnosis not present

## 2017-12-26 DIAGNOSIS — R482 Apraxia: Secondary | ICD-10-CM

## 2017-12-26 NOTE — Patient Instructions (Signed)
  Say each 3x each 3x a day  [place] BY BUTTERFLY   PROPER COPPER COFFEE POT  THREE FREE THROWS  DOUBLE BUBBLE GUM  RED LEATHER YELLOW LEATHER  UNIQUE [place]  LOVELY LEMON LINAMENT  FLASH MESSAGE  RED BULB BLUE BULB

## 2017-12-26 NOTE — Therapy (Signed)
[facility] [address] [place], [place], [address] Phone: [phone]   Fax:  [phone]  Speech Language Pathology Treatment  Patient Details  Name: Kristi Willis MRN: [id number] Date of Birth: 1941-07-11 Referring Provider: Dr. Daiva Nakayama   Encounter Date: 12/26/2017  End of Session - 12/26/17 1331    Visit Number  7    Number of Visits  17    SLP Start Time  3664    SLP Stop Time   1320    SLP Time Calculation (min)  47 min    Activity Tolerance  Patient tolerated treatment well       Past Medical History:  Diagnosis Date  . Allergy   . Asthma   . Cancer ([place])    skin  . Diabetes mellitus   . GERD (gastroesophageal reflux disease)    hiatal hernia  . Hypercholesteremia   . Hypertension   . Neuromuscular disorder (HCC)    DM neuropathy  . Neuropathy   . SVT (supraventricular tachycardia) (HCC)     Past Surgical History:  Procedure Laterality Date  . BREAST BIOPSY     left breast biopsy  . LUMBAR LAMINECTOMY  08/10  . SKIN CANCER EXCISION  2005  . SPINE SURGERY     lumbar laminectomy    There were no vitals filed for this visit.  Subjective Assessment - 12/26/17 1236    Patient is accompained by:  Family member    Currently in Pain?  Yes    Pain Score  3     Pain Location  Arm    Pain Orientation  Left    Pain Descriptors / Indicators  Aching    Pain Type  Acute pain    Pain Radiating Towards  wrist    Pain Onset  1 to 4 weeks ago    Pain Frequency  Intermittent            ADULT SLP TREATMENT - 12/26/17 1237      General Information   Behavior/Cognition  Alert;Cooperative;Pleasant mood      Treatment Provided   Treatment provided  Cognitive-Linquistic      Cognitive-Linquistic Treatment   Treatment focused on  Aphasia;Apraxia    Skilled Treatment  Trained pt in HEP for verbal apraxia and compensations with slow rate.  Pt generated 7-8 items in mildly complex  categories with occasional min A. Written expression at word level with usual min A for error awareness. Facilitated verbal expression with pt generating sentence with given word. Pt required ongoing cues for awareness of aphasic/grammatic errors (ie: I do lipstick on lips")      Assessment / Recommendations / Plan   Plan  Continue with current plan of care      Progression Toward Goals   Progression toward goals  Progressing toward goals         SLP Short Term Goals - 12/26/17 1330      SLP SHORT TERM GOAL #1   Title  Pt will complete mildly complex naming tasks with 80% accuracy and occasional min A    Time  2    Period  Weeks    Status  Achieved      SLP SHORT TERM GOAL #2   Title  Pt will complete HEP for verbal apraxia with occasional min A over 2 sessions    Time  2    Period  Weeks    Status  On-going  SLP SHORT TERM GOAL #3   Title  Pt will utilize compensations for dynomia and verbal apraxia 10/12 sentences in structured task with occaional min A    Time  2    Period  Weeks    Status  On-going      SLP SHORT TERM GOAL #4   Title  Further assessment of reading comprhension and written expression as indicated    Time  2    Period  Weeks    Status  On-going       SLP Long Term Goals - 12/26/17 1331      SLP LONG TERM GOAL #1   Title  Pt will utilize compensations for dynomia/verbal apraxia 3/5 opportunites during simple 15 minute conversation    Time  6    Period  Weeks    Status  On-going      SLP LONG TERM GOAL #2   Title  Pt will utilize multimodal communication to augment verbal expression in conversation as needed over 2 sessions with occasional min A    Time  6    Period  Weeks    Status  On-going      SLP LONG TERM GOAL #3   Title  Pt will ID and attempt to self correct verbal errors in structured speech tasks with occasional min A    Time  6    Period  Weeks    Status  On-going       Plan - 12/26/17 1330    Clinical Impression Statement   [name] is reporting improved speech/language, however significant aphasia and verbal apraxia persist. Multimodal, non linguistic compensations for aphasia are targeted, as well as ongoing language rehabilitiation. Continue skilled ST to maximize communication of improved safety, independnece and to reduce caregiver burden.     Speech Therapy Frequency  2x / week    Treatment/Interventions  SLP instruction and feedback;Compensatory strategies;Functional tasks;Cognitive reorganization;Internal/external aids;Multimodal communcation approach;Patient/family education;Language facilitation;Environmental controls;Cueing hierarchy    Potential to Achieve Goals  Fair    Potential Considerations  Previous level of function;Severity of impairments       Patient will benefit from skilled therapeutic intervention in order to improve the following deficits and impairments:   Aphasia  Verbal apraxia    Problem List Patient Active Problem List   Diagnosis Date Noted  . Essential hypertension 10/03/2017  . GAD (generalized anxiety disorder) 07/03/2017  . Uncontrolled type 2 diabetes mellitus with diabetic neuropathy, without long-term current use of insulin ([place]) 09/27/2016  . Depression 03/29/2016  . Insomnia 07/18/2015  . Vitamin D deficiency 10/11/2013  . Mixed hyperlipidemia 05/27/2013  . Right carotid bruit 03/02/2013  . Degenerative disc disease, lumbar 11/27/2012  . Asthma 11/13/2010  . Hyperlipidemia associated with type 2 diabetes mellitus ([place]) 11/13/2010  . Cancer of skin 11/13/2010  . Diabetic neuropathy ([place]) 11/13/2010  . Wide-complex tachycardia==Atrial Tachycardia 11/13/2010    Tahji , Annye Rusk MS, CCC-SLP 12/26/2017, 1:32 PM  [facility] [address] [place], [place], [address] Phone: [phone]   Fax:  [phone]   Name: Kristi Willis MRN: [id number] Date of Birth: 1941/03/18

## 2017-12-29 ENCOUNTER — Ambulatory Visit: Payer: Medicare Other

## 2017-12-29 DIAGNOSIS — R4701 Aphasia: Secondary | ICD-10-CM

## 2017-12-29 DIAGNOSIS — R482 Apraxia: Secondary | ICD-10-CM

## 2017-12-29 NOTE — Patient Instructions (Signed)
  Please complete the assigned speech therapy homework prior to your next session and return it to the speech therapist at your next visit.  

## 2017-12-29 NOTE — Therapy (Signed)
[facility] [address] [place], [place], [address] Phone: [phone]   Fax:  [phone]  Speech Language Pathology Treatment  Patient Details  Name: Kristi Willis MRN: [id number] Date of Birth: 1940-09-25 Referring Provider: Dr. Daiva Nakayama   Encounter Date: 12/29/2017  End of Session - 12/29/17 1637    Visit Number  8    Number of Visits  17    Date for SLP Re-Evaluation  01/16/18    SLP Start Time  1537    SLP Stop Time   1620    SLP Time Calculation (min)  43 min    Activity Tolerance  Patient tolerated treatment well       Past Medical History:  Diagnosis Date  . Allergy   . Asthma   . Cancer ([place])    skin  . Diabetes mellitus   . GERD (gastroesophageal reflux disease)    hiatal hernia  . Hypercholesteremia   . Hypertension   . Neuromuscular disorder (HCC)    DM neuropathy  . Neuropathy   . SVT (supraventricular tachycardia) (HCC)     Past Surgical History:  Procedure Laterality Date  . BREAST BIOPSY     left breast biopsy  . LUMBAR LAMINECTOMY  08/10  . SKIN CANCER EXCISION  2005  . SPINE SURGERY     lumbar laminectomy    There were no vitals filed for this visit.  Subjective Assessment - 12/29/17 1541    Subjective  "She gave  me - some - homework - and -don't  - - - do it. Don't do it - - all - all the time."    Patient is accompained by:  Family member sister    Currently in Pain?  Yes    Pain Score  3     Pain Location  Wrist    Pain Orientation  Left    Pain Descriptors / Indicators  Aching    Pain Type  Acute pain    Pain Onset  1 to 4 weeks ago    Pain Frequency  Intermittent            ADULT SLP TREATMENT - 12/29/17 1550      General Information   Behavior/Cognition  Alert;Cooperative;Pleasant mood      Treatment Provided   Treatment provided  Cognitive-Linquistic      Cognitive-Linquistic Treatment   Treatment focused on  Aphasia;Apraxia    Skilled  Treatment  SLP reviewed pt's HEP for verbal apraxia. SLP noted pt benefitted from SLP breaking word down into chunks and writing it unlike the original (e.g., "linament = "lynn-a-mint", "negotiate"= "nee-go-she-ate"). Pt req'd mod-max cues usually for reduced rate and correction for apraxic verbalization with HEP. With a crossword puzzle, pt generated the target words with rare min A.       Assessment / Recommendations / Plan   Plan  Continue with current plan of care      Progression Toward Goals   Progression toward goals  Progressing toward goals       SLP Education - 12/29/17 1636    Education provided  Yes    Education Details  slowing speech rate may assist pt in more fluid speech    Person(s) Educated  Caregiver(s);Patient    Methods  Explanation;Demonstration    Comprehension  Verbalized understanding;Returned demonstration;Need further instruction       SLP Short Term Goals - 12/29/17 1638      SLP SHORT TERM GOAL #1  Title  Pt will complete mildly complex naming tasks with 80% accuracy and occasional min A    Time  1    Period  Weeks    Status  Achieved      SLP SHORT TERM GOAL #2   Title  Pt will complete HEP for verbal apraxia with occasional min A over 2 sessions    Time  1    Period  Weeks    Status  On-going      SLP SHORT TERM GOAL #3   Title  Pt will utilize compensations for dynomia and verbal apraxia 10/12 sentences in structured task with occaional min A    Time  1    Period  Weeks    Status  On-going      SLP SHORT TERM GOAL #4   Title  Further assessment of reading comprhension and written expression as indicated    Time  1    Period  Weeks    Status  On-going       SLP Long Term Goals - 12/29/17 1638      SLP LONG TERM GOAL #1   Title  Pt will utilize compensations for dynomia/verbal apraxia 3/5 opportunites during simple 15 minute conversation    Time  5    Period  Weeks    Status  On-going      SLP LONG TERM GOAL #2   Title  Pt will  utilize multimodal communication to augment verbal expression in conversation as needed over 2 sessions with occasional min A    Time  5    Period  Weeks    Status  On-going      SLP LONG TERM GOAL #3   Title  Pt will ID and attempt to self correct verbal errors in structured speech tasks with occasional min A    Time  5    Period  Weeks    Status  On-going       Plan - 12/29/17 1637    Clinical Impression Statement  Significant aphasia and verbal apraxia persist in pt's conversational speech. Pt abandoned utterances today with SLP. Multimodal, non linguistic compensations for aphasia are targeted, as well as ongoing language rehabilitiation. Continue skilled ST to maximize communication of improved safety, independnece and to reduce caregiver burden.     Speech Therapy Frequency  2x / week    Treatment/Interventions  SLP instruction and feedback;Compensatory strategies;Functional tasks;Cognitive reorganization;Internal/external aids;Multimodal communcation approach;Patient/family education;Language facilitation;Environmental controls;Cueing hierarchy    Potential to Achieve Goals  Fair    Potential Considerations  Previous level of function;Severity of impairments       Patient will benefit from skilled therapeutic intervention in order to improve the following deficits and impairments:   Aphasia  Verbal apraxia    Problem List Patient Active Problem List   Diagnosis Date Noted  . Essential hypertension 10/03/2017  . GAD (generalized anxiety disorder) 07/03/2017  . Uncontrolled type 2 diabetes mellitus with diabetic neuropathy, without long-term current use of insulin ([facility]) 09/27/2016  . Depression 03/29/2016  . Insomnia 07/18/2015  . Vitamin D deficiency 10/11/2013  . Mixed hyperlipidemia 05/27/2013  . Right carotid bruit 03/02/2013  . Degenerative disc disease, lumbar 11/27/2012  . Asthma 11/13/2010  . Hyperlipidemia associated with type 2 diabetes mellitus ([facility])  11/13/2010  . Cancer of skin 11/13/2010  . Diabetic neuropathy ([facility]) 11/13/2010  . Wide-complex tachycardia==Atrial Tachycardia 11/13/2010    Tiwana Chavis ,MS, CCC-SLP  12/29/2017, 4:39 PM  [facility] [address] [place], [place], [address] Phone: [phone]   Fax:  [phone]   Name: Kristi Willis MRN: [id number] Date of Birth: 1941-01-17

## 2017-12-30 ENCOUNTER — Ambulatory Visit (INDEPENDENT_AMBULATORY_CARE_PROVIDER_SITE_OTHER): Payer: Medicare Other

## 2017-12-30 ENCOUNTER — Encounter: Payer: Self-pay | Admitting: Family

## 2017-12-30 ENCOUNTER — Ambulatory Visit: Payer: Medicare Other | Admitting: Family

## 2017-12-30 VITALS — BP 134/55 | HR 59 | Temp 97.7°F | Ht 63.0 in | Wt 162.0 lb

## 2017-12-30 DIAGNOSIS — M25532 Pain in left wrist: Secondary | ICD-10-CM

## 2017-12-30 NOTE — Progress Notes (Signed)
   Subjective:    Patient ID: Kristi Willis, female    DOB: 04-Dec-1940, 77 y.o.   MRN: [id number]  Chief Complaint  Patient presents with  . left wrist and arm pain    Wrist Pain   The pain is present in the left wrist. This is a new problem. The current episode started 1 to 4 weeks ago. There has been no history of extremity trauma. The problem occurs intermittently. The problem has been unchanged. The quality of the pain is described as aching. The pain is at a severity of 5/10. The pain is mild. Associated symptoms include an inability to bear weight. Pertinent negatives include no numbness or stiffness. The symptoms are aggravated by lying down. She has tried NSAIDS for the symptoms. The treatment provided mild relief. Family history does not include rheumatoid arthritis.      Review of Systems  Musculoskeletal: Negative for stiffness.  Neurological: Negative for numbness.  All other systems reviewed and are negative.      Objective:   Physical Exam  Constitutional: She is oriented to person, place, and time. She appears well-developed and well-nourished. No distress.  HENT:  Head: Normocephalic and atraumatic.  Eyes: Pupils are equal, round, and reactive to light.  Neck: Normal range of motion. Neck supple. No thyromegaly present.  Cardiovascular: Normal rate, regular rhythm, normal heart sounds and intact distal pulses.  No murmur heard. Pulmonary/Chest: Effort normal and breath sounds normal. No respiratory distress. She has no wheezes.  Abdominal: Soft. Bowel sounds are normal. She exhibits no distension. There is no tenderness.  Musculoskeletal: Normal range of motion. She exhibits edema and tenderness.  Trace swelling in left wrist, no redness noted. Decrease ROM with flexion or extension.   Neurological: She is alert and oriented to person, place, and time. She has normal reflexes. No cranial nerve deficit.  Skin: Skin is warm and dry.  Psychiatric: She has a  normal mood and affect. Her behavior is normal. Judgment and thought content normal.  Vitals reviewed.     BP (!) 134/55   Pulse (!) 59   Temp 97.7 F (36.5 C) (Oral)   Ht 5\' 3"  (1.6 m)   Wt 162 lb (73.5 kg)   BMI 28.70 kg/m      Assessment & Plan:  Othelia was seen today for left wrist and arm pain.  Diagnoses and all orders for this visit:  Left wrist pain -     DG Wrist Complete Left; Future   Rest Continue motrin for pain X-ray to make sure there is no acute cause for pain and swelling. She denies any injury. She does have a hx of arthritis.  RTO if symptoms do not improve or worsen   Evelina Dun, FNP

## 2017-12-30 NOTE — Patient Instructions (Signed)
Wrist Pain, Adult  There are many things that can cause wrist pain. Some common causes include:   An injury to the wrist area, such as a sprain, strain, or fracture.   Overuse of the joint.   A condition that causes increased pressure on a nerve in the wrist (carpal tunnel syndrome).   Wear and tear of the joints that occurs with aging (osteoarthritis).   A variety of other types of arthritis.    Sometimes, the cause of wrist pain is not known. Often, the pain goes away when you follow instructions from your health care provider for relieving pain at home, such as resting or icing the wrist. If your wrist pain continues, it is important to tell your health care provider.  Follow these instructions at home:   Rest the wrist area for at least 48 hours or as long as told by your health care provider.   If a splint or elastic bandage has been applied, use it as told by your health care provider.  ? Remove the splint or bandage only as told by your health care provider.  ? Loosen the splint or bandage if your fingers tingle, become numb, or turn cold or blue.   If directed, apply ice to the injured area.  ? If you have a removable splint or elastic bandage, remove it as told by your health care provider.  ? Put ice in a plastic bag.  ? Place a towel between your skin and the bag or between your splint or bandage and the bag.  ? Leave the ice on for 20 minutes, 2-3 times a day.   Keep your arm raised (elevated) above the level of your heart while you are sitting or lying down.   Take over-the-counter and prescription medicines only as told by your health care provider.   Keep all follow-up visits as told by your health care provider. This is important.  Contact a health care provider if:   You have a sudden sharp pain in the wrist, hand, or arm that is different or new.   The swelling or bruising on your wrist or hand gets worse.   Your skin becomes red, gets a rash, or has open sores.   Your pain does not  get better or it gets worse.  Get help right away if:   You lose feeling in your fingers or hand.   Your fingers turn white, very red, or cold and blue.   You cannot move your fingers.   You have a fever or chills.  This information is not intended to replace advice given to you by your health care provider. Make sure you discuss any questions you have with your health care provider.  Document Released: 05/15/2005 Document Revised: 02/29/2016 Document Reviewed: 02/22/2016  Elsevier Interactive Patient Education  2018 [employer]

## 2018-01-01 ENCOUNTER — Ambulatory Visit: Payer: Medicare Other | Admitting: Speech Pathology

## 2018-01-01 DIAGNOSIS — R482 Apraxia: Secondary | ICD-10-CM

## 2018-01-01 DIAGNOSIS — R4701 Aphasia: Secondary | ICD-10-CM | POA: Diagnosis not present

## 2018-01-01 NOTE — Therapy (Signed)
[facility] [address] [place], [place], [address] Phone: [phone]   Fax:  [phone]  Speech Language Pathology Treatment  Patient Details  Name: Kristi Willis MRN: [id number] Date of Birth: 01-04-1941 Referring Provider: Dr. Daiva Nakayama   Encounter Date: 01/01/2018  End of Session - 01/01/18 1457    Visit Number  9    Number of Visits  17    Date for SLP Re-Evaluation  01/16/18    SLP Start Time  1400    SLP Stop Time   1447    SLP Time Calculation (min)  47 min    Activity Tolerance  Patient tolerated treatment well       Past Medical History:  Diagnosis Date  . Allergy   . Asthma   . Cancer ([facility])    skin  . Diabetes mellitus   . GERD (gastroesophageal reflux disease)    hiatal hernia  . Hypercholesteremia   . Hypertension   . Neuromuscular disorder (HCC)    DM neuropathy  . Neuropathy   . SVT (supraventricular tachycardia) (HCC)     Past Surgical History:  Procedure Laterality Date  . BREAST BIOPSY     left breast biopsy  . LUMBAR LAMINECTOMY  08/10  . SKIN CANCER EXCISION  2005  . SPINE SURGERY     lumbar laminectomy    There were no vitals filed for this visit.  Subjective Assessment - 01/01/18 1407    Subjective  "I had uh done some these things uh I filled out" re: homework    Patient is accompained by:  Family member sister    Currently in Pain?  Yes    Pain Score  5     Pain Location  Wrist    Pain Orientation  Left    Pain Descriptors / Indicators  Aching    Pain Type  Acute pain    Pain Onset  1 to 4 weeks ago    Pain Frequency  Constant    Multiple Pain Sites  No            ADULT SLP TREATMENT - 01/01/18 1415      General Information   Behavior/Cognition  Alert;Cooperative;Pleasant mood      Treatment Provided   Treatment provided  Cognitive-Linquistic      Cognitive-Linquistic Treatment   Treatment focused on  Aphasia;Apraxia    Skilled Treatment   Pt completed HEP for verbal apraxia with supervision cues - pt ID'd and self corrected apraxic errors on HEP with rare min A.  Facilitated verbal expression and grammar describing and inferencig photos of people and daily situations with cues for grammar and to carryover compensations for word finding difficulties. Added rhyming sentences to HEP with mod A.      Assessment / Recommendations / Plan   Plan  Continue with current plan of care      Progression Toward Goals   Progression toward goals  Progressing toward goals         SLP Short Term Goals - 01/01/18 1457      SLP SHORT TERM GOAL #1   Title  Pt will complete mildly complex naming tasks with 80% accuracy and occasional min A    Time  1    Period  Weeks    Status  Achieved      SLP SHORT TERM GOAL #2   Title  Pt will complete HEP for verbal apraxia with occasional min A over  2 sessions    Time  1    Period  Weeks    Status  Achieved      SLP SHORT TERM GOAL #3   Title  Pt will utilize compensations for dynomia and verbal apraxia 10/12 sentences in structured task with occaional min A    Time  1    Period  Weeks    Status  Partially Met      SLP SHORT TERM GOAL #4   Title  Further assessment of reading comprhension and written expression as indicated    Time  1    Period  Weeks    Status  Deferred       SLP Long Term Goals - 01/01/18 1457      SLP LONG TERM GOAL #1   Title  Pt will utilize compensations for dynomia/verbal apraxia 3/5 opportunites during simple 15 minute conversation    Time  5    Period  Weeks    Status  On-going      SLP LONG TERM GOAL #2   Title  Pt will utilize multimodal communication to augment verbal expression in conversation as needed over 2 sessions with occasional min A    Time  5    Period  Weeks    Status  On-going      SLP LONG TERM GOAL #3   Title  Pt will ID and attempt to self correct verbal errors in structured speech tasks with occasional min A    Time  5    Period   Weeks    Status  On-going       Plan - 01/01/18 1456    Clinical Impression Statement  Significant aphasia and verbal apraxia persist in pt's conversational speech. Pt abandoned utterances today with SLP. Multimodal, non linguistic compensations for aphasia are targeted, as well as ongoing language rehabilitiation. Continue skilled ST to maximize communication of improved safety, independnece and to reduce caregiver burden.     Speech Therapy Frequency  2x / week    Treatment/Interventions  SLP instruction and feedback;Compensatory strategies;Functional tasks;Cognitive reorganization;Internal/external aids;Multimodal communcation approach;Patient/family education;Language facilitation;Environmental controls;Cueing hierarchy    Potential to Achieve Goals  Fair    Potential Considerations  Previous level of function;Severity of impairments       Patient will benefit from skilled therapeutic intervention in order to improve the following deficits and impairments:   Aphasia  Verbal apraxia    Problem List Patient Active Problem List   Diagnosis Date Noted  . Essential hypertension 10/03/2017  . GAD (generalized anxiety disorder) 07/03/2017  . Uncontrolled type 2 diabetes mellitus with diabetic neuropathy, without long-term current use of insulin ([facility]) 09/27/2016  . Depression 03/29/2016  . Insomnia 07/18/2015  . Vitamin D deficiency 10/11/2013  . Mixed hyperlipidemia 05/27/2013  . Right carotid bruit 03/02/2013  . Degenerative disc disease, lumbar 11/27/2012  . Asthma 11/13/2010  . Hyperlipidemia associated with type 2 diabetes mellitus ([facility]) 11/13/2010  . Cancer of skin 11/13/2010  . Diabetic neuropathy ([place]) 11/13/2010  . Wide-complex tachycardia==Atrial Tachycardia 11/13/2010    Jem Castro, Annye Rusk MS, CCC-SLP 01/01/2018, 2:58 PM  [facility] [address] [place], [place], [address] Phone: [phone]   Fax:   [phone]   Name: Kristi Willis MRN: [id number] Date of Birth: [birth year]

## 2018-01-05 ENCOUNTER — Other Ambulatory Visit: Payer: Self-pay | Admitting: Family

## 2018-01-05 DIAGNOSIS — Z1231 Encounter for screening mammogram for malignant neoplasm of breast: Secondary | ICD-10-CM

## 2018-01-06 ENCOUNTER — Ambulatory Visit: Payer: Medicare Other | Admitting: Speech Pathology

## 2018-01-06 ENCOUNTER — Encounter: Payer: Self-pay | Admitting: Speech Pathology

## 2018-01-06 DIAGNOSIS — R482 Apraxia: Secondary | ICD-10-CM

## 2018-01-06 DIAGNOSIS — R4701 Aphasia: Secondary | ICD-10-CM | POA: Diagnosis not present

## 2018-01-06 NOTE — Therapy (Signed)
[facility] [address] [place], [place], [address] Phone: [phone]   Fax:  [phone]  Speech Language Pathology Treatment  Patient Details  Name: Kristi Willis MRN: [id number] Date of Birth: 01-25-1941 Referring Provider: Dr. Daiva Nakayama   Encounter Date: 01/06/2018  End of Session - 01/06/18 1211    Visit Number  10    Number of Visits  17    Date for SLP Re-Evaluation  01/16/18    SLP Start Time  0935    SLP Stop Time   1020    SLP Time Calculation (min)  45 min    Activity Tolerance  Patient tolerated treatment well       Past Medical History:  Diagnosis Date  . Allergy   . Asthma   . Cancer ([place])    skin  . Diabetes mellitus   . GERD (gastroesophageal reflux disease)    hiatal hernia  . Hypercholesteremia   . Hypertension   . Neuromuscular disorder (HCC)    DM neuropathy  . Neuropathy   . SVT (supraventricular tachycardia) (HCC)     Past Surgical History:  Procedure Laterality Date  . BREAST BIOPSY     left breast biopsy  . LUMBAR LAMINECTOMY  08/10  . SKIN CANCER EXCISION  2005  . SPINE SURGERY     lumbar laminectomy    There were no vitals filed for this visit.  Subjective Assessment - 01/06/18 0941    Subjective  "Sometime it comes out and sometimes it doesn't"    Patient is accompained by:  Family member    Currently in Pain?  Yes    Pain Score  3     Pain Location  Wrist    Pain Orientation  Left    Pain Descriptors / Indicators  Aching    Pain Type  Acute pain    Pain Radiating Towards  wrist    Pain Onset  1 to 4 weeks ago            ADULT SLP TREATMENT - 01/06/18 0942      General Information   Behavior/Cognition  Alert;Cooperative;Pleasant mood      Treatment Provided   Treatment provided  Cognitive-Linquistic      Cognitive-Linquistic Treatment   Treatment focused on  Aphasia;Apraxia    Skilled Treatment  Pt named 3 items in moderately complex  sequnetial category with rare min A. Verbal expression at conversation level explaining cause/effect of situations with rare min cues for use of compensations in structued naming tasks and conversation. Pt also requried cues to use sentneces, rather than agrammatic phrases.       Assessment / Recommendations / Plan   Plan  Continue with current plan of care      Progression Toward Goals   Progression toward goals  Progressing toward goals         SLP Short Term Goals - 01/06/18 1210      SLP SHORT TERM GOAL #1   Title  Pt will complete mildly complex naming tasks with 80% accuracy and occasional min A    Time  1    Period  Weeks    Status  Achieved      SLP SHORT TERM GOAL #2   Title  Pt will complete HEP for verbal apraxia with occasional min A over 2 sessions    Time  1    Period  Weeks    Status  Achieved  SLP SHORT TERM GOAL #3   Title  Pt will utilize compensations for dynomia and verbal apraxia 10/12 sentences in structured task with occaional min A    Time  1    Period  Weeks    Status  Partially Met      SLP SHORT TERM GOAL #4   Title  Further assessment of reading comprhension and written expression as indicated    Time  1    Period  Weeks    Status  Deferred       SLP Long Term Goals - 01/06/18 1210      SLP LONG TERM GOAL #1   Title  Pt will utilize compensations for dynomia/verbal apraxia 3/5 opportunites during simple 15 minute conversation    Time  4    Period  Weeks    Status  On-going      SLP LONG TERM GOAL #2   Title  Pt will utilize multimodal communication to augment verbal expression in conversation as needed over 2 sessions with occasional min A    Time  4    Period  Weeks    Status  On-going      SLP LONG TERM GOAL #3   Title  Pt will ID and attempt to self correct verbal errors in structured speech tasks with occasional min A    Time  4    Period  Weeks    Status  On-going       Plan - 01/06/18 1208    Clinical Impression  Statement  Pt with improvement in naming and in structured sentence tasks with min A. Aphasia and verbal apraxia persist at conversation level requiring cues to utilize compensations and questioning cues usually for repair/clarification. Continue skilled ST to maximize communication for independence and QOL.     Speech Therapy Frequency  2x / week    Treatment/Interventions  SLP instruction and feedback;Compensatory strategies;Functional tasks;Cognitive reorganization;Internal/external aids;Multimodal communcation approach;Patient/family education;Language facilitation;Environmental controls;Cueing hierarchy    Potential to Achieve Goals  Fair    Potential Considerations  Previous level of function;Severity of impairments    Consulted and Agree with Plan of Care  Patient;Family member/caregiver    Family Member Consulted  daughter       Patient will benefit from skilled therapeutic intervention in order to improve the following deficits and impairments:   Aphasia  Verbal apraxia    Problem List Patient Active Problem List   Diagnosis Date Noted  . Essential hypertension 10/03/2017  . GAD (generalized anxiety disorder) 07/03/2017  . Uncontrolled type 2 diabetes mellitus with diabetic neuropathy, without long-term current use of insulin ([place]) 09/27/2016  . Depression 03/29/2016  . Insomnia 07/18/2015  . Vitamin D deficiency 10/11/2013  . Mixed hyperlipidemia 05/27/2013  . Right carotid bruit 03/02/2013  . Degenerative disc disease, lumbar 11/27/2012  . Asthma 11/13/2010  . Hyperlipidemia associated with type 2 diabetes mellitus ([place]) 11/13/2010  . Cancer of skin 11/13/2010  . Diabetic neuropathy ([place]) 11/13/2010  . Wide-complex tachycardia==Atrial Tachycardia 11/13/2010    Judd Mccubbin, Annye Rusk MS, CCC-SLP 01/06/2018, 12:11 PM  [facility] [address] [place], [place], [address] Phone: [phone]   Fax:   [phone]   Name: NIKHITA MENTZEL MRN: [id number] Date of Birth: [birth year]

## 2018-01-07 ENCOUNTER — Ambulatory Visit (HOSPITAL_COMMUNITY)
Admission: RE | Admit: 2018-01-07 | Discharge: 2018-01-07 | Disposition: A | Discharge status: Home / Self Care | Payer: Medicare Other | Source: Ambulatory Visit | Attending: Family | Admitting: Family

## 2018-01-07 DIAGNOSIS — Z1231 Encounter for screening mammogram for malignant neoplasm of breast: Secondary | ICD-10-CM | POA: Diagnosis not present

## 2018-01-08 ENCOUNTER — Ambulatory Visit: Payer: Medicare Other | Admitting: Speech Pathology

## 2018-01-08 ENCOUNTER — Ambulatory Visit: Payer: Medicare Other

## 2018-01-08 DIAGNOSIS — R482 Apraxia: Secondary | ICD-10-CM

## 2018-01-08 DIAGNOSIS — R4701 Aphasia: Secondary | ICD-10-CM

## 2018-01-08 NOTE — Therapy (Signed)
[facility] [address] [place], [place], [address] Phone: [phone]   Fax:  [phone]  Speech Language Pathology Treatment  Patient Details  Name: Kristi Willis MRN: [id number] Date of Birth: 1940-12-05 Referring Provider: Dr. Daiva Nakayama   Encounter Date: 01/08/2018  End of Session - 01/08/18 1450    Visit Number  11    Number of Visits  17    Date for SLP Re-Evaluation  01/16/18    SLP Start Time  1357    SLP Stop Time   1446    SLP Time Calculation (min)  49 min    Activity Tolerance  Patient tolerated treatment well       Past Medical History:  Diagnosis Date  . Allergy   . Asthma   . Cancer ([place])    skin  . Diabetes mellitus   . GERD (gastroesophageal reflux disease)    hiatal hernia  . Hypercholesteremia   . Hypertension   . Neuromuscular disorder (HCC)    DM neuropathy  . Neuropathy   . SVT (supraventricular tachycardia) (HCC)     Past Surgical History:  Procedure Laterality Date  . BREAST BIOPSY     left breast biopsy  . LUMBAR LAMINECTOMY  08/10  . SKIN CANCER EXCISION  2005  . SPINE SURGERY     lumbar laminectomy    There were no vitals filed for this visit.  Subjective Assessment - 01/08/18 1403    Subjective  "She had a few I had to giver her a clue, but I don't tell her the answer"    Patient is accompained by:  Family member sister    Currently in Pain?  Yes    Pain Score  3     Pain Location  Wrist    Pain Orientation  Left    Pain Descriptors / Indicators  Aching    Pain Type  Acute pain    Pain Radiating Towards  wrist    Pain Onset  1 to 4 weeks ago    Pain Frequency  Constant    Effect of Pain on Daily Activities  none            ADULT SLP TREATMENT - 01/08/18 1405      General Information   Behavior/Cognition  Alert;Cooperative;Pleasant mood      Treatment Provided   Treatment provided  Cognitive-Linquistic      Cognitive-Linquistic  Treatment   Treatment focused on  Aphasia;Apraxia    Skilled Treatment  Facilitated verbal expression and grammar generating sentences with 2 given words with max A, reduced load to 1 given word with usual min A to ID/correct grammar errors. Pt generated sentences with multiple meaning words with extended time and occasional min A for 2nd meaning. Again, min to mod A required for aphasic errors.       Assessment / Recommendations / Plan   Plan  Continue with current plan of care      Progression Toward Goals   Progression toward goals  Progressing toward goals         SLP Short Term Goals - 01/08/18 1449      SLP SHORT TERM GOAL #1   Title  Pt will complete mildly complex naming tasks with 80% accuracy and occasional min A    Time  1    Period  Weeks    Status  Achieved      SLP SHORT TERM GOAL #2   Title  Pt will complete HEP for verbal apraxia with occasional min A over 2 sessions    Time  1    Period  Weeks    Status  Achieved      SLP SHORT TERM GOAL #3   Title  Pt will utilize compensations for dynomia and verbal apraxia 10/12 sentences in structured task with occaional min A    Time  1    Period  Weeks    Status  Partially Met      SLP SHORT TERM GOAL #4   Title  Further assessment of reading comprhension and written expression as indicated    Time  1    Period  Weeks    Status  Deferred       SLP Long Term Goals - 01/08/18 1450      SLP LONG TERM GOAL #1   Title  Pt will utilize compensations for dynomia/verbal apraxia 3/5 opportunites during simple 15 minute conversation    Time  4    Period  Weeks    Status  On-going      SLP LONG TERM GOAL #2   Title  Pt will utilize multimodal communication to augment verbal expression in conversation as needed over 2 sessions with occasional min A    Time  4    Period  Weeks    Status  On-going      SLP LONG TERM GOAL #3   Title  Pt will ID and attempt to self correct verbal errors in structured speech tasks with  occasional min A    Time  4    Period  Weeks    Status  On-going       Plan - 01/08/18 1449    Clinical Impression Statement  Pt with improvement in naming and in structured sentence tasks with min A. Aphasia and verbal apraxia persist at conversation level requiring cues to utilize compensations and questioning cues usually for repair/clarification. Continue skilled ST to maximize communication for independence and QOL.     Speech Therapy Frequency  2x / week    Treatment/Interventions  SLP instruction and feedback;Compensatory strategies;Functional tasks;Cognitive reorganization;Internal/external aids;Multimodal communcation approach;Patient/family education;Language facilitation;Environmental controls;Cueing hierarchy    Potential Considerations  Previous level of function;Severity of impairments    Consulted and Agree with Plan of Care  Patient;Family member/caregiver    Family Member Consulted  daughter       Patient will benefit from skilled therapeutic intervention in order to improve the following deficits and impairments:   Aphasia  Verbal apraxia    Problem List Patient Active Problem List   Diagnosis Date Noted  . Essential hypertension 10/03/2017  . GAD (generalized anxiety disorder) 07/03/2017  . Uncontrolled type 2 diabetes mellitus with diabetic neuropathy, without long-term current use of insulin ([facility]) 09/27/2016  . Depression 03/29/2016  . Insomnia 07/18/2015  . Vitamin D deficiency 10/11/2013  . Mixed hyperlipidemia 05/27/2013  . Right carotid bruit 03/02/2013  . Degenerative disc disease, lumbar 11/27/2012  . Asthma 11/13/2010  . Hyperlipidemia associated with type 2 diabetes mellitus ([facility]) 11/13/2010  . Cancer of skin 11/13/2010  . Diabetic neuropathy ([name]) 11/13/2010  . Wide-complex tachycardia==Atrial Tachycardia 11/13/2010    Lovvorn, Annye Rusk MS, CCC-SLP 01/08/2018, 2:51 PM  [facility] [address] [place], [place], [address] Phone: [phone]   Fax:  [phone]   Name: Kristi Willis MRN: [id number] Date of Birth: May 03, 1941

## 2018-01-10 ENCOUNTER — Other Ambulatory Visit: Payer: Self-pay | Admitting: Family

## 2018-01-13 ENCOUNTER — Ambulatory Visit: Payer: Medicare Other | Admitting: Speech Pathology

## 2018-01-13 ENCOUNTER — Encounter: Payer: Self-pay | Admitting: Speech Pathology

## 2018-01-13 ENCOUNTER — Telehealth: Payer: Self-pay

## 2018-01-13 DIAGNOSIS — R4701 Aphasia: Secondary | ICD-10-CM

## 2018-01-13 DIAGNOSIS — H9190 Unspecified hearing loss, unspecified ear: Secondary | ICD-10-CM

## 2018-01-13 DIAGNOSIS — R482 Apraxia: Secondary | ICD-10-CM

## 2018-01-13 NOTE — Telephone Encounter (Signed)
Neurology said she needs a hearing evaluation

## 2018-01-13 NOTE — Therapy (Signed)
[facility] [address] [place], [place], [address] Phone: [phone]   Fax:  [phone]  Speech Language Pathology Treatment  Patient Details  Name: Kristi Willis MRN: [id number] Date of Birth: 11/07/1940 Referring Provider: Dr. Daiva Nakayama   Encounter Date: 01/13/2018  End of Session - 01/13/18 1556    Visit Number  12    Number of Visits  17    Date for SLP Re-Evaluation  01/16/18    SLP Start Time  4    SLP Stop Time   1446    SLP Time Calculation (min)  44 min    Activity Tolerance  Patient tolerated treatment well       Past Medical History:  Diagnosis Date  . Allergy   . Asthma   . Cancer ([facility])    skin  . Diabetes mellitus   . GERD (gastroesophageal reflux disease)    hiatal hernia  . Hypercholesteremia   . Hypertension   . Neuromuscular disorder (HCC)    DM neuropathy  . Neuropathy   . SVT (supraventricular tachycardia) (HCC)     Past Surgical History:  Procedure Laterality Date  . BREAST BIOPSY     left breast biopsy  . LUMBAR LAMINECTOMY  08/10  . SKIN CANCER EXCISION  2005  . SPINE SURGERY     lumbar laminectomy    There were no vitals filed for this visit.  Subjective Assessment - 01/13/18 1411    Subjective  "I have worked on it" re: HW    Patient is accompained by:  Family member sister Kristi Willis    Currently in Pain?  Yes    Pain Score  3     Pain Location  Wrist    Pain Orientation  Left    Pain Descriptors / Indicators  Aching    Pain Type  Acute pain    Pain Radiating Towards  wrist    Pain Onset  1 to 4 weeks ago    Pain Frequency  Constant    Effect of Pain on Daily Activities  none    Multiple Pain Sites  No            ADULT SLP TREATMENT - 01/13/18 1414      General Information   Behavior/Cognition  Alert;Cooperative;Pleasant mood      Treatment Provided   Treatment provided  Cognitive-Linquistic      Cognitive-Linquistic Treatment   Treatment focused on  Aphasia;Apraxia    Skilled Treatment  Compensations for aphasia and word finding facilitated non verbal and verbal compenstions for aphasia utilizing gestures and words to communicate mid level words/concepts with occasional min questioning cues to prompt pt for salient descriptions. Simple conversation halting, however pt successful in 5 minute conversation re: weekend activities with min questioning cues      Assessment / Recommendations / [place] with current plan of care      Progression Toward Goals   Progression toward goals  Progressing toward goals         SLP Short Term Goals - 01/13/18 1555      SLP SHORT TERM GOAL #1   Title  Pt will complete mildly complex naming tasks with 80% accuracy and occasional min A    Time  1    Period  Weeks    Status  Achieved      SLP SHORT TERM GOAL #2   Title  Pt will complete HEP for verbal  apraxia with occasional min A over 2 sessions    Time  1    Period  Weeks    Status  Achieved      SLP SHORT TERM GOAL #3   Title  Pt will utilize compensations for dynomia and verbal apraxia 10/12 sentences in structured task with occaional min A    Time  1    Period  Weeks    Status  Partially Met      SLP SHORT TERM GOAL #4   Title  Further assessment of reading comprhension and written expression as indicated    Time  1    Period  Weeks    Status  Deferred       SLP Long Term Goals - 01/13/18 1555      SLP LONG TERM GOAL #1   Title  Pt will utilize compensations for dynomia/verbal apraxia 3/5 opportunites during simple 15 minute conversation    Time  3    Period  Weeks    Status  On-going      SLP LONG TERM GOAL #2   Title  Pt will utilize multimodal communication to augment verbal expression in conversation as needed over 2 sessions with occasional min A    Time  3    Period  Weeks    Status  On-going      SLP LONG TERM GOAL #3   Title  Pt will ID and attempt to self correct verbal errors in  structured speech tasks with occasional min A    Time  3    Period  Weeks    Status  On-going       Plan - 01/13/18 1554    Clinical Impression Statement  Pt with improvement in naming and in structured sentence tasks with min A. Aphasia and verbal apraxia persist at conversation level requiring cues to utilize compensations and questioning cues usually for repair/clarification. Continue skilled ST to maximize communication for independence and QOL.     Speech Therapy Frequency  2x / week    Treatment/Interventions  SLP instruction and feedback;Compensatory strategies;Functional tasks;Cognitive reorganization;Internal/external aids;Multimodal communcation approach;Patient/family education;Language facilitation;Environmental controls;Cueing hierarchy    Potential Considerations  Previous level of function;Severity of impairments    Family Member Consulted  sister Kristi Willis       Patient will benefit from skilled therapeutic intervention in order to improve the following deficits and impairments:   Aphasia  Verbal apraxia    Problem List Patient Active Problem List   Diagnosis Date Noted  . Essential hypertension 10/03/2017  . GAD (generalized anxiety disorder) 07/03/2017  . Uncontrolled type 2 diabetes mellitus with diabetic neuropathy, without long-term current use of insulin ([facility]) 09/27/2016  . Depression 03/29/2016  . Insomnia 07/18/2015  . Vitamin D deficiency 10/11/2013  . Mixed hyperlipidemia 05/27/2013  . Right carotid bruit 03/02/2013  . Degenerative disc disease, lumbar 11/27/2012  . Asthma 11/13/2010  . Hyperlipidemia associated with type 2 diabetes mellitus ([place]) 11/13/2010  . Cancer of skin 11/13/2010  . Diabetic neuropathy ([place]) 11/13/2010  . Wide-complex tachycardia==Atrial Tachycardia 11/13/2010    Lovvorn, Annye Rusk MS, CCC-SLP 01/13/2018, 3:58 PM  [facility] [address] [place], [place],  [address] Phone: [phone]   Fax:  [phone]   Name: Kristi Willis MRN: [id number] Date of Birth: 1940-12-29

## 2018-01-15 ENCOUNTER — Encounter: Payer: Self-pay | Admitting: Speech Pathology

## 2018-01-15 ENCOUNTER — Ambulatory Visit: Payer: Medicare Other | Admitting: Speech Pathology

## 2018-01-15 ENCOUNTER — Other Ambulatory Visit: Payer: Self-pay

## 2018-01-15 DIAGNOSIS — R4701 Aphasia: Secondary | ICD-10-CM

## 2018-01-15 DIAGNOSIS — R482 Apraxia: Secondary | ICD-10-CM

## 2018-01-15 NOTE — Telephone Encounter (Signed)
Referral placed for Audiology

## 2018-01-15 NOTE — Therapy (Signed)
[facility] [address] [place], [place], [address] Phone: [phone]   Fax:  [phone]  Speech Language Pathology Treatment  Patient Details  Name: Kristi Willis MRN: [id number] Date of Birth: 1940/10/08 Referring Provider: Dr. Daiva Nakayama   Encounter Date: 01/15/2018  End of Session - 01/15/18 1459    Visit Number  13    Number of Visits  17    Date for SLP Re-Evaluation  01/30/18 extended date for re-eval as ST began 2 weeks after inital eval     SLP Start Time  5945    SLP Stop Time   1445    SLP Time Calculation (min)  42 min    Activity Tolerance  Patient tolerated treatment well       Past Medical History:  Diagnosis Date  . Allergy   . Asthma   . Cancer ([place])    skin  . Diabetes mellitus   . GERD (gastroesophageal reflux disease)    hiatal hernia  . Hypercholesteremia   . Hypertension   . Neuromuscular disorder (HCC)    DM neuropathy  . Neuropathy   . SVT (supraventricular tachycardia) (HCC)     Past Surgical History:  Procedure Laterality Date  . BREAST BIOPSY     left breast biopsy  . LUMBAR LAMINECTOMY  08/10  . SKIN CANCER EXCISION  2005  . SPINE SURGERY     lumbar laminectomy    There were no vitals filed for this visit.  Subjective Assessment - 01/15/18 1406    Subjective  "I have been in [place] getting my hair"    Currently in Pain?  Yes    Pain Score  3     Pain Location  Wrist    Pain Orientation  Left    Pain Descriptors / Indicators  Aching    Pain Type  Acute pain    Pain Onset  1 to 4 weeks ago    Pain Frequency  Constant    Effect of Pain on Daily Activities  none    Multiple Pain Sites  No            ADULT SLP TREATMENT - 01/15/18 1414      General Information   Behavior/Cognition  Alert;Cooperative;Pleasant mood      Treatment Provided   Treatment provided  Cognitive-Linquistic      Cognitive-Linquistic Treatment   Treatment focused on   Aphasia;Apraxia    Skilled Treatment  Verbal expression facilitated naming basic pictures and verbalizing which does not belong out of 4, with ongoing cues for sentence structure. Pt with reduced fluency today, cues required for reduced rate.       Assessment / Recommendations / Plan   Plan  Continue with current plan of care       SLP Education - 01/15/18 1457    Education provided  Yes    Education Details  language activities to continue at home    Person(s) Educated  Patient;Other (comment) sister   sister      SLP Short Term Goals - 01/15/18 1458      SLP SHORT TERM GOAL #1   Title  Pt will complete mildly complex naming tasks with 80% accuracy and occasional min A    Time  1    Period  Weeks    Status  Achieved      SLP SHORT TERM GOAL #2   Title  Pt will complete HEP for verbal apraxia with  occasional min A over 2 sessions    Time  1    Period  Weeks    Status  Achieved      SLP SHORT TERM GOAL #3   Title  Pt will utilize compensations for dynomia and verbal apraxia 10/12 sentences in structured task with occaional min A    Time  1    Period  Weeks    Status  Partially Met      SLP SHORT TERM GOAL #4   Title  Further assessment of reading comprhension and written expression as indicated    Time  1    Period  Weeks    Status  Deferred       SLP Long Term Goals - 01/15/18 1458      SLP LONG TERM GOAL #1   Title  Pt will utilize compensations for dynomia/verbal apraxia 3/5 opportunites during simple 15 minute conversation    Time  3    Period  Weeks    Status  On-going      SLP LONG TERM GOAL #2   Title  Pt will utilize multimodal communication to augment verbal expression in conversation as needed over 2 sessions with occasional min A    Baseline  01/13/18    Time  3    Period  Weeks    Status  On-going      SLP LONG TERM GOAL #3   Title  Pt will ID and attempt to self correct verbal errors in structured speech tasks with occasional min A    Time  3     Period  Weeks    Status  On-going       Plan - 01/15/18 1458    Clinical Impression Statement  Pt with improvement in naming and in structured sentence tasks with min A. Aphasia and verbal apraxia persist at conversation level requiring cues to utilize compensations and questioning cues usually for repair/clarification. Continue skilled ST to maximize communication for independence and QOL.     Speech Therapy Frequency  2x / week    Treatment/Interventions  SLP instruction and feedback;Compensatory strategies;Functional tasks;Cognitive reorganization;Internal/external aids;Multimodal communcation approach;Patient/family education;Language facilitation;Environmental controls;Cueing hierarchy    Potential Considerations  Previous level of function;Severity of impairments       Patient will benefit from skilled therapeutic intervention in order to improve the following deficits and impairments:   Aphasia  Verbal apraxia    Problem List Patient Active Problem List   Diagnosis Date Noted  . Essential hypertension 10/03/2017  . GAD (generalized anxiety disorder) 07/03/2017  . Uncontrolled type 2 diabetes mellitus with diabetic neuropathy, without long-term current use of insulin ([facility]) 09/27/2016  . Depression 03/29/2016  . Insomnia 07/18/2015  . Vitamin D deficiency 10/11/2013  . Mixed hyperlipidemia 05/27/2013  . Right carotid bruit 03/02/2013  . Degenerative disc disease, lumbar 11/27/2012  . Asthma 11/13/2010  . Hyperlipidemia associated with type 2 diabetes mellitus ([place]) 11/13/2010  . Cancer of skin 11/13/2010  . Diabetic neuropathy ([place]) 11/13/2010  . Wide-complex tachycardia==Atrial Tachycardia 11/13/2010    Lovvorn, Annye Rusk MS, CCC-SLP 01/15/2018, 3:01 PM  [facility] [address] [place], [place], [address] Phone: [phone]   Fax:  [phone]   Name: Kristi Willis MRN: [id number] Date of  Birth: 02-13-1941

## 2018-01-15 NOTE — Addendum Note (Signed)
Addended by: Evelina Dun A on: 01/15/2018 11:20 AM   Modules accepted: Orders

## 2018-01-15 NOTE — Patient Instructions (Signed)
  Aphasia Workbooks on [employer] practicing naming items in a given category  Write alphabet and pick a category (ie: fruit) and try to write a word for each letter (as many as you can)  Practice writing sentences

## 2018-01-20 ENCOUNTER — Ambulatory Visit: Payer: Medicare Other | Attending: Neurology | Admitting: Speech Pathology

## 2018-01-20 ENCOUNTER — Encounter: Payer: Self-pay | Admitting: Speech Pathology

## 2018-01-20 DIAGNOSIS — R482 Apraxia: Secondary | ICD-10-CM

## 2018-01-20 DIAGNOSIS — R4701 Aphasia: Secondary | ICD-10-CM

## 2018-01-20 NOTE — Therapy (Signed)
[facility] [address] [place], [place], [address] Phone: [phone]   Fax:  [phone]  Speech Language Pathology Treatment  Patient Details  Name: Kristi Willis MRN: [id number] Date of Birth: 1940-08-21 Referring Provider: Dr. Daiva Nakayama   Encounter Date: 01/20/2018  End of Session - 01/20/18 1500    Visit Number  14    Number of Visits  17    Date for SLP Re-Evaluation  01/30/18    SLP Start Time  1400    SLP Stop Time   1444    SLP Time Calculation (min)  44 min    Activity Tolerance  Patient tolerated treatment well       Past Medical History:  Diagnosis Date  . Allergy   . Asthma   . Cancer ([place])    skin  . Diabetes mellitus   . GERD (gastroesophageal reflux disease)    hiatal hernia  . Hypercholesteremia   . Hypertension   . Neuromuscular disorder (HCC)    DM neuropathy  . Neuropathy   . SVT (supraventricular tachycardia) (HCC)     Past Surgical History:  Procedure Laterality Date  . BREAST BIOPSY     left breast biopsy  . LUMBAR LAMINECTOMY  08/10  . SKIN CANCER EXCISION  2005  . SPINE SURGERY     lumbar laminectomy    There were no vitals filed for this visit.  Subjective Assessment - 01/20/18 1408    Subjective  "She uh on Jepordy - uh she lost - he lost"    Patient is accompained by:  Family member daughter    Currently in Pain?  No/denies            ADULT SLP TREATMENT - 01/20/18 1409      General Information   Behavior/Cognition  Alert;Cooperative;Pleasant mood      Treatment Provided   Treatment provided  Cognitive-Linquistic      Cognitive-Linquistic Treatment   Treatment focused on  Aphasia;Apraxia    Skilled Treatment  Moderately  complex conversation facilitated with news topics and opinions on issues with instructions to use verbal and non verbal compensations for aphasia/verbal apraxia. Pt consistently rerquired extended time to verbalize her  opinions/pros cons, however with time, she expressed her views with occasional min questioning cues from [place]. She utilized gestures on occasion to augment speech.       Assessment / Recommendations / Plan   Plan  Continue with current plan of care      Progression Toward Goals   Progression toward goals  Progressing toward goals       SLP Education - 01/20/18 1455    Education provided  Yes    Education Details  language activities to continue at home upon d/c    Person(s) Educated  Patient;Other (comment) daughter   daughter   Methods  Explanation    Comprehension  Verbalized understanding       SLP Short Term Goals - 01/20/18 1459      SLP SHORT TERM GOAL #1   Title  Pt will complete mildly complex naming tasks with 80% accuracy and occasional min A    Time  1    Period  Weeks    Status  Achieved      SLP SHORT TERM GOAL #2   Title  Pt will complete HEP for verbal apraxia with occasional min A over 2 sessions    Time  1    Period  Weeks  Status  Achieved      SLP SHORT TERM GOAL #3   Title  Pt will utilize compensations for dynomia and verbal apraxia 10/12 sentences in structured task with occaional min A    Time  1    Period  Weeks    Status  Partially Met      SLP SHORT TERM GOAL #4   Title  Further assessment of reading comprhension and written expression as indicated    Time  1    Period  Weeks    Status  Deferred       SLP Long Term Goals - 01/20/18 1459      SLP LONG TERM GOAL #1   Title  Pt will utilize compensations for dynomia/verbal apraxia 3/5 opportunites during simple 15 minute conversation    Baseline  01/20/17;     Time  2    Period  Weeks    Status  On-going      SLP LONG TERM GOAL #2   Title  Pt will utilize multimodal communication to augment verbal expression in conversation as needed over 2 sessions with occasional min A    Baseline  01/13/18; 01/20/18    Time  2    Period  Weeks    Status  Achieved      SLP LONG TERM GOAL #3   Title   Pt will ID and attempt to self correct verbal errors in structured speech tasks with occasional min A    Baseline  01/20/18    Time  2    Period  Weeks    Status  On-going       Plan - 01/20/18 1455    Clinical Impression Statement  Pt utilizing verbal and non verbal compensations for aphasia with min A. Today, in moderately complex conversation re: opinions on current event issues, pt stated her views and rationale with consistent extended time and min questioning cues. She will need to educate family and friends to give her this extra time to communicate. I have recommended audiological evaluation due to r/o hearing impairment vs auditory comprehension impairment (likely both), she is seeing audiologist today. Continue skilled ST to maximize communciation for independence and QOL    Speech Therapy Frequency  2x / week    Treatment/Interventions  SLP instruction and feedback;Compensatory strategies;Functional tasks;Cognitive reorganization;Internal/external aids;Multimodal communcation approach;Patient/family education;Language facilitation;Environmental controls;Cueing hierarchy    Potential to Achieve Goals  Fair    Potential Considerations  Previous level of function;Severity of impairments    Consulted and Agree with Plan of Care  Patient;Family member/caregiver    Family Member Consulted  daughter       Patient will benefit from skilled therapeutic intervention in order to improve the following deficits and impairments:   Aphasia  Verbal apraxia    Problem List Patient Active Problem List   Diagnosis Date Noted  . Essential hypertension 10/03/2017  . GAD (generalized anxiety disorder) 07/03/2017  . Uncontrolled type 2 diabetes mellitus with diabetic neuropathy, without long-term current use of insulin ([facility]) 09/27/2016  . Depression 03/29/2016  . Insomnia 07/18/2015  . Vitamin D deficiency 10/11/2013  . Mixed hyperlipidemia 05/27/2013  . Right carotid bruit 03/02/2013  .  Degenerative disc disease, lumbar 11/27/2012  . Asthma 11/13/2010  . Hyperlipidemia associated with type 2 diabetes mellitus ([facility]) 11/13/2010  . Cancer of skin 11/13/2010  . Diabetic neuropathy ([facility]) 11/13/2010  . Wide-complex tachycardia==Atrial Tachycardia 11/13/2010    Niklaus Mamaril, Annye Rusk MS, CCC-SLP 01/20/2018, 3:00 PM  [facility] [address] [place], [place], [address] Phone: [phone]   Fax:  [phone]   Name: Kristi Willis MRN: [id number] Date of Birth: 03-12-1941

## 2018-01-20 NOTE — Patient Instructions (Signed)
  Primary progressive aphasia is being considered  Kristi Willis has had some difficulty with auditory comprehension due to aphasia. Questioning auditory comprehension vs hearing loss or (likely) some combination  Give her extra time to get her words/sentences out

## 2018-01-22 ENCOUNTER — Ambulatory Visit: Payer: Medicare Other | Admitting: Speech Pathology

## 2018-01-22 ENCOUNTER — Encounter: Payer: Self-pay | Admitting: Speech Pathology

## 2018-01-22 DIAGNOSIS — R4701 Aphasia: Secondary | ICD-10-CM | POA: Diagnosis not present

## 2018-01-22 DIAGNOSIS — R482 Apraxia: Secondary | ICD-10-CM

## 2018-01-22 NOTE — Therapy (Signed)
[facility] [address] [place], [place], [address] Phone: [phone]   Fax:  [phone]  Speech Language Pathology Treatment  Patient Details  Name: Kristi Willis MRN: [id number] Date of Birth: August 27, 1940 Referring Provider: Dr. Daiva Nakayama   Encounter Date: 01/22/2018  End of Session - 01/22/18 1508    Visit Number  15    Number of Visits  17    Date for SLP Re-Evaluation  01/30/18    SLP Start Time  1404    SLP Stop Time   1448    SLP Time Calculation (min)  44 min    Activity Tolerance  Patient tolerated treatment well       Past Medical History:  Diagnosis Date  . Allergy   . Asthma   . Cancer ([facility])    skin  . Diabetes mellitus   . GERD (gastroesophageal reflux disease)    hiatal hernia  . Hypercholesteremia   . Hypertension   . Neuromuscular disorder (HCC)    DM neuropathy  . Neuropathy   . SVT (supraventricular tachycardia) (HCC)     Past Surgical History:  Procedure Laterality Date  . BREAST BIOPSY     left breast biopsy  . LUMBAR LAMINECTOMY  08/10  . SKIN CANCER EXCISION  2005  . SPINE SURGERY     lumbar laminectomy    There were no vitals filed for this visit.  Subjective Assessment - 01/22/18 1411    Subjective  "There uh wrecks - uh 2 wrecks"    Patient is accompained by:  Family member sister Hoyle Sauer    Currently in Pain?  No/denies            ADULT SLP TREATMENT - 01/22/18 1412      General Information   Behavior/Cognition  Alert;Cooperative;Pleasant mood      Treatment Provided   Treatment provided  Cognitive-Linquistic      Cognitive-Linquistic Treatment   Treatment focused on  Aphasia;Apraxia    Skilled Treatment  Verbal expression facilitated in conversations with current issues with cues to utilize gestures and descirptions for word finding episodes. Pt required cues 1x to not look at her sister for the word. Divergent naming with parameters with  extended time and semantic cues      Assessment / Recommendations / Plan   Plan  Continue with current plan of care      Progression Toward Goals   Progression toward goals  Progressing toward goals         SLP Short Term Goals - 01/22/18 1507      SLP SHORT TERM GOAL #1   Title  Pt will complete mildly complex naming tasks with 80% accuracy and occasional min A    Time  1    Period  Weeks    Status  Achieved      SLP SHORT TERM GOAL #2   Title  Pt will complete HEP for verbal apraxia with occasional min A over 2 sessions    Time  1    Period  Weeks    Status  Achieved      SLP SHORT TERM GOAL #3   Title  Pt will utilize compensations for dynomia and verbal apraxia 10/12 sentences in structured task with occaional min A    Time  1    Period  Weeks    Status  Partially Met      SLP SHORT TERM GOAL #4   Title  Further  assessment of reading comprhension and written expression as indicated    Time  1    Period  Weeks    Status  Deferred       SLP Long Term Goals - 01/22/18 1507      SLP LONG TERM GOAL #1   Title  Pt will utilize compensations for dynomia/verbal apraxia 3/5 opportunites during simple 15 minute conversation    Baseline  01/20/17;  01/22/18    Time  2    Period  Weeks    Status  On-going      SLP LONG TERM GOAL #2   Title  Pt will utilize multimodal communication to augment verbal expression in conversation as needed over 2 sessions with occasional min A    Baseline  01/13/18; 01/20/18    Time  2    Period  Weeks    Status  Achieved      SLP LONG TERM GOAL #3   Title  Pt will ID and attempt to self correct verbal errors in structured speech tasks with occasional min A    Baseline  01/20/18; 6/619    Time  2    Period  Weeks    Status  On-going       Plan - 01/22/18 1506    Clinical Impression Statement  Pt utilizing verbal and non verbal compensations for aphasia with min A. Today, in moderately complex conversation re: opinions on current event  issues, pt stated her views and rationale with consistent extended time and min questioning cues. She will need to educate family and friends to give her this extra time to communicate. I have recommended audiological evaluation due to r/o hearing impairment vs auditory comprehension impairment (likely both), she is seeing audiologist today. Continue skilled ST to maximize communciation for independence and QOL    Speech Therapy Frequency  2x / week    Treatment/Interventions  SLP instruction and feedback;Compensatory strategies;Functional tasks;Cognitive reorganization;Internal/external aids;Multimodal communcation approach;Patient/family education;Language facilitation;Environmental controls;Cueing hierarchy    Potential Considerations  Previous level of function;Severity of impairments       Patient will benefit from skilled therapeutic intervention in order to improve the following deficits and impairments:   Aphasia  Verbal apraxia    Problem List Patient Active Problem List   Diagnosis Date Noted  . Essential hypertension 10/03/2017  . GAD (generalized anxiety disorder) 07/03/2017  . Uncontrolled type 2 diabetes mellitus with diabetic neuropathy, without long-term current use of insulin ([facility]) 09/27/2016  . Depression 03/29/2016  . Insomnia 07/18/2015  . Vitamin D deficiency 10/11/2013  . Mixed hyperlipidemia 05/27/2013  . Right carotid bruit 03/02/2013  . Degenerative disc disease, lumbar 11/27/2012  . Asthma 11/13/2010  . Hyperlipidemia associated with type 2 diabetes mellitus ([facility]) 11/13/2010  . Cancer of skin 11/13/2010  . Diabetic neuropathy ([facility]) 11/13/2010  . Wide-complex tachycardia==Atrial Tachycardia 11/13/2010    Lovvorn, Annye Rusk MS, CCC-SLP 01/22/2018, 3:09 PM  [facility] [address] [place], [place], [address] Phone: [phone]   Fax:  [phone]   Name: TAMIE MINTEER MRN:  [id number] Date of Birth: 1941/02/14

## 2018-01-27 ENCOUNTER — Ambulatory Visit: Payer: Medicare Other | Admitting: Speech Pathology

## 2018-01-27 ENCOUNTER — Encounter: Payer: Self-pay | Admitting: Speech Pathology

## 2018-01-27 DIAGNOSIS — R4701 Aphasia: Secondary | ICD-10-CM

## 2018-01-27 DIAGNOSIS — R482 Apraxia: Secondary | ICD-10-CM

## 2018-01-27 NOTE — Patient Instructions (Signed)
  Keep practicing writing sentences and double checking grammar  Keep practicing naming  - Conard Novak, [place], [employer] (no timers)

## 2018-01-27 NOTE — Therapy (Signed)
[facility] [address] [place], [place], [address] Phone: [phone]   Fax:  [phone]  Speech Language Pathology Treatment  Patient Details  Name: Kristi Willis MRN: [id number] Date of Birth: 06-24-1941 Referring Provider: Dr. Daiva Nakayama   Encounter Date: 01/27/2018  End of Session - 01/27/18 1505    Visit Number  16    Number of Visits  17    Date for SLP Re-Evaluation  01/30/18    SLP Start Time  43    SLP Stop Time   1445    SLP Time Calculation (min)  43 min    Activity Tolerance  Patient tolerated treatment well       Past Medical History:  Diagnosis Date  . Allergy   . Asthma   . Cancer ([facility])    skin  . Diabetes mellitus   . GERD (gastroesophageal reflux disease)    hiatal hernia  . Hypercholesteremia   . Hypertension   . Neuromuscular disorder (HCC)    DM neuropathy  . Neuropathy   . SVT (supraventricular tachycardia) (HCC)     Past Surgical History:  Procedure Laterality Date  . BREAST BIOPSY     left breast biopsy  . LUMBAR LAMINECTOMY  08/10  . SKIN CANCER EXCISION  2005  . SPINE SURGERY     lumbar laminectomy    There were no vitals filed for this visit.  Subjective Assessment - 01/27/18 1410    Subjective  "I had a [employer] reunion and I did real good"    Patient is accompained by:  -- sister, Hoyle Sauer    Currently in Pain?  No/denies            ADULT SLP TREATMENT - 01/27/18 1411      General Information   Behavior/Cognition  Alert;Cooperative;Pleasant mood      Treatment Provided   Treatment provided  Cognitive-Linquistic      Cognitive-Linquistic Treatment   Treatment focused on  Aphasia;Apraxia    Skilled Treatment  Simple conversation halting, but functional over 5 minute simple conversation, with 1 request for repair. written expression in simlple category with pt writng/naming 10 items in a simple category with usual mod A for aphasic errors.       Assessment / Recommendations / Plan   Plan  Continue with current plan of care      Progression Toward Goals   Progression toward goals  Progressing toward goals         SLP Short Term Goals - 01/27/18 1503      SLP SHORT TERM GOAL #1   Title  Pt will complete mildly complex naming tasks with 80% accuracy and occasional min A    Time  1    Period  Weeks    Status  Achieved      SLP SHORT TERM GOAL #2   Title  Pt will complete HEP for verbal apraxia with occasional min A over 2 sessions    Time  1    Period  Weeks    Status  Achieved      SLP SHORT TERM GOAL #3   Title  Pt will utilize compensations for dynomia and verbal apraxia 10/12 sentences in structured task with occaional min A    Time  1    Period  Weeks    Status  Partially Met      SLP SHORT TERM GOAL #4   Title  Further assessment of reading comprhension  and written expression as indicated    Time  1    Period  Weeks    Status  Deferred       SLP Long Term Goals - 01/27/18 1504      SLP LONG TERM GOAL #1   Title  Pt will utilize compensations for dynomia/verbal apraxia 3/5 opportunites during simple 15 minute conversation    Baseline  01/20/17;  01/22/18; 01/27/18    Time  1    Period  Weeks    Status  Achieved      SLP LONG TERM GOAL #2   Title  Pt will utilize multimodal communication to augment verbal expression in conversation as needed over 2 sessions with occasional min A    Baseline  01/13/18; 01/20/18    Time  2    Period  Weeks    Status  Achieved      SLP LONG TERM GOAL #3   Title  Pt will ID and attempt to self correct verbal errors in structured speech tasks with occasional min A    Baseline  01/20/18; 6/619    Time  1    Period  Weeks    Status  On-going       Plan - 01/27/18 1501    Clinical Impression Statement  Pt particpating in simple and mildly complex conversations with extended time and occasional questioning cues with success communicating her messages. She required 2 cues to  "describe it" during word finding episodes. Continue skilled ST 1 more session to maximize carryover of compensations for aphasia/verbal apraxia for independence.     Speech Therapy Frequency  2x / week    Treatment/Interventions  SLP instruction and feedback;Compensatory strategies;Functional tasks;Cognitive reorganization;Internal/external aids;Multimodal communcation approach;Patient/family education;Language facilitation;Environmental controls;Cueing hierarchy    Potential Considerations  Previous level of function;Severity of impairments    Consulted and Agree with Plan of Care  Patient;Family member/caregiver    Family Member Consulted  -- sister, Hoyle Sauer       Patient will benefit from skilled therapeutic intervention in order to improve the following deficits and impairments:   Aphasia  Verbal apraxia    Problem List Patient Active Problem List   Diagnosis Date Noted  . Essential hypertension 10/03/2017  . GAD (generalized anxiety disorder) 07/03/2017  . Uncontrolled type 2 diabetes mellitus with diabetic neuropathy, without long-term current use of insulin ([place]) 09/27/2016  . Depression 03/29/2016  . Insomnia 07/18/2015  . Vitamin D deficiency 10/11/2013  . Mixed hyperlipidemia 05/27/2013  . Right carotid bruit 03/02/2013  . Degenerative disc disease, lumbar 11/27/2012  . Asthma 11/13/2010  . Hyperlipidemia associated with type 2 diabetes mellitus ([facility]) 11/13/2010  . Cancer of skin 11/13/2010  . Diabetic neuropathy ([facility]) 11/13/2010  . Wide-complex tachycardia==Atrial Tachycardia 11/13/2010    Adeola Dennen, Annye Rusk MS, CCC-SLP 01/27/2018, 3:24 PM  [facility] [address] [place] [address], [place], [address] Phone: [phone]   Fax:  [phone]   Name: BERTRICE LEDER MRN: [id number] Date of Birth: 1941-07-07

## 2018-01-29 ENCOUNTER — Other Ambulatory Visit: Payer: Self-pay | Admitting: Family

## 2018-01-29 ENCOUNTER — Encounter: Payer: Self-pay | Admitting: Speech Pathology

## 2018-01-29 ENCOUNTER — Other Ambulatory Visit: Payer: Self-pay | Admitting: "Endocrinology

## 2018-01-29 ENCOUNTER — Ambulatory Visit: Payer: Medicare Other | Admitting: Speech Pathology

## 2018-01-29 ENCOUNTER — Encounter: Payer: Medicare Other | Admitting: Speech Pathology

## 2018-01-29 DIAGNOSIS — R4701 Aphasia: Secondary | ICD-10-CM

## 2018-01-29 DIAGNOSIS — R482 Apraxia: Secondary | ICD-10-CM

## 2018-01-29 NOTE — Patient Instructions (Signed)
   Continue practicing Win Loose or Draw, Charades and naming games and talking about news, current events, hot topics   Keep educating friends and family to not finish your sentences and to let you finish talking  Come back and see me if you need to

## 2018-01-30 NOTE — Therapy (Signed)
[facility] [address] [place], [place], [address] Phone: [phone]   Fax:  [phone]  Speech Language Pathology Treatment  Patient Details  Name: Kristi Willis MRN: [id number] Date of Birth: Oct 30, 1940 Referring Provider: Dr. Daiva Nakayama   Encounter Date: 01/29/2018  End of Session - 01/30/18 0722    Visit Number  17    Number of Visits  17    Date for SLP Re-Evaluation  01/30/18    SLP Start Time  1146    SLP Stop Time   1215    SLP Time Calculation (min)  29 min    Activity Tolerance  Patient tolerated treatment well       Past Medical History:  Diagnosis Date  . Allergy   . Asthma   . Cancer ([facility])    skin  . Diabetes mellitus   . GERD (gastroesophageal reflux disease)    hiatal hernia  . Hypercholesteremia   . Hypertension   . Neuromuscular disorder (HCC)    DM neuropathy  . Neuropathy   . SVT (supraventricular tachycardia) (HCC)     Past Surgical History:  Procedure Laterality Date  . BREAST BIOPSY     left breast biopsy  . LUMBAR LAMINECTOMY  08/10  . SKIN CANCER EXCISION  2005  . SPINE SURGERY     lumbar laminectomy    There were no vitals filed for this visit.  Subjective Assessment - 01/29/18 1148    Subjective  "I uh will uh get back to church on Tues - grief share"    Currently in Pain?  No/denies            ADULT SLP TREATMENT - 01/29/18 1149      General Information   Behavior/Cognition  Alert;Cooperative;Pleasant mood      Treatment Provided   Treatment provided  Cognitive-Linquistic      Cognitive-Linquistic Treatment   Treatment focused on  Aphasia;Apraxia    Skilled Treatment  Reviewed activities for pt to practice, including drawing, charades and naming/language games.  Pt participated in 20 minute conversation re: issues in education with extended time for word finding, however she utilized strategies and did not required      Assessment /  Recommendations / Plan   Plan  All goals met      Progression Toward Goals   Progression toward goals  Goals met, education completed, patient discharged from [facility]  Visits from Start of Care: 17  Current functional level related to goals / functional outcomes: See goals below   Remaining deficits: Non fluent aphasia with verbal apraxia   Education / Equipment: Aphasia ed, compensations for aphasia, language activities to do at home Plan: Patient agrees to discharge.  Patient goals were met. Patient is being discharged due to meeting the stated rehab goals.  ?????         SLP Short Term Goals - 01/30/18 7793      SLP SHORT TERM GOAL #1   Title  Pt will complete mildly complex naming tasks with 80% accuracy and occasional min A    Time  1    Period  Weeks    Status  Achieved      SLP SHORT TERM GOAL #2   Title  Pt will complete HEP for verbal apraxia with occasional min A over 2 sessions    Time  1    Period  Weeks  Status  Achieved      SLP SHORT TERM GOAL #3   Title  Pt will utilize compensations for dynomia and verbal apraxia 10/12 sentences in structured task with occaional min A    Time  1    Period  Weeks    Status  Partially Met      SLP SHORT TERM GOAL #4   Title  Further assessment of reading comprhension and written expression as indicated    Time  1    Period  Weeks    Status  Deferred       SLP Long Term Goals - 01/30/18 8721      SLP LONG TERM GOAL #1   Title  Pt will utilize compensations for dynomia/verbal apraxia 3/5 opportunites during simple 15 minute conversation    Baseline  01/20/17;  01/22/18; 01/27/18    Time  1    Period  Weeks    Status  Achieved      SLP LONG TERM GOAL #2   Title  Pt will utilize multimodal communication to augment verbal expression in conversation as needed over 2 sessions with occasional min A    Baseline  01/13/18; 01/20/18    Time  2    Period  Weeks    Status  Achieved       SLP LONG TERM GOAL #3   Title  Pt will ID and attempt to self correct verbal errors in structured speech tasks with occasional min A    Baseline  01/20/18; 6/619    Time  1    Period  Weeks    Status  Achieved       Plan - 01/30/18 0730    Clinical Impression Statement  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 Pt continues to demonstrate use of compensations for aphasia and verbal apraxia successfully in moderately complex conversation with extended time and rare min A. Paraphasias have reduced,with improved awareness and self correction of aphasic errors. Pt has met all goal and education. She verbalizes that she will continue ongoing language activities at home and continue to practice non-verbal compensations for aphasia. Pt has been very motivated and has demonstrated execellent follow up of all strategies and home activities. D/C ST at this time.        Patient will benefit from skilled therapeutic intervention in order to improve the following deficits and impairments:   Aphasia  Verbal apraxia    Problem List Patient Active Problem List   Diagnosis Date Noted  . Essential hypertension 10/03/2017  . GAD (generalized anxiety disorder) 07/03/2017  . Uncontrolled type 2 diabetes mellitus with diabetic neuropathy, without long-term current use of insulin ([facility]) 09/27/2016  . Depression 03/29/2016  . Insomnia 07/18/2015  . Vitamin D deficiency 10/11/2013  . Mixed hyperlipidemia 05/27/2013  . Right carotid bruit 03/02/2013  . Degenerative disc disease, lumbar 11/27/2012  . Asthma 11/13/2010  . Hyperlipidemia associated with type 2 diabetes mellitus ([facility]) 11/13/2010  . Cancer of skin 11/13/2010  . Diabetic neuropathy ([facility]) 11/13/2010  . Wide-complex tachycardia==Atrial Tachycardia 11/13/2010    Kristi Willis, Annye Rusk MS, CCC-SLP 01/30/2018, 7:31 AM  [facility] [address] [place], [place],  [address] Phone: [phone]   Fax:  [phone]   Name: Kristi Willis MRN: [id number] Date of Birth: [birth year]

## 2018-02-04 ENCOUNTER — Ambulatory Visit: Payer: Medicare Other | Admitting: Nurse Practitioner

## 2018-02-04 ENCOUNTER — Encounter: Payer: Self-pay | Admitting: Nurse Practitioner

## 2018-02-04 VITALS — BP 169/59 | HR 59 | Temp 98.1°F | Ht 63.0 in | Wt 160.8 lb

## 2018-02-04 DIAGNOSIS — W540XXA Bitten by dog, initial encounter: Secondary | ICD-10-CM

## 2018-02-04 DIAGNOSIS — S81852A Open bite, left lower leg, initial encounter: Secondary | ICD-10-CM | POA: Diagnosis not present

## 2018-02-04 MED ORDER — CEPHALEXIN 500 MG PO CAPS: 500.0000 mg | ORAL_CAPSULE | Freq: Two times a day (BID) | ORAL | 0 refills | Status: DC

## 2018-02-04 NOTE — Patient Instructions (Signed)
Animal Bite Animal bite wounds can get infected. It is important to get proper medical treatment. Ask your doctor if you need rabies treatment. Follow these instructions at home: Wound care  Follow instructions from your doctor about how to take care of your wound. Make sure you: ? Wash your hands with soap and water before you change your bandage (dressing). If you cannot use soap and water, use hand sanitizer. ? Change your bandage as told by your doctor. ? Leave stitches (sutures), skin glue, or skin tape (adhesive) strips in place. They may need to stay in place for 2 weeks or longer. If tape strips get loose and curl up, you may trim the loose edges. Do not remove tape strips completely unless your doctor says it is okay.  Check your wound every day for signs of infection. Watch for: ? Redness, swelling, or pain that gets worse. ? Fluid, blood, or pus. General instructions  Take or apply over-the-counter and prescription medicines only as told by your doctor.  If you were prescribed an antibiotic, take or apply it as told by your doctor. Do not stop using the antibiotic even if your condition improves.  Keep the injured area raised (elevated) above the level of your heart while you are sitting or lying down.  If directed, apply ice to the injured area. ? Put ice in a plastic bag. ? Place a towel between your skin and the bag. ? Leave the ice on for 20 minutes, 2-3 times per day.  Keep all follow-up visits as told by your doctor. This is important. Contact a doctor if:  You have redness, swelling, or pain that gets worse.  You have a general feeling of sickness (malaise).  You feel sick to your stomach (nauseous).  You throw up (vomit).  You have pain that does not get better. Get help right away if:  You have a red streak going away from your wound.  You have fluid, blood, or pus coming from your wound.  You have a fever or chills.  You have trouble moving your  injured area.  You have numbness or tingling anywhere on your body. This information is not intended to replace advice given to you by your health care provider. Make sure you discuss any questions you have with your health care provider. Document Released: 08/05/2005 Document Revised: 01/11/2016 Document Reviewed: 12/21/2014 Elsevier Interactive Patient Education  2018 Elsevier Inc.  

## 2018-02-04 NOTE — Progress Notes (Signed)
   Subjective:    Patient ID: Kristi Willis, female    DOB: 1940-10-14, 77 y.o.   MRN: [id number]   Chief Complaint: Animal Bite (Left calf- 3:30pm today. Patient states it was family memeber's dog.)   HPI Patient was bitten by her cousins dog. The dog has all of its shots.    Review of Systems  Constitutional: Negative.   Respiratory: Negative.   Cardiovascular: Negative.   Skin:       Dog bite left lower leg  Neurological: Negative.   Psychiatric/Behavioral: Negative.   All other systems reviewed and are negative.      Objective:   Physical Exam  Constitutional: She appears well-developed and well-nourished. She appears distressed (mild).  Cardiovascular: Normal rate.  Pulmonary/Chest: Effort normal.  Skin: Skin is warm.  2cm bite mark to left lower leg  Psychiatric: She has a normal mood and affect. Her behavior is normal.      BP (!) 169/59   Pulse (!) 59   Temp 98.1 F (36.7 C) (Oral)   Ht 5\' 3"  (1.6 m)   Wt 160 lb 12.8 oz (72.9 kg)   BMI 28.48 kg/m         Assessment & Plan:  Kristi Willis in today with chief complaint of Animal Bite (Left calf- 3:30pm today. Patient states it was family memeber's dog.)   1. Dog bite of left lower leg, initial encounter Keep clean and dry Watch for infection  RTO prn - cephALEXin (KEFLEX) 500 MG capsule; Take 1 capsule (500 mg total) by mouth 2 (two) times daily.  Dispense: 10 capsule; Refill: 0  Mary-Margaret Hassell Done, FNP

## 2018-02-05 ENCOUNTER — Ambulatory Visit: Payer: Medicare Other | Admitting: Psychology

## 2018-02-05 ENCOUNTER — Ambulatory Visit (INDEPENDENT_AMBULATORY_CARE_PROVIDER_SITE_OTHER): Payer: Medicare Other | Admitting: Psychology

## 2018-02-05 ENCOUNTER — Encounter: Payer: Self-pay | Admitting: Psychology

## 2018-02-05 ENCOUNTER — Telehealth: Payer: Self-pay | Admitting: Neurology

## 2018-02-05 DIAGNOSIS — R4701 Aphasia: Secondary | ICD-10-CM

## 2018-02-05 DIAGNOSIS — G3184 Mild cognitive impairment, so stated: Secondary | ICD-10-CM | POA: Diagnosis not present

## 2018-02-05 DIAGNOSIS — R4789 Other speech disturbances: Secondary | ICD-10-CM

## 2018-02-05 NOTE — Progress Notes (Signed)
NEUROBEHAVIORAL STATUS EXAM   Name: Kristi Willis Date of Birth: [birth year] Date of Interview: 02/05/2018  Reason for Referral:  Kristi Willis is a 77 y.o. female who is referred for neuropsychological re-evaluation by Dr. Ellouise Newer of [facility] due to concerns about worsening speech difficulties in the context of previously diagnosed MCI. This patient is accompanied in the office by her daughter and sister who supplement the history.  History of Presenting Problem [07/09/2016]:  [name] and her daughter reported that the patient has had significant word finding and communication difficulty in the past few years. They reported gradual onset with significant worsening after her husband passed away in May 20, 2015. She was started on Cymbalta some time after her husband passed away, and this did seem to help her communication difficulties somewhat but she continues to struggle. She is uncomfortable speaking with other people due to the difficulties. Her daughter also reported that in conversation she seems to often miss the first part and only process the last part.  Upon direct questioning, the patient and her daughter reported the following:  Forgetting recent conversations/events: Occasional Repeating statements/questions: Occasional Misplacing/losing items: No Forgetting appointments or other obligations: Alittle more confused about appointments but not significantly impaired Forgetting to take medications: Yes, some difficulty. She uses a Primary school teacher but then puts the pillsin her pocket or out on the counter (because if she takes them all together she gets upset stomach)and decides when to take them - so sometimes sheforgets to take them  Difficulty concentrating: Good with reading, but other things-"always been bad about that" Starting but not finishing tasks: No Distracted easily: No Processing information more slowly: Yes  Word-finding  difficulty: Yes Word substitutions: Yes Writing difficulty: Yes "once in a while" Spelling difficulty: Yes "once in a while"  Comprehension difficulty: "Once in a while" per patient. [name] says she does have this problem. Unclear if it is due to hearing or processing. It is more of an issue on the phone. The patient has not had an audiology evaluation. No reading comprehension difficulty - loves to read.  Getting lost when driving: No Making wrong turns when driving: No Uncertain about directions when driving or passenger: No  Her daughter notes that a while back she fell asleep when driving to [place], which is 30-40 min from her home, so her sister accompanies her now if she is driving 30 min or more. Otherwise her daughter doesn't have any concerns about her driving.   The patient was seen by Dr. Delice Lesch on 06/10/2016. Her neurological exam was non-focal with a note of length dependent neuropathy. She scored 24/30 on the [facility].   An MRI of the brain completed on 04/17/2016 reportedly showed mild diffuse volume loss but no acute changes.  There is no known family history of dementia. Her mother had schizophrenia. Her father was an alcoholic. She denied childhood history of abuse.  The patient reported a history of depression when her parents passed away and her daughter moved to college. She saw a psychologist for about a year at that time. This was very helpful. She has not had anycounseling since then; she did not have grief counseling after her husband passed. A local [employer] has offered a counseling program to her but she has not gone. She denies history of hallucinations or suicidal ideation/intention.  The patient lives alone. She independently manages all instrumental ADLs without any reported difficulty, other than previously described forgetfulness with medications. Physically, the patient  reported that she has some difficulty with walking and balance but she has not  had any falls. She has diabetes and reported that this is not well controlled. She does not check her blood sugars regularly. She is upset that Cymbalta is making her gain weight. She has taken amitriptyline in the past and would like to be on it again because it did not make her gain weight, but her physician was reportedly concerned about anticholinergic effects of this medication causing confusion/memory loss. [place] [facility] [place] [facility] [place] [facility] She does think Cymbalta has helped her mood, neuropathic pain and speech impairment. She does have difficulty sleeping with frequent awakening. The patient reported significant psychosocial stress surrounding her grandson (daughter's son) who has substance abuse problems and who has demanded that she give him all her deceased husband's belongings. He is not letting her see her great-grandson unless she does this.   Results of Previous Neuropsychological Evaluation [report date 08/20/2016]: Clinical Impressions: Mild cognitive impairment (executive functioning). Grief-related depression. Results of the current evaluation revealed variable memory abilities and executive dysfunction. Her performance across memory tests was unusual in that encoding and retrieval were variable (at times intact, at times impaired). There was not a consistent pattern of memory/hippocampal consolidation dysfunction. However, she did demonstrate executive dysfunction across multiple tasks (including those of mental flexibility, auditory attention/working memory, and phonemic verbal fluency), and as such it may be executive dysfunction that is leading to functional memory difficulties. The patient's test results are NOT consistent with Alzheimer's disease at this time. Additionally, despite apparent observed speech difficulties, there was no evidence of impairment in semantic retrieval or comprehension on formal testing. As such, an underlying primary progressive aphasia is  not considered likely at this time.   Overall, the patient's test results are not consistent with a diagnosis of dementia but do suggest mild cognitive impairment. This could be vascular-related, given her uncontrolled diabetes. Additionally, the patient has significant grief-related depression and current psychosocial stress which is very likely contributing to her cognitive difficulties in daily life.   Interim History and Current Functioning: Ms. Reeb saw Dr. Delice Lesch for neurology follow-up visit on 09/17/2017. She and her daughter reported continued trouble with speech. MMSE was stable at 29/30. She was referred to SLP. 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 She completed 17 speech therapy treatment sessions and was discharged from [facility] on 01/29/2018. She also had an audiology evaluation on 01/20/2018, report available for my review. Audiologic test results showed mid hearing loss on the right side and moderate sensorineural hearing loss on the left side. Due to asymmetry, ENT consult was recommended to rule out retrcocochlear pathology. Amplification was also recommended in the left ear, pending medical clearance.   At today's visit (02/05/2018), the patient's sister and daughter reported observing significant ongoing speech difficulties over the past year and a half. Sometimes they can understand what she is saying but other times it is very difficult. She continues to demonstrate word finding difficulty. She is no longer reading books. She is able to read simple material like the obituaries in the newspaper. They feel as though her auditory comprehension is reduced as well, but it is difficult to know if this is due to hearing loss or processing/comprehension problem. She is able to write out checks and write simple words/phrases but she cannot write out her thoughts in sentences.   They do  feel speech therapy was helpful. However, they noted that the speech therapist informed them they should have received an aphasia diagnosis from our original evaluation. I explained that while we were ruling out primary progressive aphasia, her neurocognitive testing results from the last evaluation did not clearly demonstrate that diagnosis. I explained that the current evaluation and having two data points across time will assist in this manner. They would also like a repeat brain MRI.   They do not notice any other cognitive changes aside from those involving speech and language. She is not demonstrating any significant forgetfulness, attention deficit or visual spatial problems.  The patient continues to live alone and manage all instrumental ADLs. There are no problems with driving, management of finances, appointments or medications. She continues to demonstrate some difficulty with balance and walking. Still no falls. She has neuropathy in her feet and will sometimes walk out of her shoes without realizing it. One day she walked out of her shoe in the [facility] office and did not realize it until she got home. She had to return to the [facility] office to get it. However, she denies any trouble with driving and sensing the gas/brake pedals in her car.   Her mood is stable. She is participating in grief counseling. She does not appear depressed to her family. They note that she is more reserved and quiet in social settings; this appears due to aphasia.   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 Retired at age 12, but continued to work part-time at the day care until her husband passed away. Marital history: Widowed (was married 37 years). 1 Daughter.  Alcohol/Tobacco/Substances: No alcohol. Never a smoker. No substance abuse.    Medical History: Past Medical History:  Diagnosis Date  . Allergy   . Asthma   . Cancer ([place])     skin  . Diabetes mellitus   . GERD (gastroesophageal reflux disease)    hiatal hernia  . Hypercholesteremia   . Hypertension   . Neuromuscular disorder ([facility])    DM neuropathy  . Neuropathy   . SVT (supraventricular tachycardia) ([facility])      Current Medications:  Outpatient Encounter Medications as of 02/05/2018  Medication Sig  . ACCU-CHEK AVIVA PLUS test strip CHECK BLOOD SUGAR UP TO 4 TIMES A DAY OR AS DIRECTED  . ACCU-CHEK SOFTCLIX LANCETS lancets CHECK BLOOD SUGAR UP TO 4 TIMES A DAY OR AS DIRECTED  . acyclovir ointment (ZOVIRAX) 5 % Apply 1 application topically every 3 (three) hours. Around mouth, Until symptoms clear  . albuterol (VENTOLIN HFA) 108 (90 BASE) MCG/ACT inhaler Inhale 2 puffs into the lungs every 6 (six) hours as needed for wheezing.  Marland Kitchen amLODipine (NORVASC) 5 MG tablet TAKE 1 TABLET ONCE DAILY  . aspirin 81 MG chewable tablet Chew 81 mg by mouth daily.  Marland Kitchen azelastine (ASTELIN) 0.1 % nasal spray Place 2 sprays into both nostrils 2 (two) times daily.  . Blood Glucose Monitoring Suppl (ACCU-CHEK AVIVA PLUS) w/Device KIT CHECK BLOOD SUGAR UP TO 4 TIMES A DAY  . Calcium Carbonate-Vitamin D (CALTRATE 600+D) 600-400 MG-UNIT per tablet Take 1 tablet by mouth 2 (two) times daily.  . cephALEXin (KEFLEX) 500 MG capsule Take 1 capsule (500 mg total) by mouth 2 (two) times daily.  Marland Kitchen doxazosin (CARDURA) 8 MG tablet TAKE (1) TABLET DAILY FOR HIGH BLOOD PRESSURE.  Marland Kitchen EPIPEN 2-PAK 0.3 MG/0.3ML SOAJ injection Reported on 01/24/2016  . escitalopram (LEXAPRO)  20 MG tablet Take 1 tablet (20 mg total) by mouth daily.  Marland Kitchen gabapentin (NEURONTIN) 100 MG capsule Take 1 capsule (100 mg total) 3 (three) times daily by mouth.  Marland Kitchen ketoconazole (NIZORAL) 2 % cream Apply 1 application topically daily.  Marland Kitchen lisinopril (PRINIVIL,ZESTRIL) 40 MG tablet TAKE 1 TABLET DAILY  . loratadine (CLARITIN) 10 MG tablet Take 1 tablet (10 mg total) by mouth daily.  . metFORMIN (GLUCOPHAGE) 1000 MG tablet Take 1 tablet  (1,000 mg total) by mouth 2 (two) times daily with a meal.  . metoprolol succinate (TOPROL-XL) 100 MG 24 hr tablet TAKE (1) TABLET TWICE A DAY.  . montelukast (SINGULAIR) 10 MG tablet Take 1 tablet (10 mg total) by mouth at bedtime.  . Multiple Vitamin (MULTIVITAMIN) tablet Take 1 tablet by mouth daily.    . Omega-3 Fatty Acids (FISH OIL) 1200 MG CAPS Take by mouth.  . Pyridoxine HCl (VITAMIN B-6 PO) Take 1 tablet by mouth 2 (two) times daily. Reported on 01/24/2016  . ranitidine (ZANTAC) 150 MG tablet Take 150 mg by mouth 2 (two) times daily.  . rosuvastatin (CRESTOR) 10 MG tablet TAKE 1 TABLET DAILY  . sitaGLIPtin (JANUVIA) 50 MG tablet Take 1 tablet (50 mg total) by mouth daily.  . vitamin B-12 (CYANOCOBALAMIN) 500 MCG tablet Take 500 mcg by mouth daily.    . Vitamin D, Ergocalciferol, (DRISDOL) 50000 units CAPS capsule TAKE 1 CAPSULE ONCE A WEEK   No facility-administered encounter medications on file as of 02/05/2018.      Behavioral Observations:   Appearance: Appropriately dressedand groomed Gait:Ambulated independently,no gross abnormalities observed Speech: Nonfluent and telegraphic with frequent stammering, paraphasic errors, significant word finding difficulty. Speech example: "Uh sometimes uh little on the phone but like uh sometimes I can talk on the phone and sometimes it uh uh little worse I reckon." Thought process: Appears linear Affect: Full, bright, euthymic Interpersonal: Very pleasant, appropriate   60 minutes spent face-to-face with patient completing neurobehavioral status exam. 40 minutes spent integrating medical records/clinical data and completing this report. [id number] unit; [id number] unit.   TESTING: There is medical necessity to proceed with neuropsychological assessment as the results will be used to aid in differential diagnosis and clinical decision-making and to inform specific treatment recommendations. Per the patient, her family and medical records  reviewed, there has been a change in cognitive functioning and worsening of aphasia since her initial evaluation, with a reasonable suspicion of primary progressive aphasia.  Clinical Decision Making: In considering the patient's current level of functioning, level of presumed impairment, nature of symptoms, emotional and behavioral responses during the interview, level of literacy, and observed level of motivation, a battery of tests was selected and communicated to the psychometrician.   Following the clinical interview/neurobehavioral status exam, the patient completed this full battery of neuropsychological testing with my psychometrician under my supervision (see separate note).   PLAN: The patient will return to see me for a follow-up session at which time her test performances and my impressions and treatment recommendations will be reviewed in detail.  Evaluation ongoing; full report to follow.

## 2018-02-05 NOTE — Progress Notes (Signed)
   Neuropsychology Note  Kristi Willis completed 90 minutes of neuropsychological testing with technician, Milana Kidney, BS, under the supervision of Dr. Macarthur Critchley, Licensed Psychologist. The patient did not appear overtly distressed by the testing session, per behavioral observation or via self-report to the technician. Rest breaks were offered.   Clinical Decision Making: In considering the patient's current level of functioning, level of presumed impairment, nature of symptoms, emotional and behavioral responses during the interview, level of literacy, and observed level of motivation/effort, a battery of tests was selected and communicated to the psychometrician.  Communication between the psychologist and technician was ongoing throughout the testing session and changes were made as deemed necessary based on patient performance on testing, technician observations and additional pertinent factors such as those listed above.  Kristi Willis will return within approximately 2 weeks for an interactive feedback session with Dr. Si Raider at which time her test performances, clinical impressions and treatment recommendations will be reviewed in detail. The patient understands she can contact our office should she require our assistance before this time.  35 minutes spent performing neuropsychological evaluation services/clinical decision making (psychologist). [CPT 90383] 33 minutes spent face-to-face with patient administering standardized tests, 30 minutes spent scoring (technician). [CPT [id number], 83291]  Full report to follow.

## 2018-02-05 NOTE — Telephone Encounter (Signed)
A user error has taken place: ERROR °

## 2018-02-17 ENCOUNTER — Other Ambulatory Visit: Payer: Self-pay | Admitting: "Endocrinology

## 2018-02-17 ENCOUNTER — Other Ambulatory Visit: Payer: Medicare Other

## 2018-02-17 DIAGNOSIS — E1165 Type 2 diabetes mellitus with hyperglycemia: Secondary | ICD-10-CM

## 2018-02-18 LAB — COMPREHENSIVE METABOLIC PANEL
A/G RATIO: 1.8 (ref 1.2–2.2)
ALT: 22 IU/L (ref 0–32)
AST: 30 IU/L (ref 0–40)
Albumin: 4.3 g/dL (ref 3.5–4.8)
Alkaline Phosphatase: 55 IU/L (ref 39–117)
BUN/Creatinine Ratio: 18 (ref 12–28)
BUN: 10 mg/dL (ref 8–27)
Bilirubin Total: 0.4 mg/dL (ref 0.0–1.2)
CO2: 23 mmol/L (ref 20–29)
CREATININE: 0.55 mg/dL — AB (ref 0.57–1.00)
Calcium: 9.2 mg/dL (ref 8.7–10.3)
Chloride: 100 mmol/L (ref 96–106)
GFR calc non Af Amer: 91 mL/min/{1.73_m2} (ref >59)
GFR, EST AFRICAN AMERICAN: 105 mL/min/{1.73_m2} (ref >59)
Globulin, Total: 2.4 g/dL (ref 1.5–4.5)
Glucose: 153 mg/dL — ABNORMAL HIGH (ref 65–99)
POTASSIUM: 4.7 mmol/L (ref 3.5–5.2)
Sodium: 139 mmol/L (ref 134–144)
TOTAL PROTEIN: 6.7 g/dL (ref 6.0–8.5)

## 2018-02-18 LAB — HEMOGLOBIN A1C
ESTIMATED AVERAGE GLUCOSE: 157 mg/dL
Hgb A1c MFr Bld: 7.1 % — ABNORMAL HIGH (ref 4.8–5.6)

## 2018-02-23 ENCOUNTER — Encounter: Payer: Self-pay | Admitting: "Endocrinology

## 2018-02-23 ENCOUNTER — Ambulatory Visit: Payer: Medicare Other | Admitting: "Endocrinology

## 2018-02-23 VITALS — BP 129/66 | HR 71 | Ht 63.0 in | Wt 158.0 lb

## 2018-02-23 DIAGNOSIS — E1165 Type 2 diabetes mellitus with hyperglycemia: Secondary | ICD-10-CM

## 2018-02-23 DIAGNOSIS — E1140 Type 2 diabetes mellitus with diabetic neuropathy, unspecified: Secondary | ICD-10-CM | POA: Diagnosis not present

## 2018-02-23 DIAGNOSIS — IMO0002 Reserved for concepts with insufficient information to code with codable children: Secondary | ICD-10-CM

## 2018-02-23 DIAGNOSIS — E782 Mixed hyperlipidemia: Secondary | ICD-10-CM | POA: Diagnosis not present

## 2018-02-23 DIAGNOSIS — I10 Essential (primary) hypertension: Secondary | ICD-10-CM | POA: Diagnosis not present

## 2018-02-23 NOTE — Patient Instructions (Signed)

## 2018-02-23 NOTE — Progress Notes (Signed)
Subjective:    Patient ID: Kristi Willis, female    DOB: 1941/06/21. Patient is being seen in f/u  for management of diabetes requested by  Sharion Balloon, FNP  Past Medical History:  Diagnosis Date  . Allergy   . Asthma   . Cancer ([place])    skin  . Diabetes mellitus   . GERD (gastroesophageal reflux disease)    hiatal hernia  . Hypercholesteremia   . Hypertension   . Neuromuscular disorder (HCC)    DM neuropathy  . Neuropathy   . SVT (supraventricular tachycardia) (HCC)    Past Surgical History:  Procedure Laterality Date  . BREAST BIOPSY     left breast biopsy  . LUMBAR LAMINECTOMY  08/10  . SKIN CANCER EXCISION  2005  . SPINE SURGERY     lumbar laminectomy   Social History   Socioeconomic History  . Marital status: Widowed    Spouse name: Not on file  . Number of children: Not on file  . Years of education: Not on file  . Highest education level: Not on file  Occupational History  . Occupation: Retired from [employer] but still works part-time  Social Needs  . Financial resource strain: Not on file  . Food insecurity:    Worry: Not on file    Inability: Not on file  . Transportation needs:    Medical: Not on file    Non-medical: Not on file  Tobacco Use  . Smoking status: Never Smoker  . Smokeless tobacco: Never Used  Substance and Sexual Activity  . Alcohol use: No  . Drug use: No  . Sexual activity: Never  Lifestyle  . Physical activity:    Days per week: Not on file    Minutes per session: Not on file  . Stress: Not on file  Relationships  . Social connections:    Talks on phone: Not on file    Gets together: Not on file    Attends religious service: Not on file    Active member of club or organization: Not on file    Attends meetings of clubs or organizations: Not on file    Relationship status: Not on file  Other Topics Concern  . Not on file  Social History Narrative   Lives with husband, who is a patient of Dr Lovena Le. She is  active around the house and exercises regularly.   Outpatient Encounter Medications as of 02/23/2018  Medication Sig  . ACCU-CHEK AVIVA PLUS test strip CHECK BLOOD SUGAR UP TO 4 TIMES A DAY OR AS DIRECTED  . ACCU-CHEK SOFTCLIX LANCETS lancets CHECK BLOOD SUGAR UP TO 4 TIMES A DAY OR AS DIRECTED  . acyclovir ointment (ZOVIRAX) 5 % Apply 1 application topically every 3 (three) hours. Around mouth, Until symptoms clear  . albuterol (VENTOLIN HFA) 108 (90 BASE) MCG/ACT inhaler Inhale 2 puffs into the lungs every 6 (six) hours as needed for wheezing.  Marland Kitchen amLODipine (NORVASC) 5 MG tablet TAKE 1 TABLET ONCE DAILY  . aspirin 81 MG chewable tablet Chew 81 mg by mouth daily.  Marland Kitchen azelastine (ASTELIN) 0.1 % nasal spray Place 2 sprays into both nostrils 2 (two) times daily.  . Blood Glucose Monitoring Suppl (ACCU-CHEK AVIVA PLUS) w/Device KIT CHECK BLOOD SUGAR UP TO 4 TIMES A DAY  . Calcium Carbonate-Vitamin D (CALTRATE 600+D) 600-400 MG-UNIT per tablet Take 1 tablet by mouth 2 (two) times daily.  . cephALEXin (KEFLEX) 500 MG capsule Take 1  capsule (500 mg total) by mouth 2 (two) times daily.  Marland Kitchen doxazosin (CARDURA) 8 MG tablet TAKE (1) TABLET DAILY FOR HIGH BLOOD PRESSURE.  Marland Kitchen EPIPEN 2-PAK 0.3 MG/0.3ML SOAJ injection Reported on 01/24/2016  . escitalopram (LEXAPRO) 20 MG tablet Take 1 tablet (20 mg total) by mouth daily.  Marland Kitchen gabapentin (NEURONTIN) 100 MG capsule Take 1 capsule (100 mg total) 3 (three) times daily by mouth.  Marland Kitchen ketoconazole (NIZORAL) 2 % cream Apply 1 application topically daily.  Marland Kitchen lisinopril (PRINIVIL,ZESTRIL) 40 MG tablet TAKE 1 TABLET DAILY  . loratadine (CLARITIN) 10 MG tablet Take 1 tablet (10 mg total) by mouth daily.  . metFORMIN (GLUCOPHAGE) 1000 MG tablet Take 1 tablet (1,000 mg total) by mouth 2 (two) times daily with a meal.  . metoprolol succinate (TOPROL-XL) 100 MG 24 hr tablet TAKE (1) TABLET TWICE A DAY.  . montelukast (SINGULAIR) 10 MG tablet Take 1 tablet (10 mg total) by mouth at  bedtime.  . Multiple Vitamin (MULTIVITAMIN) tablet Take 1 tablet by mouth daily.    . Omega-3 Fatty Acids (FISH OIL) 1200 MG CAPS Take by mouth.  . Pyridoxine HCl (VITAMIN B-6 PO) Take 1 tablet by mouth 2 (two) times daily. Reported on 01/24/2016  . ranitidine (ZANTAC) 150 MG tablet Take 150 mg by mouth 2 (two) times daily.  . rosuvastatin (CRESTOR) 10 MG tablet TAKE 1 TABLET DAILY  . sitaGLIPtin (JANUVIA) 50 MG tablet Take 1 tablet (50 mg total) by mouth daily.  . vitamin B-12 (CYANOCOBALAMIN) 500 MCG tablet Take 500 mcg by mouth daily.    . Vitamin D, Ergocalciferol, (DRISDOL) 50000 units CAPS capsule TAKE 1 CAPSULE ONCE A WEEK   No facility-administered encounter medications on file as of 02/23/2018.    ALLERGIES: Allergies  Allergen Reactions  . Dimetapp C [Phenylephrine-Bromphen-Codeine] Itching  . Phenylephrine Hcl Other (See Comments)    unknown  . Ultram [Tramadol Hcl] Other (See Comments)    Insomnia  . Celebrex [Celecoxib] Rash  . Cortisone Rash  . Fenofibrate Rash  . Sulfa Antibiotics Rash   VACCINATION STATUS: Immunization History  Administered Date(s) Administered  . Influenza, High Dose Seasonal PF 06/13/2016, 07/03/2017  . Influenza,inj,Quad PF,6+ Mos 05/20/2013, 05/31/2014, 07/18/2015  . Influenza-Unspecified 08/20/2011  . Pneumococcal Conjugate-13 05/31/2014  . Pneumococcal Polysaccharide-23 07/22/2012  . Tdap 02/02/2015    Diabetes  She presents for her follow-up diabetic visit. She has type 2 diabetes mellitus. Onset time: She was diagnosed at approximate age of 1 years. Her disease course has been stable. There are no hypoglycemic associated symptoms. Pertinent negatives for hypoglycemia include no confusion, headaches, pallor or seizures. Associated symptoms include fatigue and foot paresthesias. Pertinent negatives for diabetes include no chest pain, no polydipsia, no polyphagia and no polyuria. There are no hypoglycemic complications. Symptoms are stable.  Diabetic complications include peripheral neuropathy. Risk factors for coronary artery disease include diabetes mellitus, dyslipidemia, hypertension and sedentary lifestyle. Current diabetic treatments: Metformin 1000 mg by mouth twice a day, Januvia 50 mg p.o. daily. Her weight is decreasing steadily. She is following a generally unhealthy diet. When asked about meal planning, she reported none. She has not had a previous visit with a dietitian. She rarely participates in exercise. An ACE inhibitor/angiotensin II receptor blocker is being taken. She sees a podiatrist.Eye exam is current.  Hyperlipidemia  This is a chronic problem. The current episode started more than 1 year ago. The problem is controlled. Recent lipid tests were reviewed and are high. Exacerbating diseases include diabetes.  Pertinent negatives include no chest pain, myalgias or shortness of breath. Current antihyperlipidemic treatment includes statins. Risk factors for coronary artery disease include dyslipidemia, diabetes mellitus, hypertension and a sedentary lifestyle.  Hypertension  This is a chronic problem. The current episode started more than 1 year ago. The problem is controlled. Pertinent negatives include no chest pain, headaches, palpitations or shortness of breath. Risk factors for coronary artery disease include diabetes mellitus, dyslipidemia, sedentary lifestyle and family history. Past treatments include ACE inhibitors and beta blockers.    Review of Systems  Constitutional: Positive for fatigue. Negative for chills, fever and unexpected weight change.  HENT: Negative for trouble swallowing and voice change.   Eyes: Negative for visual disturbance.  Respiratory: Negative for cough, shortness of breath and wheezing.   Cardiovascular: Negative for chest pain, palpitations and leg swelling.  Gastrointestinal: Negative for diarrhea, nausea and vomiting.  Endocrine: Negative for cold intolerance, heat intolerance,  polydipsia, polyphagia and polyuria.  Musculoskeletal: Negative for arthralgias and myalgias.  Skin: Negative for color change, pallor, rash and wound.  Neurological: Negative for seizures and headaches.  Psychiatric/Behavioral: Negative for confusion and suicidal ideas.    Objective:    BP 129/66   Pulse 71   Ht _0  (1.6 m)   Wt 158 lb (71.7 kg)   BMI 27.99 kg/m   Wt Readings from Last 3 Encounters:  02/23/18 158 lb (71.7 kg)  02/04/18 160 lb 12.8 oz (72.9 kg)  12/30/17 162 lb (73.5 kg)    Physical Exam  Constitutional: She is oriented to person, place, and time. She appears well-developed.  HENT:  Head: Normocephalic and atraumatic.  Eyes: EOM are normal.  Neck: Normal range of motion. Neck supple. No tracheal deviation present. No thyromegaly present.  Pulmonary/Chest: Effort normal.  Abdominal: There is no tenderness. There is no guarding.  Musculoskeletal: Normal range of motion. She exhibits no edema.  Neurological: She is alert and oriented to person, place, and time. No cranial nerve deficit. Coordination normal.  Skin: Skin is warm and dry. No rash noted. No erythema. No pallor.  Psychiatric: She has a normal mood and affect. Judgment normal.    CMP     Component Value Date/Time   NA 139 02/17/2018 0915   K 4.7 02/17/2018 0915   CL 100 02/17/2018 0915   CO2 23 02/17/2018 0915   GLUCOSE 153 (H) 02/17/2018 0915   GLUCOSE 187 (H) 02/04/2016 1934   BUN 10 02/17/2018 0915   CREATININE 0.55 (L) 02/17/2018 0915   CREATININE 0.54 03/02/2013 0931   CALCIUM 9.2 02/17/2018 0915   PROT 6.7 02/17/2018 0915   ALBUMIN 4.3 02/17/2018 0915   AST 30 02/17/2018 0915   ALT 22 02/17/2018 0915   ALKPHOS 55 02/17/2018 0915   BILITOT 0.4 02/17/2018 0915   GFRNONAA 91 02/17/2018 0915   GFRNONAA >89 03/02/2013 0931   GFRAA 105 02/17/2018 0915   GFRAA >89 03/02/2013 0931     Diabetic Labs (most recent): Lab Results  Component Value Date   HGBA1C 7.1 (H) 02/17/2018    HGBA1C 7.1 10/03/2017   HGBA1C 7.9 (H) 03/10/2017     Lipid Panel ( most recent) Lipid Panel     Component Value Date/Time   CHOL 171 10/03/2017 1038   CHOL 177 02/23/2013 0911   TRIG 114 10/03/2017 1038   TRIG 203 (H) 01/12/2015 0915   TRIG 293 (H) 02/23/2013 0911   HDL 46 10/03/2017 1038   HDL 40 01/12/2015 0915   HDL 48  02/23/2013 0911   CHOLHDL 3.7 10/03/2017 1038   LDLCALC 102 (H) 10/03/2017 1038   LDLCALC 119 (H) 10/06/2013 1012   LDLCALC 70 02/23/2013 0911      Assessment & Plan:   1. Uncontrolled type 2 diabetes mellitus with diabetic neuropathy, without long-term current use of insulin ([place])   - Patient has currently uncontrolled symptomatic type 2 DM since  77 years of age. -She came with stable A1c of 7.1%, generally improving from 9.3%.   Recent labs reviewed, showing normal renal function.   Her diabetes is complicated by referral neuropathy and patient remains at a high risk for more acute and chronic complications of diabetes which include CAD, CVA, CKD, retinopathy, and neuropathy. These are all discussed in detail with the patient.  - I have counseled the patient on diet management and weight loss, by adopting a carbohydrate restricted/protein rich diet.  -  Suggestion is made for her to avoid simple carbohydrates  from her diet including Cakes, Sweet Desserts / Pastries, Ice Cream, Soda (diet and regular), Sweet Tea, Candies, Chips, Cookies, Store Bought Juices, Alcohol in Excess of  1-2 drinks a day, Artificial Sweeteners, and "Sugar-free" Products. This will help patient to have stable blood glucose profile and potentially avoid unintended weight gain.   - I encouraged the patient to switch to  unprocessed or minimally processed complex starch and increased protein intake (animal or plant source), fruits, and vegetables.  - Patient is advised to stick to a routine mealtimes to eat 3 meals  a day and avoid unnecessary snacks ( to snack only to correct  hypoglycemia).   - I have approached patient with the following individualized plan to manage diabetes and patient agrees:   -She maintained reasonable control of diabetes.  -Based on her presentation with controlled A1c of 7.1% on metformin and Januvia, she will not require insulin treatment at this time. - I advised her to continue metformin 1000 mg by mouth twice a day, therapeutically suitable for patient. - I will continue Januvia 50 mg  by mouth every morning with breakfast.  Side effects and precautions discussed with her.  - Patient specific target  A1c;  LDL, HDL, Triglycerides, and  Waist Circumference were discussed in detail.  2) BP/HTN: Her blood pressure is controlled to target.  I advised her to continue her current blood pressure medications including lisinopril 40 mg p.o. daily, metoprolol, and amlodipine.    3) Lipids/HPL: Uncontrolled but improving, LDL 102.    Patient is advised tocontinue rosuvastatin 10 mg p.o. nightly.   4)  Weight/Diet: CDE Consult has been initiated , exercise, and detailed carbohydrates information provided.  5) Chronic Care/Health Maintenance:  -Patient is on ACEI/ARB and Statin medications and encouraged to continue to follow up with Ophthalmology, Podiatrist at least yearly or according to recommendations, and advised to  stay away from smoking. I have recommended yearly flu vaccine and pneumonia vaccination at least every 5 years; moderate intensity exercise for up to 150 minutes weekly; and  sleep for at least 7 hours a day.  - I advised patient to maintain close follow up with Sharion Balloon, FNP for primary care needs.  - Time spent with the patient: 25 min, of which >50% was spent in reviewing her  current and  previous labs, previous treatments, and medications doses and developing a plan for long-term care.  Kristi Willis participated in the discussions, expressed understanding, and voiced agreement with the above plans.  All  questions were  answered to her satisfaction. she is encouraged to contact clinic should she have any questions or concerns prior to her return visit.  Follow up plan: - Return in about 4 months (around 06/26/2018) for follow up with pre-visit labs.  Glade Lloyd, MD Phone: [phone]  Fax: [phone]  -  This note was partially dictated with voice recognition software. Similar sounding words can be transcribed inadequately or may not  be corrected upon review.  02/23/2018, 2:34 PM

## 2018-02-26 ENCOUNTER — Other Ambulatory Visit: Payer: Self-pay | Admitting: Family

## 2018-03-02 NOTE — Progress Notes (Signed)
  NEUROPSYCHOLOGICAL EVALUATION   Name:    Kristi Willis  Date of Birth:   05/15/1941 Date of Interview:  02/05/2018 Date of Testing:  02/05/2018   Date of Feedback:  03/03/2018       Background Information:  Reason for Referral:  Kristi Willis is a 77 y.o. female referred by Dr. Karen Aquino to assess her current level of cognitive functioning and assist in differential diagnosis. The current evaluation consisted of a review of available medical records, an interview with the patient and her daughter and sister, and the completion of a neuropsychological testing battery. Informed consent was obtained.  History of Presenting Problem [07/09/2016]:  [name] and her daughter reported that the patient has had significant word finding and communication difficulty in the past few years. They reported gradual onset with significant worsening after her husband passed away in Sept 2016. She was started on Cymbalta some time after her husband passed away, and this did seem to help her communication difficulties somewhat but she continues to struggle. She is uncomfortable speaking with other people due to the difficulties. Her daughter also reported that in conversation she seems to often miss the first part and only process the last part.  Upon direct questioning, the patient and her daughter reported the following:  Forgetting recent conversations/events: Occasional Repeating statements/questions: Occasional Misplacing/losing items: No Forgetting appointments or other obligations: Alittle more confused about appointments but not significantly impaired Forgetting to take medications: Yes, some difficulty. She uses a weekly organizer but then puts the pillsin her pocket or out on the counter (because if she takes them all together she gets upset stomach)and decides when to take them - so sometimes sheforgets to take them  Difficulty concentrating: Good with reading, but other  things-"always been bad about that" Starting but not finishing tasks: No Distracted easily: No Processing information more slowly: Yes  Word-finding difficulty: Yes Word substitutions: Yes Writing difficulty: Yes "once in a while" Spelling difficulty: Yes "once in a while"  Comprehension difficulty: "Once in a while" per patient. Jenny says she does have this problem. Unclear if it is due to hearing or processing. It is more of an issue on the phone. The patient has not had an audiology evaluation. No reading comprehension difficulty - loves to read.  Getting lost when driving: No Making wrong turns when driving: No Uncertain about directions when driving or passenger: No  Her daughter notes that a while back she fell asleep when driving to [place], which is 30-40 min from her home, so her sister accompanies her now if she is driving 30 min or more. Otherwise her daughter doesn't have any concerns about her driving.   The patient was seen by Dr. Aquino on 06/10/2016. Her neurological exam was non-focal with a note of length dependent neuropathy. She scored 24/30 on the MOCA.   An MRI of the brain completed on 8/30/2017reportedlyshowed mild diffuse volume loss but no acute changes.  There is no known family history of dementia. Her mother had schizophrenia. Her father was an alcoholic. She denied childhood history of abuse.  The patient reported a history of depression when her parents passed away and her daughter moved to college. She saw a psychologist for about a year at that time. This was very helpful. She has not had anycounseling since then; she did not havegrief counseling after her husband passed. A local [employer] [facility] has offered a counseling program to her but she has not gone. She denies   history of hallucinations or suicidal ideation/intention.  The patient lives alone. She independently manages all instrumental ADLs without any reported difficulty, other than  previously described forgetfulness with medications. Physically, the patient reported that she has some difficulty with walking and balance but she has not had any falls. She has diabetes and reported that this is not well controlled. She does not check her blood sugars regularly. She is upset that Cymbalta is making her gain weight. She has taken amitriptyline in the past and would like to be on it again because it did not make her gain weight, but her physician was reportedly concerned about anticholinergic effects of this medication causing confusion/memory loss. [place] [facility] [place] [facility] She does think Cymbalta has helped her mood, neuropathic pain and speech impairment. She does have difficulty sleeping with frequent awakening. The patient reported significant psychosocial stress surrounding her grandson (daughter's son) who has substance abuse problems and who has demanded that she give him all her deceased husband's belongings. He is not letting her see her great-grandson unless she does this.   Results of Previous Neuropsychological Evaluation [report date 08/20/2016]: Clinical Impressions:Mild cognitive impairment (executive functioning). Grief-related depression. Results of the current evaluation revealed variable memory abilities and executive dysfunction. Her performance across memory tests was unusual in that encoding and retrieval were variable (at times intact, at times impaired). There was not a consistent pattern of memory/hippocampal consolidation dysfunction. However, she did demonstrate executive dysfunction across multiple tasks (including those of mental flexibility, auditory attention/working memory, and phonemic verbal fluency), and as such it may be executive dysfunction that is leading to functional memory difficulties. The patient's test results are NOT consistent with Alzheimer's disease at this time. Additionally,despite apparent observed speech  difficulties,there was no evidence of impairment in semantic retrieval or comprehension on formal testing.As such, an underlying primary progressive aphasia is not considered likely at this time. Overall, the patient's test results are not consistent with a diagnosis of dementia but do suggest mild cognitive impairment. This could be vascular-related, given her uncontrolled diabetes. Additionally, the patient has significant grief-related depression and current psychosocial stress which is very likely contributing to her cognitive difficulties in daily life.   Interim History and Current Functioning: Ms. Favela saw Dr. Delice Lesch for neurology follow-up visit on 09/17/2017. She and her daughter reported continued trouble with speech. MMSE was stable at 29/30. She was referred to SLP. 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 She completed 17 speech therapy treatment sessions and was discharged from [facility] on 01/29/2018. She also had an audiology evaluation on 01/20/2018, report available for my review. Audiologic test results showed mild hearing loss on the right side and moderate sensorineural hearing loss on the left side. Due to asymmetry, ENT consult was recommended to rule out retrcocochlear pathology. Amplification was also recommended in the left ear, pending medical clearance.   At today's visit (02/05/2018), the patient's sister and daughter reported observing significant ongoing speech difficulties over the past year and a half. Sometimes they can understand what she is saying but other times it is very difficult. She continues to demonstrate word finding difficulty. She is no longer reading books. She is able to read simple material like the obituaries in the newspaper. They feel as though her auditory comprehension is reduced as well, but it is difficult to know if this is due to hearing loss  or processing/comprehension  problem. She is able to write out checks and write simple words/phrases but she cannot write out her thoughts in sentences.   They do feel speech therapy was helpful. However, they noted that the speech therapist informed them they should have received an aphasia diagnosis from our original evaluation. I explained that while we were ruling out primary progressive aphasia, her neurocognitive testing results from the last evaluation did not clearly demonstrate that diagnosis. I explained that the current evaluation and having two data points across time will assist in this manner. They would also like a repeat brain MRI.   They do not notice any other cognitive changes aside from those involving speech and language. She is not demonstrating any significant forgetfulness, attention deficit or visual spatial problems.  The patient continues to live alone and manage all instrumental ADLs. There are no problems with driving, management of finances, appointments or medications. She continues to demonstrate some difficulty with balance and walking. Still no falls. She has neuropathy in her feet and will sometimes walk out of her shoes without realizing it. One day she walked out of her shoe in the [facility] office and did not realize it until she got home. She had to return to the [facility] office to get it. However, she denies any trouble with driving and sensing the gas/brake pedals in her car.   Her mood is stable. She is participating in grief counseling. She does not appear depressed to her family. They note that she is more reserved and quiet in social settings; this appears due to aphasia.   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 Retired at age 55, but continued to work part-time at the day care until her husband passed away. Marital history: Widowed (was married 75 years). 1 Daughter.    Alcohol/Tobacco/Substances: No alcohol. Never a smoker. No substance abuse.   Medical History:  Past Medical History:  Diagnosis Date  . Allergy   . Asthma   . Cancer ([place])    skin  . Diabetes mellitus   . GERD (gastroesophageal reflux disease)    hiatal hernia  . Hypercholesteremia   . Hypertension   . Neuromuscular disorder (HCC)    DM neuropathy  . Neuropathy   . SVT (supraventricular tachycardia) ([place])     Current medications:  Outpatient Encounter Medications as of 03/03/2018  Medication Sig  . ACCU-CHEK AVIVA PLUS test strip CHECK BLOOD SUGAR UP TO 4 TIMES A DAY OR AS DIRECTED  . ACCU-CHEK SOFTCLIX LANCETS lancets CHECK BLOOD SUGAR UP TO 4 TIMES A DAY OR AS DIRECTED  . acyclovir ointment (ZOVIRAX) 5 % Apply 1 application topically every 3 (three) hours. Around mouth, Until symptoms clear  . albuterol (VENTOLIN HFA) 108 (90 BASE) MCG/ACT inhaler Inhale 2 puffs into the lungs every 6 (six) hours as needed for wheezing.  Marland Kitchen amLODipine (NORVASC) 5 MG tablet TAKE 1 TABLET ONCE DAILY  . aspirin 81 MG chewable tablet Chew 81 mg by mouth daily.  Marland Kitchen azelastine (ASTELIN) 0.1 % nasal spray Place 2 sprays into both nostrils 2 (two) times daily.  . Blood Glucose Monitoring Suppl (ACCU-CHEK AVIVA PLUS) w/Device KIT CHECK BLOOD SUGAR UP TO 4 TIMES A DAY  . Calcium Carbonate-Vitamin D (CALTRATE 600+D) 600-400 MG-UNIT per tablet Take 1 tablet by mouth 2 (two) times daily.  . cephALEXin (KEFLEX) 500 MG capsule Take 1 capsule (500 mg total) by mouth 2 (two) times daily.  Marland Kitchen doxazosin (CARDURA) 8  MG tablet TAKE (1) TABLET DAILY FOR HIGH BLOOD PRESSURE.  . EPIPEN 2-PAK 0.3 MG/0.3ML SOAJ injection Reported on 01/24/2016  . escitalopram (LEXAPRO) 20 MG tablet Take 1 tablet (20 mg total) by mouth daily.  . gabapentin (NEURONTIN) 100 MG capsule Take 1 capsule (100 mg total) 3 (three) times daily by mouth.  . ketoconazole (NIZORAL) 2 % cream Apply 1 application topically daily.  . lisinopril  (PRINIVIL,ZESTRIL) 40 MG tablet TAKE 1 TABLET DAILY  . loratadine (CLARITIN) 10 MG tablet Take 1 tablet (10 mg total) by mouth daily.  . metFORMIN (GLUCOPHAGE) 1000 MG tablet Take 1 tablet (1,000 mg total) by mouth 2 (two) times daily with a meal.  . metoprolol succinate (TOPROL-XL) 100 MG 24 hr tablet TAKE (1) TABLET TWICE A DAY.  . montelukast (SINGULAIR) 10 MG tablet Take 1 tablet (10 mg total) by mouth at bedtime.  . Multiple Vitamin (MULTIVITAMIN) tablet Take 1 tablet by mouth daily.    . Omega-3 Fatty Acids (FISH OIL) 1200 MG CAPS Take by mouth.  . Pyridoxine HCl (VITAMIN B-6 PO) Take 1 tablet by mouth 2 (two) times daily. Reported on 01/24/2016  . ranitidine (ZANTAC) 150 MG tablet Take 150 mg by mouth 2 (two) times daily.  . rosuvastatin (CRESTOR) 10 MG tablet TAKE 1 TABLET DAILY  . sitaGLIPtin (JANUVIA) 50 MG tablet Take 1 tablet (50 mg total) by mouth daily.  . vitamin B-12 (CYANOCOBALAMIN) 500 MCG tablet Take 500 mcg by mouth daily.    . Vitamin D, Ergocalciferol, (DRISDOL) 50000 units CAPS capsule TAKE 1 CAPSULE ONCE A WEEK   No facility-administered encounter medications on file as of 03/03/2018.      Current Examination:  Behavioral Observations:  Appearance: Appropriately dressedand groomed Gait:Ambulated independently,no gross abnormalities observed Speech: Nonfluent and telegraphic with frequent stammering, paraphasic errors, significant word finding difficulty. Speech example: "Uh sometimes uh little on the phone but like uh sometimes I can talk on the phone and sometimes it uh uh little worse I reckon." Thought process: Appears linear Affect: Full, bright, euthymic Interpersonal: Very pleasant, appropriate Orientation: Oriented to person, place, and most aspects of time (was one day off on the date). She could not name the current President but she accurately named his immediate predecessor.  Tests Administered: . Test of Premorbid Functioning (TOPF) . Wechsler Adult  Intelligence Scale-Fourth Edition (WAIS-IV): Similarities, Block Design, Matrix Reasoning, Coding and Digit Span subtests . [place] Verbal Learning Test - 2nd Edition (CVLT-2) Short Form . Repeatable Battery for the Assessment of Neuropsychological Status (RBANS) Form A:  Story Memory and Story Recall, Figure Copy and Recall  and Semantic Fluency subtests . Neuropsychological Assessment Battery (NAB) Language Module, Form 1: Auditory Comprehension, Naming, Reading Comprehension, Writing, and Bill Payment subtests  . [place] Diagnostic Aphasia Examination: Repetition of Words, Repetition of Phrases and Complex Ideational Material subtests . Controlled Oral Word Association Test (COWAT) . Trail Making Test A and B . Clock drawing test . Geriatric Depression Scale (GDS) 15 Item . Generalized Anxiety Disorder - 7 item screener (GAD-7)  Test Results: Note: Standardized scores are presented only for use by appropriately trained professionals and to allow for any future test-retest comparison. These scores should not be interpreted without consideration of all the information that is contained in the rest of the report. The most recent standardization samples from the test publisher or other sources were used whenever possible to derive standard scores; scores were corrected for age, gender, ethnicity and education when available.   Test   Scores:  Test Name Raw Score Standardized Score Descriptor  TOPF 20/70 SS= 82 Low average  WAIS-IV Subtests     Similarities 16/36 ss= 7 Low average  Block Design 28/66 ss= 10 Average  Matrix Reasoning 7/26 ss= 8 Low end of average  Coding 43/135 ss= 10 Average  Digit Span Forward 3/16 ss= 2 Impaired  Digit Span Backward 4/16 ss= 5 Borderline  RBANS Subtests     Story Memory 10/24 Z= -2.1 Impaired  Story Recall 6/12 Z= -1.4 Borderline  Figure Copy 20/20 Z= 1.2 High average  Figure Recall 8/20 Z= -1.1 Low average  Semantic Fluency 13 Z= -1.3 Low average    CVLT-II Scores     Trial 1 4/9 Z= -1 Low average  Trial 4 5/9 Z= -2 Impaired  Trials 1-4 total 21/36 T= 39 Low average  SD Free Recall 7/9 Z= 0 Average  LD Free Recall 6/9 Z= 0 Average  LD Cued Recall 6/9 Z= -0.5 Average  Recognition Discriminability 9/9 hits 0 false positives Z= 0.5 Average  Forced Choice Recognition 9/9  WNL  NAB Language subtests     Auditory Comprehension 80/89 T= 31 Borderline  Naming 23/31 T= 30 Impaired  Reading Comprehension Wrd: 5/6 Sen: 7/7  Mildly to moderately impaired  Writing 8/11 T= 26 Impaired  Bill Payment 16/19 T= 39 Low average  BDAE Subtests     Repetition of Words 8/10  Impaired  Repetition of Phrases 1/10  Severely impaired  Complex Ideational Material 7/12  Impaired  COWAT-FAS 10 T= 30 Impaired  COWAT-Animals 11 T= 37 Low average  Trail Making Test A  39" 1 error T= 55 Average  Trail Making Test B  Pt unable     Clock Drawing   WNL  GDS-15 5/15  Mild  GAD-7 3/21  WNL      Description of Test Results:  Psychomotor processing speed was average (stable from 2017).   She performed in the impaired range (stable from 2017) on tests of auditory attention and working memory, but this was likely due to the language component (I.e., repetition) of the task.   Visual-spatial construction was average to high average (stable from 2017).   Language abilities were significantly below expectation and overall declined relative to 2017. Specifically, confrontation naming was impaired (relative to low average in 2017). She demonstrated semantic and paraphasic errors. Semantic verbal fluency was low average (stable). Verbal fluency with phonemic search restrictions was impaired (stable). Writing was impaired. Words were legible, but she committed minor spelling errors, utilized mostly short phrases instead of full sentences, and did not convey all of the main story themes from the picture. Performance on a simulated bill payment task was low average.  She did well answering questions about the mock electric bill, and was able to fill out the mock check appropriately according to the bill, and address the mock envelope appropriately. However, she made some errors entering the information into the mock check ledger particularly in making mathematical calculations. Repetition of words was mildly impaired (stable). Repetition of phrases was severely impaired (only repeated 1/10 phrases accurately; in 2017, she repeated 7/10 accurately). Examples of errors include: "Know you how" (for "you know how"), "Lime is sour" (for "limes are sour"), "Not tell you" (for "You should not tell her"). Auditory comprehension was borderline (relative to low average in 2017). She did well with simple or straightforward commands but struggled when commands included "before" or "after". She had significant difficulty in correctly answering yes/no questions   involving complex ideational material. Reading comprehension was mildly impaired (relative to to normal in 2017); she had no difficulty with reading comprehension of sentences (when matching a sentence to a picture) but committed one errors with reading comprehension of words (matching word to a picture).   With regard to verbal memory, encoding and acquisition of non-contextual information (i.e., word list) was low average (improved). After a brief distracter task, free recall was average (7/9 items, improved). After a delay, free recall was average (6/9 items, stable). She did not benefit from semantic cueing to recall any additional items. Performance on a yes/no recognition task was intact with 100% accuracy (stable). On another verbal memory test, encoding and acquisition of contextual auditory information (i.e., short story) was impaired (stable). After a delay, free recall was borderline (improved). With regard to non-verbal memory, delayed free recall of visual information was low average (stable).   Performance on tasks  measuring various aspects of executive functioning ranged from impaired to normal. Mental flexibility and set-shifting were severely impaired; she was unable to complete Trails B (decline from 2017). Verbal abstract reasoning was low average (stable). Non-verbal abstract reasoning was low end of average (stable). Performance on a clock drawing task was normal (stable).   On a self-report measure of mood, the patient's responses were indicative of mild depression at the present time. Symptoms endorsed included: dropping interests/activities, boredom, preferring to stay home, reduced energy, and some hopelessness. On a self-report measure of anxiety, the patient did not endorse clinically significant generalized anxiety at the present time.    Clinical Impressions: Primary progressive aphasia (nonfluent/agrammatic variant).  Results of the current evaluation unfortunately demonstrate significant decline in language function since her last evaluation in 2017. The patient's clinical presentation and testing results are consistent with primary progressive aphasia (PPA), in that the most prominent clinical feature is difficulty with language, these deficits are the primary cause of change in daily living activities, and aphasia was the most prominent deficit at time of symptom onset and continues to be worsening. In particular, her presentation is consistent with the nonfluent variant of PPA, as demonstrated by agrammatic speech, apraxia of speech, impaired comprehension of syntactically complex sentences, spared single-word comprehension, and spared object knowledge. Fortunately, she is not demonstrating significant deficits in other domains of cognitive function at this time. Also, she is not reporting nor demonstrating significant psychological distress.    Recommendations/Plan: Based on the findings of the present evaluation, the following recommendations are offered:  1. Education regarding diagnosis  (nonfluent PPA) was provided to the patient and her daughter, and written materials were also given. 2. She may benefit from further work with speech therapy. 3. The patient's daughter wants to know if there is any correlation between the patient's PPA diagnosis and her audiology evaluation results. I advised that I do not know of any link but they can of course follow up with Dr. Aquino about this. Her daughter stated that the audiologist wanted Dr. Aquino's input on whether to try hearing aids or not.    Feedback to Patient: Virdia A Moronta and her daughter returned for a feedback appointment on 03/03/2018 to review the results of her neuropsychological evaluation with this provider. 20 minutes face-to-face time was spent reviewing her test results, my impressions and my recommendations as detailed above.    Total time spent on this patient's case: 100 minutes for neurobehavioral status exam with psychologist (CPT code 96116, 96121x1); 120 minutes of testing/scoring by psychometrician under psychologist's supervision (CPT codes 96138,   96139x3 units); 220 minutes for integration of patient data, interpretation of standardized test results and clinical data, clinical decision making, treatment planning and preparation of this report, and interactive feedback with review of results to the patient/family by psychologist (CPT codes 96132, 96133x3 units).     Thank you for your referral of Joetta A Creekmore. Please feel free to contact me if you have any questions or concerns regarding this report.     

## 2018-03-03 ENCOUNTER — Encounter

## 2018-03-03 ENCOUNTER — Encounter: Payer: Self-pay | Admitting: Psychology

## 2018-03-03 ENCOUNTER — Ambulatory Visit: Payer: Medicare Other | Admitting: Psychology

## 2018-03-03 DIAGNOSIS — G3101 Pick's disease: Secondary | ICD-10-CM | POA: Diagnosis not present

## 2018-03-03 DIAGNOSIS — F0280 Dementia in other diseases classified elsewhere without behavioral disturbance: Secondary | ICD-10-CM | POA: Diagnosis not present

## 2018-03-23 ENCOUNTER — Encounter: Payer: Self-pay | Admitting: Neurology

## 2018-03-23 ENCOUNTER — Ambulatory Visit: Payer: Medicare Other | Admitting: Neurology

## 2018-03-23 ENCOUNTER — Other Ambulatory Visit: Payer: Self-pay

## 2018-03-23 VITALS — BP 120/52 | HR 63 | Ht 63.0 in | Wt 160.0 lb

## 2018-03-23 DIAGNOSIS — F0280 Dementia in other diseases classified elsewhere without behavioral disturbance: Secondary | ICD-10-CM

## 2018-03-23 DIAGNOSIS — H9190 Unspecified hearing loss, unspecified ear: Secondary | ICD-10-CM | POA: Diagnosis not present

## 2018-03-23 DIAGNOSIS — G3101 Pick's disease: Secondary | ICD-10-CM

## 2018-03-23 NOTE — Progress Notes (Signed)
NEUROLOGY FOLLOW UP OFFICE NOTE  Kristi Willis [id number] 1941/02/02  HISTORY OF PRESENT ILLNESS: I had the pleasure of seeing Kristi Willis in follow-up in the neurology clinic on 03/23/2018.  The patient was last seen 6 months ago for worsening memory. She is accompanied by her daughter who helps supplement the history today.  Records and images were personally reviewed where available. On her last visit, she and her daughter expressed concern about worsening speech difficulties. Repeat Neuropsych testing was done last month which indicated a significant decline in language function since her last evaluation. Presentation and results were consistent with primary progressive aphasia (PPA), nonfluent variant. She had agrammatic speech, apraxia of speech, impaired comprehension of syntactically complex sentences, spared single-word comprehension, and spared object knowledge. They asked if hearing aids can help, she had a hearing test which showed asymmetry in hearing loss, recommending an ENT consult to rule out retrocochlear pathology. Her daughter is unsure if she is having difficulty hearing information or processing it. She continues to drive without difficulties, no missed bills or medications. She denies any headaches, dizziness, vision changes, no falls.    HPI 06/10/2016: This is a 77 yo RH woman with a history of hypertension, diabetes, neuropathy, who presented for evaluation of word-finding difficulties. She and her daughter started noticing symptoms for the past couple of years, but worsened after her husband passed away last 11-Jun-2023. She reports that she knows what she wants to say but it does not come out as it should. Her daughter noticed it was hard to have conversation with her. She started to want to be alone and stay at home due to this. She was started on an antidepressant, and reports that symptoms did improve a little, but she still struggles to get out the idea of what  she wants to say. She has good and bad days. She lives alone and denies getting lost driving. Her daughter though reports she falls asleep driving so her sister is with her for long distance driving. She occasionally forgets her medications and occasionally misplaces things. They could not find their Christmas gifts and got them in July. No missed bill payments. No hygiene issues, no difficulties with ADLs. Her daughter feels that the confusion is a little better, but she is still not comfortable talking to other people. She had an MRI brain without contrast done 04/17/16 for these symptoms which I personally reviewed, no acute changes, there was mild diffuse volume loss.  She has neuropathy with burning and numbness in both feet. She has occasional back pain, no falls. She occasionally has problems swallowing meat. She denies any headaches, dizziness, diplopia, dysarthria, neck pain, focal numbness/tingling/weakness, bowel/bladder dysfunction, anosmia, or tremors. No family history of dementia, no history of significant head injuries. She occasionally drinks alcohol.  PAST MEDICAL HISTORY: Past Medical History:  Diagnosis Date  . Allergy   . Asthma   . Cancer ([facility])    skin  . Diabetes mellitus   . GERD (gastroesophageal reflux disease)    hiatal hernia  . Hypercholesteremia   . Hypertension   . Neuromuscular disorder ([facility])    DM neuropathy  . Neuropathy   . SVT (supraventricular tachycardia) ([facility])     MEDICATIONS: Current Outpatient Medications on File Prior to Visit  Medication Sig Dispense Refill  . ACCU-CHEK AVIVA PLUS test strip CHECK BLOOD SUGAR UP TO 4 TIMES A DAY OR AS DIRECTED 100 each 1  . ACCU-CHEK SOFTCLIX LANCETS lancets CHECK BLOOD SUGAR UP TO 4  TIMES A DAY OR AS DIRECTED 100 each 1  . acyclovir ointment (ZOVIRAX) 5 % Apply 1 application topically every 3 (three) hours. Around mouth, Until symptoms clear 15 g 5  . albuterol (VENTOLIN HFA) 108 (90 BASE) MCG/ACT inhaler  Inhale 2 puffs into the lungs every 6 (six) hours as needed for wheezing. 1 Inhaler 3  . amLODipine (NORVASC) 5 MG tablet TAKE 1 TABLET ONCE DAILY 90 tablet 1  . aspirin 81 MG chewable tablet Chew 81 mg by mouth daily.    Marland Kitchen azelastine (ASTELIN) 0.1 % nasal spray Place 2 sprays into both nostrils 2 (two) times daily. 30 mL 11  . Blood Glucose Monitoring Suppl (ACCU-CHEK AVIVA PLUS) w/Device KIT CHECK BLOOD SUGAR UP TO 4 TIMES A DAY 1 kit 0  . Calcium Carbonate-Vitamin D (CALTRATE 600+D) 600-400 MG-UNIT per tablet Take 1 tablet by mouth 2 (two) times daily.    . cephALEXin (KEFLEX) 500 MG capsule Take 1 capsule (500 mg total) by mouth 2 (two) times daily. 10 capsule 0  . doxazosin (CARDURA) 8 MG tablet TAKE (1) TABLET DAILY FOR HIGH BLOOD PRESSURE. 90 tablet 0  . EPIPEN 2-PAK 0.3 MG/0.3ML SOAJ injection Reported on 01/24/2016    . escitalopram (LEXAPRO) 20 MG tablet Take 1 tablet (20 mg total) by mouth daily. 90 tablet 3  . gabapentin (NEURONTIN) 100 MG capsule Take 1 capsule (100 mg total) 3 (three) times daily by mouth. 90 capsule 3  . ketoconazole (NIZORAL) 2 % cream Apply 1 application topically daily. 15 g 0  . lisinopril (PRINIVIL,ZESTRIL) 40 MG tablet TAKE 1 TABLET DAILY 90 tablet 0  . loratadine (CLARITIN) 10 MG tablet Take 1 tablet (10 mg total) by mouth daily. 30 tablet 11  . metFORMIN (GLUCOPHAGE) 1000 MG tablet Take 1 tablet (1,000 mg total) by mouth 2 (two) times daily with a meal. 60 tablet 2  . metoprolol succinate (TOPROL-XL) 100 MG 24 hr tablet TAKE (1) TABLET TWICE A DAY. 180 tablet 1  . montelukast (SINGULAIR) 10 MG tablet Take 1 tablet (10 mg total) by mouth at bedtime. 30 tablet 5  . Multiple Vitamin (MULTIVITAMIN) tablet Take 1 tablet by mouth daily.      . Omega-3 Fatty Acids (FISH OIL) 1200 MG CAPS Take by mouth.    . Pyridoxine HCl (VITAMIN B-6 PO) Take 1 tablet by mouth 2 (two) times daily. Reported on 01/24/2016    . ranitidine (ZANTAC) 150 MG tablet Take 150 mg by mouth 2  (two) times daily.    . rosuvastatin (CRESTOR) 10 MG tablet TAKE 1 TABLET DAILY 90 tablet 0  . sitaGLIPtin (JANUVIA) 50 MG tablet Take 1 tablet (50 mg total) by mouth daily. 30 tablet 3  . vitamin B-12 (CYANOCOBALAMIN) 500 MCG tablet Take 500 mcg by mouth daily.      . Vitamin D, Ergocalciferol, (DRISDOL) 50000 units CAPS capsule TAKE 1 CAPSULE ONCE A WEEK 12 capsule 0   No current facility-administered medications on file prior to visit.     ALLERGIES: Allergies  Allergen Reactions  . Dimetapp C [Phenylephrine-Bromphen-Codeine] Itching  . Phenylephrine Hcl Other (See Comments)    unknown  . Ultram [Tramadol Hcl] Other (See Comments)    Insomnia  . Celebrex [Celecoxib] Rash  . Cortisone Rash  . Fenofibrate Rash  . Sulfa Antibiotics Rash    FAMILY HISTORY: Family History  Problem Relation Age of Onset  . Cancer Mother 72       cervical cancer  . Depression Mother   .  Heart disease Father   . Stroke Father   . Cancer Father        lung cancer  . Heart disease Sister 78       MI  . Hypertension Sister   . Cancer Sister 13       breast cancer  . Healthy Daughter     SOCIAL HISTORY: Social History   Socioeconomic History  . Marital status: Widowed    Spouse name: Not on file  . Number of children: Not on file  . Years of education: Not on file  . Highest education level: Not on file  Occupational History  . Occupation: Retired from [employer] but still works part-time  Social Needs  . Financial resource strain: Not on file  . Food insecurity:    Worry: Not on file    Inability: Not on file  . Transportation needs:    Medical: Not on file    Non-medical: Not on file  Tobacco Use  . Smoking status: Never Smoker  . Smokeless tobacco: Never Used  Substance and Sexual Activity  . Alcohol use: No  . Drug use: No  . Sexual activity: Never  Lifestyle  . Physical activity:    Days per week: Not on file    Minutes per session: Not on file  . Stress: Not on file    Relationships  . Social connections:    Talks on phone: Not on file    Gets together: Not on file    Attends religious service: Not on file    Active member of club or organization: Not on file    Attends meetings of clubs or organizations: Not on file    Relationship status: Not on file  . Intimate partner violence:    Fear of current or ex partner: Not on file    Emotionally abused: Not on file    Physically abused: Not on file    Forced sexual activity: Not on file  Other Topics Concern  . Not on file  Social History Narrative   Lives with husband, who is a patient of Dr Lovena Le. She is active around the house and exercises regularly.    REVIEW OF SYSTEMS: Constitutional: No fevers, chills, or sweats, no generalized fatigue, change in appetite Eyes: No visual changes, double vision, eye pain Ear, nose and throat: No hearing loss, ear pain, nasal congestion, sore throat Cardiovascular: No chest pain, palpitations Respiratory:  No shortness of breath at rest or with exertion, wheezes GastrointestinaI: No nausea, vomiting, diarrhea, abdominal pain, fecal incontinence Genitourinary:  No dysuria, urinary retention or frequency Musculoskeletal:  No neck pain,+ back pain Integumentary: No rash, pruritus, skin lesions Neurological: as above Psychiatric: + depression, insomnia, anxiety Endocrine: No palpitations, fatigue, diaphoresis, mood swings, change in appetite, change in weight, increased thirst Hematologic/Lymphatic:  No anemia, purpura, petechiae. Allergic/Immunologic: no itchy/runny eyes, nasal congestion, recent allergic reactions, rashes  PHYSICAL EXAM: Vitals:   03/23/18 1502  BP: (!) 120/52  Pulse: 63  SpO2: 96%   General: No acute distress Head:  Normocephalic Skin/Extremities: No rash, no edema Neurological Exam: alert, word-finding difficulties noted. Cranial nerves: Pupils equal, round. No facial asymmetry. Motor: moves all extremities symmetrically. Gait  narrow-based and steady. No tremor.  IMPRESSION: This is a 77 yo RH woman with a history of  hypertension, diabetes, neuropathy, who presented in October 2017 with word-finding difficulties. Repeat Neuropsychological testing last month showed significant decline from prior testing, with presentation and results consistent with Primary  Progressive Aphasia, nonfluent variant. Recommendations were discussed today, she has completed Speech Therapy and was given home exercises. Her audiology results were reviewed, there is asymmetry in hearing loss and ENT consult was recommended to rule out retrocochlear pathology. We discussed repeating the MRI brain, if ENT has specific studies they would like ordered, we can add on MRI brain to this. We discussed home safety, continue to monitor driving, continue control of vascular risk factors, physical exercise, and brain stimulation exercises for brain health. We discussed monitoring mood in patients with PPA, continue Lexapro 74m daily. She will follow-up in 6 months and knows to call for any changes.   Thank you for allowing me to participate in her care.  Please do not hesitate to call for any questions or concerns.  The duration of this appointment visit was 20 minutes of face-to-face time with the patient.  Greater than 50% of this time was spent in counseling, explanation of diagnosis, planning of further management, and coordination of care.   KEllouise Newer M.D.   CC: CEvelina Dun FNP

## 2018-03-23 NOTE — Patient Instructions (Signed)
1. Refer to ENT for evaluation to rule out retrocochlear pathology 2. Continue with home speech exercises, look into the National Aphasia Association website:  https://www.bailey.com/  3. Follow-up in 6 months, call for any changes  FALL PRECAUTIONS: Be cautious when walking. Scan the area for obstacles that may increase the risk of trips and falls. When getting up in the mornings, sit up at the edge of the bed for a few minutes before getting out of bed. Consider elevating the bed at the head end to avoid drop of blood pressure when getting up. Walk always in a well-lit room (use night lights in the walls). Avoid area rugs or power cords from appliances in the middle of the walkways. Use a walker or a cane if necessary and consider physical therapy for balance exercise. Get your eyesight checked regularly.  FINANCIAL OVERSIGHT: Supervision, especially oversight when making financial decisions or transactions is also recommended.  HOME SAFETY: Consider the safety of the kitchen when operating appliances like stoves, microwave oven, and blender. Consider having supervision and share cooking responsibilities until no longer able to participate in those. Accidents with firearms and other hazards in the house should be identified and addressed as well.  DRIVING: Regarding driving, in patients with progressive memory problems, driving will be impaired. We advise to have someone else do the driving if trouble finding directions or if minor accidents are reported. Independent driving assessment is available to determine safety of driving.  ABILITY TO BE LEFT ALONE: If patient is unable to contact 911 operator, consider using LifeLine, or when the need is there, arrange for someone to stay with patients. Smoking is a fire hazard, consider supervision or cessation. Risk of wandering should be assessed by caregiver and if detected at any point,  supervision and safe proof recommendations should be instituted.  MEDICATION SUPERVISION: Inability to self-administer medication needs to be constantly addressed. Implement a mechanism to ensure safe administration of the medications.  RECOMMENDATIONS FOR ALL PATIENTS WITH MEMORY PROBLEMS: 1. Continue to exercise (Recommend 30 minutes of walking everyday, or 3 hours every week) 2. Increase social interactions - continue going to [place] and enjoy social gatherings with friends and family 3. Eat healthy, avoid fried foods and eat more fruits and vegetables 4. Maintain adequate blood pressure, blood sugar, and blood cholesterol level. Reducing the risk of stroke and cardiovascular disease also helps promoting better memory. 5. Avoid stressful situations. Live a simple life and avoid aggravations. Organize your time and prepare for the next day in anticipation. 6. Sleep well, avoid any interruptions of sleep and avoid any distractions in the bedroom that may interfere with adequate sleep quality 7. Avoid sugar, avoid sweets as there is a strong link between excessive sugar intake, diabetes, and cognitive impairment The Mediterranean diet has been shown to help patients reduce the risk of progressive memory disorders and reduces cardiovascular risk. This includes eating fish, eat fruits and green leafy vegetables, nuts like almonds and hazelnuts, walnuts, and also use olive oil. Avoid fast foods and fried foods as much as possible. Avoid sweets and sugar as sugar use has been linked to worsening of memory function.  There is always a concern of gradual progression of memory problems. If this is the case, then we may need to adjust level of care according to patient needs. Support, both to the patient and caregiver, should then be put into place.

## 2018-04-07 ENCOUNTER — Ambulatory Visit: Payer: Medicare Other | Admitting: Nurse Practitioner

## 2018-04-07 ENCOUNTER — Encounter: Payer: Self-pay | Admitting: Nurse Practitioner

## 2018-04-07 VITALS — BP 140/56 | HR 67 | Temp 98.7°F | Ht 63.0 in | Wt 164.0 lb

## 2018-04-07 DIAGNOSIS — R059 Cough, unspecified: Secondary | ICD-10-CM

## 2018-04-07 DIAGNOSIS — J0101 Acute recurrent maxillary sinusitis: Secondary | ICD-10-CM | POA: Diagnosis not present

## 2018-04-07 DIAGNOSIS — R05 Cough: Secondary | ICD-10-CM

## 2018-04-07 MED ORDER — BENZONATATE 100 MG PO CAPS: 100.0000 mg | ORAL_CAPSULE | Freq: Three times a day (TID) | ORAL | 0 refills | Status: DC | PRN

## 2018-04-07 MED ORDER — AZITHROMYCIN 250 MG PO TABS: ORAL_TABLET | ORAL | 0 refills | Status: DC

## 2018-04-07 NOTE — Progress Notes (Signed)
Subjective:    Patient ID: Kristi Willis, female    DOB: 03-04-1941, 77 y.o.   MRN: [id number]   Chief Complaint: URI (head and chest congestion x 1 wk)   HPI Patient comes in today c/o cough and congestion.started about 5 days ago. Sh ehas bad asthma and is currently on allergy shots.     Review of Systems  Constitutional: Negative for chills and fever.  HENT: Positive for congestion, sinus pain and sore throat. Negative for ear pain and trouble swallowing.   Respiratory: Positive for cough. Negative for shortness of breath.   Cardiovascular: Negative.   Musculoskeletal: Negative.   Neurological: Positive for headaches.  Psychiatric/Behavioral: Negative.   All other systems reviewed and are negative.      Objective:   Physical Exam  Constitutional: She is oriented to person, place, and time. She appears well-developed and well-nourished. She appears distressed (mild).  HENT:  Right Ear: Hearing, tympanic membrane, external ear and ear canal normal.  Left Ear: Hearing, tympanic membrane, external ear and ear canal normal.  Nose: Mucosal edema and rhinorrhea present. Right sinus exhibits maxillary sinus tenderness. Left sinus exhibits maxillary sinus tenderness.  Mouth/Throat: Uvula is midline, oropharynx is clear and moist and mucous membranes are normal.  Cardiovascular: Normal rate and regular rhythm.  Pulmonary/Chest: Effort normal.  Neurological: She is alert and oriented to person, place, and time.  Skin: Skin is warm.  Psychiatric: She has a normal mood and affect. Her behavior is normal. Thought content normal.   BP (!) 140/56 (BP Location: Left Arm, Patient Position: Sitting, Cuff Size: Normal)   Pulse 67   Temp 98.7 F (37.1 C) (Oral)   Ht 5\' 3"  (1.6 m)   Wt 164 lb (74.4 kg)   BMI 29.05 kg/m      Assessment & Plan:  Kristi Willis in today with chief complaint of URI (head and chest congestion x 1 wk)   1. Acute recurrent maxillary  sinusitis 1. Take meds as prescribed 2. Use a cool mist humidifier especially during the winter months and when heat has been humid. 3. Use saline nose sprays frequently 4. Saline irrigations of the nose can be very helpful if done frequently.  * 4X daily for 1 week*  * Use of a nettie pot can be helpful with this. Follow directions with this* 5. Drink plenty of fluids 6. Keep thermostat turn down low 7.For any cough or congestion  Use plain Mucinex- regular strength or max strength is fine   * Children- consult with Pharmacist for dosing 8. For fever or aces or pains- take tylenol or ibuprofen appropriate for age and weight.  * for fevers greater than 101 orally you may alternate ibuprofen and tylenol every  3 hours.   Meds ordered this encounter  Medications  . azithromycin (ZITHROMAX Z-PAK) 250 MG tablet    Sig: As directed    Dispense:  6 tablet    Refill:  0    Order Specific Question:   Supervising Provider    Answer:   VINCENT, CAROL L [[id number]]  . benzonatate (TESSALON PERLES) 100 MG capsule    Sig: Take 1 capsule (100 mg total) by mouth 3 (three) times daily as needed for cough.    Dispense:  20 capsule    Refill:  0    Order Specific Question:   Supervising Provider    Answer:   VINCENT, CAROL L [[id number]]     2. Cough Force fluids Rest RTO  prn   Mary-Margaret Hassell Done, FNP

## 2018-04-07 NOTE — Patient Instructions (Signed)

## 2018-04-08 ENCOUNTER — Other Ambulatory Visit: Payer: Self-pay

## 2018-04-10 ENCOUNTER — Other Ambulatory Visit: Payer: Self-pay | Admitting: Family

## 2018-04-27 ENCOUNTER — Other Ambulatory Visit: Payer: Self-pay | Admitting: "Endocrinology

## 2018-05-05 ENCOUNTER — Other Ambulatory Visit: Payer: Self-pay | Admitting: "Endocrinology

## 2018-05-19 ENCOUNTER — Other Ambulatory Visit: Payer: Self-pay | Admitting: Family

## 2018-05-25 ENCOUNTER — Other Ambulatory Visit: Payer: Self-pay | Admitting: Family

## 2018-06-16 ENCOUNTER — Ambulatory Visit (INDEPENDENT_AMBULATORY_CARE_PROVIDER_SITE_OTHER): Payer: Medicare Other

## 2018-06-16 DIAGNOSIS — Z23 Encounter for immunization: Secondary | ICD-10-CM | POA: Diagnosis not present

## 2018-06-17 ENCOUNTER — Other Ambulatory Visit: Payer: Self-pay

## 2018-06-17 MED ORDER — SITAGLIPTIN PHOSPHATE 50 MG PO TABS: ORAL_TABLET | ORAL | 0 refills | Status: DC

## 2018-06-17 MED ORDER — METFORMIN HCL 1000 MG PO TABS: 1000.0000 mg | ORAL_TABLET | Freq: Two times a day (BID) | ORAL | 0 refills | Status: DC

## 2018-06-29 ENCOUNTER — Other Ambulatory Visit: Payer: Self-pay | Admitting: "Endocrinology

## 2018-06-29 ENCOUNTER — Other Ambulatory Visit: Payer: Medicare Other

## 2018-06-29 DIAGNOSIS — E1165 Type 2 diabetes mellitus with hyperglycemia: Secondary | ICD-10-CM

## 2018-06-30 LAB — COMPREHENSIVE METABOLIC PANEL
A/G RATIO: 1.9 (ref 1.2–2.2)
ALT: 18 IU/L (ref 0–32)
AST: 22 IU/L (ref 0–40)
Albumin: 4.4 g/dL (ref 3.5–4.8)
Alkaline Phosphatase: 50 IU/L (ref 39–117)
BUN/Creatinine Ratio: 20 (ref 12–28)
BUN: 12 mg/dL (ref 8–27)
Bilirubin Total: 0.3 mg/dL (ref 0.0–1.2)
CALCIUM: 9.2 mg/dL (ref 8.7–10.3)
CO2: 22 mmol/L (ref 20–29)
CREATININE: 0.6 mg/dL (ref 0.57–1.00)
Chloride: 100 mmol/L (ref 96–106)
GFR, EST AFRICAN AMERICAN: 102 mL/min/{1.73_m2} (ref >59)
GFR, EST NON AFRICAN AMERICAN: 89 mL/min/{1.73_m2} (ref >59)
Globulin, Total: 2.3 g/dL (ref 1.5–4.5)
Glucose: 164 mg/dL — ABNORMAL HIGH (ref 65–99)
POTASSIUM: 4.4 mmol/L (ref 3.5–5.2)
SODIUM: 139 mmol/L (ref 134–144)
TOTAL PROTEIN: 6.7 g/dL (ref 6.0–8.5)

## 2018-06-30 LAB — HEMOGLOBIN A1C
ESTIMATED AVERAGE GLUCOSE: 157 mg/dL
Hgb A1c MFr Bld: 7.1 % — ABNORMAL HIGH (ref 4.8–5.6)

## 2018-07-06 ENCOUNTER — Ambulatory Visit: Payer: Medicare Other | Admitting: "Endocrinology

## 2018-07-06 ENCOUNTER — Encounter: Payer: Self-pay | Admitting: "Endocrinology

## 2018-07-06 VITALS — BP 137/68 | HR 69 | Ht 63.0 in | Wt 165.0 lb

## 2018-07-06 DIAGNOSIS — I10 Essential (primary) hypertension: Secondary | ICD-10-CM | POA: Diagnosis not present

## 2018-07-06 DIAGNOSIS — E1165 Type 2 diabetes mellitus with hyperglycemia: Secondary | ICD-10-CM | POA: Diagnosis not present

## 2018-07-06 DIAGNOSIS — E782 Mixed hyperlipidemia: Secondary | ICD-10-CM

## 2018-07-06 MED ORDER — SITAGLIPTIN PHOSPHATE 50 MG PO TABS: ORAL_TABLET | ORAL | 1 refills | Status: DC

## 2018-07-06 MED ORDER — METFORMIN HCL 1000 MG PO TABS: 1000.0000 mg | ORAL_TABLET | Freq: Two times a day (BID) | ORAL | 1 refills | Status: DC

## 2018-07-06 NOTE — Progress Notes (Signed)
 Endocrinology follow-up note   Subjective:    Patient ID: Kristi Willis, female    DOB: 09/13/1940. Patient is being seen in follow-up for  management of type 2 diabetes, hyperlipidemia, hypertension. PCP: Hawks, Christy A, FNP  Past Medical History:  Diagnosis Date  . Allergy   . Asthma   . Cancer (HCC)    skin  . Diabetes mellitus   . GERD (gastroesophageal reflux disease)    hiatal hernia  . Hypercholesteremia   . Hypertension   . Neuromuscular disorder (HCC)    DM neuropathy  . Neuropathy   . SVT (supraventricular tachycardia) (HCC)    Past Surgical History:  Procedure Laterality Date  . BREAST BIOPSY     left breast biopsy  . LUMBAR LAMINECTOMY  08/10  . SKIN CANCER EXCISION  2005  . SPINE SURGERY     lumbar laminectomy   Social History   Socioeconomic History  . Marital status: Widowed    Spouse name: Not on file  . Number of children: Not on file  . Years of education: Not on file  . Highest education level: Not on file  Occupational History  . Occupation: Retired from Day Care but still works part-time  Social Needs  . Financial resource strain: Not on file  . Food insecurity:    Worry: Not on file    Inability: Not on file  . Transportation needs:    Medical: Not on file    Non-medical: Not on file  Tobacco Use  . Smoking status: Never Smoker  . Smokeless tobacco: Never Used  Substance and Sexual Activity  . Alcohol use: No  . Drug use: No  . Sexual activity: Never  Lifestyle  . Physical activity:    Days per week: Not on file    Minutes per session: Not on file  . Stress: Not on file  Relationships  . Social connections:    Talks on phone: Not on file    Gets together: Not on file    Attends religious service: Not on file    Active member of club or organization: Not on file    Attends meetings of clubs or organizations: Not on file    Relationship status: Not on file  Other Topics Concern  . Not on file  Social History  Narrative   Lives with husband, who is a patient of Dr Taylor. She is active around the house and exercises regularly.   Outpatient Encounter Medications as of 07/06/2018  Medication Sig  . ACCU-CHEK AVIVA PLUS test strip CHECK BLOOD SUGAR UP TO 4 TIMES A DAY OR AS DIRECTED  . ACCU-CHEK SOFTCLIX LANCETS lancets CHECK BLOOD SUGAR UP TO 4 TIMES A DAY OR AS DIRECTED  . acyclovir ointment (ZOVIRAX) 5 % Apply 1 application topically every 3 (three) hours. Around mouth, Until symptoms clear  . albuterol (VENTOLIN HFA) 108 (90 BASE) MCG/ACT inhaler Inhale 2 puffs into the lungs every 6 (six) hours as needed for wheezing.  . amLODipine (NORVASC) 5 MG tablet TAKE 1 TABLET ONCE DAILY  . aspirin 81 MG chewable tablet Chew 81 mg by mouth daily.  . azelastine (ASTELIN) 0.1 % nasal spray Place 2 sprays into both nostrils 2 (two) times daily.  . Blood Glucose Monitoring Suppl (ACCU-CHEK AVIVA PLUS) w/Device KIT CHECK BLOOD SUGAR UP TO 4 TIMES A DAY  . Calcium Carbonate-Vitamin D (CALTRATE 600+D) 600-400 MG-UNIT per tablet Take 1 tablet by mouth 2 (two) times daily.  . cephALEXin (  KEFLEX) 500 MG capsule Take 1 capsule (500 mg total) by mouth 2 (two) times daily.  . doxazosin (CARDURA) 8 MG tablet TAKE (1) TABLET DAILY FOR HIGH BLOOD PRESSURE.  . EPIPEN 2-PAK 0.3 MG/0.3ML SOAJ injection Reported on 01/24/2016  . escitalopram (LEXAPRO) 20 MG tablet Take 1 tablet (20 mg total) by mouth daily.  . gabapentin (NEURONTIN) 100 MG capsule Take 1 capsule (100 mg total) 3 (three) times daily by mouth.  . gabapentin (NEURONTIN) 300 MG capsule   . ketoconazole (NIZORAL) 2 % cream APPLY TO THE AFFECTED AREA(S) DAILY AS DIRECTED  . lisinopril (PRINIVIL,ZESTRIL) 40 MG tablet TAKE 1 TABLET DAILY  . loratadine (CLARITIN) 10 MG tablet Take 1 tablet (10 mg total) by mouth daily.  . metFORMIN (GLUCOPHAGE) 1000 MG tablet Take 1 tablet (1,000 mg total) by mouth 2 (two) times daily with a meal.  . metoprolol succinate (TOPROL-XL) 100  MG 24 hr tablet TAKE (1) TABLET TWICE A DAY.  . montelukast (SINGULAIR) 10 MG tablet Take 1 tablet (10 mg total) by mouth at bedtime.  . Multiple Vitamin (MULTIVITAMIN) tablet Take 1 tablet by mouth daily.    . Omega-3 Fatty Acids (FISH OIL) 1200 MG CAPS Take by mouth.  . Pyridoxine HCl (VITAMIN B-6 PO) Take 1 tablet by mouth 2 (two) times daily. Reported on 01/24/2016  . ranitidine (ZANTAC) 150 MG tablet Take 150 mg by mouth 2 (two) times daily.  . rosuvastatin (CRESTOR) 10 MG tablet TAKE 1 TABLET DAILY  . sitaGLIPtin (JANUVIA) 50 MG tablet Take 1 tablet (50 mg total) by mouth daily.  . vitamin B-12 (CYANOCOBALAMIN) 500 MCG tablet Take 500 mcg by mouth daily.    . Vitamin D, Ergocalciferol, (DRISDOL) 50000 units CAPS capsule TAKE 1 CAPSULE ONCE A WEEK  . [DISCONTINUED] azithromycin (ZITHROMAX Z-PAK) 250 MG tablet As directed  . [DISCONTINUED] benzonatate (TESSALON PERLES) 100 MG capsule Take 1 capsule (100 mg total) by mouth 3 (three) times daily as needed for cough.  . [DISCONTINUED] metFORMIN (GLUCOPHAGE) 1000 MG tablet Take 1 tablet (1,000 mg total) by mouth 2 (two) times daily with a meal.  . [DISCONTINUED] sitaGLIPtin (JANUVIA) 50 MG tablet Take 1 tablet (50 mg total) by mouth daily.   No facility-administered encounter medications on file as of 07/06/2018.    ALLERGIES: Allergies  Allergen Reactions  . Dimetapp C [Phenylephrine-Bromphen-Codeine] Itching  . Phenylephrine Hcl Other (See Comments)    unknown  . Ultram [Tramadol Hcl] Other (See Comments)    Insomnia  . Celebrex [Celecoxib] Rash  . Cortisone Rash  . Fenofibrate Rash  . Sulfa Antibiotics Rash   VACCINATION STATUS: Immunization History  Administered Date(s) Administered  . Influenza, High Dose Seasonal PF 06/13/2016, 07/03/2017, 06/16/2018  . Influenza,inj,Quad PF,6+ Mos 05/20/2013, 05/31/2014, 07/18/2015  . Influenza-Unspecified 08/20/2011  . Pneumococcal Conjugate-13 05/31/2014  . Pneumococcal Polysaccharide-23  07/22/2012  . Tdap 02/02/2015    Diabetes  She presents for her follow-up diabetic visit. She has type 2 diabetes mellitus. Onset time: She was diagnosed at approximate age of 65 years. Her disease course has been stable. There are no hypoglycemic associated symptoms. Pertinent negatives for hypoglycemia include no confusion, headaches, pallor or seizures. Pertinent negatives for diabetes include no chest pain, no fatigue, no foot paresthesias, no polydipsia, no polyphagia and no polyuria. There are no hypoglycemic complications. Symptoms are stable. Diabetic complications include peripheral neuropathy. Risk factors for coronary artery disease include diabetes mellitus, dyslipidemia, hypertension and sedentary lifestyle. Her weight is stable. She is following a   generally unhealthy diet. When asked about meal planning, she reported none. She has not had a previous visit with a dietitian. She rarely participates in exercise. An ACE inhibitor/angiotensin II receptor blocker is being taken. She sees a podiatrist.Eye exam is current.  Hyperlipidemia  This is a chronic problem. The current episode started more than 1 year ago. The problem is controlled. Recent lipid tests were reviewed and are high. Exacerbating diseases include diabetes. Pertinent negatives include no chest pain, myalgias or shortness of breath. Current antihyperlipidemic treatment includes statins. Risk factors for coronary artery disease include dyslipidemia, diabetes mellitus, hypertension and a sedentary lifestyle.  Hypertension  This is a chronic problem. The current episode started more than 1 year ago. The problem is controlled. Pertinent negatives include no chest pain, headaches, palpitations or shortness of breath. Risk factors for coronary artery disease include diabetes mellitus, dyslipidemia, sedentary lifestyle and family history. Past treatments include ACE inhibitors and beta blockers.    Review of Systems  Constitutional:  Negative for chills, fatigue, fever and unexpected weight change.  HENT: Negative for trouble swallowing and voice change.   Eyes: Negative for visual disturbance.  Respiratory: Negative for cough, shortness of breath and wheezing.   Cardiovascular: Negative for chest pain, palpitations and leg swelling.  Gastrointestinal: Negative for diarrhea, nausea and vomiting.  Endocrine: Negative for cold intolerance, heat intolerance, polydipsia, polyphagia and polyuria.  Musculoskeletal: Negative for arthralgias and myalgias.  Skin: Negative for color change, pallor, rash and wound.  Neurological: Negative for seizures and headaches.  Psychiatric/Behavioral: Negative for confusion and suicidal ideas.    Objective:    BP 137/68   Pulse 69   Ht 5' 3" (1.6 m)   Wt 165 lb (74.8 kg)   BMI 29.23 kg/m   Wt Readings from Last 3 Encounters:  07/06/18 165 lb (74.8 kg)  04/07/18 164 lb (74.4 kg)  03/23/18 160 lb (72.6 kg)    Physical Exam  Constitutional: She is oriented to person, place, and time. She appears well-developed.  HENT:  Head: Normocephalic and atraumatic.  Eyes: EOM are normal.  Neck: Normal range of motion. Neck supple. No tracheal deviation present. No thyromegaly present.  Pulmonary/Chest: Effort normal.  Abdominal: There is no tenderness. There is no guarding.  Musculoskeletal: Normal range of motion. She exhibits no edema.  Neurological: She is alert and oriented to person, place, and time. No cranial nerve deficit. Coordination normal.  Hard of hearing, motor aphasia.  Skin: Skin is warm and dry. No rash noted. No erythema. No pallor.  Psychiatric: She has a normal mood and affect. Judgment normal.    CMP     Component Value Date/Time   NA 139 06/29/2018 0805   K 4.4 06/29/2018 0805   CL 100 06/29/2018 0805   CO2 22 06/29/2018 0805   GLUCOSE 164 (H) 06/29/2018 0805   GLUCOSE 187 (H) 02/04/2016 1934   BUN 12 06/29/2018 0805   CREATININE 0.60 06/29/2018 0805    CREATININE 0.54 03/02/2013 0931   CALCIUM 9.2 06/29/2018 0805   PROT 6.7 06/29/2018 0805   ALBUMIN 4.4 06/29/2018 0805   AST 22 06/29/2018 0805   ALT 18 06/29/2018 0805   ALKPHOS 50 06/29/2018 0805   BILITOT 0.3 06/29/2018 0805   GFRNONAA 89 06/29/2018 0805   GFRNONAA >89 03/02/2013 0931   GFRAA 102 06/29/2018 0805   GFRAA >89 03/02/2013 0931     Diabetic Labs (most recent): Lab Results  Component Value Date   HGBA1C 7.1 (H) 06/29/2018  HGBA1C 7.1 (H) 02/17/2018   HGBA1C 7.1 10/03/2017     Lipid Panel ( most recent) Lipid Panel     Component Value Date/Time   CHOL 171 10/03/2017 1038   CHOL 177 02/23/2013 0911   TRIG 114 10/03/2017 1038   TRIG 203 (H) 01/12/2015 0915   TRIG 293 (H) 02/23/2013 0911   HDL 46 10/03/2017 1038   HDL 40 01/12/2015 0915   HDL 48 02/23/2013 0911   CHOLHDL 3.7 10/03/2017 1038   LDLCALC 102 (H) 10/03/2017 1038   LDLCALC 119 (H) 10/06/2013 1012   LDLCALC 70 02/23/2013 0911      Assessment & Plan:   1. Uncontrolled type 2 diabetes mellitus with diabetic neuropathy, without long-term current use of insulin (HCC)  - Patient has currently uncontrolled symptomatic type 2 DM since  77 years of age. -She returns with a stable A1c of 7.1%, generally improving from 9.3%.     Recent labs reviewed, showing normal renal function.   Her diabetes is complicated by referral neuropathy and patient remains at a high risk for more acute and chronic complications of diabetes which include CAD, CVA, CKD, retinopathy, and neuropathy. These are all discussed in detail with the patient.  - I have counseled the patient on diet management and weight loss, by adopting a carbohydrate restricted/protein rich diet.  -  Suggestion is made for her to avoid simple carbohydrates  from her diet including Cakes, Sweet Desserts / Pastries, Ice Cream, Soda (diet and regular), Sweet Tea, Candies, Chips, Cookies, Store Bought Juices, Alcohol in Excess of  1-2 drinks a day,  Artificial Sweeteners, and "Sugar-free" Products. This will help patient to have stable blood glucose profile and potentially avoid unintended weight gain.  - I encouraged the patient to switch to  unprocessed or minimally processed complex starch and increased protein intake (animal or plant source), fruits, and vegetables.  - Patient is advised to stick to a routine mealtimes to eat 3 meals  a day and avoid unnecessary snacks ( to snack only to correct hypoglycemia).   - I have approached patient with the following individualized plan to manage diabetes and patient agrees:   -She has maintained reasonable control of diabetes.  -Based on her presentation with controlled A1c of 7.1% on metformin and Januvia, she will not require insulin treatment at this time.    -He is advised to continue metformin 1000 mg p.o. twice daily with breakfast and supper, as well as Januvia 50 mg p.o. daily with breakfast. - Side effects and precautions discussed with her.  - Patient specific target  A1c;  LDL, HDL, Triglycerides, and  Waist Circumference were discussed in detail.  2) BP/HTN: Her blood pressure is controlled to target.  She is advised to continue her current blood pressure medications including lisinopril 40 mg p.o. daily, metoprolol, and amlodipine.    3) Lipids/HPL: Uncontrolled but improving, LDL 102.  He is advised to continue Crestor 10 mg p.o. Nightly.  4)  Weight/Diet: CDE Consult has been initiated , exercise, and detailed carbohydrates information provided.  5) Chronic Care/Health Maintenance:  -Patient is on ACEI/ARB and Statin medications and encouraged to continue to follow up with Ophthalmology, Podiatrist at least yearly or according to recommendations, and advised to  stay away from smoking. I have recommended yearly flu vaccine and pneumonia vaccination at least every 5 years; moderate intensity exercise for up to 150 minutes weekly; and  sleep for at least 7 hours a day.  - I  advised   patient to maintain close follow up with Sharion Balloon, FNP for primary care needs.  - Time spent with the patient: 25 min, of which >50% was spent in reviewing her  current and  previous labs, previous treatments, and medications doses and developing a plan for long-term care.  [name] participated in the discussions, expressed understanding, and voiced agreement with the above plans.  All questions were answered to her satisfaction. she is encouraged to contact clinic should she have any questions or concerns prior to her return visit.   Follow up plan: - Return in about 4 months (around 11/04/2018) for Follow up with Pre-visit Labs.  Glade Lloyd, MD Phone: [phone]  Fax: [phone]  -  This note was partially dictated with voice recognition software. Similar sounding words can be transcribed inadequately or may not  be corrected upon review.  07/06/2018, 5:06 PM

## 2018-07-06 NOTE — Patient Instructions (Signed)

## 2018-07-10 ENCOUNTER — Other Ambulatory Visit: Payer: Self-pay | Admitting: Family

## 2018-07-17 ENCOUNTER — Other Ambulatory Visit: Payer: Self-pay | Admitting: Family

## 2018-08-04 ENCOUNTER — Other Ambulatory Visit: Payer: Self-pay | Admitting: Family

## 2018-08-17 ENCOUNTER — Other Ambulatory Visit: Payer: Self-pay | Admitting: Family

## 2018-08-21 ENCOUNTER — Other Ambulatory Visit: Payer: Self-pay | Admitting: Family

## 2018-08-31 ENCOUNTER — Ambulatory Visit (INDEPENDENT_AMBULATORY_CARE_PROVIDER_SITE_OTHER): Payer: Medicare Other

## 2018-08-31 ENCOUNTER — Ambulatory Visit: Payer: Medicare Other | Admitting: Physician Assistant

## 2018-08-31 ENCOUNTER — Encounter: Payer: Self-pay | Admitting: Physician Assistant

## 2018-08-31 VITALS — BP 148/68 | HR 66 | Temp 97.8°F | Ht 63.0 in | Wt 167.2 lb

## 2018-08-31 DIAGNOSIS — S8992XA Unspecified injury of left lower leg, initial encounter: Secondary | ICD-10-CM

## 2018-08-31 DIAGNOSIS — F800 Phonological disorder: Secondary | ICD-10-CM

## 2018-08-31 DIAGNOSIS — J209 Acute bronchitis, unspecified: Secondary | ICD-10-CM | POA: Diagnosis not present

## 2018-08-31 MED ORDER — DOXYCYCLINE HYCLATE 100 MG PO TABS: 100.0000 mg | ORAL_TABLET | Freq: Two times a day (BID) | ORAL | 0 refills | Status: DC

## 2018-08-31 MED ORDER — PREDNISONE 10 MG (21) PO TBPK: ORAL_TABLET | ORAL | 0 refills | Status: DC

## 2018-08-31 NOTE — Patient Instructions (Signed)
You have bronchitis today and I am sending in doxycycline as an antibiotic and a pack of prednisone. We are going to do an x-ray today and we will call you tomorrow about it.  The prednisone should help the pain in your knee also.

## 2018-09-01 DIAGNOSIS — F800 Phonological disorder: Secondary | ICD-10-CM | POA: Insufficient documentation

## 2018-09-01 NOTE — Progress Notes (Signed)
BP (!) 148/68   Pulse 66   Temp 97.8 F (36.6 C) (Oral)   Ht _0  (1.6 m)   Wt 167 lb 3.2 oz (75.8 kg)   BMI 29.62 kg/m    Subjective:    Patient ID: Kristi Willis, female    DOB: [birth year], 78 y.o.   MRN: [id number]  HPI: Kristi Willis is a 78 y.o. female presenting on 08/31/2018 for Cough and Leg Swelling (left )  Patient comes in today to be seen for cough and congestion that is gone on for several weeks.  She also fell 2 weeks ago and hit her left knee.  She states she has had swelling around it.  She denies having taken any medication for it.  She does have some known issues with joint problems.  She denies any severe fever or chills.  She is not having any bad production with cough.  She was having some face pain around her sinuses.  She does have a little bit of difficulty with her speech and articulation.  Past Medical History:  Diagnosis Date  . Allergy   . Asthma   . Cancer ([place])    skin  . Diabetes mellitus   . GERD (gastroesophageal reflux disease)    hiatal hernia  . Hypercholesteremia   . Hypertension   . Neuromuscular disorder (HCC)    DM neuropathy  . Neuropathy   . SVT (supraventricular tachycardia) (HCC)    Relevant past medical, surgical, family and social history reviewed and updated as indicated. Interim medical history since our last visit reviewed. Allergies and medications reviewed and updated. DATA REVIEWED: CHART IN EPIC  Family History reviewed for pertinent findings.  Review of Systems  Constitutional: Positive for chills and fatigue. Negative for activity change, appetite change and fever.  HENT: Positive for congestion, postnasal drip, sinus pain and sore throat.   Eyes: Negative.   Respiratory: Positive for cough. Negative for wheezing.   Cardiovascular: Negative.  Negative for chest pain, palpitations and leg swelling.  Gastrointestinal: Negative.   Genitourinary: Negative.   Musculoskeletal: Positive for arthralgias  and joint swelling.  Skin: Negative.   Neurological: Positive for headaches.    Allergies as of 08/31/2018      Reactions   Dimetapp C [phenylephrine-bromphen-codeine] Itching   Phenylephrine Hcl Other (See Comments)   unknown   Ultram [tramadol Hcl] Other (See Comments)   Insomnia   Celebrex [celecoxib] Rash   Cortisone Rash   Fenofibrate Rash   Sulfa Antibiotics Rash      Medication List       Accurate as of August 31, 2018 11:59 PM. Always use your most recent med list.        ACCU-CHEK AVIVA PLUS test strip Generic drug:  glucose blood CHECK BLOOD SUGAR UP TO 4 TIMES A DAY OR AS DIRECTED   ACCU-CHEK AVIVA PLUS w/Device Kit CHECK BLOOD SUGAR UP TO 4 TIMES A DAY   ACCU-CHEK SOFTCLIX LANCETS lancets CHECK BLOOD SUGAR UP TO 4 TIMES A DAY OR AS DIRECTED   acyclovir ointment 5 % Commonly known as:  ZOVIRAX Apply 1 application topically every 3 (three) hours. Around mouth, Until symptoms clear   albuterol 108 (90 Base) MCG/ACT inhaler Commonly known as:  VENTOLIN HFA Inhale 2 puffs into the lungs every 6 (six) hours as needed for wheezing.   amLODipine 5 MG tablet Commonly known as:  NORVASC TAKE 1 TABLET DAILY. NEED TO BE SEEN   aspirin 81  MG chewable tablet Chew 81 mg by mouth daily.   azelastine 0.1 % nasal spray Commonly known as:  ASTELIN Place 2 sprays into both nostrils 2 (two) times daily.   CALTRATE 600+D 600-400 MG-UNIT tablet Generic drug:  Calcium Carbonate-Vitamin D Take 1 tablet by mouth 2 (two) times daily.   doxazosin 8 MG tablet Commonly known as:  CARDURA TAKE (1) TABLET DAILY FOR HIGH BLOOD PRESSURE.   doxycycline 100 MG tablet Commonly known as:  VIBRA-TABS Take 1 tablet (100 mg total) by mouth 2 (two) times daily. 1 po bid   EPIPEN 2-PAK 0.3 mg/0.3 mL Soaj injection Generic drug:  EPINEPHrine Reported on 01/24/2016   escitalopram 20 MG tablet Commonly known as:  LEXAPRO Take 1 tablet (20 mg total) by mouth daily.   Fish Oil  1200 MG Caps Take by mouth.   gabapentin 100 MG capsule Commonly known as:  NEURONTIN Take 1 capsule (100 mg total) 3 (three) times daily by mouth.   gabapentin 300 MG capsule Commonly known as:  NEURONTIN   ketoconazole 2 % cream Commonly known as:  NIZORAL APPLY TO THE AFFECTED AREA(S) DAILY AS DIRECTED   lisinopril 40 MG tablet Commonly known as:  PRINIVIL,ZESTRIL TAKE 1 TABLET DAILY   loratadine 10 MG tablet Commonly known as:  CLARITIN Take 1 tablet (10 mg total) by mouth daily.   metFORMIN 1000 MG tablet Commonly known as:  GLUCOPHAGE Take 1 tablet (1,000 mg total) by mouth 2 (two) times daily with a meal.   metoprolol succinate 100 MG 24 hr tablet Commonly known as:  TOPROL-XL TAKE (1) TABLET TWICE A DAY.   montelukast 10 MG tablet Commonly known as:  SINGULAIR Take 1 tablet (10 mg total) by mouth at bedtime.   multivitamin tablet Take 1 tablet by mouth daily.   predniSONE 10 MG (21) Tbpk tablet Commonly known as:  STERAPRED UNI-PAK 21 TAB As directed x 6 days   ranitidine 150 MG tablet Commonly known as:  ZANTAC Take 150 mg by mouth 2 (two) times daily.   rosuvastatin 10 MG tablet Commonly known as:  CRESTOR TAKE 1 TABLET DAILY   sitaGLIPtin 50 MG tablet Commonly known as:  JANUVIA Take 1 tablet (50 mg total) by mouth daily.   vitamin B-12 500 MCG tablet Commonly known as:  CYANOCOBALAMIN Take 500 mcg by mouth daily.   VITAMIN B-6 PO Take 1 tablet by mouth 2 (two) times daily. Reported on 01/24/2016   Vitamin D (Ergocalciferol) 1.25 MG (50000 UT) Caps capsule Commonly known as:  DRISDOL TAKE 1 CAPSULE ONCE A WEEK          Objective:    BP (!) 148/68   Pulse 66   Temp 97.8 F (36.6 C) (Oral)   Ht _0  (1.6 m)   Wt 167 lb 3.2 oz (75.8 kg)   BMI 29.62 kg/m   Allergies  Allergen Reactions  . Dimetapp C [Phenylephrine-Bromphen-Codeine] Itching  . Phenylephrine Hcl Other (See Comments)    unknown  . Ultram [Tramadol Hcl] Other (See  Comments)    Insomnia  . Celebrex [Celecoxib] Rash  . Cortisone Rash  . Fenofibrate Rash  . Sulfa Antibiotics Rash    Wt Readings from Last 3 Encounters:  08/31/18 167 lb 3.2 oz (75.8 kg)  07/06/18 165 lb (74.8 kg)  04/07/18 164 lb (74.4 kg)    Physical Exam Constitutional:      Appearance: She is well-developed.  HENT:     Head: Normocephalic and atraumatic.  Right Ear: Tympanic membrane and external ear normal. No middle ear effusion.     Left Ear: Tympanic membrane and external ear normal.  No middle ear effusion.     Nose: Mucosal edema and rhinorrhea present.     Right Sinus: No maxillary sinus tenderness.     Left Sinus: No maxillary sinus tenderness.     Mouth/Throat:     Pharynx: Uvula midline. Posterior oropharyngeal erythema present.  Eyes:     General:        Right eye: No discharge.        Left eye: No discharge.     Conjunctiva/sclera: Conjunctivae normal.     Pupils: Pupils are equal, round, and reactive to light.  Neck:     Musculoskeletal: Normal range of motion.  Cardiovascular:     Rate and Rhythm: Normal rate and regular rhythm.     Heart sounds: Normal heart sounds.  Pulmonary:     Effort: Pulmonary effort is normal. No respiratory distress.     Breath sounds: Normal breath sounds. No wheezing.  Abdominal:     Palpations: Abdomen is soft.  Musculoskeletal:     Left knee: She exhibits decreased range of motion and swelling. She exhibits no erythema. Tenderness found. Lateral joint line tenderness noted.  Lymphadenopathy:     Cervical: No cervical adenopathy.  Skin:    General: Skin is warm and dry.  Neurological:     Mental Status: She is alert and oriented to person, place, and time.     Results for orders placed or performed in visit on 06/29/18  Comprehensive metabolic panel  Result Value Ref Range   Glucose 164 (H) 65 - 99 mg/dL   BUN 12 8 - 27 mg/dL   Creatinine, Ser 0.60 0.57 - 1.00 mg/dL   GFR calc non Af Amer 89 >59 mL/min/1.73     GFR calc Af Amer 102 >59 mL/min/1.73   BUN/Creatinine Ratio 20 12 - 28   Sodium 139 134 - 144 mmol/L   Potassium 4.4 3.5 - 5.2 mmol/L   Chloride 100 96 - 106 mmol/L   CO2 22 20 - 29 mmol/L   Calcium 9.2 8.7 - 10.3 mg/dL   Total Protein 6.7 6.0 - 8.5 g/dL   Albumin 4.4 3.5 - 4.8 g/dL   Globulin, Total 2.3 1.5 - 4.5 g/dL   Albumin/Globulin Ratio 1.9 1.2 - 2.2   Bilirubin Total 0.3 0.0 - 1.2 mg/dL   Alkaline Phosphatase 50 39 - 117 IU/L   AST 22 0 - 40 IU/L   ALT 18 0 - 32 IU/L  Hemoglobin A1c  Result Value Ref Range   Hgb A1c MFr Bld 7.1 (H) 4.8 - 5.6 %   Est. average glucose Bld gHb Est-mCnc 157 mg/dL      Assessment & Plan:   1. Injury of left knee, initial encounter - predniSONE (STERAPRED UNI-PAK 21 TAB) 10 MG (21) TBPK tablet; As directed x 6 days  Dispense: 21 tablet; Refill: 0 - DG Knee 1-2 Views Left; Future  2. Acute bronchitis, unspecified organism - predniSONE (STERAPRED UNI-PAK 21 TAB) 10 MG (21) TBPK tablet; As directed x 6 days  Dispense: 21 tablet; Refill: 0 - doxycycline (VIBRA-TABS) 100 MG tablet; Take 1 tablet (100 mg total) by mouth 2 (two) times daily. 1 po bid  Dispense: 20 tablet; Refill: 0  3. Speech articulation disorder Follow as needed   Continue all other maintenance medications as listed above.  Follow up plan: No follow-ups  on file.  Educational handout given for [place] PA-C [facility] [address]  [place], [place] [address] [phone]   09/01/2018, 5:31 PM

## 2018-09-07 ENCOUNTER — Other Ambulatory Visit: Payer: Self-pay | Admitting: Family

## 2018-09-08 NOTE — Telephone Encounter (Signed)
Last Vit D 10/03/17  49.8

## 2018-09-24 ENCOUNTER — Other Ambulatory Visit: Payer: Self-pay | Admitting: Family

## 2018-09-25 ENCOUNTER — Telehealth: Payer: Self-pay | Admitting: Family

## 2018-09-25 NOTE — Telephone Encounter (Signed)
Aware.  Needs to be seen for chronic health management visit to get refills.

## 2018-10-02 ENCOUNTER — Other Ambulatory Visit: Payer: Self-pay | Admitting: Neurology

## 2018-10-08 ENCOUNTER — Other Ambulatory Visit: Payer: Self-pay | Admitting: Family

## 2018-10-09 NOTE — Telephone Encounter (Signed)
Last seen 04/16/18

## 2018-10-22 ENCOUNTER — Other Ambulatory Visit: Payer: Self-pay

## 2018-10-22 NOTE — Patient Outreach (Signed)
[facility] Care Management  10/22/2018  Kristi Willis [birth year] [id number]   Medication Adherence call to Kristi Willis Telephone call to Patient regarding Medication Adherence unable to reach patient . Kristi Willis is showing past due under [employer].   [facility] Direct Dial [phone]  Fax [phone] [email or web address]

## 2018-10-26 ENCOUNTER — Other Ambulatory Visit: Payer: Self-pay | Admitting: Family

## 2018-10-27 ENCOUNTER — Telehealth: Payer: Self-pay | Admitting: Family

## 2018-10-27 ENCOUNTER — Other Ambulatory Visit: Payer: Self-pay | Admitting: *Deleted

## 2018-10-27 DIAGNOSIS — E785 Hyperlipidemia, unspecified: Secondary | ICD-10-CM

## 2018-10-27 DIAGNOSIS — I152 Hypertension secondary to endocrine disorders: Secondary | ICD-10-CM

## 2018-10-27 DIAGNOSIS — I10 Essential (primary) hypertension: Secondary | ICD-10-CM

## 2018-10-27 DIAGNOSIS — E1140 Type 2 diabetes mellitus with diabetic neuropathy, unspecified: Secondary | ICD-10-CM

## 2018-10-27 DIAGNOSIS — E1165 Type 2 diabetes mellitus with hyperglycemia: Principal | ICD-10-CM

## 2018-10-27 DIAGNOSIS — E1169 Type 2 diabetes mellitus with other specified complication: Secondary | ICD-10-CM

## 2018-10-27 DIAGNOSIS — E1159 Type 2 diabetes mellitus with other circulatory complications: Secondary | ICD-10-CM

## 2018-10-27 DIAGNOSIS — IMO0002 Reserved for concepts with insufficient information to code with codable children: Secondary | ICD-10-CM

## 2018-10-27 NOTE — Telephone Encounter (Signed)
Aware.  Orders are done.

## 2018-10-29 ENCOUNTER — Telehealth: Payer: Self-pay

## 2018-10-29 ENCOUNTER — Ambulatory Visit: Payer: Medicare Other | Admitting: Family

## 2018-10-29 ENCOUNTER — Other Ambulatory Visit: Payer: Self-pay

## 2018-10-29 ENCOUNTER — Encounter: Payer: Self-pay | Admitting: Family

## 2018-10-29 VITALS — BP 136/52 | HR 61 | Temp 98.1°F | Ht 63.0 in | Wt 162.6 lb

## 2018-10-29 DIAGNOSIS — J4520 Mild intermittent asthma, uncomplicated: Secondary | ICD-10-CM | POA: Diagnosis not present

## 2018-10-29 DIAGNOSIS — E6609 Other obesity due to excess calories: Secondary | ICD-10-CM

## 2018-10-29 DIAGNOSIS — E785 Hyperlipidemia, unspecified: Secondary | ICD-10-CM

## 2018-10-29 DIAGNOSIS — I152 Hypertension secondary to endocrine disorders: Secondary | ICD-10-CM

## 2018-10-29 DIAGNOSIS — I10 Essential (primary) hypertension: Secondary | ICD-10-CM

## 2018-10-29 DIAGNOSIS — E559 Vitamin D deficiency, unspecified: Secondary | ICD-10-CM

## 2018-10-29 DIAGNOSIS — E1142 Type 2 diabetes mellitus with diabetic polyneuropathy: Secondary | ICD-10-CM

## 2018-10-29 DIAGNOSIS — E1165 Type 2 diabetes mellitus with hyperglycemia: Secondary | ICD-10-CM

## 2018-10-29 DIAGNOSIS — E1159 Type 2 diabetes mellitus with other circulatory complications: Secondary | ICD-10-CM | POA: Diagnosis not present

## 2018-10-29 DIAGNOSIS — F331 Major depressive disorder, recurrent, moderate: Secondary | ICD-10-CM

## 2018-10-29 DIAGNOSIS — R5382 Chronic fatigue, unspecified: Secondary | ICD-10-CM

## 2018-10-29 DIAGNOSIS — Z6830 Body mass index (BMI) 30.0-30.9, adult: Secondary | ICD-10-CM

## 2018-10-29 DIAGNOSIS — E1169 Type 2 diabetes mellitus with other specified complication: Secondary | ICD-10-CM

## 2018-10-29 DIAGNOSIS — F411 Generalized anxiety disorder: Secondary | ICD-10-CM

## 2018-10-29 DIAGNOSIS — E1140 Type 2 diabetes mellitus with diabetic neuropathy, unspecified: Secondary | ICD-10-CM | POA: Diagnosis not present

## 2018-10-29 DIAGNOSIS — IMO0002 Reserved for concepts with insufficient information to code with codable children: Secondary | ICD-10-CM

## 2018-10-29 DIAGNOSIS — F800 Phonological disorder: Secondary | ICD-10-CM

## 2018-10-29 DIAGNOSIS — E782 Mixed hyperlipidemia: Secondary | ICD-10-CM

## 2018-10-29 NOTE — Progress Notes (Signed)
Subjective:    Patient ID: Kristi Willis, female    DOB: 1941-06-19, 78 y.o.   MRN: [id number]  Chief Complaint  Patient presents with  . Hypertension  . Depression  . Anxiety   PT presents to the office today for chronic follow up. PT is followed by Neurologists annually for mild cognitive impairment and word finding difficulty. Pt is followed by Endocrinologists every 3 months for uncontrolled DM 2.  Diabetes  She presents for her follow-up diabetic visit. She has type 2 diabetes mellitus. Her disease course has been stable. Hypoglycemia symptoms include nervousness/anxiousness. Associated symptoms include foot paresthesias. Pertinent negatives for diabetes include no visual change. Symptoms are stable. Risk factors for coronary artery disease include dyslipidemia, diabetes mellitus, hypertension, sedentary lifestyle and post-menopausal. Her weight is stable. She is following a generally healthy diet. (Does not check BS at home)  Hypertension  This is a chronic problem. The current episode started more than 1 year ago. The problem has been resolved since onset. The problem is controlled. Associated symptoms include anxiety and malaise/fatigue. Pertinent negatives include no peripheral edema or shortness of breath. Risk factors for coronary artery disease include dyslipidemia, diabetes mellitus and sedentary lifestyle. The current treatment provides moderate improvement.  Hyperlipidemia  This is a chronic problem. The current episode started more than 1 year ago. The problem is uncontrolled. Pertinent negatives include no shortness of breath. Current antihyperlipidemic treatment includes statins. The current treatment provides moderate improvement of lipids. Risk factors for coronary artery disease include diabetes mellitus, dyslipidemia, hypertension, a sedentary lifestyle and post-menopausal.  Anxiety  Presents for follow-up visit. Symptoms include decreased concentration, depressed  mood, excessive worry, insomnia and nervous/anxious behavior. Patient reports no restlessness or shortness of breath. Symptoms occur occasionally. The severity of symptoms is moderate.   Her past medical history is significant for asthma.  Depression         This is a chronic problem.  The current episode started more than 1 year ago.   The onset quality is gradual.   The problem occurs intermittently.  The problem has been waxing and waning since onset.  Associated symptoms include decreased concentration, insomnia, decreased interest and sad.  Associated symptoms include not irritable and no restlessness.  Past medical history includes anxiety.   Asthma  She complains of cough. There is no shortness of breath or wheezing. This is a chronic problem. The current episode started more than 1 year ago. The problem occurs intermittently. The problem has been waxing and waning. Associated symptoms include malaise/fatigue. Her symptoms are alleviated by rest and leukotriene antagonist. She reports moderate improvement on treatment. Her past medical history is significant for asthma.      Review of Systems  Constitutional: Positive for malaise/fatigue.  Respiratory: Positive for cough. Negative for shortness of breath and wheezing.   Psychiatric/Behavioral: Positive for decreased concentration and depression. The patient is nervous/anxious and has insomnia.   All other systems reviewed and are negative.      Objective:   Physical Exam Vitals signs reviewed.  Constitutional:      General: She is not irritable.She is not in acute distress.    Appearance: She is well-developed.  HENT:     Head: Normocephalic and atraumatic.     Right Ear: Tympanic membrane normal.     Left Ear: Tympanic membrane normal.  Eyes:     Pupils: Pupils are equal, round, and reactive to light.  Neck:     Musculoskeletal: Normal range of motion  and neck supple.     Thyroid: No thyromegaly.  Cardiovascular:     Rate  and Rhythm: Normal rate and regular rhythm.     Heart sounds: Normal heart sounds. No murmur.  Pulmonary:     Effort: Pulmonary effort is normal. No respiratory distress.     Breath sounds: Decreased breath sounds present. No wheezing.  Abdominal:     General: Bowel sounds are normal. There is no distension.     Palpations: Abdomen is soft.     Tenderness: There is no abdominal tenderness.  Musculoskeletal: Normal range of motion.        General: No tenderness.  Skin:    General: Skin is warm and dry.  Neurological:     Mental Status: She is alert and oriented to person, place, and time.     Cranial Nerves: No cranial nerve deficit.     Deep Tendon Reflexes: Reflexes are normal and symmetric.  Psychiatric:        Behavior: Behavior normal.        Thought Content: Thought content normal.        Judgment: Judgment normal.     Comments: Difficulty with speech       BP (!) 136/52   Pulse 61   Temp 98.1 F (36.7 C) (Oral)   Ht _0  (1.6 m)   Wt 162 lb 9.6 oz (73.8 kg)   BMI 28.80 kg/m      Assessment & Plan:  Kristi Willis comes in today with chief complaint of Hypertension; Depression; and Anxiety   Diagnosis and orders addressed:  1. Hypertension associated with diabetes ([place]) - CMP14+EGFR - CBC with Differential/Platelet - Microalbumin / creatinine urine ratio  2. Uncontrolled type 2 diabetes mellitus with diabetic neuropathy, without long-term current use of insulin (HCC) - Microalbumin / creatinine urine ratio  3. Essential hypertension  4. Mild intermittent asthma without complication  5. Diabetic polyneuropathy associated with type 2 diabetes mellitus ([place])   6. Hyperlipidemia associated with type 2 diabetes mellitus ([place])  7. Moderate episode of recurrent major depressive disorder ([place])  8. GAD (generalized anxiety disorder)  9. Speech articulation disorder   Labs pending Health Maintenance reviewed Diet and exercise encouraged  Follow  up plan: 6 months    Evelina Dun, FNP

## 2018-10-29 NOTE — Telephone Encounter (Signed)
Kristi Willis, CMA  

## 2018-10-30 LAB — CBC WITH DIFFERENTIAL/PLATELET
Basophils Absolute: 0 10*3/uL (ref 0.0–0.2)
Basos: 1 %
EOS (ABSOLUTE): 0.5 10*3/uL — ABNORMAL HIGH (ref 0.0–0.4)
Eos: 6 %
Hematocrit: 34.2 % (ref 34.0–46.6)
Hemoglobin: 11.4 g/dL (ref 11.1–15.9)
Immature Grans (Abs): 0 10*3/uL (ref 0.0–0.1)
Immature Granulocytes: 0 %
LYMPHS ABS: 3 10*3/uL (ref 0.7–3.1)
Lymphs: 37 %
MCH: 28.6 pg (ref 26.6–33.0)
MCHC: 33.3 g/dL (ref 31.5–35.7)
MCV: 86 fL (ref 79–97)
Monocytes Absolute: 0.6 10*3/uL (ref 0.1–0.9)
Monocytes: 8 %
NEUTROS ABS: 3.8 10*3/uL (ref 1.4–7.0)
Neutrophils: 48 %
Platelets: 243 10*3/uL (ref 150–450)
RBC: 3.98 x10E6/uL (ref 3.77–5.28)
RDW: 13.4 % (ref 11.7–15.4)
WBC: 7.9 10*3/uL (ref 3.4–10.8)

## 2018-10-30 LAB — CMP14+EGFR
A/G RATIO: 2.3 — AB (ref 1.2–2.2)
ALT: 19 IU/L (ref 0–32)
AST: 21 IU/L (ref 0–40)
Albumin: 4.3 g/dL (ref 3.7–4.7)
Alkaline Phosphatase: 44 IU/L (ref 39–117)
BILIRUBIN TOTAL: 0.3 mg/dL (ref 0.0–1.2)
BUN / CREAT RATIO: 18 (ref 12–28)
BUN: 9 mg/dL (ref 8–27)
CALCIUM: 8.9 mg/dL (ref 8.7–10.3)
CO2: 22 mmol/L (ref 20–29)
Chloride: 102 mmol/L (ref 96–106)
Creatinine, Ser: 0.51 mg/dL — ABNORMAL LOW (ref 0.57–1.00)
GFR calc Af Amer: 107 mL/min/{1.73_m2} (ref >59)
GFR calc non Af Amer: 93 mL/min/{1.73_m2} (ref >59)
Globulin, Total: 1.9 g/dL (ref 1.5–4.5)
Glucose: 154 mg/dL — ABNORMAL HIGH (ref 65–99)
Potassium: 4.2 mmol/L (ref 3.5–5.2)
SODIUM: 142 mmol/L (ref 134–144)
Total Protein: 6.2 g/dL (ref 6.0–8.5)

## 2018-10-30 LAB — MICROALBUMIN / CREATININE URINE RATIO
Creatinine, Urine: 43.7 mg/dL
Microalb/Creat Ratio: 26 mg/g creat (ref 0–29)
Microalbumin, Urine: 11.3 ug/mL

## 2018-10-31 LAB — TSH: TSH: 6.12 u[IU]/mL — ABNORMAL HIGH (ref 0.450–4.500)

## 2018-10-31 LAB — LIPID PANEL
Chol/HDL Ratio: 4.3 ratio (ref 0.0–4.4)
Cholesterol, Total: 178 mg/dL (ref 100–199)
HDL: 41 mg/dL (ref >39)
LDL Calculated: 88 mg/dL (ref 0–99)
Triglycerides: 243 mg/dL — ABNORMAL HIGH (ref 0–149)
VLDL CHOLESTEROL CAL: 49 mg/dL — AB (ref 5–40)

## 2018-10-31 LAB — SPECIMEN STATUS REPORT

## 2018-10-31 LAB — HGB A1C W/O EAG: Hgb A1c MFr Bld: 7.1 % — ABNORMAL HIGH (ref 4.8–5.6)

## 2018-10-31 LAB — T4, FREE: Free T4: 1.11 ng/dL (ref 0.82–1.77)

## 2018-10-31 LAB — VITAMIN D 25 HYDROXY (VIT D DEFICIENCY, FRACTURES): Vit D, 25-Hydroxy: 34.9 ng/mL (ref 30.0–100.0)

## 2018-11-03 ENCOUNTER — Other Ambulatory Visit: Payer: Self-pay | Admitting: Family

## 2018-11-03 MED ORDER — LEVOTHYROXINE SODIUM 50 MCG PO TABS: 50.0000 ug | ORAL_TABLET | Freq: Every day | ORAL | 3 refills | Status: DC

## 2018-11-05 ENCOUNTER — Ambulatory Visit: Payer: Medicare Other | Admitting: "Endocrinology

## 2018-11-09 ENCOUNTER — Telehealth: Payer: Self-pay

## 2018-11-09 NOTE — Telephone Encounter (Signed)
Spoke with pt's daughter, [name], advising that we are currently not seeing pt's in the office due to COVID-19.  [name] states that due to pt's aphasia she does not believe telephonic/Webex visit appropriate.  Asked that pt be called once we know when we are seeing pt's in office again.

## 2018-11-13 ENCOUNTER — Ambulatory Visit: Payer: Medicare Other | Admitting: Neurology

## 2018-12-08 ENCOUNTER — Other Ambulatory Visit: Payer: Self-pay | Admitting: *Deleted

## 2018-12-08 MED ORDER — LISINOPRIL 40 MG PO TABS: 40.0000 mg | ORAL_TABLET | Freq: Every day | ORAL | 1 refills | Status: DC

## 2019-01-01 ENCOUNTER — Other Ambulatory Visit: Payer: Self-pay | Admitting: Neurology

## 2019-01-01 ENCOUNTER — Encounter: Payer: Medicare Other | Admitting: *Deleted

## 2019-01-18 ENCOUNTER — Other Ambulatory Visit: Payer: Self-pay | Admitting: Family

## 2019-01-26 ENCOUNTER — Ambulatory Visit (INDEPENDENT_AMBULATORY_CARE_PROVIDER_SITE_OTHER): Payer: Medicare Other | Admitting: Family Medicine

## 2019-01-26 ENCOUNTER — Other Ambulatory Visit: Payer: Self-pay

## 2019-01-26 DIAGNOSIS — J40 Bronchitis, not specified as acute or chronic: Secondary | ICD-10-CM | POA: Diagnosis not present

## 2019-01-26 MED ORDER — BENZONATATE 100 MG PO CAPS: 100.0000 mg | ORAL_CAPSULE | Freq: Three times a day (TID) | ORAL | 0 refills | Status: DC | PRN

## 2019-01-26 MED ORDER — PREDNISONE 20 MG PO TABS: 40.0000 mg | ORAL_TABLET | Freq: Every day | ORAL | 0 refills | Status: AC

## 2019-01-26 MED ORDER — DOXYCYCLINE HYCLATE 100 MG PO TABS: 100.0000 mg | ORAL_TABLET | Freq: Two times a day (BID) | ORAL | 0 refills | Status: AC

## 2019-01-26 NOTE — Progress Notes (Signed)
Telephone visit  Subjective: CC: cough PCP: Sharion Balloon, FNP JKD:[id number] Kristi Willis is Kristi 78 y.o. female calls for telephone consult today. Patient provides verbal consent for consult held via phone.  Location of patient: home Location of provider: Working remotely from home Others present for call: daughter  1. Cough Daughter provides much of the history, as her mother has problems with aphasia.  She reports onset of cough Saturday.  Cough is getting worse and productive.  She denies fevers.  No hemoptysis.  She denies wheezing.  She reports rhinorrhea.  No congestion, diarrhea, nausea or vomiting.  She notes this tends to occur every year usually 1-2 times per year and the patient typically needs an antibiotic and cough medication in order for it to resolve.  No known sick contacts, including persons infected with COVID-19.   ROS: Per HPI  Allergies  Allergen Reactions  . Dimetapp C [Phenylephrine-Bromphen-Codeine] Itching  . Phenylephrine Hcl Other (See Comments)    unknown  . Ultram [Tramadol Hcl] Other (See Comments)    Insomnia  . Celebrex [Celecoxib] Rash  . Cortisone Rash  . Fenofibrate Rash  . Sulfa Antibiotics Rash   Past Medical History:  Diagnosis Date  . Allergy   . Asthma   . Cancer ([place])    skin  . Diabetes mellitus   . GERD (gastroesophageal reflux disease)    hiatal hernia  . Hypercholesteremia   . Hypertension   . Neuromuscular disorder (HCC)    DM neuropathy  . Neuropathy   . SVT (supraventricular tachycardia) (HCC)     Current Outpatient Medications:  .  ACCU-CHEK AVIVA PLUS test strip, CHECK BLOOD SUGAR UP TO 4 TIMES Kristi DAY OR AS DIRECTED, Disp: 100 each, Rfl: 1 .  ACCU-CHEK SOFTCLIX LANCETS lancets, CHECK BLOOD SUGAR UP TO 4 TIMES Kristi DAY OR AS DIRECTED, Disp: 100 each, Rfl: 1 .  acyclovir ointment (ZOVIRAX) 5 %, Apply 1 application topically every 3 (three) hours. Around mouth, Until symptoms clear, Disp: 15 g, Rfl: 5 .  albuterol (VENTOLIN  HFA) 108 (90 BASE) MCG/ACT inhaler, Inhale 2 puffs into the lungs every 6 (six) hours as needed for wheezing., Disp: 1 Inhaler, Rfl: 3 .  amLODipine (NORVASC) 5 MG tablet, TAKE 1 TABLET DAILY., Disp: 30 tablet, Rfl: 0 .  aspirin 81 MG chewable tablet, Chew 81 mg by mouth daily., Disp: , Rfl:  .  azelastine (ASTELIN) 0.1 % nasal spray, Place 2 sprays into both nostrils 2 (two) times daily., Disp: 30 mL, Rfl: 11 .  Blood Glucose Monitoring Suppl (ACCU-CHEK AVIVA PLUS) w/Device KIT, CHECK BLOOD SUGAR UP TO 4 TIMES Kristi DAY, Disp: 1 kit, Rfl: 0 .  Calcium Carbonate-Vitamin D (CALTRATE 600+D) 600-400 MG-UNIT per tablet, Take 1 tablet by mouth 2 (two) times daily., Disp: , Rfl:  .  doxazosin (CARDURA) 8 MG tablet, TAKE (1) TABLET DAILY FOR HIGH BLOOD PRESSURE., Disp: 90 tablet, Rfl: 0 .  EPIPEN 2-PAK 0.3 MG/0.3ML SOAJ injection, Reported on 01/24/2016, Disp: , Rfl:  .  escitalopram (LEXAPRO) 20 MG tablet, Take 1 tablet (20 mg total) by mouth daily., Disp: 90 tablet, Rfl: 0 .  gabapentin (NEURONTIN) 100 MG capsule, Take 1 capsule (100 mg total) 3 (three) times daily by mouth., Disp: 90 capsule, Rfl: 3 .  gabapentin (NEURONTIN) 300 MG capsule, , Disp: , Rfl:  .  ketoconazole (NIZORAL) 2 % cream, APPLY TO THE AFFECTED AREA(S) DAILY AS DIRECTED, Disp: 15 g, Rfl: 0 .  levothyroxine (SYNTHROID, LEVOTHROID) 50  MCG tablet, Take 1 tablet (50 mcg total) by mouth daily., Disp: 90 tablet, Rfl: 3 .  lisinopril (ZESTRIL) 40 MG tablet, Take 1 tablet (40 mg total) by mouth daily., Disp: 90 tablet, Rfl: 1 .  loratadine (CLARITIN) 10 MG tablet, Take 1 tablet (10 mg total) by mouth daily., Disp: 30 tablet, Rfl: 11 .  metFORMIN (GLUCOPHAGE) 1000 MG tablet, Take 1 tablet (1,000 mg total) by mouth 2 (two) times daily with Kristi meal., Disp: 180 tablet, Rfl: 1 .  metoprolol succinate (TOPROL-XL) 100 MG 24 hr tablet, TAKE (1) TABLET TWICE Kristi DAY., Disp: 180 tablet, Rfl: 0 .  montelukast (SINGULAIR) 10 MG tablet, Take 1 tablet (10 mg  total) by mouth at bedtime., Disp: 30 tablet, Rfl: 5 .  Multiple Vitamin (MULTIVITAMIN) tablet, Take 1 tablet by mouth daily.  , Disp: , Rfl:  .  Omega-3 Fatty Acids (FISH OIL) 1200 MG CAPS, Take by mouth., Disp: , Rfl:  .  Pyridoxine HCl (VITAMIN B-6 PO), Take 1 tablet by mouth 2 (two) times daily. Reported on 01/24/2016, Disp: , Rfl:  .  rosuvastatin (CRESTOR) 10 MG tablet, TAKE 1 TABLET DAILY, Disp: 90 tablet, Rfl: 0 .  sitaGLIPtin (JANUVIA) 50 MG tablet, Take 1 tablet (50 mg total) by mouth daily., Disp: 90 tablet, Rfl: 1 .  vitamin B-12 (CYANOCOBALAMIN) 500 MCG tablet, Take 500 mcg by mouth daily.  , Disp: , Rfl:  .  Vitamin D, Ergocalciferol, (DRISDOL) 1.25 MG (50000 UT) CAPS capsule, TAKE 1 CAPSULE ONCE Kristi WEEK, Disp: 12 capsule, Rfl: 0  Assessment/ Plan: 78 y.o. female   1. Bronchitis We will treat empirically for bronchitis.  I reviewed her last A1c which was at goal for age at 7.1.  Short course of prednisone prescribed.  Tessalon Perles prescribed.  And lieu of Z-Pak, which was initially requested by her daughter, I sent in doxycycline.  I reviewed patient's last EKG and she had Kristi slightly prolonged QTC.  Given age and QTC prolongation would not recommend using Z-Pak in this patient unless she has Kristi repeat EKG that shows normalization of QTC.  We discussed reasons for urgent and emergent evaluation.  Her daughter voiced good understanding will follow-up PRN - predniSONE (DELTASONE) 20 MG tablet; Take 2 tablets (40 mg total) by mouth daily with breakfast for 3 days.  Dispense: 6 tablet; Refill: 0 - benzonatate (TESSALON PERLES) 100 MG capsule; Take 1 capsule (100 mg total) by mouth 3 (three) times daily as needed.  Dispense: 20 capsule; Refill: 0 - doxycycline (VIBRA-TABS) 100 MG tablet; Take 1 tablet (100 mg total) by mouth 2 (two) times daily for 7 days.  Dispense: 14 tablet; Refill: 0   Start time: 9:22am End time: 9:31am  Total time spent on patient care (including telephone call/  virtual visit): 16 minutes  [place], [place] [phone]

## 2019-02-11 ENCOUNTER — Telehealth: Payer: Self-pay | Admitting: Family

## 2019-02-11 NOTE — Telephone Encounter (Signed)
Spoke with pt's daughter regarding rash Pt will try OTC Will call for appt if no improvement

## 2019-02-12 ENCOUNTER — Encounter: Payer: Self-pay | Admitting: Family

## 2019-02-12 ENCOUNTER — Ambulatory Visit (INDEPENDENT_AMBULATORY_CARE_PROVIDER_SITE_OTHER): Payer: Medicare Other | Admitting: Family

## 2019-02-12 ENCOUNTER — Other Ambulatory Visit: Payer: Self-pay

## 2019-02-12 DIAGNOSIS — M5136 Other intervertebral disc degeneration, lumbar region: Secondary | ICD-10-CM

## 2019-02-12 DIAGNOSIS — E039 Hypothyroidism, unspecified: Secondary | ICD-10-CM | POA: Insufficient documentation

## 2019-02-12 DIAGNOSIS — F800 Phonological disorder: Secondary | ICD-10-CM

## 2019-02-12 DIAGNOSIS — J4520 Mild intermittent asthma, uncomplicated: Secondary | ICD-10-CM

## 2019-02-12 DIAGNOSIS — E1140 Type 2 diabetes mellitus with diabetic neuropathy, unspecified: Secondary | ICD-10-CM | POA: Diagnosis not present

## 2019-02-12 DIAGNOSIS — F411 Generalized anxiety disorder: Secondary | ICD-10-CM

## 2019-02-12 DIAGNOSIS — IMO0002 Reserved for concepts with insufficient information to code with codable children: Secondary | ICD-10-CM

## 2019-02-12 DIAGNOSIS — E785 Hyperlipidemia, unspecified: Secondary | ICD-10-CM

## 2019-02-12 DIAGNOSIS — I10 Essential (primary) hypertension: Secondary | ICD-10-CM | POA: Diagnosis not present

## 2019-02-12 DIAGNOSIS — E1165 Type 2 diabetes mellitus with hyperglycemia: Secondary | ICD-10-CM

## 2019-02-12 DIAGNOSIS — F331 Major depressive disorder, recurrent, moderate: Secondary | ICD-10-CM

## 2019-02-12 DIAGNOSIS — E1169 Type 2 diabetes mellitus with other specified complication: Secondary | ICD-10-CM

## 2019-02-12 DIAGNOSIS — G4700 Insomnia, unspecified: Secondary | ICD-10-CM

## 2019-02-12 NOTE — Progress Notes (Signed)
Virtual Visit via telephone Note  I connected with Kristi Willis on 02/12/19 at 9:13 AM by telephone and verified that I am speaking with the correct person using two identifiers. Kristi Willis is currently located at care and daughter is currently with her during visit. The provider, Evelina Dun, FNP is located in their office at time of visit.  I discussed the limitations, risks, security and privacy concerns of performing an evaluation and management service by telephone and the availability of in person appointments. I also discussed with the patient that there may be a patient responsible charge related to this service. The patient expressed understanding and agreed to proceed.   History and Present Illness:  Pt calls the office today for chronic follow up.  Hypertension This is a chronic problem. The current episode started more than 1 year ago. The problem has been waxing and waning since onset. The problem is controlled. Associated symptoms include anxiety. Pertinent negatives include no blurred vision, malaise/fatigue, peripheral edema or shortness of breath. The current treatment provides moderate improvement. There is no history of CVA or heart failure. Identifiable causes of hypertension include a thyroid problem.  Asthma She complains of cough. There is no hemoptysis, shortness of breath or wheezing. This is a chronic problem. The current episode started more than 1 year ago. The problem occurs intermittently. Pertinent negatives include no malaise/fatigue. She reports moderate improvement on treatment. Her symptoms are not alleviated by rest. Her past medical history is significant for asthma.  Diabetes She presents for her follow-up diabetic visit. She has type 2 diabetes mellitus. Her disease course has been stable. Hypoglycemia symptoms include nervousness/anxiousness. Associated symptoms include fatigue and foot paresthesias. Pertinent negatives for diabetes include  no blurred vision and no visual change. There are no hypoglycemic complications. Symptoms are stable. Pertinent negatives for diabetic complications include no CVA. Risk factors for coronary artery disease include dyslipidemia, diabetes mellitus, hypertension, sedentary lifestyle and post-menopausal. She is following a diabetic diet. (Does not check BS at home) Eye exam is not current.  Insomnia Primary symptoms: difficulty falling asleep, frequent awakening, no malaise/fatigue.  The current episode started more than one year. The onset quality is gradual. The problem occurs intermittently. The problem has been waxing and waning since onset. PMH includes: depression.  Anxiety Presents for follow-up visit. Symptoms include decreased concentration, depressed mood, excessive worry, insomnia, irritability, nervous/anxious behavior, panic and restlessness. Patient reports no shortness of breath. Symptoms occur most days. The severity of symptoms is moderate. The quality of sleep is good.   Her past medical history is significant for asthma.  Depression        This is a chronic problem.  The current episode started more than 1 year ago.   The onset quality is gradual.   The problem occurs intermittently.  Associated symptoms include decreased concentration, fatigue, insomnia, irritable, restlessness, decreased interest and sad.  Associated symptoms include no helplessness and no hopelessness.  Past medical history includes thyroid problem and anxiety.   Hyperlipidemia This is a chronic problem. The current episode started more than 1 year ago. The problem is controlled. Recent lipid tests were reviewed and are normal. Exacerbating diseases include obesity. Pertinent negatives include no shortness of breath. Current antihyperlipidemic treatment includes statins. The current treatment provides moderate improvement of lipids. Risk factors for coronary artery disease include dyslipidemia, diabetes mellitus,  hypertension and a sedentary lifestyle.  Thyroid Problem Presents for follow-up visit. Symptoms include anxiety, depressed mood and fatigue. Patient reports  no visual change. The symptoms have been stable. Her past medical history is significant for hyperlipidemia. There is no history of heart failure.  Diabetic Neuropathy PT complaining of bilateral numbness in bilateral feet of 3 out 10.    Review of Systems  Constitutional: Positive for fatigue and irritability. Negative for malaise/fatigue.  Eyes: Negative for blurred vision.  Respiratory: Positive for cough. Negative for hemoptysis, shortness of breath and wheezing.   Psychiatric/Behavioral: Positive for decreased concentration and depression. The patient is nervous/anxious and has insomnia.   All other systems reviewed and are negative.    Observations/Objective: No SOB or distress noted, her daughter did most of the talking because of her aphasia   Assessment and Plan: [name] comes in today with chief complaint of No chief complaint on file.   Diagnosis and orders addressed:  1. Essential hypertension - CMP14+EGFR; Future - CBC with Differential/Platelet; Future  2. Mild intermittent asthma without complication - BHA19+FXTK; Future - CBC with Differential/Platelet; Future  3. Uncontrolled type 2 diabetes mellitus with diabetic neuropathy, without long-term current use of insulin (HCC) - CMP14+EGFR; Future - CBC with Differential/Platelet; Future - Bayer DCA Hb A1c Waived; Future  4. Hyperlipidemia associated with type 2 diabetes mellitus (HCC) - CMP14+EGFR; Future - CBC with Differential/Platelet; Future - Lipid panel; Future  5. Moderate episode of recurrent major depressive disorder (HCC) - CMP14+EGFR; Future - CBC with Differential/Platelet; Future  6. GAD (generalized anxiety disorder) - CMP14+EGFR; Future - CBC with Differential/Platelet; Future  7. Insomnia, unspecified type - CMP14+EGFR;  Future - CBC with Differential/Platelet; Future  8. Speech articulation disorder - CMP14+EGFR; Future - CBC with Differential/Platelet; Future  9. Degenerative disc disease, lumbar - CMP14+EGFR; Future - CBC with Differential/Platelet; Future  10. Hypothyroidism, unspecified type - CMP14+EGFR; Future - CBC with Differential/Platelet; Future - TSH; Future   Labs pending Health Maintenance reviewed- She will schedule Diabetic eye exam Diet and exercise encouraged  Follow up plan: 4 months     I discussed the assessment and treatment plan with the patient. The patient was provided an opportunity to ask questions and all were answered. The patient agreed with the plan and demonstrated an understanding of the instructions.   The patient was advised to call back or seek an in-person evaluation if the symptoms worsen or if the condition fails to improve as anticipated.  The above assessment and management plan was discussed with the patient. The patient verbalized understanding of and has agreed to the management plan. Patient is aware to call the clinic if symptoms persist or worsen. Patient is aware when to return to the clinic for a follow-up visit. Patient educated on when it is appropriate to go to the emergency department.   Time call ended:  9:31 AM  I provided 18 minutes of non-face-to-face time during this encounter.    Evelina Dun, FNP

## 2019-02-15 ENCOUNTER — Other Ambulatory Visit (HOSPITAL_COMMUNITY): Payer: Self-pay | Admitting: Family

## 2019-02-15 DIAGNOSIS — Z1231 Encounter for screening mammogram for malignant neoplasm of breast: Secondary | ICD-10-CM

## 2019-02-16 ENCOUNTER — Encounter: Payer: Self-pay | Admitting: *Deleted

## 2019-02-17 ENCOUNTER — Other Ambulatory Visit: Payer: Self-pay | Admitting: Family

## 2019-02-17 ENCOUNTER — Ambulatory Visit (INDEPENDENT_AMBULATORY_CARE_PROVIDER_SITE_OTHER): Payer: Medicare Other | Admitting: *Deleted

## 2019-02-17 ENCOUNTER — Other Ambulatory Visit: Payer: Self-pay

## 2019-02-17 VITALS — Ht 63.0 in | Wt 162.6 lb

## 2019-02-17 DIAGNOSIS — Z0000 Encounter for general adult medical examination without abnormal findings: Secondary | ICD-10-CM

## 2019-02-17 NOTE — Patient Instructions (Signed)
  Kristi Willis , Thank you for taking time to come for your Medicare Wellness Visit. I appreciate your ongoing commitment to your health goals. Please review the following plan we discussed and let me know if I can assist you in the future.   These are the goals we discussed: Goals    . Decrease soda or juice intake     Decrease intake of diet mountain dew      . Exercise 3x per week (30 min per time)     Continue to walk 3 times a week for 30 minutes    . Plan meals     Try to eat vegetables, lean proteins, and limit amount of fried foods        This is a list of the screening recommended for you and due dates:  Health Maintenance  Topic Date Due  . Eye exam for diabetics  01/22/2018  . DEXA scan (bone density measurement)  06/19/2018  . Complete foot exam   07/03/2018  . Flu Shot  03/20/2019  . Hemoglobin A1C  05/01/2019  . Tetanus Vaccine  02/01/2025  . Pneumonia vaccines  Completed

## 2019-02-17 NOTE — Progress Notes (Signed)
MEDICARE ANNUAL WELLNESS VISIT  02/17/2019  Telephone Visit Disclaimer This Medicare AWV was conducted by telephone due to national recommendations for restrictions regarding the COVID-19 Pandemic (e.g. social distancing).  I verified, using two identifiers, that I am speaking with Kristi Willis or their authorized healthcare agent. I discussed the limitations, risks, security, and privacy concerns of performing an evaluation and management service by telephone and the potential availability of an in-person appointment in the future. The patient expressed understanding and agreed to proceed.   Subjective:  Kristi Willis is a 78 y.o. female patient of Hawks, Theador Hawthorne, FNP who had a Medicare Annual Wellness Visit today via telephone. Kristi Willis is Retired and lives alone. she has 1 child. she reports that she is socially active and does interact with friends/family regularly. she is not physically active and enjoys gardening.  Patient Care Team: Sharion Balloon, FNP as PCP - General (Family Medicine) Deboraha Sprang, MD as Consulting Physician (Cardiology) Monna Fam, MD as Consulting Physician (Ophthalmology) Cameron Sprang, MD as Consulting Physician (Neurology)  Advanced Directives 02/17/2019 11/21/2017 10/10/2016 06/05/2016 02/04/2016 12/22/2014  Does Patient Have a Medical Advance Directive? Yes Yes No No No No  Type of Paramedic of [place];Living will - - - -  Does patient want to make changes to medical advance directive? No - Patient declined No - Patient declined - - - -  Copy of [place] in Chart? No - copy requested No - copy requested - - - -  Would patient like information on creating a medical advance directive? - - - No - patient declined information No - patient declined information Yes - Educational materials given    Hospital Utilization Over the Past 12 Months: # of hospitalizations  or ER visits: 0 # of surgeries: 0  Review of Systems    Patient reports that her overall health is unchanged compared to last year.  Patient Reported Readings (BP, Pulse, CBG, Weight, etc) none  Review of Systems: History obtained from child, chart review and the patient General ROS: negative  All other systems negative.  Pain Assessment Pain : No/denies pain     Current Medications & Allergies (verified) Allergies as of 02/17/2019      Reactions   Dimetapp C [phenylephrine-bromphen-codeine] Itching   Phenylephrine Hcl Other (See Comments)   unknown   Ultram [tramadol Hcl] Other (See Comments)   Insomnia   Celebrex [celecoxib] Rash   Cortisone Rash   Fenofibrate Rash   Sulfa Antibiotics Rash      Medication List       Accurate as of February 17, 2019  1:48 PM. If you have any questions, ask your nurse or doctor.        Accu-Chek Aviva Plus test strip Generic drug: glucose blood CHECK BLOOD SUGAR UP TO 4 TIMES A DAY OR AS DIRECTED   Accu-Chek Aviva Plus w/Device Kit CHECK BLOOD SUGAR UP TO 4 TIMES A DAY   Accu-Chek Softclix Lancets lancets CHECK BLOOD SUGAR UP TO 4 TIMES A DAY OR AS DIRECTED   acyclovir ointment 5 % Commonly known as: ZOVIRAX Apply 1 application topically every 3 (three) hours. Around mouth, Until symptoms clear   albuterol 108 (90 Base) MCG/ACT inhaler Commonly known as: Ventolin HFA Inhale 2 puffs into the lungs every 6 (six) hours as needed for wheezing.   amLODipine 5 MG tablet Commonly known as: NORVASC TAKE 1 TABLET DAILY.  aspirin 81 MG chewable tablet Chew 81 mg by mouth daily.   azelastine 0.1 % nasal spray Commonly known as: ASTELIN Place 2 sprays into both nostrils 2 (two) times daily.   Caltrate 600+D 600-400 MG-UNIT tablet Generic drug: Calcium Carbonate-Vitamin D Take 1 tablet by mouth 2 (two) times daily.   doxazosin 8 MG tablet Commonly known as: CARDURA TAKE (1) TABLET DAILY FOR HIGH BLOOD PRESSURE.   EpiPen  2-Pak 0.3 mg/0.3 mL Soaj injection Generic drug: EPINEPHrine Reported on 01/24/2016   escitalopram 20 MG tablet Commonly known as: LEXAPRO Take 1 tablet (20 mg total) by mouth daily.   Fish Oil 1200 MG Caps Take by mouth.   gabapentin 100 MG capsule Commonly known as: NEURONTIN Take 1 capsule (100 mg total) 3 (three) times daily by mouth.   gabapentin 300 MG capsule Commonly known as: NEURONTIN   ketoconazole 2 % cream Commonly known as: NIZORAL APPLY TO THE AFFECTED AREA(S) DAILY AS DIRECTED   levothyroxine 50 MCG tablet Commonly known as: SYNTHROID Take 1 tablet (50 mcg total) by mouth daily.   lisinopril 40 MG tablet Commonly known as: ZESTRIL Take 1 tablet (40 mg total) by mouth daily.   loratadine 10 MG tablet Commonly known as: CLARITIN Take 1 tablet (10 mg total) by mouth daily.   metFORMIN 1000 MG tablet Commonly known as: GLUCOPHAGE Take 1 tablet (1,000 mg total) by mouth 2 (two) times daily with a meal.   metoprolol succinate 100 MG 24 hr tablet Commonly known as: TOPROL-XL TAKE (1) TABLET TWICE A DAY.   montelukast 10 MG tablet Commonly known as: SINGULAIR Take 1 tablet (10 mg total) by mouth at bedtime.   multivitamin tablet Take 1 tablet by mouth daily.   rosuvastatin 10 MG tablet Commonly known as: CRESTOR TAKE 1 TABLET DAILY   sitaGLIPtin 50 MG tablet Commonly known as: Januvia Take 1 tablet (50 mg total) by mouth daily.   vitamin B-12 500 MCG tablet Commonly known as: CYANOCOBALAMIN Take 500 mcg by mouth daily.   VITAMIN B-6 PO Take 1 tablet by mouth 2 (two) times daily. Reported on 01/24/2016   Vitamin D (Ergocalciferol) 1.25 MG (50000 UT) Caps capsule Commonly known as: DRISDOL TAKE 1 CAPSULE ONCE A WEEK       History (reviewed): Past Medical History:  Diagnosis Date  . Allergy   . Asthma   . Cancer ([place])    skin  . Diabetes mellitus   . GERD (gastroesophageal reflux disease)    hiatal hernia  . Hypercholesteremia   .  Hypertension   . Neuromuscular disorder ([facility])    DM neuropathy  . Neuropathy   . SVT (supraventricular tachycardia) ([facility])    Past Surgical History:  Procedure Laterality Date  . BREAST BIOPSY     left breast biopsy  . LUMBAR LAMINECTOMY  08/10  . SKIN CANCER EXCISION  2005  . SPINE SURGERY     lumbar laminectomy   Family History  Problem Relation Age of Onset  . Cancer Mother 85       cervical cancer  . Depression Mother   . Heart disease Father   . Stroke Father   . Cancer Father        lung cancer  . Heart disease Sister 87       MI  . Hypertension Sister   . Cancer Sister 59       breast cancer  . Healthy Daughter    Social History   Socioeconomic History  .  Marital status: Widowed    Spouse name: Not on file  . Number of children: 1  . Years of education: Not on file  . Highest education level: 12th grade  Occupational History  . Occupation: Retired from [employer] but still works part-time  Social Needs  . Financial resource strain: Not hard at all  . Food insecurity    Worry: Never true    Inability: Never true  . Transportation needs    Medical: No    Non-medical: No  Tobacco Use  . Smoking status: Never Smoker  . Smokeless tobacco: Never Used  Substance and Sexual Activity  . Alcohol use: No  . Drug use: No  . Sexual activity: Never  Lifestyle  . Physical activity    Days per week: 0 days    Minutes per session: 0 min  . Stress: Not at all  Relationships  . Social connections    Talks on phone: More than three times a week    Gets together: More than three times a week    Attends religious service: More than 4 times per year    Active member of club or organization: Yes    Attends meetings of clubs or organizations: More than 4 times per year    Relationship status: Widowed  Other Topics Concern  . Not on file  Social History Narrative   Lives with husband, who is a patient of Dr Lovena Le. She is active around the house and exercises  regularly.    Activities of Daily Living In your present state of health, do you have any difficulty performing the following activities: 02/17/2019  Hearing? Y  Vision? Y  Difficulty concentrating or making decisions? N  Walking or climbing stairs? N  Dressing or bathing? N  Doing errands, shopping? N  Preparing Food and eating ? N  Using the Toilet? N  In the past six months, have you accidently leaked urine? N  Do you have problems with loss of bowel control? N  Managing your Medications? N  Managing your Finances? N  Housekeeping or managing your Housekeeping? N  Some recent data might be hidden    Patient Literacy How often do you need to have someone help you when you read instructions, pamphlets, or other written materials from your doctor or pharmacy?: 1 - Never What is the last grade level you completed in school?: 12th Grade  Exercise Current Exercise Habits: The patient does not participate in regular exercise at present, Exercise limited by: None identified  Diet Patient reports consuming 3 meals a day and 0 snack(s) a day Patient reports that her primary diet is: Regular Patient reports that she does have regular access to food.   Depression Screen PHQ 2/9 Scores 02/17/2019 10/29/2018 08/31/2018 07/06/2018 02/23/2018 02/04/2018 12/30/2017  PHQ - 2 Score 0 2 0 0 0 0 1  PHQ- 9 Score - 6 - - - - -     Fall Risk Fall Risk  02/17/2019 10/29/2018 08/31/2018 07/06/2018 03/23/2018  Falls in the past year? '1 1 1 ' 0 No  Number falls in past yr: 1 0 0 - -  Injury with Fall? 0 0 0 - -  Risk for fall due to : History of fall(s);Impaired balance/gait - - - -     Objective:  Kristi Willis seemed alert and oriented and she participated appropriately during our telephone visit.  Blood Pressure Weight BMI  BP Readings from Last 3 Encounters:  10/29/18 (!) 136/52  08/31/18 (!) 148/68  07/06/18 137/68   Wt Readings from Last 3 Encounters:  02/17/19 162 lb 9.6 oz (73.8 kg)   10/29/18 162 lb 9.6 oz (73.8 kg)  08/31/18 167 lb 3.2 oz (75.8 kg)   BMI Readings from Last 1 Encounters:  02/17/19 28.80 kg/m    *Unable to obtain current vital signs, weight, and BMI due to telephone visit type  Hearing/Vision  . Kristi Willis did  seem to have difficulty with hearing/understanding during the telephone conversation . Reports that she has had a formal eye exam by an eye care professional within the past year . Reports that she has had a formal hearing evaluation within the past year *Unable to fully assess hearing and vision during telephone visit type  Cognitive Function: 6CIT Screen 02/17/2019  What Year? 0 points  What month? 0 points  What time? 0 points  Count back from 20 0 points  Months in reverse 0 points  Repeat phrase 0 points  Total Score 0   (Normal:0-7, Significant for Dysfunction: >8)  Normal Cognitive Function Screening: Yes   Immunization & Health Maintenance Record Immunization History  Administered Date(s) Administered  . Influenza, High Dose Seasonal PF 06/13/2016, 07/03/2017, 06/16/2018  . Influenza,inj,Quad PF,6+ Mos 05/20/2013, 05/31/2014, 07/18/2015  . Influenza-Unspecified 08/20/2011  . Pneumococcal Conjugate-13 05/31/2014  . Pneumococcal Polysaccharide-23 07/22/2012  . Tdap 02/02/2015    Health Maintenance  Topic Date Due  . OPHTHALMOLOGY EXAM  01/22/2018  . DEXA SCAN  06/19/2018  . FOOT EXAM  07/03/2018  . INFLUENZA VACCINE  03/20/2019  . HEMOGLOBIN A1C  05/01/2019  . TETANUS/TDAP  02/01/2025  . PNA vac Low Risk Adult  Completed       Assessment  This is a routine wellness examination for Kristi Willis.  Health Maintenance: Due or Overdue Health Maintenance Due  Topic Date Due  . OPHTHALMOLOGY EXAM  01/22/2018  . DEXA SCAN  06/19/2018  . FOOT EXAM  07/03/2018    Kristi Willis does not need a referral for Community Assistance: Care Management:   no Social Work:    no Prescription Assistance:  no  Nutrition/Diabetes Education:  no   Plan:  Personalized Goals Goals Addressed   None    Personalized Health Maintenance & Screening Recommendations  Bone densitometry screening Diabetes screening Glaucoma screening Shinfrix  Lung Cancer Screening Recommended: no (Low Dose CT Chest recommended if Age 58-80 years, 30 pack-year currently smoking OR have quit w/in past 15 years) Hepatitis C Screening recommended: no HIV Screening recommended: no  Advanced Directives: Written information was not prepared per patient's request.  Referrals & Orders No orders of the defined types were placed in this encounter.   Follow-up Plan . Follow-up with Sharion Balloon, FNP as planned    I have personally reviewed and noted the following in the patient's chart:   . Medical and social history . Use of alcohol, tobacco or illicit drugs  . Current medications and supplements . Functional ability and status . Nutritional status . Physical activity . Advanced directives . List of other physicians . Hospitalizations, surgeries, and ER visits in previous 12 months . Vitals . Screenings to include cognitive, depression, and falls . Referrals and appointments  In addition, I have reviewed and discussed with Kristi Willis certain preventive protocols, quality metrics, and best practice recommendations. A written personalized care plan for preventive services as well as general preventive health recommendations is available and can be mailed to the patient at her request.  Wardell Heath, LPN        11/25/4718   I have reviewed and agree with the above AWV documentation.   Evelina Dun, FNP

## 2019-03-15 ENCOUNTER — Ambulatory Visit (HOSPITAL_COMMUNITY): Admission: RE | Admit: 2019-03-15 | Payer: Medicare Other | Source: Ambulatory Visit

## 2019-03-23 ENCOUNTER — Other Ambulatory Visit: Payer: Self-pay | Admitting: Family

## 2019-04-02 ENCOUNTER — Other Ambulatory Visit: Payer: Self-pay | Admitting: Neurology

## 2019-04-05 ENCOUNTER — Encounter: Payer: Self-pay | Admitting: Family

## 2019-04-05 ENCOUNTER — Ambulatory Visit (INDEPENDENT_AMBULATORY_CARE_PROVIDER_SITE_OTHER): Payer: Medicare Other | Admitting: Family

## 2019-04-05 ENCOUNTER — Other Ambulatory Visit: Payer: Self-pay

## 2019-04-05 ENCOUNTER — Other Ambulatory Visit: Payer: Self-pay | Admitting: Neurology

## 2019-04-05 DIAGNOSIS — IMO0002 Reserved for concepts with insufficient information to code with codable children: Secondary | ICD-10-CM

## 2019-04-05 DIAGNOSIS — F411 Generalized anxiety disorder: Secondary | ICD-10-CM

## 2019-04-05 DIAGNOSIS — E1165 Type 2 diabetes mellitus with hyperglycemia: Secondary | ICD-10-CM

## 2019-04-05 DIAGNOSIS — I10 Essential (primary) hypertension: Secondary | ICD-10-CM

## 2019-04-05 DIAGNOSIS — J4520 Mild intermittent asthma, uncomplicated: Secondary | ICD-10-CM

## 2019-04-05 DIAGNOSIS — F800 Phonological disorder: Secondary | ICD-10-CM

## 2019-04-05 DIAGNOSIS — E039 Hypothyroidism, unspecified: Secondary | ICD-10-CM

## 2019-04-05 DIAGNOSIS — G4700 Insomnia, unspecified: Secondary | ICD-10-CM

## 2019-04-05 DIAGNOSIS — E1140 Type 2 diabetes mellitus with diabetic neuropathy, unspecified: Secondary | ICD-10-CM

## 2019-04-05 DIAGNOSIS — F331 Major depressive disorder, recurrent, moderate: Secondary | ICD-10-CM

## 2019-04-05 NOTE — Progress Notes (Signed)
Virtual Visit via telephone Note Due to COVID-19 pandemic this visit was conducted virtually. This visit type was conducted due to national recommendations for restrictions regarding the COVID-19 Pandemic (e.g. social distancing, sheltering in place) in an effort to limit this patient's exposure and mitigate transmission in our community. All issues noted in this document were discussed and addressed.  A physical exam was not performed with this format.  I connected with Kristi Willis on 04/05/19 at 2:25 pm by telephone and verified that I am speaking with the correct person using two identifiers. Kristi Willis is currently located at home and daughter is currently with her during visit. The provider, Evelina Dun, FNP is located in their office at time of visit.  I discussed the limitations, risks, security and privacy concerns of performing an evaluation and management service by telephone and the availability of in person appointments. I also discussed with the patient that there may be a patient responsible charge related to this service. The patient expressed understanding and agreed to proceed.   History and Present Illness:  Pt and daughter call today for chronic follow up. She has aphasia so her daughter is doing most of the talking for her today. She is working in the garden every day and has been very good for her anxiety and depression for her.  Hypertension This is a chronic problem. The current episode started more than 1 year ago. The problem has been resolved since onset. The problem is controlled. Associated symptoms include anxiety and malaise/fatigue. Pertinent negatives include no blurred vision, headaches, peripheral edema or shortness of breath. Risk factors for coronary artery disease include dyslipidemia, diabetes mellitus, obesity and sedentary lifestyle. The current treatment provides moderate improvement. There is no history of CAD/MI or CVA. Identifiable causes  of hypertension include a thyroid problem.  Diabetes She presents for her follow-up diabetic visit. She has type 2 diabetes mellitus. Hypoglycemia symptoms include nervousness/anxiousness. Pertinent negatives for hypoglycemia include no headaches. Associated symptoms include fatigue and foot paresthesias. Pertinent negatives for diabetes include no blurred vision and no visual change. There are no hypoglycemic complications. Symptoms are stable. Diabetic complications include peripheral neuropathy. Pertinent negatives for diabetic complications include no CVA, heart disease or nephropathy. Risk factors for coronary artery disease include dyslipidemia, diabetes mellitus, hypertension, sedentary lifestyle and post-menopausal. (Does not check BS at home)  Depression        This is a chronic problem.  The current episode started more than 1 year ago.   The onset quality is gradual.   The problem occurs intermittently.  Associated symptoms include decreased concentration, fatigue, irritable, restlessness and sad.  Associated symptoms include no helplessness, no hopelessness and no headaches.  Past treatments include SSRIs - Selective serotonin reuptake inhibitors.  Compliance with treatment is good.  Past medical history includes thyroid problem and anxiety.   Anxiety Presents for follow-up visit. Symptoms include decreased concentration, depressed mood, excessive worry, irritability, nervous/anxious behavior and restlessness. Patient reports no shortness of breath. Symptoms occur constantly. The severity of symptoms is moderate.   Her past medical history is significant for asthma.  Thyroid Problem Presents for follow-up visit. Symptoms include anxiety, depressed mood, fatigue and hoarse voice. Patient reports no visual change. The symptoms have been stable.  Asthma She complains of hoarse voice. There is no difficulty breathing or shortness of breath. This is a chronic problem. The current episode started  more than 1 year ago. The problem occurs intermittently. Associated symptoms include malaise/fatigue. Pertinent negatives  include no headaches. Her past medical history is significant for asthma.      Review of Systems  Constitutional: Positive for fatigue, irritability and malaise/fatigue.  HENT: Positive for hoarse voice.   Eyes: Negative for blurred vision.  Respiratory: Negative for shortness of breath.   Neurological: Negative for headaches.  Psychiatric/Behavioral: Positive for decreased concentration and depression. The patient is nervous/anxious.   All other systems reviewed and are negative.    Observations/Objective: No SOB or distress noted   Assessment and Plan: Kristi Willis comes in today with chief complaint of No chief complaint on file.   Diagnosis and orders addressed:  1. Essential hypertension  2. Mild intermittent asthma without complication  3. Uncontrolled type 2 diabetes mellitus with diabetic neuropathy, without long-term current use of insulin ([name])  4. Hypothyroidism, unspecified type  5. Moderate episode of recurrent major depressive disorder ([name])  6. GAD (generalized anxiety disorder)  7. Insomnia, unspecified type  8. Speech articulation disorder   Labs pending- Need to have lab work drawn Health Maintenance reviewed Diet and exercise encouraged  Follow up plan: 4 months       I discussed the assessment and treatment plan with the patient. The patient was provided an opportunity to ask questions and all were answered. The patient agreed with the plan and demonstrated an understanding of the instructions.   The patient was advised to call back or seek an in-person evaluation if the symptoms worsen or if the condition fails to improve as anticipated.  The above assessment and management plan was discussed with the patient. The patient verbalized understanding of and has agreed to the management plan. Patient is aware to call  the clinic if symptoms persist or worsen. Patient is aware when to return to the clinic for a follow-up visit. Patient educated on when it is appropriate to go to the emergency department.   Time call ended: 2:40   I provided 15 minutes of non-face-to-face time during this encounter.    Evelina Dun, FNP

## 2019-04-06 ENCOUNTER — Telehealth: Payer: Self-pay | Admitting: Family

## 2019-04-06 MED ORDER — ESCITALOPRAM OXALATE 20 MG PO TABS: 20.0000 mg | ORAL_TABLET | Freq: Every day | ORAL | 3 refills | Status: DC

## 2019-04-06 NOTE — Telephone Encounter (Signed)
Prescription sent to pharmacy.

## 2019-04-21 ENCOUNTER — Telehealth: Payer: Self-pay | Admitting: Family

## 2019-04-21 ENCOUNTER — Other Ambulatory Visit: Payer: Self-pay | Admitting: *Deleted

## 2019-04-21 ENCOUNTER — Other Ambulatory Visit: Payer: Self-pay | Admitting: "Endocrinology

## 2019-04-21 MED ORDER — METFORMIN HCL 1000 MG PO TABS: 1000.0000 mg | ORAL_TABLET | Freq: Two times a day (BID) | ORAL | 0 refills | Status: DC

## 2019-04-21 NOTE — Telephone Encounter (Signed)
Patient aware, script is ready. 

## 2019-04-21 NOTE — Telephone Encounter (Signed)
What is the name of the medication? metFORMIN (GLUCOPHAGE) 1000 MG tablet  Have you contacted your pharmacy to request a refill? Yes denied but pt was here 04/05/2019  Which pharmacy would you like this sent to? [facility]   Patient notified that their request is being sent to the clinical staff for review and that they should receive a call once it is complete. If they do not receive a call within 24 hours they can check with their pharmacy or our office.

## 2019-05-31 ENCOUNTER — Other Ambulatory Visit: Payer: Self-pay | Admitting: Family

## 2019-05-31 ENCOUNTER — Other Ambulatory Visit: Payer: Self-pay | Admitting: Family Medicine

## 2019-06-10 ENCOUNTER — Other Ambulatory Visit: Payer: Self-pay | Admitting: "Endocrinology

## 2019-06-16 ENCOUNTER — Telehealth: Payer: Self-pay | Admitting: "Endocrinology

## 2019-06-16 DIAGNOSIS — R5382 Chronic fatigue, unspecified: Secondary | ICD-10-CM

## 2019-06-16 NOTE — Telephone Encounter (Signed)
Pt is requesting a refill on Janumet. She made appt and called about lab order

## 2019-06-18 ENCOUNTER — Other Ambulatory Visit: Payer: Medicare Other

## 2019-06-18 ENCOUNTER — Other Ambulatory Visit: Payer: Self-pay

## 2019-06-21 ENCOUNTER — Other Ambulatory Visit: Payer: Self-pay

## 2019-06-21 ENCOUNTER — Other Ambulatory Visit: Payer: Medicare Other

## 2019-06-21 DIAGNOSIS — E1169 Type 2 diabetes mellitus with other specified complication: Secondary | ICD-10-CM

## 2019-06-21 DIAGNOSIS — F800 Phonological disorder: Secondary | ICD-10-CM

## 2019-06-21 DIAGNOSIS — G4700 Insomnia, unspecified: Secondary | ICD-10-CM

## 2019-06-21 DIAGNOSIS — E785 Hyperlipidemia, unspecified: Secondary | ICD-10-CM

## 2019-06-21 DIAGNOSIS — F331 Major depressive disorder, recurrent, moderate: Secondary | ICD-10-CM

## 2019-06-21 DIAGNOSIS — E1140 Type 2 diabetes mellitus with diabetic neuropathy, unspecified: Secondary | ICD-10-CM

## 2019-06-21 DIAGNOSIS — J4520 Mild intermittent asthma, uncomplicated: Secondary | ICD-10-CM

## 2019-06-21 DIAGNOSIS — M5136 Other intervertebral disc degeneration, lumbar region: Secondary | ICD-10-CM

## 2019-06-21 DIAGNOSIS — I10 Essential (primary) hypertension: Secondary | ICD-10-CM

## 2019-06-21 DIAGNOSIS — E039 Hypothyroidism, unspecified: Secondary | ICD-10-CM

## 2019-06-21 DIAGNOSIS — IMO0002 Reserved for concepts with insufficient information to code with codable children: Secondary | ICD-10-CM

## 2019-06-21 DIAGNOSIS — F411 Generalized anxiety disorder: Secondary | ICD-10-CM

## 2019-06-21 LAB — BAYER DCA HB A1C WAIVED: HB A1C (BAYER DCA - WAIVED): 7 % — ABNORMAL HIGH (ref <7.0)

## 2019-06-22 LAB — CMP14+EGFR
ALT: 16 IU/L (ref 0–32)
AST: 16 IU/L (ref 0–40)
Albumin/Globulin Ratio: 1.8 (ref 1.2–2.2)
Albumin: 4.4 g/dL (ref 3.7–4.7)
Alkaline Phosphatase: 56 IU/L (ref 39–117)
BUN/Creatinine Ratio: 16 (ref 12–28)
BUN: 10 mg/dL (ref 8–27)
Bilirubin Total: 0.4 mg/dL (ref 0.0–1.2)
CO2: 24 mmol/L (ref 20–29)
Calcium: 9.3 mg/dL (ref 8.7–10.3)
Chloride: 99 mmol/L (ref 96–106)
Creatinine, Ser: 0.62 mg/dL (ref 0.57–1.00)
GFR calc Af Amer: 101 mL/min/{1.73_m2} (ref >59)
GFR calc non Af Amer: 87 mL/min/{1.73_m2} (ref >59)
Globulin, Total: 2.4 g/dL (ref 1.5–4.5)
Glucose: 181 mg/dL — ABNORMAL HIGH (ref 65–99)
Potassium: 4.1 mmol/L (ref 3.5–5.2)
Sodium: 136 mmol/L (ref 134–144)
Total Protein: 6.8 g/dL (ref 6.0–8.5)

## 2019-06-22 LAB — CBC WITH DIFFERENTIAL/PLATELET
Basophils Absolute: 0 10*3/uL (ref 0.0–0.2)
Basos: 1 %
EOS (ABSOLUTE): 0.3 10*3/uL (ref 0.0–0.4)
Eos: 3 %
Hematocrit: 34.7 % (ref 34.0–46.6)
Hemoglobin: 11.3 g/dL (ref 11.1–15.9)
Immature Grans (Abs): 0 10*3/uL (ref 0.0–0.1)
Immature Granulocytes: 0 %
Lymphocytes Absolute: 2.7 10*3/uL (ref 0.7–3.1)
Lymphs: 32 %
MCH: 28.4 pg (ref 26.6–33.0)
MCHC: 32.6 g/dL (ref 31.5–35.7)
MCV: 87 fL (ref 79–97)
Monocytes Absolute: 0.6 10*3/uL (ref 0.1–0.9)
Monocytes: 7 %
Neutrophils Absolute: 4.8 10*3/uL (ref 1.4–7.0)
Neutrophils: 57 %
Platelets: 235 10*3/uL (ref 150–450)
RBC: 3.98 x10E6/uL (ref 3.77–5.28)
RDW: 12.8 % (ref 11.7–15.4)
WBC: 8.4 10*3/uL (ref 3.4–10.8)

## 2019-06-22 LAB — T4, FREE: Free T4: 1.05 ng/dL (ref 0.82–1.77)

## 2019-06-22 LAB — LIPID PANEL
Chol/HDL Ratio: 4 ratio (ref 0.0–4.4)
Cholesterol, Total: 179 mg/dL (ref 100–199)
HDL: 45 mg/dL (ref >39)
LDL Chol Calc (NIH): 96 mg/dL (ref 0–99)
Triglycerides: 226 mg/dL — ABNORMAL HIGH (ref 0–149)
VLDL Cholesterol Cal: 38 mg/dL (ref 5–40)

## 2019-06-22 LAB — TSH: TSH: 3.19 u[IU]/mL (ref 0.450–4.500)

## 2019-06-24 ENCOUNTER — Other Ambulatory Visit: Payer: Self-pay | Admitting: Family

## 2019-06-24 MED ORDER — ROSUVASTATIN CALCIUM 20 MG PO TABS: 20.0000 mg | ORAL_TABLET | Freq: Every day | ORAL | 11 refills | Status: DC

## 2019-06-25 ENCOUNTER — Other Ambulatory Visit: Payer: Self-pay | Admitting: Family

## 2019-06-28 ENCOUNTER — Encounter: Payer: Self-pay | Admitting: "Endocrinology

## 2019-06-28 ENCOUNTER — Other Ambulatory Visit: Payer: Self-pay

## 2019-06-28 ENCOUNTER — Ambulatory Visit (INDEPENDENT_AMBULATORY_CARE_PROVIDER_SITE_OTHER): Payer: Medicare Other | Admitting: "Endocrinology

## 2019-06-28 ENCOUNTER — Other Ambulatory Visit: Payer: Self-pay | Admitting: Family

## 2019-06-28 VITALS — BP 142/67 | HR 60 | Ht 63.0 in | Wt 166.0 lb

## 2019-06-28 DIAGNOSIS — E039 Hypothyroidism, unspecified: Secondary | ICD-10-CM

## 2019-06-28 DIAGNOSIS — I10 Essential (primary) hypertension: Secondary | ICD-10-CM

## 2019-06-28 DIAGNOSIS — E782 Mixed hyperlipidemia: Secondary | ICD-10-CM

## 2019-06-28 DIAGNOSIS — E1165 Type 2 diabetes mellitus with hyperglycemia: Secondary | ICD-10-CM | POA: Diagnosis not present

## 2019-06-28 MED ORDER — LEVOTHYROXINE SODIUM 75 MCG PO TABS: 75.0000 ug | ORAL_TABLET | Freq: Every day | ORAL | 6 refills | Status: DC

## 2019-06-28 NOTE — Progress Notes (Signed)
Endocrinology follow-up note   Subjective:    Patient ID: Kristi Willis, female    DOB: 07-25-1941. Patient is being seen in follow-up for  management of type 2 diabetes, hyperlipidemia, hypertension. PCP: Sharion Balloon, FNP  Past Medical History:  Diagnosis Date  . Allergy   . Asthma   . Cancer ([place])    skin  . Diabetes mellitus   . GERD (gastroesophageal reflux disease)    hiatal hernia  . Hypercholesteremia   . Hypertension   . Neuromuscular disorder (H[facility])    DM neuropathy  . Neuropathy   . SVT (supraventricular tachycardia) ([facility])    Past Surgical History:  Procedure Laterality Date  . BREAST BIOPSY     left breast biopsy  . LUMBAR LAMINECTOMY  08/10  . SKIN CANCER EXCISION  2005  . SPINE SURGERY     lumbar laminectomy   Social History   Socioeconomic History  . Marital status: Widowed    Spouse name: Not on file  . Number of children: 1  . Years of education: Not on file  . Highest education level: 12th grade  Occupational History  . Occupation: Retired from [employer] but still works part-time  Social Needs  . Financial resource strain: Not hard at all  . Food insecurity    Worry: Never true    Inability: Never true  . Transportation needs    Medical: No    Non-medical: No  Tobacco Use  . Smoking status: Never Smoker  . Smokeless tobacco: Never Used  Substance and Sexual Activity  . Alcohol use: No  . Drug use: No  . Sexual activity: Never  Lifestyle  . Physical activity    Days per week: 0 days    Minutes per session: 0 min  . Stress: Not at all  Relationships  . Social connections    Talks on phone: More than three times a week    Gets together: More than three times a week    Attends religious service: More than 4 times per year    Active member of club or organization: Yes    Attends meetings of clubs or organizations: More than 4 times per year    Relationship status: Widowed  Other Topics Concern  . Not on file  Social  History Narrative   Lives with husband, who is a patient of Dr Lovena Le. She is active around the house and exercises regularly.   Outpatient Encounter Medications as of 06/28/2019  Medication Sig  . ACCU-CHEK AVIVA PLUS test strip CHECK BLOOD SUGAR UP TO 4 TIMES A DAY OR AS DIRECTED  . ACCU-CHEK SOFTCLIX LANCETS lancets CHECK BLOOD SUGAR UP TO 4 TIMES A DAY OR AS DIRECTED  . acyclovir ointment (ZOVIRAX) 5 % APPLY TO THE AFFECTED AREA AROUND MOUTH EVERY 3 HOURS AS DIRECTED UNTIL CLEAR  . albuterol (VENTOLIN HFA) 108 (90 BASE) MCG/ACT inhaler Inhale 2 puffs into the lungs every 6 (six) hours as needed for wheezing.  Marland Kitchen amLODipine (NORVASC) 5 MG tablet TAKE 1 TABLET DAILY.  Marland Kitchen aspirin 81 MG chewable tablet Chew 81 mg by mouth daily.  Marland Kitchen azelastine (ASTELIN) 0.1 % nasal spray Place 2 sprays into both nostrils 2 (two) times daily.  . Blood Glucose Monitoring Suppl (ACCU-CHEK AVIVA PLUS) w/Device KIT CHECK BLOOD SUGAR UP TO 4 TIMES A DAY  . Calcium Carbonate-Vitamin D (CALTRATE 600+D) 600-400 MG-UNIT per tablet Take 1 tablet by mouth 2 (two) times daily.  Marland Kitchen doxazosin (CARDURA) 8  MG tablet TAKE (1) TABLET DAILY FOR HIGH BLOOD PRESSURE.  Marland Kitchen EPIPEN 2-PAK 0.3 MG/0.3ML SOAJ injection Reported on 01/24/2016  . escitalopram (LEXAPRO) 20 MG tablet Take 1 tablet (20 mg total) by mouth daily.  Marland Kitchen gabapentin (NEURONTIN) 100 MG capsule Take 1 capsule (100 mg total) 3 (three) times daily by mouth.  Marland Kitchen ketoconazole (NIZORAL) 2 % cream APPLY TO THE AFFECTED AREA(S) DAILY AS DIRECTED  . levothyroxine (SYNTHROID) 75 MCG tablet Take 1 tablet (75 mcg total) by mouth daily before breakfast.  . lisinopril (ZESTRIL) 40 MG tablet TAKE 1 TABLET DAILY  . loratadine (CLARITIN) 10 MG tablet Take 1 tablet (10 mg total) by mouth daily.  . metFORMIN (GLUCOPHAGE) 1000 MG tablet Take 1 tablet (1,000 mg total) by mouth 2 (two) times daily with a meal.  . metoprolol succinate (TOPROL-XL) 100 MG 24 hr tablet TAKE (1) TABLET TWICE A DAY.  .  montelukast (SINGULAIR) 10 MG tablet Take 1 tablet (10 mg total) by mouth at bedtime.  . Multiple Vitamin (MULTIVITAMIN) tablet Take 1 tablet by mouth daily.    . Omega-3 Fatty Acids (FISH OIL) 1200 MG CAPS Take by mouth.  . Pyridoxine HCl (VITAMIN B-6 PO) Take 1 tablet by mouth 2 (two) times daily. Reported on 01/24/2016  . rosuvastatin (CRESTOR) 20 MG tablet Take 1 tablet (20 mg total) by mouth at bedtime.  . sitaGLIPtin (JANUVIA) 50 MG tablet Take 1 tablet (50 mg total) by mouth daily.  . vitamin B-12 (CYANOCOBALAMIN) 500 MCG tablet Take 500 mcg by mouth daily.    . Vitamin D, Ergocalciferol, (DRISDOL) 1.25 MG (50000 UT) CAPS capsule TAKE 1 CAPSULE ONCE A WEEK  . [DISCONTINUED] levothyroxine (SYNTHROID, LEVOTHROID) 50 MCG tablet Take 1 tablet (50 mcg total) by mouth daily.   No facility-administered encounter medications on file as of 06/28/2019.    ALLERGIES: Allergies  Allergen Reactions  . Dimetapp C [Phenylephrine-Bromphen-Codeine] Itching  . Phenylephrine Hcl Other (See Comments)    unknown  . Ultram [Tramadol Hcl] Other (See Comments)    Insomnia  . Celebrex [Celecoxib] Rash  . Cortisone Rash  . Fenofibrate Rash  . Sulfa Antibiotics Rash   VACCINATION STATUS: Immunization History  Administered Date(s) Administered  . Influenza, High Dose Seasonal PF 06/13/2016, 07/03/2017, 06/16/2018  . Influenza,inj,Quad PF,6+ Mos 05/20/2013, 05/31/2014, 07/18/2015  . Influenza-Unspecified 08/20/2011  . Pneumococcal Conjugate-13 05/31/2014  . Pneumococcal Polysaccharide-23 07/22/2012  . Tdap 02/02/2015    Diabetes She presents for her follow-up diabetic visit. She has type 2 diabetes mellitus. Onset time: She was diagnosed at approximate age of 78 years. Her disease course has been improving. There are no hypoglycemic associated symptoms. Pertinent negatives for hypoglycemia include no confusion, headaches, pallor or seizures. Pertinent negatives for diabetes include no chest pain, no  fatigue, no foot paresthesias, no polydipsia, no polyphagia and no polyuria. There are no hypoglycemic complications. Symptoms are improving. Diabetic complications include peripheral neuropathy. Risk factors for coronary artery disease include diabetes mellitus, dyslipidemia, hypertension and sedentary lifestyle. Her weight is stable. She is following a generally unhealthy diet. When asked about meal planning, she reported none. She has not had a previous visit with a dietitian. She rarely participates in exercise. An ACE inhibitor/angiotensin II receptor blocker is being taken. She sees a podiatrist.Eye exam is current.  Hyperlipidemia This is a chronic problem. The current episode started more than 1 year ago. The problem is controlled. Recent lipid tests were reviewed and are high. Exacerbating diseases include diabetes. Pertinent negatives include no  chest pain, myalgias or shortness of breath. Current antihyperlipidemic treatment includes statins. Risk factors for coronary artery disease include dyslipidemia, diabetes mellitus, hypertension and a sedentary lifestyle.  Hypertension This is a chronic problem. The current episode started more than 1 year ago. The problem is controlled. Pertinent negatives include no chest pain, headaches, palpitations or shortness of breath. Risk factors for coronary artery disease include diabetes mellitus, dyslipidemia, sedentary lifestyle and family history. Past treatments include ACE inhibitors and beta blockers.    Review of Systems  Constitutional: Negative for chills, fatigue, fever and unexpected weight change.  HENT: Negative for trouble swallowing and voice change.   Eyes: Negative for visual disturbance.  Respiratory: Negative for cough, shortness of breath and wheezing.   Cardiovascular: Negative for chest pain, palpitations and leg swelling.  Gastrointestinal: Negative for diarrhea, nausea and vomiting.  Endocrine: Negative for cold intolerance, heat  intolerance, polydipsia, polyphagia and polyuria.  Musculoskeletal: Negative for arthralgias and myalgias.  Skin: Negative for color change, pallor, rash and wound.  Neurological: Negative for seizures and headaches.  Psychiatric/Behavioral: Negative for confusion and suicidal ideas.    Objective:    BP (!) 142/67   Pulse 60   Ht 5' 3" (1.6 m)   Wt 166 lb (75.3 kg)   BMI 29.41 kg/m   Wt Readings from Last 3 Encounters:  06/28/19 166 lb (75.3 kg)  02/17/19 162 lb 9.6 oz (73.8 kg)  10/29/18 162 lb 9.6 oz (73.8 kg)    Physical Exam  Constitutional: She is oriented to person, place, and time. She appears well-developed.  HENT:  Head: Normocephalic and atraumatic.  Eyes: EOM are normal.  Neck: Normal range of motion. Neck supple. No tracheal deviation present. No thyromegaly present.  Pulmonary/Chest: Effort normal.  Abdominal: There is no abdominal tenderness. There is no guarding.  Musculoskeletal: Normal range of motion.        General: No edema.  Neurological: She is alert and oriented to person, place, and time. No cranial nerve deficit. Coordination normal.  Hard of hearing, motor aphasia.  Skin: Skin is warm and dry. No rash noted. No erythema. No pallor.  Psychiatric: She has a normal mood and affect. Judgment normal.    CMP     Component Value Date/Time   NA 136 06/21/2019 0943   K 4.1 06/21/2019 0943   CL 99 06/21/2019 0943   CO2 24 06/21/2019 0943   GLUCOSE 181 (H) 06/21/2019 0943   GLUCOSE 187 (H) 02/04/2016 1934   BUN 10 06/21/2019 0943   CREATININE 0.62 06/21/2019 0943   CREATININE 0.54 03/02/2013 0931   CALCIUM 9.3 06/21/2019 0943   PROT 6.8 06/21/2019 0943   ALBUMIN 4.4 06/21/2019 0943   AST 16 06/21/2019 0943   ALT 16 06/21/2019 0943   ALKPHOS 56 06/21/2019 0943   BILITOT 0.4 06/21/2019 0943   GFRNONAA 87 06/21/2019 0943   GFRNONAA >89 03/02/2013 0931   GFRAA 101 06/21/2019 0943   GFRAA >89 03/02/2013 0931     Diabetic Labs (most recent): Lab  Results  Component Value Date   HGBA1C 7.0 (H) 06/21/2019   HGBA1C 7.1 (H) 10/29/2018   HGBA1C 7.1 (H) 06/29/2018     Lipid Panel ( most recent) Lipid Panel     Component Value Date/Time   CHOL 179 06/21/2019 0943   CHOL 177 02/23/2013 0911   TRIG 226 (H) 06/21/2019 0943   TRIG 203 (H) 01/12/2015 0915   TRIG 293 (H) 02/23/2013 0911   HDL 45 06/21/2019 0943  HDL 40 01/12/2015 0915   HDL 48 02/23/2013 0911   CHOLHDL 4.0 06/21/2019 0943   LDLCALC 96 06/21/2019 0943   LDLCALC 119 (H) 10/06/2013 1012   LDLCALC 70 02/23/2013 0911      Assessment & Plan:   1. Uncontrolled type 2 diabetes mellitus with diabetic neuropathy, without long-term current use of insulin ([place])  - Patient has currently uncontrolled symptomatic type 2 DM since  78 years of age. -She returns with continued improvement with A1c of 7% generally improving from 9.3%.     Recent labs reviewed, showing normal renal function.   Her diabetes is complicated by referral neuropathy and patient remains at a high risk for more acute and chronic complications of diabetes which include CAD, CVA, CKD, retinopathy, and neuropathy. These are all discussed in detail with the patient.  - I have counseled the patient on diet management and weight loss, by adopting a carbohydrate restricted/protein rich diet.  - she  admits there is a room for improvement in her diet and drink choices. -  Suggestion is made for her to avoid simple carbohydrates  from her diet including Cakes, Sweet Desserts / Pastries, Ice Cream, Soda (diet and regular), Sweet Tea, Candies, Chips, Cookies, Sweet Pastries,  Store Bought Juices, Alcohol in Excess of  1-2 drinks a day, Artificial Sweeteners, Coffee Creamer, and "Sugar-free" Products. This will help patient to have stable blood glucose profile and potentially avoid unintended weight gain.   - I encouraged the patient to switch to  unprocessed or minimally processed complex starch and increased  protein intake (animal or plant source), fruits, and vegetables.  - Patient is advised to stick to a routine mealtimes to eat 3 meals  a day and avoid unnecessary snacks ( to snack only to correct hypoglycemia).   - I have approached patient with the following individualized plan to manage diabetes and patient agrees:   -She has maintained reasonable control of diabetes.  -Based on her presentation with controlled A1c of 7 % on metformin and Januvia, she will not require insulin treatment at this time.    -She is advised to continue Metformin 1000 mg p.o. twice daily after breakfast and after supper, as well as Januvia 50 mg p.o. daily with breakfast.  - Side effects and precautions discussed with her.  - Patient specific target  A1c;  LDL, HDL, Triglycerides, and  Waist Circumference were discussed in detail.  2) BP/HTN: Her blood pressure is not controlled to target.  She is advised to continue her current blood pressure medications including lisinopril 40 mg p.o. daily, metoprolol, and amlodipine.    3) Lipids/HPL: Uncontrolled but improving, LDL 102.  She is advised to continue Crestor 10 mg p.o. daily at bedtime.   4)  Weight/Diet: CDE Consult has been initiated , exercise, and detailed carbohydrates information provided.  5) hypothyroidism: Based on her thyroid function tests, she would benefit from slight increase in her levothyroxine dose.  I discussed and increased her levothyroxine to 75 mcg p.o. daily before breakfast.  - We discussed about the correct intake of her thyroid hormone, on empty stomach at fasting, with water, separated by at least 30 minutes from breakfast and other medications,  and separated by more than 4 hours from calcium, iron, multivitamins, acid reflux medications (PPIs). -Patient is made aware of the fact that thyroid hormone replacement is needed for life, dose to be adjusted by periodic monitoring of thyroid function tests.   6) Chronic Care/Health  Maintenance:  -Patient  is on ACEI/ARB and Statin medications and encouraged to continue to follow up with Ophthalmology, Podiatrist at least yearly or according to recommendations, and advised to  stay away from smoking. I have recommended yearly flu vaccine and pneumonia vaccination at least every 5 years; moderate intensity exercise for up to 150 minutes weekly; and  sleep for at least 7 hours a day.  - I advised patient to maintain close follow up with Sharion Balloon, FNP for primary care needs.  - Time spent with the patient: 25 min, of which >50% was spent in reviewing her  current and  previous labs, previous treatments, and medications doses and developing a plan for long-term care.  Kristi Willis participated in the discussions, expressed understanding, and voiced agreement with the above plans.  All questions were answered to her satisfaction. she is encouraged to contact clinic should she have any questions or concerns prior to her return visit.   Follow up plan: - Return in about 6 months (around 12/26/2019), or office, for Follow up with Pre-visit Labs, Next Visit A1c in Office.  Glade Lloyd, MD Phone: [phone]  Fax: [phone]  -  This note was partially dictated with voice recognition software. Similar sounding words can be transcribed inadequately or may not  be corrected upon review.  06/28/2019, 12:41 PM

## 2019-06-29 ENCOUNTER — Ambulatory Visit (INDEPENDENT_AMBULATORY_CARE_PROVIDER_SITE_OTHER): Payer: Medicare Other

## 2019-06-29 ENCOUNTER — Other Ambulatory Visit: Payer: Self-pay

## 2019-06-29 DIAGNOSIS — Z23 Encounter for immunization: Secondary | ICD-10-CM

## 2019-07-20 ENCOUNTER — Emergency Department (HOSPITAL_COMMUNITY)
Admission: EM | Admit: 2019-07-20 | Discharge: 2019-07-21 | Disposition: A | Discharge status: Home / Self Care | Payer: Medicare Other | Attending: Emergency Medicine | Admitting: Emergency Medicine

## 2019-07-20 ENCOUNTER — Emergency Department (HOSPITAL_COMMUNITY): Payer: Medicare Other

## 2019-07-20 ENCOUNTER — Other Ambulatory Visit: Payer: Self-pay | Admitting: Family

## 2019-07-20 DIAGNOSIS — R0789 Other chest pain: Secondary | ICD-10-CM | POA: Diagnosis present

## 2019-07-20 DIAGNOSIS — Z5321 Procedure and treatment not carried out due to patient leaving prior to being seen by health care provider: Secondary | ICD-10-CM | POA: Insufficient documentation

## 2019-07-20 LAB — CBC
HCT: 34.4 % — ABNORMAL LOW (ref 36.0–46.0)
Hemoglobin: 10.8 g/dL — ABNORMAL LOW (ref 12.0–15.0)
MCH: 28.4 pg (ref 26.0–34.0)
MCHC: 31.4 g/dL (ref 30.0–36.0)
MCV: 90.5 fL (ref 80.0–100.0)
Platelets: 251 10*3/uL (ref 150–400)
RBC: 3.8 MIL/uL — ABNORMAL LOW (ref 3.87–5.11)
RDW: 13.2 % (ref 11.5–15.5)
WBC: 8.6 10*3/uL (ref 4.0–10.5)
nRBC: 0 % (ref 0.0–0.2)

## 2019-07-20 LAB — BASIC METABOLIC PANEL
Anion gap: 14 (ref 5–15)
BUN: 9 mg/dL (ref 8–23)
CO2: 25 mmol/L (ref 22–32)
Calcium: 9.4 mg/dL (ref 8.9–10.3)
Chloride: 100 mmol/L (ref 98–111)
Creatinine, Ser: 0.6 mg/dL (ref 0.44–1.00)
GFR calc Af Amer: >60 mL/min (ref >60)
GFR calc non Af Amer: >60 mL/min (ref >60)
Glucose, Bld: 173 mg/dL — ABNORMAL HIGH (ref 70–99)
Potassium: 4 mmol/L (ref 3.5–5.1)
Sodium: 139 mmol/L (ref 135–145)

## 2019-07-20 LAB — TROPONIN I (HIGH SENSITIVITY): Troponin I (High Sensitivity): 3 ng/L (ref <18)

## 2019-07-20 MED ORDER — SODIUM CHLORIDE 0.9% FLUSH: 3.0000 mL | Freq: Once | INTRAVENOUS | Status: DC

## 2019-07-20 NOTE — ED Triage Notes (Signed)
Pt here sent from asthma doctor for evaluation of central chest pain with pain into right shoulder. Pt's daughter accompanies here as pt has existing aphasia. Sts she raked leaves on Saturday and was very tired from that but didn't mention cp until yesterday. Pt describes wheezing, but asthma md evaluated this morning and sts no wheezing.

## 2019-07-21 ENCOUNTER — Other Ambulatory Visit: Payer: Self-pay | Admitting: Family Medicine

## 2019-07-21 ENCOUNTER — Telehealth: Payer: Self-pay | Admitting: Family

## 2019-07-21 DIAGNOSIS — I517 Cardiomegaly: Secondary | ICD-10-CM

## 2019-07-21 DIAGNOSIS — I480 Paroxysmal atrial fibrillation: Secondary | ICD-10-CM

## 2019-07-21 DIAGNOSIS — R0789 Other chest pain: Secondary | ICD-10-CM

## 2019-07-21 LAB — TROPONIN I (HIGH SENSITIVITY): Troponin I (High Sensitivity): 3 ng/L (ref <18)

## 2019-07-21 NOTE — ED Notes (Signed)
Pt daughter very agitated stating "You shouldn't put a commercial out about not hesitating to go to the emergency room when you suspect a heart issue but then you place an elderly pt in the lobby on the back burner without treatment". Daughter states to pass this on to upper management because she knows this is not our fault here in the lobby but this issue needs to be addressed. Pt left with her daughter

## 2019-07-21 NOTE — Telephone Encounter (Signed)
Patients daughter wants provider to go over labs EKG and chest xray done at [facility] last night. PCP hawks she is off. [place] covering. Daughter asking for this to be done today.

## 2019-07-21 NOTE — Telephone Encounter (Signed)
Attempted to reach daughter at number provided but goes straight to voicemail and "mailbox full" so no VM left.  Will cc to PCP to have review requested studies

## 2019-07-21 NOTE — Progress Notes (Signed)
Spoke with patient started today with regards to the testing that was done at emergency department yesterday.  Her troponin and delta troponin were negative.  Chest x-ray was notable for small bilateral pleural effusions suggestive of atelectasis.  She does have mild cardiomegaly on the chest x-ray.  EKG with nonspecific ST changes.  She seem to be in normal sinus rhythm.  Nothing to suggest ischemia on her EKG.  Her daughter calls asking for urgent referral to Dr. Olin Pia office.  This has been placed.  She was previously seen by Dr. Caryl Comes many years ago for atrial fibrillation.

## 2019-07-21 NOTE — Telephone Encounter (Signed)
Patients daughter is asking for a call back from Dr. Lajuana Ripple at [phone]

## 2019-07-21 NOTE — Telephone Encounter (Signed)
Done. See note.

## 2019-07-26 ENCOUNTER — Other Ambulatory Visit: Payer: Self-pay | Admitting: "Endocrinology

## 2019-07-27 NOTE — Progress Notes (Signed)
Cardiology Office Note:    Date:  07/28/2019   ID:  Kristi Willis, DOB 1941-07-10, MRN [id number]  PCP:  Kristi Balloon, FNP  Cardiologist:  No primary care provider on file.  Electrophysiologist:  None   Referring MD: Kristi Balloon, FNP   Chief Complaint  Patient presents with  . Chest Pain    History of Present Illness:    Kristi Willis is a 78 y.o. female with a hx of type 2 diabetes, hypertension, hyperlipidemia, atrial tachycardia, aphasia, asthma who is referred by Kristi Dun, FNP for evaluation of chest pain.  History is partially provided by her daughter given patient's aphasia.  On 12/1, daughter reports that patient was walking outside and came inside reporting shortness of breath and heaviness in her chest.  She went to the ED. unfortunately had long wait in the ED so left without being seen.  Troponins were drawn, which were negative x2.  Reports that she has had chest heaviness intermittently since that time.  Lasts a few minutes and resolves.  Also has been having lower extremity edema recently.  Never smoker.  Sister had MI around age 46.  Father had MI in 85s.  Previously followed with Dr. Caryl Willis for atrial tachycardia.  Would feel like heart is racing.  Reports has been well controlled on Toprol-XL 100 mg twice daily.    Past Medical History:  Diagnosis Date  . Allergy   . Asthma   . Cancer ([place])    skin  . Diabetes mellitus   . GERD (gastroesophageal reflux disease)    hiatal hernia  . Hypercholesteremia   . Hypertension   . Neuromuscular disorder (HCC)    DM neuropathy  . Neuropathy   . SVT (supraventricular tachycardia) (HCC)     Past Surgical History:  Procedure Laterality Date  . BREAST BIOPSY     left breast biopsy  . LUMBAR LAMINECTOMY  08/10  . SKIN CANCER EXCISION  2005  . SPINE SURGERY     lumbar laminectomy    Current Medications: Current Meds  Medication Sig  . ACCU-CHEK AVIVA PLUS test strip CHECK BLOOD SUGAR UP TO 4  TIMES A DAY OR AS DIRECTED  . ACCU-CHEK SOFTCLIX LANCETS lancets CHECK BLOOD SUGAR UP TO 4 TIMES A DAY OR AS DIRECTED  . acyclovir ointment (ZOVIRAX) 5 % APPLY TO THE AFFECTED AREA AROUND MOUTH EVERY 3 HOURS AS DIRECTED UNTIL CLEAR  . albuterol (VENTOLIN HFA) 108 (90 BASE) MCG/ACT inhaler Inhale 2 puffs into the lungs every 6 (six) hours as needed for wheezing.  Marland Kitchen amLODipine (NORVASC) 5 MG tablet TAKE 1 TABLET DAILY.  Marland Kitchen aspirin 81 MG chewable tablet Chew 81 mg by mouth daily.  Marland Kitchen augmented betamethasone dipropionate (DIPROLENE-AF) 0.05 % cream APPLY TO AFFECTED AREA ON BACK AND NECK TWICE A DAY PRN. (NOT TO FACE, GROIN OR UNDERARMS)  . azelastine (ASTELIN) 0.1 % nasal spray Place 2 sprays into both nostrils 2 (two) times daily.  . Blood Glucose Monitoring Suppl (ACCU-CHEK AVIVA PLUS) w/Device KIT CHECK BLOOD SUGAR UP TO 4 TIMES A DAY  . Calcium Carbonate-Vitamin D (CALTRATE 600+D) 600-400 MG-UNIT per tablet Take 1 tablet by mouth 2 (two) times daily.  Marland Kitchen doxazosin (CARDURA) 8 MG tablet TAKE (1) TABLET DAILY FOR HIGH BLOOD PRESSURE.  Marland Kitchen EPIPEN 2-PAK 0.3 MG/0.3ML SOAJ injection Reported on 01/24/2016  . escitalopram (LEXAPRO) 20 MG tablet Take 1 tablet (20 mg total) by mouth daily.  Marland Kitchen gabapentin (NEURONTIN) 100 MG capsule Take  1 capsule (100 mg total) 3 (three) times daily by mouth.  Marland Kitchen ketoconazole (NIZORAL) 2 % cream APPLY TO THE AFFECTED AREA(S) DAILY AS DIRECTED  . levothyroxine (SYNTHROID) 75 MCG tablet Take 1 tablet (75 mcg total) by mouth daily before breakfast.  . lisinopril (ZESTRIL) 40 MG tablet TAKE 1 TABLET DAILY  . loratadine (CLARITIN) 10 MG tablet Take 1 tablet (10 mg total) by mouth daily.  . metFORMIN (GLUCOPHAGE) 1000 MG tablet TAKE  (1)  TABLET TWICE A DAY WITH MEALS (BREAKFAST AND SUPPER)  . metoprolol succinate (TOPROL-XL) 100 MG 24 hr tablet TAKE (1) TABLET TWICE A DAY.  . montelukast (SINGULAIR) 10 MG tablet Take 1 tablet (10 mg total) by mouth at bedtime.  . Multiple Vitamin  (MULTIVITAMIN) tablet Take 1 tablet by mouth daily.    . Omega-3 Fatty Acids (FISH OIL) 1200 MG CAPS Take by mouth.  . permethrin (ELIMITE) 5 % cream APPLY TO AFFECTED AREA FROM THE CHIN DOWN AT NIGHT ON DAY 1, 2 & 7. [place] UPON AWAKING. AS DIRECTED  . Pyridoxine HCl (VITAMIN B-6 PO) Take 1 tablet by mouth 2 (two) times daily. Reported on 01/24/2016  . rosuvastatin (CRESTOR) 20 MG tablet Take 1 tablet (20 mg total) by mouth at bedtime.  . sitaGLIPtin (JANUVIA) 50 MG tablet TAKE 1 TABLET DAILY  . vitamin B-12 (CYANOCOBALAMIN) 500 MCG tablet Take 500 mcg by mouth daily.    . Vitamin D, Ergocalciferol, (DRISDOL) 1.25 MG (50000 UT) CAPS capsule TAKE 1 CAPSULE ONCE A WEEK     Allergies:   Dimetapp c [phenylephrine-bromphen-codeine], Phenylephrine hcl, Ultram [tramadol hcl], Celebrex [celecoxib], Cortisone, Fenofibrate, and Sulfa antibiotics   Social History   Socioeconomic History  . Marital status: Widowed    Spouse name: Not on file  . Number of children: 1  . Years of education: Not on file  . Highest education level: 12th grade  Occupational History  . Occupation: Retired from [employer] but still works part-time  Social Needs  . Financial resource strain: Not hard at all  . Food insecurity    Worry: Never true    Inability: Never true  . Transportation needs    Medical: No    Non-medical: No  Tobacco Use  . Smoking status: Never Smoker  . Smokeless tobacco: Never Used  Substance and Sexual Activity  . Alcohol use: No  . Drug use: No  . Sexual activity: Never  Lifestyle  . Physical activity    Days per week: 0 days    Minutes per session: 0 min  . Stress: Not at all  Relationships  . Social connections    Talks on phone: More than three times a week    Gets together: More than three times a week    Attends religious service: More than 4 times per year    Active member of club or organization: Yes    Attends meetings of clubs or organizations: More than 4 times per year     Relationship status: Widowed  Other Topics Concern  . Not on file  Social History Narrative   Lives with husband, who is a patient of Dr Lovena Le. She is active around the house and exercises regularly.     Family History: The patient's family history includes Cancer in her father; Cancer (age of onset: 38) in her mother; Cancer (age of onset: 37) in her sister; Depression in her mother; Healthy in her daughter; Heart disease in her father; Heart disease (age of onset:  51) in her sister; Hypertension in her sister; Stroke in her father.  ROS:   Please see the history of present illness.     All other systems reviewed and are negative.  EKGs/Labs/Other Studies Reviewed:    The following studies were reviewed today:  EKG:  EKG is ordered today.  The ekg ordered today demonstrates normal sinus rhythm, rate 62, 1 mm ST depressions in V4-6  Recent Labs: 06/21/2019: ALT 16; TSH 3.190 07/20/2019: BUN 9; Creatinine, Ser 0.60; Hemoglobin 10.8; Platelets 251; Potassium 4.0; Sodium 139  Recent Lipid Panel    Component Value Date/Time   CHOL 179 06/21/2019 0943   CHOL 177 02/23/2013 0911   TRIG 226 (H) 06/21/2019 0943   TRIG 203 (H) 01/12/2015 0915   TRIG 293 (H) 02/23/2013 0911   HDL 45 06/21/2019 0943   HDL 40 01/12/2015 0915   HDL 48 02/23/2013 0911   CHOLHDL 4.0 06/21/2019 0943   LDLCALC 96 06/21/2019 0943   LDLCALC 119 (H) 10/06/2013 1012   LDLCALC 70 02/23/2013 0911    Physical Exam:    VS:  BP (!) 149/52   Pulse 63   Temp (!) 97.3 F (36.3 C)   Ht _0  (1.6 m)   Wt 166 lb 12.8 oz (75.7 kg)   SpO2 97%   BMI 29.55 kg/m     Wt Readings from Last 3 Encounters:  07/28/19 166 lb 12.8 oz (75.7 kg)  06/28/19 166 lb (75.3 kg)  02/17/19 162 lb 9.6 oz (73.8 kg)     GEN:in no acute distress HEENT: Normal NECK: No JVD; No carotid bruits LYMPHATICS: No lymphadenopathy CARDIAC: RRR, no murmurs, rubs, gallops RESPIRATORY:  Clear to auscultation without rales, wheezing or  rhonchi  ABDOMEN: Soft, non-tender, non-distended MUSCULOSKELETAL:  2+ BLE edema SKIN: Warm and dry NEUROLOGIC:  Alert and oriented x 3 PSYCHIATRIC:  Normal affect   ASSESSMENT:    1. Chest pain, unspecified type   2. Lower extremity edema   3. Precordial pain   4. Medication management   5. Essential hypertension   6. Hyperlipidemia, unspecified hyperlipidemia type    PLAN:    In order of problems listed above:  Chest pain: typical in description, as describes chest heaviness with exertion.  However, it is difficult to get a full history given patient's aphasia.  Will check coronary CTA to evaluate for obstructive coronary disease.  Lower extremity edema: will check TTE to evaluate for structural heart disease.  Check lower extremity duplex to rule out DVT.  Will check CMET, BNP.    Hypertension: On amlodipine 5 mg daily, doxazosin 8 mg daily, lisinopril 40 mg daily, Toprol-XL 100 mg twice daily  Hyperlipidemia: On rosuvastatin 20 mg daily.  LDL 88 on 10/29/18  Type 2 diabetes: On Januvia, Metformin.  A1c 7.0   RTC in 3 months  Medication Adjustments/Labs and Tests Ordered: Current medicines are reviewed at length with the patient today.  Concerns regarding medicines are outlined above.  Orders Placed This Encounter  Procedures  . CT CORONARY MORPH W/CTA COR W/SCORE W/CA W/CM &/OR WO/CM  . CT CORONARY FRACTIONAL FLOW RESERVE DATA PREP  . CT CORONARY FRACTIONAL FLOW RESERVE FLUID ANALYSIS  . CMET  . BNP  . EKG 12-Lead  . ECHOCARDIOGRAM COMPLETE  . VAS [place] LOWER EXTREMITY VENOUS (DVT)   No orders of the defined types were placed in this encounter.   Patient Instructions  Medication Instructions:  The current medical regimen is effective;  continue present plan  and medications as directed. Please refer to the Current Medication list given to you today. If you need a refill on your cardiac medications before your next appointment, please call your pharmacy.  Labwork:  BNP AND CMET TODAY HERE IN OUR OFFICE AT [facility]    If you have labs (blood work) drawn today and your tests are completely normal, you will receive your results only by: Marland Kitchen MyChart Message (if you have MyChart) OR . A paper copy in the mail If you have any lab test that is abnormal or we need to change your treatment, we will call you to review the results.  Testing/Procedures: Echocardiogram - Your physician has requested that you have an echocardiogram. Echocardiography is a painless test that uses sound waves to create images of your heart. It provides your doctor with information about the size and shape of your heart and how well your heart's chambers and valves are working. This procedure takes approximately one hour. There are no restrictions for this procedure. This will be performed at our [facility] location - [address].  Your physician has requested that you have a lower extremity arterial duplex-TODAY AT 330 PM. During this test, ultrasound is used to evaluate arterial blood flow in the legs. Allow one hour for this exam. There are no restrictions or special instructions.  Follow-Up: IN 3 months  In Person WITH Oswaldo Milian, MD.    At [facility], you and your health needs are our priority.  As part of our continuing mission to provide you with exceptional heart care, we have created designated Provider Care Teams.  These Care Teams include your primary Cardiologist (physician) and Advanced Practice Providers (APPs -  Physician Assistants and Nurse Practitioners) who all work together to provide you with the care you need, when you need it.  Thank you for choosing [facility] at [facility]!!             Happy Holidays!!  Your cardiac CT will be scheduled at one of the below locations:   [facility] [address] [place], [place] [address] [phone]   If scheduled at [facility], please arrive at the [facility] main  entrance of [facility] 30-45 minutes prior to test start time. Proceed to the [facility] Radiology Department (first floor) to check-in and test prep.  Please follow these instructions carefully (unless otherwise directed):  Hold all erectile dysfunction medications at least 3 days (72 hrs) prior to test.  On the Night Before the Test: . Be sure to Drink plenty of water. . Do not consume any caffeinated/decaffeinated beverages or chocolate 12 hours prior to your test. . Do not take any antihistamines 12 hours prior to your test.  On the Day of the Test: . Drink plenty of water. Do not drink any water within one hour of the test. . Do not eat any food 4 hours prior to the test. . You may take your regular medications prior to the test.  . MAKE SURE TO TAKE YOUR NORMAL DOSING OF YOUR METOPROLOL (Lopressor) two hours prior to test. . HOLD Furosemide/Hydrochlorothiazide morning of the test. . FEMALES- please wear underwire-free bra if available    After the Test: . Drink plenty of water. . After receiving IV contrast, you may experience a mild flushed feeling. This is normal. . On occasion, you may experience a mild rash up to 24 hours after the test. This is not dangerous. If  this occurs, you can take Benadryl 25 mg and increase your fluid intake. . If you experience trouble breathing, this can be serious. If it is severe call 911 IMMEDIATELY. If it is mild, please call our office. . If you take any of these medications: Glipizide/Metformin, Avandament, Glucavance, please do not take 48 hours after completing test unless otherwise instructed.   Once we have confirmed authorization from your insurance company, we will call you to set up a date and time for your test.   For non-scheduling related questions, please contact the cardiac imaging nurse navigator should you have any questions/concerns: Marchia Bond, RN Navigator Cardiac Imaging [facility]  [phone] Office          Signed, Donato Heinz, MD  07/28/2019 12:48 PM    [place]

## 2019-07-28 ENCOUNTER — Encounter: Payer: Self-pay | Admitting: Cardiology

## 2019-07-28 ENCOUNTER — Other Ambulatory Visit: Payer: Self-pay

## 2019-07-28 ENCOUNTER — Ambulatory Visit: Payer: Medicare Other | Admitting: Cardiology

## 2019-07-28 ENCOUNTER — Ambulatory Visit (HOSPITAL_COMMUNITY)
Admission: RE | Admit: 2019-07-28 | Discharge: 2019-07-28 | Disposition: A | Discharge status: Home / Self Care | Payer: Medicare Other | Source: Ambulatory Visit | Attending: Cardiology | Admitting: Cardiology

## 2019-07-28 VITALS — BP 149/52 | HR 63 | Temp 97.3°F | Ht 63.0 in | Wt 166.8 lb

## 2019-07-28 DIAGNOSIS — Z79899 Other long term (current) drug therapy: Secondary | ICD-10-CM | POA: Diagnosis not present

## 2019-07-28 DIAGNOSIS — R600 Localized edema: Secondary | ICD-10-CM

## 2019-07-28 DIAGNOSIS — I10 Essential (primary) hypertension: Secondary | ICD-10-CM

## 2019-07-28 DIAGNOSIS — R072 Precordial pain: Secondary | ICD-10-CM

## 2019-07-28 DIAGNOSIS — R079 Chest pain, unspecified: Secondary | ICD-10-CM | POA: Diagnosis not present

## 2019-07-28 DIAGNOSIS — E785 Hyperlipidemia, unspecified: Secondary | ICD-10-CM

## 2019-07-28 NOTE — Patient Instructions (Addendum)
Medication Instructions:  The current medical regimen is effective;  continue present plan and medications as directed. Please refer to the Current Medication list given to you today. If you need a refill on your cardiac medications before your next appointment, please call your pharmacy.  Labwork: BNP AND CMET TODAY HERE IN OUR OFFICE AT [facility]    If you have labs (blood work) drawn today and your tests are completely normal, you will receive your results only by: Marland Kitchen MyChart Message (if you have MyChart) OR . A paper copy in the mail If you have any lab test that is abnormal or we need to change your treatment, we will call you to review the results.  Testing/Procedures: Echocardiogram - Your physician has requested that you have an echocardiogram. Echocardiography is a painless test that uses sound waves to create images of your heart. It provides your doctor with information about the size and shape of your heart and how well your heart's chambers and valves are working. This procedure takes approximately one hour. There are no restrictions for this procedure. This will be performed at our [facility] location - [address], [address].  Your physician has requested that you have a lower extremity arterial duplex-TODAY AT 330 PM. During this test, ultrasound is used to evaluate arterial blood flow in the legs. Allow one hour for this exam. There are no restrictions or special instructions.  Follow-Up: IN 3 months  In Person WITH Oswaldo Milian, MD.    At [facility], you and your health needs are our priority.  As part of our continuing mission to provide you with exceptional heart care, we have created designated Provider Care Teams.  These Care Teams include your primary Cardiologist (physician) and Advanced Practice Providers (APPs -  Physician Assistants and Nurse Practitioners) who all work together to provide you with the care you need, when you need it.  Thank you for  choosing [facility] at [facility]!!             Happy Holidays!!  Your cardiac CT will be scheduled at one of the below locations:   [facility] [address] [place], [place] [address] [phone]   If scheduled at [facility], please arrive at the [facility] main entrance of [facility] 30-45 minutes prior to test start time. Proceed to the [facility] Radiology Department (first floor) to check-in and test prep.  Please follow these instructions carefully (unless otherwise directed):  Hold all erectile dysfunction medications at least 3 days (72 hrs) prior to test.  On the Night Before the Test: . Be sure to Drink plenty of water. . Do not consume any caffeinated/decaffeinated beverages or chocolate 12 hours prior to your test. . Do not take any antihistamines 12 hours prior to your test.  On the Day of the Test: . Drink plenty of water. Do not drink any water within one hour of the test. . Do not eat any food 4 hours prior to the test. . You may take your regular medications prior to the test.  . MAKE SURE TO TAKE YOUR NORMAL DOSING OF YOUR METOPROLOL (Lopressor) two hours prior to test. . HOLD Furosemide/Hydrochlorothiazide morning of the test. . FEMALES- please wear underwire-free bra if available    After the Test: . Drink plenty of water. . After receiving IV contrast, you may experience a mild flushed feeling. This is normal. . On occasion, you may experience a mild rash  up to 24 hours after the test. This is not dangerous. If this occurs, you can take Benadryl 25 mg and increase your fluid intake. . If you experience trouble breathing, this can be serious. If it is severe call 911 IMMEDIATELY. If it is mild, please call our office. . If you take any of these medications: Glipizide/Metformin, Avandament, Glucavance, please do not take 48 hours after completing test unless otherwise instructed.   Once we have confirmed authorization  from your insurance company, we will call you to set up a date and time for your test.   For non-scheduling related questions, please contact the cardiac imaging nurse navigator should you have any questions/concerns: Marchia Bond, RN Navigator Cardiac Imaging [facility] [phone] Office

## 2019-07-29 ENCOUNTER — Ambulatory Visit (HOSPITAL_COMMUNITY)
Admission: RE | Admit: 2019-07-29 | Discharge: 2019-07-29 | Disposition: A | Discharge status: Home / Self Care | Payer: Medicare Other | Source: Ambulatory Visit | Attending: Cardiology | Admitting: Cardiology

## 2019-07-29 DIAGNOSIS — R600 Localized edema: Secondary | ICD-10-CM | POA: Diagnosis not present

## 2019-07-29 DIAGNOSIS — R079 Chest pain, unspecified: Secondary | ICD-10-CM | POA: Diagnosis present

## 2019-07-29 DIAGNOSIS — R072 Precordial pain: Secondary | ICD-10-CM | POA: Diagnosis present

## 2019-07-29 LAB — COMPREHENSIVE METABOLIC PANEL
ALT: 14 IU/L (ref 0–32)
AST: 17 IU/L (ref 0–40)
Albumin/Globulin Ratio: 2.2 (ref 1.2–2.2)
Albumin: 4.4 g/dL (ref 3.7–4.7)
Alkaline Phosphatase: 58 IU/L (ref 39–117)
BUN/Creatinine Ratio: 18 (ref 12–28)
BUN: 10 mg/dL (ref 8–27)
Bilirubin Total: 0.4 mg/dL (ref 0.0–1.2)
CO2: 24 mmol/L (ref 20–29)
Calcium: 9.2 mg/dL (ref 8.7–10.3)
Chloride: 101 mmol/L (ref 96–106)
Creatinine, Ser: 0.56 mg/dL — ABNORMAL LOW (ref 0.57–1.00)
GFR calc Af Amer: 103 mL/min/{1.73_m2} (ref >59)
GFR calc non Af Amer: 90 mL/min/{1.73_m2} (ref >59)
Globulin, Total: 2 g/dL (ref 1.5–4.5)
Glucose: 138 mg/dL — ABNORMAL HIGH (ref 65–99)
Potassium: 3.9 mmol/L (ref 3.5–5.2)
Sodium: 139 mmol/L (ref 134–144)
Total Protein: 6.4 g/dL (ref 6.0–8.5)

## 2019-07-29 LAB — BRAIN NATRIURETIC PEPTIDE: BNP: 117.7 pg/mL — ABNORMAL HIGH (ref 0.0–100.0)

## 2019-07-29 NOTE — Progress Notes (Signed)
*  PRELIMINARY RESULTS* Echocardiogram 2D Echocardiogram has been performed.  Kristi Willis 07/29/2019, 11:39 AM

## 2019-07-29 NOTE — Progress Notes (Signed)
*  PRELIMINARY RESULTS* Echocardiogram 2D Echocardiogram has been performed.  Samuel Germany 07/29/2019, 10:23 AM

## 2019-08-02 ENCOUNTER — Other Ambulatory Visit: Payer: Self-pay

## 2019-08-02 MED ORDER — FUROSEMIDE 20 MG PO TABS: 20.0000 mg | ORAL_TABLET | Freq: Every day | ORAL | 11 refills | Status: DC | PRN

## 2019-08-03 ENCOUNTER — Telehealth: Payer: Self-pay | Admitting: Family

## 2019-08-03 NOTE — Telephone Encounter (Signed)
Daughter wants patient to have a referral to a pulmonologist because of problems she is having with her breathing.  Put in Kristi Willis's scheduled on 08/04/19 at 8:25 am for Kristi Willis to call [name] (Daughter) at [phone].

## 2019-08-04 ENCOUNTER — Inpatient Hospital Stay (HOSPITAL_COMMUNITY)
Admission: EM | Admit: 2019-08-04 | Discharge: 2019-08-07 | DRG: 193 | Disposition: A | Discharge status: Home Health | Payer: Medicare Other | Source: Ambulatory Visit | Attending: Family Medicine | Admitting: Family Medicine

## 2019-08-04 ENCOUNTER — Other Ambulatory Visit: Payer: Self-pay

## 2019-08-04 ENCOUNTER — Ambulatory Visit (HOSPITAL_COMMUNITY)
Admission: RE | Admit: 2019-08-04 | Discharge: 2019-08-04 | Disposition: A | Discharge status: Home / Self Care | Payer: Medicare Other | Source: Ambulatory Visit | Attending: Family | Admitting: Family

## 2019-08-04 ENCOUNTER — Emergency Department (HOSPITAL_COMMUNITY): Payer: Medicare Other

## 2019-08-04 ENCOUNTER — Other Ambulatory Visit (HOSPITAL_COMMUNITY)
Admission: RE | Admit: 2019-08-04 | Discharge: 2019-08-04 | Disposition: A | Discharge status: Home / Self Care | Payer: Medicare Other | Source: Ambulatory Visit | Attending: Family | Admitting: Family

## 2019-08-04 ENCOUNTER — Ambulatory Visit: Payer: Medicare Other | Admitting: Family

## 2019-08-04 ENCOUNTER — Encounter (HOSPITAL_COMMUNITY): Payer: Self-pay | Admitting: Emergency Medicine

## 2019-08-04 ENCOUNTER — Ambulatory Visit (INDEPENDENT_AMBULATORY_CARE_PROVIDER_SITE_OTHER): Payer: Medicare Other | Admitting: Family

## 2019-08-04 ENCOUNTER — Encounter: Payer: Self-pay | Admitting: Family

## 2019-08-04 DIAGNOSIS — R05 Cough: Secondary | ICD-10-CM

## 2019-08-04 DIAGNOSIS — B9689 Other specified bacterial agents as the cause of diseases classified elsewhere: Secondary | ICD-10-CM

## 2019-08-04 DIAGNOSIS — Z20828 Contact with and (suspected) exposure to other viral communicable diseases: Secondary | ICD-10-CM | POA: Diagnosis present

## 2019-08-04 DIAGNOSIS — R1310 Dysphagia, unspecified: Secondary | ICD-10-CM | POA: Diagnosis present

## 2019-08-04 DIAGNOSIS — Z79890 Hormone replacement therapy (postmenopausal): Secondary | ICD-10-CM | POA: Diagnosis not present

## 2019-08-04 DIAGNOSIS — R64 Cachexia: Secondary | ICD-10-CM | POA: Diagnosis present

## 2019-08-04 DIAGNOSIS — F0390 Unspecified dementia without behavioral disturbance: Secondary | ICD-10-CM | POA: Diagnosis present

## 2019-08-04 DIAGNOSIS — E119 Type 2 diabetes mellitus without complications: Secondary | ICD-10-CM

## 2019-08-04 DIAGNOSIS — Z6826 Body mass index (BMI) 26.0-26.9, adult: Secondary | ICD-10-CM | POA: Diagnosis not present

## 2019-08-04 DIAGNOSIS — R059 Cough, unspecified: Secondary | ICD-10-CM

## 2019-08-04 DIAGNOSIS — J208 Acute bronchitis due to other specified organisms: Secondary | ICD-10-CM | POA: Insufficient documentation

## 2019-08-04 DIAGNOSIS — E872 Acidosis: Secondary | ICD-10-CM | POA: Diagnosis present

## 2019-08-04 DIAGNOSIS — K219 Gastro-esophageal reflux disease without esophagitis: Secondary | ICD-10-CM | POA: Diagnosis present

## 2019-08-04 DIAGNOSIS — Z8249 Family history of ischemic heart disease and other diseases of the circulatory system: Secondary | ICD-10-CM

## 2019-08-04 DIAGNOSIS — E1165 Type 2 diabetes mellitus with hyperglycemia: Secondary | ICD-10-CM | POA: Diagnosis present

## 2019-08-04 DIAGNOSIS — J90 Pleural effusion, not elsewhere classified: Secondary | ICD-10-CM | POA: Diagnosis present

## 2019-08-04 DIAGNOSIS — J189 Pneumonia, unspecified organism: Principal | ICD-10-CM | POA: Diagnosis present

## 2019-08-04 DIAGNOSIS — E785 Hyperlipidemia, unspecified: Secondary | ICD-10-CM | POA: Diagnosis present

## 2019-08-04 DIAGNOSIS — I10 Essential (primary) hypertension: Secondary | ICD-10-CM | POA: Diagnosis present

## 2019-08-04 DIAGNOSIS — J45909 Unspecified asthma, uncomplicated: Secondary | ICD-10-CM | POA: Diagnosis present

## 2019-08-04 DIAGNOSIS — E871 Hypo-osmolality and hyponatremia: Secondary | ICD-10-CM | POA: Diagnosis present

## 2019-08-04 DIAGNOSIS — D649 Anemia, unspecified: Secondary | ICD-10-CM | POA: Diagnosis present

## 2019-08-04 DIAGNOSIS — R410 Disorientation, unspecified: Secondary | ICD-10-CM

## 2019-08-04 DIAGNOSIS — Z7982 Long term (current) use of aspirin: Secondary | ICD-10-CM

## 2019-08-04 DIAGNOSIS — R9431 Abnormal electrocardiogram [ECG] [EKG]: Secondary | ICD-10-CM | POA: Diagnosis present

## 2019-08-04 DIAGNOSIS — E1140 Type 2 diabetes mellitus with diabetic neuropathy, unspecified: Secondary | ICD-10-CM | POA: Diagnosis present

## 2019-08-04 DIAGNOSIS — R0602 Shortness of breath: Secondary | ICD-10-CM | POA: Diagnosis not present

## 2019-08-04 DIAGNOSIS — R531 Weakness: Secondary | ICD-10-CM

## 2019-08-04 DIAGNOSIS — Z882 Allergy status to sulfonamides status: Secondary | ICD-10-CM

## 2019-08-04 DIAGNOSIS — Z7984 Long term (current) use of oral hypoglycemic drugs: Secondary | ICD-10-CM

## 2019-08-04 DIAGNOSIS — D508 Other iron deficiency anemias: Secondary | ICD-10-CM | POA: Diagnosis not present

## 2019-08-04 DIAGNOSIS — Z85828 Personal history of other malignant neoplasm of skin: Secondary | ICD-10-CM

## 2019-08-04 DIAGNOSIS — D509 Iron deficiency anemia, unspecified: Secondary | ICD-10-CM

## 2019-08-04 DIAGNOSIS — E039 Hypothyroidism, unspecified: Secondary | ICD-10-CM | POA: Diagnosis present

## 2019-08-04 DIAGNOSIS — R4701 Aphasia: Secondary | ICD-10-CM | POA: Diagnosis present

## 2019-08-04 DIAGNOSIS — Z888 Allergy status to other drugs, medicaments and biological substances status: Secondary | ICD-10-CM

## 2019-08-04 DIAGNOSIS — I313 Pericardial effusion (noninflammatory): Secondary | ICD-10-CM | POA: Diagnosis present

## 2019-08-04 DIAGNOSIS — Z79899 Other long term (current) drug therapy: Secondary | ICD-10-CM

## 2019-08-04 DIAGNOSIS — G9341 Metabolic encephalopathy: Secondary | ICD-10-CM | POA: Diagnosis present

## 2019-08-04 DIAGNOSIS — Z885 Allergy status to narcotic agent status: Secondary | ICD-10-CM

## 2019-08-04 DIAGNOSIS — R739 Hyperglycemia, unspecified: Secondary | ICD-10-CM

## 2019-08-04 LAB — COMPREHENSIVE METABOLIC PANEL
ALT: 15 U/L (ref 0–44)
AST: 20 U/L (ref 15–41)
Albumin: 3.3 g/dL — ABNORMAL LOW (ref 3.5–5.0)
Alkaline Phosphatase: 43 U/L (ref 38–126)
Anion gap: 12 (ref 5–15)
BUN: 11 mg/dL (ref 8–23)
CO2: 22 mmol/L (ref 22–32)
Calcium: 8.5 mg/dL — ABNORMAL LOW (ref 8.9–10.3)
Chloride: 94 mmol/L — ABNORMAL LOW (ref 98–111)
Creatinine, Ser: 0.65 mg/dL (ref 0.44–1.00)
GFR calc Af Amer: >60 mL/min (ref >60)
GFR calc non Af Amer: >60 mL/min (ref >60)
Glucose, Bld: 282 mg/dL — ABNORMAL HIGH (ref 70–99)
Potassium: 3.8 mmol/L (ref 3.5–5.1)
Sodium: 128 mmol/L — ABNORMAL LOW (ref 135–145)
Total Bilirubin: 1.1 mg/dL (ref 0.3–1.2)
Total Protein: 6.4 g/dL — ABNORMAL LOW (ref 6.5–8.1)

## 2019-08-04 LAB — CBC WITH DIFFERENTIAL/PLATELET
Abs Immature Granulocytes: 0.04 10*3/uL (ref 0.00–0.07)
Abs Immature Granulocytes: 0.06 10*3/uL (ref 0.00–0.07)
Basophils Absolute: 0 10*3/uL (ref 0.0–0.1)
Basophils Absolute: 0 10*3/uL (ref 0.0–0.1)
Basophils Relative: 0 %
Basophils Relative: 0 %
Eosinophils Absolute: 0 10*3/uL (ref 0.0–0.5)
Eosinophils Absolute: 0 10*3/uL (ref 0.0–0.5)
Eosinophils Relative: 0 %
Eosinophils Relative: 0 %
HCT: 29.3 % — ABNORMAL LOW (ref 36.0–46.0)
HCT: 29.3 % — ABNORMAL LOW (ref 36.0–46.0)
Hemoglobin: 9.6 g/dL — ABNORMAL LOW (ref 12.0–15.0)
Hemoglobin: 9.6 g/dL — ABNORMAL LOW (ref 12.0–15.0)
Immature Granulocytes: 0 %
Immature Granulocytes: 1 %
Lymphocytes Relative: 11 %
Lymphocytes Relative: 6 %
Lymphs Abs: 1 10*3/uL (ref 0.7–4.0)
Lymphs Abs: 0.5 10*3/uL — ABNORMAL LOW (ref 0.7–4.0)
MCH: 28.3 pg (ref 26.0–34.0)
MCH: 28.1 pg (ref 26.0–34.0)
MCHC: 32.8 g/dL (ref 30.0–36.0)
MCHC: 32.8 g/dL (ref 30.0–36.0)
MCV: 86.4 fL (ref 80.0–100.0)
MCV: 85.7 fL (ref 80.0–100.0)
Monocytes Absolute: 0.9 10*3/uL (ref 0.1–1.0)
Monocytes Absolute: 0.4 10*3/uL (ref 0.1–1.0)
Monocytes Relative: 9 %
Monocytes Relative: 4 %
Neutro Abs: 7.6 10*3/uL (ref 1.7–7.7)
Neutro Abs: 7.9 10*3/uL — ABNORMAL HIGH (ref 1.7–7.7)
Neutrophils Relative %: 80 %
Neutrophils Relative %: 89 %
Platelets: 176 10*3/uL (ref 150–400)
Platelets: 166 10*3/uL (ref 150–400)
RBC: 3.39 MIL/uL — ABNORMAL LOW (ref 3.87–5.11)
RBC: 3.42 MIL/uL — ABNORMAL LOW (ref 3.87–5.11)
RDW: 13.4 % (ref 11.5–15.5)
RDW: 13.2 % (ref 11.5–15.5)
WBC: 9.6 10*3/uL (ref 4.0–10.5)
WBC: 8.9 10*3/uL (ref 4.0–10.5)
nRBC: 0 % (ref 0.0–0.2)
nRBC: 0 % (ref 0.0–0.2)

## 2019-08-04 LAB — CBG MONITORING, ED
Glucose-Capillary: 234 mg/dL — ABNORMAL HIGH (ref 70–99)
Glucose-Capillary: 215 mg/dL — ABNORMAL HIGH (ref 70–99)

## 2019-08-04 LAB — URINALYSIS, ROUTINE W REFLEX MICROSCOPIC
Bacteria, UA: NONE SEEN
Bilirubin Urine: NEGATIVE
Glucose, UA: 150 mg/dL — AB
Ketones, ur: NEGATIVE mg/dL
Leukocytes,Ua: NEGATIVE
Nitrite: NEGATIVE
Protein, ur: 30 mg/dL — AB
Specific Gravity, Urine: 1.011 (ref 1.005–1.030)
pH: 5 (ref 5.0–8.0)

## 2019-08-04 LAB — MAGNESIUM: Magnesium: 1.6 mg/dL — ABNORMAL LOW (ref 1.7–2.4)

## 2019-08-04 LAB — TRIGLYCERIDES: Triglycerides: 57 mg/dL (ref <150)

## 2019-08-04 LAB — BASIC METABOLIC PANEL
Anion gap: 13 (ref 5–15)
BUN: 12 mg/dL (ref 8–23)
CO2: 25 mmol/L (ref 22–32)
Calcium: 8.6 mg/dL — ABNORMAL LOW (ref 8.9–10.3)
Chloride: 90 mmol/L — ABNORMAL LOW (ref 98–111)
Creatinine, Ser: 0.69 mg/dL (ref 0.44–1.00)
GFR calc Af Amer: >60 mL/min (ref >60)
GFR calc non Af Amer: >60 mL/min (ref >60)
Glucose, Bld: 278 mg/dL — ABNORMAL HIGH (ref 70–99)
Potassium: 3.6 mmol/L (ref 3.5–5.1)
Sodium: 128 mmol/L — ABNORMAL LOW (ref 135–145)

## 2019-08-04 LAB — PROTIME-INR
INR: 1.2 (ref 0.8–1.2)
Prothrombin Time: 15.1 seconds (ref 11.4–15.2)

## 2019-08-04 LAB — POC SARS CORONAVIRUS 2 AG -  ED: SARS Coronavirus 2 Ag: NEGATIVE

## 2019-08-04 LAB — C-REACTIVE PROTEIN: CRP: 16.7 mg/dL — ABNORMAL HIGH (ref <1.0)

## 2019-08-04 LAB — TROPONIN I (HIGH SENSITIVITY)
Troponin I (High Sensitivity): 11 ng/L (ref <18)
Troponin I (High Sensitivity): 10 ng/L (ref <18)

## 2019-08-04 LAB — BRAIN NATRIURETIC PEPTIDE: B Natriuretic Peptide: 291.2 pg/mL — ABNORMAL HIGH (ref 0.0–100.0)

## 2019-08-04 LAB — D-DIMER, QUANTITATIVE: D-Dimer, Quant: 1.19 ug/mL-FEU — ABNORMAL HIGH (ref 0.00–0.50)

## 2019-08-04 LAB — LACTIC ACID, PLASMA
Lactic Acid, Venous: 2 mmol/L (ref 0.5–1.9)
Lactic Acid, Venous: 1 mmol/L (ref 0.5–1.9)

## 2019-08-04 LAB — LACTATE DEHYDROGENASE: LDH: 159 U/L (ref 98–192)

## 2019-08-04 LAB — FIBRINOGEN: Fibrinogen: 633 mg/dL — ABNORMAL HIGH (ref 210–475)

## 2019-08-04 LAB — APTT: aPTT: 35 seconds (ref 24–36)

## 2019-08-04 LAB — PROCALCITONIN: Procalcitonin: 0.12 ng/mL

## 2019-08-04 LAB — FERRITIN: Ferritin: 124 ng/mL (ref 11–307)

## 2019-08-04 MED ORDER — ACETAMINOPHEN 325 MG PO TABS
650.0000 mg | ORAL_TABLET | Freq: Four times a day (QID) | ORAL | Status: DC | PRN
  Administered 2019-08-07: 650 mg via ORAL
  Filled 2019-08-04: qty 2

## 2019-08-04 MED ORDER — ACETAMINOPHEN 650 MG RE SUPP: 650.0000 mg | Freq: Four times a day (QID) | RECTAL | Status: DC | PRN

## 2019-08-04 MED ORDER — SODIUM CHLORIDE 0.9 % IV SOLN
500.0000 mg | INTRAVENOUS | Status: DC
  Administered 2019-08-04: 500 mg via INTRAVENOUS
  Filled 2019-08-04: qty 500

## 2019-08-04 MED ORDER — PREDNISONE 10 MG (21) PO TBPK: ORAL_TABLET | ORAL | 0 refills | Status: DC

## 2019-08-04 MED ORDER — INSULIN ASPART 100 UNIT/ML ~~LOC~~ SOLN
0.0000 [IU] | Freq: Three times a day (TID) | SUBCUTANEOUS | Status: DC
  Administered 2019-08-05: 3 [IU] via SUBCUTANEOUS
  Administered 2019-08-05: 7 [IU] via SUBCUTANEOUS
  Administered 2019-08-05 – 2019-08-06 (×2): 2 [IU] via SUBCUTANEOUS

## 2019-08-04 MED ORDER — SODIUM CHLORIDE 0.9 % IV BOLUS
500.0000 mL | Freq: Once | INTRAVENOUS | Status: AC
  Administered 2019-08-04: 500 mL via INTRAVENOUS

## 2019-08-04 MED ORDER — INSULIN ASPART 100 UNIT/ML ~~LOC~~ SOLN
0.0000 [IU] | Freq: Every day | SUBCUTANEOUS | Status: DC
  Administered 2019-08-04: 2 [IU] via SUBCUTANEOUS
  Administered 2019-08-06: 4 [IU] via SUBCUTANEOUS

## 2019-08-04 MED ORDER — ALBUTEROL SULFATE HFA 108 (90 BASE) MCG/ACT IN AERS: 6.0000 | INHALATION_SPRAY | Freq: Once | RESPIRATORY_TRACT | Status: DC

## 2019-08-04 MED ORDER — IPRATROPIUM BROMIDE HFA 17 MCG/ACT IN AERS: 6.0000 | INHALATION_SPRAY | Freq: Once | RESPIRATORY_TRACT | Status: DC

## 2019-08-04 MED ORDER — ACETAMINOPHEN 325 MG PO TABS
650.0000 mg | ORAL_TABLET | Freq: Once | ORAL | Status: DC
  Filled 2019-08-04: qty 2

## 2019-08-04 MED ORDER — IOHEXOL 350 MG/ML SOLN
100.0000 mL | Freq: Once | INTRAVENOUS | Status: AC | PRN
  Administered 2019-08-04: 100 mL via INTRAVENOUS

## 2019-08-04 MED ORDER — SODIUM CHLORIDE 0.9 % IV SOLN
2.0000 g | INTRAVENOUS | Status: DC
  Administered 2019-08-04: 2 g via INTRAVENOUS
  Filled 2019-08-04: qty 20

## 2019-08-04 MED ORDER — DOXYCYCLINE HYCLATE 100 MG PO TABS: 100.0000 mg | ORAL_TABLET | Freq: Two times a day (BID) | ORAL | 0 refills | Status: DC

## 2019-08-04 NOTE — ED Triage Notes (Signed)
Pt sent here by her doctor for a sodium of 128 and a glucose of 278 , pt was negative for covid today and already had an xray out pt but it has not resulted

## 2019-08-04 NOTE — H&P (Signed)
History and Physical    Kristi Willis:[id number] DOB: [birth year] DOA: 08/04/2019  PCP: Sharion Balloon, FNP Patient coming from: Home  Chief Complaint: Fever, shortness of breath  HPI: Kristi Willis is a 78 y.o. female with medical history significant of asthma, type 2 diabetes, neuropathy, GERD, hypertension, hyperlipidemia, history of SVT presenting to the ED for evaluation of fever and shortness of breath.  Patient is currently very somnolent and no history could be obtained from her.  Daughter at bedside states patient has been diagnosed with aphasia and has difficulty speaking at baseline.  States she has never had a stroke but has been seen by a neurologist and diagnosed with aphasia.  States 2 weeks ago patient started having difficulty breathing which she thought was due to asthma.  She was seen by an allergy specialist who did not feel that asthma was causing her shortness of breath.  He referred her to cardiology.  Patient was seen by a cardiologist and told her shortness of breath was not related to her heart.  Daughter states patient's shortness of breath has been getting progressively worse for the past 2 weeks.  Today she had a fever of 100.4.  She has been very lethargic and more confused for the past few days.  This morning patient had a rapid Covid test done which was negative.  ED Course: SARS-CoV-2 rapid antigen test negative, PCR test pending.  Temperature 103.1 F.  Oxygen saturation 92% on room air.  No leukocytosis.  Lactic acid 2.0>1.0.  UA not suggestive of infection.  Urine culture pending.  Blood culture x2 pending.  High-sensitivity troponin x2 negative.  Inflammatory markers elevated: D-dimer 1.19, fibrinogen 633, CRP 16.7.  Procalcitonin 0.12.  BNP 291. Chest x-ray showing patchy consolidation in the inferior right upper lobe and small left pleural effusion. CT angiogram showing dense consolidation anteriorly in the right upper lobe and groundglass  opacities in the right lower lobe.  Small bilateral pleural effusions, right greater than left.  Mildly enlarged mediastinal lymph nodes, likely reactive.  Cardiomegaly and small pericardial effusion.  No evidence of PE. Patient received ceftriaxone, azithromycin, and a 500 cc normal saline bolus.  Review of Systems:  All systems reviewed and apart from history of presenting illness, are negative.  Past Medical History:  Diagnosis Date  . Allergy   . Asthma   . Cancer ([place])    skin  . Diabetes mellitus   . GERD (gastroesophageal reflux disease)    hiatal hernia  . Hypercholesteremia   . Hypertension   . Neuromuscular disorder (HCC)    DM neuropathy  . Neuropathy   . SVT (supraventricular tachycardia) (HCC)     Past Surgical History:  Procedure Laterality Date  . BREAST BIOPSY     left breast biopsy  . LUMBAR LAMINECTOMY  08/10  . SKIN CANCER EXCISION  2005  . SPINE SURGERY     lumbar laminectomy     reports that she has never smoked. She has never used smokeless tobacco. She reports that she does not drink alcohol or use drugs.  Allergies  Allergen Reactions  . Dimetapp C [Phenylephrine-Bromphen-Codeine] Itching  . Phenylephrine Hcl Other (See Comments)    unknown  . Ultram [Tramadol Hcl] Other (See Comments)    Insomnia  . Celebrex [Celecoxib] Rash  . Cortisone Rash  . Fenofibrate Rash  . Sulfa Antibiotics Rash    Family History  Problem Relation Age of Onset  . Cancer Mother 65  cervical cancer  . Depression Mother   . Heart disease Father   . Stroke Father   . Cancer Father        lung cancer  . Heart disease Sister 5       MI  . Hypertension Sister   . Cancer Sister 21       breast cancer  . Healthy Daughter     Prior to Admission medications   Medication Sig Start Date End Date Taking? Authorizing Provider  ACCU-CHEK AVIVA PLUS test strip CHECK BLOOD SUGAR UP TO 4 TIMES A DAY OR AS DIRECTED 10/28/16   Dettinger, Fransisca Kaufmann, MD  ACCU-CHEK  SOFTCLIX LANCETS lancets CHECK BLOOD SUGAR UP TO 4 TIMES A DAY OR AS DIRECTED 10/28/16   Dettinger, Fransisca Kaufmann, MD  acyclovir ointment (ZOVIRAX) 5 % APPLY TO THE AFFECTED AREA AROUND MOUTH EVERY 3 HOURS AS DIRECTED UNTIL CLEAR 05/31/19   Hawks, Alyse Low A, FNP  albuterol (VENTOLIN HFA) 108 (90 BASE) MCG/ACT inhaler Inhale 2 puffs into the lungs every 6 (six) hours as needed for wheezing. 04/14/15   Claretta Fraise, MD  amLODipine (NORVASC) 5 MG tablet TAKE 1 TABLET DAILY. 03/24/19   Sharion Balloon, FNP  aspirin 81 MG chewable tablet Chew 81 mg by mouth daily.    [provider]  augmented betamethasone dipropionate (DIPROLENE-AF) 0.05 % cream APPLY TO AFFECTED AREA ON BACK AND NECK TWICE A DAY PRN. (NOT TO FACE, GROIN OR UNDERARMS) 02/13/19   [provider]  azelastine (ASTELIN) 0.1 % nasal spray Place 2 sprays into both nostrils 2 (two) times daily. 08/24/14   Lysbeth Penner, FNP  Blood Glucose Monitoring Suppl (ACCU-CHEK AVIVA PLUS) w/Device KIT CHECK BLOOD SUGAR UP TO 4 TIMES A DAY 03/01/16   Claretta Fraise, MD  Calcium Carbonate-Vitamin D (CALTRATE 600+D) 600-400 MG-UNIT per tablet Take 1 tablet by mouth 2 (two) times daily.    [provider]  doxazosin (CARDURA) 8 MG tablet TAKE (1) TABLET DAILY FOR HIGH BLOOD PRESSURE. 06/29/19   Evelina Dun A, FNP  doxycycline (VIBRA-TABS) 100 MG tablet Take 1 tablet (100 mg total) by mouth 2 (two) times daily. 08/04/19   Sharion Balloon, FNP  EPIPEN 2-PAK 0.3 MG/0.3ML SOAJ injection Reported on 01/24/2016 03/07/14   [provider]  escitalopram (LEXAPRO) 20 MG tablet Take 1 tablet (20 mg total) by mouth daily. 04/06/19   Sharion Balloon, FNP  furosemide (LASIX) 20 MG tablet Take 1 tablet (20 mg total) by mouth daily as needed for fluid (Weight gain of 3 lbs in a day or 5 lbs in a week). 08/02/19 10/31/19  Donato Heinz, MD  gabapentin (NEURONTIN) 100 MG capsule Take 1 capsule (100 mg total) 3 (three) times daily by  mouth. 07/03/17   Sharion Balloon, FNP  ketoconazole (NIZORAL) 2 % cream APPLY TO THE AFFECTED AREA(S) DAILY AS DIRECTED 05/31/19   Sharion Balloon, FNP  levothyroxine (SYNTHROID) 75 MCG tablet Take 1 tablet (75 mcg total) by mouth daily before breakfast. 06/28/19   Nida, Marella Chimes, MD  lisinopril (ZESTRIL) 40 MG tablet TAKE 1 TABLET DAILY 06/28/19   Evelina Dun A, FNP  loratadine (CLARITIN) 10 MG tablet Take 1 tablet (10 mg total) by mouth daily. 08/24/14   Lysbeth Penner, FNP  metFORMIN (GLUCOPHAGE) 1000 MG tablet TAKE  (1)  TABLET TWICE A DAY WITH MEALS (BREAKFAST AND SUPPER) 07/20/19   Evelina Dun A, FNP  metoprolol succinate (TOPROL-XL) 100 MG 24 hr  tablet TAKE (1) TABLET TWICE A DAY. 07/20/19   Evelina Dun A, FNP  montelukast (SINGULAIR) 10 MG tablet Take 1 tablet (10 mg total) by mouth at bedtime. 04/14/15   Claretta Fraise, MD  Multiple Vitamin (MULTIVITAMIN) tablet Take 1 tablet by mouth daily.      [provider]  Omega-3 Fatty Acids (FISH OIL) 1200 MG CAPS Take by mouth.    [provider]  permethrin (ELIMITE) 5 % cream APPLY TO AFFECTED AREA FROM THE CHIN DOWN AT NIGHT ON DAY 1, 2 & 7. [place] UPON AWAKING. AS DIRECTED 02/13/19   [provider]  predniSONE (STERAPRED UNI-PAK 21 TAB) 10 MG (21) TBPK tablet Use as directed 08/04/19   Evelina Dun A, FNP  Pyridoxine HCl (VITAMIN B-6 Willis) Take 1 tablet by mouth 2 (two) times daily. Reported on 01/24/2016    [provider]  rosuvastatin (CRESTOR) 20 MG tablet Take 1 tablet (20 mg total) by mouth at bedtime. 06/24/19 06/23/20  Sharion Balloon, FNP  sitaGLIPtin (JANUVIA) 50 MG tablet TAKE 1 TABLET DAILY 07/26/19   Cassandria Anger, MD  vitamin B-12 (CYANOCOBALAMIN) 500 MCG tablet Take 500 mcg by mouth daily.      [provider]  Vitamin D, Ergocalciferol, (DRISDOL) 1.25 MG (50000 UT) CAPS capsule TAKE 1 CAPSULE ONCE A WEEK 09/08/18   Sharion Balloon, FNP    Physical Exam: Vitals:    08/05/19 0100 08/05/19 0133 08/05/19 0200 08/05/19 0300  BP: (!) 147/59  140/64 (!) 148/47  Pulse: 70 71 70 77  Resp:   15 18  Temp:      TempSrc:      SpO2: 96% 96% 94% 92%  Weight:      Height:        Physical Exam  Constitutional: She appears well-developed and well-nourished. No distress.  HENT:  Head: Normocephalic.  Eyes: Right eye exhibits no discharge. Left eye exhibits no discharge.  Cardiovascular: Normal rate, regular rhythm and intact distal pulses.  Pulmonary/Chest: Effort normal. She has no wheezes. She has no rales.  Equal air entry bilaterally upon auscultation of anterior lung fields  Abdominal: Soft. Bowel sounds are normal. She exhibits no distension. There is no abdominal tenderness. There is no guarding.  Musculoskeletal:     Cervical back: Neck supple.     Comments: Trace bilateral lower extremity edema  Neurological:  Very somnolent but waking up intermittently and moving all extremities on command.  No focal weakness.  Skin: Skin is warm and dry. She is not diaphoretic.     Labs on Admission: I have personally reviewed following labs and imaging studies  CBC: Recent Labs  Lab 08/04/19 1128 08/04/19 1650  WBC 9.6 8.9  NEUTROABS 7.6 7.9*  HGB 9.6* 9.6*  HCT 29.3* 29.3*  MCV 86.4 85.7  PLT 176 272   Basic Metabolic Panel: Recent Labs  Lab 08/04/19 1128 08/04/19 1650  NA 128* 128*  K 3.6 3.8  CL 90* 94*  CO2 25 22  GLUCOSE 278* 282*  BUN 12 11  CREATININE 0.69 0.65  CALCIUM 8.6* 8.5*  MG  --  1.6*   GFR: Estimated Creatinine Clearance: 57.8 mL/min (by C-G formula based on SCr of 0.65 mg/dL). Liver Function Tests: Recent Labs  Lab 08/04/19 1650  AST 20  ALT 15  ALKPHOS 43  BILITOT 1.1  PROT 6.4*  ALBUMIN 3.3*   No results for input(s): LIPASE, AMYLASE in the last 168 hours. No results for input(s): AMMONIA  in the last 168 hours. Coagulation Profile: Recent Labs  Lab 08/04/19 1650  INR 1.2   Cardiac Enzymes: No results  for input(s): CKTOTAL, CKMB, CKMBINDEX, TROPONINI in the last 168 hours. BNP (last 3 results) No results for input(s): PROBNP in the last 8760 hours. HbA1C: No results for input(s): HGBA1C in the last 72 hours. CBG: Recent Labs  Lab 08/04/19 2133 08/04/19 2328  GLUCAP 234* 215*   Lipid Profile: Recent Labs    08/04/19 1650  TRIG 57   Thyroid Function Tests: No results for input(s): TSH, T4TOTAL, FREET4, T3FREE, THYROIDAB in the last 72 hours. Anemia Panel: Recent Labs    08/04/19 1650  FERRITIN 124   Urine analysis:    Component Value Date/Time   COLORURINE YELLOW 08/04/2019 [place] 08/04/2019 1645   LABSPEC 1.011 08/04/2019 1645   PHURINE 5.0 08/04/2019 1645   GLUCOSEU 150 (A) 08/04/2019 1645   HGBUR SMALL (A) 08/04/2019 [place] 08/04/2019 [place] 08/04/2019 1645   PROTEINUR 30 (A) 08/04/2019 1645   UROBILINOGEN 0.2 02/14/2012 0836   NITRITE NEGATIVE 08/04/2019 1645   LEUKOCYTESUR NEGATIVE 08/04/2019 1645    Radiological Exams on Admission: DG Chest 2 View  Result Date: 08/04/2019 CLINICAL DATA:  Fever and shortness of breath. Aphasia. Cough for several days. EXAM: CHEST - 2 VIEW COMPARISON:  07/20/2019 FINDINGS: Stable enlarged cardiac silhouette. Interval patchy consolidation in the inferior aspect of the right upper lobe. Stable small amount of patchy and linear density at the left lung base. The previously demonstrated small bilateral pleural effusions are not currently visualized with exception of possible minimal pleural fluid on the left. Thoracic spine degenerative changes. IMPRESSION: 1. Interval right upper lobe pneumonia. 2. Stable mild left basilar pneumonia or patchy atelectasis. 3. Resolved pleural fluid on the right and decreased pleural fluid on the left. Electronically Signed   By: Claudie Revering M.D.   On: 08/04/2019 14:45   CT HEAD WO CONTRAST  Result Date: 08/05/2019 CLINICAL DATA:   Encephalopathy. EXAM: CT HEAD WITHOUT CONTRAST TECHNIQUE: Contiguous axial images were obtained from the base of the skull through the vertex without intravenous contrast. Patient received IV contrast for chest CT 6 hours prior. COMPARISON:  Brain MRI 04/17/2016 FINDINGS: Brain: No intracranial hemorrhage, mass effect, or midline shift. Brain volume is normal for age. No hydrocephalus. Incidental cavum septum pellucidum. The basilar cisterns are patent. No evidence of territorial infarct or acute ischemia. No extra-axial or intracranial fluid collection. Vascular: No hyperdense vessel. Slight residual intravascular contrast from prior chest CT. Skull: No fracture or focal lesion. Sinuses/Orbits: Minor mucosal thickening of left maxillary sinus. No sinus fluid level. Mastoid air cells are clear. Bilateral cataract resection. Other: None. IMPRESSION: No acute intracranial abnormality. Electronically Signed   By: Keith Rake M.D.   On: 08/05/2019 01:10   CT Angio Chest PE W and/or Wo Contrast  Result Date: 08/04/2019 CLINICAL DATA:  Shortness of breath EXAM: CT ANGIOGRAPHY CHEST WITH CONTRAST TECHNIQUE: Multidetector CT imaging of the chest was performed using the standard protocol during bolus administration of intravenous contrast. Multiplanar CT image reconstructions and MIPs were obtained to evaluate the vascular anatomy. CONTRAST:  138m OMNIPAQUE IOHEXOL 350 MG/ML SOLN COMPARISON:  Chest x-ray today FINDINGS: Cardiovascular: No filling defects in the pulmonary arteries to suggest pulmonary emboli. Heart is mildly enlarged. Aorta is normal caliber with scattered calcifications in the aorta and coronary arteries, best seen in the right coronary artery. Small pericardial  effusion. Mediastinum/Nodes: Mildly enlarged right paratracheal lymph node measures up to 14 mm in short axis diameter. Borderline and mildly enlarged prevascular, right paratracheal and AP window lymph nodes. Lungs/Pleura: Small bilateral  pleural effusions, right greater than left. Dense consolidation in the anterior right upper lobe. Ground-glass opacities also noted in the right lower lobe. Findings most compatible with pneumonia. No confluent opacity on the left. Upper Abdomen: Imaging into the upper abdomen shows no acute findings. Musculoskeletal: Chest wall soft tissues are unremarkable. No acute bony abnormality. Review of the MIP images confirms the above findings. IMPRESSION: Dense consolidation anteriorly in the right upper lobe. Ground-glass opacities in the right lower lobe. Findings most compatible with pneumonia. Followup PA and lateral chest X-ray is recommended in 3-4 weeks following trial of antibiotic therapy to ensure resolution and exclude underlying malignancy. For Small bilateral pleural effusions, right greater than left. Mildly enlarged mediastinal lymph nodes, likely reactive. Cardiomegaly.  Small pericardial effusion. No evidence of pulmonary embolus. Electronically Signed   By: Rolm Baptise M.D.   On: 08/04/2019 19:33   DG Chest Port 1 View  Result Date: 08/04/2019 CLINICAL DATA:  Cough, fever, negative COVID-19, recent diagnosis of pneumonia EXAM: PORTABLE CHEST 1 VIEW COMPARISON:  Radiograph 08/04/2019 FINDINGS: Persistent patchy consolidation in the inferior right upper lobe better seen on PA and lateral radiograph earlier the same day. Suspect small left pleural effusion. No pneumothorax. Mild cardiomegaly is similar to prior. No acute osseous or soft tissue abnormality. Metallic foreign bodies project over the base of the right neck. IMPRESSION: 1. Persistent patchy consolidation in the inferior right upper lobe. 2. Suspect small left pleural effusion. 3. Stable cardiomegaly. 4. Metallic foreign bodies project over the base of the right neck, correlate with visual inspection. Electronically Signed   By: Lovena Le M.D.   On: 08/04/2019 16:43    EKG: Independently reviewed.  Normal sinus rhythm, right axis  deviation, QTC 501.  Assessment/Plan Principal Problem:   Pneumonia Active Problems:   Acute metabolic encephalopathy   Type 2 diabetes mellitus (HCC)   Anemia   QT prolongation   Multifocal pneumonia, suspect related to viral etiology Oxygen saturation in the mid 90s on room air.  No increased work of breathing.  Patient is febrile.  CT angiogram showing dense consolidation anteriorly in the right upper lobe and groundglass opacities in the right lower lobe.  No evidence of PE.  Inflammatory markers elevated, however, SARS-CoV-2 antigen and PCR test negative.  Bacterial pneumonia less likely given no leukocytosis or significant elevation of procalcitonin.  Lactic acid borderline elevated but no other signs of sepsis. -Received ceftriaxone and azithromycin in the ED.  Bacterial pneumonia less likely given no fever or significant elevation of procalcitonin (0.12).  Discontinue antibiotics. -Check influenza panel and respiratory viral panel -Blood culture x2 pending -Tylenol as needed -Supplemental oxygen as needed to keep oxygen saturation above 92% -Continuous pulse ox  Acute metabolic encephalopathy Likely related to pneumonia.  Head CT negative for acute intracranial abnormality.  No focal neuro deficit. -Management of pneumonia as mentioned above -Continue to monitor mental status  Mild lactic acidosis (resolved) Could possibly be due to dehydration.  Improved with a small fluid bolus. Lactic acid 2.0 >1.0.   Pericardial and pleural effusions CT showing small bilateral pleural effusions, right greater than left and a small pericardial effusion.  BNP mildly elevated at 291.  Echo done 07/29/2019 showing normal systolic function.  TSH checked on 11/2 was normal.  Small bilateral pleural effusions are  seen on prior chest x-ray from 12/1 as well. -Will hold off Lasix at this time  Non-insulin-dependent diabetes mellitus Last A1c 7.0 on 11/2. -Sliding scale insulin sensitive with  meals and CBG checks  Mild hyponatremia Corrected sodium 131.  Likely related to home diuretic use.  Normocytic anemia Hemoglobin 9.6, MCV 85.  Hemoglobin was 10.8 two weeks ago.  No signs of active bleeding. -Check iron, ferritin, TIBC  QT prolongation on EKG -Cardiac monitoring -Monitor potassium and magnesium levels -Avoid QT prolonging drugs if possible -Repeat EKG in a.m.  Pharmacy med rec pending.  DVT prophylaxis: Lovenox Code Status: Full code.  Discussed with the patient's daughter. Family Communication: Daughter at bedside. Disposition Plan: Anticipate discharge after clinical improvement. Consults called: None Admission status: It is my clinical opinion that admission to INPATIENT is reasonable and necessary in this 78 y.o. female . presenting with multifocal pneumonia, suspect related to viral etiology.  Given the aforementioned, the predictability of an adverse outcome is felt to be significant. I expect that the patient will require at least 2 midnights in the hospital to treat this condition.   The medical decision making on this patient was of high complexity and the patient is at high risk for clinical deterioration, therefore this is a level 3 visit.  Shela Leff MD [facility] Pager [phone]  If 7PM-7AM, please contact night-coverage [email or web address] Password [facility]  08/05/2019, 3:49 AM

## 2019-08-04 NOTE — Progress Notes (Signed)
Sepsis bundle Complete

## 2019-08-04 NOTE — Progress Notes (Signed)
Virtual Visit via telephone Note Due to COVID-19 pandemic this visit was conducted virtually. This visit type was conducted due to national recommendations for restrictions regarding the COVID-19 Pandemic (e.g. social distancing, sheltering in place) in an effort to limit this patient's exposure and mitigate transmission in our community. All issues noted in this document were discussed and addressed.  A physical exam was not performed with this format.  I connected with Kristi Willis daughter on 08/04/19 at 8:55 AM by telephone and verified that I am speaking with the correct person using two identifiers. Kristi Willis is currently located at home and daughter is currently with her during visit. The provider, Evelina Dun, FNP is located in their office at time of visit.  I discussed the limitations, risks, security and privacy concerns of performing an evaluation and management service by telephone and the availability of in person appointments. I also discussed with the patient that there may be a patient responsible charge related to this service. The patient expressed understanding and agreed to proceed.   History and Present Illness:  Shortness of Breath This is a recurrent problem. The current episode started yesterday. The problem occurs constantly. The problem has been rapidly worsening. Associated symptoms include a fever and wheezing. Pertinent negatives include no ear pain, headaches, leg swelling, neck pain, orthopnea, rhinorrhea or sore throat. The symptoms are aggravated by pollens and URIs. Associated symptoms comments: confused. She has tried ipratropium inhalers and rest for the symptoms. The treatment provided mild relief. Her past medical history is significant for asthma.      Review of Systems  Constitutional: Positive for fever.  HENT: Negative for ear pain, rhinorrhea and sore throat.   Respiratory: Positive for shortness of breath and wheezing.     Cardiovascular: Negative for orthopnea and leg swelling.  Musculoskeletal: Negative for neck pain.  Neurological: Negative for headaches.  All other systems reviewed and are negative.    Observations/Objective: Daughter did all the talking  Assessment and Plan: 1. Cough - CBC with Differential/Platelet - BMP8+EGFR - DG Chest 2 View; Future - doxycycline (VIBRA-TABS) 100 MG tablet; Take 1 tablet (100 mg total) by mouth 2 (two) times daily.  Dispense: 20 tablet; Refill: 0  2. Acute bronchitis, bacterial - CBC with Differential/Platelet - BMP8+EGFR - DG Chest 2 View; Future - doxycycline (VIBRA-TABS) 100 MG tablet; Take 1 tablet (100 mg total) by mouth 2 (two) times daily.  Dispense: 20 tablet; Refill: 0  3. SOB (shortness of breath) - CBC with Differential/Platelet - BMP8+EGFR - Ambulatory referral to Pulmonology  COVID test scheduled for tomorrow Start doxycyline  SOB or fever worsens go to ED!!! Labs pending    I discussed the assessment and treatment plan with the patient. The patient was provided an opportunity to ask questions and all were answered. The patient agreed with the plan and demonstrated an understanding of the instructions.   The patient was advised to call back or seek an in-person evaluation if the symptoms worsen or if the condition fails to improve as anticipated.  The above assessment and management plan was discussed with the patient. The patient verbalized understanding of and has agreed to the management plan. Patient is aware to call the clinic if symptoms persist or worsen. Patient is aware when to return to the clinic for a follow-up visit. Patient educated on when it is appropriate to go to the emergency department.   Time call ended:  9:21 AM  I provided 26 minutes of  non-face-to-face time during this encounter.    Evelina Dun, FNP

## 2019-08-04 NOTE — ED Notes (Signed)
Pt placed on purewick 

## 2019-08-04 NOTE — ED Notes (Signed)
Pt ambulated in room did about 10 laps. Pt 02 was 95 at highest and hr was 82 at highest. Pt drop down to 92 o2 and had some SOB. Pt back in bed and family at bedside.

## 2019-08-04 NOTE — ED Provider Notes (Signed)
[facility] EMERGENCY DEPARTMENT Provider Note   CSN: [id number] Arrival date & time: 08/04/19  1423   History Fever, AMS  Kristi Willis is a 78 y.o. female with past medical history significant for diabetes, GERD, hypertension, SVT, neuropathy, dysphagia who presents for evaluation of shortness of breath and fever.  Daughter states patient has had productive cough, unsure of color.  Has had shortness of breath over the last few days.  Daughter states patient shortness of breath has been worsening.  She saw her PCP via telemedicine visit today and treated for bronchitis.  Patient has had 1 dose of prednisone as well as doxycycline.  Daughter states over the last 24 hours patient has become more for get full.  She does have history of dementia.  Daughter states patient has been warm however has not taken her temperature.  Daughter is also heard wheezing.  States she did have some lower extremity swelling 2 weeks ago and was started on Lasix however patient just picked up prescription yesterday.  She is only taken 1 dose.  She did have ultrasounds which were negative for DVT.  Has been using ipratropium inhalers at home without relief.  Does have history of asthma however is unsure if this is similar.  Denies headache, chest pain, abdominal pain, dysuria, rashes or lesions.  Patient's daughter states she has had generalized weakness, not wanting to get out of bed the last few days.  Her patient's daughters barely been able to get patient to eat or drink as well.  Patient's daughter does state that while she does have dementia as she is normally oriented enough to live on her own.  Level 5 caveat-Dementia.  Patient's daughter in room provides majority of history.  HPI     Past Medical History:  Diagnosis Date  . Allergy   . Asthma   . Cancer ([place])    skin  . Diabetes mellitus   . GERD (gastroesophageal reflux disease)    hiatal hernia  . Hypercholesteremia   .  Hypertension   . Neuromuscular disorder (HCC)    DM neuropathy  . Neuropathy   . SVT (supraventricular tachycardia) [facility])     Patient Active Problem List   Diagnosis Date Noted  . Pneumonia 08/04/2019  . Hypothyroidism 02/12/2019  . Speech articulation disorder 09/01/2018  . Essential hypertension 10/03/2017  . GAD (generalized anxiety disorder) 07/03/2017  . Uncontrolled type 2 diabetes mellitus with diabetic neuropathy, without long-term current use of insulin ([place]) 09/27/2016  . Depression 03/29/2016  . Insomnia 07/18/2015  . Vitamin D deficiency 10/11/2013  . Right carotid bruit 03/02/2013  . Degenerative disc disease, lumbar 11/27/2012  . Asthma 11/13/2010  . Hyperlipidemia associated with type 2 diabetes mellitus ([place]) 11/13/2010  . Cancer of skin 11/13/2010  . Diabetic neuropathy ([place]) 11/13/2010  . Wide-complex tachycardia==Atrial Tachycardia 11/13/2010    Past Surgical History:  Procedure Laterality Date  . BREAST BIOPSY     left breast biopsy  . LUMBAR LAMINECTOMY  08/10  . SKIN CANCER EXCISION  2005  . SPINE SURGERY     lumbar laminectomy     OB History   No obstetric history on file.     Family History  Problem Relation Age of Onset  . Cancer Mother 20       cervical cancer  . Depression Mother   . Heart disease Father   . Stroke Father   . Cancer Father        lung cancer  .  Heart disease Sister 38       MI  . Hypertension Sister   . Cancer Sister 7       breast cancer  . Healthy Daughter     Social History   Tobacco Use  . Smoking status: Never Smoker  . Smokeless tobacco: Never Used  Substance Use Topics  . Alcohol use: No  . Drug use: No    Home Medications Prior to Admission medications   Medication Sig Start Date End Date Taking? Authorizing Provider  ACCU-CHEK AVIVA PLUS test strip CHECK BLOOD SUGAR UP TO 4 TIMES A DAY OR AS DIRECTED Patient taking differently: 1 each by Other route 4 (four) times daily.  10/28/16    Dettinger, Fransisca Kaufmann, MD  ACCU-CHEK SOFTCLIX LANCETS lancets CHECK BLOOD SUGAR UP TO 4 TIMES A DAY OR AS DIRECTED Patient taking differently: 1 each by Other route 4 (four) times daily.  10/28/16   Dettinger, Fransisca Kaufmann, MD  acyclovir ointment (ZOVIRAX) 5 % APPLY TO THE AFFECTED AREA AROUND MOUTH EVERY 3 HOURS AS DIRECTED UNTIL CLEAR Patient taking differently: Apply 1 application topically every 3 (three) hours.  05/31/19   Sharion Balloon, FNP  albuterol (VENTOLIN HFA) 108 (90 BASE) MCG/ACT inhaler Inhale 2 puffs into the lungs every 6 (six) hours as needed for wheezing. 04/14/15   Claretta Fraise, MD  amLODipine (NORVASC) 5 MG tablet TAKE 1 TABLET DAILY. Patient taking differently: Take 5 mg by mouth daily.  03/24/19   Sharion Balloon, FNP  aspirin 81 MG chewable tablet Chew 81 mg by mouth daily.    [provider]  augmented betamethasone dipropionate (DIPROLENE-AF) 0.05 % cream Apply 1 application topically 2 (two) times daily as needed.  02/13/19   [provider]  azelastine (ASTELIN) 0.1 % nasal spray Place 2 sprays into both nostrils 2 (two) times daily. 08/24/14   Lysbeth Penner, FNP  Blood Glucose Monitoring Suppl (ACCU-CHEK AVIVA PLUS) w/Device KIT CHECK BLOOD SUGAR UP TO 4 TIMES A DAY Patient taking differently: 1 Device by Other route 4 (four) times daily.  03/01/16   Claretta Fraise, MD  Calcium Carbonate-Vitamin D (CALTRATE 600+D) 600-400 MG-UNIT per tablet Take 1 tablet by mouth 2 (two) times daily.    [provider]  doxazosin (CARDURA) 8 MG tablet TAKE (1) TABLET DAILY FOR HIGH BLOOD PRESSURE. Patient taking differently: Take 8 mg by mouth daily.  06/29/19   Sharion Balloon, FNP  doxycycline (VIBRA-TABS) 100 MG tablet Take 1 tablet (100 mg total) by mouth 2 (two) times daily. 08/04/19   Hawks, Alyse Low A, FNP  EPIPEN 2-PAK 0.3 MG/0.3ML SOAJ injection Inject 0.3 mg into the muscle as needed for anaphylaxis. Reported on 01/24/2016 03/07/14   [provider]   escitalopram (LEXAPRO) 20 MG tablet Take 1 tablet (20 mg total) by mouth daily. 04/06/19   Sharion Balloon, FNP  furosemide (LASIX) 20 MG tablet Take 1 tablet (20 mg total) by mouth daily as needed for fluid (Weight gain of 3 lbs in a day or 5 lbs in a week). 08/02/19 10/31/19  Donato Heinz, MD  gabapentin (NEURONTIN) 100 MG capsule Take 1 capsule (100 mg total) 3 (three) times daily by mouth. 07/03/17   Hawks, Theador Hawthorne, FNP  ketoconazole (NIZORAL) 2 % cream APPLY TO THE AFFECTED AREA(S) DAILY AS DIRECTED Patient taking differently: Apply 1 application topically daily.  05/31/19   Sharion Balloon, FNP  levothyroxine (SYNTHROID) 75 MCG tablet Take 1 tablet (75  mcg total) by mouth daily before breakfast. 06/28/19   Nida, Marella Chimes, MD  lisinopril (ZESTRIL) 40 MG tablet TAKE 1 TABLET DAILY Patient taking differently: Take 40 mg by mouth daily.  06/28/19   Sharion Balloon, FNP  loratadine (CLARITIN) 10 MG tablet Take 1 tablet (10 mg total) by mouth daily. 08/24/14   Lysbeth Penner, FNP  metFORMIN (GLUCOPHAGE) 1000 MG tablet TAKE  (1)  TABLET TWICE A DAY WITH MEALS (BREAKFAST AND SUPPER) Patient taking differently: Take 1,000 mg by mouth 2 (two) times daily with a meal.  07/20/19   Hawks, Alyse Low A, FNP  metoprolol succinate (TOPROL-XL) 100 MG 24 hr tablet TAKE (1) TABLET TWICE A DAY. Patient taking differently: Take 100 mg by mouth 2 (two) times daily.  07/20/19   Evelina Dun A, FNP  montelukast (SINGULAIR) 10 MG tablet Take 1 tablet (10 mg total) by mouth at bedtime. 04/14/15   Claretta Fraise, MD  Multiple Vitamin (MULTIVITAMIN) tablet Take 1 tablet by mouth daily.      [provider]  Omega-3 Fatty Acids (FISH OIL) 1200 MG CAPS Take 1 capsule by mouth daily.     [provider]  permethrin (ELIMITE) 5 % cream Apply 1 application topically at bedtime. Apply form the chin down on days 1, 2, and 7 then wash upon waking up. 02/13/19   [provider]   predniSONE (STERAPRED UNI-PAK 21 TAB) 10 MG (21) TBPK tablet Use as directed 08/04/19   Evelina Dun A, FNP  Pyridoxine HCl (VITAMIN B-6 PO) Take 1 tablet by mouth 2 (two) times daily. Reported on 01/24/2016    [provider]  rosuvastatin (CRESTOR) 20 MG tablet Take 1 tablet (20 mg total) by mouth at bedtime. 06/24/19 06/23/20  Evelina Dun A, FNP  sitaGLIPtin (JANUVIA) 50 MG tablet TAKE 1 TABLET DAILY Patient taking differently: Take 50 mg by mouth daily.  07/26/19   Cassandria Anger, MD  vitamin B-12 (CYANOCOBALAMIN) 500 MCG tablet Take 500 mcg by mouth daily.      [provider]  Vitamin D, Ergocalciferol, (DRISDOL) 1.25 MG (50000 UT) CAPS capsule TAKE 1 CAPSULE ONCE A WEEK Patient taking differently: Take 50,000 Units by mouth every 7 (seven) days.  09/08/18   Sharion Balloon, FNP    Allergies    Dimetapp c [phenylephrine-bromphen-codeine], Phenylephrine hcl, Ultram [tramadol hcl], Celebrex [celecoxib], Cortisone, Fenofibrate, and Sulfa antibiotics  Review of Systems   Review of Systems  Unable to perform ROS: Dementia  Constitutional: Positive for activity change, appetite change, chills, fatigue and fever.  HENT: Negative.   Eyes: Negative.   Respiratory: Positive for cough, shortness of breath and wheezing.   Cardiovascular: Positive for leg swelling. Negative for chest pain and palpitations.  Gastrointestinal: Negative.   Musculoskeletal: Negative.   Skin: Negative.   Neurological: Positive for weakness (Generalized). Negative for dizziness, tremors, syncope, facial asymmetry, speech difficulty, light-headedness, numbness and headaches.  All other systems reviewed and are negative.  Physical Exam Updated Vital Signs BP (!) 115/48   Pulse 64   Temp 99 F (37.2 C) (Oral)   Resp 15   Ht '5\' 5"'  (1.651 m)   Wt 72.6 kg   SpO2 96%   BMI 26.63 kg/m   Physical Exam Vitals and nursing note reviewed.  Constitutional:      General: She is not in acute  distress.    Appearance: She is ill-appearing. She is not toxic-appearing or diaphoretic.     Comments: Cachectic, ill-appearing.  Sleeping however arousal to voice.  HENT:     Head: Normocephalic and atraumatic. No raccoon eyes, Battle's sign, right periorbital erythema or left periorbital erythema.     Jaw: There is normal jaw occlusion.     Comments: 22mbilateral pupil, PERLA    Right Ear: Tympanic membrane, ear canal and external ear normal. There is no impacted cerumen. No hemotympanum. Tympanic membrane is not injected, scarred, perforated, erythematous, retracted or bulging.     Left Ear: Tympanic membrane, ear canal and external ear normal. There is no impacted cerumen. No hemotympanum. Tympanic membrane is not injected, scarred, perforated, erythematous, retracted or bulging.     Ears:     Comments: No Mastoid tenderness.    Nose: Nose normal.     Comments: No rhinorrhea and congestion to bilateral nares.  No sinus tenderness.    Mouth/Throat:     Mouth: Mucous membranes are dry.     Comments: Posterior oropharynx clear.    Tonsils without erythema or exudate.  Uvula midline without deviation. No drooling, dysphasia or trismus.  Phonation normal. Tongue midline Eyes:     Extraocular Movements: Extraocular movements intact.     Comments: No nystagmus  Neck:     Trachea: Trachea and phonation normal.     Meningeal: Brudzinski's sign and Kernig's sign absent.     Comments: No Neck stiffness or neck rigidity.  No meningismus.  No cervical lymphadenopathy. Cardiovascular:     Rate and Rhythm: Normal rate.     Pulses: Normal pulses.          Radial pulses are 2+ on the right side and 2+ on the left side.       Dorsalis pedis pulses are 2+ on the right side and 2+ on the left side.       Posterior tibial pulses are 2+ on the right side and 2+ on the left side.     Heart sounds: Normal heart sounds.     Comments: No murmurs rubs or gallops. Pulmonary:     Effort: Tachypnea present.      Breath sounds: Wheezing and rhonchi present.     Comments: Diffuse wheeze and rhonchi throughout. Mildly tachypnic at 22. Speech difficulty with dysphagia at baseline per daughter Abdominal:     General: Bowel sounds are normal.     Palpations: Abdomen is soft.     Tenderness: There is no abdominal tenderness.     Comments: Soft, nontender without rebound or guarding.  No CVA tenderness.  Musculoskeletal:     Cervical back: Full passive range of motion without pain and normal range of motion.     Comments: Moves all 4 extremities without difficulty.  Lower extremities without edema, erythema or warmth. Trace bilateral pitting edema to mid shin.  Skin:    Comments: Pallor. Tactile temperature in extremities. No rashes or lesions  Neurological:     Mental Status: She is easily aroused. She is confused.     Cranial Nerves: No facial asymmetry.     Comments: Dysphagia at baseline per daughter. No facial droop. Tongue midline, PERLA, equal shoulder, eye brow raise bilaterally. 4+/5 strength to BLE and BLL extremities. Negative finger to nose, [place]. Unable to perform heel to shin. Follows small commnds. Oriented to person, however not place, year, president. Knows daughter in room.    ED Results / Procedures / Treatments   Labs (all labs ordered are listed, but only abnormal results are displayed) Labs Reviewed  LACTIC ACID, PLASMA -  Abnormal; Notable for the following components:      Result Value   Lactic Acid, Venous 2.0 (*)    All other components within normal limits  CBC WITH DIFFERENTIAL/PLATELET - Abnormal; Notable for the following components:   RBC 3.42 (*)    Hemoglobin 9.6 (*)    HCT 29.3 (*)    Neutro Abs 7.9 (*)    Lymphs Abs 0.5 (*)    All other components within normal limits  COMPREHENSIVE METABOLIC PANEL - Abnormal; Notable for the following components:   Sodium 128 (*)    Chloride 94 (*)    Glucose, Bld 282 (*)    Calcium 8.5 (*)    Total Protein 6.4 (*)     Albumin 3.3 (*)    All other components within normal limits  D-DIMER, QUANTITATIVE (NOT AT [facility]) - Abnormal; Notable for the following components:   D-Dimer, Quant 1.19 (*)    All other components within normal limits  FIBRINOGEN - Abnormal; Notable for the following components:   Fibrinogen 633 (*)    All other components within normal limits  C-REACTIVE PROTEIN - Abnormal; Notable for the following components:   CRP 16.7 (*)    All other components within normal limits  BRAIN NATRIURETIC PEPTIDE - Abnormal; Notable for the following components:   B Natriuretic Peptide 291.2 (*)    All other components within normal limits  URINALYSIS, ROUTINE W REFLEX MICROSCOPIC - Abnormal; Notable for the following components:   Glucose, UA 150 (*)    Hgb urine dipstick SMALL (*)    Protein, ur 30 (*)    All other components within normal limits  MAGNESIUM - Abnormal; Notable for the following components:   Magnesium 1.6 (*)    All other components within normal limits  CBG MONITORING, ED - Abnormal; Notable for the following components:   Glucose-Capillary 234 (*)    All other components within normal limits  CULTURE, BLOOD (ROUTINE X 2)  CULTURE, BLOOD (ROUTINE X 2)  URINE CULTURE  SARS CORONAVIRUS 2 (TAT 6-24 HRS)  LACTIC ACID, PLASMA  PROCALCITONIN  LACTATE DEHYDROGENASE  FERRITIN  APTT  PROTIME-INR  TRIGLYCERIDES  IRON AND TIBC  FERRITIN  POC SARS CORONAVIRUS 2 AG -  ED  TROPONIN I (HIGH SENSITIVITY)  TROPONIN I (HIGH SENSITIVITY)    EKG None  Radiology DG Chest 2 View  Result Date: 08/04/2019 CLINICAL DATA:  Fever and shortness of breath. Aphasia. Cough for several days. EXAM: CHEST - 2 VIEW COMPARISON:  07/20/2019 FINDINGS: Stable enlarged cardiac silhouette. Interval patchy consolidation in the inferior aspect of the right upper lobe. Stable small amount of patchy and linear density at the left lung base. The previously demonstrated small bilateral pleural effusions are  not currently visualized with exception of possible minimal pleural fluid on the left. Thoracic spine degenerative changes. IMPRESSION: 1. Interval right upper lobe pneumonia. 2. Stable mild left basilar pneumonia or patchy atelectasis. 3. Resolved pleural fluid on the right and decreased pleural fluid on the left. Electronically Signed   By: Claudie Revering M.D.   On: 08/04/2019 14:45   CT Angio Chest PE W and/or Wo Contrast  Result Date: 08/04/2019 CLINICAL DATA:  Shortness of breath EXAM: CT ANGIOGRAPHY CHEST WITH CONTRAST TECHNIQUE: Multidetector CT imaging of the chest was performed using the standard protocol during bolus administration of intravenous contrast. Multiplanar CT image reconstructions and MIPs were obtained to evaluate the vascular anatomy. CONTRAST:  147m OMNIPAQUE IOHEXOL 350 MG/ML SOLN COMPARISON:  Chest  x-ray today FINDINGS: Cardiovascular: No filling defects in the pulmonary arteries to suggest pulmonary emboli. Heart is mildly enlarged. Aorta is normal caliber with scattered calcifications in the aorta and coronary arteries, best seen in the right coronary artery. Small pericardial effusion. Mediastinum/Nodes: Mildly enlarged right paratracheal lymph node measures up to 14 mm in short axis diameter. Borderline and mildly enlarged prevascular, right paratracheal and AP window lymph nodes. Lungs/Pleura: Small bilateral pleural effusions, right greater than left. Dense consolidation in the anterior right upper lobe. Ground-glass opacities also noted in the right lower lobe. Findings most compatible with pneumonia. No confluent opacity on the left. Upper Abdomen: Imaging into the upper abdomen shows no acute findings. Musculoskeletal: Chest wall soft tissues are unremarkable. No acute bony abnormality. Review of the MIP images confirms the above findings. IMPRESSION: Dense consolidation anteriorly in the right upper lobe. Ground-glass opacities in the right lower lobe. Findings most  compatible with pneumonia. Followup PA and lateral chest X-ray is recommended in 3-4 weeks following trial of antibiotic therapy to ensure resolution and exclude underlying malignancy. For Small bilateral pleural effusions, right greater than left. Mildly enlarged mediastinal lymph nodes, likely reactive. Cardiomegaly.  Small pericardial effusion. No evidence of pulmonary embolus. Electronically Signed   By: Rolm Baptise M.D.   On: 08/04/2019 19:33   DG Chest Port 1 View  Result Date: 08/04/2019 CLINICAL DATA:  Cough, fever, negative COVID-19, recent diagnosis of pneumonia EXAM: PORTABLE CHEST 1 VIEW COMPARISON:  Radiograph 08/04/2019 FINDINGS: Persistent patchy consolidation in the inferior right upper lobe better seen on PA and lateral radiograph earlier the same day. Suspect small left pleural effusion. No pneumothorax. Mild cardiomegaly is similar to prior. No acute osseous or soft tissue abnormality. Metallic foreign bodies project over the base of the right neck. IMPRESSION: 1. Persistent patchy consolidation in the inferior right upper lobe. 2. Suspect small left pleural effusion. 3. Stable cardiomegaly. 4. Metallic foreign bodies project over the base of the right neck, correlate with visual inspection. Electronically Signed   By: Lovena Le M.D.   On: 08/04/2019 16:43   Procedures .Critical Care Performed by: Nettie Elm, PA-C Authorized by: Nettie Elm, PA-C   Critical care provider statement:    Critical care time (minutes):  35   Critical care was necessary to treat or prevent imminent or life-threatening deterioration of the following conditions:  Sepsis   Critical care was time spent personally by me on the following activities:  Discussions with consultants, evaluation of patient's response to treatment, examination of patient, ordering and performing treatments and interventions, ordering and review of laboratory studies, ordering and review of radiographic studies,  pulse oximetry, re-evaluation of patient's condition, obtaining history from patient or surrogate and review of old charts   (including critical care time)  Medications Ordered in ED Medications  cefTRIAXone (ROCEPHIN) 2 g in sodium chloride 0.9 % 100 mL IVPB (0 g Intravenous Stopped 08/04/19 1800)  azithromycin (ZITHROMAX) 500 mg in sodium chloride 0.9 % 250 mL IVPB (0 mg Intravenous Stopped 08/04/19 1929)  acetaminophen (TYLENOL) tablet 650 mg (has no administration in time range)    Or  acetaminophen (TYLENOL) suppository 650 mg (has no administration in time range)  insulin aspart (novoLOG) injection 0-9 Units (has no administration in time range)  insulin aspart (novoLOG) injection 0-5 Units (has no administration in time range)  sodium chloride 0.9 % bolus 500 mL (0 mLs Intravenous Stopped 08/04/19 1830)  iohexol (OMNIPAQUE) 350 MG/ML injection 100 mL (100 mLs Intravenous  Contrast Given 08/04/19 1916)   ED Course  I have reviewed the triage vital signs and the nursing notes.  Pertinent labs & imaging results that were available during my care of the patient were reviewed by me and considered in my medical decision making (see chart for details).  78 year old presents for evaluation of fever shortness of breath.  She is febrile to 103.1 evaluation.  She appears ill however nonseptic.  History of dementia however is able to live on her own and function "normally."  Patient with increasing confusion over the last 24 hours per daughter in room.  No recent injury or trauma.  Daughter has been staying with patient due to her increasing weakness over the last 2 days.  She has no focal deficits on exam however does have dysphagia at baseline.  Telehealth visit with PCP today and started on doxycycline and prednisone.  Been using her ipratropium inhaler at home without relief.  No known Covid exposures.  Chest x-ray performed outpatient shows upper lobe pneumonia.  She does have some bilateral  lower extremity which started 2 weeks ago seen by cardiology had negative ultrasounds at that time for DVT.  Was started on Lasix however patient has only taken 1 dose.  Patient 92% on room air and mildly tachypneic. Apparently able to ambulate with O2 at 92% on RA however did become dyspneic.  Given nonfocal neuro exam with no recent injury or trauma, I have low suspicion for acute intracranial abnormality or meningitis as cause of patient's increased confusion from her baseline.  Likely from her pneumonia.  Labs and imaging personally reviewed: Dg chest with upper lobe pneumonia Urinalysis negative for infection, culture sent CBC without leukocytosis Delta trop neagtive BNP elevated. No pulmonary edema on CT chest Lactic acid 2.0, suspect dehydration COVID negative, will order PCR Ddimer elevated. Will order CTA chest  1930: Patient reevaluation. Discussed labs and imaging with family and patient in room.  CT without PE however does show large pneumonia.  Covid pending, will order PCR.  Patient defervesced with Tylenol in ED.  No hypoxia however given she is more confused from her baseline, dyspnea with exertion and weakness will admit to hospitalist service.  2000: CONSULT with Dr. Marlowe Sax with [facility]  who will evaluate patient for admission.  Did not give 30 cc/kg bolus given MAP greater than 65 and lactic acid at 2.0. ABX started in ED.  Patient seen eval by attending physician, Dr. Vanita Panda who agrees with the treatment, plan and disposition.  MDM Rules/Calculators/A&P                      Final Clinical Impression(s) / ED Diagnoses Final diagnoses:  Community acquired pneumonia of right lower lobe of lung  Weakness  Confusion  Hyponatremia  Hyperglycemia    Rx / DC Orders ED Discharge Orders    None       Azzie Thiem A, PA-C 08/04/19 2324    Carmin Muskrat, MD 08/05/19 0008

## 2019-08-05 ENCOUNTER — Inpatient Hospital Stay (HOSPITAL_COMMUNITY): Payer: Medicare Other

## 2019-08-05 ENCOUNTER — Other Ambulatory Visit: Payer: Self-pay

## 2019-08-05 ENCOUNTER — Other Ambulatory Visit: Payer: Medicare Other

## 2019-08-05 DIAGNOSIS — D509 Iron deficiency anemia, unspecified: Secondary | ICD-10-CM

## 2019-08-05 DIAGNOSIS — E119 Type 2 diabetes mellitus without complications: Secondary | ICD-10-CM

## 2019-08-05 DIAGNOSIS — D649 Anemia, unspecified: Secondary | ICD-10-CM

## 2019-08-05 DIAGNOSIS — R9431 Abnormal electrocardiogram [ECG] [EKG]: Secondary | ICD-10-CM

## 2019-08-05 DIAGNOSIS — G9341 Metabolic encephalopathy: Secondary | ICD-10-CM

## 2019-08-05 LAB — RESPIRATORY PANEL BY PCR

## 2019-08-05 LAB — INFLUENZA PANEL BY PCR (TYPE A & B)
Influenza A By PCR: NEGATIVE
Influenza B By PCR: NEGATIVE

## 2019-08-05 LAB — IRON AND TIBC
Iron: 16 ug/dL — ABNORMAL LOW (ref 28–170)
Saturation Ratios: 5 % — ABNORMAL LOW (ref 10.4–31.8)
TIBC: 332 ug/dL (ref 250–450)
UIBC: 316 ug/dL

## 2019-08-05 LAB — SARS CORONAVIRUS 2 (TAT 6-24 HRS): SARS Coronavirus 2: NEGATIVE

## 2019-08-05 LAB — FERRITIN: Ferritin: 154 ng/mL (ref 11–307)

## 2019-08-05 LAB — CBG MONITORING, ED: Glucose-Capillary: 181 mg/dL — ABNORMAL HIGH (ref 70–99)

## 2019-08-05 MED ORDER — FUROSEMIDE 10 MG/ML IJ SOLN
20.0000 mg | Freq: Every day | INTRAMUSCULAR | Status: DC
  Administered 2019-08-05 – 2019-08-07 (×3): 20 mg via INTRAVENOUS
  Filled 2019-08-05 (×3): qty 2

## 2019-08-05 MED ORDER — SODIUM CHLORIDE 0.9 % IV SOLN
2.0000 g | INTRAVENOUS | Status: DC
  Administered 2019-08-05 – 2019-08-07 (×3): 2 g via INTRAVENOUS
  Filled 2019-08-05: qty 20
  Filled 2019-08-05 (×2): qty 2

## 2019-08-05 MED ORDER — MAGNESIUM SULFATE 2 GM/50ML IV SOLN
2.0000 g | Freq: Once | INTRAVENOUS | Status: AC
  Administered 2019-08-05: 04:00:00 2 g via INTRAVENOUS
  Filled 2019-08-05: qty 50

## 2019-08-05 MED ORDER — GUAIFENESIN 100 MG/5ML PO SOLN
5.0000 mL | ORAL | Status: DC | PRN
  Administered 2019-08-06 (×2): 100 mg via ORAL
  Filled 2019-08-05 (×2): qty 5

## 2019-08-05 MED ORDER — ROSUVASTATIN CALCIUM 20 MG PO TABS
20.0000 mg | ORAL_TABLET | Freq: Every day | ORAL | Status: DC
  Administered 2019-08-05 – 2019-08-06 (×2): 20 mg via ORAL
  Filled 2019-08-05 (×2): qty 1

## 2019-08-05 MED ORDER — IPRATROPIUM-ALBUTEROL 0.5-2.5 (3) MG/3ML IN SOLN
3.0000 mL | Freq: Two times a day (BID) | RESPIRATORY_TRACT | Status: DC
  Administered 2019-08-06 – 2019-08-07 (×3): 3 mL via RESPIRATORY_TRACT
  Filled 2019-08-05 (×3): qty 3

## 2019-08-05 MED ORDER — METOPROLOL SUCCINATE ER 25 MG PO TB24
100.0000 mg | ORAL_TABLET | Freq: Two times a day (BID) | ORAL | Status: DC
  Administered 2019-08-05 – 2019-08-07 (×5): 100 mg via ORAL
  Filled 2019-08-05 (×6): qty 4

## 2019-08-05 MED ORDER — DOXAZOSIN MESYLATE 8 MG PO TABS
8.0000 mg | ORAL_TABLET | Freq: Every day | ORAL | Status: DC
  Administered 2019-08-05 – 2019-08-07 (×3): 8 mg via ORAL
  Filled 2019-08-05: qty 1
  Filled 2019-08-05 (×3): qty 4
  Filled 2019-08-05 (×2): qty 1

## 2019-08-05 MED ORDER — LEVOTHYROXINE SODIUM 75 MCG PO TABS
75.0000 ug | ORAL_TABLET | Freq: Every day | ORAL | Status: DC
  Administered 2019-08-06 – 2019-08-07 (×2): 75 ug via ORAL
  Filled 2019-08-05 (×2): qty 1

## 2019-08-05 MED ORDER — AMLODIPINE BESYLATE 5 MG PO TABS
5.0000 mg | ORAL_TABLET | Freq: Every day | ORAL | Status: DC
  Administered 2019-08-05 – 2019-08-07 (×3): 5 mg via ORAL
  Filled 2019-08-05 (×3): qty 1

## 2019-08-05 MED ORDER — ASPIRIN 81 MG PO CHEW
81.0000 mg | CHEWABLE_TABLET | Freq: Every day | ORAL | Status: DC
  Administered 2019-08-05 – 2019-08-07 (×3): 81 mg via ORAL
  Filled 2019-08-05 (×3): qty 1

## 2019-08-05 MED ORDER — LISINOPRIL 20 MG PO TABS
40.0000 mg | ORAL_TABLET | Freq: Every day | ORAL | Status: DC
  Administered 2019-08-05 – 2019-08-07 (×3): 40 mg via ORAL
  Filled 2019-08-05 (×3): qty 2

## 2019-08-05 MED ORDER — ENOXAPARIN SODIUM 40 MG/0.4ML ~~LOC~~ SOLN
40.0000 mg | Freq: Every day | SUBCUTANEOUS | Status: DC
  Administered 2019-08-05 – 2019-08-07 (×3): 40 mg via SUBCUTANEOUS
  Filled 2019-08-05 (×3): qty 0.4

## 2019-08-05 MED ORDER — MONTELUKAST SODIUM 10 MG PO TABS
10.0000 mg | ORAL_TABLET | Freq: Every day | ORAL | Status: DC
  Administered 2019-08-05 – 2019-08-06 (×2): 10 mg via ORAL
  Filled 2019-08-05 (×2): qty 1

## 2019-08-05 MED ORDER — IPRATROPIUM-ALBUTEROL 0.5-2.5 (3) MG/3ML IN SOLN
3.0000 mL | Freq: Four times a day (QID) | RESPIRATORY_TRACT | Status: DC
  Administered 2019-08-05 (×2): 3 mL via RESPIRATORY_TRACT
  Filled 2019-08-05 (×2): qty 3

## 2019-08-05 MED ORDER — IPRATROPIUM-ALBUTEROL 0.5-2.5 (3) MG/3ML IN SOLN
RESPIRATORY_TRACT | Status: AC
  Administered 2019-08-05: 10:00:00 3 mL via RESPIRATORY_TRACT
  Filled 2019-08-05: qty 3

## 2019-08-05 NOTE — Progress Notes (Addendum)
PROGRESS NOTE    Kristi Willis  QXI:[id number] DOB: 24-Feb-1941 DOA: 08/04/2019 PCP: Sharion Balloon, FNP    Brief Narrative:   Kristi Willis is a 78 y.o. female with medical history significant of asthma, type 2 diabetes, neuropathy, GERD, hypertension, hyperlipidemia, history of SVT presenting to the ED for evaluation of fever and shortness of breath.  Patient is currently very somnolent and no history could be obtained from her.  Daughter at bedside states patient has been diagnosed with aphasia and has difficulty speaking at baseline.  States she has never had a stroke but has been seen by a neurologist and diagnosed with aphasia.  States 2 weeks ago patient started having difficulty breathing which she thought was due to asthma.  She was seen by an allergy specialist who did not feel that asthma was causing her shortness of breath.  He referred her to cardiology.  Patient was seen by a cardiologist and told her shortness of breath was not related to her heart.  Daughter states patient's shortness of breath has been getting progressively worse for the past 2 weeks.  Today she had a fever of 100.4.  She has been very lethargic and more confused for the past few days.  This morning patient had a rapid Covid test done which was negative.  ED Course: SARS-CoV-2 rapid antigen test negative, PCR test pending.  Temperature 103.1 F. Oxygen saturation 92% on room air.  No leukocytosis.  Lactic acid 2.0>1.0.  UA not suggestive of infection.  Urine culture pending.  Blood culture x2 pending.  High-sensitivity troponin x2 negative.  Inflammatory markers elevated: D-dimer 1.19, fibrinogen 633, CRP 16.7.  Procalcitonin 0.12.  BNP 291. Chest x-ray showing patchy consolidation in the inferior right upper lobe and small left pleural effusion. CT angiogram showing dense consolidation anteriorly in the right upper lobe and groundglass opacities in the right lower lobe.  Small bilateral pleural  effusions, right greater than left.  Mildly enlarged mediastinal lymph nodes, likely reactive.  Cardiomegaly and small pericardial effusion.  No evidence of PE. Patient received ceftriaxone, azithromycin, and a 500 cc normal saline bolus.   Assessment & Plan:   Principal Problem:   Pneumonia Active Problems:   Acute metabolic encephalopathy   Type 2 diabetes mellitus (HCC)   Anemia   QT prolongation  Multifocal pneumonia, suspect bacterial etiology Patient presenting with progressive shortness of breath.  Patient on presentation with fever 103.1.  Lactic acid 2.0.  Elevated CRP.  WBC count 8.9.  BNP elevated 291.2.  Procalcitonin elevated 0.12.  Chest x-ray with right upper lobe pneumonia, stable mid left basilar pneumonia versus atelectasis.  Covid-19/SARS-CoV-2 PCR: Negative.  Rapid influenza negative.  Respiratory viral panel negative.  CT PA negative for pulmonary embolism but with dense consolidation right upper lobe with GGO right lower lobe compatible with pneumonia.  --Blood cultures x2: Pending --Received dose of azithromycin in ED, although w/ prolonged QTC, will discontinue --Continue ceftriaxone 2 g IV every 24 hours --RT consultation, duo nebs --Continue supplemental oxygen, titrate for SPO2 greater than 92% --Supportive care, Tylenol as needed  Acute metabolic encephalopathy Likely related to pneumonia.  Head CT negative for acute intracranial abnormality.  No focal neuro deficit. --IV antibiotics as above --Close monitoring of mental status  Mild lactic acidosis (resolved) Could possibly be due to dehydration.  Improved with a small fluid bolus. Lactic acid 2.0 >1.0.   Mild hyponatremia Sodium corrected for hyperglycemia, 131. --follow BMP daily  Pericardial and pleural effusions CT  showing small bilateral pleural effusions, right greater than left and a small pericardial effusion.  BNP mildly elevated at 291.  Echo done 07/29/2019 showing normal systolic  function.  TSH checked on 11/2 was normal.  Small bilateral pleural effusions are seen on prior chest x-ray from 12/1 as well.  Patient with 1+ bilateral pitting edema to mid shin. --Lasix 20 mg IV every 24 hours --Monitor strict I's and O's and daily weights  Non-insulin-dependent diabetes mellitus Hemoglobin A1c 7.0 on 11/2. --Sliding scale insulin sensitive with meals and CBG checks  Hypothyroidism: Continue levothyroxine 75 mcg p.o. daily  Essential hypertension --Continue metoprolol succinate 100 mg p.o. twice daily, lisinopril 40 mg p.o. daily, doxazosin 8 mg p.o. daily, amlodipine 5 mg p.o. daily --Continue aspirin and statin  Hyperlipidemia: Continue Crestor 20 mg p.o. daily  Hx asthma --RT for evaluation and treatment --Continue montelukast 10 mg p.o. daily --Duo nebs --Supplemental oxygen, maintain SPO2 greater than 92%  Normocytic anemia Hemoglobin 9.6, MCV 85.  Hemoglobin was 10.8 two weeks ago.  No signs of active bleeding.  Iron profile with iron 16, TIBC 332, ferritin 354. --Monitor CBC daily  QT prolongation on EKG Qtc on admission 501, repeat EKG this am 491 --Magnesium level low, 1.6, will replete --Potassium 3.8 --Continue to monitor on telemetry --Avoid QT prolonging medications such as azithromycin --Follow EKG daily   DVT prophylaxis: Lovenox Code Status: Full code Family Communication: Updated patient's daughter regarding plan of care at bedside Disposition Plan: Continue inpatient, IV antibiotics, further depending on clinical course   Consultants:   None  Procedures:   None  Antimicrobials:   Azithromycin 12/16 - 12/16  Ceftriaxone 12/16>>   Subjective: Patient seen and examined at bedside this morning, daughter present.  She has aphasia, daughters assist with interpretation.  Patient continues with cough, shortness of breath.  Also with fever, 103.1 on admission.  Now has become hypoxic this morning requiring supplemental oxygen.   No other specific complaints at this time.  Denies headache, no dizziness, no chest pain, no palpitations, no abdominal pain, no fatigue, no paresthesias.  No acute events overnight per nursing staff.  Objective: Vitals:   08/05/19 0800 08/05/19 0815 08/05/19 0915 08/05/19 0935  BP: (!) 159/75 (!) 162/62 (!) 157/58   Pulse: (!) 103 (!) 107 99 89  Resp:  (!) 27 (!) 22 (!) 24  Temp:      TempSrc:      SpO2: 94% 95% 97% 100%  Weight:      Height:        Intake/Output Summary (Last 24 hours) at 08/05/2019 1024 Last data filed at 08/05/2019 0523 Gross per 24 hour  Intake 699.67 ml  Output --  Net 699.67 ml   Filed Weights   08/04/19 1926  Weight: 72.6 kg    Examination:  General exam: Appears calm and comfortable, aphasic Respiratory system: Decreased breath sounds bilaterally, mild crackles right midlung, slight late expiratory wheezes bilaterally, normal respiratory effort, on 2 L nasal cannula Cardiovascular system: S1 & S2 heard, RRR. No JVD, murmurs, rubs, gallops or clicks.  1+ pitting pedal edema to mid shin Gastrointestinal system: Abdomen is nondistended, soft and nontender. No organomegaly or masses felt. Normal bowel sounds heard. Central nervous system: Alert and oriented.  Aphasic, no other focal neurological deficits. Extremities: Symmetric 5 x 5 power. Skin: Slight petechiae noted bilateral anterior shins, no wounds appreciated, no other rashes Psychiatry: Judgement and insight appear normal. Mood & affect appropriate.     Data  Reviewed: I have personally reviewed following labs and imaging studies  CBC: Recent Labs  Lab 08/04/19 1128 08/04/19 1650  WBC 9.6 8.9  NEUTROABS 7.6 7.9*  HGB 9.6* 9.6*  HCT 29.3* 29.3*  MCV 86.4 85.7  PLT 176 782   Basic Metabolic Panel: Recent Labs  Lab 08/04/19 1128 08/04/19 1650  NA 128* 128*  K 3.6 3.8  CL 90* 94*  CO2 25 22  GLUCOSE 278* 282*  BUN 12 11  CREATININE 0.69 0.65  CALCIUM 8.6* 8.5*  MG  --  1.6*    GFR: Estimated Creatinine Clearance: 57.8 mL/min (by C-G formula based on SCr of 0.65 mg/dL). Liver Function Tests: Recent Labs  Lab 08/04/19 1650  AST 20  ALT 15  ALKPHOS 43  BILITOT 1.1  PROT 6.4*  ALBUMIN 3.3*   No results for input(s): LIPASE, AMYLASE in the last 168 hours. No results for input(s): AMMONIA in the last 168 hours. Coagulation Profile: Recent Labs  Lab 08/04/19 1650  INR 1.2   Cardiac Enzymes: No results for input(s): CKTOTAL, CKMB, CKMBINDEX, TROPONINI in the last 168 hours. BNP (last 3 results) No results for input(s): PROBNP in the last 8760 hours. HbA1C: No results for input(s): HGBA1C in the last 72 hours. CBG: Recent Labs  Lab 08/04/19 2133 08/04/19 2328 08/05/19 0714  GLUCAP 234* 215* 181*   Lipid Profile: Recent Labs    08/04/19 1650  TRIG 57   Thyroid Function Tests: No results for input(s): TSH, T4TOTAL, FREET4, T3FREE, THYROIDAB in the last 72 hours. Anemia Panel: Recent Labs    08/04/19 1650 08/05/19 0349  FERRITIN 124 154  TIBC  --  332  IRON  --  16*   Sepsis Labs: Recent Labs  Lab 08/04/19 1650 08/04/19 2129  PROCALCITON 0.12  --   LATICACIDVEN 2.0* 1.0    Recent Results (from the past 240 hour(s))  SARS CORONAVIRUS 2 (TAT 6-24 HRS) Nasopharyngeal Nasopharyngeal Swab     Status: None   Collection Time: 08/04/19  9:21 PM   Specimen: Nasopharyngeal Swab  Result Value Ref Range Status   SARS Coronavirus 2 NEGATIVE NEGATIVE Final    Comment: (NOTE) SARS-CoV-2 target nucleic acids are NOT DETECTED. The SARS-CoV-2 RNA is generally detectable in upper and lower respiratory specimens during the acute phase of infection. Negative results do not preclude SARS-CoV-2 infection, do not rule out co-infections with other pathogens, and should not be used as the sole basis for treatment or other patient management decisions. Negative results must be combined with clinical observations, patient history, and epidemiological  information. The expected result is Negative. Fact Sheet for Patients: SugarRoll.be Fact Sheet for Healthcare Providers: https://www.woods-mathews.com/ This test is not yet approved or cleared by the [place] FDA and  has been authorized for detection and/or diagnosis of SARS-CoV-2 by FDA under an Emergency Use Authorization (EUA). This EUA will remain  in effect (meaning this test can be used) for the duration of the COVID-19 declaration under Section 56 4(b)(1) of the Act, 21 U.S.C. section 360bbb-3(b)(1), unless the authorization is terminated or revoked sooner. Performed at [facility], [place] [address] [place], [place] [address]   Respiratory Panel by PCR     Status: None   Collection Time: 08/05/19  3:29 AM   Specimen: Flu Kit Nasopharyngeal Swab; Respiratory  Result Value Ref Range Status   Adenovirus NOT DETECTED NOT DETECTED Final   Coronavirus 229E NOT DETECTED NOT DETECTED Final    Comment: (NOTE) The Coronavirus  on the Respiratory Panel, DOES NOT test for the novel  Coronavirus (2019 nCoV)    Coronavirus HKU1 NOT DETECTED NOT DETECTED Final   Coronavirus NL63 NOT DETECTED NOT DETECTED Final   Coronavirus OC43 NOT DETECTED NOT DETECTED Final   Metapneumovirus NOT DETECTED NOT DETECTED Final   Rhinovirus / Enterovirus NOT DETECTED NOT DETECTED Final   Influenza A NOT DETECTED NOT DETECTED Final   Influenza B NOT DETECTED NOT DETECTED Final   Parainfluenza Virus 1 NOT DETECTED NOT DETECTED Final   Parainfluenza Virus 2 NOT DETECTED NOT DETECTED Final   Parainfluenza Virus 3 NOT DETECTED NOT DETECTED Final   Parainfluenza Virus 4 NOT DETECTED NOT DETECTED Final   Respiratory Syncytial Virus NOT DETECTED NOT DETECTED Final   Bordetella pertussis NOT DETECTED NOT DETECTED Final   Chlamydophila pneumoniae NOT DETECTED NOT DETECTED Final   Mycoplasma pneumoniae NOT DETECTED NOT DETECTED Final    Comment: Performed at  [facility], [place] [address], [place], [place] [address]         Radiology Studies: DG Chest 2 View  Result Date: 08/04/2019 CLINICAL DATA:  Fever and shortness of breath. Aphasia. Cough for several days. EXAM: CHEST - 2 VIEW COMPARISON:  07/20/2019 FINDINGS: Stable enlarged cardiac silhouette. Interval patchy consolidation in the inferior aspect of the right upper lobe. Stable small amount of patchy and linear density at the left lung base. The previously demonstrated small bilateral pleural effusions are not currently visualized with exception of possible minimal pleural fluid on the left. Thoracic spine degenerative changes. IMPRESSION: 1. Interval right upper lobe pneumonia. 2. Stable mild left basilar pneumonia or patchy atelectasis. 3. Resolved pleural fluid on the right and decreased pleural fluid on the left. Electronically Signed   By: Claudie Revering M.D.   On: 08/04/2019 14:45   CT HEAD WO CONTRAST  Result Date: 08/05/2019 CLINICAL DATA:  Encephalopathy. EXAM: CT HEAD WITHOUT CONTRAST TECHNIQUE: Contiguous axial images were obtained from the base of the skull through the vertex without intravenous contrast. Patient received IV contrast for chest CT 6 hours prior. COMPARISON:  Brain MRI 04/17/2016 FINDINGS: Brain: No intracranial hemorrhage, mass effect, or midline shift. Brain volume is normal for age. No hydrocephalus. Incidental cavum septum pellucidum. The basilar cisterns are patent. No evidence of territorial infarct or acute ischemia. No extra-axial or intracranial fluid collection. Vascular: No hyperdense vessel. Slight residual intravascular contrast from prior chest CT. Skull: No fracture or focal lesion. Sinuses/Orbits: Minor mucosal thickening of left maxillary sinus. No sinus fluid level. Mastoid air cells are clear. Bilateral cataract resection. Other: None. IMPRESSION: No acute intracranial abnormality. Electronically Signed   By: Keith Rake M.D.   On: 08/05/2019  01:10   CT Angio Chest PE W and/or Wo Contrast  Result Date: 08/04/2019 CLINICAL DATA:  Shortness of breath EXAM: CT ANGIOGRAPHY CHEST WITH CONTRAST TECHNIQUE: Multidetector CT imaging of the chest was performed using the standard protocol during bolus administration of intravenous contrast. Multiplanar CT image reconstructions and MIPs were obtained to evaluate the vascular anatomy. CONTRAST:  127m OMNIPAQUE IOHEXOL 350 MG/ML SOLN COMPARISON:  Chest x-ray today FINDINGS: Cardiovascular: No filling defects in the pulmonary arteries to suggest pulmonary emboli. Heart is mildly enlarged. Aorta is normal caliber with scattered calcifications in the aorta and coronary arteries, best seen in the right coronary artery. Small pericardial effusion. Mediastinum/Nodes: Mildly enlarged right paratracheal lymph node measures up to 14 mm in short axis diameter. Borderline and mildly enlarged prevascular, right paratracheal and AP  window lymph nodes. Lungs/Pleura: Small bilateral pleural effusions, right greater than left. Dense consolidation in the anterior right upper lobe. Ground-glass opacities also noted in the right lower lobe. Findings most compatible with pneumonia. No confluent opacity on the left. Upper Abdomen: Imaging into the upper abdomen shows no acute findings. Musculoskeletal: Chest wall soft tissues are unremarkable. No acute bony abnormality. Review of the MIP images confirms the above findings. IMPRESSION: Dense consolidation anteriorly in the right upper lobe. Ground-glass opacities in the right lower lobe. Findings most compatible with pneumonia. Followup PA and lateral chest X-ray is recommended in 3-4 weeks following trial of antibiotic therapy to ensure resolution and exclude underlying malignancy. For Small bilateral pleural effusions, right greater than left. Mildly enlarged mediastinal lymph nodes, likely reactive. Cardiomegaly.  Small pericardial effusion. No evidence of pulmonary embolus.  Electronically Signed   By: Rolm Baptise M.D.   On: 08/04/2019 19:33   DG Chest Port 1 View  Result Date: 08/04/2019 CLINICAL DATA:  Cough, fever, negative COVID-19, recent diagnosis of pneumonia EXAM: PORTABLE CHEST 1 VIEW COMPARISON:  Radiograph 08/04/2019 FINDINGS: Persistent patchy consolidation in the inferior right upper lobe better seen on PA and lateral radiograph earlier the same day. Suspect small left pleural effusion. No pneumothorax. Mild cardiomegaly is similar to prior. No acute osseous or soft tissue abnormality. Metallic foreign bodies project over the base of the right neck. IMPRESSION: 1. Persistent patchy consolidation in the inferior right upper lobe. 2. Suspect small left pleural effusion. 3. Stable cardiomegaly. 4. Metallic foreign bodies project over the base of the right neck, correlate with visual inspection. Electronically Signed   By: Lovena Le M.D.   On: 08/04/2019 16:43        Scheduled Meds: . amLODipine  5 mg Oral Daily  . aspirin  81 mg Oral Daily  . doxazosin  8 mg Oral Daily  . enoxaparin (LOVENOX) injection  40 mg Subcutaneous Daily  . furosemide  20 mg Intravenous Daily  . insulin aspart  0-5 Units Subcutaneous QHS  . insulin aspart  0-9 Units Subcutaneous TID WC  . ipratropium-albuterol  3 mL Nebulization Q6H  . levothyroxine  75 mcg Oral QAC breakfast  . lisinopril  40 mg Oral Daily  . metoprolol succinate  100 mg Oral BID  . montelukast  10 mg Oral QHS  . rosuvastatin  20 mg Oral QHS   Continuous Infusions: . cefTRIAXone (ROCEPHIN)  IV       LOS: 1 day    Time spent: 39 minutes spent on chart review, discussion with nursing staff, consultants, updating family and interview/physical exam; more than 50% of that time was spent in counseling and/or coordination of care.    Gryffin Altice J [place] DO [facility] 08/05/2019, 10:24 AM

## 2019-08-05 NOTE — ED Notes (Signed)
O2 sats noted 99% on 2lmp [place].

## 2019-08-05 NOTE — Evaluation (Signed)
Physical Therapy Evaluation Patient Details Name: Kristi Willis MRN: [id number] DOB: 31-Oct-1940 Today's Date: 08/05/2019   History of Present Illness  Pt is a 78 y/o female admitted secondary to SOB and difficulty breathing. Pt found to have PNA. COVID (-). PMH including but not limited to asthma, DM and HTN.    Clinical Impression  Pt presented sitting upright in recliner chair, awake and willing to participate in therapy session. Pt's daughter present throughout session. Prior to admission, pt was independent with all functional mobility and ADLs. Pt lives alone in a single level home with a couple of steps to enter. She has family that live close by and her sister will be coming to stay with her for a week or two upon d/c. At the time of evaluation, pt performing transfers with min guard and ambulated a short distance within her room with min guard without use of an AD. Pt on RA throughout with SPO2 maintaining at 97%. Pt denying any difficulty breathing or SOB. Pt would continue to benefit from skilled physical therapy services at this time while admitted and after d/c to address the below listed limitations in order to improve overall safety and independence with functional mobility.     Follow Up Recommendations Home health PT    Equipment Recommendations  None recommended by PT    Recommendations for Other Services       Precautions / Restrictions Precautions Precautions: Fall Restrictions Weight Bearing Restrictions: No      Mobility  Bed Mobility               General bed mobility comments: pt OOB in recliner chair upon arrival  Transfers Overall transfer level: Needs assistance   Transfers: Sit to/from Stand Sit to Stand: Min guard         General transfer comment: for safety with transitional movement  Ambulation/Gait Ambulation/Gait assistance: Min guard Gait Distance (Feet): 25 Feet Assistive device: None;1 person hand held assist Gait  Pattern/deviations: Step-through pattern;Decreased step length - right;Decreased step length - left;Decreased stride length;Shuffle Gait velocity: decreased   General Gait Details: pt with slow, cautious, guarded gait with short step lengths bilaterally; no LOB or need for physical assistance, min guard for safety  Stairs            Wheelchair Mobility    Modified Rankin (Stroke Patients Only)       Balance Overall balance assessment: Needs assistance Sitting-balance support: Feet supported Sitting balance-Leahy Scale: Good     Standing balance support: No upper extremity supported Standing balance-Leahy Scale: Fair                               Pertinent Vitals/Pain Pain Assessment: No/denies pain    Home Living Family/patient expects to be discharged to:: Private residence Living Arrangements: Alone Available Help at Discharge: Family;Available 24 hours/day Type of Home: House Home Access: Stairs to enter Entrance Stairs-Rails: Psychiatric nurse of Steps: 2 Home Layout: One level Home Equipment: Cane - single point;Shower seat      Prior Function Level of Independence: Independent         Comments: drives     Hand Dominance        Extremity/Trunk Assessment   Upper Extremity Assessment Upper Extremity Assessment: Overall WFL for tasks assessed    Lower Extremity Assessment Lower Extremity Assessment: Generalized weakness       Communication   Communication: Receptive difficulties;Expressive  difficulties(baseline aphasia)  Cognition Arousal/Alertness: Awake/alert Behavior During Therapy: WFL for tasks assessed/performed Overall Cognitive Status: Difficult to assess                                 General Comments: WFL for general conversation; pt with expressive language difficulties but able to follow commands appropriately      General Comments      Exercises     Assessment/Plan    PT  Assessment Patient needs continued PT services  PT Problem List Decreased balance;Decreased mobility;Decreased coordination;Decreased knowledge of use of DME;Decreased safety awareness;Decreased knowledge of precautions       PT Treatment Interventions DME instruction;Gait training;Stair training;Functional mobility training;Therapeutic activities;Therapeutic exercise;Balance training;Neuromuscular re-education;Patient/family education    PT Goals (Current goals can be found in the Care Plan section)  Acute Rehab PT Goals Patient Stated Goal: to return home soon PT Goal Formulation: With patient/family Time For Goal Achievement: 08/19/19 Potential to Achieve Goals: Good    Frequency Min 3X/week   Barriers to discharge        Co-evaluation               AM-PAC PT "6 Clicks" Mobility  Outcome Measure Help needed turning from your back to your side while in a flat bed without using bedrails?: A Little Help needed moving from lying on your back to sitting on the side of a flat bed without using bedrails?: A Little Help needed moving to and from a bed to a chair (including a wheelchair)?: None Help needed standing up from a chair using your arms (e.g., wheelchair or bedside chair)?: None Help needed to walk in hospital room?: None Help needed climbing 3-5 steps with a railing? : A Little 6 Click Score: 21    End of Session   Activity Tolerance: Patient tolerated treatment well Patient left: in chair;with call bell/phone within reach;with family/visitor present Nurse Communication: Mobility status PT Visit Diagnosis: Other abnormalities of gait and mobility (R26.89);Muscle weakness (generalized) (M62.81)    Time: [id number] PT Time Calculation (min) (ACUTE ONLY): 30 min   Charges:   PT Evaluation $PT Eval Moderate Complexity: 1 Mod PT Treatments $Gait Training: 8-22 mins        Anastasio Champion, DPT  Acute Rehabilitation Services Pager [phone] Office  [place] 08/05/2019, 4:25 PM

## 2019-08-05 NOTE — ED Notes (Signed)
Patient transported to CT 

## 2019-08-05 NOTE — ED Notes (Signed)
Pt sitting on end of bed with family encouraging her to sit back in bed. Pts O2 sats noted to dropped in 80s on RA. Pt shaking and seems SOB. Pt assited back in bed and placed on 2LMP [place]. BS checked and noted to be 181. Pt seem to have positive response with O2 and calming down. Temp in room adjusted and pt now resting in bed with family.

## 2019-08-06 DIAGNOSIS — R4701 Aphasia: Secondary | ICD-10-CM

## 2019-08-06 LAB — GLUCOSE, CAPILLARY
Glucose-Capillary: 217 mg/dL — ABNORMAL HIGH (ref 70–99)
Glucose-Capillary: 302 mg/dL — ABNORMAL HIGH (ref 70–99)
Glucose-Capillary: 170 mg/dL — ABNORMAL HIGH (ref 70–99)
Glucose-Capillary: 181 mg/dL — ABNORMAL HIGH (ref 70–99)
Glucose-Capillary: 224 mg/dL — ABNORMAL HIGH (ref 70–99)
Glucose-Capillary: 179 mg/dL — ABNORMAL HIGH (ref 70–99)
Glucose-Capillary: 316 mg/dL — ABNORMAL HIGH (ref 70–99)

## 2019-08-06 LAB — URINE CULTURE

## 2019-08-06 LAB — BASIC METABOLIC PANEL
Anion gap: 11 (ref 5–15)
BUN: 14 mg/dL (ref 8–23)
CO2: 24 mmol/L (ref 22–32)
Calcium: 7.9 mg/dL — ABNORMAL LOW (ref 8.9–10.3)
Chloride: 94 mmol/L — ABNORMAL LOW (ref 98–111)
Creatinine, Ser: 0.64 mg/dL (ref 0.44–1.00)
GFR calc Af Amer: >60 mL/min (ref >60)
GFR calc non Af Amer: >60 mL/min (ref >60)
Glucose, Bld: 240 mg/dL — ABNORMAL HIGH (ref 70–99)
Potassium: 3.7 mmol/L (ref 3.5–5.1)
Sodium: 129 mmol/L — ABNORMAL LOW (ref 135–145)

## 2019-08-06 LAB — CBC
HCT: 28 % — ABNORMAL LOW (ref 36.0–46.0)
Hemoglobin: 9.1 g/dL — ABNORMAL LOW (ref 12.0–15.0)
MCH: 28 pg (ref 26.0–34.0)
MCHC: 32.5 g/dL (ref 30.0–36.0)
MCV: 86.2 fL (ref 80.0–100.0)
Platelets: 211 10*3/uL (ref 150–400)
RBC: 3.25 MIL/uL — ABNORMAL LOW (ref 3.87–5.11)
RDW: 13.4 % (ref 11.5–15.5)
WBC: 8 10*3/uL (ref 4.0–10.5)
nRBC: 0 % (ref 0.0–0.2)

## 2019-08-06 LAB — MAGNESIUM: Magnesium: 2 mg/dL (ref 1.7–2.4)

## 2019-08-06 LAB — PROCALCITONIN: Procalcitonin: 0.31 ng/mL

## 2019-08-06 MED ORDER — BENZONATATE 100 MG PO CAPS
100.0000 mg | ORAL_CAPSULE | Freq: Three times a day (TID) | ORAL | Status: DC | PRN
  Administered 2019-08-06 – 2019-08-07 (×2): 100 mg via ORAL
  Filled 2019-08-06 (×2): qty 1

## 2019-08-06 MED ORDER — INSULIN ASPART 100 UNIT/ML ~~LOC~~ SOLN
0.0000 [IU] | Freq: Three times a day (TID) | SUBCUTANEOUS | Status: DC
  Administered 2019-08-06: 5 [IU] via SUBCUTANEOUS
  Administered 2019-08-06: 3 [IU] via SUBCUTANEOUS
  Administered 2019-08-07: 8 [IU] via SUBCUTANEOUS
  Administered 2019-08-07: 3 [IU] via SUBCUTANEOUS

## 2019-08-06 MED ORDER — MENTHOL 3 MG MT LOZG
1.0000 | LOZENGE | OROMUCOSAL | Status: DC | PRN
  Administered 2019-08-06: 3 mg via ORAL
  Filled 2019-08-06: qty 9

## 2019-08-06 NOTE — Evaluation (Signed)
Clinical/Bedside Swallow Evaluation Patient Details  Name: Kristi Willis MRN: [id number] Date of Birth: 22-May-1941  Today's Date: 08/06/2019 Time: SLP Start Time (ACUTE ONLY): 1410 SLP Stop Time (ACUTE ONLY): 1430 SLP Time Calculation (min) (ACUTE ONLY): 20 min  Past Medical History:  Past Medical History:  Diagnosis Date  . Allergy   . Asthma   . Cancer ([place])    skin  . Diabetes mellitus   . GERD (gastroesophageal reflux disease)    hiatal hernia  . Hypercholesteremia   . Hypertension   . Neuromuscular disorder ([facility])    DM neuropathy  . Neuropathy   . SVT (supraventricular tachycardia) ([facility])    Past Surgical History:  Past Surgical History:  Procedure Laterality Date  . BREAST BIOPSY     left breast biopsy  . LUMBAR LAMINECTOMY  08/10  . SKIN CANCER EXCISION  2005  . SPINE SURGERY     lumbar laminectomy   HPI:  Kristi Willis is a 78 y.o. female with medical history significant of asthma, type 2 diabetes, neuropathy, GERD, hypertension, hyperlipidemia, history of SVT presenting to the ED for evaluation of fever and shortness of breath.   Daughter at bedside states patient has been diagnosed with aphasia and has difficulty speaking at baseline.  States she has never had a stroke but has been seen by a neurologist and diagnosed with aphasia.  States 2 weeks ago patient started having difficulty breathing Asthma and cardiac source have been ruled out.  Daughter states patient's shortness of breath has been getting progressively worse for the past 2 weeks.  Today she had a fever of 100.4.  She has been very lethargic and more confused for the past few days.  COVID -. CT PA negative for pulmonary embolism but with dense consolidation right upper lobe with GGO right lower lobe compatible with pneumonia. 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 Pt was d/c'd from therapy with home program.    Assessment / Plan  / Recommendation Clinical Impression  Pt demonstrates no signs of aspiration or dyspahgia. She denies any difficulty swallowing. Aphasia/apraxia have been present since 2016 and pt went though a course of OP SLP therapy last year. Acute w/u for speech/langauge is not warranted at this time. If the pt would like further f/u with an SLP she should return to OP SLP therapy, though she reports she is not really interested in any further therapy at this time. She did not want me to contact her daughter today as she is at a doctors appointment. Will sign off SLP Visit Diagnosis: Dysphagia, unspecified (R13.10)    Aspiration Risk  Mild aspiration risk    Diet Recommendation Regular;Thin liquid   Liquid Administration via: Cup;Straw Medication Administration: Whole meds with liquid Supervision: Patient able to self feed Postural Changes: Seated upright at 90 degrees    Other  Recommendations Oral Care Recommendations: Oral care BID   Follow up Recommendations None      Frequency and Duration            Prognosis        Swallow Study   General HPI: Kristi Willis is a 78 y.o. female with medical history significant of asthma, type 2 diabetes, neuropathy, GERD, hypertension, hyperlipidemia, history of SVT presenting to the ED for evaluation of fever and shortness of breath.   Daughter at bedside states patient has been diagnosed with aphasia and has difficulty  speaking at baseline.  States she has never had a stroke but has been seen by a neurologist and diagnosed with aphasia.  States 2 weeks ago patient started having difficulty breathing Asthma and cardiac source have been ruled out.  Daughter states patient's shortness of breath has been getting progressively worse for the past 2 weeks.  Today she had a fever of 100.4.  She has been very lethargic and more confused for the past few days.  COVID -. CT PA negative for pulmonary embolism but with dense consolidation right upper lobe with  GGO right lower lobe compatible with pneumonia. 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 Pt was d/c'd from therapy with home program.  Type of Study: Bedside Swallow Evaluation Previous Swallow Assessment: none Diet Prior to this Study: Regular;Thin liquids Temperature Spikes Noted: No Respiratory Status: Room air History of Recent Intubation: No Behavior/Cognition: Alert;Cooperative;Pleasant mood Oral Cavity Assessment: Within Functional Limits Oral Care Completed by SLP: No Oral Cavity - Dentition: Adequate natural dentition Vision: Functional for self-feeding Self-Feeding Abilities: Able to feed self Patient Positioning: Upright in chair Baseline Vocal Quality: Normal Volitional Cough: Strong Volitional Swallow: Able to elicit    Oral/Motor/Sensory Function Overall Oral Motor/Sensory Function: Within functional limits   Ice Chips     Thin Liquid Thin Liquid: Within functional limits Presentation: Cup;Straw;Self Fed    Nectar Thick Nectar Thick Liquid: Not tested   Honey Thick Honey Thick Liquid: Not tested   Puree Puree: Within functional limits   Solid     Solid: Within functional limits     Herbie [place], MA [place] Pager [phone] Office [phone]  Lynann Beaver 08/06/2019,2:34 PM

## 2019-08-06 NOTE — Care Management Important Message (Signed)
Important Message  Patient Details  Name: Kristi Willis MRN: [id number] Date of Birth: 11/29/1940   Medicare Important Message Given:  Yes     Orbie Pyo 08/06/2019, 2:54 PM

## 2019-08-06 NOTE — Progress Notes (Signed)
PROGRESS NOTE    [name]  UQJ:[id number] DOB: 1941/01/30 DOA: 08/04/2019 PCP: Sharion Balloon, FNP    Brief Narrative:   Kristi Willis is a 78 y.o. female with medical history significant of asthma, type 2 diabetes, neuropathy, GERD, hypertension, hyperlipidemia, history of SVT presenting to the ED for evaluation of fever and shortness of breath.  Patient is currently very somnolent and no history could be obtained from her.  Daughter at bedside states patient has been diagnosed with aphasia and has difficulty speaking at baseline.  States she has never had a stroke but has been seen by a neurologist and diagnosed with aphasia.  States 2 weeks ago patient started having difficulty breathing which she thought was due to asthma.  She was seen by an allergy specialist who did not feel that asthma was causing her shortness of breath.  He referred her to cardiology.  Patient was seen by a cardiologist and told her shortness of breath was not related to her heart.  Daughter states patient's shortness of breath has been getting progressively worse for the past 2 weeks.  Today she had a fever of 100.4.  She has been very lethargic and more confused for the past few days.  This morning patient had a rapid Covid test done which was negative.  ED Course: SARS-CoV-2 rapid antigen test negative, PCR test pending.  Temperature 103.1 F. Oxygen saturation 92% on room air.  No leukocytosis.  Lactic acid 2.0>1.0.  UA not suggestive of infection.  Urine culture pending.  Blood culture x2 pending.  High-sensitivity troponin x2 negative.  Inflammatory markers elevated: D-dimer 1.19, fibrinogen 633, CRP 16.7.  Procalcitonin 0.12.  BNP 291. Chest x-ray showing patchy consolidation in the inferior right upper lobe and small left pleural effusion. CT angiogram showing dense consolidation anteriorly in the right upper lobe and groundglass opacities in the right lower lobe.  Small bilateral pleural  effusions, right greater than left.  Mildly enlarged mediastinal lymph nodes, likely reactive.  Cardiomegaly and small pericardial effusion.  No evidence of PE. Patient received ceftriaxone, azithromycin, and a 500 cc normal saline bolus.   Assessment & Plan:   Principal Problem:   Pneumonia Active Problems:   Acute metabolic encephalopathy   Type 2 diabetes mellitus (HCC)   Anemia   QT prolongation  Multifocal pneumonia, suspect bacterial etiology Acute respiratory failure with hypoxia Patient presenting with progressive shortness of breath.  Patient on presentation with fever 103.1.  Lactic acid 2.0.  Elevated CRP.  WBC count 8.9.  BNP elevated 291.2.  Procalcitonin elevated 0.12.  Chest x-ray with right upper lobe pneumonia, stable mid left basilar pneumonia versus atelectasis.  Covid-19/SARS-CoV-2 PCR: Negative.  Rapid influenza negative.  Respiratory viral panel negative.  CT PA negative for pulmonary embolism but with dense consolidation right upper lobe with GGO right lower lobe compatible with pneumonia.  Review of EMR with recent TTE 07/29/2019 with EF 60-65%, borderline LVH, LA severely dilated, with IVC dilated in size. --WBC 8.9-->8.0 --PCT 0.12-->0.31 --TMAX p 24h 100.2, currently 99.0 --Blood cultures x2: NG x <24h --Received dose of azithromycin in ED, although w/ prolonged QTC, will discontinue --Continue ceftriaxone 2 g IV every 24 hours --Furosemide 20 mg IV daily for lower extremity edema and crackles on exam --RT consultation, duo nebs BID --Continue supplemental oxygen, titrate for SPO2 greater than 92%; now titrated off as of AM 12/18 --Supportive care, Tylenol as needed  Hx Aphasia --Speech therapy evaluation pending   Acute metabolic encephalopathy  Likely related to pneumonia.  Head CT negative for acute intracranial abnormality.  No focal neuro deficit. --IV antibiotics as above --Close monitoring of mental status --Appears now back at normal  baseline.  Mild lactic acidosis (resolved) Could possibly be due to dehydration.  Improved with a small fluid bolus. Lactic acid 2.0 >1.0.   Mild hyponatremia Sodium corrected for hyperglycemia, 132. --follow BMP daily  Pericardial and pleural effusions CT showing small bilateral pleural effusions, right greater than left and a small pericardial effusion.  BNP mildly elevated at 291.  Echo done 07/29/2019 showing normal systolic function.  TSH checked on 11/2 was normal.  Small bilateral pleural effusions are seen on prior chest x-ray from 12/1 as well.  Patient with 1+ bilateral pitting edema to mid shin. --Lasix 20 mg IV every 24 hours --Monitor strict I's and O's and daily weights  Non-insulin-dependent diabetes mellitus Hemoglobin A1c 7.0 on 11/2. Glucose up to 240 this am --increase sliding scale insulin to moderate --CBG's qAC/HS  Hypothyroidism: Continue levothyroxine 75 mcg p.o. daily  Essential hypertension --Continue metoprolol succinate 100 mg p.o. twice daily, lisinopril 40 mg p.o. daily, doxazosin 8 mg p.o. daily, amlodipine 5 mg p.o. daily --Continue aspirin and statin  Hyperlipidemia: Continue Crestor 20 mg p.o. daily  Hx asthma --RT to follow --Continue montelukast 10 mg p.o. daily --Duo nebs --Supplemental oxygen, maintain SPO2 greater than 92%; now titrated off this morning --Per daughter, patient has been referred to outpatient pulmonology; awaiting initial visit  Normocytic anemia Hemoglobin 9.6, MCV 85.  Hemoglobin was 10.8 two weeks ago.  No signs of active bleeding.  Iron profile with iron 16, TIBC 332, ferritin 354. --Monitor CBC daily  Hypomagnesemia Repleted on admission.  Magnesium level 2.0 today. --Follow electrolytes daily with magnesium  QT prolongation on EKG Qtc on admission 501, repeat EKG this am 491 --Magnesium level low, 1.6, will replete --Potassium 3.8 --Continue to monitor on telemetry --Avoid QT prolonging medications such as  azithromycin --Follow EKG daily  Weakness/debility: Evaluated by physical therapy with recommendations of home health on discharge.   DVT prophylaxis: Lovenox Code Status: Full code Family Communication: No family present at bedside, will update daughter at bedside when she arrives at 10 AM. Disposition Plan: Continue inpatient, IV antibiotics, anticipate discharge with home health when medically ready   Consultants:   None  Procedures:   None  Antimicrobials:   Azithromycin 12/16 - 12/16  Ceftriaxone 12/16>>   Subjective: Patient seen and examined at bedside this morning, nursing present.  Sitting in bedside chair, about to eat breakfast.  States feels better.  Has been titrated off of supplemental oxygen.  No family present this morning. No other specific complaints at this time.  Denies headache, no dizziness, no chest pain, no palpitations, no abdominal pain, no fatigue, no paresthesias.  No acute events overnight per nursing staff.  Objective: Vitals:   08/05/19 2118 08/05/19 2304 08/06/19 0746 08/06/19 0811  BP: (!) 123/43 (!) 120/44  (!) 129/49  Pulse: 61 71  70  Resp:  16    Temp:  99 F (37.2 C)  99.1 F (37.3 C)  TempSrc:  Oral  Oral  SpO2:  96% 97% 98%  Weight:      Height:        Intake/Output Summary (Last 24 hours) at 08/06/2019 0909 Last data filed at 08/05/2019 2119 Gross per 24 hour  Intake 100 ml  Output 275 ml  Net -175 ml   Filed Weights   08/04/19 1926  Weight: 72.6 kg    Examination:  General exam: Appears calm and comfortable, aphasic Respiratory system: Decreased breath sounds bilaterally, mild crackles right midlung, slight late expiratory wheezes bilaterally, normal respiratory effort, on room air oxygenating 96% Cardiovascular system: S1 & S2 heard, RRR. No JVD, murmurs, rubs, gallops or clicks.  1+ pitting pedal edema to mid shin Gastrointestinal system: Abdomen is nondistended, soft and nontender. No organomegaly or masses  felt. Normal bowel sounds heard. Central nervous system: Alert and oriented.  Aphasic, no other focal neurological deficits. Extremities: Symmetric 5 x 5 power. Skin: Slight petechiae noted bilateral anterior shins, no wounds appreciated, no other rashes Psychiatry: Judgement and insight appear normal. Mood & affect appropriate.     Data Reviewed: I have personally reviewed following labs and imaging studies  CBC: Recent Labs  Lab 08/04/19 1128 08/04/19 1650 08/06/19 0237  WBC 9.6 8.9 8.0  NEUTROABS 7.6 7.9*  --   HGB 9.6* 9.6* 9.1*  HCT 29.3* 29.3* 28.0*  MCV 86.4 85.7 86.2  PLT 176 166 810   Basic Metabolic Panel: Recent Labs  Lab 08/04/19 1128 08/04/19 1650 08/06/19 0237  NA 128* 128* 129*  K 3.6 3.8 3.7  CL 90* 94* 94*  CO2 '25 22 24  ' GLUCOSE 278* 282* 240*  BUN '12 11 14  ' CREATININE 0.69 0.65 0.64  CALCIUM 8.6* 8.5* 7.9*  MG  --  1.6* 2.0   GFR: Estimated Creatinine Clearance: 57.8 mL/min (by C-G formula based on SCr of 0.64 mg/dL). Liver Function Tests: Recent Labs  Lab 08/04/19 1650  AST 20  ALT 15  ALKPHOS 43  BILITOT 1.1  PROT 6.4*  ALBUMIN 3.3*   No results for input(s): LIPASE, AMYLASE in the last 168 hours. No results for input(s): AMMONIA in the last 168 hours. Coagulation Profile: Recent Labs  Lab 08/04/19 1650  INR 1.2   Cardiac Enzymes: No results for input(s): CKTOTAL, CKMB, CKMBINDEX, TROPONINI in the last 168 hours. BNP (last 3 results) No results for input(s): PROBNP in the last 8760 hours. HbA1C: No results for input(s): HGBA1C in the last 72 hours. CBG: Recent Labs  Lab 08/04/19 2328 08/05/19 0714 08/05/19 1207 08/05/19 1714 08/05/19 2109  GLUCAP 215* 181* 217* 302* 170*   Lipid Profile: Recent Labs    08/04/19 1650  TRIG 57   Thyroid Function Tests: No results for input(s): TSH, T4TOTAL, FREET4, T3FREE, THYROIDAB in the last 72 hours. Anemia Panel: Recent Labs    08/04/19 1650 08/05/19 0349  FERRITIN 124 154   TIBC  --  332  IRON  --  16*   Sepsis Labs: Recent Labs  Lab 08/04/19 1650 08/04/19 2129 08/06/19 0237  PROCALCITON 0.12  --  0.31  LATICACIDVEN 2.0* 1.0  --     Recent Results (from the past 240 hour(s))  Urine culture     Status: Abnormal   Collection Time: 08/04/19  4:26 PM   Specimen: In/Out Cath Urine  Result Value Ref Range Status   Specimen Description IN/OUT CATH URINE  Final   Special Requests   Final    NONE Performed at [facility], [place] [address], [place], [place] [address]    Culture MULTIPLE SPECIES PRESENT, SUGGEST RECOLLECTION (A)  Final   Report Status 08/06/2019 FINAL  Final  Blood Culture (routine x 2)     Status: None (Preliminary result)   Collection Time: 08/04/19  4:50 PM   Specimen: BLOOD  Result Value Ref Range Status   Specimen Description  BLOOD RIGHT ANTECUBITAL  Final   Special Requests   Final    BOTTLES DRAWN AEROBIC AND ANAEROBIC Blood Culture adequate volume   Culture   Final    NO GROWTH < 24 HOURS Performed at [facility], [address] [place], [place] [address]    Report Status PENDING  Incomplete  Blood Culture (routine x 2)     Status: None (Preliminary result)   Collection Time: 08/04/19  5:31 PM   Specimen: BLOOD RIGHT ARM  Result Value Ref Range Status   Specimen Description BLOOD RIGHT ARM  Final   Special Requests   Final    BOTTLES DRAWN AEROBIC AND ANAEROBIC Blood Culture results may not be optimal due to an inadequate volume of blood received in culture bottles   Culture   Final    NO GROWTH < 24 HOURS Performed at [facility], [place] [address], [place], [place] [address]    Report Status PENDING  Incomplete  SARS CORONAVIRUS 2 (TAT 6-24 HRS) Nasopharyngeal Nasopharyngeal Swab     Status: None   Collection Time: 08/04/19  9:21 PM   Specimen: Nasopharyngeal Swab  Result Value Ref Range Status   SARS Coronavirus 2 NEGATIVE NEGATIVE Final    Comment: (NOTE) SARS-CoV-2 target nucleic acids are NOT  DETECTED. The SARS-CoV-2 RNA is generally detectable in upper and lower respiratory specimens during the acute phase of infection. Negative results do not preclude SARS-CoV-2 infection, do not rule out co-infections with other pathogens, and should not be used as the sole basis for treatment or other patient management decisions. Negative results must be combined with clinical observations, patient history, and epidemiological information. The expected result is Negative. Fact Sheet for Patients: SugarRoll.be Fact Sheet for Healthcare Providers: https://www.woods-mathews.com/ This test is not yet approved or cleared by the [place] FDA and  has been authorized for detection and/or diagnosis of SARS-CoV-2 by FDA under an Emergency Use Authorization (EUA). This EUA will remain  in effect (meaning this test can be used) for the duration of the COVID-19 declaration under Section 56 4(b)(1) of the Act, 21 U.S.C. section 360bbb-3(b)(1), unless the authorization is terminated or revoked sooner. Performed at [facility], [place] [address], [place], [place] [address]   Respiratory Panel by PCR     Status: None   Collection Time: 08/05/19  3:29 AM   Specimen: Flu Kit Nasopharyngeal Swab; Respiratory  Result Value Ref Range Status   Adenovirus NOT DETECTED NOT DETECTED Final   Coronavirus 229E NOT DETECTED NOT DETECTED Final    Comment: (NOTE) The Coronavirus on the Respiratory Panel, DOES NOT test for the novel  Coronavirus (2019 nCoV)    Coronavirus HKU1 NOT DETECTED NOT DETECTED Final   Coronavirus NL63 NOT DETECTED NOT DETECTED Final   Coronavirus OC43 NOT DETECTED NOT DETECTED Final   Metapneumovirus NOT DETECTED NOT DETECTED Final   Rhinovirus / Enterovirus NOT DETECTED NOT DETECTED Final   Influenza A NOT DETECTED NOT DETECTED Final   Influenza B NOT DETECTED NOT DETECTED Final   Parainfluenza Virus 1 NOT DETECTED NOT DETECTED Final    Parainfluenza Virus 2 NOT DETECTED NOT DETECTED Final   Parainfluenza Virus 3 NOT DETECTED NOT DETECTED Final   Parainfluenza Virus 4 NOT DETECTED NOT DETECTED Final   Respiratory Syncytial Virus NOT DETECTED NOT DETECTED Final   Bordetella pertussis NOT DETECTED NOT DETECTED Final   Chlamydophila pneumoniae NOT DETECTED NOT DETECTED Final   Mycoplasma pneumoniae NOT DETECTED NOT  DETECTED Final    Comment: Performed at [facility], [place] [address] [place], [place] [address]         Radiology Studies: DG Chest 2 View  Result Date: 08/04/2019 CLINICAL DATA:  Fever and shortness of breath. Aphasia. Cough for several days. EXAM: CHEST - 2 VIEW COMPARISON:  07/20/2019 FINDINGS: Stable enlarged cardiac silhouette. Interval patchy consolidation in the inferior aspect of the right upper lobe. Stable small amount of patchy and linear density at the left lung base. The previously demonstrated small bilateral pleural effusions are not currently visualized with exception of possible minimal pleural fluid on the left. Thoracic spine degenerative changes. IMPRESSION: 1. Interval right upper lobe pneumonia. 2. Stable mild left basilar pneumonia or patchy atelectasis. 3. Resolved pleural fluid on the right and decreased pleural fluid on the left. Electronically Signed   By: Claudie Revering M.D.   On: 08/04/2019 14:45   CT HEAD WO CONTRAST  Result Date: 08/05/2019 CLINICAL DATA:  Encephalopathy. EXAM: CT HEAD WITHOUT CONTRAST TECHNIQUE: Contiguous axial images were obtained from the base of the skull through the vertex without intravenous contrast. Patient received IV contrast for chest CT 6 hours prior. COMPARISON:  Brain MRI 04/17/2016 FINDINGS: Brain: No intracranial hemorrhage, mass effect, or midline shift. Brain volume is normal for age. No hydrocephalus. Incidental cavum septum pellucidum. The basilar cisterns are patent. No evidence of territorial infarct or acute ischemia. No extra-axial or  intracranial fluid collection. Vascular: No hyperdense vessel. Slight residual intravascular contrast from prior chest CT. Skull: No fracture or focal lesion. Sinuses/Orbits: Minor mucosal thickening of left maxillary sinus. No sinus fluid level. Mastoid air cells are clear. Bilateral cataract resection. Other: None. IMPRESSION: No acute intracranial abnormality. Electronically Signed   By: Keith Rake M.D.   On: 08/05/2019 01:10   CT Angio Chest PE W and/or Wo Contrast  Result Date: 08/04/2019 CLINICAL DATA:  Shortness of breath EXAM: CT ANGIOGRAPHY CHEST WITH CONTRAST TECHNIQUE: Multidetector CT imaging of the chest was performed using the standard protocol during bolus administration of intravenous contrast. Multiplanar CT image reconstructions and MIPs were obtained to evaluate the vascular anatomy. CONTRAST:  164m OMNIPAQUE IOHEXOL 350 MG/ML SOLN COMPARISON:  Chest x-ray today FINDINGS: Cardiovascular: No filling defects in the pulmonary arteries to suggest pulmonary emboli. Heart is mildly enlarged. Aorta is normal caliber with scattered calcifications in the aorta and coronary arteries, best seen in the right coronary artery. Small pericardial effusion. Mediastinum/Nodes: Mildly enlarged right paratracheal lymph node measures up to 14 mm in short axis diameter. Borderline and mildly enlarged prevascular, right paratracheal and AP window lymph nodes. Lungs/Pleura: Small bilateral pleural effusions, right greater than left. Dense consolidation in the anterior right upper lobe. Ground-glass opacities also noted in the right lower lobe. Findings most compatible with pneumonia. No confluent opacity on the left. Upper Abdomen: Imaging into the upper abdomen shows no acute findings. Musculoskeletal: Chest wall soft tissues are unremarkable. No acute bony abnormality. Review of the MIP images confirms the above findings. IMPRESSION: Dense consolidation anteriorly in the right upper lobe. Ground-glass  opacities in the right lower lobe. Findings most compatible with pneumonia. Followup PA and lateral chest X-ray is recommended in 3-4 weeks following trial of antibiotic therapy to ensure resolution and exclude underlying malignancy. For Small bilateral pleural effusions, right greater than left. Mildly enlarged mediastinal lymph nodes, likely reactive. Cardiomegaly.  Small pericardial effusion. No evidence of pulmonary embolus. Electronically Signed   By: KRolm BaptiseM.D.   On:  08/04/2019 19:33   DG Chest Port 1 View  Result Date: 08/04/2019 CLINICAL DATA:  Cough, fever, negative COVID-19, recent diagnosis of pneumonia EXAM: PORTABLE CHEST 1 VIEW COMPARISON:  Radiograph 08/04/2019 FINDINGS: Persistent patchy consolidation in the inferior right upper lobe better seen on PA and lateral radiograph earlier the same day. Suspect small left pleural effusion. No pneumothorax. Mild cardiomegaly is similar to prior. No acute osseous or soft tissue abnormality. Metallic foreign bodies project over the base of the right neck. IMPRESSION: 1. Persistent patchy consolidation in the inferior right upper lobe. 2. Suspect small left pleural effusion. 3. Stable cardiomegaly. 4. Metallic foreign bodies project over the base of the right neck, correlate with visual inspection. Electronically Signed   By: Lovena Le M.D.   On: 08/04/2019 16:43        Scheduled Meds: . amLODipine  5 mg Oral Daily  . aspirin  81 mg Oral Daily  . doxazosin  8 mg Oral Daily  . enoxaparin (LOVENOX) injection  40 mg Subcutaneous Daily  . furosemide  20 mg Intravenous Daily  . insulin aspart  0-5 Units Subcutaneous QHS  . insulin aspart  0-9 Units Subcutaneous TID WC  . ipratropium-albuterol  3 mL Nebulization BID  . levothyroxine  75 mcg Oral QAC breakfast  . lisinopril  40 mg Oral Daily  . metoprolol succinate  100 mg Oral BID  . montelukast  10 mg Oral QHS  . rosuvastatin  20 mg Oral QHS   Continuous Infusions: . cefTRIAXone  (ROCEPHIN)  IV 2 g (08/05/19 1759)     LOS: 2 days    Time spent: 34 minutes spent on chart review, discussion with nursing staff, consultants, updating family and interview/physical exam; more than 50% of that time was spent in counseling and/or coordination of care.    Mattheus Rauls J [place] DO [facility] 08/06/2019, 9:09 AM

## 2019-08-06 NOTE — Progress Notes (Signed)
Inpatient Diabetes Program Recommendations  AACE/ADA: New Consensus Statement on Inpatient Glycemic Control (2015)  Target Ranges:  Prepandial:   less than 140 mg/dL      Peak postprandial:   less than 180 mg/dL (1-2 hours)      Critically ill patients:  140 - 180 mg/dL   Lab Results  Component Value Date   GLUCAP 224 (H) 08/06/2019   HGBA1C 7.0 (H) 06/21/2019    Review of Glycemic Control Results for Kristi Willis, Kristi Willis (MRN [id number]) as of 08/06/2019 14:59  Ref. Range 08/05/2019 12:07 08/05/2019 17:14 08/05/2019 21:09 08/06/2019 08:10 08/06/2019 11:35  Glucose-Capillary Latest Ref Range: 70 - 99 mg/dL 217 (H) 302 (H) 170 (H) 181 (H) 224 (H)   Diabetes history: DM 2 Outpatient Diabetes medications:  Metformin 1000 mg bid, Januvia 50 mg daily Current orders for Inpatient glycemic control:  Novolog moderate tid with meals and HS  Inpatient Diabetes Program Recommendations:    Please consider adding Novolog meal coverage 3 units tid with meals (hold if patient eats less than 50%).   Thanks  Adah Perl, RN, BC-ADM Inpatient Diabetes Coordinator Pager [phone] (8a-5p)

## 2019-08-07 DIAGNOSIS — D508 Other iron deficiency anemias: Secondary | ICD-10-CM

## 2019-08-07 LAB — BASIC METABOLIC PANEL
Anion gap: 9 (ref 5–15)
BUN: 13 mg/dL (ref 8–23)
CO2: 26 mmol/L (ref 22–32)
Calcium: 8.6 mg/dL — ABNORMAL LOW (ref 8.9–10.3)
Chloride: 99 mmol/L (ref 98–111)
Creatinine, Ser: 0.61 mg/dL (ref 0.44–1.00)
GFR calc Af Amer: >60 mL/min (ref >60)
GFR calc non Af Amer: >60 mL/min (ref >60)
Glucose, Bld: 184 mg/dL — ABNORMAL HIGH (ref 70–99)
Potassium: 4.4 mmol/L (ref 3.5–5.1)
Sodium: 134 mmol/L — ABNORMAL LOW (ref 135–145)

## 2019-08-07 LAB — GLUCOSE, CAPILLARY
Glucose-Capillary: 188 mg/dL — ABNORMAL HIGH (ref 70–99)
Glucose-Capillary: 263 mg/dL — ABNORMAL HIGH (ref 70–99)

## 2019-08-07 LAB — CBC
HCT: 28.8 % — ABNORMAL LOW (ref 36.0–46.0)
Hemoglobin: 9.3 g/dL — ABNORMAL LOW (ref 12.0–15.0)
MCH: 27.8 pg (ref 26.0–34.0)
MCHC: 32.3 g/dL (ref 30.0–36.0)
MCV: 86.2 fL (ref 80.0–100.0)
Platelets: 246 10*3/uL (ref 150–400)
RBC: 3.34 MIL/uL — ABNORMAL LOW (ref 3.87–5.11)
RDW: 13.5 % (ref 11.5–15.5)
WBC: 6.8 10*3/uL (ref 4.0–10.5)
nRBC: 0 % (ref 0.0–0.2)

## 2019-08-07 LAB — PROCALCITONIN: Procalcitonin: 0.19 ng/mL

## 2019-08-07 LAB — MAGNESIUM: Magnesium: 2.2 mg/dL (ref 1.7–2.4)

## 2019-08-07 MED ORDER — CEPHALEXIN 500 MG PO CAPS: 500.0000 mg | ORAL_CAPSULE | Freq: Four times a day (QID) | ORAL | 0 refills | Status: DC

## 2019-08-07 MED ORDER — MENTHOL 3 MG MT LOZG: 1.0000 | LOZENGE | OROMUCOSAL | 12 refills | Status: DC | PRN

## 2019-08-07 MED ORDER — CEPHALEXIN 500 MG PO CAPS: 500.0000 mg | ORAL_CAPSULE | Freq: Four times a day (QID) | ORAL | 0 refills | Status: AC

## 2019-08-07 MED ORDER — ACETAMINOPHEN 325 MG PO TABS: 650.0000 mg | ORAL_TABLET | Freq: Four times a day (QID) | ORAL | 0 refills | Status: DC | PRN

## 2019-08-07 MED ORDER — IPRATROPIUM-ALBUTEROL 0.5-2.5 (3) MG/3ML IN SOLN: 3.0000 mL | Freq: Two times a day (BID) | RESPIRATORY_TRACT | 0 refills | Status: DC

## 2019-08-07 MED ORDER — BENZONATATE 100 MG PO CAPS: 100.0000 mg | ORAL_CAPSULE | Freq: Three times a day (TID) | ORAL | 0 refills | Status: DC | PRN

## 2019-08-07 NOTE — Care Management (Signed)
Ordered nebulizer to room.

## 2019-08-07 NOTE — Discharge Summary (Signed)
Discharge Summary  Kristi Willis KGU:[id number] DOB: 1941/04/24  PCP: Sharion Balloon, FNP  Admit date: 08/04/2019 Discharge date: 08/07/2019  Time spent: 31 minutes  Recommendations for Outpatient Follow-up:  1. Primary care provider 2. Patient discharged with home health  Discharge Diagnoses:  Active Hospital Problems   Diagnosis Date Noted  . Pneumonia 08/04/2019  . Acute metabolic encephalopathy 62/83/1517  . Type 2 diabetes mellitus ([place]) 08/05/2019  . Anemia 08/05/2019  . QT prolongation 08/05/2019    Resolved Hospital Problems  No resolved problems to display.    Discharge Condition: Improved  Diet recommendation: Cardiac  Vitals:   08/07/19 0732 08/07/19 0755  BP:  (!) 159/57  Pulse:  70  Resp:    Temp:  97.9 F (36.6 C)  SpO2: 97% 97%    History of present illness:   Kristi Willis a 78 y.o.femalewith medical history significant ofasthma, type 2 diabetes, neuropathy, GERD, hypertension, hyperlipidemia, history of SVT presenting to the ED for evaluation of fever and shortness of breath.Patient is currently very somnolent and no history could be obtained from her. Daughter at bedside states patient has been diagnosed with aphasia and has difficulty speaking at baseline. States she has never had a stroke but has been seen by aneurologist and diagnosed with aphasia. States 2 weeks ago patient started having difficulty breathing which she thought was due to asthma. She was seen by an allergy specialist who did not feel that asthma was causing her shortness of breath. He referred her to cardiology. Patient was seen by acardiologist and told her shortness of breath was not related to her heart. Daughter states patient's shortness of breath has been getting progressively worse for the past 2 weeks. Today she had a fever of 100.4.She has been very lethargic and more confused for the past few days. This morning patient had a rapid Covid test  done which was negative.  Hospital Course:  Principal Problem:   Pneumonia Active Problems:   Acute metabolic encephalopathy   Type 2 diabetes mellitus (HCC)   Anemia   QT prolongation 78 year old female admitted with pneumonia.  She has past medical history of asthma type 2 diabetes mellitus GERD.  She received Rocephin IV while she was in hospital is being converted to oral Keflex.  She will continue her doxycycline for a combination of those 2 medication to complete her oral treatment.  She has done well during this hospital stay she has been discharged with home health and nebulizer treatments.  Procedures:  None  Consultations:  None  Discharge Exam: BP (!) 159/57   Pulse 70   Temp 97.9 F (36.6 C) (Oral)   Resp 16   Ht '5\' 5"'  (1.651 m)   Wt 75.5 kg   SpO2 97%   BMI 27.69 kg/m   General: Alert oriented x3 in good spirit well-nourished no distress Cardiovascular: Regular rate and rhythm no murmur no edema Respiratory: Clear to auscultation bilaterally  Discharge Instructions You were cared for by a hospitalist during your hospital stay. If you have any questions about your discharge medications or the care you received while you were in the hospital after you are discharged, you can call the unit and asked to speak with the hospitalist on call if the hospitalist that took care of you is not available. Once you are discharged, your primary care physician will handle any further medical issues. Please note that NO REFILLS for any discharge medications will be authorized once you are discharged, as it  is imperative that you return to your primary care physician (or establish a relationship with a primary care physician if you do not have one) for your aftercare needs so that they can reassess your need for medications and monitor your lab values.  Discharge Instructions    Diet - low sodium heart healthy   Complete by: As directed    Discharge instructions   Complete by: As  directed    Complete antibiotics, follow-up with PCP in 1 week   Increase activity slowly   Complete by: As directed      Allergies as of 08/07/2019      Reactions   Dimetapp C [phenylephrine-bromphen-codeine] Itching   Phenylephrine Hcl Other (See Comments)   unknown   Ultram [tramadol Hcl] Other (See Comments)   Insomnia   Celebrex [celecoxib] Rash   Cortisone Rash   Fenofibrate Rash   Sulfa Antibiotics Rash      Medication List    STOP taking these medications   predniSONE 10 MG (21) Tbpk tablet Commonly known as: STERAPRED UNI-PAK 21 TAB     TAKE these medications   Accu-Chek Aviva Plus test strip Generic drug: glucose blood CHECK BLOOD SUGAR UP TO 4 TIMES A DAY OR AS DIRECTED What changed: See the new instructions.   Accu-Chek Aviva Plus w/Device Kit CHECK BLOOD SUGAR UP TO 4 TIMES A DAY What changed: See the new instructions.   Accu-Chek Softclix Lancets lancets CHECK BLOOD SUGAR UP TO 4 TIMES A DAY OR AS DIRECTED What changed: See the new instructions.   acetaminophen 325 MG tablet Commonly known as: TYLENOL Take 2 tablets (650 mg total) by mouth every 6 (six) hours as needed for mild pain or fever (or Fever >/= 101).   acyclovir ointment 5 % Commonly known as: ZOVIRAX APPLY TO THE AFFECTED AREA AROUND MOUTH EVERY 3 HOURS AS DIRECTED UNTIL CLEAR What changed: See the new instructions.   albuterol 108 (90 Base) MCG/ACT inhaler Commonly known as: Ventolin HFA Inhale 2 puffs into the lungs every 6 (six) hours as needed for wheezing.   amLODipine 5 MG tablet Commonly known as: NORVASC TAKE 1 TABLET DAILY.   aspirin 81 MG chewable tablet Chew 81 mg by mouth daily.   augmented betamethasone dipropionate 0.05 % cream Commonly known as: DIPROLENE-AF Apply 1 application topically 2 (two) times daily as needed.   azelastine 0.1 % nasal spray Commonly known as: ASTELIN Place 2 sprays into both nostrils 2 (two) times daily.   benzonatate 100 MG  capsule Commonly known as: TESSALON Take 1 capsule (100 mg total) by mouth 3 (three) times daily as needed for cough.   Caltrate 600+D 600-400 MG-UNIT tablet Generic drug: Calcium Carbonate-Vitamin D Take 1 tablet by mouth 2 (two) times daily.   cephALEXin 500 MG capsule Commonly known as: KEFLEX Take 1 capsule (500 mg total) by mouth 4 (four) times daily for 10 days.   doxazosin 8 MG tablet Commonly known as: CARDURA TAKE (1) TABLET DAILY FOR HIGH BLOOD PRESSURE. What changed: See the new instructions.   doxycycline 100 MG tablet Commonly known as: VIBRA-TABS Take 1 tablet (100 mg total) by mouth 2 (two) times daily.   EpiPen 2-Pak 0.3 mg/0.3 mL Soaj injection Generic drug: EPINEPHrine Inject 0.3 mg into the muscle as needed for anaphylaxis. Reported on 01/24/2016   escitalopram 20 MG tablet Commonly known as: LEXAPRO Take 1 tablet (20 mg total) by mouth daily.   Fish Oil 1200 MG Caps Take 1 capsule by  mouth daily.   furosemide 20 MG tablet Commonly known as: LASIX Take 1 tablet (20 mg total) by mouth daily as needed for fluid (Weight gain of 3 lbs in a day or 5 lbs in a week).   gabapentin 100 MG capsule Commonly known as: NEURONTIN Take 1 capsule (100 mg total) 3 (three) times daily by mouth.   ipratropium-albuterol 0.5-2.5 (3) MG/3ML Soln Commonly known as: DUONEB Take 3 mLs by nebulization 2 (two) times daily.   ketoconazole 2 % cream Commonly known as: NIZORAL APPLY TO THE AFFECTED AREA(S) DAILY AS DIRECTED What changed: See the new instructions.   levothyroxine 75 MCG tablet Commonly known as: SYNTHROID Take 1 tablet (75 mcg total) by mouth daily before breakfast.   lisinopril 40 MG tablet Commonly known as: ZESTRIL TAKE 1 TABLET DAILY   loratadine 10 MG tablet Commonly known as: CLARITIN Take 1 tablet (10 mg total) by mouth daily.   menthol-cetylpyridinium 3 MG lozenge Commonly known as: CEPACOL Take 1 lozenge (3 mg total) by mouth as needed for  sore throat.   metFORMIN 1000 MG tablet Commonly known as: GLUCOPHAGE TAKE  (1)  TABLET TWICE A DAY WITH MEALS (BREAKFAST AND SUPPER) What changed: See the new instructions.   metoprolol succinate 100 MG 24 hr tablet Commonly known as: TOPROL-XL TAKE (1) TABLET TWICE A DAY. What changed: See the new instructions.   montelukast 10 MG tablet Commonly known as: SINGULAIR Take 1 tablet (10 mg total) by mouth at bedtime.   multivitamin tablet Take 1 tablet by mouth daily.   permethrin 5 % cream Commonly known as: ELIMITE Apply 1 application topically at bedtime. Apply form the chin down on days 1, 2, and 7 then wash upon waking up.   rosuvastatin 20 MG tablet Commonly known as: Crestor Take 1 tablet (20 mg total) by mouth at bedtime.   sitaGLIPtin 50 MG tablet Commonly known as: Januvia TAKE 1 TABLET DAILY What changed:   how much to take  how to take this  when to take this  additional instructions   vitamin B-12 500 MCG tablet Commonly known as: CYANOCOBALAMIN Take 500 mcg by mouth daily.   VITAMIN B-6 PO Take 1 tablet by mouth 2 (two) times daily. Reported on 01/24/2016   Vitamin D (Ergocalciferol) 1.25 MG (50000 UT) Caps capsule Commonly known as: DRISDOL TAKE 1 CAPSULE ONCE A WEEK What changed: See the new instructions.   zinc gluconate 50 MG tablet Take 50 mg by mouth daily.            Durable Medical Equipment  (From admission, onward)         Start     Ordered   08/06/19 1401  For home use only DME Nebulizer machine  Once    Question Answer Comment  Patient needs a nebulizer to treat with the following condition COPD (chronic obstructive pulmonary disease) ([place])   Length of Need Lifetime      08/06/19 1402         Allergies  Allergen Reactions  . Dimetapp C [Phenylephrine-Bromphen-Codeine] Itching  . Phenylephrine Hcl Other (See Comments)    unknown  . Ultram [Tramadol Hcl] Other (See Comments)    Insomnia  . Celebrex [Celecoxib]  Rash  . Cortisone Rash  . Fenofibrate Rash  . Sulfa Antibiotics Rash      The results of significant diagnostics from this hospitalization (including imaging, microbiology, ancillary and laboratory) are listed below for reference.    Significant Diagnostic Studies:  DG Chest 2 View  Result Date: 08/04/2019 CLINICAL DATA:  Fever and shortness of breath. Aphasia. Cough for several days. EXAM: CHEST - 2 VIEW COMPARISON:  07/20/2019 FINDINGS: Stable enlarged cardiac silhouette. Interval patchy consolidation in the inferior aspect of the right upper lobe. Stable small amount of patchy and linear density at the left lung base. The previously demonstrated small bilateral pleural effusions are not currently visualized with exception of possible minimal pleural fluid on the left. Thoracic spine degenerative changes. IMPRESSION: 1. Interval right upper lobe pneumonia. 2. Stable mild left basilar pneumonia or patchy atelectasis. 3. Resolved pleural fluid on the right and decreased pleural fluid on the left. Electronically Signed   By: Claudie Revering M.D.   On: 08/04/2019 14:45   DG Chest 2 View  Result Date: 07/20/2019 CLINICAL DATA:  Chest pain EXAM: CHEST - 2 VIEW COMPARISON:  02/14/2012 chest radiograph. FINDINGS: Stable cardiomediastinal silhouette with mild cardiomegaly. No pneumothorax. Small bilateral pleural effusions. Hazy bibasilar opacities. No overt pulmonary edema. IMPRESSION: 1. Mild cardiomegaly without overt pulmonary edema. 2. Small bilateral pleural effusions with hazy bibasilar opacities, favor atelectasis. Electronically Signed   By: Ilona Sorrel M.D.   On: 07/20/2019 16:20   CT HEAD WO CONTRAST  Result Date: 08/05/2019 CLINICAL DATA:  Encephalopathy. EXAM: CT HEAD WITHOUT CONTRAST TECHNIQUE: Contiguous axial images were obtained from the base of the skull through the vertex without intravenous contrast. Patient received IV contrast for chest CT 6 hours prior. COMPARISON:  Brain MRI  04/17/2016 FINDINGS: Brain: No intracranial hemorrhage, mass effect, or midline shift. Brain volume is normal for age. No hydrocephalus. Incidental cavum septum pellucidum. The basilar cisterns are patent. No evidence of territorial infarct or acute ischemia. No extra-axial or intracranial fluid collection. Vascular: No hyperdense vessel. Slight residual intravascular contrast from prior chest CT. Skull: No fracture or focal lesion. Sinuses/Orbits: Minor mucosal thickening of left maxillary sinus. No sinus fluid level. Mastoid air cells are clear. Bilateral cataract resection. Other: None. IMPRESSION: No acute intracranial abnormality. Electronically Signed   By: Keith Rake M.D.   On: 08/05/2019 01:10   CT Angio Chest PE W and/or Wo Contrast  Result Date: 08/04/2019 CLINICAL DATA:  Shortness of breath EXAM: CT ANGIOGRAPHY CHEST WITH CONTRAST TECHNIQUE: Multidetector CT imaging of the chest was performed using the standard protocol during bolus administration of intravenous contrast. Multiplanar CT image reconstructions and MIPs were obtained to evaluate the vascular anatomy. CONTRAST:  127m OMNIPAQUE IOHEXOL 350 MG/ML SOLN COMPARISON:  Chest x-ray today FINDINGS: Cardiovascular: No filling defects in the pulmonary arteries to suggest pulmonary emboli. Heart is mildly enlarged. Aorta is normal caliber with scattered calcifications in the aorta and coronary arteries, best seen in the right coronary artery. Small pericardial effusion. Mediastinum/Nodes: Mildly enlarged right paratracheal lymph node measures up to 14 mm in short axis diameter. Borderline and mildly enlarged prevascular, right paratracheal and AP window lymph nodes. Lungs/Pleura: Small bilateral pleural effusions, right greater than left. Dense consolidation in the anterior right upper lobe. Ground-glass opacities also noted in the right lower lobe. Findings most compatible with pneumonia. No confluent opacity on the left. Upper Abdomen:  Imaging into the upper abdomen shows no acute findings. Musculoskeletal: Chest wall soft tissues are unremarkable. No acute bony abnormality. Review of the MIP images confirms the above findings. IMPRESSION: Dense consolidation anteriorly in the right upper lobe. Ground-glass opacities in the right lower lobe. Findings most compatible with pneumonia. Followup PA and lateral chest X-ray is recommended in 3-4 weeks following  trial of antibiotic therapy to ensure resolution and exclude underlying malignancy. For Small bilateral pleural effusions, right greater than left. Mildly enlarged mediastinal lymph nodes, likely reactive. Cardiomegaly.  Small pericardial effusion. No evidence of pulmonary embolus. Electronically Signed   By: Rolm Baptise M.D.   On: 08/04/2019 19:33   DG Chest Port 1 View  Result Date: 08/04/2019 CLINICAL DATA:  Cough, fever, negative COVID-19, recent diagnosis of pneumonia EXAM: PORTABLE CHEST 1 VIEW COMPARISON:  Radiograph 08/04/2019 FINDINGS: Persistent patchy consolidation in the inferior right upper lobe better seen on PA and lateral radiograph earlier the same day. Suspect small left pleural effusion. No pneumothorax. Mild cardiomegaly is similar to prior. No acute osseous or soft tissue abnormality. Metallic foreign bodies project over the base of the right neck. IMPRESSION: 1. Persistent patchy consolidation in the inferior right upper lobe. 2. Suspect small left pleural effusion. 3. Stable cardiomegaly. 4. Metallic foreign bodies project over the base of the right neck, correlate with visual inspection. Electronically Signed   By: Lovena Le M.D.   On: 08/04/2019 16:43   ECHOCARDIOGRAM COMPLETE  Result Date: 07/29/2019   ECHOCARDIOGRAM REPORT   Patient Name:   BESSY REANEY Date of Exam: 07/29/2019 Medical Rec #:  [id number]           Height:       63.0 in Accession #:    [id number]          Weight:       166.8 lb Date of Birth:  1941-07-25           BSA:          1.79 m  Patient Age:    60 years            BP:           165/64 mmHg Patient Gender: F                   HR:           60 bpm. Exam Location:  [facility] Procedure: 2D Echo, Cardiac Doppler and Color Doppler Indications:    R60.0 (ICD-10-CM) - Lower extremity edema                 R07.9 (ICD-10-CM) - Chest pain, unspecified type                 R07.2 (ICD-10-CM) - Precordial pain  History:        Patient has no prior history of Echocardiogram examinations.                 Risk Factors:Hypertension, Diabetes and Dyslipidemia. Cancer                 (HCC) (From Hx),SVT (supraventricular tachycardia) ([place]) (From                 Hx).  Sonographer:    Alvino Chapel RCS Referring Phys: [id number] Fisher  1. Left ventricular ejection fraction, by visual estimation, is 60 to 65%. The left ventricle has normal function. There is borderline left ventricular hypertrophy.  2. The left ventricle has no regional wall motion abnormalities.  3. Global right ventricle has normal systolic function.The right ventricular size is normal. No increase in right ventricular wall thickness.  4. Left atrial size was severely dilated.  5. Right atrial size was normal.  6. Mild mitral annular calcification.  7. The mitral valve is degenerative. Trivial mitral valve regurgitation.  8. The tricuspid valve is grossly normal. Tricuspid valve regurgitation is not demonstrated.  9. The aortic valve is tricuspid. Aortic valve regurgitation is not visualized. No evidence of aortic valve sclerosis or stenosis. 10. The pulmonic valve was grossly normal. Pulmonic valve regurgitation is not visualized. 11. The inferior vena cava is dilated in size with >50% respiratory variability, suggesting right atrial pressure of 8 mmHg. FINDINGS  Left Ventricle: Left ventricular ejection fraction, by visual estimation, is 60 to 65%. The left ventricle has normal function. The left ventricle has no regional wall motion abnormalities. The left  ventricular internal cavity size was the left ventricle is normal in size. There is borderline left ventricular hypertrophy. Concentric left ventricular hypertrophy. Left ventricular diastolic parameters were normal. Right Ventricle: The right ventricular size is normal. No increase in right ventricular wall thickness. Global RV systolic function is has normal systolic function. Left Atrium: Left atrial size was severely dilated. Right Atrium: Right atrial size was normal in size Pericardium: There is no evidence of pericardial effusion. Mitral Valve: The mitral valve is degenerative in appearance. There is mild thickening of the mitral valve leaflet(s). Mild mitral annular calcification. Trivial mitral valve regurgitation. Tricuspid Valve: The tricuspid valve is grossly normal. Tricuspid valve regurgitation is not demonstrated. Aortic Valve: The aortic valve is tricuspid. Aortic valve regurgitation is not visualized. The aortic valve is structurally normal, with no evidence of sclerosis or stenosis. Pulmonic Valve: The pulmonic valve was grossly normal. Pulmonic valve regurgitation is not visualized. Pulmonic regurgitation is not visualized. Aorta: The aortic root is normal in size and structure. Venous: The inferior vena cava is dilated in size with greater than 50% respiratory variability, suggesting right atrial pressure of 8 mmHg. IAS/Shunts: No atrial level shunt detected by color flow Doppler.  LEFT VENTRICLE PLAX 2D LVIDd:         5.44 cm       Diastology LVIDs:         3.01 cm       LV e' lateral:   9.90 cm/s LV PW:         1.02 cm       LV E/e' lateral: 14.0 LV IVS:        1.05 cm       LV e' medial:    9.46 cm/s LVOT diam:     1.80 cm       LV E/e' medial:  14.7 LV SV:         108 ml LV SV Index:   58.36 LVOT Area:     2.54 cm  LV Volumes (MOD) LV area d, A2C:    26.20 cm LV area d, A4C:    25.20 cm LV area s, A2C:    14.90 cm LV area s, A4C:    11.90 cm LV major d, A2C:   7.14 cm LV major d, A4C:    7.41 cm LV major s, A2C:   6.03 cm LV major s, A4C:   5.29 cm LV vol d, MOD A2C: 82.0 ml LV vol d, MOD A4C: 72.9 ml LV vol s, MOD A2C: 33.9 ml LV vol s, MOD A4C: 22.5 ml LV SV MOD A2C:     48.1 ml LV SV MOD A4C:     72.9 ml LV SV MOD BP:      49.3 ml RIGHT VENTRICLE RV S prime:     12.60 cm/s TAPSE (M-mode): 2.6 cm LEFT ATRIUM  Index       RIGHT ATRIUM           Index LA diam:        4.30 cm 2.40 cm/m  RA Area:     21.80 cm LA Vol (A2C):   93.0 ml 51.95 ml/m RA Volume:   62.30 ml  34.80 ml/m LA Vol (A4C):   78.6 ml 43.91 ml/m LA Biplane Vol: 86.3 ml 48.21 ml/m  AORTIC VALVE LVOT Vmax:   78.30 cm/s LVOT Vmean:  61.800 cm/s LVOT VTI:    0.221 m  AORTA Ao Root diam: 2.50 cm MITRAL VALVE MV Area (PHT): 3.99 cm              SHUNTS MV PHT:        55.10 msec            Systemic VTI:  0.22 m MV Decel Time: 190 msec              Systemic Diam: 1.80 cm MV E velocity: 139.00 cm/s 103 cm/s MV A velocity: 60.60 cm/s  70.3 cm/s MV E/A ratio:  2.29        1.5  Kate Sable MD Electronically signed by Kate Sable MD Signature Date/Time: 07/29/2019/10:39:37 AM    Final    VAS [place] LOWER EXTREMITY VENOUS (DVT)  Result Date: 07/28/2019  Lower Venous Study Indications: Swelling, and Patient's daughter states she has had bilateral leg swelling with chest pain and SOB since last Saturday.  Anticoagulation: None. Comparison Study: Prior left leg venous duplex exam on 02/05/2016 was negative                   for DVT. Performing Technologist: Salvadore Dom RVT, RDCS (AE), RDMS  Examination Guidelines: A complete evaluation includes B-mode imaging, spectral Doppler, color Doppler, and power Doppler as needed of all accessible portions of each vessel. Bilateral testing is considered an integral part of a complete examination. Limited examinations for reoccurring indications may be performed as noted.  +---------+---------------+---------+-----------+----------+--------------+ RIGHT     CompressibilityPhasicitySpontaneityPropertiesThrombus Aging +---------+---------------+---------+-----------+----------+--------------+ CFV      Full           Yes      Yes                                 +---------+---------------+---------+-----------+----------+--------------+ SFJ      Full           Yes      Yes                                 +---------+---------------+---------+-----------+----------+--------------+ FV Prox  Full           Yes      Yes                                 +---------+---------------+---------+-----------+----------+--------------+ FV Mid   Full           Yes      Yes                                 +---------+---------------+---------+-----------+----------+--------------+ FV DistalFull           Yes      Yes                                 +---------+---------------+---------+-----------+----------+--------------+  PFV      Full                                                        +---------+---------------+---------+-----------+----------+--------------+ POP      Full           Yes      Yes                                 +---------+---------------+---------+-----------+----------+--------------+ PTV      Full           Yes      Yes                                 +---------+---------------+---------+-----------+----------+--------------+ PERO     Full           Yes      Yes                                 +---------+---------------+---------+-----------+----------+--------------+ Gastroc  Full                                                        +---------+---------------+---------+-----------+----------+--------------+ GSV      Full           Yes      Yes                                 +---------+---------------+---------+-----------+----------+--------------+   +---------+---------------+---------+-----------+----------+--------------+ LEFT      CompressibilityPhasicitySpontaneityPropertiesThrombus Aging +---------+---------------+---------+-----------+----------+--------------+ CFV      Full           Yes      Yes                                 +---------+---------------+---------+-----------+----------+--------------+ SFJ      Full           Yes      Yes                                 +---------+---------------+---------+-----------+----------+--------------+ FV Prox  Full           Yes      Yes                                 +---------+---------------+---------+-----------+----------+--------------+ FV Mid   Full           Yes      Yes                                 +---------+---------------+---------+-----------+----------+--------------+ FV DistalFull           Yes  Yes                                 +---------+---------------+---------+-----------+----------+--------------+ PFV      Full                                                        +---------+---------------+---------+-----------+----------+--------------+ POP      Full           Yes      Yes                                 +---------+---------------+---------+-----------+----------+--------------+ PTV      Full           Yes      Yes                                 +---------+---------------+---------+-----------+----------+--------------+ PERO     Full           Yes      Yes                                 +---------+---------------+---------+-----------+----------+--------------+ Gastroc  Full                                                        +---------+---------------+---------+-----------+----------+--------------+ GSV      Full           Yes      Yes                                 +---------+---------------+---------+-----------+----------+--------------+    Findings reported to Dr. Newman Nickels email through [facility] at 4:10 pm.  Summary: Right: No evidence of deep vein thrombosis in the lower  extremity. No indirect evidence of obstruction proximal to the inguinal ligament. No cystic structure found in the popliteal fossa. Left: No evidence of deep vein thrombosis in the lower extremity. No indirect evidence of obstruction proximal to the inguinal ligament. No cystic structure found in the popliteal fossa.  *See table(s) above for measurements and observations. Electronically signed by Larae Grooms MD on 07/28/2019 at 10:01:13 PM.    Final     Microbiology: Recent Results (from the past 240 hour(s))  Urine culture     Status: Abnormal   Collection Time: 08/04/19  4:26 PM   Specimen: In/Out Cath Urine  Result Value Ref Range Status   Specimen Description IN/OUT CATH URINE  Final   Special Requests   Final    NONE Performed at [facility], [address], [place], [place] [address]    Culture MULTIPLE SPECIES PRESENT, SUGGEST RECOLLECTION (A)  Final   Report Status 08/06/2019 FINAL  Final  Blood Culture (routine x 2)     Status: None (Preliminary result)   Collection Time: 08/04/19  4:50 PM   Specimen:  BLOOD  Result Value Ref Range Status   Specimen Description BLOOD RIGHT ANTECUBITAL  Final   Special Requests   Final    BOTTLES DRAWN AEROBIC AND ANAEROBIC Blood Culture adequate volume   Culture   Final    NO GROWTH 3 DAYS Performed at [facility], [address], [place], [place] [address]    Report Status PENDING  Incomplete  Blood Culture (routine x 2)     Status: None (Preliminary result)   Collection Time: 08/04/19  5:31 PM   Specimen: BLOOD RIGHT ARM  Result Value Ref Range Status   Specimen Description BLOOD RIGHT ARM  Final   Special Requests   Final    BOTTLES DRAWN AEROBIC AND ANAEROBIC Blood Culture results may not be optimal due to an inadequate volume of blood received in culture bottles   Culture   Final    NO GROWTH 3 DAYS Performed at [facility], [place] [address] [place], [place] [address]    Report Status PENDING  Incomplete   SARS CORONAVIRUS 2 (TAT 6-24 HRS) Nasopharyngeal Nasopharyngeal Swab     Status: None   Collection Time: 08/04/19  9:21 PM   Specimen: Nasopharyngeal Swab  Result Value Ref Range Status   SARS Coronavirus 2 NEGATIVE NEGATIVE Final    Comment: (NOTE) SARS-CoV-2 target nucleic acids are NOT DETECTED. The SARS-CoV-2 RNA is generally detectable in upper and lower respiratory specimens during the acute phase of infection. Negative results do not preclude SARS-CoV-2 infection, do not rule out co-infections with other pathogens, and should not be used as the sole basis for treatment or other patient management decisions. Negative results must be combined with clinical observations, patient history, and epidemiological information. The expected result is Negative. Fact Sheet for Patients: SugarRoll.be Fact Sheet for Healthcare Providers: https://www.woods-mathews.com/ This test is not yet approved or cleared by the [place] FDA and  has been authorized for detection and/or diagnosis of SARS-CoV-2 by FDA under an Emergency Use Authorization (EUA). This EUA will remain  in effect (meaning this test can be used) for the duration of the COVID-19 declaration under Section 56 4(b)(1) of the Act, 21 U.S.C. section 360bbb-3(b)(1), unless the authorization is terminated or revoked sooner. Performed at [facility], [place] [address] [place], [place] [address]   Respiratory Panel by PCR     Status: None   Collection Time: 08/05/19  3:29 AM   Specimen: Flu Kit Nasopharyngeal Swab; Respiratory  Result Value Ref Range Status   Adenovirus NOT DETECTED NOT DETECTED Final   Coronavirus 229E NOT DETECTED NOT DETECTED Final    Comment: (NOTE) The Coronavirus on the Respiratory Panel, DOES NOT test for the novel  Coronavirus (2019 nCoV)    Coronavirus HKU1 NOT DETECTED NOT DETECTED Final   Coronavirus NL63 NOT DETECTED NOT DETECTED Final   Coronavirus  OC43 NOT DETECTED NOT DETECTED Final   Metapneumovirus NOT DETECTED NOT DETECTED Final   Rhinovirus / Enterovirus NOT DETECTED NOT DETECTED Final   Influenza A NOT DETECTED NOT DETECTED Final   Influenza B NOT DETECTED NOT DETECTED Final   Parainfluenza Virus 1 NOT DETECTED NOT DETECTED Final   Parainfluenza Virus 2 NOT DETECTED NOT DETECTED Final   Parainfluenza Virus 3 NOT DETECTED NOT DETECTED Final   Parainfluenza Virus 4 NOT DETECTED NOT DETECTED Final   Respiratory Syncytial Virus NOT DETECTED NOT DETECTED Final   Bordetella pertussis NOT DETECTED NOT DETECTED Final   Chlamydophila pneumoniae NOT DETECTED NOT  DETECTED Final   Mycoplasma pneumoniae NOT DETECTED NOT DETECTED Final    Comment: Performed at [facility], [place] [address] [place], [place] [address]     Labs: Basic Metabolic Panel: Recent Labs  Lab 08/04/19 1128 08/04/19 1650 08/06/19 0237 08/07/19 0310  NA 128* 128* 129* 134*  K 3.6 3.8 3.7 4.4  CL 90* 94* 94* 99  CO2 '25 22 24 26  ' GLUCOSE 278* 282* 240* 184*  BUN '12 11 14 13  ' CREATININE 0.69 0.65 0.64 0.61  CALCIUM 8.6* 8.5* 7.9* 8.6*  MG  --  1.6* 2.0 2.2   Liver Function Tests: Recent Labs  Lab 08/04/19 1650  AST 20  ALT 15  ALKPHOS 43  BILITOT 1.1  PROT 6.4*  ALBUMIN 3.3*   No results for input(s): LIPASE, AMYLASE in the last 168 hours. No results for input(s): AMMONIA in the last 168 hours. CBC: Recent Labs  Lab 08/04/19 1128 08/04/19 1650 08/06/19 0237 08/07/19 0310  WBC 9.6 8.9 8.0 6.8  NEUTROABS 7.6 7.9*  --   --   HGB 9.6* 9.6* 9.1* 9.3*  HCT 29.3* 29.3* 28.0* 28.8*  MCV 86.4 85.7 86.2 86.2  PLT 176 166 211 246   Cardiac Enzymes: No results for input(s): CKTOTAL, CKMB, CKMBINDEX, TROPONINI in the last 168 hours. BNP: BNP (last 3 results) Recent Labs    07/28/19 1253 08/04/19 1650  BNP 117.7* 291.2*    ProBNP (last 3 results) No results for input(s): PROBNP in the last 8760 hours.  CBG: Recent Labs  Lab  08/06/19 1135 08/06/19 1704 08/06/19 2106 08/07/19 0753 08/07/19 1147  GLUCAP 224* 179* 316* 188* 263*       Signed:  Cristal Deer, MD [facility] 08/07/2019, 3:39 PM

## 2019-08-07 NOTE — TOC Transition Note (Signed)
Transition of Care [facility]) - CM/SW Discharge Note   Patient Details  Name: Kristi Willis MRN: [id number] Date of Birth: 02/26/1941  Transition of Care [facility]) CM/SW Contact:  Carles Collet, RN Phone Number: 08/07/2019, 4:03 PM   Clinical Narrative:   Damaris Schooner w patient and daughter over the phone to set up home health services. No additional DME needs, nebulizer has been delivered to room. [facility] able to accept referral.  Provided them with daughter Jenny's number to contact.     Final next level of care: Home w Home Health Services Barriers to Discharge: No Barriers Identified   Patient Goals and CMS Choice        Discharge Placement                       Discharge Plan and Services                          HH Arranged: PT, RN [facility] Agency: [facility] Date Britt: 08/07/19 Time [place]: 1603 Representative spoke with at [place]: Cassie  Social Determinants of Health ([employer]) Interventions     Readmission Risk Interventions No flowsheet data found.

## 2019-08-09 LAB — CULTURE, BLOOD (ROUTINE X 2)
Culture: NO GROWTH
Culture: NO GROWTH
Special Requests: ADEQUATE

## 2019-08-11 ENCOUNTER — Telehealth: Payer: Self-pay | Admitting: *Deleted

## 2019-08-11 MED ORDER — ACCU-CHEK AVIVA PLUS VI STRP: ORAL_STRIP | 3 refills | Status: DC

## 2019-08-11 MED ORDER — ACCU-CHEK AVIVA PLUS W/DEVICE KIT: PACK | 0 refills | Status: DC

## 2019-08-11 MED ORDER — ACCU-CHEK SOFTCLIX LANCETS MISC: 3 refills | Status: DC

## 2019-08-11 NOTE — Telephone Encounter (Signed)
Aware new meter sent

## 2019-08-23 ENCOUNTER — Encounter: Payer: Self-pay | Admitting: Family

## 2019-08-23 ENCOUNTER — Ambulatory Visit (INDEPENDENT_AMBULATORY_CARE_PROVIDER_SITE_OTHER): Payer: Medicare PPO | Admitting: Family

## 2019-08-23 DIAGNOSIS — Z09 Encounter for follow-up examination after completed treatment for conditions other than malignant neoplasm: Secondary | ICD-10-CM | POA: Diagnosis not present

## 2019-08-23 DIAGNOSIS — J189 Pneumonia, unspecified organism: Secondary | ICD-10-CM

## 2019-08-23 MED ORDER — LEVOFLOXACIN 500 MG PO TABS: 500.0000 mg | ORAL_TABLET | Freq: Every day | ORAL | 0 refills | Status: DC

## 2019-08-23 NOTE — Progress Notes (Signed)
   Virtual Visit via telephone Note Due to COVID-19 pandemic this visit was conducted virtually. This visit type was conducted due to national recommendations for restrictions regarding the COVID-19 Pandemic (e.g. social distancing, sheltering in place) in an effort to limit this patient's exposure and mitigate transmission in our community. All issues noted in this document were discussed and addressed.  A physical exam was not performed with this format.  I connected with Kristi Willis daughter on 08/23/19 at 11:19 AM by telephone and verified that I am speaking with the correct person using two identifiers. Kristi Willis is currently located at work and no one  is currently with her  during visit. The provider, Evelina Dun, FNP is located in their office at time of visit.  I discussed the limitations, risks, security and privacy concerns of performing an evaluation and management service by telephone and the availability of in person appointments. I also discussed with the patient that there may be a patient responsible charge related to this service. The patient expressed understanding and agreed to proceed.   History and Present Illness:   HPI  Pt's daughter calls the office today for hospital follow up. She went to the ED on 08/05/19 and discharged on 08/07/19. She was diagnosed with Community acquired pneumonia, acute metabolic encephalopathy.   She was discharged on Keflex 500 mg QID. She has completed this medication. She denies any fever. She continues to have a productive white, thick sputum. Her SOB has improved greatly. 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   Review of Systems  Respiratory: Positive for cough, sputum production and shortness of breath.   All other systems reviewed and are negative.    Observations/Objective: Daughter did all the talking  Assessment and Plan: 1. Community acquired pneumonia of right upper lobe of lung - CBC with  Differential/Platelet - CMP14+EGFR - DG Chest 2 View; Future - levofloxacin (LEVAQUIN) 500 MG tablet; Take 1 tablet (500 mg total) by mouth daily.  Dispense: 7 tablet; Refill: 0  2. Hospital discharge follow-up - CBC with Differential/Platelet - CMP14+EGFR - DG Chest 2 View; Future  Given age and pneumonia, will treat with antibiotics  Labs and chest x-ray pending [facility] notes reviewed   I discussed the assessment and treatment plan with the patient. The patient was provided an opportunity to ask questions and all were answered. The patient agreed with the plan and demonstrated an understanding of the instructions.   The patient was advised to call back or seek an in-person evaluation if the symptoms worsen or if the condition fails to improve as anticipated.  The above assessment and management plan was discussed with the patient. The patient verbalized understanding of and has agreed to the management plan. Patient is aware to call the clinic if symptoms persist or worsen. Patient is aware when to return to the clinic for a follow-up visit. Patient educated on when it is appropriate to go to the emergency department.   Time call ended:  11:48 AM  I provided 29 minutes of non-face-to-face time during this encounter.    Evelina Dun, FNP

## 2019-08-24 ENCOUNTER — Ambulatory Visit (INDEPENDENT_AMBULATORY_CARE_PROVIDER_SITE_OTHER): Payer: Medicare PPO

## 2019-08-24 ENCOUNTER — Other Ambulatory Visit: Payer: Medicare PPO

## 2019-08-24 ENCOUNTER — Other Ambulatory Visit: Payer: Self-pay

## 2019-08-24 DIAGNOSIS — J90 Pleural effusion, not elsewhere classified: Secondary | ICD-10-CM | POA: Diagnosis not present

## 2019-08-24 DIAGNOSIS — J181 Lobar pneumonia, unspecified organism: Secondary | ICD-10-CM | POA: Diagnosis not present

## 2019-08-24 DIAGNOSIS — E1140 Type 2 diabetes mellitus with diabetic neuropathy, unspecified: Secondary | ICD-10-CM | POA: Diagnosis not present

## 2019-08-24 DIAGNOSIS — M6281 Muscle weakness (generalized): Secondary | ICD-10-CM

## 2019-08-24 DIAGNOSIS — Z09 Encounter for follow-up examination after completed treatment for conditions other than malignant neoplasm: Secondary | ICD-10-CM | POA: Diagnosis not present

## 2019-08-24 DIAGNOSIS — I10 Essential (primary) hypertension: Secondary | ICD-10-CM

## 2019-08-24 DIAGNOSIS — D649 Anemia, unspecified: Secondary | ICD-10-CM

## 2019-08-24 DIAGNOSIS — Z7984 Long term (current) use of oral hypoglycemic drugs: Secondary | ICD-10-CM

## 2019-08-24 DIAGNOSIS — G9341 Metabolic encephalopathy: Secondary | ICD-10-CM

## 2019-08-24 DIAGNOSIS — J189 Pneumonia, unspecified organism: Secondary | ICD-10-CM

## 2019-08-24 DIAGNOSIS — R4701 Aphasia: Secondary | ICD-10-CM

## 2019-08-24 DIAGNOSIS — I517 Cardiomegaly: Secondary | ICD-10-CM

## 2019-08-24 DIAGNOSIS — J440 Chronic obstructive pulmonary disease with acute lower respiratory infection: Secondary | ICD-10-CM | POA: Diagnosis not present

## 2019-08-25 DIAGNOSIS — Z7984 Long term (current) use of oral hypoglycemic drugs: Secondary | ICD-10-CM | POA: Diagnosis not present

## 2019-08-25 DIAGNOSIS — R4701 Aphasia: Secondary | ICD-10-CM | POA: Diagnosis not present

## 2019-08-25 DIAGNOSIS — J90 Pleural effusion, not elsewhere classified: Secondary | ICD-10-CM | POA: Diagnosis not present

## 2019-08-25 DIAGNOSIS — E1140 Type 2 diabetes mellitus with diabetic neuropathy, unspecified: Secondary | ICD-10-CM | POA: Diagnosis not present

## 2019-08-25 DIAGNOSIS — J440 Chronic obstructive pulmonary disease with acute lower respiratory infection: Secondary | ICD-10-CM | POA: Diagnosis not present

## 2019-08-25 DIAGNOSIS — M6281 Muscle weakness (generalized): Secondary | ICD-10-CM | POA: Diagnosis not present

## 2019-08-25 DIAGNOSIS — J189 Pneumonia, unspecified organism: Secondary | ICD-10-CM | POA: Diagnosis not present

## 2019-08-25 DIAGNOSIS — G9341 Metabolic encephalopathy: Secondary | ICD-10-CM | POA: Diagnosis not present

## 2019-08-25 LAB — CBC WITH DIFFERENTIAL/PLATELET
Basophils Absolute: 0 10*3/uL (ref 0.0–0.2)
Basos: 1 %
EOS (ABSOLUTE): 0.2 10*3/uL (ref 0.0–0.4)
Eos: 4 %
Hematocrit: 33.6 % — ABNORMAL LOW (ref 34.0–46.6)
Hemoglobin: 10.9 g/dL — ABNORMAL LOW (ref 11.1–15.9)
Immature Grans (Abs): 0 10*3/uL (ref 0.0–0.1)
Immature Granulocytes: 0 %
Lymphocytes Absolute: 2 10*3/uL (ref 0.7–3.1)
Lymphs: 30 %
MCH: 27.5 pg (ref 26.6–33.0)
MCHC: 32.4 g/dL (ref 31.5–35.7)
MCV: 85 fL (ref 79–97)
Monocytes Absolute: 0.7 10*3/uL (ref 0.1–0.9)
Monocytes: 10 %
Neutrophils Absolute: 3.7 10*3/uL (ref 1.4–7.0)
Neutrophils: 55 %
Platelets: 238 10*3/uL (ref 150–450)
RBC: 3.96 x10E6/uL (ref 3.77–5.28)
RDW: 13.3 % (ref 11.7–15.4)
WBC: 6.7 10*3/uL (ref 3.4–10.8)

## 2019-08-25 LAB — CMP14+EGFR
ALT: 16 IU/L (ref 0–32)
AST: 21 IU/L (ref 0–40)
Albumin/Globulin Ratio: 1.9 (ref 1.2–2.2)
Albumin: 4.2 g/dL (ref 3.7–4.7)
Alkaline Phosphatase: 55 IU/L (ref 39–117)
BUN/Creatinine Ratio: 14 (ref 12–28)
BUN: 8 mg/dL (ref 8–27)
Bilirubin Total: 0.5 mg/dL (ref 0.0–1.2)
CO2: 24 mmol/L (ref 20–29)
Calcium: 9.5 mg/dL (ref 8.7–10.3)
Chloride: 98 mmol/L (ref 96–106)
Creatinine, Ser: 0.59 mg/dL (ref 0.57–1.00)
GFR calc Af Amer: 102 mL/min/{1.73_m2} (ref >59)
GFR calc non Af Amer: 88 mL/min/{1.73_m2} (ref >59)
Globulin, Total: 2.2 g/dL (ref 1.5–4.5)
Glucose: 122 mg/dL — ABNORMAL HIGH (ref 65–99)
Potassium: 4.1 mmol/L (ref 3.5–5.2)
Sodium: 136 mmol/L (ref 134–144)
Total Protein: 6.4 g/dL (ref 6.0–8.5)

## 2019-08-26 DIAGNOSIS — J90 Pleural effusion, not elsewhere classified: Secondary | ICD-10-CM | POA: Diagnosis not present

## 2019-08-26 DIAGNOSIS — J440 Chronic obstructive pulmonary disease with acute lower respiratory infection: Secondary | ICD-10-CM | POA: Diagnosis not present

## 2019-08-26 DIAGNOSIS — E1140 Type 2 diabetes mellitus with diabetic neuropathy, unspecified: Secondary | ICD-10-CM | POA: Diagnosis not present

## 2019-08-26 DIAGNOSIS — M6281 Muscle weakness (generalized): Secondary | ICD-10-CM | POA: Diagnosis not present

## 2019-08-26 DIAGNOSIS — Z7984 Long term (current) use of oral hypoglycemic drugs: Secondary | ICD-10-CM | POA: Diagnosis not present

## 2019-08-26 DIAGNOSIS — G9341 Metabolic encephalopathy: Secondary | ICD-10-CM | POA: Diagnosis not present

## 2019-08-26 DIAGNOSIS — R4701 Aphasia: Secondary | ICD-10-CM | POA: Diagnosis not present

## 2019-08-26 DIAGNOSIS — J189 Pneumonia, unspecified organism: Secondary | ICD-10-CM | POA: Diagnosis not present

## 2019-08-30 DIAGNOSIS — M6281 Muscle weakness (generalized): Secondary | ICD-10-CM | POA: Diagnosis not present

## 2019-08-30 DIAGNOSIS — G9341 Metabolic encephalopathy: Secondary | ICD-10-CM | POA: Diagnosis not present

## 2019-08-30 DIAGNOSIS — J189 Pneumonia, unspecified organism: Secondary | ICD-10-CM | POA: Diagnosis not present

## 2019-08-30 DIAGNOSIS — R4701 Aphasia: Secondary | ICD-10-CM | POA: Diagnosis not present

## 2019-08-30 DIAGNOSIS — J440 Chronic obstructive pulmonary disease with acute lower respiratory infection: Secondary | ICD-10-CM | POA: Diagnosis not present

## 2019-08-30 DIAGNOSIS — J90 Pleural effusion, not elsewhere classified: Secondary | ICD-10-CM | POA: Diagnosis not present

## 2019-08-30 DIAGNOSIS — Z7984 Long term (current) use of oral hypoglycemic drugs: Secondary | ICD-10-CM | POA: Diagnosis not present

## 2019-08-30 DIAGNOSIS — E1140 Type 2 diabetes mellitus with diabetic neuropathy, unspecified: Secondary | ICD-10-CM | POA: Diagnosis not present

## 2019-08-31 DIAGNOSIS — E1140 Type 2 diabetes mellitus with diabetic neuropathy, unspecified: Secondary | ICD-10-CM | POA: Diagnosis not present

## 2019-08-31 DIAGNOSIS — R4701 Aphasia: Secondary | ICD-10-CM | POA: Diagnosis not present

## 2019-08-31 DIAGNOSIS — J189 Pneumonia, unspecified organism: Secondary | ICD-10-CM | POA: Diagnosis not present

## 2019-08-31 DIAGNOSIS — J90 Pleural effusion, not elsewhere classified: Secondary | ICD-10-CM | POA: Diagnosis not present

## 2019-08-31 DIAGNOSIS — Z7984 Long term (current) use of oral hypoglycemic drugs: Secondary | ICD-10-CM | POA: Diagnosis not present

## 2019-08-31 DIAGNOSIS — M6281 Muscle weakness (generalized): Secondary | ICD-10-CM | POA: Diagnosis not present

## 2019-08-31 DIAGNOSIS — J440 Chronic obstructive pulmonary disease with acute lower respiratory infection: Secondary | ICD-10-CM | POA: Diagnosis not present

## 2019-08-31 DIAGNOSIS — G9341 Metabolic encephalopathy: Secondary | ICD-10-CM | POA: Diagnosis not present

## 2019-09-01 DIAGNOSIS — M6281 Muscle weakness (generalized): Secondary | ICD-10-CM | POA: Diagnosis not present

## 2019-09-01 DIAGNOSIS — R4701 Aphasia: Secondary | ICD-10-CM | POA: Diagnosis not present

## 2019-09-01 DIAGNOSIS — J440 Chronic obstructive pulmonary disease with acute lower respiratory infection: Secondary | ICD-10-CM | POA: Diagnosis not present

## 2019-09-01 DIAGNOSIS — Z7984 Long term (current) use of oral hypoglycemic drugs: Secondary | ICD-10-CM | POA: Diagnosis not present

## 2019-09-01 DIAGNOSIS — J189 Pneumonia, unspecified organism: Secondary | ICD-10-CM | POA: Diagnosis not present

## 2019-09-01 DIAGNOSIS — G9341 Metabolic encephalopathy: Secondary | ICD-10-CM | POA: Diagnosis not present

## 2019-09-01 DIAGNOSIS — E1140 Type 2 diabetes mellitus with diabetic neuropathy, unspecified: Secondary | ICD-10-CM | POA: Diagnosis not present

## 2019-09-01 DIAGNOSIS — J90 Pleural effusion, not elsewhere classified: Secondary | ICD-10-CM | POA: Diagnosis not present

## 2019-09-02 ENCOUNTER — Encounter: Payer: Self-pay | Admitting: Family

## 2019-09-02 ENCOUNTER — Ambulatory Visit (INDEPENDENT_AMBULATORY_CARE_PROVIDER_SITE_OTHER): Payer: Medicare PPO | Admitting: Family

## 2019-09-02 DIAGNOSIS — J90 Pleural effusion, not elsewhere classified: Secondary | ICD-10-CM | POA: Diagnosis not present

## 2019-09-02 DIAGNOSIS — B373 Candidiasis of vulva and vagina: Secondary | ICD-10-CM

## 2019-09-02 DIAGNOSIS — M6281 Muscle weakness (generalized): Secondary | ICD-10-CM | POA: Diagnosis not present

## 2019-09-02 DIAGNOSIS — G9341 Metabolic encephalopathy: Secondary | ICD-10-CM | POA: Diagnosis not present

## 2019-09-02 DIAGNOSIS — R4701 Aphasia: Secondary | ICD-10-CM | POA: Diagnosis not present

## 2019-09-02 DIAGNOSIS — B3731 Acute candidiasis of vulva and vagina: Secondary | ICD-10-CM

## 2019-09-02 DIAGNOSIS — J440 Chronic obstructive pulmonary disease with acute lower respiratory infection: Secondary | ICD-10-CM | POA: Diagnosis not present

## 2019-09-02 DIAGNOSIS — J189 Pneumonia, unspecified organism: Secondary | ICD-10-CM | POA: Diagnosis not present

## 2019-09-02 DIAGNOSIS — Z7984 Long term (current) use of oral hypoglycemic drugs: Secondary | ICD-10-CM | POA: Diagnosis not present

## 2019-09-02 DIAGNOSIS — E1140 Type 2 diabetes mellitus with diabetic neuropathy, unspecified: Secondary | ICD-10-CM | POA: Diagnosis not present

## 2019-09-02 MED ORDER — FLUCONAZOLE 150 MG PO TABS: 150.0000 mg | ORAL_TABLET | ORAL | 0 refills | Status: DC | PRN

## 2019-09-02 NOTE — Progress Notes (Signed)
   Virtual Visit via telephone Note Due to COVID-19 pandemic this visit was conducted virtually. This visit type was conducted due to national recommendations for restrictions regarding the COVID-19 Pandemic (e.g. social distancing, sheltering in place) in an effort to limit this patient's exposure and mitigate transmission in our community. All issues noted in this document were discussed and addressed.  A physical exam was not performed with this format.  I connected with Kristi Willis daughter on 09/02/19 at 11:40 Am  by telephone and verified that I am speaking with the correct person using two identifiers. Kristi Willis is currently located at the office and no one  is currently with her during visit. The provider, Evelina Dun, FNP is located in their office at time of visit.  I discussed the limitations, risks, security and privacy concerns of performing an evaluation and management service by telephone and the availability of in person appointments. I also discussed with the patient that there may be a patient responsible charge related to this service. The patient expressed understanding and agreed to proceed.   History and Present Illness:  Pt has aphasia and daughter is doing all the talking for patient today. Pt has recently completed antibiotics and having vaginal itching now.   Vaginal Itching The patient's primary symptoms include genital itching. The patient's pertinent negatives include no vaginal bleeding or vaginal discharge. This is a new problem. The current episode started in the past 7 days. The problem occurs intermittently. The problem has been waxing and waning. The patient is experiencing no pain. Pertinent negatives include no constipation, discolored urine, dysuria, fever, flank pain, frequency or hematuria. She has tried nothing for the symptoms. The treatment provided no relief.      Review of Systems  Constitutional: Negative for fever.    Gastrointestinal: Negative for constipation.  Genitourinary: Negative for dysuria, flank pain, frequency, hematuria and vaginal discharge.     Observations/Objective: No SOB or distress noted, daughter did all the talking  Assessment and Plan: 1. Vagina, candidiasis Keep clean and dry Avoid scratching Call if symptoms worsen or do not improve  - fluconazole (DIFLUCAN) 150 MG tablet; Take 1 tablet (150 mg total) by mouth every three (3) days as needed.  Dispense: 3 tablet; Refill: 0   Follow Up Instructions: As needed    I discussed the assessment and treatment plan with the patient. The patient was provided an opportunity to ask questions and all were answered. The patient agreed with the plan and demonstrated an understanding of the instructions.   The patient was advised to call back or seek an in-person evaluation if the symptoms worsen or if the condition fails to improve as anticipated.  The above assessment and management plan was discussed with the patient. The patient verbalized understanding of and has agreed to the management plan. Patient is aware to call the clinic if symptoms persist or worsen. Patient is aware when to return to the clinic for a follow-up visit. Patient educated on when it is appropriate to go to the emergency department.   Time call ended:  11:48 Am  I provided 8 minutes of non-face-to-face time during this encounter.    Evelina Dun, FNP

## 2019-09-03 DIAGNOSIS — J3089 Other allergic rhinitis: Secondary | ICD-10-CM | POA: Diagnosis not present

## 2019-09-03 DIAGNOSIS — J301 Allergic rhinitis due to pollen: Secondary | ICD-10-CM | POA: Diagnosis not present

## 2019-09-03 DIAGNOSIS — J3081 Allergic rhinitis due to animal (cat) (dog) hair and dander: Secondary | ICD-10-CM | POA: Diagnosis not present

## 2019-09-07 DIAGNOSIS — M6281 Muscle weakness (generalized): Secondary | ICD-10-CM | POA: Diagnosis not present

## 2019-09-07 DIAGNOSIS — J189 Pneumonia, unspecified organism: Secondary | ICD-10-CM | POA: Diagnosis not present

## 2019-09-07 DIAGNOSIS — Z7984 Long term (current) use of oral hypoglycemic drugs: Secondary | ICD-10-CM | POA: Diagnosis not present

## 2019-09-07 DIAGNOSIS — R4701 Aphasia: Secondary | ICD-10-CM | POA: Diagnosis not present

## 2019-09-07 DIAGNOSIS — G9341 Metabolic encephalopathy: Secondary | ICD-10-CM | POA: Diagnosis not present

## 2019-09-07 DIAGNOSIS — J90 Pleural effusion, not elsewhere classified: Secondary | ICD-10-CM | POA: Diagnosis not present

## 2019-09-07 DIAGNOSIS — J440 Chronic obstructive pulmonary disease with acute lower respiratory infection: Secondary | ICD-10-CM | POA: Diagnosis not present

## 2019-09-07 DIAGNOSIS — E1140 Type 2 diabetes mellitus with diabetic neuropathy, unspecified: Secondary | ICD-10-CM | POA: Diagnosis not present

## 2019-09-08 ENCOUNTER — Other Ambulatory Visit: Payer: Self-pay

## 2019-09-08 ENCOUNTER — Ambulatory Visit (HOSPITAL_COMMUNITY)
Admission: RE | Admit: 2019-09-08 | Discharge: 2019-09-08 | Disposition: A | Discharge status: Home / Self Care | Payer: Medicare PPO | Source: Ambulatory Visit | Attending: Family | Admitting: Family

## 2019-09-08 DIAGNOSIS — Z7984 Long term (current) use of oral hypoglycemic drugs: Secondary | ICD-10-CM | POA: Diagnosis not present

## 2019-09-08 DIAGNOSIS — J189 Pneumonia, unspecified organism: Secondary | ICD-10-CM | POA: Diagnosis not present

## 2019-09-08 DIAGNOSIS — G9341 Metabolic encephalopathy: Secondary | ICD-10-CM | POA: Diagnosis not present

## 2019-09-08 DIAGNOSIS — R4701 Aphasia: Secondary | ICD-10-CM | POA: Diagnosis not present

## 2019-09-08 DIAGNOSIS — Z1231 Encounter for screening mammogram for malignant neoplasm of breast: Secondary | ICD-10-CM | POA: Insufficient documentation

## 2019-09-08 DIAGNOSIS — J440 Chronic obstructive pulmonary disease with acute lower respiratory infection: Secondary | ICD-10-CM | POA: Diagnosis not present

## 2019-09-08 DIAGNOSIS — E1140 Type 2 diabetes mellitus with diabetic neuropathy, unspecified: Secondary | ICD-10-CM | POA: Diagnosis not present

## 2019-09-08 DIAGNOSIS — J90 Pleural effusion, not elsewhere classified: Secondary | ICD-10-CM | POA: Diagnosis not present

## 2019-09-08 DIAGNOSIS — M6281 Muscle weakness (generalized): Secondary | ICD-10-CM | POA: Diagnosis not present

## 2019-09-10 DIAGNOSIS — J301 Allergic rhinitis due to pollen: Secondary | ICD-10-CM | POA: Diagnosis not present

## 2019-09-10 DIAGNOSIS — J3089 Other allergic rhinitis: Secondary | ICD-10-CM | POA: Diagnosis not present

## 2019-09-10 DIAGNOSIS — J3081 Allergic rhinitis due to animal (cat) (dog) hair and dander: Secondary | ICD-10-CM | POA: Diagnosis not present

## 2019-09-14 ENCOUNTER — Other Ambulatory Visit: Payer: Self-pay | Admitting: Family

## 2019-09-15 DIAGNOSIS — J90 Pleural effusion, not elsewhere classified: Secondary | ICD-10-CM | POA: Diagnosis not present

## 2019-09-15 DIAGNOSIS — R4701 Aphasia: Secondary | ICD-10-CM | POA: Diagnosis not present

## 2019-09-15 DIAGNOSIS — M6281 Muscle weakness (generalized): Secondary | ICD-10-CM | POA: Diagnosis not present

## 2019-09-15 DIAGNOSIS — J3089 Other allergic rhinitis: Secondary | ICD-10-CM | POA: Diagnosis not present

## 2019-09-15 DIAGNOSIS — J189 Pneumonia, unspecified organism: Secondary | ICD-10-CM | POA: Diagnosis not present

## 2019-09-15 DIAGNOSIS — G9341 Metabolic encephalopathy: Secondary | ICD-10-CM | POA: Diagnosis not present

## 2019-09-15 DIAGNOSIS — Z7984 Long term (current) use of oral hypoglycemic drugs: Secondary | ICD-10-CM | POA: Diagnosis not present

## 2019-09-15 DIAGNOSIS — E1140 Type 2 diabetes mellitus with diabetic neuropathy, unspecified: Secondary | ICD-10-CM | POA: Diagnosis not present

## 2019-09-15 DIAGNOSIS — J301 Allergic rhinitis due to pollen: Secondary | ICD-10-CM | POA: Diagnosis not present

## 2019-09-15 DIAGNOSIS — J3081 Allergic rhinitis due to animal (cat) (dog) hair and dander: Secondary | ICD-10-CM | POA: Diagnosis not present

## 2019-09-15 DIAGNOSIS — J440 Chronic obstructive pulmonary disease with acute lower respiratory infection: Secondary | ICD-10-CM | POA: Diagnosis not present

## 2019-09-17 ENCOUNTER — Ambulatory Visit: Payer: Medicare PPO

## 2019-09-17 DIAGNOSIS — J440 Chronic obstructive pulmonary disease with acute lower respiratory infection: Secondary | ICD-10-CM | POA: Diagnosis not present

## 2019-09-17 DIAGNOSIS — G9341 Metabolic encephalopathy: Secondary | ICD-10-CM | POA: Diagnosis not present

## 2019-09-17 DIAGNOSIS — E1140 Type 2 diabetes mellitus with diabetic neuropathy, unspecified: Secondary | ICD-10-CM | POA: Diagnosis not present

## 2019-09-17 DIAGNOSIS — J90 Pleural effusion, not elsewhere classified: Secondary | ICD-10-CM | POA: Diagnosis not present

## 2019-09-17 DIAGNOSIS — J189 Pneumonia, unspecified organism: Secondary | ICD-10-CM | POA: Diagnosis not present

## 2019-09-17 DIAGNOSIS — M6281 Muscle weakness (generalized): Secondary | ICD-10-CM | POA: Diagnosis not present

## 2019-09-17 DIAGNOSIS — R4701 Aphasia: Secondary | ICD-10-CM | POA: Diagnosis not present

## 2019-09-17 DIAGNOSIS — Z7984 Long term (current) use of oral hypoglycemic drugs: Secondary | ICD-10-CM | POA: Diagnosis not present

## 2019-09-20 ENCOUNTER — Other Ambulatory Visit: Payer: Self-pay | Admitting: Family

## 2019-09-22 DIAGNOSIS — E1140 Type 2 diabetes mellitus with diabetic neuropathy, unspecified: Secondary | ICD-10-CM | POA: Diagnosis not present

## 2019-09-22 DIAGNOSIS — G9341 Metabolic encephalopathy: Secondary | ICD-10-CM | POA: Diagnosis not present

## 2019-09-22 DIAGNOSIS — J189 Pneumonia, unspecified organism: Secondary | ICD-10-CM | POA: Diagnosis not present

## 2019-09-22 DIAGNOSIS — M6281 Muscle weakness (generalized): Secondary | ICD-10-CM | POA: Diagnosis not present

## 2019-09-22 DIAGNOSIS — Z7984 Long term (current) use of oral hypoglycemic drugs: Secondary | ICD-10-CM | POA: Diagnosis not present

## 2019-09-22 DIAGNOSIS — R4701 Aphasia: Secondary | ICD-10-CM | POA: Diagnosis not present

## 2019-09-22 DIAGNOSIS — J440 Chronic obstructive pulmonary disease with acute lower respiratory infection: Secondary | ICD-10-CM | POA: Diagnosis not present

## 2019-09-22 DIAGNOSIS — J90 Pleural effusion, not elsewhere classified: Secondary | ICD-10-CM | POA: Diagnosis not present

## 2019-09-23 DIAGNOSIS — M6281 Muscle weakness (generalized): Secondary | ICD-10-CM | POA: Diagnosis not present

## 2019-09-23 DIAGNOSIS — E1140 Type 2 diabetes mellitus with diabetic neuropathy, unspecified: Secondary | ICD-10-CM | POA: Diagnosis not present

## 2019-09-23 DIAGNOSIS — G9341 Metabolic encephalopathy: Secondary | ICD-10-CM | POA: Diagnosis not present

## 2019-09-23 DIAGNOSIS — J90 Pleural effusion, not elsewhere classified: Secondary | ICD-10-CM | POA: Diagnosis not present

## 2019-09-23 DIAGNOSIS — J189 Pneumonia, unspecified organism: Secondary | ICD-10-CM | POA: Diagnosis not present

## 2019-09-23 DIAGNOSIS — Z7984 Long term (current) use of oral hypoglycemic drugs: Secondary | ICD-10-CM | POA: Diagnosis not present

## 2019-09-23 DIAGNOSIS — J440 Chronic obstructive pulmonary disease with acute lower respiratory infection: Secondary | ICD-10-CM | POA: Diagnosis not present

## 2019-09-23 DIAGNOSIS — R4701 Aphasia: Secondary | ICD-10-CM | POA: Diagnosis not present

## 2019-09-27 ENCOUNTER — Ambulatory Visit: Payer: Medicare PPO

## 2019-09-28 DIAGNOSIS — J301 Allergic rhinitis due to pollen: Secondary | ICD-10-CM | POA: Diagnosis not present

## 2019-09-28 DIAGNOSIS — J3081 Allergic rhinitis due to animal (cat) (dog) hair and dander: Secondary | ICD-10-CM | POA: Diagnosis not present

## 2019-09-28 DIAGNOSIS — J189 Pneumonia, unspecified organism: Secondary | ICD-10-CM | POA: Diagnosis not present

## 2019-09-28 DIAGNOSIS — Z7984 Long term (current) use of oral hypoglycemic drugs: Secondary | ICD-10-CM | POA: Diagnosis not present

## 2019-09-28 DIAGNOSIS — G9341 Metabolic encephalopathy: Secondary | ICD-10-CM | POA: Diagnosis not present

## 2019-09-28 DIAGNOSIS — R4701 Aphasia: Secondary | ICD-10-CM | POA: Diagnosis not present

## 2019-09-28 DIAGNOSIS — E1140 Type 2 diabetes mellitus with diabetic neuropathy, unspecified: Secondary | ICD-10-CM | POA: Diagnosis not present

## 2019-09-28 DIAGNOSIS — J3089 Other allergic rhinitis: Secondary | ICD-10-CM | POA: Diagnosis not present

## 2019-09-28 DIAGNOSIS — J440 Chronic obstructive pulmonary disease with acute lower respiratory infection: Secondary | ICD-10-CM | POA: Diagnosis not present

## 2019-09-28 DIAGNOSIS — J90 Pleural effusion, not elsewhere classified: Secondary | ICD-10-CM | POA: Diagnosis not present

## 2019-09-28 DIAGNOSIS — M6281 Muscle weakness (generalized): Secondary | ICD-10-CM | POA: Diagnosis not present

## 2019-09-29 DIAGNOSIS — Z7984 Long term (current) use of oral hypoglycemic drugs: Secondary | ICD-10-CM | POA: Diagnosis not present

## 2019-09-29 DIAGNOSIS — E1140 Type 2 diabetes mellitus with diabetic neuropathy, unspecified: Secondary | ICD-10-CM | POA: Diagnosis not present

## 2019-09-29 DIAGNOSIS — J440 Chronic obstructive pulmonary disease with acute lower respiratory infection: Secondary | ICD-10-CM | POA: Diagnosis not present

## 2019-09-29 DIAGNOSIS — J189 Pneumonia, unspecified organism: Secondary | ICD-10-CM | POA: Diagnosis not present

## 2019-09-29 DIAGNOSIS — J301 Allergic rhinitis due to pollen: Secondary | ICD-10-CM | POA: Diagnosis not present

## 2019-09-29 DIAGNOSIS — M6281 Muscle weakness (generalized): Secondary | ICD-10-CM | POA: Diagnosis not present

## 2019-09-29 DIAGNOSIS — R4701 Aphasia: Secondary | ICD-10-CM | POA: Diagnosis not present

## 2019-09-29 DIAGNOSIS — G9341 Metabolic encephalopathy: Secondary | ICD-10-CM | POA: Diagnosis not present

## 2019-09-29 DIAGNOSIS — J90 Pleural effusion, not elsewhere classified: Secondary | ICD-10-CM | POA: Diagnosis not present

## 2019-10-04 ENCOUNTER — Ambulatory Visit: Payer: Medicare PPO

## 2019-10-05 DIAGNOSIS — J90 Pleural effusion, not elsewhere classified: Secondary | ICD-10-CM | POA: Diagnosis not present

## 2019-10-05 DIAGNOSIS — M6281 Muscle weakness (generalized): Secondary | ICD-10-CM | POA: Diagnosis not present

## 2019-10-05 DIAGNOSIS — J189 Pneumonia, unspecified organism: Secondary | ICD-10-CM | POA: Diagnosis not present

## 2019-10-05 DIAGNOSIS — J440 Chronic obstructive pulmonary disease with acute lower respiratory infection: Secondary | ICD-10-CM | POA: Diagnosis not present

## 2019-10-05 DIAGNOSIS — R4701 Aphasia: Secondary | ICD-10-CM | POA: Diagnosis not present

## 2019-10-05 DIAGNOSIS — G9341 Metabolic encephalopathy: Secondary | ICD-10-CM | POA: Diagnosis not present

## 2019-10-05 DIAGNOSIS — E1140 Type 2 diabetes mellitus with diabetic neuropathy, unspecified: Secondary | ICD-10-CM | POA: Diagnosis not present

## 2019-10-05 DIAGNOSIS — Z7984 Long term (current) use of oral hypoglycemic drugs: Secondary | ICD-10-CM | POA: Diagnosis not present

## 2019-10-08 ENCOUNTER — Other Ambulatory Visit: Payer: Self-pay | Admitting: Family

## 2019-10-08 DIAGNOSIS — J301 Allergic rhinitis due to pollen: Secondary | ICD-10-CM | POA: Diagnosis not present

## 2019-10-08 DIAGNOSIS — J3089 Other allergic rhinitis: Secondary | ICD-10-CM | POA: Diagnosis not present

## 2019-10-08 DIAGNOSIS — M6281 Muscle weakness (generalized): Secondary | ICD-10-CM | POA: Diagnosis not present

## 2019-10-08 DIAGNOSIS — J440 Chronic obstructive pulmonary disease with acute lower respiratory infection: Secondary | ICD-10-CM | POA: Diagnosis not present

## 2019-10-08 DIAGNOSIS — J189 Pneumonia, unspecified organism: Secondary | ICD-10-CM | POA: Diagnosis not present

## 2019-10-08 DIAGNOSIS — G9341 Metabolic encephalopathy: Secondary | ICD-10-CM | POA: Diagnosis not present

## 2019-10-08 DIAGNOSIS — R4701 Aphasia: Secondary | ICD-10-CM | POA: Diagnosis not present

## 2019-10-08 DIAGNOSIS — Z7984 Long term (current) use of oral hypoglycemic drugs: Secondary | ICD-10-CM | POA: Diagnosis not present

## 2019-10-08 DIAGNOSIS — J90 Pleural effusion, not elsewhere classified: Secondary | ICD-10-CM | POA: Diagnosis not present

## 2019-10-08 DIAGNOSIS — J3081 Allergic rhinitis due to animal (cat) (dog) hair and dander: Secondary | ICD-10-CM | POA: Diagnosis not present

## 2019-10-08 DIAGNOSIS — E1140 Type 2 diabetes mellitus with diabetic neuropathy, unspecified: Secondary | ICD-10-CM | POA: Diagnosis not present

## 2019-10-13 DIAGNOSIS — J440 Chronic obstructive pulmonary disease with acute lower respiratory infection: Secondary | ICD-10-CM | POA: Diagnosis not present

## 2019-10-13 DIAGNOSIS — J301 Allergic rhinitis due to pollen: Secondary | ICD-10-CM | POA: Diagnosis not present

## 2019-10-13 DIAGNOSIS — R4701 Aphasia: Secondary | ICD-10-CM | POA: Diagnosis not present

## 2019-10-13 DIAGNOSIS — J189 Pneumonia, unspecified organism: Secondary | ICD-10-CM | POA: Diagnosis not present

## 2019-10-13 DIAGNOSIS — J90 Pleural effusion, not elsewhere classified: Secondary | ICD-10-CM | POA: Diagnosis not present

## 2019-10-13 DIAGNOSIS — J3089 Other allergic rhinitis: Secondary | ICD-10-CM | POA: Diagnosis not present

## 2019-10-13 DIAGNOSIS — Z7984 Long term (current) use of oral hypoglycemic drugs: Secondary | ICD-10-CM | POA: Diagnosis not present

## 2019-10-13 DIAGNOSIS — G9341 Metabolic encephalopathy: Secondary | ICD-10-CM | POA: Diagnosis not present

## 2019-10-13 DIAGNOSIS — E1140 Type 2 diabetes mellitus with diabetic neuropathy, unspecified: Secondary | ICD-10-CM | POA: Diagnosis not present

## 2019-10-13 DIAGNOSIS — M6281 Muscle weakness (generalized): Secondary | ICD-10-CM | POA: Diagnosis not present

## 2019-10-14 DIAGNOSIS — Z7984 Long term (current) use of oral hypoglycemic drugs: Secondary | ICD-10-CM | POA: Diagnosis not present

## 2019-10-14 DIAGNOSIS — G9341 Metabolic encephalopathy: Secondary | ICD-10-CM | POA: Diagnosis not present

## 2019-10-14 DIAGNOSIS — J90 Pleural effusion, not elsewhere classified: Secondary | ICD-10-CM | POA: Diagnosis not present

## 2019-10-14 DIAGNOSIS — J440 Chronic obstructive pulmonary disease with acute lower respiratory infection: Secondary | ICD-10-CM | POA: Diagnosis not present

## 2019-10-14 DIAGNOSIS — R4701 Aphasia: Secondary | ICD-10-CM | POA: Diagnosis not present

## 2019-10-14 DIAGNOSIS — J189 Pneumonia, unspecified organism: Secondary | ICD-10-CM | POA: Diagnosis not present

## 2019-10-14 DIAGNOSIS — E1140 Type 2 diabetes mellitus with diabetic neuropathy, unspecified: Secondary | ICD-10-CM | POA: Diagnosis not present

## 2019-10-14 DIAGNOSIS — M6281 Muscle weakness (generalized): Secondary | ICD-10-CM | POA: Diagnosis not present

## 2019-10-18 DIAGNOSIS — J301 Allergic rhinitis due to pollen: Secondary | ICD-10-CM | POA: Diagnosis not present

## 2019-10-18 DIAGNOSIS — J3089 Other allergic rhinitis: Secondary | ICD-10-CM | POA: Diagnosis not present

## 2019-10-19 DIAGNOSIS — E1140 Type 2 diabetes mellitus with diabetic neuropathy, unspecified: Secondary | ICD-10-CM | POA: Diagnosis not present

## 2019-10-19 DIAGNOSIS — J189 Pneumonia, unspecified organism: Secondary | ICD-10-CM | POA: Diagnosis not present

## 2019-10-19 DIAGNOSIS — G9341 Metabolic encephalopathy: Secondary | ICD-10-CM | POA: Diagnosis not present

## 2019-10-19 DIAGNOSIS — J90 Pleural effusion, not elsewhere classified: Secondary | ICD-10-CM | POA: Diagnosis not present

## 2019-10-19 DIAGNOSIS — R4701 Aphasia: Secondary | ICD-10-CM | POA: Diagnosis not present

## 2019-10-19 DIAGNOSIS — J440 Chronic obstructive pulmonary disease with acute lower respiratory infection: Secondary | ICD-10-CM | POA: Diagnosis not present

## 2019-10-19 DIAGNOSIS — M6281 Muscle weakness (generalized): Secondary | ICD-10-CM | POA: Diagnosis not present

## 2019-10-19 DIAGNOSIS — Z7984 Long term (current) use of oral hypoglycemic drugs: Secondary | ICD-10-CM | POA: Diagnosis not present

## 2019-10-20 DIAGNOSIS — J189 Pneumonia, unspecified organism: Secondary | ICD-10-CM | POA: Diagnosis not present

## 2019-10-20 DIAGNOSIS — J440 Chronic obstructive pulmonary disease with acute lower respiratory infection: Secondary | ICD-10-CM | POA: Diagnosis not present

## 2019-10-20 DIAGNOSIS — E1140 Type 2 diabetes mellitus with diabetic neuropathy, unspecified: Secondary | ICD-10-CM | POA: Diagnosis not present

## 2019-10-20 DIAGNOSIS — R4701 Aphasia: Secondary | ICD-10-CM | POA: Diagnosis not present

## 2019-10-20 DIAGNOSIS — M6281 Muscle weakness (generalized): Secondary | ICD-10-CM | POA: Diagnosis not present

## 2019-10-20 DIAGNOSIS — Z7984 Long term (current) use of oral hypoglycemic drugs: Secondary | ICD-10-CM | POA: Diagnosis not present

## 2019-10-20 DIAGNOSIS — J90 Pleural effusion, not elsewhere classified: Secondary | ICD-10-CM | POA: Diagnosis not present

## 2019-10-20 DIAGNOSIS — G9341 Metabolic encephalopathy: Secondary | ICD-10-CM | POA: Diagnosis not present

## 2019-10-25 ENCOUNTER — Other Ambulatory Visit: Payer: Self-pay

## 2019-10-25 ENCOUNTER — Encounter: Payer: Self-pay | Admitting: Family

## 2019-10-25 ENCOUNTER — Ambulatory Visit: Payer: Medicare PPO | Admitting: Family

## 2019-10-25 ENCOUNTER — Other Ambulatory Visit: Payer: Self-pay | Admitting: Family

## 2019-10-25 ENCOUNTER — Other Ambulatory Visit: Payer: Self-pay | Admitting: "Endocrinology

## 2019-10-25 VITALS — BP 123/50 | HR 64 | Temp 98.0°F | Ht 65.0 in | Wt 164.0 lb

## 2019-10-25 DIAGNOSIS — M7582 Other shoulder lesions, left shoulder: Secondary | ICD-10-CM

## 2019-10-25 MED ORDER — NAPROXEN 500 MG PO TABS: 500.0000 mg | ORAL_TABLET | Freq: Two times a day (BID) | ORAL | 0 refills | Status: DC

## 2019-10-25 NOTE — Progress Notes (Signed)
   Subjective:    Patient ID: Kristi Willis, female    DOB: 1941/07/11, 79 y.o.   MRN: [id number]  Chief Complaint  Patient presents with  . Arm Pain    left arm pain 4-6 months, keeps up at night     Shoulder Pain  The pain is present in the left shoulder. This is a new problem. The current episode started more than 1 month ago. There has been no history of extremity trauma. The problem occurs intermittently. The problem has been waxing and waning. The quality of the pain is described as aching. The pain is at a severity of 8/10. The pain is moderate. Associated symptoms include a limited range of motion. Pertinent negatives include no numbness, stiffness or tingling. The symptoms are aggravated by activity. She has tried NSAIDS for the symptoms. The treatment provided mild relief. Her past medical history is significant for osteoarthritis.      Review of Systems  Musculoskeletal: Negative for stiffness.  Neurological: Negative for tingling and numbness.  All other systems reviewed and are negative.      Objective:   Physical Exam Vitals reviewed.  Constitutional:      General: She is not in acute distress.    Appearance: She is well-developed.  HENT:     Head: Normocephalic and atraumatic.  Eyes:     Pupils: Pupils are equal, round, and reactive to light.  Neck:     Thyroid: No thyromegaly.  Cardiovascular:     Rate and Rhythm: Normal rate and regular rhythm.     Heart sounds: Normal heart sounds. No murmur.  Pulmonary:     Effort: Pulmonary effort is normal. No respiratory distress.     Breath sounds: Normal breath sounds. No wheezing.  Abdominal:     General: Bowel sounds are normal. There is no distension.     Palpations: Abdomen is soft.     Tenderness: There is no abdominal tenderness.  Musculoskeletal:        General: No tenderness.     Cervical back: Normal range of motion and neck supple.     Comments: Pain in left shoulder with flexion and internal  rotation  Skin:    General: Skin is warm and dry.  Neurological:     Mental Status: She is alert and oriented to person, place, and time.     Cranial Nerves: No cranial nerve deficit.     Deep Tendon Reflexes: Reflexes are normal and symmetric.  Psychiatric:        Behavior: Behavior normal.        Thought Content: Thought content normal.        Judgment: Judgment normal.     BP (!) 123/50   Pulse 64   Temp 98 F (36.7 C)   Ht 5\' 5"  (1.651 m)   Wt 164 lb (74.4 kg)   SpO2 97%   BMI 27.29 kg/m        Assessment & Plan:  Kristi Willis comes in today with chief complaint of Arm Pain (left arm pain 4-6 months, keeps up at night )   Diagnosis and orders addressed:  1. Rotator cuff tendinitis, left Rest Ice  ROM exercises encouraged Daughter wants referral to Ortho and hold off on steroids - Ambulatory referral to Orthopedic Surgery - naproxen (NAPROSYN) 500 MG tablet; Take 1 tablet (500 mg total) by mouth 2 (two) times daily with a meal.  Dispense: 30 tablet; Refill: 0  Evelina Dun, FNP

## 2019-10-25 NOTE — Patient Instructions (Signed)
Rotator Cuff Tendinitis  Rotator cuff tendinitis is inflammation of the tough, cord-like bands that connect muscle to bone (tendons) in the rotator cuff. The rotator cuff includes all of the muscles and tendons that connect the arm to the shoulder. The rotator cuff holds the head of the upper arm bone (humerus) in the cup (fossa) of the shoulder blade (scapula). This condition can lead to a long-lasting (chronic) tear. The tear may be partial or complete. What are the causes? This condition is usually caused by overusing the rotator cuff. What increases the risk? This condition is more likely to develop in athletes and workers who frequently use their shoulder or reach over their heads. This can include activities such as:  Tennis.  Baseball or softball.  Swimming.  Construction work.  Painting. What are the signs or symptoms? Symptoms of this condition include:  Pain spreading (radiating) from the shoulder to the upper arm.  Swelling and tenderness in front of the shoulder.  Pain when reaching, pulling, or lifting the arm above the head.  Pain when lowering the arm from above the head.  Minor pain in the shoulder when resting.  Increased pain in the shoulder at night.  Difficulty placing the arm behind the back. How is this diagnosed? This condition is diagnosed with a medical history and physical exam. Tests may also be done, including:  X-rays.  MRI.  Ultrasounds.  CT or MR arthrogram. During this test, a contrast material is injected and then images are taken. How is this treated? Treatment for this condition depends on the severity of the condition. In less severe cases, treatment may include:  Rest. This may be done with a sling that holds the shoulder still (immobilization). Your health care provider may also recommend avoiding activities that involve lifting your arm over your head.  Icing the shoulder.  Anti-inflammatory medicines, such as aspirin or  ibuprofen. In more severe cases, treatment may include:  Physical therapy.  Steroid injections.  Surgery. Follow these instructions at home: If you have a sling:  Wear the sling as told by your health care provider. Remove it only as told by your health care provider.  Loosen the sling if your fingers tingle, become numb, or turn cold and blue.  Keep the sling clean.  If the sling is not waterproof, do not let it get wet. Remove it, if allowed, or cover it with a watertight covering when you take a bath or shower. Managing pain, stiffness, and swelling  If directed, put ice on the injured area. ? If you have a removable sling, remove it as told by your health care provider. ? Put ice in a plastic bag. ? Place a towel between your skin and the bag. ? Leave the ice on for 20 minutes, 2-3 times a day.  Move your fingers often to avoid stiffness and to lessen swelling.  Raise (elevate) the injured area above the level of your heart while you are lying down.  Find a comfortable sleeping position or sleep on a recliner, if available. Driving  Do not drive or use heavy machinery while taking prescription pain medicine.  Ask your health care provider when it is safe to drive if you have a sling on your arm. Activity  Rest your shoulder as told by your health care provider.  Return to your normal activities as told by your health care provider. Ask your health care provider what activities are safe for you.  Do any exercises or stretches as   told by your health care provider.  If you do repetitive overhead tasks, take small breaks in between and include stretching exercises as told by your health care provider. General instructions  Do not use any products that contain nicotine or tobacco, such as cigarettes and e-cigarettes. These can delay healing. If you need help quitting, ask your health care provider.  Take over-the-counter and prescription medicines only as told by your  health care provider.  Keep all follow-up visits as told by your health care provider. This is important. Contact a health care provider if:  Your pain gets worse.  You have new pain in your arm, hands, or fingers.  Your pain is not relieved with medicine or does not get better after 6 weeks of treatment.  You have cracking sensations when moving your shoulder in certain directions.  You hear a snapping sound after using your shoulder, followed by severe pain and weakness. Get help right away if:  Your arm, hand, or fingers are numb or tingling.  Your arm, hand, or fingers are swollen or painful or they turn white or blue. Summary  Rotator cuff tendinitis is inflammation of the tough, cord-like bands that connect muscle to bone (tendons) in the rotator cuff.  This condition is usually caused by overusing the rotator cuff, which includes all of the muscles and tendons that connect the arm to the shoulder.  This condition is more likely to develop in athletes and workers who frequently use their shoulder or reach over their heads.  Treatment generally includes rest, anti-inflammatory medicines, and icing. In some cases, physical therapy and steroid injections may be needed. In severe cases, surgery may be needed. This information is not intended to replace advice given to you by your health care provider. Make sure you discuss any questions you have with your health care provider. Document Revised: 11/27/2018 Document Reviewed: 07/22/2016 Elsevier Patient Education  2020 [employer]  

## 2019-10-27 ENCOUNTER — Ambulatory Visit: Payer: Medicare Other | Admitting: Cardiology

## 2019-10-28 DIAGNOSIS — J3089 Other allergic rhinitis: Secondary | ICD-10-CM | POA: Diagnosis not present

## 2019-10-28 DIAGNOSIS — J301 Allergic rhinitis due to pollen: Secondary | ICD-10-CM | POA: Diagnosis not present

## 2019-10-30 NOTE — Progress Notes (Signed)
Cardiology Office Note:    Date:  11/01/2019   ID:  Kristi Willis, DOB 1941-04-26, MRN [id number]  PCP:  Sharion Balloon, FNP  Cardiologist:  No primary care provider on file.  Electrophysiologist:  None   Referring MD: Sharion Balloon, FNP   Chief Complaint  Patient presents with  . Chest Pain    History of Present Illness:    Kristi Willis is a 79 y.o. female with a hx of type 2 diabetes, hypertension, hyperlipidemia, atrial tachycardia, aphasia, asthma who presents for follow-up.  She was referred by Evelina Dun, FNP for evaluation of chest pain, initially seen on 07/28/2019.  History is partially provided by her daughter given patient's aphasia.  On 12/1, daughter reports that patient was walking outside and came inside reporting shortness of breath and heaviness in her chest.  She went to the ED. Unfortunately had long wait in the ED so left without being seen.  Troponins were drawn, which were negative x2.  Reports that she has had chest heaviness intermittently since that time.  Lasts a few minutes and resolves.  Also has been having lower extremity edema recently.  Never smoker.  Sister had MI around age 49.  Father had MI in 32s.  Previously followed with Dr. Caryl Comes for atrial tachycardia.  Would feel like heart is racing.  Reports has been well controlled on Toprol-XL 100 mg twice daily.    After initial clinic visit on 07/28/2019, coronary CTA was ordered but has not been done.  TTE was done on 07/29/2019, which shows EF 60 to 65%, normal RV function, severe left atrial dilatation, no significant valvular disease.  Lower extremity duplex was done to evaluate lower extremity edema, which showed no evidence of DVT.  Since last clinic visit, patient had admission to [facility] from 12/16 through 08/07/2019 with pneumonia.  She initially was treated with IV ceftriaxone, but converted to oral Keflex for discharge.  Since that time, she reports that she has been doing well.  Denies  any further chest pain.  Main complaint today is left arm pain, for which she has an appointment with her orthopedist.    Past Medical History:  Diagnosis Date  . Allergy   . Asthma   . Cancer ([place])    skin  . Diabetes mellitus   . GERD (gastroesophageal reflux disease)    hiatal hernia  . Hypercholesteremia   . Hypertension   . Neuromuscular disorder (HCC)    DM neuropathy  . Neuropathy   . SVT (supraventricular tachycardia) (HCC)     Past Surgical History:  Procedure Laterality Date  . BREAST BIOPSY     left breast biopsy  . LUMBAR LAMINECTOMY  08/10  . SKIN CANCER EXCISION  2005  . SPINE SURGERY     lumbar laminectomy    Current Medications: Current Meds  Medication Sig  . Accu-Chek Softclix Lancets lancets Check sugars daily & as needed Dx E11.9  . acetaminophen (TYLENOL) 325 MG tablet Take 2 tablets (650 mg total) by mouth every 6 (six) hours as needed for mild pain or fever (or Fever >/= 101).  Marland Kitchen acyclovir ointment (ZOVIRAX) 5 % APPLY TO THE AFFECTED AREA AROUND MOUTH EVERY 3 HOURS AS DIRECTED UNTIL CLEAR (Patient taking differently: Apply 1 application topically every 3 (three) hours. Using as needed for cold sores)  . albuterol (VENTOLIN HFA) 108 (90 BASE) MCG/ACT inhaler Inhale 2 puffs into the lungs every 6 (six) hours as needed for wheezing.  Marland Kitchen  amLODipine (NORVASC) 5 MG tablet TAKE 1 TABLET DAILY.  Marland Kitchen aspirin 81 MG chewable tablet Chew 81 mg by mouth daily.  Marland Kitchen azelastine (ASTELIN) 0.1 % nasal spray Place 2 sprays into both nostrils 2 (two) times daily.  . Blood Glucose Monitoring Suppl (ACCU-CHEK AVIVA PLUS) w/Device KIT Check sugars daily & as needed Dx E11.9  . Calcium Carbonate-Vitamin D (CALTRATE 600+D) 600-400 MG-UNIT per tablet Take 1 tablet by mouth 2 (two) times daily.  Marland Kitchen doxazosin (CARDURA) 8 MG tablet TAKE (1) TABLET DAILY FOR HIGH BLOOD PRESSURE. (Patient taking differently: Take 8 mg by mouth daily. )  . EPIPEN 2-PAK 0.3 MG/0.3ML SOAJ injection Inject  0.3 mg into the muscle as needed for anaphylaxis. Reported on 01/24/2016  . escitalopram (LEXAPRO) 20 MG tablet Take 1 tablet (20 mg total) by mouth daily.  . furosemide (LASIX) 20 MG tablet Take 1 tablet (20 mg total) by mouth daily as needed for fluid (Weight gain of 3 lbs in a day or 5 lbs in a week).  . gabapentin (NEURONTIN) 100 MG capsule Take 1 capsule (100 mg total) 3 (three) times daily by mouth.  Marland Kitchen glucose blood (ACCU-CHEK AVIVA PLUS) test strip Check sugars daily & as needed Dx E11.9  . ipratropium-albuterol (DUONEB) 0.5-2.5 (3) MG/3ML SOLN Take 3 mLs by nebulization 2 (two) times daily.  Marland Kitchen levothyroxine (SYNTHROID) 75 MCG tablet Take 1 tablet (75 mcg total) by mouth daily before breakfast.  . lisinopril (ZESTRIL) 40 MG tablet TAKE 1 TABLET DAILY  . loratadine (CLARITIN) 10 MG tablet Take 1 tablet (10 mg total) by mouth daily.  Marland Kitchen menthol-cetylpyridinium (CEPACOL) 3 MG lozenge Take 1 lozenge (3 mg total) by mouth as needed for sore throat.  . metFORMIN (GLUCOPHAGE) 1000 MG tablet TAKE  (1)  TABLET TWICE A DAY WITH MEALS (BREAKFAST AND SUPPER)  . metoprolol succinate (TOPROL-XL) 100 MG 24 hr tablet TAKE (1) TABLET TWICE A DAY.  . montelukast (SINGULAIR) 10 MG tablet Take 1 tablet (10 mg total) by mouth at bedtime.  . Multiple Vitamin (MULTIVITAMIN) tablet Take 1 tablet by mouth daily.    . naproxen (NAPROSYN) 500 MG tablet Take 1 tablet (500 mg total) by mouth 2 (two) times daily with a meal.  . Omega-3 Fatty Acids (FISH OIL) 1200 MG CAPS Take 1 capsule by mouth daily.   . permethrin (ELIMITE) 5 % cream Apply 1 application topically at bedtime. Apply form the chin down on days 1, 2, and 7 then wash upon waking up.  . Pyridoxine HCl (VITAMIN B-6 PO) Take 1 tablet by mouth 2 (two) times daily. Reported on 01/24/2016  . rosuvastatin (CRESTOR) 20 MG tablet Take 1 tablet (20 mg total) by mouth at bedtime.  . sitaGLIPtin (JANUVIA) 50 MG tablet TAKE 1 TABLET DAILY  . vitamin B-12 (CYANOCOBALAMIN)  500 MCG tablet Take 500 mcg by mouth daily.    . Vitamin D, Ergocalciferol, (DRISDOL) 1.25 MG (50000 UNIT) CAPS capsule TAKE 1 CAPSULE ONCE A WEEK  . zinc gluconate 50 MG tablet Take 50 mg by mouth daily.     Allergies:   Dimetapp c [phenylephrine-bromphen-codeine], Phenylephrine hcl, Ultram [tramadol hcl], Celebrex [celecoxib], Cortisone, Fenofibrate, and Sulfa antibiotics   Social History   Socioeconomic History  . Marital status: Widowed    Spouse name: Not on file  . Number of children: 1  . Years of education: Not on file  . Highest education level: 12th grade  Occupational History  . Occupation: Retired from [employer] but still  works part-time  Tobacco Use  . Smoking status: Never Smoker  . Smokeless tobacco: Never Used  Substance and Sexual Activity  . Alcohol use: No  . Drug use: No  . Sexual activity: Never  Other Topics Concern  . Not on file  Social History Narrative   Lives with husband, who is a patient of Dr Lovena Le. She is active around the house and exercises regularly.   Social Determinants of Health   Financial Resource Strain: Low Risk   . Difficulty of Paying Living Expenses: Not hard at all  Food Insecurity: No Food Insecurity  . Worried About Charity fundraiser in the Last Year: Never true  . Ran Out of Food in the Last Year: Never true  Transportation Needs: No Transportation Needs  . Lack of Transportation (Medical): No  . Lack of Transportation (Non-Medical): No  Physical Activity: Inactive  . Days of Exercise per Week: 0 days  . Minutes of Exercise per Session: 0 min  Stress: No Stress Concern Present  . Feeling of Stress : Not at all  Social Connections: Slightly Isolated  . Frequency of Communication with Friends and Family: More than three times a week  . Frequency of Social Gatherings with Friends and Family: More than three times a week  . Attends Religious Services: More than 4 times per year  . Active Member of Clubs or Organizations:  Yes  . Attends Archivist Meetings: More than 4 times per year  . Marital Status: Widowed     Family History: The patient's family history includes Cancer in her father; Cancer (age of onset: 38) in her mother; Cancer (age of onset: 9) in her sister; Depression in her mother; Healthy in her daughter; Heart disease in her father; Heart disease (age of onset: 85) in her sister; Hypertension in her sister; Stroke in her father.  ROS:   Please see the history of present illness.     All other systems reviewed and are negative.  EKGs/Labs/Other Studies Reviewed:    The following studies were reviewed today:  EKG:  EKG is ordered today.  The ekg ordered today demonstrates normal sinus rhythm, rate 62, 1 mm ST depressions in V4-6  TTE 07/29/19: 1. Left ventricular ejection fraction, by visual estimation, is 60 to  65%. The left ventricle has normal function. There is borderline left  ventricular hypertrophy.  2. The left ventricle has no regional wall motion abnormalities.  3. Global right ventricle has normal systolic function.The right  ventricular size is normal. No increase in right ventricular wall  thickness.  4. Left atrial size was severely dilated.  5. Right atrial size was normal.  6. Mild mitral annular calcification.  7. The mitral valve is degenerative. Trivial mitral valve regurgitation.  8. The tricuspid valve is grossly normal. Tricuspid valve regurgitation  is not demonstrated.  9. The aortic valve is tricuspid. Aortic valve regurgitation is not  visualized. No evidence of aortic valve sclerosis or stenosis.  10. The pulmonic valve was grossly normal. Pulmonic valve regurgitation is  not visualized.  11. The inferior vena cava is dilated in size with >50% respiratory  variability, suggesting right atrial pressure of 8 mmHg.   Recent Labs: 06/21/2019: TSH 3.190 08/04/2019: B Natriuretic Peptide 291.2 08/07/2019: Magnesium 2.2 08/24/2019: ALT 16;  BUN 8; Creatinine, Ser 0.59; Hemoglobin 10.9; Platelets 238; Potassium 4.1; Sodium 136  Recent Lipid Panel    Component Value Date/Time   CHOL 179 06/21/2019 0943   CHOL  177 02/23/2013 0911   TRIG 57 08/04/2019 1650   TRIG 203 (H) 01/12/2015 0915   TRIG 293 (H) 02/23/2013 0911   HDL 45 06/21/2019 0943   HDL 40 01/12/2015 0915   HDL 48 02/23/2013 0911   CHOLHDL 4.0 06/21/2019 0943   LDLCALC 96 06/21/2019 0943   LDLCALC 119 (H) 10/06/2013 1012   LDLCALC 70 02/23/2013 0911    Physical Exam:    VS:  BP (!) 150/50   Pulse 65   Resp 15   Ht '5\' 3"'  (1.6 m)   Wt 167 lb 3.2 oz (75.8 kg)   SpO2 95%   BMI 29.62 kg/m     Wt Readings from Last 3 Encounters:  11/01/19 167 lb 3.2 oz (75.8 kg)  10/25/19 164 lb (74.4 kg)  08/07/19 166 lb 6.4 oz (75.5 kg)     GEN:in no acute distress HEENT: Normal NECK: No JVD; No carotid bruits LYMPHATICS: No lymphadenopathy CARDIAC: RRR, no murmurs, rubs, gallops RESPIRATORY:  Clear to auscultation without rales, wheezing or rhonchi  ABDOMEN: Soft, non-tender, non-distended MUSCULOSKELETAL:  1+ BLE edema SKIN: Warm and dry NEUROLOGIC:  Alert and oriented x 3 PSYCHIATRIC:  Normal affect   ASSESSMENT:    1. Chest pain, unspecified type   2. Lower extremity edema   3. Essential hypertension   4. Hyperlipidemia, unspecified hyperlipidemia type    PLAN:    Chest pain: typical in description, as describes chest heaviness with exertion.  However, it is difficult to get a full history given patient's aphasia.  Will order [facility] for further evaluation  Lower extremity edema: TTE on 07/29/2019 showed no evidence of structural heart disease.Marland Kitchen  No DVT on lower extremity duplex.  BNP 118.  Hypertension: On amlodipine 5 mg daily, doxazosin 8 mg daily, lisinopril 40 mg daily, Toprol-XL 100 mg twice daily  Hyperlipidemia: On rosuvastatin 20 mg daily.  LDL 88 on 10/29/18  Type 2 diabetes: On Januvia, Metformin.  A1c 7.0   Virtual visit in 1  month to go over [employer] results   Medication Adjustments/Labs and Tests Ordered: Current medicines are reviewed at length with the patient today.  Concerns regarding medicines are outlined above.  Orders Placed This Encounter  Procedures  . MYOCARDIAL PERFUSION IMAGING   No orders of the defined types were placed in this encounter.   Patient Instructions  Medication Instructions:  Your physician recommends that you continue on your current medications as directed. Please refer to the Current Medication list given to you today.  *If you need a refill on your cardiac medications before your next appointment, please call your pharmacy*   Lab Work: NONE  Testing/Procedures: Your physician has requested that you have a lexiscan myoview. For further information please visit [email or web address]. Please follow instruction sheet, as given.   Follow-Up: At [facility], you and your health needs are our priority.  As part of our continuing mission to provide you with exceptional heart care, we have created designated Provider Care Teams.  These Care Teams include your primary Cardiologist (physician) and Advanced Practice Providers (APPs -  Physician Assistants and Nurse Practitioners) who all work together to provide you with the care you need, when you need it.  We recommend signing up for the patient portal called "MyChart".  Sign up information is provided on this After Visit Summary.  MyChart is used to connect with patients for Virtual Visits (Telemedicine).  Patients are able to view lab/test results, encounter notes, upcoming appointments, etc.  Non-urgent  messages can be sent to your provider as well.   To learn more about what you can do with MyChart, go to [email or web address].    Your next appointment:   1 month(s)  The format for your next appointment:   Virtual Visit   Provider:   Oswaldo Milian, MD         Signed, Donato Heinz, MD    11/01/2019 12:49 PM    [place]

## 2019-11-01 ENCOUNTER — Other Ambulatory Visit: Payer: Self-pay

## 2019-11-01 ENCOUNTER — Ambulatory Visit (INDEPENDENT_AMBULATORY_CARE_PROVIDER_SITE_OTHER): Payer: Medicare PPO | Admitting: Cardiology

## 2019-11-01 ENCOUNTER — Encounter: Payer: Self-pay | Admitting: Cardiology

## 2019-11-01 VITALS — BP 150/50 | HR 65 | Resp 15 | Ht 63.0 in | Wt 167.2 lb

## 2019-11-01 DIAGNOSIS — I10 Essential (primary) hypertension: Secondary | ICD-10-CM

## 2019-11-01 DIAGNOSIS — R079 Chest pain, unspecified: Secondary | ICD-10-CM

## 2019-11-01 DIAGNOSIS — R600 Localized edema: Secondary | ICD-10-CM

## 2019-11-01 DIAGNOSIS — E785 Hyperlipidemia, unspecified: Secondary | ICD-10-CM | POA: Diagnosis not present

## 2019-11-01 DIAGNOSIS — J3089 Other allergic rhinitis: Secondary | ICD-10-CM | POA: Diagnosis not present

## 2019-11-01 DIAGNOSIS — J301 Allergic rhinitis due to pollen: Secondary | ICD-10-CM | POA: Diagnosis not present

## 2019-11-01 NOTE — Patient Instructions (Signed)
Medication Instructions:  Your physician recommends that you continue on your current medications as directed. Please refer to the Current Medication list given to you today.  *If you need a refill on your cardiac medications before your next appointment, please call your pharmacy*   Lab Work: NONE  Testing/Procedures: Your physician has requested that you have a lexiscan myoview. For further information please visit [email or web address]. Please follow instruction sheet, as given.   Follow-Up: At [facility], you and your health needs are our priority.  As part of our continuing mission to provide you with exceptional heart care, we have created designated Provider Care Teams.  These Care Teams include your primary Cardiologist (physician) and Advanced Practice Providers (APPs -  Physician Assistants and Nurse Practitioners) who all work together to provide you with the care you need, when you need it.  We recommend signing up for the patient portal called "MyChart".  Sign up information is provided on this After Visit Summary.  MyChart is used to connect with patients for Virtual Visits (Telemedicine).  Patients are able to view lab/test results, encounter notes, upcoming appointments, etc.  Non-urgent messages can be sent to your provider as well.   To learn more about what you can do with MyChart, go to [email or web address].    Your next appointment:   1 month(s)  The format for your next appointment:   Virtual Visit   Provider:   Oswaldo Milian, MD

## 2019-11-03 ENCOUNTER — Ambulatory Visit (INDEPENDENT_AMBULATORY_CARE_PROVIDER_SITE_OTHER): Payer: Medicare PPO

## 2019-11-03 ENCOUNTER — Encounter: Payer: Self-pay | Admitting: Orthopaedic Surgery

## 2019-11-03 ENCOUNTER — Other Ambulatory Visit: Payer: Self-pay

## 2019-11-03 ENCOUNTER — Ambulatory Visit: Payer: Medicare PPO | Admitting: Orthopaedic Surgery

## 2019-11-03 VITALS — Ht 63.0 in | Wt 166.0 lb

## 2019-11-03 DIAGNOSIS — M25512 Pain in left shoulder: Secondary | ICD-10-CM

## 2019-11-03 DIAGNOSIS — G8929 Other chronic pain: Secondary | ICD-10-CM

## 2019-11-03 MED ORDER — METHYLPREDNISOLONE ACETATE 40 MG/ML IJ SUSP
80.0000 mg | INTRAMUSCULAR | Status: AC | PRN
  Administered 2019-11-03: 80 mg via INTRA_ARTICULAR

## 2019-11-03 MED ORDER — LIDOCAINE HCL 2 % IJ SOLN
2.0000 mL | INTRAMUSCULAR | Status: AC | PRN
  Administered 2019-11-03: 2 mL

## 2019-11-03 MED ORDER — BUPIVACAINE HCL 0.5 % IJ SOLN
2.0000 mL | INTRAMUSCULAR | Status: AC | PRN
  Administered 2019-11-03: 2 mL via INTRA_ARTICULAR

## 2019-11-03 NOTE — Progress Notes (Signed)
Office Visit Note   Patient: Kristi Willis           Date of Birth: [birth year]           MRN: [id number] Visit Date: 11/03/2019              Requested by: Sharion Balloon, [address],  [place] [address] PCP: Sharion Balloon, FNP   Assessment & Plan: Visit Diagnoses:  1. Chronic left shoulder pain     Plan: Impingement syndrome left shoulder with mild adhesive capsulitis.  Other possibilities would include a rotator cuff tear.  Kristi Willis has a history of progressive aphasia not related to a stroke.  I am not sure that there shoulder issue is related to the aphasia.  She is accompanied by her daughter who has help with her medical history.  Long discussion regarding different treatment options for the impingement in the adhesive capsulitis.  From a diagnostic and therapeutic standpoint we will try a subacromial cortisone injection and then of course of physical therapy at [facility] in [place].  If no improvement return in about 1 month.  Consider MRI scan to rule out rotator cuff tear  Follow-Up Instructions: Return if symptoms worsen or fail to improve.   Orders:  Orders Placed This Encounter  Procedures  . Large Joint Inj: L subacromial bursa  . XR Shoulder Left  . Ambulatory referral to Physical Therapy   No orders of the defined types were placed in this encounter.     Procedures: Large Joint Inj: L subacromial bursa on 11/03/2019 11:36 AM Indications: pain and diagnostic evaluation Details: 25 G 1.5 in needle, anterolateral approach  Arthrogram: No  Medications: 2 mL lidocaine 2 %; 2 mL bupivacaine 0.5 %; 80 mg methylPREDNISolone acetate 40 MG/ML Consent was given by the patient. Immediately prior to procedure a time out was called to verify the correct patient, procedure, equipment, support staff and site/side marked as required. Patient was prepped and draped in the usual sterile fashion.       Clinical Data: No additional  findings.   Subjective: Chief Complaint  Patient presents with  . Left Shoulder - Pain  Patient presents today for left shoulder pain. No known injury. She states that it started hurting about 24months ago and has worsened in the last two months. Her pain is located all throughout her shoulder and radiates into her proximal arm. Limited range of motion. She has numbness, tingling, and weakness in her left arm. She is right hand dominant. She has been taking Naproxen for pain, as prescribed by her PCP. No previous shoulder surgery. She is diabetic. Kristi Willis has a history of progressive aphasia not related to a stroke.  She is accompanied by her daughter who helps with her medical history.  Her mother is been having difficulty raising her left arm overhead and difficulty sleeping on that side related to the pain.  No numbness or tingling or related neck discomfort.  HPI  Review of Systems   Objective: Vital Signs: Ht 5\' 3"  (1.6 m)   Wt 166 lb (75.3 kg)   BMI 29.41 kg/m   Physical Exam Constitutional:      Appearance: She is well-developed.  Eyes:     Pupils: Pupils are equal, round, and reactive to light.  Pulmonary:     Effort: Pulmonary effort is normal.  Skin:    General: Skin is warm and dry.  Neurological:     Mental  Status: She is alert and oriented to person, place, and time.  Psychiatric:        Behavior: Behavior normal.     Ortho Exam Kristi Willis has expressive aphasia and her daughter helped interpret for her.  She lacks about 30 to 40 degrees of full overhead motion of her left shoulder compared to the right consistent with mild adhesive capsulitis.  There was positive impingement empty can testing.  Appears to have good strength.  Good grip.  Skin intact.  Some pain along the anterior lateral subacromial region.  No pain at the [facility] joint.  No grating or crepitation.  I could passively place the arm overhead but still lacked about 35 degrees of full flexion compared  to the right side Specialty Comments:  No specialty comments available.  Imaging: XR Shoulder Left  Result Date: 11/03/2019 Films of the left shoulder obtained in several projections.  There is a little bit of ectopic calcification along the inferior glenoid which could be an osteophyte.  Humeral head is centered about the glenoid.  Normal space between the humeral head and the acromion.  Some prominence of the greater tuberosity but no evidence of fracture.  Narrowing of the [facility] joint consistent with arthritis and some prominence of the acromium anteriorly.    PMFS History: Patient Active Problem List   Diagnosis Date Noted  . Pain in left shoulder 11/03/2019  . Acute metabolic encephalopathy [id number]  . Type 2 diabetes mellitus ([place]) 08/05/2019  . Anemia 08/05/2019  . QT prolongation 08/05/2019  . Pneumonia 08/04/2019  . Hypothyroidism 02/12/2019  . Speech articulation disorder 09/01/2018  . Essential hypertension 10/03/2017  . GAD (generalized anxiety disorder) 07/03/2017  . Uncontrolled type 2 diabetes mellitus with diabetic neuropathy, without long-term current use of insulin ([place]) 09/27/2016  . Depression 03/29/2016  . Insomnia 07/18/2015  . Vitamin D deficiency 10/11/2013  . Right carotid bruit 03/02/2013  . Degenerative disc disease, lumbar 11/27/2012  . Asthma 11/13/2010  . Hyperlipidemia associated with type 2 diabetes mellitus ([place]) 11/13/2010  . Cancer of skin 11/13/2010  . Diabetic neuropathy ([place]) 11/13/2010  . Wide-complex tachycardia==Atrial Tachycardia 11/13/2010   Past Medical History:  Diagnosis Date  . Allergy   . Asthma   . Cancer ([place])    skin  . Diabetes mellitus   . GERD (gastroesophageal reflux disease)    hiatal hernia  . Hypercholesteremia   . Hypertension   . Neuromuscular disorder (HCC)    DM neuropathy  . Neuropathy   . SVT (supraventricular tachycardia) (HCC)     Family History  Problem Relation Age of Onset  . Cancer Mother 55        cervical cancer  . Depression Mother   . Heart disease Father   . Stroke Father   . Cancer Father        lung cancer  . Heart disease Sister 21       MI  . Hypertension Sister   . Cancer Sister 11       breast cancer  . Healthy Daughter     Past Surgical History:  Procedure Laterality Date  . BREAST BIOPSY     left breast biopsy  . LUMBAR LAMINECTOMY  08/10  . SKIN CANCER EXCISION  2005  . SPINE SURGERY     lumbar laminectomy   Social History   Occupational History  . Occupation: Retired from [employer] but still works part-time  Tobacco Use  . Smoking status: Never Smoker  .  Smokeless tobacco: Never Used  Substance and Sexual Activity  . Alcohol use: No  . Drug use: No  . Sexual activity: Never

## 2019-11-04 ENCOUNTER — Telehealth (HOSPITAL_COMMUNITY): Payer: Self-pay

## 2019-11-04 NOTE — Telephone Encounter (Signed)
Encounter complete. 

## 2019-11-05 ENCOUNTER — Telehealth (HOSPITAL_COMMUNITY): Payer: Self-pay

## 2019-11-05 NOTE — Telephone Encounter (Signed)
Encounter complete. 

## 2019-11-08 ENCOUNTER — Ambulatory Visit: Payer: Medicare PPO | Attending: Orthopaedic Surgery | Admitting: Physical Therapy

## 2019-11-08 ENCOUNTER — Encounter: Payer: Self-pay | Admitting: Physical Therapy

## 2019-11-08 ENCOUNTER — Other Ambulatory Visit: Payer: Self-pay

## 2019-11-08 DIAGNOSIS — G8929 Other chronic pain: Secondary | ICD-10-CM | POA: Diagnosis not present

## 2019-11-08 DIAGNOSIS — R4701 Aphasia: Secondary | ICD-10-CM | POA: Insufficient documentation

## 2019-11-08 DIAGNOSIS — M25512 Pain in left shoulder: Secondary | ICD-10-CM | POA: Diagnosis not present

## 2019-11-08 DIAGNOSIS — R482 Apraxia: Secondary | ICD-10-CM | POA: Diagnosis not present

## 2019-11-08 DIAGNOSIS — M25612 Stiffness of left shoulder, not elsewhere classified: Secondary | ICD-10-CM | POA: Diagnosis not present

## 2019-11-08 NOTE — Therapy (Signed)
[facility] [place], [place], [address] Phone: [phone]   Fax:  [phone]  Physical Therapy Evaluation  Patient Details  Name: Kristi Willis MRN: [id number] Date of Birth: [birth year] Referring Provider (PT): Joni Fears MD   Encounter Date: 11/08/2019  PT End of Session - 11/08/19 1119    Visit Number  1    Number of Visits  12    Date for PT Re-Evaluation  02/07/20    PT Start Time  1030    PT Stop Time  1112    PT Time Calculation (min)  42 min    Activity Tolerance  Patient tolerated treatment well    Behavior During Therapy  [facility] for tasks assessed/performed       Past Medical History:  Diagnosis Date  . Allergy   . Asthma   . Cancer ([place])    skin  . Diabetes mellitus   . GERD (gastroesophageal reflux disease)    hiatal hernia  . Hypercholesteremia   . Hypertension   . Neuromuscular disorder (HCC)    DM neuropathy  . Neuropathy   . SVT (supraventricular tachycardia) (HCC)     Past Surgical History:  Procedure Laterality Date  . BREAST BIOPSY     left breast biopsy  . LUMBAR LAMINECTOMY  08/10  . SKIN CANCER EXCISION  2005  . SPINE SURGERY     lumbar laminectomy    There were no vitals filed for this visit.   Subjective Assessment - 11/08/19 1122    Subjective  COVID-19 screen performed prior to patient entering clinic.  The patient presents to the clinic with c/o chronic left shoulder pain that has worsened over the last several months.  The patient has expressive aphasia but understood what I was saying and followed commands very well.  Her pain today at rest is a 5/10 but rise to higher levels with certain left shoulder movements.  A recent injection was performed.    Pertinent History  Asthma, DM, GERD, HTN, SVT, neuropathy.    Patient Stated Goals  Use arm without pain.    Currently in Pain?  Yes    Pain Score  5     Pain Location  Shoulder    Pain Orientation  Left    Pain  Descriptors / Indicators  Aching;Sore    Pain Type  Chronic pain    Pain Onset  More than a month ago    Pain Frequency  Constant    Aggravating Factors   Left shoulder movement.    Pain Relieving Factors  Rest.         [facility] PT Assessment - 11/08/19 0001      Assessment   Medical Diagnosis  Chronic left shoulder pain.    Referring Provider (PT)  Joni Fears MD    Onset Date/Surgical Date  --   6-8 months.     Precautions   Precautions  None      Restrictions   Weight Bearing Restrictions  No      Balance Screen   Has the patient fallen in the past 6 months  No    Has the patient had a decrease in activity level because of a fear of falling?   No    Is the patient reluctant to leave their home because of a fear of falling?   No      Home Film/video editor residence      Prior  Function   Level of Independence  Independent      Cognition   Overall Cognitive Status  --   Expressive aphasia.  Follows commands well.     Posture/Postural Control   Posture/Postural Control  Postural limitations    Postural Limitations  Rounded Shoulders      Deep Tendon Reflexes   DTR Assessment Site  Biceps;Brachioradialis;Triceps    Biceps DTR  2+    Brachioradialis DTR  2+    Triceps DTR  2+      ROM / Strength   AROM / PROM / Strength  AROM;Strength      AROM   Overall AROM Comments  115 degrees of antigravity flexion, ER= 85 degrees and behind back to lower lumbar region.      Strength   Overall Strength Comments  Flexion and abduction= 4 to 4+/5.  IR/ER= 5/5.      Palpation   Palpation comment  Tender to palpation over left shoulder acromial ridge.      Special Tests   Other special tests  Some pain reproduction with left shoulder Impingement test.                Objective measurements completed on examination: See above findings.      [facility] Adult PT Treatment/Exercise - 11/08/19 0001      Modalities   Modalities  Electrical  Stimulation;Moist Heat      Moist Heat Therapy   Number Minutes Moist Heat  20 Minutes    Moist Heat Location  Shoulder      Electrical Stimulation   Electrical Stimulation Location  Left shoulder.    Electrical Stimulation Action  IFC    Electrical Stimulation Parameters  80-150 Hz x 20 minutes at 100% scan.    Electrical Stimulation Goals  Pain               PT Short Term Goals - 11/08/19 1145      PT SHORT TERM GOAL #1   Title  STG's=LTG's.        PT Long Term Goals - 11/08/19 1145      PT LONG TERM GOAL #1   Title  Independent with a HEP.    Time  6    Period  Weeks    Status  New      PT LONG TERM GOAL #2   Title  Active left shoulder flexion to 145 degrees so the patient can easily reach overhead.    Time  6    Period  Weeks    Status  New      PT LONG TERM GOAL #3   Title  Perform ADL's with pain not > 3/10.    Time  6    Period  Weeks    Status  New             Plan - 11/08/19 1140    Clinical Impression Statement  The patient presents to OPPT with c/o chronic left shoulder pain.  She has some loss of range of motion, most notably flexion.  Her strength is quite good.  She had some pain reproduction with an Impingement test.Patient will benefit from skilled physical therapy intervention to address deficits and pain.    Personal Factors and Comorbidities  Comorbidity 1;Comorbidity 2    Comorbidities  Asthma, DM, GERD, HTN, SVT, neuropathy.    Examination-Activity Limitations  Reach Overhead;Other    Examination-Participation Restrictions  Other    Stability/Clinical Decision Making  Evolving/Moderate complexity    Clinical Decision Making  Low    Rehab Potential  Good    PT Frequency  2x / week    PT Duration  6 weeks    PT Treatment/Interventions  ADLs/Self Care Home Management;Cryotherapy;Electrical Stimulation;Ultrasound;Moist Heat;Iontophoresis 4mg /ml Dexamethasone;Therapeutic activities;Therapeutic exercise;Manual  techniques;Patient/family education;Passive range of motion;Dry needling;Joint Manipulations    PT Next Visit Plan  Left shoulder stretching, pulleys.  Towel stretch.  Combo e'stim/U/S.  STW/M is needed.    Consulted and Agree with Plan of Care  Patient       Patient will benefit from skilled therapeutic intervention in order to improve the following deficits and impairments:  Pain, Decreased activity tolerance, Decreased range of motion, Decreased strength  Visit Diagnosis: Chronic left shoulder pain - Plan: PT plan of care cert/re-cert  Stiffness of left shoulder, not elsewhere classified - Plan: PT plan of care cert/re-cert     Problem List Patient Active Problem List   Diagnosis Date Noted  . Pain in left shoulder 11/03/2019  . Acute metabolic encephalopathy [id number]  . Type 2 diabetes mellitus ([place]) 08/05/2019  . Anemia 08/05/2019  . QT prolongation 08/05/2019  . Pneumonia 08/04/2019  . Hypothyroidism 02/12/2019  . Speech articulation disorder 09/01/2018  . Essential hypertension 10/03/2017  . GAD (generalized anxiety disorder) 07/03/2017  . Uncontrolled type 2 diabetes mellitus with diabetic neuropathy, without long-term current use of insulin ([place]) 09/27/2016  . Depression 03/29/2016  . Insomnia 07/18/2015  . Vitamin D deficiency 10/11/2013  . Right carotid bruit 03/02/2013  . Degenerative disc disease, lumbar 11/27/2012  . Asthma 11/13/2010  . Hyperlipidemia associated with type 2 diabetes mellitus ([place]) 11/13/2010  . Cancer of skin 11/13/2010  . Diabetic neuropathy ([place]) 11/13/2010  . Wide-complex tachycardia==Atrial Tachycardia 11/13/2010    Jolly Bleicher, [place] MPT 11/08/2019, 11:49 AM  [facility] [address] [place], [place], [address] Phone: [phone]   Fax:  [phone]  Name: LOUKISHA KHOSLA MRN: [id number] Date of Birth: Jan 29, 1941

## 2019-11-09 ENCOUNTER — Ambulatory Visit (HOSPITAL_COMMUNITY)
Admission: RE | Admit: 2019-11-09 | Discharge: 2019-11-09 | Disposition: A | Discharge status: Home / Self Care | Payer: Medicare PPO | Source: Ambulatory Visit | Attending: Cardiovascular Disease | Admitting: Cardiovascular Disease

## 2019-11-09 DIAGNOSIS — R079 Chest pain, unspecified: Secondary | ICD-10-CM | POA: Insufficient documentation

## 2019-11-09 MED ORDER — REGADENOSON 0.4 MG/5ML IV SOLN
0.4000 mg | Freq: Once | INTRAVENOUS | Status: AC
  Administered 2019-11-09: 0.4 mg via INTRAVENOUS

## 2019-11-09 MED ORDER — TECHNETIUM TC 99M TETROFOSMIN IV KIT
31.8000 | PACK | Freq: Once | INTRAVENOUS | Status: AC | PRN
  Administered 2019-11-09: 31.8 via INTRAVENOUS
  Filled 2019-11-09: qty 32

## 2019-11-09 MED ORDER — TECHNETIUM TC 99M TETROFOSMIN IV KIT
9.7000 | PACK | Freq: Once | INTRAVENOUS | Status: AC | PRN
  Administered 2019-11-09: 9.7 via INTRAVENOUS
  Filled 2019-11-09: qty 10

## 2019-11-10 ENCOUNTER — Ambulatory Visit: Payer: Medicare PPO | Admitting: Physical Therapy

## 2019-11-10 ENCOUNTER — Other Ambulatory Visit: Payer: Self-pay

## 2019-11-10 DIAGNOSIS — M25612 Stiffness of left shoulder, not elsewhere classified: Secondary | ICD-10-CM

## 2019-11-10 DIAGNOSIS — M25512 Pain in left shoulder: Secondary | ICD-10-CM | POA: Diagnosis not present

## 2019-11-10 DIAGNOSIS — R482 Apraxia: Secondary | ICD-10-CM | POA: Diagnosis not present

## 2019-11-10 DIAGNOSIS — R4701 Aphasia: Secondary | ICD-10-CM | POA: Diagnosis not present

## 2019-11-10 DIAGNOSIS — G8929 Other chronic pain: Secondary | ICD-10-CM

## 2019-11-10 NOTE — Therapy (Signed)
[facility] [place], [place], [address] Phone: [phone]   Fax:  [phone]  Physical Therapy Treatment  Patient Details  Name: Kristi Willis MRN: [id number] Date of Birth: 1941/08/12 Referring Provider (PT): Joni Fears MD   Encounter Date: 11/10/2019  PT End of Session - 11/10/19 1109    Visit Number  2    Number of Visits  12    Date for PT Re-Evaluation  02/07/20    PT Start Time  0945    PT Stop Time  1037    PT Time Calculation (min)  52 min    Activity Tolerance  Patient tolerated treatment well    Behavior During Therapy  [facility] for tasks assessed/performed       Past Medical History:  Diagnosis Date  . Allergy   . Asthma   . Cancer ([place])    skin  . Diabetes mellitus   . GERD (gastroesophageal reflux disease)    hiatal hernia  . Hypercholesteremia   . Hypertension   . Neuromuscular disorder (HCC)    DM neuropathy  . Neuropathy   . SVT (supraventricular tachycardia) (HCC)     Past Surgical History:  Procedure Laterality Date  . BREAST BIOPSY     left breast biopsy  . LUMBAR LAMINECTOMY  08/10  . SKIN CANCER EXCISION  2005  . SPINE SURGERY     lumbar laminectomy    There were no vitals filed for this visit.  Subjective Assessment - 11/10/19 1105    Subjective  COVID-19 screen performed prior to patient entering clinic.  No new complaints.    Pertinent History  Asthma, DM, GERD, HTN, SVT, neuropathy.    Patient Stated Goals  Use arm without pain.    Currently in Pain?  Yes    Pain Score  5     Pain Orientation  Left    Pain Descriptors / Indicators  Aching;Sore    Pain Type  Chronic pain    Pain Onset  More than a month ago                       [facility] Adult PT Treatment/Exercise - 11/10/19 0001      Modalities   Modalities  Electrical Stimulation;Moist Heat;Ultrasound      Moist Heat Therapy   Number Minutes Moist Heat  20 Minutes    Moist Heat Location  --    Left shoulder.     Acupuncturist Location  Left shoulder.    Electrical Stimulation Action  IFC    Electrical Stimulation Parameters  80-150 Hz x 20 minutes.    Electrical Stimulation Goals  Pain      Ultrasound   Ultrasound Location  Left shoulder.    Ultrasound Parameters  U/S at 1.50 W/CM2 x 12 minutes.    Ultrasound Goals  Pain      Manual Therapy   Manual Therapy  Soft tissue mobilization    Manual therapy comments  STW/M x 11 minutes.to patient's affected left shoulder musculature.               PT Short Term Goals - 11/08/19 1145      PT SHORT TERM GOAL #1   Title  STG's=LTG's.        PT Long Term Goals - 11/08/19 1145      PT LONG TERM GOAL #1   Title  Independent with a HEP.  Time  6    Period  Weeks    Status  New      PT LONG TERM GOAL #2   Title  Active left shoulder flexion to 145 degrees so the patient can easily reach overhead.    Time  6    Period  Weeks    Status  New      PT LONG TERM GOAL #3   Title  Perform ADL's with pain not > 3/10.    Time  6    Period  Weeks    Status  New            Plan - 11/10/19 1111    Clinical Impression Statement  Patient did well today.  Felt better after treatment.    Personal Factors and Comorbidities  Comorbidity 1;Comorbidity 2    Comorbidities  Asthma, DM, GERD, HTN, SVT, neuropathy.    Examination-Activity Limitations  Reach Overhead;Other    Examination-Participation Restrictions  Other    Rehab Potential  Good    PT Frequency  2x / week    PT Duration  6 weeks    PT Treatment/Interventions  ADLs/Self Care Home Management;Cryotherapy;Electrical Stimulation;Ultrasound;Moist Heat;Iontophoresis 4mg /ml Dexamethasone;Therapeutic activities;Therapeutic exercise;Manual techniques;Patient/family education;Passive range of motion;Dry needling;Joint Manipulations    PT Next Visit Plan  Left shoulder stretching, pulleys.  Towel stretch.  Combo e'stim/U/S.  STW/M  is needed.    Consulted and Agree with Plan of Care  Patient       Patient will benefit from skilled therapeutic intervention in order to improve the following deficits and impairments:  Pain, Decreased activity tolerance, Decreased range of motion, Decreased strength  Visit Diagnosis: Chronic left shoulder pain  Stiffness of left shoulder, not elsewhere classified     Problem List Patient Active Problem List   Diagnosis Date Noted  . Pain in left shoulder 11/03/2019  . Acute metabolic encephalopathy [id number]  . Type 2 diabetes mellitus ([place]) 08/05/2019  . Anemia 08/05/2019  . QT prolongation 08/05/2019  . Pneumonia 08/04/2019  . Hypothyroidism 02/12/2019  . Speech articulation disorder 09/01/2018  . Essential hypertension 10/03/2017  . GAD (generalized anxiety disorder) 07/03/2017  . Uncontrolled type 2 diabetes mellitus with diabetic neuropathy, without long-term current use of insulin ([place]) 09/27/2016  . Depression 03/29/2016  . Insomnia 07/18/2015  . Vitamin D deficiency 10/11/2013  . Right carotid bruit 03/02/2013  . Degenerative disc disease, lumbar 11/27/2012  . Asthma 11/13/2010  . Hyperlipidemia associated with type 2 diabetes mellitus ([place]) 11/13/2010  . Cancer of skin 11/13/2010  . Diabetic neuropathy ([place]) 11/13/2010  . Wide-complex tachycardia==Atrial Tachycardia 11/13/2010    Tomie Elko, [place] MPT 11/10/2019, 11:13 AM  [facility] [place], [place], [address] Phone: [phone]   Fax:  [phone]  Name: CHASSY NORED MRN: [id number] Date of Birth: 1941/04/21

## 2019-11-11 ENCOUNTER — Telehealth: Payer: Self-pay | Admitting: Neurology

## 2019-11-11 DIAGNOSIS — J301 Allergic rhinitis due to pollen: Secondary | ICD-10-CM | POA: Diagnosis not present

## 2019-11-11 DIAGNOSIS — J3089 Other allergic rhinitis: Secondary | ICD-10-CM | POA: Diagnosis not present

## 2019-11-11 LAB — MYOCARDIAL PERFUSION IMAGING
LV dias vol: 102 mL (ref 46–106)
LV sys vol: 41 mL
Peak HR: 76 {beats}/min
Rest HR: 55 {beats}/min
SDS: 3
SRS: 1
SSS: 4
TID: 1.11

## 2019-11-11 NOTE — Telephone Encounter (Signed)
Daughter is calling in about wanting to know the exact date of dx. This is for her asphasia DX. Thanks!

## 2019-11-11 NOTE — Telephone Encounter (Signed)
If she is asking when Dr. Si Raider did the testing and formally diagnosed it, it was March 03, 2018. Thanks

## 2019-11-11 NOTE — Telephone Encounter (Signed)
Pt daughter called and informed that Dr. Si Raider did the testing and formally diagnosed her mom, it was March 03, 2018

## 2019-11-15 ENCOUNTER — Other Ambulatory Visit: Payer: Self-pay

## 2019-11-15 ENCOUNTER — Ambulatory Visit: Payer: Medicare PPO | Admitting: Physical Therapy

## 2019-11-15 ENCOUNTER — Encounter: Payer: Self-pay | Admitting: Physical Therapy

## 2019-11-15 DIAGNOSIS — R482 Apraxia: Secondary | ICD-10-CM

## 2019-11-15 DIAGNOSIS — R4701 Aphasia: Secondary | ICD-10-CM

## 2019-11-15 DIAGNOSIS — G8929 Other chronic pain: Secondary | ICD-10-CM

## 2019-11-15 DIAGNOSIS — M25512 Pain in left shoulder: Secondary | ICD-10-CM | POA: Diagnosis not present

## 2019-11-15 DIAGNOSIS — M25612 Stiffness of left shoulder, not elsewhere classified: Secondary | ICD-10-CM | POA: Diagnosis not present

## 2019-11-15 NOTE — Therapy (Signed)
[facility] [place], [place], [address] Phone: [phone]   Fax:  [phone]  Physical Therapy Treatment  Patient Details  Name: Kristi Willis MRN: [id number] Date of Birth: September 08, 1940 Referring Provider (PT): Joni Fears MD   Encounter Date: 11/15/2019  PT End of Session - 11/15/19 1021    Visit Number  3    Number of Visits  12    Date for PT Re-Evaluation  02/07/20    PT Start Time  0945    PT Stop Time  1030    PT Time Calculation (min)  45 min    Activity Tolerance  Patient tolerated treatment well    Behavior During Therapy  [facility] for tasks assessed/performed       Past Medical History:  Diagnosis Date  . Allergy   . Asthma   . Cancer ([place])    skin  . Diabetes mellitus   . GERD (gastroesophageal reflux disease)    hiatal hernia  . Hypercholesteremia   . Hypertension   . Neuromuscular disorder (HCC)    DM neuropathy  . Neuropathy   . SVT (supraventricular tachycardia) (HCC)     Past Surgical History:  Procedure Laterality Date  . BREAST BIOPSY     left breast biopsy  . LUMBAR LAMINECTOMY  08/10  . SKIN CANCER EXCISION  2005  . SPINE SURGERY     lumbar laminectomy    There were no vitals filed for this visit.  Subjective Assessment - 11/15/19 0955    Subjective  COVID-19 screen performed prior to patient entering clinic.  No pain at rest, pt reporting pain with reaching across her body toward R shoulder.    Pertinent History  Asthma, DM, GERD, HTN, SVT, neuropathy.    Patient Stated Goals  Use arm without pain.    Currently in Pain?  No/denies    Pain Orientation  Left    Pain Descriptors / Indicators  Aching;Sore    Pain Onset  More than a month ago                       [facility] Adult PT Treatment/Exercise - 11/15/19 0001      Exercises   Exercises  Shoulder      Shoulder Exercises: Seated   Other Seated Exercises  pronation/supination, elbow flexion       Modalities   Modalities  Electrical Stimulation;Moist Heat;Ultrasound      Electrical Stimulation   Electrical Stimulation Location  Left shoulder.    Electrical Stimulation Action  IFC    Electrical Stimulation Parameters  80-150 Hz x 15 minutes    Electrical Stimulation Goals  Pain      Manual Therapy   Manual Therapy  Soft tissue mobilization;Passive ROM    Manual therapy comments  25    Soft tissue mobilization  L shoulder musculature, active trigger point release to L upper and mid trap    Passive ROM  flexion/ER/IR/adduction/abduction               PT Short Term Goals - 11/08/19 1145      PT SHORT TERM GOAL #1   Title  STG's=LTG's.        PT Long Term Goals - 11/15/19 1015      PT LONG TERM GOAL #1   Title  Independent with a HEP.    Time  6    Period  Weeks    Status  On-going  PT LONG TERM GOAL #2   Title  Active left shoulder flexion to 145 degrees so the patient can easily reach overhead.    Baseline  130 degrees passively    Time  6    Status  On-going      PT LONG TERM GOAL #3   Title  Perform ADL's with pain not > 3/10.    Time  6    Period  Weeks    Status  On-going            Plan - 11/15/19 1018    Clinical Impression Statement  Pt tolerating PROM and STM to L shoulder. Pt with PROM 130 degres. Pt still with decreased rotational components and incresaed pain with end ranges. Continue to progess ROM, AAROM,  and functional mobility as pt tolerates.    Personal Factors and Comorbidities  Comorbidity 1;Comorbidity 2    Comorbidities  Asthma, DM, GERD, HTN, SVT, neuropathy.    Examination-Activity Limitations  Reach Overhead;Other    Examination-Participation Restrictions  Other    Stability/Clinical Decision Making  Evolving/Moderate complexity    Rehab Potential  Good    PT Frequency  2x / week    PT Duration  6 weeks    PT Treatment/Interventions  ADLs/Self Care Home Management;Cryotherapy;Electrical Stimulation;Ultrasound;Moist  Heat;Iontophoresis 4mg /ml Dexamethasone;Therapeutic activities;Therapeutic exercise;Manual techniques;Patient/family education;Passive range of motion;Dry needling;Joint Manipulations    PT Next Visit Plan  Left shoulder stretching, pulleys.  Towel stretch.  Combo e'stim/U/S.  STW/M is needed.    Consulted and Agree with Plan of Care  Patient       Patient will benefit from skilled therapeutic intervention in order to improve the following deficits and impairments:  Pain, Decreased activity tolerance, Decreased range of motion, Decreased strength  Visit Diagnosis: Chronic left shoulder pain  Stiffness of left shoulder, not elsewhere classified  Aphasia  Verbal apraxia     Problem List Patient Active Problem List   Diagnosis Date Noted  . Pain in left shoulder 11/03/2019  . Acute metabolic encephalopathy [id number]  . Type 2 diabetes mellitus ([place]) 08/05/2019  . Anemia 08/05/2019  . QT prolongation 08/05/2019  . Pneumonia 08/04/2019  . Hypothyroidism 02/12/2019  . Speech articulation disorder 09/01/2018  . Essential hypertension 10/03/2017  . GAD (generalized anxiety disorder) 07/03/2017  . Uncontrolled type 2 diabetes mellitus with diabetic neuropathy, without long-term current use of insulin ([place]) 09/27/2016  . Depression 03/29/2016  . Insomnia 07/18/2015  . Vitamin D deficiency 10/11/2013  . Right carotid bruit 03/02/2013  . Degenerative disc disease, lumbar 11/27/2012  . Asthma 11/13/2010  . Hyperlipidemia associated with type 2 diabetes mellitus ([place]) 11/13/2010  . Cancer of skin 11/13/2010  . Diabetic neuropathy ([place]) 11/13/2010  . Wide-complex tachycardia==Atrial Tachycardia 11/13/2010    Oretha Caprice, MPT 11/15/2019, 10:22 AM  [facility] [address] [place], [place], [address] Phone: [phone]   Fax:  [phone]  Name: Kristi Willis MRN: [id number] Date of Birth: [birth year]

## 2019-11-17 ENCOUNTER — Other Ambulatory Visit: Payer: Self-pay | Admitting: Family

## 2019-11-17 ENCOUNTER — Ambulatory Visit: Payer: Medicare PPO | Admitting: Physical Therapy

## 2019-11-17 ENCOUNTER — Other Ambulatory Visit: Payer: Self-pay

## 2019-11-17 ENCOUNTER — Encounter: Payer: Self-pay | Admitting: Physical Therapy

## 2019-11-17 DIAGNOSIS — M25512 Pain in left shoulder: Secondary | ICD-10-CM

## 2019-11-17 DIAGNOSIS — R482 Apraxia: Secondary | ICD-10-CM | POA: Diagnosis not present

## 2019-11-17 DIAGNOSIS — R4701 Aphasia: Secondary | ICD-10-CM

## 2019-11-17 DIAGNOSIS — G8929 Other chronic pain: Secondary | ICD-10-CM | POA: Diagnosis not present

## 2019-11-17 DIAGNOSIS — M25612 Stiffness of left shoulder, not elsewhere classified: Secondary | ICD-10-CM | POA: Diagnosis not present

## 2019-11-17 NOTE — Therapy (Signed)
[facility] [place], [place], [address] Phone: [phone]   Fax:  [phone]  Physical Therapy Treatment  Patient Details  Name: Kristi Willis MRN: [id number] Date of Birth: Feb 27, 1941 Referring Provider (PT): Joni Fears MD   Encounter Date: 11/17/2019  PT End of Session - 11/17/19 1018    Visit Number  4    Number of Visits  12    Date for PT Re-Evaluation  02/07/20    PT Start Time  0945    PT Stop Time  1030    PT Time Calculation (min)  45 min    Activity Tolerance  Patient tolerated treatment well    Behavior During Therapy  [facility] for tasks assessed/performed       Past Medical History:  Diagnosis Date  . Allergy   . Asthma   . Cancer ([place])    skin  . Diabetes mellitus   . GERD (gastroesophageal reflux disease)    hiatal hernia  . Hypercholesteremia   . Hypertension   . Neuromuscular disorder (HCC)    DM neuropathy  . Neuropathy   . SVT (supraventricular tachycardia) (HCC)     Past Surgical History:  Procedure Laterality Date  . BREAST BIOPSY     left breast biopsy  . LUMBAR LAMINECTOMY  08/10  . SKIN CANCER EXCISION  2005  . SPINE SURGERY     lumbar laminectomy    There were no vitals filed for this visit.  Subjective Assessment - 11/17/19 0951    Subjective  COVID-19 screen performed prior to patient entering clinic.  Pt still reporting pain with reaching but no pain at rest in her L shoulder.    Pertinent History  Asthma, DM, GERD, HTN, SVT, neuropathy.    Patient Stated Goals  Use arm without pain.    Currently in Pain?  No/denies    Pain Orientation  Left    Pain Onset  More than a month ago                       [facility] Adult PT Treatment/Exercise - 11/17/19 0001      Exercises   Exercises  Shoulder      Shoulder Exercises: Seated   Other Seated Exercises  pronation/supination, elbow flexion    Other Seated Exercises  shoulder flexion, adduction stretch,  upper trap stretch      Modalities   Modalities  Electrical Stimulation;Moist Heat;Ultrasound      Moist Heat Therapy   Number Minutes Moist Heat  15 Minutes    Moist Heat Location  Shoulder      Electrical Stimulation   Electrical Stimulation Location  Left shoulder.    Electrical Stimulation Action  IFC    Electrical Stimulation Parameters  80-150 Hz x 15 minutes, intensity to tolerance    Electrical Stimulation Goals  Tone;Pain      Manual Therapy   Manual Therapy  Soft tissue mobilization;Passive ROM    Manual therapy comments  25    Soft tissue mobilization  L shoulder upper trap and mid trap, cervical paraspinals    Passive ROM  flexion/ER/IR/adduction/abduction               PT Short Term Goals - 11/08/19 1145      PT SHORT TERM GOAL #1   Title  STG's=LTG's.        PT Long Term Goals - 11/17/19 1013      PT LONG TERM  GOAL #1   Title  Independent with a HEP.    Time  6    Period  Weeks    Status  On-going      PT LONG TERM GOAL #2   Title  Active left shoulder flexion to 145 degrees so the patient can easily reach overhead.    Baseline  130 degrees passively    Period  Weeks    Status  On-going      PT LONG TERM GOAL #3   Title  Perform ADL's with pain not > 3/10.    Time  6    Period  Weeks    Status  On-going            Plan - 11/17/19 1014    Clinical Impression Statement  Pt tolreating treatment well, PROM 130 degrees. Pt tolerating more active stretching today for IR and ER. Pt with tightness noted in L upper trap and infraspinatus. Continue to progress toward more AAROM and functional mobility.    Personal Factors and Comorbidities  Comorbidity 1;Comorbidity 2    Comorbidities  Asthma, DM, GERD, HTN, SVT, neuropathy.    Examination-Activity Limitations  Reach Overhead;Other    Examination-Participation Restrictions  Other    Stability/Clinical Decision Making  Evolving/Moderate complexity    Rehab Potential  Good    PT Frequency   2x / week    PT Duration  6 weeks    PT Treatment/Interventions  ADLs/Self Care Home Management;Cryotherapy;Electrical Stimulation;Ultrasound;Moist Heat;Iontophoresis 4mg /ml Dexamethasone;Therapeutic activities;Therapeutic exercise;Manual techniques;Patient/family education;Passive range of motion;Dry needling;Joint Manipulations    PT Next Visit Plan  Left shoulder stretching, pulleys, upper trap stretch, pulleys, STW/M is needed.    Consulted and Agree with Plan of Care  Patient       Patient will benefit from skilled therapeutic intervention in order to improve the following deficits and impairments:  Pain, Decreased activity tolerance, Decreased range of motion, Decreased strength  Visit Diagnosis: Chronic left shoulder pain  Stiffness of left shoulder, not elsewhere classified  Aphasia  Verbal apraxia     Problem List Patient Active Problem List   Diagnosis Date Noted  . Pain in left shoulder 11/03/2019  . Acute metabolic encephalopathy XX123456  . Type 2 diabetes mellitus ([place]) 08/05/2019  . Anemia 08/05/2019  . QT prolongation 08/05/2019  . Pneumonia 08/04/2019  . Hypothyroidism 02/12/2019  . Speech articulation disorder 09/01/2018  . Essential hypertension 10/03/2017  . GAD (generalized anxiety disorder) 07/03/2017  . Uncontrolled type 2 diabetes mellitus with diabetic neuropathy, without long-term current use of insulin ([place]) 09/27/2016  . Depression 03/29/2016  . Insomnia 07/18/2015  . Vitamin D deficiency 10/11/2013  . Right carotid bruit 03/02/2013  . Degenerative disc disease, lumbar 11/27/2012  . Asthma 11/13/2010  . Hyperlipidemia associated with type 2 diabetes mellitus ([place]) 11/13/2010  . Cancer of skin 11/13/2010  . Diabetic neuropathy ([place]) 11/13/2010  . Wide-complex tachycardia==Atrial Tachycardia 11/13/2010    Oretha Caprice, MPT 11/17/2019, 10:18 AM  [facility] [address] [place], [place],  [address] Phone: [phone]   Fax:  [phone]  Name: Kristi Willis MRN: [id number] Date of Birth: 1941-05-31

## 2019-11-18 DIAGNOSIS — J3089 Other allergic rhinitis: Secondary | ICD-10-CM | POA: Diagnosis not present

## 2019-11-18 DIAGNOSIS — J301 Allergic rhinitis due to pollen: Secondary | ICD-10-CM | POA: Diagnosis not present

## 2019-11-22 ENCOUNTER — Encounter: Payer: Self-pay | Admitting: Physical Therapy

## 2019-11-22 ENCOUNTER — Other Ambulatory Visit: Payer: Self-pay

## 2019-11-22 ENCOUNTER — Ambulatory Visit: Payer: Medicare PPO | Attending: Orthopaedic Surgery | Admitting: Physical Therapy

## 2019-11-22 DIAGNOSIS — M25612 Stiffness of left shoulder, not elsewhere classified: Secondary | ICD-10-CM

## 2019-11-22 DIAGNOSIS — M25512 Pain in left shoulder: Secondary | ICD-10-CM | POA: Diagnosis not present

## 2019-11-22 DIAGNOSIS — G8929 Other chronic pain: Secondary | ICD-10-CM | POA: Diagnosis not present

## 2019-11-22 DIAGNOSIS — R1310 Dysphagia, unspecified: Secondary | ICD-10-CM | POA: Diagnosis not present

## 2019-11-22 NOTE — Therapy (Signed)
[facility] [place], [place], [address] Phone: [phone]   Fax:  [phone]  Physical Therapy Treatment  Patient Details  Name: Kristi Willis MRN: [id number] Date of Birth: [birth year] Referring Provider (PT): Joni Fears MD   Encounter Date: 11/22/2019  PT End of Session - 11/22/19 1015    Visit Number  5    Number of Visits  12    Date for PT Re-Evaluation  02/07/20    PT Start Time  0946    PT Stop Time  1026    PT Time Calculation (min)  40 min    Activity Tolerance  Patient tolerated treatment well    Behavior During Therapy  [facility] for tasks assessed/performed       Past Medical History:  Diagnosis Date  . Allergy   . Asthma   . Cancer ([place])    skin  . Diabetes mellitus   . GERD (gastroesophageal reflux disease)    hiatal hernia  . Hypercholesteremia   . Hypertension   . Neuromuscular disorder (HCC)    DM neuropathy  . Neuropathy   . SVT (supraventricular tachycardia) (HCC)     Past Surgical History:  Procedure Laterality Date  . BREAST BIOPSY     left breast biopsy  . LUMBAR LAMINECTOMY  08/10  . SKIN CANCER EXCISION  2005  . SPINE SURGERY     lumbar laminectomy    There were no vitals filed for this visit.  Subjective Assessment - 11/22/19 0949    Subjective  COVID-19 screen performed prior to patient entering clinic.  Pt still reporting pain yet not as bad, no pain at rest only with movement up to 5/10    Pertinent History  Asthma, DM, GERD, HTN, SVT, neuropathy.    Patient Stated Goals  Use arm without pain.    Currently in Pain?  No/denies         [facility] PT Assessment - 11/22/19 0001      ROM / Strength   AROM / PROM / Strength  AROM      AROM   AROM Assessment Site  Shoulder    Right/Left Shoulder  Left    Left Shoulder Flexion  106 Degrees                   [facility] Adult PT Treatment/Exercise - 11/22/19 0001      Shoulder Exercises: Seated   Elevation   AAROM   2x10 with cane for mobility(AAROM)     Moist Heat Therapy   Number Minutes Moist Heat  15 Minutes    Moist Heat Location  Shoulder      Electrical Stimulation   Electrical Stimulation Location  Left shoulder.    Electrical Stimulation Action  IFC    Electrical Stimulation Parameters  80-150hz  x86min    Electrical Stimulation Goals  Tone;Pain      Ultrasound   Ultrasound Location  left shoulder    Ultrasound Parameters  [place] @1 .5w/cm2/50%/ x71min    Ultrasound Goals  Pain      Manual Therapy   Manual Therapy  Soft tissue mobilization;Passive ROM    Soft tissue mobilization  manual STW for L shoulder upper trap and mid trap, cervical paraspinals    Passive ROM  PROM for flexion to improve mobility               PT Short Term Goals - 11/08/19 1145      PT SHORT TERM GOAL #  1   Title  STG's=LTG's.        PT Long Term Goals - 11/22/19 0951      PT LONG TERM GOAL #1   Title  Independent with a HEP.    Time  6    Period  Weeks    Status  On-going      PT LONG TERM GOAL #2   Title  Active left shoulder flexion to 145 degrees so the patient can easily reach overhead.    Baseline  130 degrees passively    Time  6    Period  Weeks    Status  On-going   AROM 106 degrees 11/22/19     PT LONG TERM GOAL #3   Title  Perform ADL's with pain not > 3/10.    Time  6    Period  Weeks    Status  On-going   5/10 11/22/19           Plan - 11/22/19 1015    Clinical Impression Statement  Patient tolerated treatment well today. Patient reported up to 5/10 with overhead movements. Patient had palpable tightness in left UT today with manual STW today. Today started gentle AAROM flexion with cane to improve mobility. Patient continues to have limitation with flexion actively yet able to reach more with AAROM. Goals progressing.    Personal Factors and Comorbidities  Comorbidity 1;Comorbidity 2    Comorbidities  Asthma, DM, GERD, HTN, SVT, neuropathy.     Examination-Activity Limitations  Reach Overhead;Other    Examination-Participation Restrictions  Other    Stability/Clinical Decision Making  Evolving/Moderate complexity    Rehab Potential  Good    PT Frequency  2x / week    PT Duration  6 weeks    PT Treatment/Interventions  ADLs/Self Care Home Management;Cryotherapy;Electrical Stimulation;Ultrasound;Moist Heat;Iontophoresis 4mg /ml Dexamethasone;Therapeutic activities;Therapeutic exercise;Manual techniques;Patient/family education;Passive range of motion;Dry needling;Joint Manipulations    PT Next Visit Plan  cont to progress with AAROM (pulley's and seated UE ranger) STW/M /modalities as needed.    Consulted and Agree with Plan of Care  Patient       Patient will benefit from skilled therapeutic intervention in order to improve the following deficits and impairments:  Pain, Decreased activity tolerance, Decreased range of motion, Decreased strength  Visit Diagnosis: Chronic left shoulder pain  Stiffness of left shoulder, not elsewhere classified     Problem List Patient Active Problem List   Diagnosis Date Noted  . Pain in left shoulder 11/03/2019  . Acute metabolic encephalopathy [id number]  . Type 2 diabetes mellitus ([place]) 08/05/2019  . Anemia 08/05/2019  . QT prolongation 08/05/2019  . Pneumonia 08/04/2019  . Hypothyroidism 02/12/2019  . Speech articulation disorder 09/01/2018  . Essential hypertension 10/03/2017  . GAD (generalized anxiety disorder) 07/03/2017  . Uncontrolled type 2 diabetes mellitus with diabetic neuropathy, without long-term current use of insulin ([place]) 09/27/2016  . Depression 03/29/2016  . Insomnia 07/18/2015  . Vitamin D deficiency 10/11/2013  . Right carotid bruit 03/02/2013  . Degenerative disc disease, lumbar 11/27/2012  . Asthma 11/13/2010  . Hyperlipidemia associated with type 2 diabetes mellitus ([place]) 11/13/2010  . Cancer of skin 11/13/2010  . Diabetic neuropathy ([place]) 11/13/2010  .  Wide-complex tachycardia==Atrial Tachycardia 11/13/2010    Tyah Acord P, PTA 11/22/2019, 10:29 AM  [facility] [place], [place], [address] Phone: [phone]   Fax:  [phone]  Name: NEBULA PORTEN MRN: [id number] Date of Birth: 08/29/1940

## 2019-11-24 ENCOUNTER — Other Ambulatory Visit: Payer: Self-pay

## 2019-11-24 ENCOUNTER — Ambulatory Visit: Payer: Medicare PPO | Admitting: Physical Therapy

## 2019-11-24 DIAGNOSIS — J3089 Other allergic rhinitis: Secondary | ICD-10-CM | POA: Diagnosis not present

## 2019-11-24 DIAGNOSIS — J301 Allergic rhinitis due to pollen: Secondary | ICD-10-CM | POA: Diagnosis not present

## 2019-11-24 DIAGNOSIS — G8929 Other chronic pain: Secondary | ICD-10-CM | POA: Diagnosis not present

## 2019-11-24 DIAGNOSIS — M25512 Pain in left shoulder: Secondary | ICD-10-CM

## 2019-11-24 DIAGNOSIS — M25612 Stiffness of left shoulder, not elsewhere classified: Secondary | ICD-10-CM | POA: Diagnosis not present

## 2019-11-24 DIAGNOSIS — R1310 Dysphagia, unspecified: Secondary | ICD-10-CM | POA: Diagnosis not present

## 2019-11-24 NOTE — Therapy (Signed)
[facility] [place], [place], [address] Phone: [phone]   Fax:  [phone]  Physical Therapy Treatment  Patient Details  Name: Kristi Willis MRN: [id number] Date of Birth: [birth year] Referring Provider (PT): Joni Fears MD   Encounter Date: 11/24/2019  PT End of Session - 11/24/19 1105    Visit Number  6    Number of Visits  12    Date for PT Re-Evaluation  02/07/20    PT Start Time  0945    PT Stop Time  1038    PT Time Calculation (min)  53 min    Activity Tolerance  Patient tolerated treatment well    Behavior During Therapy  [facility] for tasks assessed/performed       Past Medical History:  Diagnosis Date  . Allergy   . Asthma   . Cancer ([place])    skin  . Diabetes mellitus   . GERD (gastroesophageal reflux disease)    hiatal hernia  . Hypercholesteremia   . Hypertension   . Neuromuscular disorder (HCC)    DM neuropathy  . Neuropathy   . SVT (supraventricular tachycardia) (HCC)     Past Surgical History:  Procedure Laterality Date  . BREAST BIOPSY     left breast biopsy  . LUMBAR LAMINECTOMY  08/10  . SKIN CANCER EXCISION  2005  . SPINE SURGERY     lumbar laminectomy    There were no vitals filed for this visit.  Subjective Assessment - 11/24/19 1025    Subjective  COVID-19 screen performed prior to patient entering clinic.  Very little pain today.    Pertinent History  Asthma, DM, GERD, HTN, SVT, neuropathy.    Patient Stated Goals  Use arm without pain.    Pain Score  2     Pain Descriptors / Indicators  Aching;Sore    Pain Type  Chronic pain    Pain Onset  More than a month ago                       [facility] Adult PT Treatment/Exercise - 11/24/19 0001      Modalities   Modalities  Electrical Stimulation;Moist Heat;Ultrasound      Moist Heat Therapy   Number Minutes Moist Heat  20 Minutes    Moist Heat Location  --   Left shoulder.     Acupuncturist Location  Left shoulder.    Electrical Stimulation Action  IFC    Electrical Stimulation Parameters  80-150 Hz x 20 minutes.    Electrical Stimulation Goals  Tone;Pain      Ultrasound   Ultrasound Location  Left shoulder.    Ultrasound Parameters  Combo e'stim/US at 1.50 W/CM2 x 10 minutes.    Ultrasound Goals  Pain      Manual Therapy   Manual Therapy  Soft tissue mobilization;Passive ROM    Soft tissue mobilization  STW/M x 8 minutes to patient affected left shoulder musculature to decrease pain and tone.    Passive ROM  PROM with focus on left shoulder flexion x 5 minutes.               PT Short Term Goals - 11/08/19 1145      PT SHORT TERM GOAL #1   Title  STG's=LTG's.        PT Long Term Goals - 11/22/19 0951      PT LONG TERM GOAL #1  Title  Independent with a HEP.    Time  6    Period  Weeks    Status  On-going      PT LONG TERM GOAL #2   Title  Active left shoulder flexion to 145 degrees so the patient can easily reach overhead.    Baseline  130 degrees passively    Time  6    Period  Weeks    Status  On-going   AROM 106 degrees 11/22/19     PT LONG TERM GOAL #3   Title  Perform ADL's with pain not > 3/10.    Time  6    Period  Weeks    Status  On-going   5/10 11/22/19             Patient will benefit from skilled therapeutic intervention in order to improve the following deficits and impairments:     Visit Diagnosis: Chronic left shoulder pain  Stiffness of left shoulder, not elsewhere classified     Problem List Patient Active Problem List   Diagnosis Date Noted  . Pain in left shoulder 11/03/2019  . Acute metabolic encephalopathy XX123456  . Type 2 diabetes mellitus ([place]) 08/05/2019  . Anemia 08/05/2019  . QT prolongation 08/05/2019  . Pneumonia 08/04/2019  . Hypothyroidism 02/12/2019  . Speech articulation disorder 09/01/2018  . Essential hypertension 10/03/2017  . GAD (generalized anxiety  disorder) 07/03/2017  . Uncontrolled type 2 diabetes mellitus with diabetic neuropathy, without long-term current use of insulin ([place]) 09/27/2016  . Depression 03/29/2016  . Insomnia 07/18/2015  . Vitamin D deficiency 10/11/2013  . Right carotid bruit 03/02/2013  . Degenerative disc disease, lumbar 11/27/2012  . Asthma 11/13/2010  . Hyperlipidemia associated with type 2 diabetes mellitus ([place]) 11/13/2010  . Cancer of skin 11/13/2010  . Diabetic neuropathy ([place]) 11/13/2010  . Wide-complex tachycardia==Atrial Tachycardia 11/13/2010    Jurni Cesaro, [place] MPT 11/24/2019, 11:13 AM  [facility] [place], [place], [address] Phone: [phone]   Fax:  [phone]  Name: JORDANA FISHBEIN MRN: [id number] Date of Birth: 02/12/1941

## 2019-11-29 ENCOUNTER — Ambulatory Visit: Payer: Medicare PPO | Admitting: Physical Therapy

## 2019-11-29 ENCOUNTER — Other Ambulatory Visit: Payer: Self-pay

## 2019-11-29 DIAGNOSIS — M25512 Pain in left shoulder: Secondary | ICD-10-CM

## 2019-11-29 DIAGNOSIS — M25612 Stiffness of left shoulder, not elsewhere classified: Secondary | ICD-10-CM | POA: Diagnosis not present

## 2019-11-29 DIAGNOSIS — R1310 Dysphagia, unspecified: Secondary | ICD-10-CM | POA: Diagnosis not present

## 2019-11-29 DIAGNOSIS — G8929 Other chronic pain: Secondary | ICD-10-CM

## 2019-11-29 NOTE — Therapy (Signed)
[facility] [place], [place], [address] Phone: [phone]   Fax:  [phone]  Physical Therapy Treatment  Patient Details  Name: Kristi Willis MRN: [id number] Date of Birth: 01/14/1941 Referring Provider (PT): Joni Fears MD   Encounter Date: 11/29/2019  PT End of Session - 11/29/19 1031    Visit Number  7    Number of Visits  12    Date for PT Re-Evaluation  02/07/20    PT Start Time  0944    PT Stop Time  1032    PT Time Calculation (min)  48 min    Activity Tolerance  Patient tolerated treatment well    Behavior During Therapy  [facility] for tasks assessed/performed       Past Medical History:  Diagnosis Date  . Allergy   . Asthma   . Cancer ([place])    skin  . Diabetes mellitus   . GERD (gastroesophageal reflux disease)    hiatal hernia  . Hypercholesteremia   . Hypertension   . Neuromuscular disorder (HCC)    DM neuropathy  . Neuropathy   . SVT (supraventricular tachycardia) (HCC)     Past Surgical History:  Procedure Laterality Date  . BREAST BIOPSY     left breast biopsy  . LUMBAR LAMINECTOMY  08/10  . SKIN CANCER EXCISION  2005  . SPINE SURGERY     lumbar laminectomy    There were no vitals filed for this visit.  Subjective Assessment - 11/29/19 1033    Subjective  COVID-19 screen performed prior to patient entering clinic. Doing good.    Pertinent History  Asthma, DM, GERD, HTN, SVT, neuropathy.    Currently in Pain?  Yes    Pain Score  2     Pain Location  Shoulder    Pain Orientation  Left    Pain Descriptors / Indicators  Aching;Sore    Pain Type  Chronic pain    Pain Onset  More than a month ago         [facility] PT Assessment - 11/29/19 0001      AROM   Left Shoulder Flexion  130 Degrees                   [facility] Adult PT Treatment/Exercise - 11/29/19 0001      Modalities   Modalities  Electrical Stimulation;Moist Heat;Ultrasound      Moist Heat Therapy   Number Minutes Moist Heat  20 Minutes    Moist Heat Location  --   Left shoulder.     Acupuncturist Location  Left shoulder.    Electrical Stimulation Action  IFC    Electrical Stimulation Parameters  80-150 Hz x 20 minutes.    Electrical Stimulation Goals  Tone;Pain      Ultrasound   Ultrasound Location  Left shoulder.    Ultrasound Parameters  Combo e'stim/U/S at 1.50 W/CM2 x 7 minutes.    Ultrasound Goals  Pain      Manual Therapy   Manual Therapy  Soft tissue mobilization;Passive ROM    Soft tissue mobilization  STW/M x 7 minutes to reduce pain and tone to affected left shoulder.    Passive ROM  PROM with focus on left shoulder flexion x 6 minutes.               PT Short Term Goals - 11/08/19 1145      PT SHORT TERM GOAL #1  Title  STG's=LTG's.        PT Long Term Goals - 11/22/19 0951      PT LONG TERM GOAL #1   Title  Independent with a HEP.    Time  6    Period  Weeks    Status  On-going      PT LONG TERM GOAL #2   Title  Active left shoulder flexion to 145 degrees so the patient can easily reach overhead.    Baseline  130 degrees passively    Time  6    Period  Weeks    Status  On-going   AROM 106 degrees 11/22/19     PT LONG TERM GOAL #3   Title  Perform ADL's with pain not > 3/10.    Time  6    Period  Weeks    Status  On-going   5/10 11/22/19           Plan - 11/29/19 1100    Clinical Impression Statement  The patient id doing very well.  She is reporting a low left shoulder pain-level.  Her active left shoulder antigravity flexion has improved to 130 degrees.    Personal Factors and Comorbidities  Comorbidity 1;Comorbidity 2    Comorbidities  Asthma, DM, GERD, HTN, SVT, neuropathy.    Examination-Activity Limitations  Reach Overhead;Other    Examination-Participation Restrictions  Other    Stability/Clinical Decision Making  Evolving/Moderate complexity    Rehab Potential  Good    PT Frequency  2x /  week    PT Duration  6 weeks    PT Treatment/Interventions  ADLs/Self Care Home Management;Cryotherapy;Electrical Stimulation;Ultrasound;Moist Heat;Iontophoresis 4mg /ml Dexamethasone;Therapeutic activities;Therapeutic exercise;Manual techniques;Patient/family education;Passive range of motion;Dry needling;Joint Manipulations    PT Next Visit Plan  cont to progress with AAROM (pulley's and seated UE ranger) STW/M /modalities as needed.    Consulted and Agree with Plan of Care  Patient       Patient will benefit from skilled therapeutic intervention in order to improve the following deficits and impairments:  Pain, Decreased activity tolerance, Decreased range of motion, Decreased strength  Visit Diagnosis: Chronic left shoulder pain  Stiffness of left shoulder, not elsewhere classified     Problem List Patient Active Problem List   Diagnosis Date Noted  . Pain in left shoulder 11/03/2019  . Acute metabolic encephalopathy XX123456  . Type 2 diabetes mellitus ([place]) 08/05/2019  . Anemia 08/05/2019  . QT prolongation 08/05/2019  . Pneumonia 08/04/2019  . Hypothyroidism 02/12/2019  . Speech articulation disorder 09/01/2018  . Essential hypertension 10/03/2017  . GAD (generalized anxiety disorder) 07/03/2017  . Uncontrolled type 2 diabetes mellitus with diabetic neuropathy, without long-term current use of insulin ([place]) 09/27/2016  . Depression 03/29/2016  . Insomnia 07/18/2015  . Vitamin D deficiency 10/11/2013  . Right carotid bruit 03/02/2013  . Degenerative disc disease, lumbar 11/27/2012  . Asthma 11/13/2010  . Hyperlipidemia associated with type 2 diabetes mellitus ([place]) 11/13/2010  . Cancer of skin 11/13/2010  . Diabetic neuropathy ([place]) 11/13/2010  . Wide-complex tachycardia==Atrial Tachycardia 11/13/2010    Chauntay Paszkiewicz, [place] MPT 11/29/2019, 11:14 AM  [facility] [place], [place], [address] Phone: [phone]    Fax:  [phone]  Name: ODA BREER MRN: [id number] Date of Birth: April 25, 1941

## 2019-11-29 NOTE — Progress Notes (Signed)
Virtual Visit via Telephone Note   This visit type was conducted due to national recommendations for restrictions regarding the COVID-19 Pandemic (e.g. social distancing) in an effort to limit this patient's exposure and mitigate transmission in our community.  Due to her co-morbid illnesses, this patient is at least at moderate risk for complications without adequate follow up.  This format is felt to be most appropriate for this patient at this time.  The patient did not have access to video technology/had technical difficulties with video requiring transitioning to audio format only (telephone).  All issues noted in this document were discussed and addressed.  No physical exam could be performed with this format.  Please refer to the patient's chart for her  consent to telehealth for [facility].   Date:  12/02/2019   ID:  Kristi Willis, DOB 1941-01-27, MRN [id number]  Patient Location: Home Provider Location: Office  PCP:  Sharion Balloon, FNP  Cardiologist:  No primary care provider on file.  Electrophysiologist:  None   Evaluation Performed:  Follow-Up Visit  Chief Complaint:  Chest pain  History of Present Illness:    Kristi Willis is a 79 y.o. female with a hx of type 2 diabetes, hypertension, hyperlipidemia, atrial tachycardia, aphasia, asthma who presents for follow-up.  She was referred by Kristi Dun, FNP for evaluation of chest pain, initially seen on 07/28/2019.  History is partially provided by her daughter given patient's aphasia.  On 12/1, daughter reports that patient was walking outside and came inside reporting shortness of breath and heaviness in her chest.  She went to the ED. Unfortunately had long wait in the ED so left without being seen.  Troponins were drawn, which were negative x2.  Reports that she has had chest heaviness intermittently since that time.  Lasts a few minutes and resolves.  Also has been having lower extremity edema recently.  Never  smoker.  Sister had MI around age 14.  Father had MI in 62s.  Previously followed with Dr. Caryl Comes for atrial tachycardia.  Would feel like heart is racing.  Reports has been well controlled on Toprol-XL 100 mg twice daily.    TTE was done on 07/29/2019, which shows EF 60 to 65%, normal RV function, severe left atrial dilatation, no significant valvular disease.  Lower extremity duplex was done to evaluate lower extremity edema, which showed no evidence of DVT.  Lexiscan Myoview on 11/11/2019 showed small fixed mid anteroseptal/apical anterior/apex perfusion defect, likely representing artifact given normal wall motion in that area.  Since last clinic visit, patient reports that she is doing well.  Denies any further chest pain.  Denies any dyspnea.   Past Medical History:  Diagnosis Date  . Allergy   . Asthma   . Cancer ([place])    skin  . Diabetes mellitus   . GERD (gastroesophageal reflux disease)    hiatal hernia  . Hypercholesteremia   . Hypertension   . Neuromuscular disorder (HCC)    DM neuropathy  . Neuropathy   . SVT (supraventricular tachycardia) (HCC)    Past Surgical History:  Procedure Laterality Date  . BREAST BIOPSY     left breast biopsy  . LUMBAR LAMINECTOMY  08/10  . SKIN CANCER EXCISION  2005  . SPINE SURGERY     lumbar laminectomy     Current Meds  Medication Sig  . Accu-Chek Softclix Lancets lancets Check sugars daily & as needed Dx E11.9  . acetaminophen (TYLENOL) 325 MG tablet  Take 2 tablets (650 mg total) by mouth every 6 (six) hours as needed for mild pain or fever (or Fever >/= 101).  Marland Kitchen acyclovir ointment (ZOVIRAX) 5 % APPLY TO THE AFFECTED AREA AROUND MOUTH EVERY 3 HOURS AS DIRECTED UNTIL CLEAR (Patient taking differently: Apply 1 application topically every 3 (three) hours. Using as needed for cold sores)  . albuterol (VENTOLIN HFA) 108 (90 BASE) MCG/ACT inhaler Inhale 2 puffs into the lungs every 6 (six) hours as needed for wheezing.  Marland Kitchen amLODipine  (NORVASC) 5 MG tablet TAKE 1 TABLET DAILY.  Marland Kitchen aspirin 81 MG chewable tablet Chew 81 mg by mouth daily.  Marland Kitchen azelastine (ASTELIN) 0.1 % nasal spray Place 2 sprays into both nostrils 2 (two) times daily.  . Blood Glucose Monitoring Suppl (ACCU-CHEK AVIVA PLUS) w/Device KIT Check sugars daily & as needed Dx E11.9  . Calcium Carbonate-Vitamin D (CALTRATE 600+D) 600-400 MG-UNIT per tablet Take 1 tablet by mouth 2 (two) times daily.  Marland Kitchen doxazosin (CARDURA) 8 MG tablet TAKE (1) TABLET DAILY FOR HIGH BLOOD PRESSURE. (Patient taking differently: Take 8 mg by mouth daily. )  . EPIPEN 2-PAK 0.3 MG/0.3ML SOAJ injection Inject 0.3 mg into the muscle as needed for anaphylaxis. Reported on 01/24/2016  . escitalopram (LEXAPRO) 20 MG tablet Take 1 tablet (20 mg total) by mouth daily.  Marland Kitchen gabapentin (NEURONTIN) 100 MG capsule Take 1 capsule (100 mg total) 3 (three) times daily by mouth.  Marland Kitchen glucose blood (ACCU-CHEK AVIVA PLUS) test strip Check sugars daily & as needed Dx E11.9  . ipratropium-albuterol (DUONEB) 0.5-2.5 (3) MG/3ML SOLN Take 3 mLs by nebulization 2 (two) times daily.  Marland Kitchen levothyroxine (SYNTHROID) 75 MCG tablet Take 1 tablet (75 mcg total) by mouth daily before breakfast.  . lisinopril (ZESTRIL) 40 MG tablet TAKE 1 TABLET DAILY  . loratadine (CLARITIN) 10 MG tablet Take 1 tablet (10 mg total) by mouth daily.  Marland Kitchen menthol-cetylpyridinium (CEPACOL) 3 MG lozenge Take 1 lozenge (3 mg total) by mouth as needed for sore throat.  . metFORMIN (GLUCOPHAGE) 1000 MG tablet TAKE  (1)  TABLET TWICE A DAY WITH MEALS (BREAKFAST AND SUPPER)  . metoprolol succinate (TOPROL-XL) 100 MG 24 hr tablet TAKE (1) TABLET TWICE A DAY.  . montelukast (SINGULAIR) 10 MG tablet Take 1 tablet (10 mg total) by mouth at bedtime.  . Multiple Vitamin (MULTIVITAMIN) tablet Take 1 tablet by mouth daily.    . naproxen (NAPROSYN) 500 MG tablet Take 1 tablet (500 mg total) by mouth 2 (two) times daily with a meal.  . Omega-3 Fatty Acids (FISH OIL)  1200 MG CAPS Take 1 capsule by mouth daily.   . permethrin (ELIMITE) 5 % cream Apply 1 application topically at bedtime. Apply form the chin down on days 1, 2, and 7 then wash upon waking up.  . Pyridoxine HCl (VITAMIN B-6 PO) Take 1 tablet by mouth 2 (two) times daily. Reported on 01/24/2016  . rosuvastatin (CRESTOR) 20 MG tablet Take 1 tablet (20 mg total) by mouth at bedtime.  . sitaGLIPtin (JANUVIA) 50 MG tablet TAKE 1 TABLET DAILY  . vitamin B-12 (CYANOCOBALAMIN) 500 MCG tablet Take 500 mcg by mouth daily.    . Vitamin D, Ergocalciferol, (DRISDOL) 1.25 MG (50000 UNIT) CAPS capsule TAKE 1 CAPSULE ONCE A WEEK  . zinc gluconate 50 MG tablet Take 50 mg by mouth daily.     Allergies:   Dimetapp c [phenylephrine-bromphen-codeine], Phenylephrine hcl, Ultram [tramadol hcl], Celebrex [celecoxib], Cortisone, Fenofibrate, and Sulfa antibiotics  Social History   Tobacco Use  . Smoking status: Never Smoker  . Smokeless tobacco: Never Used  Substance Use Topics  . Alcohol use: No  . Drug use: No     Family Hx: The patient's family history includes Cancer in her father; Cancer (age of onset: 55) in her mother; Cancer (age of onset: 78) in her sister; Depression in her mother; Healthy in her daughter; Heart disease in her father; Heart disease (age of onset: 37) in her sister; Hypertension in her sister; Stroke in her father.  ROS:   Please see the history of present illness.     All other systems reviewed and are negative.   Prior CV studies:   The following studies were reviewed today:  Lexiscan Myoview 11/11/2019:  <62m ST depressions with stress  Nuclear stress EF: 60%.  Defect 1: There is a fixed small defect of mild severity present in the mid anteroseptal, apical anterior, and apex location.  This is a low risk study.  The study is normal.   Fixed small defect of mild severity present in the mid anteroseptal, apical anterior, and apex location.  Given normal wall motion in  this area, suspect artifact  Labs/Other Tests and Data Reviewed:    EKG:  No ECG reviewed.  Recent Labs: 06/21/2019: TSH 3.190 08/04/2019: B Natriuretic Peptide 291.2 08/07/2019: Magnesium 2.2 08/24/2019: ALT 16; BUN 8; Creatinine, Ser 0.59; Hemoglobin 10.9; Platelets 238; Potassium 4.1; Sodium 136   Recent Lipid Panel Lab Results  Component Value Date/Time   CHOL 179 06/21/2019 09:43 AM   CHOL 177 02/23/2013 09:11 AM   TRIG 57 08/04/2019 04:50 PM   TRIG 203 (H) 01/12/2015 09:15 AM   TRIG 293 (H) 02/23/2013 09:11 AM   HDL 45 06/21/2019 09:43 AM   HDL 40 01/12/2015 09:15 AM   HDL 48 02/23/2013 09:11 AM   CHOLHDL 4.0 06/21/2019 09:43 AM   LDLCALC 96 06/21/2019 09:43 AM   LDLCALC 119 (H) 10/06/2013 10:12 AM   LDLCALC 70 02/23/2013 09:11 AM    Wt Readings from Last 3 Encounters:  12/02/19 160 lb (72.6 kg)  11/09/19 166 lb (75.3 kg)  11/03/19 166 lb (75.3 kg)     Objective:    Vital Signs:  Ht _0  (1.6 m)   Wt 160 lb (72.6 kg)   BMI 28.34 kg/m  BP 159/65.  P 69.  VITAL SIGNS:  reviewed  ASSESSMENT & PLAN:    Chest pain: typical in description, as describes chest heaviness with exertion.  However, it is difficult to get a full history given patient's aphasia.    Lexiscan Myoview shows no evidence of ischemia.  Reports no recent chest pain, suspect may have been related to episode of pneumonia.  No further cardiac work-up recommended  Lower extremity edema: TTE on 07/29/2019 showed no evidence of structural heart disease..Marland Kitchen No DVT on lower extremity duplex.  BNP 118.  Hypertension: On amlodipine 5 mg daily, doxazosin 8 mg daily, lisinopril 40 mg daily, Toprol-XL 100 mg twice daily.  BP elevated today, will ask patient to check BP daily for next week and call with results.  Hyperlipidemia: On rosuvastatin 20 mg daily.  LDL 88 on 10/29/18  Type 2 diabetes: On Januvia, Metformin.  A1c 7.0  RTC in 1 year   Time:   Today, I have spent 5 minutes with the patient with  telehealth technology discussing the above problems.     Medication Adjustments/Labs and Tests Ordered: Current medicines are reviewed  at length with the patient today.  Concerns regarding medicines are outlined above.   Tests Ordered: No orders of the defined types were placed in this encounter.   Medication Changes: No orders of the defined types were placed in this encounter.   Follow Up:  In Person in 1 year(s)  Signed, Donato Heinz, MD  12/02/2019 6:05 PM    [place]

## 2019-12-01 ENCOUNTER — Ambulatory Visit: Payer: Medicare PPO | Admitting: Physical Therapy

## 2019-12-01 ENCOUNTER — Encounter: Payer: Self-pay | Admitting: Physical Therapy

## 2019-12-01 ENCOUNTER — Other Ambulatory Visit: Payer: Self-pay

## 2019-12-01 DIAGNOSIS — R1310 Dysphagia, unspecified: Secondary | ICD-10-CM | POA: Diagnosis not present

## 2019-12-01 DIAGNOSIS — G8929 Other chronic pain: Secondary | ICD-10-CM

## 2019-12-01 DIAGNOSIS — M25512 Pain in left shoulder: Secondary | ICD-10-CM | POA: Diagnosis not present

## 2019-12-01 DIAGNOSIS — M25612 Stiffness of left shoulder, not elsewhere classified: Secondary | ICD-10-CM

## 2019-12-01 NOTE — Therapy (Signed)
[facility] [place], [place], [address] Phone: [phone]   Fax:  [phone]  Physical Therapy Treatment  Patient Details  Name: Kristi Willis MRN: [id number] Date of Birth: 08/08/1941 Referring Provider (PT): Joni Fears MD   Encounter Date: 12/01/2019  PT End of Session - 12/01/19 1032    Visit Number  8    Number of Visits  12    Date for PT Re-Evaluation  02/07/20    PT Start Time  1032    PT Stop Time  1113    PT Time Calculation (min)  41 min    Activity Tolerance  Patient tolerated treatment well    Behavior During Therapy  [facility] for tasks assessed/performed       Past Medical History:  Diagnosis Date  . Allergy   . Asthma   . Cancer ([place])    skin  . Diabetes mellitus   . GERD (gastroesophageal reflux disease)    hiatal hernia  . Hypercholesteremia   . Hypertension   . Neuromuscular disorder (HCC)    DM neuropathy  . Neuropathy   . SVT (supraventricular tachycardia) (HCC)     Past Surgical History:  Procedure Laterality Date  . BREAST BIOPSY     left breast biopsy  . LUMBAR LAMINECTOMY  08/10  . SKIN CANCER EXCISION  2005  . SPINE SURGERY     lumbar laminectomy    There were no vitals filed for this visit.  Subjective Assessment - 12/01/19 1032    Subjective  COVID-19 screen performed prior to patient entering clinic. Doing good.    Pertinent History  Asthma, DM, GERD, HTN, SVT, neuropathy.    Patient Stated Goals  Use arm without pain.    Currently in Pain?  No/denies         [facility] PT Assessment - 12/01/19 0001      Assessment   Medical Diagnosis  Chronic left shoulder pain.    Referring Provider (PT)  Joni Fears MD      Precautions   Precautions  None      Restrictions   Weight Bearing Restrictions  No                   [facility] Adult PT Treatment/Exercise - 12/01/19 0001      Modalities   Modalities  Electrical Stimulation;Moist Heat;Ultrasound      Moist Heat Therapy   Number Minutes Moist Heat  15 Minutes    Moist Heat Location  Shoulder      Electrical Stimulation   Electrical Stimulation Location  L shoulder    Electrical Stimulation Action  IFC    Electrical Stimulation Parameters  80-150 hz x15 min    Electrical Stimulation Goals  Pain      Ultrasound   Ultrasound Location  L proximal shoulder    Ultrasound Parameters  Combo 1.5 w/cm2, 100%, 1 mhz x10 min    Ultrasound Goals  Pain      Manual Therapy   Manual Therapy  Soft tissue mobilization;Passive ROM    Soft tissue mobilization  IASTW to L deltoids, proximal bicep    Passive ROM  PROM of R shoulder in sitting into flexion, ER                PT Short Term Goals - 11/08/19 1145      PT SHORT TERM GOAL #1   Title  STG's=LTG's.        PT Long  Term Goals - 11/22/19 0951      PT LONG TERM GOAL #1   Title  Independent with a HEP.    Time  6    Period  Weeks    Status  On-going      PT LONG TERM GOAL #2   Title  Active left shoulder flexion to 145 degrees so the patient can easily reach overhead.    Baseline  130 degrees passively    Time  6    Period  Weeks    Status  On-going   AROM 106 degrees 11/22/19     PT LONG TERM GOAL #3   Title  Perform ADL's with pain not > 3/10.    Time  6    Period  Weeks    Status  On-going   5/10 11/22/19           Plan - 12/01/19 1129    Clinical Impression Statement  Patient presented in clinic with continued tenderness of L proximal shoulder but no pain reported upon arrival. Patient reported more tenderness along L deltoids, proximal bicep region. Minimal redness response to treated L shoulder musculature. Firm end feels and smooth arc of mtoion noted during PROM of L shoulder in sitting. No pain reported by patient during PROM session. Normal modalities response noted following removal of the modalities.    Personal Factors and Comorbidities  Comorbidity 1;Comorbidity 2    Comorbidities  Asthma, DM,  GERD, HTN, SVT, neuropathy.    Examination-Activity Limitations  Reach Overhead;Other    Examination-Participation Restrictions  Other    Stability/Clinical Decision Making  Evolving/Moderate complexity    Rehab Potential  Good    PT Frequency  2x / week    PT Duration  6 weeks    PT Treatment/Interventions  ADLs/Self Care Home Management;Cryotherapy;Electrical Stimulation;Ultrasound;Moist Heat;Iontophoresis 4mg /ml Dexamethasone;Therapeutic activities;Therapeutic exercise;Manual techniques;Patient/family education;Passive range of motion;Dry needling;Joint Manipulations    PT Next Visit Plan  cont to progress with AAROM (pulley's and seated UE ranger) STW/M /modalities as needed.    Consulted and Agree with Plan of Care  Patient       Patient will benefit from skilled therapeutic intervention in order to improve the following deficits and impairments:  Pain, Decreased activity tolerance, Decreased range of motion, Decreased strength  Visit Diagnosis: Chronic left shoulder pain  Stiffness of left shoulder, not elsewhere classified     Problem List Patient Active Problem List   Diagnosis Date Noted  . Pain in left shoulder 11/03/2019  . Acute metabolic encephalopathy [id number]  . Type 2 diabetes mellitus ([place]) 08/05/2019  . Anemia 08/05/2019  . QT prolongation 08/05/2019  . Pneumonia 08/04/2019  . Hypothyroidism 02/12/2019  . Speech articulation disorder 09/01/2018  . Essential hypertension 10/03/2017  . GAD (generalized anxiety disorder) 07/03/2017  . Uncontrolled type 2 diabetes mellitus with diabetic neuropathy, without long-term current use of insulin ([place]) 09/27/2016  . Depression 03/29/2016  . Insomnia 07/18/2015  . Vitamin D deficiency 10/11/2013  . Right carotid bruit 03/02/2013  . Degenerative disc disease, lumbar 11/27/2012  . Asthma 11/13/2010  . Hyperlipidemia associated with type 2 diabetes mellitus ([place]) 11/13/2010  . Cancer of skin 11/13/2010  . Diabetic  neuropathy ([place]) 11/13/2010  . Wide-complex tachycardia==Atrial Tachycardia 11/13/2010    Standley Brooking, PTA 12/01/2019, 11:32 AM  [facility] [address] [place], [place], [address] Phone: [phone]   Fax:  [phone]  Name: Kristi Willis MRN: [id number] Date of Birth: 1941-06-01

## 2019-12-02 ENCOUNTER — Encounter: Payer: Self-pay | Admitting: Cardiology

## 2019-12-02 ENCOUNTER — Telehealth (INDEPENDENT_AMBULATORY_CARE_PROVIDER_SITE_OTHER): Payer: Medicare PPO | Admitting: Cardiology

## 2019-12-02 VITALS — Ht 63.0 in | Wt 160.0 lb

## 2019-12-02 DIAGNOSIS — E119 Type 2 diabetes mellitus without complications: Secondary | ICD-10-CM

## 2019-12-02 DIAGNOSIS — E785 Hyperlipidemia, unspecified: Secondary | ICD-10-CM

## 2019-12-02 DIAGNOSIS — J301 Allergic rhinitis due to pollen: Secondary | ICD-10-CM | POA: Diagnosis not present

## 2019-12-02 DIAGNOSIS — R079 Chest pain, unspecified: Secondary | ICD-10-CM | POA: Diagnosis not present

## 2019-12-02 DIAGNOSIS — I10 Essential (primary) hypertension: Secondary | ICD-10-CM | POA: Diagnosis not present

## 2019-12-02 DIAGNOSIS — R600 Localized edema: Secondary | ICD-10-CM | POA: Diagnosis not present

## 2019-12-02 DIAGNOSIS — J3089 Other allergic rhinitis: Secondary | ICD-10-CM | POA: Diagnosis not present

## 2019-12-02 NOTE — Patient Instructions (Signed)
Medication Instructions:  Your physician recommends that you continue on your current medications as directed. Please refer to the Current Medication list given to you today.  *If you need a refill on your cardiac medications before your next appointment, please call your pharmacy*  Follow-Up: At [facility], you and your health needs are our priority.  As part of our continuing mission to provide you with exceptional heart care, we have created designated Provider Care Teams.  These Care Teams include your primary Cardiologist (physician) and Advanced Practice Providers (APPs -  Physician Assistants and Nurse Practitioners) who all work together to provide you with the care you need, when you need it.  We recommend signing up for the patient portal called "MyChart".  Sign up information is provided on this After Visit Summary.  MyChart is used to connect with patients for Virtual Visits (Telemedicine).  Patients are able to view lab/test results, encounter notes, upcoming appointments, etc.  Non-urgent messages can be sent to your provider as well.   To learn more about what you can do with MyChart, go to [email or web address].    Your next appointment:   1 year(s)  The format for your next appointment:   In Person  Provider:   Oswaldo Milian, MD   Other Instructions Please monitor blood pressure at home-call in 1 week with readings.

## 2019-12-06 ENCOUNTER — Other Ambulatory Visit (HOSPITAL_COMMUNITY): Payer: Self-pay | Admitting: *Deleted

## 2019-12-06 ENCOUNTER — Ambulatory Visit (INDEPENDENT_AMBULATORY_CARE_PROVIDER_SITE_OTHER): Payer: Medicare PPO

## 2019-12-06 ENCOUNTER — Ambulatory Visit: Payer: Medicare PPO | Admitting: Physical Therapy

## 2019-12-06 ENCOUNTER — Other Ambulatory Visit: Payer: Self-pay

## 2019-12-06 ENCOUNTER — Encounter: Payer: Self-pay | Admitting: Emergency Medicine

## 2019-12-06 ENCOUNTER — Ambulatory Visit: Payer: Medicare PPO | Admitting: Emergency Medicine

## 2019-12-06 VITALS — BP 134/72 | HR 60 | Ht 63.0 in | Wt 163.0 lb

## 2019-12-06 DIAGNOSIS — Z8701 Personal history of pneumonia (recurrent): Secondary | ICD-10-CM

## 2019-12-06 DIAGNOSIS — J4520 Mild intermittent asthma, uncomplicated: Secondary | ICD-10-CM | POA: Diagnosis not present

## 2019-12-06 DIAGNOSIS — R1310 Dysphagia, unspecified: Secondary | ICD-10-CM | POA: Diagnosis not present

## 2019-12-06 DIAGNOSIS — J189 Pneumonia, unspecified organism: Secondary | ICD-10-CM | POA: Diagnosis not present

## 2019-12-06 DIAGNOSIS — J301 Allergic rhinitis due to pollen: Secondary | ICD-10-CM | POA: Diagnosis not present

## 2019-12-06 DIAGNOSIS — J3089 Other allergic rhinitis: Secondary | ICD-10-CM | POA: Diagnosis not present

## 2019-12-06 NOTE — Progress Notes (Signed)
Subjective:    Patient ID: Kristi Willis, female    DOB: 1940/12/27, 79 y.o.   MRN: [id number]  HPI 79 year old never smoker with a history of diabetes, allergic rhinitis on immunotherapy, GERD with a hiatal hernia, hypertension on an ACE inhibitor. Hx of apahsia, here with her daughter who assists with the history. She carries a history of asthma that was made over 40 years ago.  She has been under recent eval for chest discomfort and dyspnea. Was hospitalized in December for this. Found to have RUL infiltrate on CT chest, was treated for PNA, also with prednisone for associated obstruction. She needs pred about 4x a year, usually for wheeze, cough, SOB. She has noted some intermittent food getting stuck, choking a few times a week. Has to go urgently Has albuterol that she only needs when flaring - a few stretches per year.    Review of Systems  Constitutional: Positive for appetite change. Negative for activity change, chills, diaphoresis, fatigue, fever and unexpected weight change.  HENT: Negative for congestion, dental problem, nosebleeds, postnasal drip, rhinorrhea, sinus pressure, sneezing, trouble swallowing and voice change.   Eyes: Negative for itching and visual disturbance.  Respiratory: Negative for cough, choking, chest tightness, shortness of breath, wheezing and stridor.   Cardiovascular: Positive for palpitations. Negative for chest pain and leg swelling.  Gastrointestinal: Negative for abdominal pain.       GERD Difficulty swallowing  Musculoskeletal: Negative for joint swelling and myalgias.  Skin: Negative for rash.  Neurological: Negative for syncope, light-headedness and headaches.  Psychiatric/Behavioral: Negative for sleep disturbance.      Past Medical History:  Diagnosis Date  . Allergy   . Asthma   . Cancer ([place])    skin  . Diabetes mellitus   . GERD (gastroesophageal reflux disease)    hiatal hernia  . Hypercholesteremia   . Hypertension   .  Neuromuscular disorder (HCC)    DM neuropathy  . Neuropathy   . SVT (supraventricular tachycardia) (HCC)      Family History  Problem Relation Age of Onset  . Cancer Mother 60       cervical cancer  . Depression Mother   . Heart disease Father   . Stroke Father   . Cancer Father        lung cancer  . Heart disease Sister 54       MI  . Hypertension Sister   . Cancer Sister 53       breast cancer  . Healthy Daughter      Social History   Socioeconomic History  . Marital status: Widowed    Spouse name: Not on file  . Number of children: 1  . Years of education: Not on file  . Highest education level: 12th grade  Occupational History  . Occupation: Retired from [employer] but still works part-time  Tobacco Use  . Smoking status: Never Smoker  . Smokeless tobacco: Never Used  Substance and Sexual Activity  . Alcohol use: No  . Drug use: No  . Sexual activity: Never  Other Topics Concern  . Not on file  Social History Narrative   Lives with husband, who is a patient of Dr Lovena Le. She is active around the house and exercises regularly.   Social Determinants of Health   Financial Resource Strain: Low Risk   . Difficulty of Paying Living Expenses: Not hard at all  Food Insecurity: No Food Insecurity  . Worried About [employer] of  Food in the Last Year: Never true  . Ran Out of Food in the Last Year: Never true  Transportation Needs: No Transportation Needs  . Lack of Transportation (Medical): No  . Lack of Transportation (Non-Medical): No  Physical Activity: Inactive  . Days of Exercise per Week: 0 days  . Minutes of Exercise per Session: 0 min  Stress: No Stress Concern Present  . Feeling of Stress : Not at all  Social Connections: Slightly Isolated  . Frequency of Communication with Friends and Family: More than three times a week  . Frequency of Social Gatherings with Friends and Family: More than three times a week  . Attends Religious Services: More than 4  times per year  . Active Member of Clubs or Organizations: Yes  . Attends Archivist Meetings: More than 4 times per year  . Marital Status: Widowed  Intimate Partner Violence: Not At Risk  . Fear of Current or Ex-Partner: No  . Emotionally Abused: No  . Physically Abused: No  . Sexually Abused: No     Allergies  Allergen Reactions  . Dimetapp C [Phenylephrine-Bromphen-Codeine] Itching  . Phenylephrine Hcl Other (See Comments)    unknown  . Ultram [Tramadol Hcl] Other (See Comments)    Insomnia  . Celebrex [Celecoxib] Rash  . Cortisone Rash  . Fenofibrate Rash  . Sulfa Antibiotics Rash     Outpatient Medications Prior to Visit  Medication Sig Dispense Refill  . Accu-Chek Softclix Lancets lancets Check sugars daily & as needed Dx E11.9 100 each 3  . acetaminophen (TYLENOL) 325 MG tablet Take 2 tablets (650 mg total) by mouth every 6 (six) hours as needed for mild pain or fever (or Fever >/= 101). 60 tablet 0  . acyclovir ointment (ZOVIRAX) 5 % APPLY TO THE AFFECTED AREA AROUND MOUTH EVERY 3 HOURS AS DIRECTED UNTIL CLEAR (Patient taking differently: Apply 1 application topically every 3 (three) hours. Using as needed for cold sores) 15 g 0  . albuterol (VENTOLIN HFA) 108 (90 BASE) MCG/ACT inhaler Inhale 2 puffs into the lungs every 6 (six) hours as needed for wheezing. 1 Inhaler 3  . amLODipine (NORVASC) 5 MG tablet TAKE 1 TABLET DAILY. 30 tablet 2  . aspirin 81 MG chewable tablet Chew 81 mg by mouth daily.    Marland Kitchen azelastine (ASTELIN) 0.1 % nasal spray Place 2 sprays into both nostrils 2 (two) times daily. 30 mL 11  . Blood Glucose Monitoring Suppl (ACCU-CHEK AVIVA PLUS) w/Device KIT Check sugars daily & as needed Dx E11.9 1 kit 0  . Calcium Carbonate-Vitamin D (CALTRATE 600+D) 600-400 MG-UNIT per tablet Take 1 tablet by mouth 2 (two) times daily.    Marland Kitchen doxazosin (CARDURA) 8 MG tablet TAKE (1) TABLET DAILY FOR HIGH BLOOD PRESSURE. (Patient taking differently: Take 8 mg by  mouth daily. ) 90 tablet 1  . EPIPEN 2-PAK 0.3 MG/0.3ML SOAJ injection Inject 0.3 mg into the muscle as needed for anaphylaxis. Reported on 01/24/2016    . escitalopram (LEXAPRO) 20 MG tablet Take 1 tablet (20 mg total) by mouth daily. 90 tablet 3  . gabapentin (NEURONTIN) 100 MG capsule Take 1 capsule (100 mg total) 3 (three) times daily by mouth. 90 capsule 3  . glucose blood (ACCU-CHEK AVIVA PLUS) test strip Check sugars daily & as needed Dx E11.9 100 each 3  . ipratropium-albuterol (DUONEB) 0.5-2.5 (3) MG/3ML SOLN Take 3 mLs by nebulization 2 (two) times daily. 125 mL 0  . levothyroxine (SYNTHROID) 75  MCG tablet Take 1 tablet (75 mcg total) by mouth daily before breakfast. 30 tablet 6  . lisinopril (ZESTRIL) 40 MG tablet TAKE 1 TABLET DAILY 90 tablet 0  . loratadine (CLARITIN) 10 MG tablet Take 1 tablet (10 mg total) by mouth daily. 30 tablet 11  . menthol-cetylpyridinium (CEPACOL) 3 MG lozenge Take 1 lozenge (3 mg total) by mouth as needed for sore throat. 100 tablet 12  . metFORMIN (GLUCOPHAGE) 1000 MG tablet TAKE  (1)  TABLET TWICE A DAY WITH MEALS (BREAKFAST AND SUPPER) 180 tablet 0  . metoprolol succinate (TOPROL-XL) 100 MG 24 hr tablet TAKE (1) TABLET TWICE A DAY. 180 tablet 0  . montelukast (SINGULAIR) 10 MG tablet Take 1 tablet (10 mg total) by mouth at bedtime. 30 tablet 5  . Multiple Vitamin (MULTIVITAMIN) tablet Take 1 tablet by mouth daily.      . naproxen (NAPROSYN) 500 MG tablet Take 1 tablet (500 mg total) by mouth 2 (two) times daily with a meal. 30 tablet 0  . Omega-3 Fatty Acids (FISH OIL) 1200 MG CAPS Take 1 capsule by mouth daily.     . permethrin (ELIMITE) 5 % cream Apply 1 application topically at bedtime. Apply form the chin down on days 1, 2, and 7 then wash upon waking up.    . Pyridoxine HCl (VITAMIN B-6 PO) Take 1 tablet by mouth 2 (two) times daily. Reported on 01/24/2016    . rosuvastatin (CRESTOR) 20 MG tablet Take 1 tablet (20 mg total) by mouth at bedtime. 30 tablet  11  . sitaGLIPtin (JANUVIA) 50 MG tablet TAKE 1 TABLET DAILY 90 tablet 0  . vitamin B-12 (CYANOCOBALAMIN) 500 MCG tablet Take 500 mcg by mouth daily.      . Vitamin D, Ergocalciferol, (DRISDOL) 1.25 MG (50000 UNIT) CAPS capsule TAKE 1 CAPSULE ONCE A WEEK 12 capsule 0  . zinc gluconate 50 MG tablet Take 50 mg by mouth daily.    . furosemide (LASIX) 20 MG tablet Take 1 tablet (20 mg total) by mouth daily as needed for fluid (Weight gain of 3 lbs in a day or 5 lbs in a week). 30 tablet 11   No facility-administered medications prior to visit.         Objective:   Physical Exam Vitals:   12/06/19 1433  BP: 134/72  Pulse: 60  SpO2: 98%  Weight: 163 lb (73.9 kg)  Height: '5\' 3"'  (1.6 m)   Gen: Pleasant, well-nourished, in no distress,  normal affect  ENT: No lesions,  mouth clear,  oropharynx clear, no postnasal drip  Neck: No JVD, no stridor  Lungs: No use of accessory muscles, no crackles or wheezing on normal respiration, no wheeze on forced expiration  Cardiovascular: RRR, heart sounds normal, no murmur or gallops, trace pretibial peripheral edema  Musculoskeletal: No deformities, no cyanosis or clubbing  Neuro: alert, awake, she only interacts with one-word responses, relies on her daughter to answer most questions.  History of unexplained aphasia  No weakness or other deficits noted  Skin: Warm, no lesions or rash      Assessment & Plan:  Asthma Carries a longstanding history of asthma.  Not entirely clear with her most recent symptoms have reflected lower airways obstruction.  She has a history of dysphagia, intermittent regurgitation, choking.  A recent right upper lobe pneumonia would be consistent with aspiration.  She may be experiencing upper airway obstruction, stridor. I believe we need to quantify her degree of obstruction with PFT.  Also needs a modified barium swallow to assess for occult aspiration.  She may ultimately require a GI evaluation for her dysphagia  and regurgitation in the setting of hiatal hernia.  The modified barium swallow should be helpful with this.  Keep albuterol available to use as needed.  If she has true lower airways obstruction then she may benefit from schedule bronchodilator therapy.  Chest x-ray today to ensure continued clearance of her right upper lobe infiltrate.  We may decide to repeat a CT scan of the chest so that we can compare directly with the December film.  We will arrange for full pulmonary function testing at your next office visit We will arrange for a modified barium swallowing test Keep your albuterol available to use 2 puffs if needed for shortness of breath, chest tightness, wheezing. Chest x-ray today Follow with Dr. Lamonte Sakai next available with full pulmonary function testing on the same day.   Baltazar Apo, MD, PhD 12/06/2019, 3:00 PM [facility] [phone] or if no answer [phone]

## 2019-12-06 NOTE — Patient Instructions (Signed)
We will arrange for full pulmonary function testing at your next office visit We will arrange for a modified barium swallowing test Keep your albuterol available to use 2 puffs if needed for shortness of breath, chest tightness, wheezing. Chest x-ray today Follow with Dr. Lamonte Sakai next available with full pulmonary function testing on the same day.

## 2019-12-06 NOTE — Assessment & Plan Note (Signed)
Carries a longstanding history of asthma.  Not entirely clear with her most recent symptoms have reflected lower airways obstruction.  She has a history of dysphagia, intermittent regurgitation, choking.  A recent right upper lobe pneumonia would be consistent with aspiration.  She may be experiencing upper airway obstruction, stridor. I believe we need to quantify her degree of obstruction with PFT.  Also needs a modified barium swallow to assess for occult aspiration.  She may ultimately require a GI evaluation for her dysphagia and regurgitation in the setting of hiatal hernia.  The modified barium swallow should be helpful with this.  Keep albuterol available to use as needed.  If she has true lower airways obstruction then she may benefit from schedule bronchodilator therapy.  Chest x-ray today to ensure continued clearance of her right upper lobe infiltrate.  We may decide to repeat a CT scan of the chest so that we can compare directly with the December film.  We will arrange for full pulmonary function testing at your next office visit We will arrange for a modified barium swallowing test Keep your albuterol available to use 2 puffs if needed for shortness of breath, chest tightness, wheezing. Chest x-ray today Follow with Dr. Lamonte Sakai next available with full pulmonary function testing on the same day.

## 2019-12-08 ENCOUNTER — Ambulatory Visit: Payer: Medicare PPO | Admitting: Physical Therapy

## 2019-12-08 ENCOUNTER — Encounter: Payer: Self-pay | Admitting: Physical Therapy

## 2019-12-08 ENCOUNTER — Other Ambulatory Visit: Payer: Self-pay

## 2019-12-08 DIAGNOSIS — R1310 Dysphagia, unspecified: Secondary | ICD-10-CM

## 2019-12-08 DIAGNOSIS — M25612 Stiffness of left shoulder, not elsewhere classified: Secondary | ICD-10-CM

## 2019-12-08 DIAGNOSIS — M25512 Pain in left shoulder: Secondary | ICD-10-CM

## 2019-12-08 DIAGNOSIS — G8929 Other chronic pain: Secondary | ICD-10-CM | POA: Diagnosis not present

## 2019-12-08 NOTE — Therapy (Signed)
[facility] [place], [place], [address] Phone: [phone]   Fax:  [phone]  Physical Therapy Treatment  Patient Details  Name: Kristi Willis MRN: [id number] Date of Birth: 11/05/1940 Referring Provider (PT): Joni Fears MD   Encounter Date: 12/08/2019  PT End of Session - 12/08/19 1052    Visit Number  9    Number of Visits  12    Date for PT Re-Evaluation  02/07/20    PT Start Time  0900    PT Stop Time  0957    PT Time Calculation (min)  57 min       Past Medical History:  Diagnosis Date  . Allergy   . Asthma   . Cancer ([place])    skin  . Diabetes mellitus   . GERD (gastroesophageal reflux disease)    hiatal hernia  . Hypercholesteremia   . Hypertension   . Neuromuscular disorder (HCC)    DM neuropathy  . Neuropathy   . SVT (supraventricular tachycardia) (HCC)     Past Surgical History:  Procedure Laterality Date  . BREAST BIOPSY     left breast biopsy  . LUMBAR LAMINECTOMY  08/10  . SKIN CANCER EXCISION  2005  . SPINE SURGERY     lumbar laminectomy    There were no vitals filed for this visit.  Subjective Assessment - 12/08/19 1022    Subjective  COVID-19 screen performed prior to patient entering clinic.  Pain at 3/10.    Pertinent History  Asthma, DM, GERD, HTN, SVT, neuropathy.    Patient Stated Goals  Use arm without pain.    Currently in Pain?  Yes    Pain Score  3     Pain Location  Shoulder    Pain Orientation  Left    Pain Descriptors / Indicators  Aching;Sore    Pain Type  Chronic pain    Pain Onset  More than a month ago                       [facility] Adult PT Treatment/Exercise - 12/08/19 0001      Moist Heat Therapy   Number Minutes Moist Heat  20 Minutes    Moist Heat Location  --   Left shoulder.     Acupuncturist Location  Left shoulder.    Electrical Stimulation Action  Pre-mod.    Electrical Stimulation  Parameters  80-150 Hz x 20 minutes.    Electrical Stimulation Goals  Pain      Manual Therapy   Manual Therapy  Soft tissue mobilization;Passive ROM    Soft tissue mobilization  STW/M x 9 minutes to patient's affected left shoulder musculature.    Passive ROM  PROM x 14 minutes to patient left shoulder.               PT Short Term Goals - 11/08/19 1145      PT SHORT TERM GOAL #1   Title  STG's=LTG's.        PT Long Term Goals - 11/22/19 0951      PT LONG TERM GOAL #1   Title  Independent with a HEP.    Time  6    Period  Weeks    Status  On-going      PT LONG TERM GOAL #2   Title  Active left shoulder flexion to 145 degrees so the patient can easily reach overhead.  Baseline  130 degrees passively    Time  6    Period  Weeks    Status  On-going   AROM 106 degrees 11/22/19     PT LONG TERM GOAL #3   Title  Perform ADL's with pain not > 3/10.    Time  6    Period  Weeks    Status  On-going   5/10 11/22/19             Patient will benefit from skilled therapeutic intervention in order to improve the following deficits and impairments:     Visit Diagnosis: Dysphagia, unspecified type  Chronic left shoulder pain  Stiffness of left shoulder, not elsewhere classified     Problem List Patient Active Problem List   Diagnosis Date Noted  . Pain in left shoulder 11/03/2019  . Acute metabolic encephalopathy [id number]  . Type 2 diabetes mellitus ([place]) 08/05/2019  . Anemia 08/05/2019  . QT prolongation 08/05/2019  . Pneumonia 08/04/2019  . Hypothyroidism 02/12/2019  . Speech articulation disorder 09/01/2018  . Essential hypertension 10/03/2017  . GAD (generalized anxiety disorder) 07/03/2017  . Uncontrolled type 2 diabetes mellitus with diabetic neuropathy, without long-term current use of insulin ([place]) 09/27/2016  . Depression 03/29/2016  . Insomnia 07/18/2015  . Vitamin D deficiency 10/11/2013  . Right carotid bruit 03/02/2013  . Degenerative  disc disease, lumbar 11/27/2012  . Asthma 11/13/2010  . Hyperlipidemia associated with type 2 diabetes mellitus ([place]) 11/13/2010  . Cancer of skin 11/13/2010  . Diabetic neuropathy ([place]) 11/13/2010  . Wide-complex tachycardia==Atrial Tachycardia 11/13/2010    [name], [place] MPT 12/08/2019, 10:55 AM  [facility] [address] [place], [place], [address] Phone: [phone]   Fax:  [phone]  Name: Kristi Willis MRN: [id number] Date of Birth: September 27, 1940

## 2019-12-15 ENCOUNTER — Other Ambulatory Visit: Payer: Self-pay

## 2019-12-15 ENCOUNTER — Ambulatory Visit (HOSPITAL_COMMUNITY)
Admission: RE | Admit: 2019-12-15 | Discharge: 2019-12-15 | Disposition: A | Discharge status: Home / Self Care | Payer: Medicare PPO | Source: Ambulatory Visit | Attending: Emergency Medicine | Admitting: Emergency Medicine

## 2019-12-15 ENCOUNTER — Ambulatory Visit: Payer: Medicare PPO | Admitting: Physical Therapy

## 2019-12-15 DIAGNOSIS — M25612 Stiffness of left shoulder, not elsewhere classified: Secondary | ICD-10-CM

## 2019-12-15 DIAGNOSIS — G8929 Other chronic pain: Secondary | ICD-10-CM | POA: Diagnosis not present

## 2019-12-15 DIAGNOSIS — R1310 Dysphagia, unspecified: Secondary | ICD-10-CM | POA: Insufficient documentation

## 2019-12-15 DIAGNOSIS — J301 Allergic rhinitis due to pollen: Secondary | ICD-10-CM | POA: Diagnosis not present

## 2019-12-15 DIAGNOSIS — M25512 Pain in left shoulder: Secondary | ICD-10-CM

## 2019-12-15 DIAGNOSIS — J3089 Other allergic rhinitis: Secondary | ICD-10-CM | POA: Diagnosis not present

## 2019-12-15 NOTE — Therapy (Signed)
[facility] [place], [place], [address] Phone: [phone]   Fax:  [phone]  Physical Therapy Treatment  Patient Details  Name: Kristi Willis MRN: [id number] Date of Birth: 1941-04-14 Referring Provider (PT): Joni Fears MD   Encounter Date: 12/15/2019  PT End of Session - 12/15/19 0943    Visit Number  10    Number of Visits  12    Date for PT Re-Evaluation  02/07/20    PT Start Time  0900    PT Stop Time  0947    PT Time Calculation (min)  47 min    Activity Tolerance  Patient tolerated treatment well    Behavior During Therapy  [facility] for tasks assessed/performed       Past Medical History:  Diagnosis Date  . Allergy   . Asthma   . Cancer ([place])    skin  . Diabetes mellitus   . GERD (gastroesophageal reflux disease)    hiatal hernia  . Hypercholesteremia   . Hypertension   . Neuromuscular disorder (HCC)    DM neuropathy  . Neuropathy   . SVT (supraventricular tachycardia) (HCC)     Past Surgical History:  Procedure Laterality Date  . BREAST BIOPSY     left breast biopsy  . LUMBAR LAMINECTOMY  08/10  . SKIN CANCER EXCISION  2005  . SPINE SURGERY     lumbar laminectomy    There were no vitals filed for this visit.  Subjective Assessment - 12/15/19 0936    Subjective  COVID-19 screen performed prior to patient entering clinic.  Pain is staying low.    Pertinent History  Asthma, DM, GERD, HTN, SVT, neuropathy.    Patient Stated Goals  Use arm without pain.    Currently in Pain?  Yes    Pain Score  3     Pain Location  Shoulder    Pain Orientation  Left    Pain Descriptors / Indicators  Aching;Sore    Pain Type  Chronic pain    Pain Onset  More than a month ago                       [facility] Adult PT Treatment/Exercise - 12/15/19 0001      Exercises   Exercises  Shoulder      Shoulder Exercises: Pulleys   Flexion  5 minutes    Other Pulley Exercises  Wall ladder x 5  minutes.      Shoulder Exercises: ROM/Strengthening   UBE (Upper Arm Bike)  90 RPM's x 8 minutes.      Modalities   Modalities  Vasopneumatic      Vasopneumatic   Number Minutes Vasopneumatic   15 minutes    Vasopnuematic Location   --   Left shoulder.   Vasopneumatic Pressure  Low      Manual Therapy   Manual Therapy  Soft tissue mobilization    Soft tissue mobilization  IASTM x 5 minutes to affected left shoulder musculature to reduce tone.               PT Short Term Goals - 11/08/19 1145      PT SHORT TERM GOAL #1   Title  STG's=LTG's.        PT Long Term Goals - 11/22/19 0951      PT LONG TERM GOAL #1   Title  Independent with a HEP.    Time  6  Period  Weeks    Status  On-going      PT LONG TERM GOAL #2   Title  Active left shoulder flexion to 145 degrees so the patient can easily reach overhead.    Baseline  130 degrees passively    Time  6    Period  Weeks    Status  On-going   AROM 106 degrees 11/22/19     PT LONG TERM GOAL #3   Title  Perform ADL's with pain not > 3/10.    Time  6    Period  Weeks    Status  On-going   5/10 11/22/19           Plan - 12/15/19 0944    Clinical Impression Statement  See "Therapy Note" section.    Personal Factors and Comorbidities  Comorbidity 1;Comorbidity 2    Comorbidities  Asthma, DM, GERD, HTN, SVT, neuropathy.    Examination-Activity Limitations  Reach Overhead;Other    Examination-Participation Restrictions  Other    Stability/Clinical Decision Making  Evolving/Moderate complexity    Rehab Potential  Good    PT Frequency  2x / week    PT Duration  6 weeks    PT Treatment/Interventions  ADLs/Self Care Home Management;Cryotherapy;Electrical Stimulation;Ultrasound;Moist Heat;Iontophoresis 4mg /ml Dexamethasone;Therapeutic activities;Therapeutic exercise;Manual techniques;Patient/family education;Passive range of motion;Dry needling;Joint Manipulations    PT Next Visit Plan  cont to progress with  AAROM (pulley's and seated UE ranger) STW/M /modalities as needed.    Consulted and Agree with Plan of Care  Patient       Patient will benefit from skilled therapeutic intervention in order to improve the following deficits and impairments:  Pain, Decreased activity tolerance, Decreased range of motion, Decreased strength  Visit Diagnosis: Chronic left shoulder pain  Stiffness of left shoulder, not elsewhere classified     Problem List Patient Active Problem List   Diagnosis Date Noted  . Pain in left shoulder 11/03/2019  . Acute metabolic encephalopathy [id number]  . Type 2 diabetes mellitus ([place]) 08/05/2019  . Anemia 08/05/2019  . QT prolongation 08/05/2019  . Pneumonia 08/04/2019  . Hypothyroidism 02/12/2019  . Speech articulation disorder 09/01/2018  . Essential hypertension 10/03/2017  . GAD (generalized anxiety disorder) 07/03/2017  . Uncontrolled type 2 diabetes mellitus with diabetic neuropathy, without long-term current use of insulin ([place]) 09/27/2016  . Depression 03/29/2016  . Insomnia 07/18/2015  . Vitamin D deficiency 10/11/2013  . Right carotid bruit 03/02/2013  . Degenerative disc disease, lumbar 11/27/2012  . Asthma 11/13/2010  . Hyperlipidemia associated with type 2 diabetes mellitus ([place]) 11/13/2010  . Cancer of skin 11/13/2010  . Diabetic neuropathy ([place]) 11/13/2010  . Wide-complex tachycardia==Atrial Tachycardia 11/13/2010    Progress Note Reporting Period 11/08/19 to 12/15/19.  See note below for Objective Data and Assessment of Progress/Goals.   Patient reporting lowered pain-levels and a significant increase in range of motion.     Naresh Althaus, [place] MPT 12/15/2019, 9:52 AM  [facility] [address] [place], [place], [address] Phone: [phone]   Fax:  [phone]  Name: Kristi Willis MRN: [id number] Date of Birth: May 07, 1941

## 2019-12-17 ENCOUNTER — Other Ambulatory Visit: Payer: Self-pay

## 2019-12-17 ENCOUNTER — Other Ambulatory Visit: Payer: Medicare PPO

## 2019-12-17 DIAGNOSIS — E559 Vitamin D deficiency, unspecified: Secondary | ICD-10-CM | POA: Diagnosis not present

## 2019-12-17 DIAGNOSIS — E039 Hypothyroidism, unspecified: Secondary | ICD-10-CM | POA: Diagnosis not present

## 2019-12-18 LAB — TSH: TSH: 1.76 u[IU]/mL (ref 0.450–4.500)

## 2019-12-18 LAB — VITAMIN D 25 HYDROXY (VIT D DEFICIENCY, FRACTURES): Vit D, 25-Hydroxy: 49.7 ng/mL (ref 30.0–100.0)

## 2019-12-18 LAB — T4, FREE: Free T4: 1.14 ng/dL (ref 0.82–1.77)

## 2019-12-22 ENCOUNTER — Other Ambulatory Visit: Payer: Self-pay

## 2019-12-22 ENCOUNTER — Ambulatory Visit: Payer: Medicare PPO | Attending: Orthopaedic Surgery | Admitting: Physical Therapy

## 2019-12-22 DIAGNOSIS — G8929 Other chronic pain: Secondary | ICD-10-CM

## 2019-12-22 DIAGNOSIS — M25512 Pain in left shoulder: Secondary | ICD-10-CM | POA: Insufficient documentation

## 2019-12-22 DIAGNOSIS — M25612 Stiffness of left shoulder, not elsewhere classified: Secondary | ICD-10-CM | POA: Diagnosis not present

## 2019-12-22 NOTE — Therapy (Signed)
[facility] [place], [place], [address] Phone: [phone]   Fax:  [phone]  Physical Therapy Treatment  Patient Details  Name: Kristi Willis MRN: [id number] Date of Birth: 24-Mar-1941 Referring Provider (PT): Joni Fears MD   Encounter Date: 12/22/2019  PT End of Session - 12/22/19 1218    Visit Number  11    Number of Visits  12    Date for PT Re-Evaluation  02/07/20    PT Start Time  0900    PT Stop Time  0945    PT Time Calculation (min)  45 min    Activity Tolerance  Patient tolerated treatment well    Behavior During Therapy  [facility] for tasks assessed/performed       Past Medical History:  Diagnosis Date  . Allergy   . Asthma   . Cancer ([place])    skin  . Diabetes mellitus   . GERD (gastroesophageal reflux disease)    hiatal hernia  . Hypercholesteremia   . Hypertension   . Neuromuscular disorder (HCC)    DM neuropathy  . Neuropathy   . SVT (supraventricular tachycardia) (H[facility])     Past Surgical History:  Procedure Laterality Date  . BREAST BIOPSY     left breast biopsy  . LUMBAR LAMINECTOMY  08/10  . SKIN CANCER EXCISION  2005  . SPINE SURGERY     lumbar laminectomy    There were no vitals filed for this visit.  Subjective Assessment - 12/22/19 1030    Subjective  COVID-19 screen performed prior to patient entering clinic.  Pain down.    Pertinent History  Asthma, DM, GERD, HTN, SVT, neuropathy.    Patient Stated Goals  Use arm without pain.    Currently in Pain?  Yes    Pain Score  2     Pain Location  Shoulder    Pain Orientation  Left    Pain Descriptors / Indicators  Aching;Sore    Pain Type  Chronic pain    Pain Onset  More than a month ago                       [facility] Adult PT Treatment/Exercise - 12/22/19 0001      Exercises   Exercises  Shoulder      Shoulder Exercises: Pulleys   Flexion  5 minutes    Other Pulley Exercises  UE Ranger seated x 5  minutes.      Shoulder Exercises: ROM/Strengthening   UBE (Upper Arm Bike)  120 RPM's x 8 minutes.      Moist Heat Therapy   Number Minutes Moist Heat  15 Minutes    Moist Heat Location  --   Left shoulder.     Manual Therapy   Passive ROM  Left shoulder passive range of motion to patient tolerance x 5 minutes.               PT Short Term Goals - 11/08/19 1145      PT SHORT TERM GOAL #1   Title  STG's=LTG's.        PT Long Term Goals - 11/22/19 0951      PT LONG TERM GOAL #1   Title  Independent with a HEP.    Time  6    Period  Weeks    Status  On-going      PT LONG TERM GOAL #2   Title  Active left  shoulder flexion to 145 degrees so the patient can easily reach overhead.    Baseline  130 degrees passively    Time  6    Period  Weeks    Status  On-going   AROM 106 degrees 11/22/19     PT LONG TERM GOAL #3   Title  Perform ADL's with pain not > 3/10.    Time  6    Period  Weeks    Status  On-going   5/10 11/22/19           Plan - 12/22/19 1113    Clinical Impression Statement  Patient is doing very well with a low pain-level and increased left shoulder range of motion.    Personal Factors and Comorbidities  Comorbidity 1;Comorbidity 2    Comorbidities  Asthma, DM, GERD, HTN, SVT, neuropathy.    Examination-Activity Limitations  Reach Overhead;Other    Examination-Participation Restrictions  Other    Stability/Clinical Decision Making  Evolving/Moderate complexity    Rehab Potential  Good    PT Frequency  2x / week    PT Treatment/Interventions  ADLs/Self Care Home Management;Cryotherapy;Electrical Stimulation;Ultrasound;Moist Heat;Iontophoresis 4mg /ml Dexamethasone;Therapeutic activities;Therapeutic exercise;Manual techniques;Patient/family education;Passive range of motion;Dry needling;Joint Manipulations    PT Next Visit Plan  cont to progress with AAROM (pulley's and seated UE ranger) STW/M /modalities as needed.    Consulted and Agree with Plan  of Care  Patient       Patient will benefit from skilled therapeutic intervention in order to improve the following deficits and impairments:  Pain, Decreased activity tolerance, Decreased range of motion, Decreased strength  Visit Diagnosis: Chronic left shoulder pain  Stiffness of left shoulder, not elsewhere classified     Problem List Patient Active Problem List   Diagnosis Date Noted  . Pain in left shoulder 11/03/2019  . Acute metabolic encephalopathy [id number]  . Type 2 diabetes mellitus ([place]) 08/05/2019  . Anemia 08/05/2019  . QT prolongation 08/05/2019  . Pneumonia 08/04/2019  . Hypothyroidism 02/12/2019  . Speech articulation disorder 09/01/2018  . Essential hypertension 10/03/2017  . GAD (generalized anxiety disorder) 07/03/2017  . Uncontrolled type 2 diabetes mellitus with diabetic neuropathy, without long-term current use of insulin ([place]) 09/27/2016  . Depression 03/29/2016  . Insomnia 07/18/2015  . Vitamin D deficiency 10/11/2013  . Right carotid bruit 03/02/2013  . Degenerative disc disease, lumbar 11/27/2012  . Asthma 11/13/2010  . Hyperlipidemia associated with type 2 diabetes mellitus ([place]) 11/13/2010  . Cancer of skin 11/13/2010  . Diabetic neuropathy ([place]) 11/13/2010  . Wide-complex tachycardia==Atrial Tachycardia 11/13/2010    [place], [place]  MPT 12/22/2019, 12:18 PM  [facility] [address] [place], [place], [address] Phone: [phone]   Fax:  [phone]  Name: CHALIYAH TESON MRN: [id number] Date of Birth: 07-02-1941

## 2019-12-23 DIAGNOSIS — J3089 Other allergic rhinitis: Secondary | ICD-10-CM | POA: Diagnosis not present

## 2019-12-23 DIAGNOSIS — J301 Allergic rhinitis due to pollen: Secondary | ICD-10-CM | POA: Diagnosis not present

## 2019-12-27 ENCOUNTER — Encounter: Payer: Self-pay | Admitting: "Endocrinology

## 2019-12-27 ENCOUNTER — Other Ambulatory Visit: Payer: Self-pay

## 2019-12-27 ENCOUNTER — Ambulatory Visit (INDEPENDENT_AMBULATORY_CARE_PROVIDER_SITE_OTHER): Payer: Medicare PPO | Admitting: "Endocrinology

## 2019-12-27 VITALS — BP 149/55 | HR 65 | Ht 63.0 in | Wt 162.4 lb

## 2019-12-27 DIAGNOSIS — E782 Mixed hyperlipidemia: Secondary | ICD-10-CM

## 2019-12-27 DIAGNOSIS — I10 Essential (primary) hypertension: Secondary | ICD-10-CM | POA: Diagnosis not present

## 2019-12-27 DIAGNOSIS — E559 Vitamin D deficiency, unspecified: Secondary | ICD-10-CM

## 2019-12-27 DIAGNOSIS — E1165 Type 2 diabetes mellitus with hyperglycemia: Secondary | ICD-10-CM

## 2019-12-27 DIAGNOSIS — E039 Hypothyroidism, unspecified: Secondary | ICD-10-CM

## 2019-12-27 LAB — POCT GLYCOSYLATED HEMOGLOBIN (HGB A1C): Hemoglobin A1C: 7 % — AB (ref 4.0–5.6)

## 2019-12-27 NOTE — Patient Instructions (Signed)
                                     Advice for Weight Management  -For most of us the best way to lose weight is by diet management. Generally speaking, diet management means consuming less calories intentionally which over time brings about progressive weight loss.  This can be achieved more effectively by restricting carbohydrate consumption to the minimum possible.  So, it is critically important to know your numbers: how much calorie you are consuming and how much calorie you need. More importantly, our carbohydrates sources should be unprocessed or minimally processed complex starch food items.   Sometimes, it is important to balance nutrition by increasing protein intake (animal or plant source), fruits, and vegetables.  -Sticking to a routine mealtime to eat 3 meals a day and avoiding unnecessary snacks is shown to have a big role in weight control. Under normal circumstances, the only time we lose real weight is when we are hungry, so allow hunger to take place- hunger means no food between meal times, only water.  It is not advisable to starve.   -It is better to avoid simple carbohydrates including: Cakes, Sweet Desserts, Ice Cream, Soda (diet and regular), Sweet Tea, Candies, Chips, Cookies, Store Bought Juices, Alcohol in Excess of  1-2 drinks a day, Artificial Sweeteners, Doughnuts, Coffee Creamers, "Sugar-free" Products, etc, etc.  This is not a complete list.....    -Consulting with certified diabetes educators is proven to provide you with the most accurate and current information on diet.  Also, you may be  interested in discussing diet options/exchanges , we can schedule a visit with Kristi Willis, RDN, CDE for individualized nutrition education.  -Exercise: If you are able: 30 -60 minutes a day ,4 days a week, or 150 minutes a week.  The longer the better.  Combine stretch, strength, and aerobic activities.  If you were told in the past that you  have high risk for cardiovascular diseases, you may seek evaluation by your heart doctor prior to initiating moderate to intense exercise programs.                                  Additional Care Considerations for Diabetes   -Diabetes  is a chronic disease.  The most important care consideration is regular follow-up with your diabetes care provider with the goal being avoiding or delaying its complications and to take advantage of advances in medications and technology.    -Type 2 diabetes is known to coexist with other important comorbidities such as high blood pressure and high cholesterol.  It is critical to control not only the diabetes but also the high blood pressure and high cholesterol to minimize and delay the risk of complications including coronary artery disease, stroke, amputations, blindness, etc.    - Studies showed that people with diabetes will benefit from a class of medications known as ACE inhibitors and statins.  Unless there are specific reasons not to be on these medications, the standard of care is to consider getting one from these groups of medications at an optimal doses.  These medications are generally considered safe and proven to help protect the heart and the kidneys.    - People with diabetes are encouraged to initiate and maintain regular follow-up with eye doctors, foot doctors, dentists ,   and if necessary heart and kidney doctors.     - It is highly recommended that people with diabetes quit smoking or stay away from smoking, and get yearly  flu vaccine and pneumonia vaccine at least every 5 years.  One other important lifestyle recommendation is to ensure adequate sleep - at least 6-7 hours of uninterrupted sleep at night.  -Exercise: If you are able: 30 -60 minutes a day, 4 days a week, or 150 minutes a week.  The longer the better.  Combine stretch, strength, and aerobic activities.  If you were told in the past that you have high risk for cardiovascular  diseases, you may seek evaluation by your heart doctor prior to initiating moderate to intense exercise programs.          

## 2019-12-27 NOTE — Progress Notes (Signed)
12/27/2019  Endocrinology follow-up note   Subjective:    Patient ID: Kristi Willis, female    DOB: 08-01-1941.  She is being seen in follow-up  for  management of type 2 diabetes, hyperlipidemia, hypertension. PCP: Sharion Balloon, FNP  Past Medical History:  Diagnosis Date  . Allergy   . Asthma   . Cancer ([place])    skin  . Diabetes mellitus   . GERD (gastroesophageal reflux disease)    hiatal hernia  . Hypercholesteremia   . Hypertension   . Neuromuscular disorder (HCC)    DM neuropathy  . Neuropathy   . SVT (supraventricular tachycardia) (HCC)    Past Surgical History:  Procedure Laterality Date  . BREAST BIOPSY     left breast biopsy  . LUMBAR LAMINECTOMY  08/10  . SKIN CANCER EXCISION  2005  . SPINE SURGERY     lumbar laminectomy   Social History   Socioeconomic History  . Marital status: Widowed    Spouse name: Not on file  . Number of children: 1  . Years of education: Not on file  . Highest education level: 12th grade  Occupational History  . Occupation: Retired from [employer] but still works part-time  Tobacco Use  . Smoking status: Never Smoker  . Smokeless tobacco: Never Used  Substance and Sexual Activity  . Alcohol use: No  . Drug use: No  . Sexual activity: Never  Other Topics Concern  . Not on file  Social History Narrative   Lives with husband, who is a patient of Dr Lovena Le. She is active around the house and exercises regularly.   Social Determinants of Health   Financial Resource Strain: Low Risk   . Difficulty of Paying Living Expenses: Not hard at all  Food Insecurity: No Food Insecurity  . Worried About Charity fundraiser in the Last Year: Never true  . Ran Out of Food in the Last Year: Never true  Transportation Needs: No Transportation Needs  . Lack of Transportation (Medical): No  . Lack of Transportation (Non-Medical): No  Physical Activity: Inactive  . Days of Exercise per Week: 0 days  . Minutes of Exercise per  Session: 0 min  Stress: No Stress Concern Present  . Feeling of Stress : Not at all  Social Connections: Slightly Isolated  . Frequency of Communication with Friends and Family: More than three times a week  . Frequency of Social Gatherings with Friends and Family: More than three times a week  . Attends Religious Services: More than 4 times per year  . Active Member of Clubs or Organizations: Yes  . Attends Archivist Meetings: More than 4 times per year  . Marital Status: Widowed   Outpatient Encounter Medications as of 12/27/2019  Medication Sig  . Accu-Chek Softclix Lancets lancets Check sugars daily & as needed Dx E11.9  . acetaminophen (TYLENOL) 325 MG tablet Take 2 tablets (650 mg total) by mouth every 6 (six) hours as needed for mild pain or fever (or Fever >/= 101).  Marland Kitchen acyclovir ointment (ZOVIRAX) 5 % APPLY TO THE AFFECTED AREA AROUND MOUTH EVERY 3 HOURS AS DIRECTED UNTIL CLEAR (Patient taking differently: Apply 1 application topically every 3 (three) hours. Using as needed for cold sores)  . albuterol (VENTOLIN HFA) 108 (90 BASE) MCG/ACT inhaler Inhale 2 puffs into the lungs every 6 (six) hours as needed for wheezing.  Marland Kitchen amLODipine (NORVASC) 5 MG tablet TAKE 1 TABLET DAILY.  Marland Kitchen aspirin  81 MG chewable tablet Chew 81 mg by mouth daily.  Marland Kitchen azelastine (ASTELIN) 0.1 % nasal spray Place 2 sprays into both nostrils 2 (two) times daily.  . Blood Glucose Monitoring Suppl (ACCU-CHEK AVIVA PLUS) w/Device KIT Check sugars daily & as needed Dx E11.9  . Calcium Carbonate-Vitamin D (CALTRATE 600+D) 600-400 MG-UNIT per tablet Take 1 tablet by mouth 2 (two) times daily.  Marland Kitchen doxazosin (CARDURA) 8 MG tablet TAKE (1) TABLET DAILY FOR HIGH BLOOD PRESSURE. (Patient taking differently: Take 8 mg by mouth daily. )  . EPIPEN 2-PAK 0.3 MG/0.3ML SOAJ injection Inject 0.3 mg into the muscle as needed for anaphylaxis. Reported on 01/24/2016  . escitalopram (LEXAPRO) 20 MG tablet Take 1 tablet (20 mg total)  by mouth daily.  . furosemide (LASIX) 20 MG tablet Take 1 tablet (20 mg total) by mouth daily as needed for fluid (Weight gain of 3 lbs in a day or 5 lbs in a week).  . gabapentin (NEURONTIN) 100 MG capsule Take 1 capsule (100 mg total) 3 (three) times daily by mouth.  Marland Kitchen glucose blood (ACCU-CHEK AVIVA PLUS) test strip Check sugars daily & as needed Dx E11.9  . ipratropium-albuterol (DUONEB) 0.5-2.5 (3) MG/3ML SOLN Take 3 mLs by nebulization 2 (two) times daily.  Marland Kitchen levothyroxine (SYNTHROID) 75 MCG tablet Take 1 tablet (75 mcg total) by mouth daily before breakfast.  . lisinopril (ZESTRIL) 40 MG tablet TAKE 1 TABLET DAILY  . loratadine (CLARITIN) 10 MG tablet Take 1 tablet (10 mg total) by mouth daily.  Marland Kitchen menthol-cetylpyridinium (CEPACOL) 3 MG lozenge Take 1 lozenge (3 mg total) by mouth as needed for sore throat.  . metFORMIN (GLUCOPHAGE) 1000 MG tablet TAKE  (1)  TABLET TWICE A DAY WITH MEALS (BREAKFAST AND SUPPER)  . metoprolol succinate (TOPROL-XL) 100 MG 24 hr tablet TAKE (1) TABLET TWICE A DAY.  . montelukast (SINGULAIR) 10 MG tablet Take 1 tablet (10 mg total) by mouth at bedtime.  . Multiple Vitamin (MULTIVITAMIN) tablet Take 1 tablet by mouth daily.    . naproxen (NAPROSYN) 500 MG tablet Take 1 tablet (500 mg total) by mouth 2 (two) times daily with a meal.  . Omega-3 Fatty Acids (FISH OIL) 1200 MG CAPS Take 1 capsule by mouth daily.   . permethrin (ELIMITE) 5 % cream Apply 1 application topically at bedtime. Apply form the chin down on days 1, 2, and 7 then wash upon waking up.  . Pyridoxine HCl (VITAMIN B-6 PO) Take 1 tablet by mouth 2 (two) times daily. Reported on 01/24/2016  . rosuvastatin (CRESTOR) 20 MG tablet Take 1 tablet (20 mg total) by mouth at bedtime.  . sitaGLIPtin (JANUVIA) 50 MG tablet TAKE 1 TABLET DAILY  . vitamin B-12 (CYANOCOBALAMIN) 500 MCG tablet Take 500 mcg by mouth daily.    Marland Kitchen zinc gluconate 50 MG tablet Take 50 mg by mouth daily.  . [DISCONTINUED] Vitamin D,  Ergocalciferol, (DRISDOL) 1.25 MG (50000 UNIT) CAPS capsule TAKE 1 CAPSULE ONCE A WEEK   No facility-administered encounter medications on file as of 12/27/2019.   ALLERGIES: Allergies  Allergen Reactions  . Dimetapp C [Phenylephrine-Bromphen-Codeine] Itching  . Phenylephrine Hcl Other (See Comments)    unknown  . Ultram [Tramadol Hcl] Other (See Comments)    Insomnia  . Celebrex [Celecoxib] Rash  . Cortisone Rash  . Fenofibrate Rash  . Sulfa Antibiotics Rash   VACCINATION STATUS: Immunization History  Administered Date(s) Administered  . Fluad Quad(high Dose 65+) 06/29/2019  . Influenza, High Dose  Seasonal PF 06/13/2016, 07/03/2017, 06/16/2018  . Influenza,inj,Quad PF,6+ Mos 05/20/2013, 05/31/2014, 07/18/2015  . Influenza-Unspecified 08/20/2011  . Moderna SARS-COVID-2 Vaccination 09/23/2019, 10/22/2019  . Pneumococcal Conjugate-13 05/31/2014  . Pneumococcal Polysaccharide-23 07/22/2012  . Tdap 02/02/2015    Diabetes She presents for her follow-up diabetic visit. She has type 2 diabetes mellitus. Onset time: She was diagnosed at approximate age of 8 years. Her disease course has been stable. There are no hypoglycemic associated symptoms. Pertinent negatives for hypoglycemia include no confusion, headaches, pallor or seizures. Pertinent negatives for diabetes include no chest pain, no fatigue, no foot paresthesias, no polydipsia, no polyphagia and no polyuria. There are no hypoglycemic complications. Symptoms are stable. Diabetic complications include peripheral neuropathy. Risk factors for coronary artery disease include diabetes mellitus, dyslipidemia, hypertension and sedentary lifestyle. Her weight is stable. She is following a generally unhealthy diet. When asked about meal planning, she reported none. She has not had a previous visit with a dietitian. She rarely participates in exercise. There is no change in her home blood glucose trend. Her overall blood glucose range is 140-180  mg/dl. (She monitors blood glucose only rarely, and denies hypoglycemia.  Her point-of-care A1c is stable at 7%. ) An ACE inhibitor/angiotensin II receptor blocker is being taken. She sees a podiatrist.Eye exam is current.  Hyperlipidemia This is a chronic problem. The current episode started more than 1 year ago. The problem is controlled. Recent lipid tests were reviewed and are high. Exacerbating diseases include diabetes. Pertinent negatives include no chest pain, myalgias or shortness of breath. Current antihyperlipidemic treatment includes statins. Risk factors for coronary artery disease include dyslipidemia, diabetes mellitus, hypertension and a sedentary lifestyle.  Hypertension This is a chronic problem. The current episode started more than 1 year ago. The problem is controlled. Pertinent negatives include no chest pain, headaches, palpitations or shortness of breath. Risk factors for coronary artery disease include diabetes mellitus, dyslipidemia, sedentary lifestyle and family history. Past treatments include ACE inhibitors and beta blockers.    Review of Systems  Constitutional: Negative for chills, fatigue, fever and unexpected weight change.  HENT: Negative for trouble swallowing and voice change.   Eyes: Negative for visual disturbance.  Respiratory: Negative for cough, shortness of breath and wheezing.   Cardiovascular: Negative for chest pain, palpitations and leg swelling.  Gastrointestinal: Negative for diarrhea, nausea and vomiting.  Endocrine: Negative for cold intolerance, heat intolerance, polydipsia, polyphagia and polyuria.  Musculoskeletal: Negative for arthralgias and myalgias.  Skin: Negative for color change, pallor, rash and wound.  Neurological: Negative for seizures and headaches.  Psychiatric/Behavioral: Negative for confusion and suicidal ideas.    Objective:    BP (!) 149/55   Pulse 65   Ht '5\' 3"'  (1.6 m)   Wt 162 lb 6.4 oz (73.7 kg)   BMI 28.77 kg/m    Wt Readings from Last 3 Encounters:  12/27/19 162 lb 6.4 oz (73.7 kg)  12/06/19 163 lb (73.9 kg)  12/02/19 160 lb (72.6 kg)    Physical Exam  Constitutional: She is oriented to person, place, and time. She appears well-developed.  HENT:  Head: Normocephalic and atraumatic.  Eyes: EOM are normal.  Neck: No tracheal deviation present. No thyromegaly present.  Pulmonary/Chest: Effort normal.  Abdominal: There is no abdominal tenderness. There is no guarding.  Musculoskeletal:        General: No edema. Normal range of motion.     Cervical back: Normal range of motion and neck supple.  Neurological: She is alert and  oriented to person, place, and time. No cranial nerve deficit. Coordination normal.  Hard of hearing, motor aphasia.  Skin: Skin is warm and dry. No rash noted. No erythema. No pallor.  Psychiatric: She has a normal mood and affect. Judgment normal.    CMP     Component Value Date/Time   NA 136 08/24/2019 1429   K 4.1 08/24/2019 1429   CL 98 08/24/2019 1429   CO2 24 08/24/2019 1429   GLUCOSE 122 (H) 08/24/2019 1429   GLUCOSE 184 (H) 08/07/2019 0310   BUN 8 08/24/2019 1429   CREATININE 0.59 08/24/2019 1429   CREATININE 0.54 03/02/2013 0931   CALCIUM 9.5 08/24/2019 1429   PROT 6.4 08/24/2019 1429   ALBUMIN 4.2 08/24/2019 1429   AST 21 08/24/2019 1429   ALT 16 08/24/2019 1429   ALKPHOS 55 08/24/2019 1429   BILITOT 0.5 08/24/2019 1429   GFRNONAA 88 08/24/2019 1429   GFRNONAA >89 03/02/2013 0931   GFRAA 102 08/24/2019 1429   GFRAA >89 03/02/2013 0931     Diabetic Labs (most recent): Lab Results  Component Value Date   HGBA1C 7.0 (A) 12/27/2019   HGBA1C 7.0 (H) 06/21/2019   HGBA1C 7.1 (H) 10/29/2018     Lipid Panel ( most recent) Lipid Panel     Component Value Date/Time   CHOL 179 06/21/2019 0943   CHOL 177 02/23/2013 0911   TRIG 57 08/04/2019 1650   TRIG 203 (H) 01/12/2015 0915   TRIG 293 (H) 02/23/2013 0911   HDL 45 06/21/2019 0943   HDL 40  01/12/2015 0915   HDL 48 02/23/2013 0911   CHOLHDL 4.0 06/21/2019 0943   LDLCALC 96 06/21/2019 0943   LDLCALC 119 (H) 10/06/2013 1012   LDLCALC 70 02/23/2013 0911      Assessment & Plan:   1. Uncontrolled type 2 diabetes mellitus with diabetic neuropathy, without long-term current use of insulin ([place])  - Patient has currently uncontrolled symptomatic type 2 DM since  79 years of age. -She is accompanied by her daughter, denies any hypoglycemia.  Her point-of-care A1c is 7%, generally improving from 9.3%.     Recent labs reviewed, showing normal renal function.   Her diabetes is complicated by referral neuropathy and patient remains at a high risk for more acute and chronic complications of diabetes which include CAD, CVA, CKD, retinopathy, and neuropathy. These are all discussed in detail with the patient.  - I have counseled the patient on diet management and weight loss, by adopting a carbohydrate restricted/protein rich diet.  - she  admits there is a room for improvement in her diet and drink choices. -  Suggestion is made for her to avoid simple carbohydrates  from her diet including Cakes, Sweet Desserts / Pastries, Ice Cream, Soda (diet and regular), Sweet Tea, Candies, Chips, Cookies, Sweet Pastries,  Store Bought Juices, Alcohol in Excess of  1-2 drinks a day, Artificial Sweeteners, Coffee Creamer, and "Sugar-free" Products. This will help patient to have stable blood glucose profile and potentially avoid unintended weight gain.   - I encouraged the patient to switch to  unprocessed or minimally processed complex starch and increased protein intake (animal or plant source), fruits, and vegetables.  - Patient is advised to stick to a routine mealtimes to eat 3 meals  a day and avoid unnecessary snacks ( to snack only to correct hypoglycemia).   - I have approached patient with the following individualized plan to manage diabetes and patient agrees:   -She  has maintained  reasonable control of diabetes.  -Given her cognitive limitations and motor aphasia, she will benefit from simplified treatment regimen.  She will not need insulin treatment for now.    -She is advised to continue Metformin 1000 mg p.o. twice daily after breakfast and after supper, as well as Januvia 50 mg p.o. daily with breakfast.  - Side effects and precautions discussed with her.  - Patient specific target  A1c;  LDL, HDL, Triglycerides, and  Waist Circumference were discussed in detail.  2) BP/HTN: Her blood pressure is not controlled to target.  She is advised to continue her current blood pressure medications including lisinopril 40 mg p.o. daily, metoprolol, and amlodipine.    3) Lipids/HPL: Uncontrolled but improving, LDL 102.  She is advised to continue Crestor 10 mg p.o. daily at bedtime.    4)  Weight/Diet: CDE Consult has been initiated , exercise, and detailed carbohydrates information provided.  5) hypothyroidism: Her previsit labs for thyroid are consistent with appropriate replacement.  She is advised to continue  levothyroxine to 75 mcg p.o. daily before breakfast.    - We discussed about the correct intake of her thyroid hormone, on empty stomach at fasting, with water, separated by at least 30 minutes from breakfast and other medications,  and separated by more than 4 hours from calcium, iron, multivitamins, acid reflux medications (PPIs). -Patient is made aware of the fact that thyroid hormone replacement is needed for life, dose to be adjusted by periodic monitoring of thyroid function tests.    6) Chronic Care/Health Maintenance:  -Patient is on ACEI/ARB and Statin medications and encouraged to continue to follow up with Ophthalmology, Podiatrist at least yearly or according to recommendations, and advised to  stay away from smoking. I have recommended yearly flu vaccine and pneumonia vaccination at least every 5 years; moderate intensity exercise for up to 150 minutes  weekly; and  sleep for at least 7 hours a day.  - I advised patient to maintain close follow up with Sharion Balloon, FNP for primary care needs.  - Time spent on this patient care encounter:  35 min, of which > 50% was spent in  counseling and the rest reviewing her blood glucose logs , discussing her hypoglycemia and hyperglycemia episodes, reviewing her current and  previous labs / studies  ( including abstraction from other facilities) and medications  doses and developing a  long term treatment plan and documenting her care.   Please refer to Patient Instructions for Blood Glucose Monitoring and Insulin/Medications Dosing Guide"  in media tab for additional information. Please  also refer to " Patient Self Inventory" in the Media  tab for reviewed elements of pertinent patient history.  Kristi Willis participated in the discussions, expressed understanding, and voiced agreement with the above plans.  All questions were answered to her satisfaction. she is encouraged to contact clinic should she have any questions or concerns prior to her return visit.   Follow up plan: - Return in about 4 months (around 04/28/2020) for Follow up with Pre-visit Labs, NV A1c in Office.  Glade Lloyd, MD Phone: [phone]  Fax: [phone]  -  This note was partially dictated with voice recognition software. Similar sounding words can be transcribed inadequately or may not  be corrected upon review.  12/27/2019, 2:38 PM

## 2019-12-29 ENCOUNTER — Ambulatory Visit: Payer: Medicare PPO | Admitting: Physical Therapy

## 2019-12-29 ENCOUNTER — Other Ambulatory Visit: Payer: Self-pay

## 2019-12-29 DIAGNOSIS — G8929 Other chronic pain: Secondary | ICD-10-CM

## 2019-12-29 DIAGNOSIS — M25612 Stiffness of left shoulder, not elsewhere classified: Secondary | ICD-10-CM

## 2019-12-29 DIAGNOSIS — M25512 Pain in left shoulder: Secondary | ICD-10-CM | POA: Diagnosis not present

## 2019-12-29 NOTE — Therapy (Signed)
[facility] [place], [place], [address] Phone: [phone]   Fax:  [phone]  Physical Therapy Treatment  Patient Details  Name: Kristi Willis MRN: [id number] Date of Birth: 02-03-1941 Referring Provider (PT): Joni Fears MD   Encounter Date: 12/29/2019  PT End of Session - 12/29/19 1029    Visit Number  12    Number of Visits  12    Date for PT Re-Evaluation  02/07/20    PT Start Time  0900    PT Stop Time  0950    PT Time Calculation (min)  50 min    Activity Tolerance  Patient tolerated treatment well    Behavior During Therapy  [facility] for tasks assessed/performed       Past Medical History:  Diagnosis Date  . Allergy   . Asthma   . Cancer ([place])    skin  . Diabetes mellitus   . GERD (gastroesophageal reflux disease)    hiatal hernia  . Hypercholesteremia   . Hypertension   . Neuromuscular disorder (HCC)    DM neuropathy  . Neuropathy   . SVT (supraventricular tachycardia) ([facility]CC)     Past Surgical History:  Procedure Laterality Date  . BREAST BIOPSY     left breast biopsy  . LUMBAR LAMINECTOMY  08/10  . SKIN CANCER EXCISION  2005  . SPINE SURGERY     lumbar laminectomy    There were no vitals filed for this visit.  Subjective Assessment - 12/29/19 1028    Subjective  COVID-19 screen performed prior to patient entering clinic.  No new complaint.    Pertinent History  Asthma, DM, GERD, HTN, SVT, neuropathy.    Patient Stated Goals  Use arm without pain.    Currently in Pain?  Yes    Pain Score  2     Pain Location  Shoulder    Pain Orientation  Left    Pain Descriptors / Indicators  Aching;Sore    Pain Type  Chronic pain    Pain Onset  More than a month ago                       [facility] Adult PT Treatment/Exercise - 12/29/19 0001      Exercises   Exercises  Shoulder      Shoulder Exercises: Pulleys   Flexion  5 minutes    Other Pulley Exercises  Seated UE ranger x  5 minutes.      Shoulder Exercises: ROM/Strengthening   UBE (Upper Arm Bike)  120 RPM's x 10 minutes.      Moist Heat Therapy   Number Minutes Moist Heat  20 Minutes    Moist Heat Location  --   Left shoulder.     Acupuncturist Location  Left shoulder.    Electrical Stimulation Action  Pre-mod.    Electrical Stimulation Parameters  80-150 Hz x 20 minutes.    Electrical Stimulation Goals  Pain      Manual Therapy   Manual Therapy  Passive ROM    Passive ROM  PROM to patient's left shoulder x 4 minutes including left shoulder ant/post cap stretching x 4 minutes.               PT Short Term Goals - 11/08/19 1145      PT SHORT TERM GOAL #1   Title  STG's=LTG's.        PT Long  Term Goals - 11/22/19 0951      PT LONG TERM GOAL #1   Title  Independent with a HEP.    Time  6    Period  Weeks    Status  On-going      PT LONG TERM GOAL #2   Title  Active left shoulder flexion to 145 degrees so the patient can easily reach overhead.    Baseline  130 degrees passively    Time  6    Period  Weeks    Status  On-going   AROM 106 degrees 11/22/19     PT LONG TERM GOAL #3   Title  Perform ADL's with pain not > 3/10.    Time  6    Period  Weeks    Status  On-going   5/10 11/22/19           Plan - 12/29/19 1028    Clinical Impression Statement  Patient with a consistently lowered left shoulder pain-level.       Patient will benefit from skilled therapeutic intervention in order to improve the following deficits and impairments:     Visit Diagnosis: Chronic left shoulder pain  Stiffness of left shoulder, not elsewhere classified     Problem List Patient Active Problem List   Diagnosis Date Noted  . Pain in left shoulder 11/03/2019  . Acute metabolic encephalopathy [id number]  . Type 2 diabetes mellitus ([place]) 08/05/2019  . Anemia 08/05/2019  . QT prolongation 08/05/2019  . Pneumonia 08/04/2019  . Hypothyroidism  02/12/2019  . Speech articulation disorder 09/01/2018  . Essential hypertension 10/03/2017  . GAD (generalized anxiety disorder) 07/03/2017  . Uncontrolled type 2 diabetes mellitus with diabetic neuropathy, without long-term current use of insulin ([place]) 09/27/2016  . Depression 03/29/2016  . Insomnia 07/18/2015  . Vitamin D deficiency 10/11/2013  . Right carotid bruit 03/02/2013  . Degenerative disc disease, lumbar 11/27/2012  . Asthma 11/13/2010  . Hyperlipidemia associated with type 2 diabetes mellitus ([place]) 11/13/2010  . Cancer of skin 11/13/2010  . Diabetic neuropathy ([place]) 11/13/2010  . Wide-complex tachycardia==Atrial Tachycardia 11/13/2010    Kobyn Kray, [place] MPT 12/29/2019, 10:56 AM  [facility] [address] [place], [place], [address] Phone: [phone]   Fax:  [phone]  Name: Kristi Willis MRN: [id number] Date of Birth: November 25, 1940

## 2019-12-30 DIAGNOSIS — J3089 Other allergic rhinitis: Secondary | ICD-10-CM | POA: Diagnosis not present

## 2019-12-30 DIAGNOSIS — J301 Allergic rhinitis due to pollen: Secondary | ICD-10-CM | POA: Diagnosis not present

## 2019-12-31 ENCOUNTER — Other Ambulatory Visit: Payer: Self-pay | Admitting: *Deleted

## 2019-12-31 MED ORDER — ROSUVASTATIN CALCIUM 20 MG PO TABS: 20.0000 mg | ORAL_TABLET | Freq: Every day | ORAL | 1 refills | Status: DC

## 2020-01-05 ENCOUNTER — Other Ambulatory Visit: Payer: Self-pay

## 2020-01-05 ENCOUNTER — Encounter: Payer: Self-pay | Admitting: Physical Therapy

## 2020-01-05 ENCOUNTER — Ambulatory Visit: Payer: Medicare PPO | Admitting: Physical Therapy

## 2020-01-05 DIAGNOSIS — G8929 Other chronic pain: Secondary | ICD-10-CM | POA: Diagnosis not present

## 2020-01-05 DIAGNOSIS — M25512 Pain in left shoulder: Secondary | ICD-10-CM | POA: Diagnosis not present

## 2020-01-05 DIAGNOSIS — M25612 Stiffness of left shoulder, not elsewhere classified: Secondary | ICD-10-CM | POA: Diagnosis not present

## 2020-01-05 NOTE — Patient Instructions (Addendum)
ROM: Flexion (Standing)    Bring arms straight out in front and raise as high as possible without pain. Keep palms facing up. Repeat _20___ times per set. Do __1-2__ sets per session. Do __1-2__ sessions per day.   Scapular Retraction: Elbow Flexion (Standing)    With elbows bent to 90, pinch shoulder blades together and rotate arms out, keeping elbows bent. Repeat _20__ times per set. Do __1-2__ sets per session. Do __1-2__ sessions per day.   Strengthening: Chest Pull - Resisted    With resistive band looped around each hand, and arms straight out in front, stretch band across chest. Repeat __10__ times per set. Do 1-2__ sets per session. Do __1-2__ sessions per day.

## 2020-01-05 NOTE — Therapy (Signed)
[facility] [place], [place], [address] Phone: [phone]   Fax:  [phone]  Physical Therapy Treatment  Patient Details  Name: Kristi Willis MRN: [id number] Date of Birth: 02/02/1941 Referring Provider (PT): Joni Fears MD   Encounter Date: 01/05/2020  PT End of Session - 01/05/20 0920    Visit Number  13    Number of Visits  12    Date for PT Re-Evaluation  02/07/20    PT Start Time  0901    PT Stop Time  0942    PT Time Calculation (min)  41 min    Activity Tolerance  Patient tolerated treatment well    Behavior During Therapy  [facility] for tasks assessed/performed       Past Medical History:  Diagnosis Date  . Allergy   . Asthma   . Cancer ([place])    skin  . Diabetes mellitus   . GERD (gastroesophageal reflux disease)    hiatal hernia  . Hypercholesteremia   . Hypertension   . Neuromuscular disorder (HCC)    DM neuropathy  . Neuropathy   . SVT (supraventricular tachycardia) (HCC)     Past Surgical History:  Procedure Laterality Date  . BREAST BIOPSY     left breast biopsy  . LUMBAR LAMINECTOMY  08/10  . SKIN CANCER EXCISION  2005  . SPINE SURGERY     lumbar laminectomy    There were no vitals filed for this visit.  Subjective Assessment - 01/05/20 0903    Subjective  COVID-19 screen performed prior to patient entering clinic.  Patient arrived with no new complaints.    Pertinent History  Asthma, DM, GERD, HTN, SVT, neuropathy.    Patient Stated Goals  Use arm without pain.    Currently in Pain?  Yes    Pain Score  --   no number given   Pain Location  Shoulder    Pain Orientation  Left    Pain Descriptors / Indicators  Discomfort    Pain Type  Chronic pain    Pain Onset  More than a month ago    Pain Frequency  Intermittent    Aggravating Factors   certain movements    Pain Relieving Factors  rest                        [facility] Adult PT Treatment/Exercise -  01/05/20 0001      Shoulder Exercises: Seated   Other Seated Exercises  seated for Bil scaption 2x10, scap retraction 2x10, horizontal abd with yellow t-band 2x10   reviewed all for HEP     Shoulder Exercises: Pulleys   Flexion  5 minutes   holds at top range   Other Pulley Exercises  Seated UE ranger for circles and elevation x 5 minutes.      Shoulder Exercises: ROM/Strengthening   UBE (Upper Arm Bike)  120 RPM's x 8 minutes.      Moist Heat Therapy   Number Minutes Moist Heat  15 Minutes    Moist Heat Location  Shoulder      Electrical Stimulation   Electrical Stimulation Location  Left shoulder.    Electrical Stimulation Action  premod    Electrical Stimulation Parameters  80-150hz x73mn    Electrical Stimulation Goals  Pain             PT Education - 01/05/20 0913    Education Details  HEP for home  progression    Person(s) Educated  Patient    Methods  Explanation;Demonstration;Handout    Comprehension  Verbalized understanding;Returned demonstration       PT Short Term Goals - 11/08/19 1145      PT SHORT TERM GOAL #1   Title  STG's=LTG's.        PT Long Term Goals - 01/05/20 0906      PT LONG TERM GOAL #1   Title  Independent with a HEP.    Baseline  issued and reviewd today 01/05/20    Time  6    Period  Weeks    Status  Achieved   01/05/20     PT LONG TERM GOAL #2   Title  Active left shoulder flexion to 145 degrees so the patient can easily reach overhead.    Baseline  AROM 133 degrees    Time  6    Period  Weeks    Status  Not Met   01/05/20     PT LONG TERM GOAL #3   Title  Perform ADL's with pain not > 3/10.    Baseline  Met 01/05/20    Time  6    Period  Weeks    Status  Achieved   01/05/20           Plan - 01/05/20 6546    Clinical Impression Statement  Patient tolerated treatment well today. Issued HEP for home progression. Patient reported living alone and able to perform all ADL's independently and with little to no  discomfort. Patient has improved AROM to 133 degrees flexion yet unable to meet ROM goal, Patient met LTG#1 and #3 today. DC to HEP.    Personal Factors and Comorbidities  Comorbidity 1;Comorbidity 2    Comorbidities  Asthma, DM, GERD, HTN, SVT, neuropathy.    Examination-Activity Limitations  Reach Overhead;Other    Examination-Participation Restrictions  Other    Stability/Clinical Decision Making  Evolving/Moderate complexity    Rehab Potential  Good    PT Frequency  2x / week    PT Duration  6 weeks    PT Treatment/Interventions  ADLs/Self Care Home Management;Cryotherapy;Electrical Stimulation;Ultrasound;Moist Heat;Iontophoresis 64m/ml Dexamethasone;Therapeutic activities;Therapeutic exercise;Manual techniques;Patient/family education;Passive range of motion;Dry needling;Joint Manipulations    PT Next Visit Plan  DC    Consulted and Agree with Plan of Care  Patient       Patient will benefit from skilled therapeutic intervention in order to improve the following deficits and impairments:  Pain, Decreased activity tolerance, Decreased range of motion, Decreased strength  Visit Diagnosis: Chronic left shoulder pain  Stiffness of left shoulder, not elsewhere classified     Problem List Patient Active Problem List   Diagnosis Date Noted  . Pain in left shoulder 11/03/2019  . Acute metabolic encephalopathy 150/35/4656 . Type 2 diabetes mellitus ([facility] 08/05/2019  . Anemia 08/05/2019  . QT prolongation 08/05/2019  . Pneumonia 08/04/2019  . Hypothyroidism 02/12/2019  . Speech articulation disorder 09/01/2018  . Essential hypertension 10/03/2017  . GAD (generalized anxiety disorder) 07/03/2017  . Uncontrolled type 2 diabetes mellitus with diabetic neuropathy, without long-term current use of insulin ([facility] 09/27/2016  . Depression 03/29/2016  . Insomnia 07/18/2015  . Vitamin D deficiency 10/11/2013  . Right carotid bruit 03/02/2013  . Degenerative disc disease, lumbar 11/27/2012   . Asthma 11/13/2010  . Hyperlipidemia associated with type 2 diabetes mellitus ([facility] 11/13/2010  . Cancer of skin 11/13/2010  . Diabetic neuropathy ([place] 11/13/2010  . Wide-complex tachycardia==Atrial  Tachycardia 11/13/2010   Ladean Raya, PTA 01/05/20 9:45 AM  [facility] [address] [place], [place], [address] Phone: [phone]   Fax:  [phone]  Name: LIZZIE AN MRN: [id number] Date of Birth: 15-May-1941  PHYSICAL THERAPY DISCHARGE SUMMARY  Visits from Start of Care: 13.  Current functional level related to goals / functional outcomes: See above.   Remaining deficits: See goal section.   Education / Equipment: HEP. Plan: Patient agrees to discharge.  Patient goals were partially met. Patient is being discharged due to meeting the stated rehab goals.  ?????         Mali Applegate MPT

## 2020-01-06 DIAGNOSIS — J3089 Other allergic rhinitis: Secondary | ICD-10-CM | POA: Diagnosis not present

## 2020-01-06 DIAGNOSIS — J301 Allergic rhinitis due to pollen: Secondary | ICD-10-CM | POA: Diagnosis not present

## 2020-01-11 ENCOUNTER — Other Ambulatory Visit: Payer: Self-pay | Admitting: Family

## 2020-01-11 DIAGNOSIS — B351 Tinea unguium: Secondary | ICD-10-CM | POA: Diagnosis not present

## 2020-01-11 DIAGNOSIS — J189 Pneumonia, unspecified organism: Secondary | ICD-10-CM

## 2020-01-11 DIAGNOSIS — L84 Corns and callosities: Secondary | ICD-10-CM | POA: Diagnosis not present

## 2020-01-11 DIAGNOSIS — E1142 Type 2 diabetes mellitus with diabetic polyneuropathy: Secondary | ICD-10-CM | POA: Diagnosis not present

## 2020-01-11 DIAGNOSIS — M79676 Pain in unspecified toe(s): Secondary | ICD-10-CM | POA: Diagnosis not present

## 2020-01-13 DIAGNOSIS — J301 Allergic rhinitis due to pollen: Secondary | ICD-10-CM | POA: Diagnosis not present

## 2020-01-13 DIAGNOSIS — J3089 Other allergic rhinitis: Secondary | ICD-10-CM | POA: Diagnosis not present

## 2020-01-21 ENCOUNTER — Other Ambulatory Visit (HOSPITAL_COMMUNITY)
Admission: RE | Admit: 2020-01-21 | Discharge: 2020-01-21 | Disposition: A | Discharge status: Home / Self Care | Payer: Medicare PPO | Source: Ambulatory Visit | Attending: Emergency Medicine | Admitting: Emergency Medicine

## 2020-01-21 ENCOUNTER — Other Ambulatory Visit: Payer: Self-pay

## 2020-01-21 DIAGNOSIS — Z01812 Encounter for preprocedural laboratory examination: Secondary | ICD-10-CM | POA: Insufficient documentation

## 2020-01-21 DIAGNOSIS — J301 Allergic rhinitis due to pollen: Secondary | ICD-10-CM | POA: Diagnosis not present

## 2020-01-21 DIAGNOSIS — Z20822 Contact with and (suspected) exposure to covid-19: Secondary | ICD-10-CM | POA: Insufficient documentation

## 2020-01-21 DIAGNOSIS — J3089 Other allergic rhinitis: Secondary | ICD-10-CM | POA: Diagnosis not present

## 2020-01-21 DIAGNOSIS — J3081 Allergic rhinitis due to animal (cat) (dog) hair and dander: Secondary | ICD-10-CM | POA: Diagnosis not present

## 2020-01-22 LAB — SARS CORONAVIRUS 2 (TAT 6-24 HRS): SARS Coronavirus 2: NEGATIVE

## 2020-01-24 ENCOUNTER — Other Ambulatory Visit: Payer: Self-pay

## 2020-01-24 ENCOUNTER — Ambulatory Visit (INDEPENDENT_AMBULATORY_CARE_PROVIDER_SITE_OTHER): Payer: Medicare PPO | Admitting: Emergency Medicine

## 2020-01-24 ENCOUNTER — Ambulatory Visit: Payer: Medicare PPO | Admitting: Emergency Medicine

## 2020-01-24 ENCOUNTER — Encounter: Payer: Self-pay | Admitting: Emergency Medicine

## 2020-01-24 DIAGNOSIS — J4520 Mild intermittent asthma, uncomplicated: Secondary | ICD-10-CM

## 2020-01-24 DIAGNOSIS — J189 Pneumonia, unspecified organism: Secondary | ICD-10-CM

## 2020-01-24 LAB — PULMONARY FUNCTION TEST
FEF 25-75 Post: 1.89 L/sec
FEF 25-75 Pre: 1.44 L/sec
FEF2575-%Change-Post: 31 %
FEF2575-%Pred-Post: 130 %
FEF2575-%Pred-Pre: 98 %
FEV1-%Change-Post: 2 %
FEV1-%Pred-Post: 88 %
FEV1-%Pred-Pre: 86 %
FEV1-Post: 1.7 L
FEV1-Pre: 1.66 L
FEV1FVC-%Change-Post: -2 %
FEV1FVC-%Pred-Pre: 107 %
FEV6-%Change-Post: 5 %
FEV6-%Pred-Post: 89 %
FEV6-%Pred-Pre: 85 %
FEV6-Post: 2.19 L
FEV6-Pre: 2.07 L
FEV6FVC-%Pred-Post: 105 %
FEV6FVC-%Pred-Pre: 105 %
FVC-%Change-Post: 5 %
FVC-%Pred-Post: 85 %
FVC-%Pred-Pre: 80 %
FVC-Post: 2.19 L
FVC-Pre: 2.07 L
Post FEV1/FVC ratio: 78 %
Post FEV6/FVC ratio: 100 %
Pre FEV1/FVC ratio: 80 %
Pre FEV6/FVC Ratio: 100 %

## 2020-01-24 NOTE — Patient Instructions (Signed)
Your breathing test still show evidence for mild asthma. Your chest x-ray from last visit shows that your right upper lobe pneumonia has resolved. Your swallowing test shows that you are low risk for aspiration, normal function for your age Please keep your albuterol available to use 2 puffs when needed for shortness of breath, chest tightness, wheezing. We will not start an every day scheduled inhaler at this time. You may benefit from seeing gastroenterology at some point in the future if you have more episodes of emesis, reflux COVID-19 vaccine up-to-date Follow with Dr. Lamonte Sakai in 12 months or sooner if you have any problems.   Marland Kitchen

## 2020-01-24 NOTE — Progress Notes (Signed)
Spirometry pre and post done today. 

## 2020-01-24 NOTE — Assessment & Plan Note (Signed)
Mild obstruction by spirometry based on curve of her flow volume loop.  Infrequent albuterol use.  Overall she is clinically better since she has recovered from her right upper lobe pneumonia.  I do not think she needs scheduled bronchodilator therapy at this time.  We will keep track of her albuterol use and if it increases then reconsider scheduled BD

## 2020-01-24 NOTE — Progress Notes (Signed)
Subjective:    Patient ID: Kristi Willis, female    DOB: Jul 28, 1941, 79 y.o.   MRN: [id number]  HPI 79 year old never smoker with a history of diabetes, allergic rhinitis on immunotherapy, GERD with a hiatal hernia, hypertension on an ACE inhibitor. Hx of apahsia, here with her daughter who assists with the history. She carries a history of asthma that was made over 40 years ago.  She has been under recent eval for chest discomfort and dyspnea. Was hospitalized in December for this. Found to have RUL infiltrate on CT chest, was treated for PNA, also with prednisone for associated obstruction. She needs pred about 4x a year, usually for wheeze, cough, SOB. She has noted some intermittent food getting stuck, choking a few times a week. Has to go urgently Has albuterol that she only needs when flaring - a few stretches per year.   ROV 01/24/20 --follow-up visit for never smoker with a history of longstanding asthma, history of dysphagia with choking.  Also questionable aspiration based on a recent right upper lobe pneumonia.  She underwent pulmonary function testing today which I have reviewed, shows overall normal airflows but with some evidence for mild obstruction based on curve of her flow volume loop.  No bronchodilator response.  She was unable to perform DLCO or lung volumes.  She reports today that she still has some regurgitation rarely, not every week. Rare albuterol use, does sometimes use it when she is exerting. She is able to work in the yard. She is recovering from the RUL PNA.   CXR from 12/07/2019 resolution of her right upper lobe infiltrate without any evidence of underlying abnormality MBS from 12/15/2019 shows trace flash penetration, normal for age, minimal a-P deficit which could lead to episodic aspiration, counseling was given and no diet recommendations made  Reviewed endocrinology visit 12/27/2019  Review of Systems  Constitutional: Positive for appetite change. Negative  for activity change, chills, diaphoresis, fatigue, fever and unexpected weight change.  HENT: Negative for congestion, dental problem, nosebleeds, postnasal drip, rhinorrhea, sinus pressure, sneezing, trouble swallowing and voice change.   Eyes: Negative for itching and visual disturbance.  Respiratory: Negative for cough, choking, chest tightness, shortness of breath, wheezing and stridor.   Cardiovascular: Positive for palpitations. Negative for chest pain and leg swelling.  Gastrointestinal: Negative for abdominal pain.       GERD Difficulty swallowing  Musculoskeletal: Negative for joint swelling and myalgias.  Skin: Negative for rash.  Neurological: Negative for syncope, light-headedness and headaches.  Psychiatric/Behavioral: Negative for sleep disturbance.        Objective:   Physical Exam Vitals:   01/24/20 1350  BP: 112/60  Pulse: 60  Temp: 98.3 F (36.8 C)  TempSrc: Oral  SpO2: 97%  Weight: 164 lb 3.2 oz (74.5 kg)  Height: 5\' 3"  (1.6 m)   Gen: Pleasant, well-nourished, in no distress,  normal affect  ENT: No lesions,  mouth clear,  oropharynx clear, no postnasal drip  Neck: No JVD, no stridor  Lungs: No use of accessory muscles, no crackles or wheezing on normal respiration, no wheeze on forced expiration  Cardiovascular: RRR, intact S1 and S2, soft diastolic M  Musculoskeletal: No deformities, no cyanosis or clubbing  Neuro: alert, awake, she only interacts with one-word responses, relies on her daughter to answer most questions.  History of unexplained aphasia  No weakness or other deficits noted  Skin: Warm, no lesions or rash      Assessment & Plan:  Asthma  Mild obstruction by spirometry based on curve of her flow volume loop.  Infrequent albuterol use.  Overall she is clinically better since she has recovered from her right upper lobe pneumonia.  I do not think she needs scheduled bronchodilator therapy at this time.  We will keep track of her albuterol  use and if it increases then reconsider scheduled BD  Pneumonia Resolved.  Suspect that this may have been an aspiration event due to her intermittent emesis, reflux.  She may need to see gastroenterology for this at some point in the future.  Her aspiration risk was deemed to be low on her MBS.  Baltazar Apo, MD, PhD 01/24/2020, 2:18 PM  Pulmonary and Critical Care [phone] or if no answer [phone]

## 2020-01-24 NOTE — Assessment & Plan Note (Signed)
Resolved.  Suspect that this may have been an aspiration event due to her intermittent emesis, reflux.  She may need to see gastroenterology for this at some point in the future.  Her aspiration risk was deemed to be low on her MBS.

## 2020-01-25 ENCOUNTER — Other Ambulatory Visit: Payer: Self-pay | Admitting: Family

## 2020-01-28 DIAGNOSIS — J301 Allergic rhinitis due to pollen: Secondary | ICD-10-CM | POA: Diagnosis not present

## 2020-01-28 DIAGNOSIS — J3089 Other allergic rhinitis: Secondary | ICD-10-CM | POA: Diagnosis not present

## 2020-02-03 DIAGNOSIS — J3089 Other allergic rhinitis: Secondary | ICD-10-CM | POA: Diagnosis not present

## 2020-02-03 DIAGNOSIS — J301 Allergic rhinitis due to pollen: Secondary | ICD-10-CM | POA: Diagnosis not present

## 2020-02-10 DIAGNOSIS — J301 Allergic rhinitis due to pollen: Secondary | ICD-10-CM | POA: Diagnosis not present

## 2020-02-10 DIAGNOSIS — J3089 Other allergic rhinitis: Secondary | ICD-10-CM | POA: Diagnosis not present

## 2020-02-18 DIAGNOSIS — J301 Allergic rhinitis due to pollen: Secondary | ICD-10-CM | POA: Diagnosis not present

## 2020-02-18 DIAGNOSIS — J3089 Other allergic rhinitis: Secondary | ICD-10-CM | POA: Diagnosis not present

## 2020-02-24 ENCOUNTER — Ambulatory Visit (INDEPENDENT_AMBULATORY_CARE_PROVIDER_SITE_OTHER): Payer: Medicare PPO | Admitting: *Deleted

## 2020-02-24 DIAGNOSIS — Z0000 Encounter for general adult medical examination without abnormal findings: Secondary | ICD-10-CM

## 2020-02-24 DIAGNOSIS — J301 Allergic rhinitis due to pollen: Secondary | ICD-10-CM | POA: Diagnosis not present

## 2020-02-24 DIAGNOSIS — J3089 Other allergic rhinitis: Secondary | ICD-10-CM | POA: Diagnosis not present

## 2020-02-24 NOTE — Progress Notes (Addendum)
MEDICARE ANNUAL WELLNESS VISIT  02/24/2020  Telephone Visit Disclaimer This Medicare AWV was conducted by telephone due to national recommendations for restrictions regarding the COVID-19 Pandemic (e.g. social distancing).  I verified, using two identifiers, that I am speaking with Kristi Willis or their authorized healthcare agent. I discussed the limitations, risks, security, and privacy concerns of performing an evaluation and management service by telephone and the potential availability of an in-person appointment in the future. The patient expressed understanding and agreed to proceed.   Subjective:  Kristi Willis is a 79 y.o. female patient of Hawks, Theador Hawthorne, FNP who had a Medicare Annual Wellness Visit today via telephone. Kristi Willis is Retired and lives alone. she has 1 children. she reports that she is not socially active and does interact with friends/family regularly. she is minimally physically active and enjoys shopping and great grandchildren.  Patient Care Team: Sharion Balloon, FNP as PCP - General (Family Medicine) Deboraha Sprang, MD as Consulting Physician (Cardiology) Monna Fam, MD as Consulting Physician (Ophthalmology) Cameron Sprang, MD as Consulting Physician (Neurology)  Advanced Directives 02/24/2020 11/08/2019 02/17/2019 11/21/2017 10/10/2016 06/05/2016 02/04/2016  Does Patient Have a Medical Advance Directive? Yes Yes Yes Yes No No No  Type of [employer] Power of [place];Living will - - -  Does patient want to make changes to medical advance directive? No - Patient declined - No - Patient declined No - Patient declined - - -  Copy of [place] in Chart? No - copy requested - No - copy requested No - copy requested - - -  Would patient like information on creating a medical advance directive? - - - - - No - patient declined information No - patient  declined information    Hospital Utilization Over the Past 12 Months: # of hospitalizations or ER visits: 1 # of surgeries: 0  Review of Systems    Patient reports that her overall health is better compared to last year.  History obtained from chart review and patient  Patient Reported Readings (BP, Pulse, CBG, Weight, etc) none  Pain Assessment Pain : No/denies pain     Current Medications & Allergies (verified) Allergies as of 02/24/2020       Reactions   Dimetapp C [phenylephrine-bromphen-codeine] Itching   Phenylephrine Hcl Other (See Comments)   unknown   Ultram [tramadol Hcl] Other (See Comments)   Insomnia   Celebrex [celecoxib] Rash   Cortisone Rash   Fenofibrate Rash   Sulfa Antibiotics Rash        Medication List        Accurate as of February 24, 2020 12:19 PM. If you have any questions, ask your nurse or doctor.          Accu-Chek Aviva Plus test strip Generic drug: glucose blood Check sugars daily & as needed Dx E11.9   Accu-Chek Aviva Plus w/Device Kit Check sugars daily & as needed Dx E11.9   Accu-Chek Softclix Lancets lancets Check sugars daily & as needed Dx E11.9   acetaminophen 325 MG tablet Commonly known as: TYLENOL Take 2 tablets (650 mg total) by mouth every 6 (six) hours as needed for mild pain or fever (or Fever >/= 101).   acyclovir ointment 5 % Commonly known as: ZOVIRAX APPLY TO THE AFFECTED AREA AROUND MOUTH EVERY 3 HOURS AS DIRECTED UNTIL CLEAR What changed: See the new instructions.   albuterol  108 (90 Base) MCG/ACT inhaler Commonly known as: Ventolin HFA Inhale 2 puffs into the lungs every 6 (six) hours as needed for wheezing.   amLODipine 5 MG tablet Commonly known as: NORVASC TAKE 1 TABLET DAILY.   aspirin 81 MG chewable tablet Chew 81 mg by mouth daily.   azelastine 0.1 % nasal spray Commonly known as: ASTELIN Place 2 sprays into both nostrils 2 (two) times daily.   Caltrate 600+D 600-400 MG-UNIT  tablet Generic drug: Calcium Carbonate-Vitamin D Take 1 tablet by mouth 2 (two) times daily.   doxazosin 8 MG tablet Commonly known as: CARDURA TAKE (1) TABLET DAILY FOR HIGH BLOOD PRESSURE. What changed: See the new instructions.   EpiPen 2-Pak 0.3 mg/0.3 mL Soaj injection Generic drug: EPINEPHrine Inject 0.3 mg into the muscle as needed for anaphylaxis. Reported on 01/24/2016   escitalopram 20 MG tablet Commonly known as: LEXAPRO Take 1 tablet (20 mg total) by mouth daily.   Fish Oil 1200 MG Caps Take 1 capsule by mouth daily.   furosemide 20 MG tablet Commonly known as: LASIX Take 1 tablet (20 mg total) by mouth daily as needed for fluid (Weight gain of 3 lbs in a day or 5 lbs in a week).   gabapentin 100 MG capsule Commonly known as: NEURONTIN Take 1 capsule (100 mg total) 3 (three) times daily by mouth.   ipratropium-albuterol 0.5-2.5 (3) MG/3ML Soln Commonly known as: DUONEB Take 3 mLs by nebulization 2 (two) times daily.   ketoconazole 2 % cream Commonly known as: NIZORAL APPLY TO AFFECTED AREASFON BOTTOMS OF FEET TWICE A DAY   levothyroxine 75 MCG tablet Commonly known as: SYNTHROID Take 1 tablet (75 mcg total) by mouth daily before breakfast.   lisinopril 40 MG tablet Commonly known as: ZESTRIL Take 1 tablet (40 mg total) by mouth daily. (Needs to be seen before next refill)   loratadine 10 MG tablet Commonly known as: CLARITIN Take 1 tablet (10 mg total) by mouth daily.   menthol-cetylpyridinium 3 MG lozenge Commonly known as: CEPACOL Take 1 lozenge (3 mg total) by mouth as needed for sore throat.   metFORMIN 1000 MG tablet Commonly known as: GLUCOPHAGE Take 1 tablet (1,000 mg total) by mouth 2 (two) times daily with a meal. (Needs to be seen before next refill)   metoprolol succinate 100 MG 24 hr tablet Commonly known as: TOPROL-XL Take 1 tablet (100 mg total) by mouth in the morning and at bedtime. (Needs to be seen before next refill)    montelukast 10 MG tablet Commonly known as: SINGULAIR Take 1 tablet (10 mg total) by mouth at bedtime.   multivitamin tablet Take 1 tablet by mouth daily.   naproxen 500 MG tablet Commonly known as: Naprosyn Take 1 tablet (500 mg total) by mouth 2 (two) times daily with a meal.   permethrin 5 % cream Commonly known as: ELIMITE Apply 1 application topically at bedtime. Apply form the chin down on days 1, 2, and 7 then wash upon waking up.   rosuvastatin 20 MG tablet Commonly known as: Crestor Take 1 tablet (20 mg total) by mouth at bedtime.   sitaGLIPtin 50 MG tablet Commonly known as: Januvia TAKE 1 TABLET DAILY   vitamin B-12 500 MCG tablet Commonly known as: CYANOCOBALAMIN Take 500 mcg by mouth daily.   VITAMIN B-6 PO Take 1 tablet by mouth 2 (two) times daily. Reported on 01/24/2016   zinc gluconate 50 MG tablet Take 50 mg by mouth daily.  History (reviewed): Past Medical History:  Diagnosis Date   Allergy    Asthma    Cancer ([place])    skin   Diabetes mellitus    GERD (gastroesophageal reflux disease)    hiatal hernia   Hypercholesteremia    Hypertension    Neuromuscular disorder ([facility])    DM neuropathy   Neuropathy    SVT (supraventricular tachycardia) (HCC)    Past Surgical History:  Procedure Laterality Date   BREAST BIOPSY     left breast biopsy   LUMBAR LAMINECTOMY  08/10   SKIN CANCER EXCISION  2005   SPINE SURGERY     lumbar laminectomy   Family History  Problem Relation Age of Onset   Cancer Mother 51       cervical cancer   Depression Mother    Heart disease Father    Stroke Father    Cancer Father        lung cancer   Heart disease Sister 37       MI   Hypertension Sister    Cancer Sister 6       breast cancer   Healthy Daughter    Social History   Socioeconomic History   Marital status: Widowed    Spouse name: Not on file   Number of children: 1   Years of education: Not on file   Highest education level: 12th  grade  Occupational History   Occupation: Retired from [employer] but still works part-time  Tobacco Use   Smoking status: Never Smoker   Smokeless tobacco: Never Used  Scientific laboratory technician Use: Never used  Substance and Sexual Activity   Alcohol use: No   Drug use: No   Sexual activity: Never  Other Topics Concern   Not on file  Social History Narrative   Not on file   Social Determinants of Health   Financial Resource Strain:    Difficulty of Paying Living Expenses:   Food Insecurity:    Worried About Charity fundraiser in the Last Year:    Arboriculturist in the Last Year:   Transportation Needs:    Film/video editor (Medical):    Lack of Transportation (Non-Medical):   Physical Activity:    Days of Exercise per Week:    Minutes of Exercise per Session:   Stress:    Feeling of Stress :   Social Connections:    Frequency of Communication with Friends and Family:    Frequency of Social Gatherings with Friends and Family:    Attends Religious Services:    Active Member of Clubs or Organizations:    Attends Archivist Meetings:    Marital Status:     Activities of Daily Living In your present state of health, do you have any difficulty performing the following activities: 02/24/2020 02/24/2020  Hearing? Y Y  Comment sometimes -  Vision? N -  Difficulty concentrating or making decisions? Y -  Comment occasionally, passed MMSE -  Walking or climbing stairs? N -  Dressing or bathing? N -  Doing errands, shopping? N -  Preparing Food and eating ? N -  Using the Toilet? N -  In the past six months, have you accidently leaked urine? N -  Do you have problems with loss of bowel control? N -  Managing your Medications? N -  Managing your Finances? N -  Housekeeping or managing your Housekeeping? N -  Some recent data  might be hidden    Patient Education/ Literacy How often do you need to have someone help you when you read instructions, pamphlets, or  other written materials from your doctor or pharmacy?: 2 - Rarely What is the last grade level you completed in school?: 12  Exercise Current Exercise Habits: Home exercise routine, Type of exercise: walking, Time (Minutes): 30, Frequency (Times/Week): 3, Weekly Exercise (Minutes/Week): 90, Intensity: Mild, Exercise limited by: None identified  Diet Patient reports consuming 2 meals a day and 1 snack(s) a day Patient reports that her primary diet is: Diabetic Patient reports that she does have regular access to food.   Depression Screen PHQ 2/9 Scores 02/24/2020 02/17/2019 10/29/2018 08/31/2018 07/06/2018 02/23/2018 02/04/2018  PHQ - 2 Score 0 0 2 0 0 0 0  PHQ- 9 Score - - 6 - - - -     Fall Risk Fall Risk  02/24/2020 02/17/2019 10/29/2018 08/31/2018 07/06/2018  Falls in the past year? 0 _0 0  Number falls in past yr: - 1 0 0 -  Injury with Fall? - 0 0 0 -  Risk for fall due to : - History of fall(s);Impaired balance/gait - - -     Objective:  Kristi Willis seemed alert and oriented and she participated appropriately during our telephone visit.  Blood Pressure Weight BMI  BP Readings from Last 3 Encounters:  01/24/20 112/60  12/27/19 (!) 149/55  12/06/19 134/72   Wt Readings from Last 3 Encounters:  01/24/20 164 lb 3.2 oz (74.5 kg)  12/27/19 162 lb 6.4 oz (73.7 kg)  12/06/19 163 lb (73.9 kg)   BMI Readings from Last 1 Encounters:  01/24/20 29.09 kg/m    *Unable to obtain current vital signs, weight, and BMI due to telephone visit type  Hearing/Vision  Kristi Willis did  seem to have difficulty with hearing/understanding during the telephone conversation Reports that she has not had a formal eye exam by an eye care professional within the past year Reports that she has not had a formal hearing evaluation within the past year *Unable to fully assess hearing and vision during telephone visit type  Cognitive Function: 6CIT Screen 02/24/2020 02/17/2019  What Year? 0 points 0 points   What month? 0 points 0 points  What time? 0 points 0 points  Count back from 20 0 points 0 points  Months in reverse 2 points 0 points  Repeat phrase 4 points 0 points  Total Score 6 0   (Normal:0-7, Significant for Dysfunction: >8)  Normal Cognitive Function Screening: Yes   Immunization & Health Maintenance Record Immunization History  Administered Date(s) Administered   Fluad Quad(high Dose 65+) 06/29/2019   Influenza, High Dose Seasonal PF 06/13/2016, 07/03/2017, 06/16/2018   Influenza,inj,Quad PF,6+ Mos 05/20/2013, 05/31/2014, 07/18/2015   Influenza-Unspecified 08/20/2011   Moderna SARS-COVID-2 Vaccination 09/23/2019, 10/22/2019   Pneumococcal Conjugate-13 05/31/2014   Pneumococcal Polysaccharide-23 07/22/2012   Tdap 02/02/2015    Health Maintenance  Topic Date Due   Hepatitis C Screening  Never done   OPHTHALMOLOGY EXAM  01/22/2018   DEXA SCAN  06/19/2018   FOOT EXAM  07/03/2018   INFLUENZA VACCINE  03/19/2020   HEMOGLOBIN A1C  06/28/2020   TETANUS/TDAP  02/01/2025   COVID-19 Vaccine  Completed   PNA vac Low Risk Adult  Completed       Assessment  This is a routine wellness examination for Kristi Willis.  Health Maintenance: Due or Overdue Health Maintenance Due  Topic Date Due  Hepatitis C Screening  Never done   OPHTHALMOLOGY EXAM  01/22/2018   DEXA SCAN  06/19/2018   FOOT EXAM  07/03/2018    Kristi Willis does not need a referral for Community Assistance: Care Management:   no Social Work:    no Prescription Assistance:  no Nutrition/Diabetes Education:  no   Plan:  Personalized Goals Goals Addressed             This Visit's Progress    Decrease soda or juice intake   Not on track    Decrease intake of diet mountain dew       Exercise 3x per week (30 min per time)   On track    Continue to walk 3 times a week for 30 minutes     Plan meals   On track    Try to eat vegetables, lean proteins, and limit amount of fried  foods      Prevent falls         Personalized Health Maintenance & Screening Recommendations  Bone densitometry screening  Lung Cancer Screening Recommended: no (Low Dose CT Chest recommended if Age 14-80 years, 30 pack-year currently smoking OR have quit w/in past 15 years) Hepatitis C Screening recommended: no HIV Screening recommended: no  Advanced Directives: Written information was not prepared per patient's request.  Referrals & Orders No orders of the defined types were placed in this encounter.   Follow-up Plan Follow-up with Sharion Balloon, FNP as planned Pt will need a foot exam on next visit. Pt is due for dexa scan Pt is due for diabetic eye exam, patient's daughter aware and will schedule. Pt remains independent with all ADL's, she still drives. Pt walks 3 to 4 times weekly for exercise. Pt has a Power of attorney, copy requested for our records. Hearing not good, she did have a hearing exam a couple years ago, but she would not wear aids. AWS printed and mailed to pt    I have personally reviewed and noted the following in the patients chart:   Medical and social history Use of alcohol, tobacco or illicit drugs  Current medications and supplements Functional ability and status Nutritional status Physical activity Advanced directives List of other physicians Hospitalizations, surgeries, and ER visits in previous 12 months Vitals Screenings to include cognitive, depression, and falls Referrals and appointments  In addition, I have reviewed and discussed with Kristi Willis certain preventive protocols, quality metrics, and best practice recommendations. A written personalized care plan for preventive services as well as general preventive health recommendations is available and can be mailed to the patient at her request.      Rana Snare, LPN  09/23/971  I have reviewed and agree with the above AWV documentation.   Evelina Dun, FNP

## 2020-02-29 DIAGNOSIS — J301 Allergic rhinitis due to pollen: Secondary | ICD-10-CM | POA: Diagnosis not present

## 2020-03-01 DIAGNOSIS — J301 Allergic rhinitis due to pollen: Secondary | ICD-10-CM | POA: Diagnosis not present

## 2020-03-01 DIAGNOSIS — J3089 Other allergic rhinitis: Secondary | ICD-10-CM | POA: Diagnosis not present

## 2020-03-06 ENCOUNTER — Other Ambulatory Visit: Payer: Self-pay | Admitting: "Endocrinology

## 2020-03-06 ENCOUNTER — Telehealth: Payer: Self-pay | Admitting: "Endocrinology

## 2020-03-06 ENCOUNTER — Other Ambulatory Visit: Payer: Self-pay | Admitting: Family

## 2020-03-06 NOTE — Telephone Encounter (Signed)
Pt's sister, Sunday Spillers left a VM that she learned from the pharmacy that her sister has not had her thyroid medication since January. Please call Sunday Spillers at [phone]

## 2020-03-06 NOTE — Telephone Encounter (Signed)
Last OV 03/2019. Last RF 30 day supply given today  ntbs for further refills

## 2020-03-06 NOTE — Telephone Encounter (Signed)
I want her to resume her prior dose of Levothyroxine 75 mcg and keep appointment with labs.

## 2020-03-06 NOTE — Telephone Encounter (Signed)
Left voicemail to advise of instructions.

## 2020-03-06 NOTE — Telephone Encounter (Signed)
I spoke with patients sister, she was in town helping her over the weekend and was advised that the patient had not been taking her thyroid medication for the past 2 months, she does have an active script on file at pharmacy, Pharmacist advised her to reach out to office to see if medication needled to be changed or is it okay for her to resume the present dosage?

## 2020-03-14 DIAGNOSIS — J3089 Other allergic rhinitis: Secondary | ICD-10-CM | POA: Diagnosis not present

## 2020-03-14 DIAGNOSIS — F802 Mixed receptive-expressive language disorder: Secondary | ICD-10-CM | POA: Diagnosis not present

## 2020-03-14 DIAGNOSIS — J3081 Allergic rhinitis due to animal (cat) (dog) hair and dander: Secondary | ICD-10-CM | POA: Diagnosis not present

## 2020-03-14 DIAGNOSIS — G3101 Pick's disease: Secondary | ICD-10-CM | POA: Diagnosis not present

## 2020-03-21 DIAGNOSIS — J3081 Allergic rhinitis due to animal (cat) (dog) hair and dander: Secondary | ICD-10-CM | POA: Diagnosis not present

## 2020-03-21 DIAGNOSIS — J301 Allergic rhinitis due to pollen: Secondary | ICD-10-CM | POA: Diagnosis not present

## 2020-03-21 DIAGNOSIS — J3089 Other allergic rhinitis: Secondary | ICD-10-CM | POA: Diagnosis not present

## 2020-03-23 ENCOUNTER — Encounter: Payer: Self-pay | Admitting: Family

## 2020-03-23 ENCOUNTER — Ambulatory Visit: Payer: Medicare PPO | Admitting: Family

## 2020-03-23 ENCOUNTER — Other Ambulatory Visit: Payer: Self-pay

## 2020-03-23 VITALS — BP 140/58 | HR 62 | Temp 97.7°F | Ht 63.0 in | Wt 164.6 lb

## 2020-03-23 DIAGNOSIS — F0280 Dementia in other diseases classified elsewhere without behavioral disturbance: Secondary | ICD-10-CM | POA: Diagnosis not present

## 2020-03-23 DIAGNOSIS — G3101 Pick's disease: Secondary | ICD-10-CM | POA: Diagnosis not present

## 2020-03-23 NOTE — Patient Instructions (Signed)
Aphasia Aphasia is a language disorder that affects the part of your brain that you use to communicate. Aphasia does not affect your intelligence, but you may have trouble:  Speaking.  Understanding speech.  Reading.  Writing. Some people with aphasia may also have trouble with memory or attention. Aphasia can happen to anyone at any age, but it is most common in older adults. What are the causes? This condition is caused by damage to the language centers of the brain. Damage may be caused by:  Stroke. This causes a disruption of blood flow to certain areas of the brain. Stroke is the most common cause of aphasia.  Traumatic brain injury (TBI).  Brain tumor.  Infection of the brain tissues.  Nervous system disease that gradually gets worse (progressiveneurological disorder), such as dementia or multiple sclerosis (MS).  Brain surgery. What are the signs or symptoms? Symptoms of this condition include:  Trouble finding the right words.  Difficulty expressing thoughts and needs through speech.  Using the wrong words, nonsense words, or jargon.  Talking in sentences that do not make sense or that are not grammatically correct.  Being unable to repeat back words and phrases.  Difficulty expressing ideas through writing.  Difficulty comprehending when reading.  Being unable to understand other people's speech.  Having trouble understanding numbers. The condition affects people differently. Symptoms may start suddenly or come on gradually, depending on the underlying cause. How is this diagnosed? This condition may be diagnosed based on a screening of your ability to communicate as soon as symptoms start, or are medically stable after a stroke or brain injury. Later, a more comprehensive assessment may be conducted either in the hospital or rehabilitation center. The assessment may test your ability to:  Use speech to communicate your personal needs.  Use muscles in your  mouth and throat for speaking and swallowing.  Express ideas with speech or other means of communication, such as hand gestures.  Make conversation with others across a variety of topics.  Hear and understand speech.  Understand and produce written material.  Manage memory and attention associated with communication. How is this treated? Treatment for this condition depends on your needs and abilities. The goal is to help restore your ability to communicate or find ways to manage communication challenges. Common treatments include:  Speech-language therapy. Part of this may include: ? Re-building intonation, sentence structure, and vocabulary. ? Learning other ways to communicate, such as using word books, communication boards, or special software programs. ? Learning to communicate with writing, sign language, or hand gestures. ? Using a combination of methods to communicate.  Working with family members. This may include: ? Learning ways to communicate. ? Emotional support.  Support groups.  Occupational therapy. This can help to find devices to assist with daily living activities. Treatment usually begins as soon as possible. It may begin while you are in the hospital and continue in a rehabilitation center or at home. In some cases, aphasia may improve quickly on its own. In other cases, recovery occurs more slowly over time. Follow these instructions at home:   Keep all follow-up visits as told by your health care provider. This is important.  Make sure you have a good support system at home.  Find a support group. This can help you connect with others who are going through the same thing.  Try the following tips while communicating: ? Use short, simple sentences. Ask family members to do the same. Sentences that require   one-word or short answers are easiest. ? Avoid distractions like background noise when trying to listen or talk. ? Try communicating with gestures,  pointing, writing, or drawing. ? Talk slowly. Ask family members to talk to you slowly. ? Maintain eye contact when communicating. Ask family members to do the same when communicating with you. ? Ask family members to give you time to respond or to engage in conversation with them. Contact a health care provider if:  Your symptoms change or get worse.  You are struggling with anxiety or depression. Get help right away if you have:  Any symptoms of a stroke. "BE FAST" is an easy way to remember the main warning signs of a stroke: ? B - Balance. Signs are dizziness, sudden trouble walking, or loss of balance. ? E - Eyes. Signs are trouble seeing or a sudden change in vision. ? F - Face. Signs are a sudden weakness or numbness of the face, or the face or eyelid drooping on one side. ? A - Arms. Signs are weakness or numbness in an arm. This happens suddenly and usually on one side of the body. ? S - Speech. Signs are sudden trouble speaking, slurred speech, or trouble understanding what people say. ? T - Time. Time to call emergency service. Write down what time symptoms started.  Other signs of a stroke, such as: ? A sudden, severe headache with no known cause. ? Nausea or vomiting. ? Seizure. Summary  Aphasia is a language disorder that results from damage to the part of your brain that helps you use language to communicate. Aphasia does not affect your intelligence, but may cause difficulty with speech, writing, reading, or understanding others.  In some cases, aphasia may improve quickly on its own. In other cases, recovery occurs more slowly over time.  Get help right away if you have symptoms of a stroke. This information is not intended to replace advice given to you by your health care provider. Make sure you discuss any questions you have with your health care provider. Document Revised: 09/05/2017 Document Reviewed: 09/02/2017 Elsevier Patient Education  2020 [employer]  

## 2020-03-23 NOTE — Progress Notes (Signed)
   Subjective:    Patient ID: Kristi Willis, female    DOB: Dec 22, 1940, 79 y.o.   MRN: [id number]  Chief Complaint  Patient presents with  . Referral    for [facility], training her for her speech     HPI Pt presents to the office today requesting a referral to [facility]. She has primary progressive aphasia and is followed by Neurologists every annually. She is followed by a speech therapy and has ordered a tobii dyanavox. This is a machine that helps with her speech and to help communicate. She and her daughter need training on this.    Review of Systems  All other systems reviewed and are negative.      Objective:   Physical Exam Vitals reviewed.  Constitutional:      General: She is not in acute distress.    Appearance: She is well-developed.  HENT:     Head: Normocephalic and atraumatic.     Right Ear: Tympanic membrane normal.     Left Ear: Tympanic membrane normal.  Eyes:     Pupils: Pupils are equal, round, and reactive to light.  Neck:     Thyroid: No thyromegaly.  Cardiovascular:     Rate and Rhythm: Normal rate and regular rhythm.     Heart sounds: Normal heart sounds. No murmur heard.   Pulmonary:     Effort: Pulmonary effort is normal. No respiratory distress.     Breath sounds: Normal breath sounds. No wheezing.  Abdominal:     General: Bowel sounds are normal. There is no distension.     Palpations: Abdomen is soft.     Tenderness: There is no abdominal tenderness.  Musculoskeletal:        General: No tenderness. Normal range of motion.     Cervical back: Normal range of motion and neck supple.  Skin:    General: Skin is warm and dry.  Neurological:     Mental Status: She is alert and oriented to person, place, and time.     Cranial Nerves: No cranial nerve deficit.     Deep Tendon Reflexes: Reflexes are normal and symmetric.  Psychiatric:        Speech: Noncommunicative: aphasia noted.        Behavior: Behavior normal.        Thought Content:  Thought content normal.        Judgment: Judgment normal.     BP (!) 140/58   Pulse 62   Temp 97.7 F (36.5 C) (Temporal)   Ht 5\' 3"  (1.6 m)   Wt 164 lb 9.6 oz (74.7 kg)   SpO2 95%   BMI 29.16 kg/m      Assessment & Plan:  Kristi Willis comes in today with chief complaint of Referral (for [facility], training her for her speech )   Diagnosis and orders addressed:  1. Primary progressive aphasia [facility]) Keep appt with Neurologists and speech therapy Continue to use note pad  Hopefully device will help her communicate  - Ambulatory referral to [place], FNP

## 2020-03-27 DIAGNOSIS — J3081 Allergic rhinitis due to animal (cat) (dog) hair and dander: Secondary | ICD-10-CM | POA: Diagnosis not present

## 2020-03-27 DIAGNOSIS — J301 Allergic rhinitis due to pollen: Secondary | ICD-10-CM | POA: Diagnosis not present

## 2020-03-27 DIAGNOSIS — J3089 Other allergic rhinitis: Secondary | ICD-10-CM | POA: Diagnosis not present

## 2020-03-28 ENCOUNTER — Telehealth: Payer: Self-pay | Admitting: Family

## 2020-03-28 DIAGNOSIS — M79676 Pain in unspecified toe(s): Secondary | ICD-10-CM | POA: Diagnosis not present

## 2020-03-28 DIAGNOSIS — E1142 Type 2 diabetes mellitus with diabetic polyneuropathy: Secondary | ICD-10-CM | POA: Diagnosis not present

## 2020-03-28 DIAGNOSIS — L84 Corns and callosities: Secondary | ICD-10-CM | POA: Diagnosis not present

## 2020-03-28 DIAGNOSIS — G3101 Pick's disease: Secondary | ICD-10-CM

## 2020-03-28 DIAGNOSIS — F0280 Dementia in other diseases classified elsewhere without behavioral disturbance: Secondary | ICD-10-CM

## 2020-03-28 DIAGNOSIS — B351 Tinea unguium: Secondary | ICD-10-CM | POA: Diagnosis not present

## 2020-03-28 NOTE — Telephone Encounter (Signed)
Speech therapy referral was to go to [facility] not [facility]

## 2020-03-28 NOTE — Telephone Encounter (Signed)
New referral placed.

## 2020-03-31 DIAGNOSIS — R4701 Aphasia: Secondary | ICD-10-CM | POA: Diagnosis not present

## 2020-03-31 DIAGNOSIS — F800 Phonological disorder: Secondary | ICD-10-CM | POA: Diagnosis not present

## 2020-03-31 DIAGNOSIS — F0280 Dementia in other diseases classified elsewhere without behavioral disturbance: Secondary | ICD-10-CM | POA: Diagnosis not present

## 2020-03-31 DIAGNOSIS — Z7984 Long term (current) use of oral hypoglycemic drugs: Secondary | ICD-10-CM | POA: Diagnosis not present

## 2020-03-31 DIAGNOSIS — E1140 Type 2 diabetes mellitus with diabetic neuropathy, unspecified: Secondary | ICD-10-CM | POA: Diagnosis not present

## 2020-03-31 DIAGNOSIS — J449 Chronic obstructive pulmonary disease, unspecified: Secondary | ICD-10-CM | POA: Diagnosis not present

## 2020-04-04 ENCOUNTER — Other Ambulatory Visit: Payer: Self-pay | Admitting: Family

## 2020-04-04 DIAGNOSIS — J449 Chronic obstructive pulmonary disease, unspecified: Secondary | ICD-10-CM | POA: Diagnosis not present

## 2020-04-04 DIAGNOSIS — R4701 Aphasia: Secondary | ICD-10-CM | POA: Diagnosis not present

## 2020-04-04 DIAGNOSIS — F800 Phonological disorder: Secondary | ICD-10-CM | POA: Diagnosis not present

## 2020-04-04 DIAGNOSIS — F0280 Dementia in other diseases classified elsewhere without behavioral disturbance: Secondary | ICD-10-CM | POA: Diagnosis not present

## 2020-04-04 DIAGNOSIS — Z7984 Long term (current) use of oral hypoglycemic drugs: Secondary | ICD-10-CM | POA: Diagnosis not present

## 2020-04-04 DIAGNOSIS — E1140 Type 2 diabetes mellitus with diabetic neuropathy, unspecified: Secondary | ICD-10-CM | POA: Diagnosis not present

## 2020-04-05 DIAGNOSIS — Z7984 Long term (current) use of oral hypoglycemic drugs: Secondary | ICD-10-CM | POA: Diagnosis not present

## 2020-04-05 DIAGNOSIS — F0280 Dementia in other diseases classified elsewhere without behavioral disturbance: Secondary | ICD-10-CM | POA: Diagnosis not present

## 2020-04-05 DIAGNOSIS — E1140 Type 2 diabetes mellitus with diabetic neuropathy, unspecified: Secondary | ICD-10-CM | POA: Diagnosis not present

## 2020-04-05 DIAGNOSIS — J449 Chronic obstructive pulmonary disease, unspecified: Secondary | ICD-10-CM | POA: Diagnosis not present

## 2020-04-05 DIAGNOSIS — R4701 Aphasia: Secondary | ICD-10-CM | POA: Diagnosis not present

## 2020-04-05 DIAGNOSIS — F800 Phonological disorder: Secondary | ICD-10-CM | POA: Diagnosis not present

## 2020-04-06 DIAGNOSIS — J301 Allergic rhinitis due to pollen: Secondary | ICD-10-CM | POA: Diagnosis not present

## 2020-04-06 DIAGNOSIS — J3081 Allergic rhinitis due to animal (cat) (dog) hair and dander: Secondary | ICD-10-CM | POA: Diagnosis not present

## 2020-04-06 DIAGNOSIS — J3089 Other allergic rhinitis: Secondary | ICD-10-CM | POA: Diagnosis not present

## 2020-04-10 DIAGNOSIS — F0280 Dementia in other diseases classified elsewhere without behavioral disturbance: Secondary | ICD-10-CM | POA: Diagnosis not present

## 2020-04-10 DIAGNOSIS — J301 Allergic rhinitis due to pollen: Secondary | ICD-10-CM | POA: Diagnosis not present

## 2020-04-10 DIAGNOSIS — J3081 Allergic rhinitis due to animal (cat) (dog) hair and dander: Secondary | ICD-10-CM | POA: Diagnosis not present

## 2020-04-10 DIAGNOSIS — E1140 Type 2 diabetes mellitus with diabetic neuropathy, unspecified: Secondary | ICD-10-CM | POA: Diagnosis not present

## 2020-04-10 DIAGNOSIS — J449 Chronic obstructive pulmonary disease, unspecified: Secondary | ICD-10-CM | POA: Diagnosis not present

## 2020-04-10 DIAGNOSIS — J3089 Other allergic rhinitis: Secondary | ICD-10-CM | POA: Diagnosis not present

## 2020-04-10 DIAGNOSIS — F800 Phonological disorder: Secondary | ICD-10-CM | POA: Diagnosis not present

## 2020-04-10 DIAGNOSIS — Z7984 Long term (current) use of oral hypoglycemic drugs: Secondary | ICD-10-CM | POA: Diagnosis not present

## 2020-04-10 DIAGNOSIS — R4701 Aphasia: Secondary | ICD-10-CM | POA: Diagnosis not present

## 2020-04-12 DIAGNOSIS — F0280 Dementia in other diseases classified elsewhere without behavioral disturbance: Secondary | ICD-10-CM | POA: Diagnosis not present

## 2020-04-12 DIAGNOSIS — Z7984 Long term (current) use of oral hypoglycemic drugs: Secondary | ICD-10-CM | POA: Diagnosis not present

## 2020-04-12 DIAGNOSIS — J449 Chronic obstructive pulmonary disease, unspecified: Secondary | ICD-10-CM | POA: Diagnosis not present

## 2020-04-12 DIAGNOSIS — E1140 Type 2 diabetes mellitus with diabetic neuropathy, unspecified: Secondary | ICD-10-CM | POA: Diagnosis not present

## 2020-04-12 DIAGNOSIS — R4701 Aphasia: Secondary | ICD-10-CM | POA: Diagnosis not present

## 2020-04-12 DIAGNOSIS — F800 Phonological disorder: Secondary | ICD-10-CM | POA: Diagnosis not present

## 2020-04-13 ENCOUNTER — Telehealth: Payer: Self-pay | Admitting: *Deleted

## 2020-04-13 DIAGNOSIS — H9193 Unspecified hearing loss, bilateral: Secondary | ICD-10-CM

## 2020-04-13 NOTE — Telephone Encounter (Signed)
Referral to Audiologists placed.

## 2020-04-13 NOTE — Telephone Encounter (Signed)
[place] the Astronomer from [facility] called saying she is recommending referral to audiology for the pt. If you agree and place the referral pleas contact pt's daughter Kristi Willis.

## 2020-04-18 DIAGNOSIS — E1140 Type 2 diabetes mellitus with diabetic neuropathy, unspecified: Secondary | ICD-10-CM | POA: Diagnosis not present

## 2020-04-18 DIAGNOSIS — R4701 Aphasia: Secondary | ICD-10-CM | POA: Diagnosis not present

## 2020-04-18 DIAGNOSIS — Z7984 Long term (current) use of oral hypoglycemic drugs: Secondary | ICD-10-CM | POA: Diagnosis not present

## 2020-04-18 DIAGNOSIS — J449 Chronic obstructive pulmonary disease, unspecified: Secondary | ICD-10-CM | POA: Diagnosis not present

## 2020-04-18 DIAGNOSIS — F0280 Dementia in other diseases classified elsewhere without behavioral disturbance: Secondary | ICD-10-CM | POA: Diagnosis not present

## 2020-04-18 DIAGNOSIS — F800 Phonological disorder: Secondary | ICD-10-CM | POA: Diagnosis not present

## 2020-04-19 DIAGNOSIS — J301 Allergic rhinitis due to pollen: Secondary | ICD-10-CM | POA: Diagnosis not present

## 2020-04-19 DIAGNOSIS — J3081 Allergic rhinitis due to animal (cat) (dog) hair and dander: Secondary | ICD-10-CM | POA: Diagnosis not present

## 2020-04-19 DIAGNOSIS — J3089 Other allergic rhinitis: Secondary | ICD-10-CM | POA: Diagnosis not present

## 2020-04-21 ENCOUNTER — Other Ambulatory Visit: Payer: Self-pay | Admitting: Family

## 2020-04-21 DIAGNOSIS — Z7984 Long term (current) use of oral hypoglycemic drugs: Secondary | ICD-10-CM | POA: Diagnosis not present

## 2020-04-21 DIAGNOSIS — J449 Chronic obstructive pulmonary disease, unspecified: Secondary | ICD-10-CM | POA: Diagnosis not present

## 2020-04-21 DIAGNOSIS — E1140 Type 2 diabetes mellitus with diabetic neuropathy, unspecified: Secondary | ICD-10-CM | POA: Diagnosis not present

## 2020-04-21 DIAGNOSIS — F800 Phonological disorder: Secondary | ICD-10-CM | POA: Diagnosis not present

## 2020-04-21 DIAGNOSIS — F0280 Dementia in other diseases classified elsewhere without behavioral disturbance: Secondary | ICD-10-CM | POA: Diagnosis not present

## 2020-04-21 DIAGNOSIS — R4701 Aphasia: Secondary | ICD-10-CM | POA: Diagnosis not present

## 2020-04-25 DIAGNOSIS — R4701 Aphasia: Secondary | ICD-10-CM | POA: Diagnosis not present

## 2020-04-25 DIAGNOSIS — E1140 Type 2 diabetes mellitus with diabetic neuropathy, unspecified: Secondary | ICD-10-CM | POA: Diagnosis not present

## 2020-04-25 DIAGNOSIS — Z7984 Long term (current) use of oral hypoglycemic drugs: Secondary | ICD-10-CM | POA: Diagnosis not present

## 2020-04-25 DIAGNOSIS — F0280 Dementia in other diseases classified elsewhere without behavioral disturbance: Secondary | ICD-10-CM | POA: Diagnosis not present

## 2020-04-25 DIAGNOSIS — J449 Chronic obstructive pulmonary disease, unspecified: Secondary | ICD-10-CM | POA: Diagnosis not present

## 2020-04-25 DIAGNOSIS — J301 Allergic rhinitis due to pollen: Secondary | ICD-10-CM | POA: Diagnosis not present

## 2020-04-25 DIAGNOSIS — F800 Phonological disorder: Secondary | ICD-10-CM | POA: Diagnosis not present

## 2020-04-27 ENCOUNTER — Other Ambulatory Visit: Payer: Self-pay

## 2020-04-27 ENCOUNTER — Ambulatory Visit (INDEPENDENT_AMBULATORY_CARE_PROVIDER_SITE_OTHER): Payer: Medicare PPO

## 2020-04-27 DIAGNOSIS — Z7984 Long term (current) use of oral hypoglycemic drugs: Secondary | ICD-10-CM | POA: Diagnosis not present

## 2020-04-27 DIAGNOSIS — E1140 Type 2 diabetes mellitus with diabetic neuropathy, unspecified: Secondary | ICD-10-CM | POA: Diagnosis not present

## 2020-04-27 DIAGNOSIS — F0280 Dementia in other diseases classified elsewhere without behavioral disturbance: Secondary | ICD-10-CM

## 2020-04-27 DIAGNOSIS — R4701 Aphasia: Secondary | ICD-10-CM

## 2020-04-27 DIAGNOSIS — J449 Chronic obstructive pulmonary disease, unspecified: Secondary | ICD-10-CM | POA: Diagnosis not present

## 2020-04-27 DIAGNOSIS — F800 Phonological disorder: Secondary | ICD-10-CM | POA: Diagnosis not present

## 2020-05-01 DIAGNOSIS — F800 Phonological disorder: Secondary | ICD-10-CM | POA: Diagnosis not present

## 2020-05-01 DIAGNOSIS — E1140 Type 2 diabetes mellitus with diabetic neuropathy, unspecified: Secondary | ICD-10-CM | POA: Diagnosis not present

## 2020-05-01 DIAGNOSIS — R4701 Aphasia: Secondary | ICD-10-CM | POA: Diagnosis not present

## 2020-05-01 DIAGNOSIS — F0280 Dementia in other diseases classified elsewhere without behavioral disturbance: Secondary | ICD-10-CM | POA: Diagnosis not present

## 2020-05-01 DIAGNOSIS — Z7984 Long term (current) use of oral hypoglycemic drugs: Secondary | ICD-10-CM | POA: Diagnosis not present

## 2020-05-01 DIAGNOSIS — J449 Chronic obstructive pulmonary disease, unspecified: Secondary | ICD-10-CM | POA: Diagnosis not present

## 2020-05-02 ENCOUNTER — Other Ambulatory Visit: Payer: Self-pay | Admitting: Nurse Practitioner

## 2020-05-02 ENCOUNTER — Other Ambulatory Visit: Payer: Self-pay

## 2020-05-02 ENCOUNTER — Ambulatory Visit: Payer: Medicare PPO | Admitting: Nurse Practitioner

## 2020-05-02 ENCOUNTER — Other Ambulatory Visit: Payer: Medicare PPO

## 2020-05-02 DIAGNOSIS — F800 Phonological disorder: Secondary | ICD-10-CM | POA: Diagnosis not present

## 2020-05-02 DIAGNOSIS — E1140 Type 2 diabetes mellitus with diabetic neuropathy, unspecified: Secondary | ICD-10-CM | POA: Diagnosis not present

## 2020-05-02 DIAGNOSIS — R4701 Aphasia: Secondary | ICD-10-CM | POA: Diagnosis not present

## 2020-05-02 DIAGNOSIS — E039 Hypothyroidism, unspecified: Secondary | ICD-10-CM | POA: Diagnosis not present

## 2020-05-02 DIAGNOSIS — F0280 Dementia in other diseases classified elsewhere without behavioral disturbance: Secondary | ICD-10-CM | POA: Diagnosis not present

## 2020-05-02 DIAGNOSIS — E1165 Type 2 diabetes mellitus with hyperglycemia: Secondary | ICD-10-CM | POA: Diagnosis not present

## 2020-05-02 DIAGNOSIS — J449 Chronic obstructive pulmonary disease, unspecified: Secondary | ICD-10-CM | POA: Diagnosis not present

## 2020-05-02 DIAGNOSIS — Z7984 Long term (current) use of oral hypoglycemic drugs: Secondary | ICD-10-CM | POA: Diagnosis not present

## 2020-05-03 ENCOUNTER — Other Ambulatory Visit: Payer: Self-pay | Admitting: Family

## 2020-05-03 LAB — COMPREHENSIVE METABOLIC PANEL
ALT: 20 IU/L (ref 0–32)
AST: 20 IU/L (ref 0–40)
Albumin/Globulin Ratio: 1.9 (ref 1.2–2.2)
Albumin: 4.3 g/dL (ref 3.7–4.7)
Alkaline Phosphatase: 54 IU/L (ref 44–121)
BUN/Creatinine Ratio: 23 (ref 12–28)
BUN: 14 mg/dL (ref 8–27)
Bilirubin Total: 0.3 mg/dL (ref 0.0–1.2)
CO2: 22 mmol/L (ref 20–29)
Calcium: 9.3 mg/dL (ref 8.7–10.3)
Chloride: 100 mmol/L (ref 96–106)
Creatinine, Ser: 0.61 mg/dL (ref 0.57–1.00)
GFR calc Af Amer: 100 mL/min/{1.73_m2} (ref >59)
GFR calc non Af Amer: 87 mL/min/{1.73_m2} (ref >59)
Globulin, Total: 2.3 g/dL (ref 1.5–4.5)
Glucose: 160 mg/dL — ABNORMAL HIGH (ref 65–99)
Potassium: 4.5 mmol/L (ref 3.5–5.2)
Sodium: 139 mmol/L (ref 134–144)
Total Protein: 6.6 g/dL (ref 6.0–8.5)

## 2020-05-03 LAB — LIPID PANEL
Chol/HDL Ratio: 2.9 ratio (ref 0.0–4.4)
Cholesterol, Total: 115 mg/dL (ref 100–199)
HDL: 40 mg/dL (ref >39)
LDL Chol Calc (NIH): 46 mg/dL (ref 0–99)
Triglycerides: 174 mg/dL — ABNORMAL HIGH (ref 0–149)
VLDL Cholesterol Cal: 29 mg/dL (ref 5–40)

## 2020-05-03 LAB — T4, FREE: Free T4: 1.36 ng/dL (ref 0.82–1.77)

## 2020-05-03 LAB — MICROALBUMIN / CREATININE URINE RATIO
Creatinine, Urine: 57.6 mg/dL
Microalb/Creat Ratio: 41 mg/g creat — ABNORMAL HIGH (ref 0–29)
Microalbumin, Urine: 23.8 ug/mL

## 2020-05-03 LAB — TSH: TSH: 1.05 u[IU]/mL (ref 0.450–4.500)

## 2020-05-04 DIAGNOSIS — J301 Allergic rhinitis due to pollen: Secondary | ICD-10-CM | POA: Diagnosis not present

## 2020-05-04 DIAGNOSIS — J3081 Allergic rhinitis due to animal (cat) (dog) hair and dander: Secondary | ICD-10-CM | POA: Diagnosis not present

## 2020-05-04 DIAGNOSIS — J3089 Other allergic rhinitis: Secondary | ICD-10-CM | POA: Diagnosis not present

## 2020-05-09 ENCOUNTER — Encounter: Payer: Self-pay | Admitting: Nurse Practitioner

## 2020-05-09 ENCOUNTER — Telehealth (INDEPENDENT_AMBULATORY_CARE_PROVIDER_SITE_OTHER): Payer: Medicare PPO | Admitting: Nurse Practitioner

## 2020-05-09 DIAGNOSIS — I10 Essential (primary) hypertension: Secondary | ICD-10-CM | POA: Diagnosis not present

## 2020-05-09 DIAGNOSIS — F0280 Dementia in other diseases classified elsewhere without behavioral disturbance: Secondary | ICD-10-CM | POA: Diagnosis not present

## 2020-05-09 DIAGNOSIS — E559 Vitamin D deficiency, unspecified: Secondary | ICD-10-CM

## 2020-05-09 DIAGNOSIS — E1140 Type 2 diabetes mellitus with diabetic neuropathy, unspecified: Secondary | ICD-10-CM | POA: Diagnosis not present

## 2020-05-09 DIAGNOSIS — F800 Phonological disorder: Secondary | ICD-10-CM | POA: Diagnosis not present

## 2020-05-09 DIAGNOSIS — R4701 Aphasia: Secondary | ICD-10-CM | POA: Diagnosis not present

## 2020-05-09 DIAGNOSIS — E039 Hypothyroidism, unspecified: Secondary | ICD-10-CM

## 2020-05-09 DIAGNOSIS — J449 Chronic obstructive pulmonary disease, unspecified: Secondary | ICD-10-CM | POA: Diagnosis not present

## 2020-05-09 DIAGNOSIS — E1165 Type 2 diabetes mellitus with hyperglycemia: Secondary | ICD-10-CM | POA: Diagnosis not present

## 2020-05-09 DIAGNOSIS — E782 Mixed hyperlipidemia: Secondary | ICD-10-CM

## 2020-05-09 DIAGNOSIS — Z7984 Long term (current) use of oral hypoglycemic drugs: Secondary | ICD-10-CM | POA: Diagnosis not present

## 2020-05-09 MED ORDER — ROSUVASTATIN CALCIUM 20 MG PO TABS: 20.0000 mg | ORAL_TABLET | Freq: Every day | ORAL | 3 refills | Status: DC

## 2020-05-09 MED ORDER — LEVOTHYROXINE SODIUM 75 MCG PO TABS: 75.0000 ug | ORAL_TABLET | Freq: Every day | ORAL | 3 refills | Status: DC

## 2020-05-09 MED ORDER — METFORMIN HCL 1000 MG PO TABS: ORAL_TABLET | ORAL | 3 refills | Status: DC

## 2020-05-09 MED ORDER — SITAGLIPTIN PHOSPHATE 50 MG PO TABS: ORAL_TABLET | ORAL | 3 refills | Status: DC

## 2020-05-09 NOTE — Patient Instructions (Signed)

## 2020-05-09 NOTE — Progress Notes (Signed)
05/09/2020  Endocrinology follow-up note   TELEHEALTH VISIT: The patient is being engaged in telehealth visit due to COVID-19.  This type of visit limits physical examination significantly, and thus is not preferable over face-to-face encounters.  I connected with  Kristi Willis on 05/09/20 by a video enabled telemedicine application and verified that I am speaking with the correct person using two identifiers.   I discussed the limitations of evaluation and management by telemedicine. The patient expressed understanding and agreed to proceed.   The participants involved in this visit include: Brita Romp, NP located at [facility] and Kristi Willis  located at their personal residence listed.  Subjective:    Patient ID: Kristi Willis, female    DOB: 11-29-1940.  She is being seen in follow-up  for  management of type 2 diabetes, hyperlipidemia, hypertension. PCP: Sharion Balloon, FNP  Past Medical History:  Diagnosis Date   Allergy    Asthma    Cancer ([place])    skin   Diabetes mellitus    GERD (gastroesophageal reflux disease)    hiatal hernia   Hypercholesteremia    Hypertension    Neuromuscular disorder ([place])    DM neuropathy   Neuropathy    SVT (supraventricular tachycardia) ([place])    Past Surgical History:  Procedure Laterality Date   BREAST BIOPSY     left breast biopsy   LUMBAR LAMINECTOMY  08/10   SKIN CANCER EXCISION  2005   SPINE SURGERY     lumbar laminectomy   Social History   Socioeconomic History   Marital status: Widowed    Spouse name: Not on file   Number of children: 1   Years of education: Not on file   Highest education level: 12th grade  Occupational History   Occupation: Retired from [employer] but still works part-time  Tobacco Use   Smoking status: Never Smoker   Smokeless tobacco: Never Used  Scientific laboratory technician Use: Never used  Substance and Sexual Activity   Alcohol  use: No   Drug use: No   Sexual activity: Never  Other Topics Concern   Not on file  Social History Narrative   Not on file   Social Determinants of Health   Financial Resource Strain:    Difficulty of Paying Living Expenses: Not on file  Food Insecurity:    Worried About Charity fundraiser in the Last Year: Not on file   [place] in the Last Year: Not on file  Transportation Needs:    Lack of Transportation (Medical): Not on file   Lack of Transportation (Non-Medical): Not on file  Physical Activity:    Days of Exercise per Week: Not on file   Minutes of Exercise per Session: Not on file  Stress:    Feeling of Stress : Not on file  Social Connections:    Frequency of Communication with Friends and Family: Not on file   Frequency of Social Gatherings with Friends and Family: Not on file   Attends Religious Services: Not on file   Active Member of Clubs or Organizations: Not on file   Attends Club or Organization Meetings: Not on file   Marital Status: Not on file   Outpatient Encounter Medications as of 05/09/2020  Medication Sig   acetaminophen (TYLENOL) 325 MG tablet Take 2 tablets (650 mg total) by mouth every 6 (six) hours as needed for mild pain or fever (or Fever >/=  101).   acyclovir ointment (ZOVIRAX) 5 % APPLY TO THE AFFECTED AREA AROUND MOUTH EVERY 3 HOURS AS DIRECTED UNTIL CLEAR (Patient taking differently: Apply 1 application topically every 3 (three) hours. Using as needed for cold sores)   albuterol (VENTOLIN HFA) 108 (90 BASE) MCG/ACT inhaler Inhale 2 puffs into the lungs every 6 (six) hours as needed for wheezing.   amLODipine (NORVASC) 5 MG tablet TAKE 1 TABLET DAILY.   aspirin 81 MG chewable tablet Chew 81 mg by mouth daily.   azelastine (ASTELIN) 0.1 % nasal spray Place 2 sprays into both nostrils 2 (two) times daily.   Blood Glucose Monitoring Suppl (ACCU-CHEK AVIVA PLUS) w/Device KIT Check sugars daily & as needed Dx E11.9    Calcium Carbonate-Vitamin D (CALTRATE 600+D) 600-400 MG-UNIT per tablet Take 1 tablet by mouth 2 (two) times daily.   doxazosin (CARDURA) 8 MG tablet TAKE (1) TABLET DAILY FOR HIGH BLOOD PRESSURE. (Patient taking differently: Take 8 mg by mouth daily. )   EPIPEN 2-PAK 0.3 MG/0.3ML SOAJ injection Inject 0.3 mg into the muscle as needed for anaphylaxis. Reported on 01/24/2016   escitalopram (LEXAPRO) 20 MG tablet TAKE 1 TABLET DAILY   gabapentin (NEURONTIN) 100 MG capsule Take 1 capsule (100 mg total) 3 (three) times daily by mouth.   glucose blood (ACCU-CHEK AVIVA PLUS) test strip Check sugars daily & as needed Dx E11.9   ipratropium-albuterol (DUONEB) 0.5-2.5 (3) MG/3ML SOLN Take 3 mLs by nebulization 2 (two) times daily.   ketoconazole (NIZORAL) 2 % cream APPLY TO AFFECTED AREASFON BOTTOMS OF FEET TWICE A DAY   levothyroxine (SYNTHROID) 75 MCG tablet Take 1 tablet (75 mcg total) by mouth daily before breakfast.   lisinopril (ZESTRIL) 40 MG tablet Take 1 tablet (40 mg total) by mouth daily.   loratadine (CLARITIN) 10 MG tablet Take 1 tablet (10 mg total) by mouth daily.   menthol-cetylpyridinium (CEPACOL) 3 MG lozenge Take 1 lozenge (3 mg total) by mouth as needed for sore throat.   metFORMIN (GLUCOPHAGE) 1000 MG tablet TAKE  (1)  TABLET TWICE A DAY WITH MEALS (BREAKFAST AND SUPPER)   metoprolol succinate (TOPROL-XL) 100 MG 24 hr tablet TAKE (1) TABLET TWICE A DAY.   montelukast (SINGULAIR) 10 MG tablet Take 1 tablet (10 mg total) by mouth at bedtime.   Multiple Vitamin (MULTIVITAMIN) tablet Take 1 tablet by mouth daily.     naproxen (NAPROSYN) 500 MG tablet Take 1 tablet (500 mg total) by mouth 2 (two) times daily with a meal.   Omega-3 Fatty Acids (FISH OIL) 1200 MG CAPS Take 1 capsule by mouth daily.    Pyridoxine HCl (VITAMIN B-6 PO) Take 1 tablet by mouth 2 (two) times daily. Reported on 01/24/2016   rosuvastatin (CRESTOR) 20 MG tablet Take 1 tablet (20 mg total) by mouth  at bedtime.   sitaGLIPtin (JANUVIA) 50 MG tablet TAKE 1 TABLET DAILY   vitamin B-12 (CYANOCOBALAMIN) 500 MCG tablet Take 500 mcg by mouth daily.     zinc gluconate 50 MG tablet Take 50 mg by mouth daily.   [DISCONTINUED] levothyroxine (SYNTHROID) 75 MCG tablet Take 1 tablet (75 mcg total) by mouth daily before breakfast.   [DISCONTINUED] metFORMIN (GLUCOPHAGE) 1000 MG tablet TAKE  (1)  TABLET TWICE A DAY WITH MEALS (BREAKFAST AND SUPPER)   [DISCONTINUED] rosuvastatin (CRESTOR) 20 MG tablet Take 1 tablet (20 mg total) by mouth at bedtime.   [DISCONTINUED] sitaGLIPtin (JANUVIA) 50 MG tablet TAKE 1 TABLET DAILY   Accu-Chek Softclix Lancets  lancets Check sugars daily & as needed Dx E11.9   furosemide (LASIX) 20 MG tablet Take 1 tablet (20 mg total) by mouth daily as needed for fluid (Weight gain of 3 lbs in a day or 5 lbs in a week).   [DISCONTINUED] permethrin (ELIMITE) 5 % cream Apply 1 application topically at bedtime. Apply form the chin down on days 1, 2, and 7 then wash upon waking up.   No facility-administered encounter medications on file as of 05/09/2020.   ALLERGIES: Allergies  Allergen Reactions   Dimetapp C [Phenylephrine-Bromphen-Codeine] Itching   Phenylephrine Hcl Other (See Comments)    unknown   Ultram [Tramadol Hcl] Other (See Comments)    Insomnia   Celebrex [Celecoxib] Rash   Cortisone Rash   Fenofibrate Rash   Sulfa Antibiotics Rash   VACCINATION STATUS: Immunization History  Administered Date(s) Administered   Fluad Quad(high Dose 65+) 06/29/2019   Influenza, High Dose Seasonal PF 06/13/2016, 07/03/2017, 06/16/2018   Influenza,inj,Quad PF,6+ Mos 05/20/2013, 05/31/2014, 07/18/2015   Influenza-Unspecified 08/20/2011   Moderna SARS-COVID-2 Vaccination 09/23/2019, 10/22/2019   Pneumococcal Conjugate-13 05/31/2014   Pneumococcal Polysaccharide-23 07/22/2012   Tdap 02/02/2015    Diabetes She presents for her follow-up diabetic visit. She  has type 2 diabetes mellitus. Onset time: She was diagnosed at approximate age of 50 years. Her disease course has been stable. There are no hypoglycemic associated symptoms. Pertinent negatives for hypoglycemia include no confusion, headaches, pallor or seizures. Pertinent negatives for diabetes include no chest pain, no fatigue, no foot paresthesias, no polydipsia, no polyphagia and no polyuria. There are no hypoglycemic complications. Symptoms are stable. Diabetic complications include peripheral neuropathy. Risk factors for coronary artery disease include diabetes mellitus, dyslipidemia, hypertension, sedentary lifestyle and post-menopausal. Current diabetic treatment includes oral agent (dual therapy). She is compliant with treatment most of the time. Her weight is stable. She is following a generally unhealthy diet. When asked about meal planning, she reported none. She has not had a previous visit with a dietitian. She rarely participates in exercise. Her home blood glucose trend is fluctuating minimally. (She presents for her virtual visit today, assisted by her daughter, without meter or logs to review.  She seldomly checks her glucose at home.  Her last visit A1C was 7.0%, not checked prior to todays appointment.  She denies any s/s of hypoglycemia.) An ACE inhibitor/angiotensin II receptor blocker is being taken. She sees a podiatrist.Eye exam is current.  Hyperlipidemia This is a chronic problem. The current episode started more than 1 year ago. The problem is controlled. Recent lipid tests were reviewed and are normal. Exacerbating diseases include diabetes. Factors aggravating her hyperlipidemia include beta blockers. Pertinent negatives include no chest pain, myalgias or shortness of breath. Current antihyperlipidemic treatment includes statins. The current treatment provides moderate improvement of lipids. There are no compliance problems.  Risk factors for coronary artery disease include  dyslipidemia, diabetes mellitus, hypertension, a sedentary lifestyle and post-menopausal.  Hypertension This is a chronic problem. The current episode started more than 1 year ago. The problem has been waxing and waning since onset. The problem is controlled. Pertinent negatives include no chest pain, headaches, palpitations or shortness of breath. There are no associated agents to hypertension. Risk factors for coronary artery disease include diabetes mellitus, dyslipidemia, sedentary lifestyle, family history and post-menopausal state. Past treatments include ACE inhibitors and beta blockers. The current treatment provides mild improvement. Compliance problems include psychosocial issues.    Review of systems  Constitutional: + Minimally fluctuating body  weight,  current There is no height or weight on file to calculate BMI. , no fatigue, no subjective hyperthermia, no subjective hypothermia Eyes: no blurry vision, no xerophthalmia ENT: no sore throat, no nodules palpated in throat, no dysphagia/odynophagia, no hoarseness Cardiovascular: no chest pain, no shortness of breath, no palpitations, no leg swelling Respiratory: no cough, no shortness of breath Gastrointestinal: no nausea/vomiting/diarrhea Musculoskeletal: no muscle/joint aches Skin: no rashes, no hyperemia Neurological: no tremors, no numbness, no tingling, no dizziness Psychiatric: no depression, no anxiety  Objective:    There were no vitals taken for this visit.  Wt Readings from Last 3 Encounters:  03/23/20 164 lb 9.6 oz (74.7 kg)  01/24/20 164 lb 3.2 oz (74.5 kg)  12/27/19 162 lb 6.4 oz (73.7 kg)    BP Readings from Last 3 Encounters:  03/23/20 (!) 140/58  01/24/20 112/60  12/27/19 (!) 149/55    Physical Exam- Telehealth- significantly limited due to nature of visit  Constitutional: There is no height or weight on file to calculate BMI. , not in acute distress, normal state of mind Respiratory: Adequate breathing  efforts  CMP     Component Value Date/Time   NA 139 05/02/2020 0810   K 4.5 05/02/2020 0810   CL 100 05/02/2020 0810   CO2 22 05/02/2020 0810   GLUCOSE 160 (H) 05/02/2020 0810   GLUCOSE 184 (H) 08/07/2019 0310   BUN 14 05/02/2020 0810   CREATININE 0.61 05/02/2020 0810   CREATININE 0.54 03/02/2013 0931   CALCIUM 9.3 05/02/2020 0810   PROT 6.6 05/02/2020 0810   ALBUMIN 4.3 05/02/2020 0810   AST 20 05/02/2020 0810   ALT 20 05/02/2020 0810   ALKPHOS 54 05/02/2020 0810   BILITOT 0.3 05/02/2020 0810   GFRNONAA 87 05/02/2020 0810   GFRNONAA >89 03/02/2013 0931   GFRAA 100 05/02/2020 0810   GFRAA >89 03/02/2013 0931     Diabetic Labs (most recent): Lab Results  Component Value Date   HGBA1C 7.0 (A) 12/27/2019   HGBA1C 7.0 (H) 06/21/2019   HGBA1C 7.1 (H) 10/29/2018     Lipid Panel ( most recent) Lipid Panel     Component Value Date/Time   CHOL 115 05/02/2020 0810   CHOL 177 02/23/2013 0911   TRIG 174 (H) 05/02/2020 0810   TRIG 203 (H) 01/12/2015 0915   TRIG 293 (H) 02/23/2013 0911   HDL 40 05/02/2020 0810   HDL 40 01/12/2015 0915   HDL 48 02/23/2013 0911   CHOLHDL 2.9 05/02/2020 0810   LDLCALC 46 05/02/2020 0810   LDLCALC 119 (H) 10/06/2013 1012   LDLCALC 70 02/23/2013 0911      Assessment & Plan:   1. Uncontrolled type 2 diabetes mellitus with diabetic neuropathy, without long-term current use of insulin ([place])  - Patient has currently uncontrolled symptomatic type 2 DM since  79 years of age.  She presents for her virtual visit today, assisted by her daughter due to aphasia and hearing impairment, without meter or logs to review.  She seldomly checks her glucose at home.  Her last visit A1C was 7.0%, not checked prior to todays appointment.  She denies any s/s of hypoglycemia.   Recent labs reviewed, showing normal renal function.   Her diabetes is complicated by referral neuropathy and patient remains at a high risk for more acute and chronic complications of  diabetes which include CAD, CVA, CKD, retinopathy, and neuropathy. These are all discussed in detail with the patient.  - I have counseled the  patient on diet management and weight loss, by adopting a carbohydrate restricted/protein rich diet.  - The patient admits there is a room for improvement in their diet and drink choices. -  Suggestion is made for the patient to avoid simple carbohydrates from their diet including Cakes, Sweet Desserts / Pastries, Ice Cream, Soda (diet and regular), Sweet Tea, Candies, Chips, Cookies, Sweet Pastries,  Store Bought Juices, Alcohol in Excess of  1-2 drinks a day, Artificial Sweeteners, Coffee Creamer, and "Sugar-free" Products. This will help patient to have stable blood glucose profile and potentially avoid unintended weight gain.   - I encouraged the patient to switch to  unprocessed or minimally processed complex starch and increased protein intake (animal or plant source), fruits, and vegetables.   - Patient is advised to stick to a routine mealtimes to eat 3 meals  a day and avoid unnecessary snacks ( to snack only to correct hypoglycemia).  - I have approached patient with the following individualized plan to manage diabetes and patient agrees:   -She has maintained reasonable control of diabetes.  -Given her cognitive limitations and motor aphasia, she will benefit from simplified treatment regimen. She is advised to continue Metformin 1000 mg po twice daily with meals and Januvia 50 mg po daily at breakfast.  -She is encouraged to monitor blood glucose at least once daily, before breakfast and notify the clinic if readings are less than 70 or greater than 200 for 3 tests in a row.  - Patient specific target  A1c;  LDL, HDL, Triglycerides, and  Waist Circumference were discussed in detail.  2) BP/HTN:  Her blood pressure from previous visits fluctuates.  She does not have a way of measuring BP at home.  She is advised to continue Metoprolol 100 mg po  daily, Lisinopril 40 mg po daily, and amlodipine 5 mg po daily.  3) Lipids/HPL:  Her lipid panel from 05/02/20 shows controlled LDL of 46 and elevated triglycerides of 174.  She is advised to continue Crestor 20 mg po daily, and Omega 3 Fatty Acids 1200 mg po daily.  Side effects and precautions discussed with her.  4)  Weight/Diet:  Her There is no height or weight on file to calculate BMI.  She is not a candidate for major weight loss.  CDE Consult has been initiated , exercise, and detailed carbohydrates information provided.  5) Hypothyroidism: Her previsit TFTs are consistent with appropriate hormone replacement.  She is advised to continue Levothyroxine 75 mcg po daily before breakfast.    - We discussed about the correct intake of her thyroid hormone, on empty stomach at fasting, with water, separated by at least 30 minutes from breakfast and other medications,  and separated by more than 4 hours from calcium, iron, multivitamins, acid reflux medications (PPIs). -Patient is made aware of the fact that thyroid hormone replacement is needed for life, dose to be adjusted by periodic monitoring of thyroid function tests.  6) Chronic Care/Health Maintenance: -Patient is on ACEI/ARB and Statin medications and encouraged to continue to follow up with Ophthalmology, Podiatrist at least yearly or according to recommendations, and advised to  stay away from smoking. I have recommended yearly flu vaccine and pneumonia vaccination at least every 5 years; moderate intensity exercise for up to 150 minutes weekly; and  sleep for at least 7 hours a day.  - I advised patient to maintain close follow up with Sharion Balloon, FNP for primary care needs.   I spent 30  minutes dedicated to the care of this patient on the date of this encounter to include pre-visit review of records, face-to-face time with the patient, and post visit ordering of  Testing.   Please refer to Patient Instructions for Blood  Glucose Monitoring and Insulin/Medications Dosing Guide"  in media tab for additional information. Please  also refer to " Patient Self Inventory" in the Media  tab for reviewed elements of pertinent patient history.  Kristi Willis participated in the discussions, expressed understanding, and voiced agreement with the above plans.  All questions were answered to her satisfaction. she is encouraged to contact clinic should she have any questions or concerns prior to her return visit.   Follow up plan: - Return in about 4 months (around 09/08/2020) for Diabetes follow up, Thyroid follow up, Previsit labs, Virtual visit ok.  Rayetta Pigg, [facility] [address] [place], [place] [address] Phone: [phone] Fax: [phone]  05/09/2020, 1:18 PM

## 2020-05-10 DIAGNOSIS — J3089 Other allergic rhinitis: Secondary | ICD-10-CM | POA: Diagnosis not present

## 2020-05-10 DIAGNOSIS — J301 Allergic rhinitis due to pollen: Secondary | ICD-10-CM | POA: Diagnosis not present

## 2020-05-10 DIAGNOSIS — J3081 Allergic rhinitis due to animal (cat) (dog) hair and dander: Secondary | ICD-10-CM | POA: Diagnosis not present

## 2020-05-11 DIAGNOSIS — R4701 Aphasia: Secondary | ICD-10-CM | POA: Diagnosis not present

## 2020-05-11 DIAGNOSIS — Z7984 Long term (current) use of oral hypoglycemic drugs: Secondary | ICD-10-CM | POA: Diagnosis not present

## 2020-05-11 DIAGNOSIS — F0280 Dementia in other diseases classified elsewhere without behavioral disturbance: Secondary | ICD-10-CM | POA: Diagnosis not present

## 2020-05-11 DIAGNOSIS — F800 Phonological disorder: Secondary | ICD-10-CM | POA: Diagnosis not present

## 2020-05-11 DIAGNOSIS — E1140 Type 2 diabetes mellitus with diabetic neuropathy, unspecified: Secondary | ICD-10-CM | POA: Diagnosis not present

## 2020-05-11 DIAGNOSIS — J449 Chronic obstructive pulmonary disease, unspecified: Secondary | ICD-10-CM | POA: Diagnosis not present

## 2020-05-16 DIAGNOSIS — R4701 Aphasia: Secondary | ICD-10-CM | POA: Diagnosis not present

## 2020-05-16 DIAGNOSIS — F0280 Dementia in other diseases classified elsewhere without behavioral disturbance: Secondary | ICD-10-CM | POA: Diagnosis not present

## 2020-05-16 DIAGNOSIS — Z7984 Long term (current) use of oral hypoglycemic drugs: Secondary | ICD-10-CM | POA: Diagnosis not present

## 2020-05-16 DIAGNOSIS — F800 Phonological disorder: Secondary | ICD-10-CM | POA: Diagnosis not present

## 2020-05-16 DIAGNOSIS — J449 Chronic obstructive pulmonary disease, unspecified: Secondary | ICD-10-CM | POA: Diagnosis not present

## 2020-05-16 DIAGNOSIS — E1140 Type 2 diabetes mellitus with diabetic neuropathy, unspecified: Secondary | ICD-10-CM | POA: Diagnosis not present

## 2020-05-18 DIAGNOSIS — J301 Allergic rhinitis due to pollen: Secondary | ICD-10-CM | POA: Diagnosis not present

## 2020-05-18 DIAGNOSIS — J3081 Allergic rhinitis due to animal (cat) (dog) hair and dander: Secondary | ICD-10-CM | POA: Diagnosis not present

## 2020-05-18 DIAGNOSIS — J3089 Other allergic rhinitis: Secondary | ICD-10-CM | POA: Diagnosis not present

## 2020-05-19 DIAGNOSIS — F0280 Dementia in other diseases classified elsewhere without behavioral disturbance: Secondary | ICD-10-CM | POA: Diagnosis not present

## 2020-05-19 DIAGNOSIS — R4701 Aphasia: Secondary | ICD-10-CM | POA: Diagnosis not present

## 2020-05-19 DIAGNOSIS — E1140 Type 2 diabetes mellitus with diabetic neuropathy, unspecified: Secondary | ICD-10-CM | POA: Diagnosis not present

## 2020-05-19 DIAGNOSIS — F800 Phonological disorder: Secondary | ICD-10-CM | POA: Diagnosis not present

## 2020-05-19 DIAGNOSIS — J449 Chronic obstructive pulmonary disease, unspecified: Secondary | ICD-10-CM | POA: Diagnosis not present

## 2020-05-19 DIAGNOSIS — Z7984 Long term (current) use of oral hypoglycemic drugs: Secondary | ICD-10-CM | POA: Diagnosis not present

## 2020-05-23 NOTE — Telephone Encounter (Signed)
Form on your desk to sign if you agree.

## 2020-05-24 DIAGNOSIS — J3089 Other allergic rhinitis: Secondary | ICD-10-CM | POA: Diagnosis not present

## 2020-05-24 DIAGNOSIS — J3081 Allergic rhinitis due to animal (cat) (dog) hair and dander: Secondary | ICD-10-CM | POA: Diagnosis not present

## 2020-05-24 DIAGNOSIS — J301 Allergic rhinitis due to pollen: Secondary | ICD-10-CM | POA: Diagnosis not present

## 2020-05-26 DIAGNOSIS — Z7984 Long term (current) use of oral hypoglycemic drugs: Secondary | ICD-10-CM | POA: Diagnosis not present

## 2020-05-26 DIAGNOSIS — F800 Phonological disorder: Secondary | ICD-10-CM | POA: Diagnosis not present

## 2020-05-26 DIAGNOSIS — E1140 Type 2 diabetes mellitus with diabetic neuropathy, unspecified: Secondary | ICD-10-CM | POA: Diagnosis not present

## 2020-05-26 DIAGNOSIS — R4701 Aphasia: Secondary | ICD-10-CM | POA: Diagnosis not present

## 2020-05-26 DIAGNOSIS — F0280 Dementia in other diseases classified elsewhere without behavioral disturbance: Secondary | ICD-10-CM | POA: Diagnosis not present

## 2020-05-26 DIAGNOSIS — J449 Chronic obstructive pulmonary disease, unspecified: Secondary | ICD-10-CM | POA: Diagnosis not present

## 2020-05-31 DIAGNOSIS — J3089 Other allergic rhinitis: Secondary | ICD-10-CM | POA: Diagnosis not present

## 2020-05-31 DIAGNOSIS — F800 Phonological disorder: Secondary | ICD-10-CM | POA: Diagnosis not present

## 2020-05-31 DIAGNOSIS — J301 Allergic rhinitis due to pollen: Secondary | ICD-10-CM | POA: Diagnosis not present

## 2020-05-31 DIAGNOSIS — F0280 Dementia in other diseases classified elsewhere without behavioral disturbance: Secondary | ICD-10-CM | POA: Diagnosis not present

## 2020-05-31 DIAGNOSIS — J449 Chronic obstructive pulmonary disease, unspecified: Secondary | ICD-10-CM | POA: Diagnosis not present

## 2020-05-31 DIAGNOSIS — R4701 Aphasia: Secondary | ICD-10-CM | POA: Diagnosis not present

## 2020-05-31 DIAGNOSIS — E1140 Type 2 diabetes mellitus with diabetic neuropathy, unspecified: Secondary | ICD-10-CM | POA: Diagnosis not present

## 2020-05-31 DIAGNOSIS — J3081 Allergic rhinitis due to animal (cat) (dog) hair and dander: Secondary | ICD-10-CM | POA: Diagnosis not present

## 2020-05-31 DIAGNOSIS — Z7984 Long term (current) use of oral hypoglycemic drugs: Secondary | ICD-10-CM | POA: Diagnosis not present

## 2020-06-01 ENCOUNTER — Other Ambulatory Visit: Payer: Self-pay | Admitting: Family

## 2020-06-02 ENCOUNTER — Other Ambulatory Visit: Payer: Self-pay | Admitting: Family

## 2020-06-05 MED ORDER — ESCITALOPRAM OXALATE 20 MG PO TABS
20.0000 mg | ORAL_TABLET | Freq: Every day | ORAL | 0 refills | Status: DC
Start: 2020-06-05 — End: 2020-07-07

## 2020-06-05 NOTE — Addendum Note (Signed)
Addended by: Antonietta Barcelona D on: 06/05/2020 10:23 AM   Modules accepted: Orders

## 2020-06-05 NOTE — Telephone Encounter (Signed)
E-prescribe down. resent 

## 2020-06-12 ENCOUNTER — Other Ambulatory Visit: Payer: Self-pay | Admitting: Family

## 2020-06-14 DIAGNOSIS — J449 Chronic obstructive pulmonary disease, unspecified: Secondary | ICD-10-CM | POA: Diagnosis not present

## 2020-06-14 DIAGNOSIS — J3081 Allergic rhinitis due to animal (cat) (dog) hair and dander: Secondary | ICD-10-CM | POA: Diagnosis not present

## 2020-06-14 DIAGNOSIS — Z7984 Long term (current) use of oral hypoglycemic drugs: Secondary | ICD-10-CM | POA: Diagnosis not present

## 2020-06-14 DIAGNOSIS — F0280 Dementia in other diseases classified elsewhere without behavioral disturbance: Secondary | ICD-10-CM | POA: Diagnosis not present

## 2020-06-14 DIAGNOSIS — E1140 Type 2 diabetes mellitus with diabetic neuropathy, unspecified: Secondary | ICD-10-CM | POA: Diagnosis not present

## 2020-06-14 DIAGNOSIS — F800 Phonological disorder: Secondary | ICD-10-CM | POA: Diagnosis not present

## 2020-06-14 DIAGNOSIS — J3089 Other allergic rhinitis: Secondary | ICD-10-CM | POA: Diagnosis not present

## 2020-06-14 DIAGNOSIS — R4701 Aphasia: Secondary | ICD-10-CM | POA: Diagnosis not present

## 2020-06-14 DIAGNOSIS — J301 Allergic rhinitis due to pollen: Secondary | ICD-10-CM | POA: Diagnosis not present

## 2020-06-15 DIAGNOSIS — R4701 Aphasia: Secondary | ICD-10-CM | POA: Diagnosis not present

## 2020-06-15 DIAGNOSIS — E1140 Type 2 diabetes mellitus with diabetic neuropathy, unspecified: Secondary | ICD-10-CM | POA: Diagnosis not present

## 2020-06-15 DIAGNOSIS — F800 Phonological disorder: Secondary | ICD-10-CM | POA: Diagnosis not present

## 2020-06-15 DIAGNOSIS — F0280 Dementia in other diseases classified elsewhere without behavioral disturbance: Secondary | ICD-10-CM | POA: Diagnosis not present

## 2020-06-15 DIAGNOSIS — J449 Chronic obstructive pulmonary disease, unspecified: Secondary | ICD-10-CM | POA: Diagnosis not present

## 2020-06-15 DIAGNOSIS — Z7984 Long term (current) use of oral hypoglycemic drugs: Secondary | ICD-10-CM | POA: Diagnosis not present

## 2020-06-19 DIAGNOSIS — F800 Phonological disorder: Secondary | ICD-10-CM | POA: Diagnosis not present

## 2020-06-19 DIAGNOSIS — R4701 Aphasia: Secondary | ICD-10-CM | POA: Diagnosis not present

## 2020-06-19 DIAGNOSIS — E1140 Type 2 diabetes mellitus with diabetic neuropathy, unspecified: Secondary | ICD-10-CM | POA: Diagnosis not present

## 2020-06-19 DIAGNOSIS — Z7984 Long term (current) use of oral hypoglycemic drugs: Secondary | ICD-10-CM | POA: Diagnosis not present

## 2020-06-19 DIAGNOSIS — J449 Chronic obstructive pulmonary disease, unspecified: Secondary | ICD-10-CM | POA: Diagnosis not present

## 2020-06-19 DIAGNOSIS — F0280 Dementia in other diseases classified elsewhere without behavioral disturbance: Secondary | ICD-10-CM | POA: Diagnosis not present

## 2020-06-20 DIAGNOSIS — J301 Allergic rhinitis due to pollen: Secondary | ICD-10-CM | POA: Diagnosis not present

## 2020-06-20 DIAGNOSIS — J3081 Allergic rhinitis due to animal (cat) (dog) hair and dander: Secondary | ICD-10-CM | POA: Diagnosis not present

## 2020-06-20 DIAGNOSIS — J3089 Other allergic rhinitis: Secondary | ICD-10-CM | POA: Diagnosis not present

## 2020-06-21 DIAGNOSIS — F800 Phonological disorder: Secondary | ICD-10-CM | POA: Diagnosis not present

## 2020-06-21 DIAGNOSIS — E1140 Type 2 diabetes mellitus with diabetic neuropathy, unspecified: Secondary | ICD-10-CM | POA: Diagnosis not present

## 2020-06-21 DIAGNOSIS — Z7984 Long term (current) use of oral hypoglycemic drugs: Secondary | ICD-10-CM | POA: Diagnosis not present

## 2020-06-21 DIAGNOSIS — J449 Chronic obstructive pulmonary disease, unspecified: Secondary | ICD-10-CM | POA: Diagnosis not present

## 2020-06-21 DIAGNOSIS — F0280 Dementia in other diseases classified elsewhere without behavioral disturbance: Secondary | ICD-10-CM | POA: Diagnosis not present

## 2020-06-21 DIAGNOSIS — R4701 Aphasia: Secondary | ICD-10-CM | POA: Diagnosis not present

## 2020-06-26 DIAGNOSIS — J3089 Other allergic rhinitis: Secondary | ICD-10-CM | POA: Diagnosis not present

## 2020-06-26 DIAGNOSIS — H04123 Dry eye syndrome of bilateral lacrimal glands: Secondary | ICD-10-CM | POA: Diagnosis not present

## 2020-06-26 DIAGNOSIS — H35363 Drusen (degenerative) of macula, bilateral: Secondary | ICD-10-CM | POA: Diagnosis not present

## 2020-06-26 DIAGNOSIS — Z961 Presence of intraocular lens: Secondary | ICD-10-CM | POA: Diagnosis not present

## 2020-06-26 DIAGNOSIS — F0280 Dementia in other diseases classified elsewhere without behavioral disturbance: Secondary | ICD-10-CM | POA: Diagnosis not present

## 2020-06-26 DIAGNOSIS — J301 Allergic rhinitis due to pollen: Secondary | ICD-10-CM | POA: Diagnosis not present

## 2020-06-26 DIAGNOSIS — Z7984 Long term (current) use of oral hypoglycemic drugs: Secondary | ICD-10-CM | POA: Diagnosis not present

## 2020-06-26 DIAGNOSIS — E119 Type 2 diabetes mellitus without complications: Secondary | ICD-10-CM | POA: Diagnosis not present

## 2020-06-26 DIAGNOSIS — E1140 Type 2 diabetes mellitus with diabetic neuropathy, unspecified: Secondary | ICD-10-CM | POA: Diagnosis not present

## 2020-06-26 DIAGNOSIS — R4701 Aphasia: Secondary | ICD-10-CM | POA: Diagnosis not present

## 2020-06-26 DIAGNOSIS — F800 Phonological disorder: Secondary | ICD-10-CM | POA: Diagnosis not present

## 2020-06-26 DIAGNOSIS — J449 Chronic obstructive pulmonary disease, unspecified: Secondary | ICD-10-CM | POA: Diagnosis not present

## 2020-06-27 ENCOUNTER — Other Ambulatory Visit: Payer: Self-pay

## 2020-06-27 ENCOUNTER — Ambulatory Visit: Payer: Medicare PPO | Admitting: *Deleted

## 2020-06-27 ENCOUNTER — Ambulatory Visit: Payer: Medicare PPO

## 2020-06-29 DIAGNOSIS — Z7984 Long term (current) use of oral hypoglycemic drugs: Secondary | ICD-10-CM | POA: Diagnosis not present

## 2020-06-29 DIAGNOSIS — F800 Phonological disorder: Secondary | ICD-10-CM | POA: Diagnosis not present

## 2020-06-29 DIAGNOSIS — J449 Chronic obstructive pulmonary disease, unspecified: Secondary | ICD-10-CM | POA: Diagnosis not present

## 2020-06-29 DIAGNOSIS — E1140 Type 2 diabetes mellitus with diabetic neuropathy, unspecified: Secondary | ICD-10-CM | POA: Diagnosis not present

## 2020-06-29 DIAGNOSIS — R4701 Aphasia: Secondary | ICD-10-CM | POA: Diagnosis not present

## 2020-06-29 DIAGNOSIS — F0280 Dementia in other diseases classified elsewhere without behavioral disturbance: Secondary | ICD-10-CM | POA: Diagnosis not present

## 2020-07-03 DIAGNOSIS — F800 Phonological disorder: Secondary | ICD-10-CM | POA: Diagnosis not present

## 2020-07-03 DIAGNOSIS — J449 Chronic obstructive pulmonary disease, unspecified: Secondary | ICD-10-CM | POA: Diagnosis not present

## 2020-07-03 DIAGNOSIS — Z7984 Long term (current) use of oral hypoglycemic drugs: Secondary | ICD-10-CM | POA: Diagnosis not present

## 2020-07-03 DIAGNOSIS — F0280 Dementia in other diseases classified elsewhere without behavioral disturbance: Secondary | ICD-10-CM | POA: Diagnosis not present

## 2020-07-03 DIAGNOSIS — E1140 Type 2 diabetes mellitus with diabetic neuropathy, unspecified: Secondary | ICD-10-CM | POA: Diagnosis not present

## 2020-07-03 DIAGNOSIS — R4701 Aphasia: Secondary | ICD-10-CM | POA: Diagnosis not present

## 2020-07-06 ENCOUNTER — Other Ambulatory Visit: Payer: Self-pay | Admitting: Family

## 2020-07-06 DIAGNOSIS — B351 Tinea unguium: Secondary | ICD-10-CM | POA: Diagnosis not present

## 2020-07-06 DIAGNOSIS — M79676 Pain in unspecified toe(s): Secondary | ICD-10-CM | POA: Diagnosis not present

## 2020-07-06 DIAGNOSIS — L84 Corns and callosities: Secondary | ICD-10-CM | POA: Diagnosis not present

## 2020-07-06 DIAGNOSIS — J3081 Allergic rhinitis due to animal (cat) (dog) hair and dander: Secondary | ICD-10-CM | POA: Diagnosis not present

## 2020-07-06 DIAGNOSIS — J301 Allergic rhinitis due to pollen: Secondary | ICD-10-CM | POA: Diagnosis not present

## 2020-07-06 DIAGNOSIS — J3089 Other allergic rhinitis: Secondary | ICD-10-CM | POA: Diagnosis not present

## 2020-07-06 DIAGNOSIS — E1142 Type 2 diabetes mellitus with diabetic polyneuropathy: Secondary | ICD-10-CM | POA: Diagnosis not present

## 2020-07-10 DIAGNOSIS — F0280 Dementia in other diseases classified elsewhere without behavioral disturbance: Secondary | ICD-10-CM | POA: Diagnosis not present

## 2020-07-10 DIAGNOSIS — Z7984 Long term (current) use of oral hypoglycemic drugs: Secondary | ICD-10-CM | POA: Diagnosis not present

## 2020-07-10 DIAGNOSIS — R4701 Aphasia: Secondary | ICD-10-CM | POA: Diagnosis not present

## 2020-07-10 DIAGNOSIS — F800 Phonological disorder: Secondary | ICD-10-CM | POA: Diagnosis not present

## 2020-07-10 DIAGNOSIS — E1140 Type 2 diabetes mellitus with diabetic neuropathy, unspecified: Secondary | ICD-10-CM | POA: Diagnosis not present

## 2020-07-10 DIAGNOSIS — J449 Chronic obstructive pulmonary disease, unspecified: Secondary | ICD-10-CM | POA: Diagnosis not present

## 2020-07-17 DIAGNOSIS — J449 Chronic obstructive pulmonary disease, unspecified: Secondary | ICD-10-CM | POA: Diagnosis not present

## 2020-07-17 DIAGNOSIS — F800 Phonological disorder: Secondary | ICD-10-CM | POA: Diagnosis not present

## 2020-07-17 DIAGNOSIS — F0280 Dementia in other diseases classified elsewhere without behavioral disturbance: Secondary | ICD-10-CM | POA: Diagnosis not present

## 2020-07-17 DIAGNOSIS — E1140 Type 2 diabetes mellitus with diabetic neuropathy, unspecified: Secondary | ICD-10-CM | POA: Diagnosis not present

## 2020-07-17 DIAGNOSIS — Z7984 Long term (current) use of oral hypoglycemic drugs: Secondary | ICD-10-CM | POA: Diagnosis not present

## 2020-07-17 DIAGNOSIS — R4701 Aphasia: Secondary | ICD-10-CM | POA: Diagnosis not present

## 2020-07-18 ENCOUNTER — Other Ambulatory Visit: Payer: Self-pay | Admitting: Family

## 2020-07-19 DIAGNOSIS — J301 Allergic rhinitis due to pollen: Secondary | ICD-10-CM | POA: Diagnosis not present

## 2020-07-19 DIAGNOSIS — J3089 Other allergic rhinitis: Secondary | ICD-10-CM | POA: Diagnosis not present

## 2020-07-19 DIAGNOSIS — R059 Cough, unspecified: Secondary | ICD-10-CM | POA: Diagnosis not present

## 2020-07-24 DIAGNOSIS — F800 Phonological disorder: Secondary | ICD-10-CM | POA: Diagnosis not present

## 2020-07-24 DIAGNOSIS — Z7984 Long term (current) use of oral hypoglycemic drugs: Secondary | ICD-10-CM | POA: Diagnosis not present

## 2020-07-24 DIAGNOSIS — E1140 Type 2 diabetes mellitus with diabetic neuropathy, unspecified: Secondary | ICD-10-CM | POA: Diagnosis not present

## 2020-07-24 DIAGNOSIS — F0280 Dementia in other diseases classified elsewhere without behavioral disturbance: Secondary | ICD-10-CM | POA: Diagnosis not present

## 2020-07-24 DIAGNOSIS — R4701 Aphasia: Secondary | ICD-10-CM | POA: Diagnosis not present

## 2020-07-24 DIAGNOSIS — J449 Chronic obstructive pulmonary disease, unspecified: Secondary | ICD-10-CM | POA: Diagnosis not present

## 2020-07-25 DIAGNOSIS — J3089 Other allergic rhinitis: Secondary | ICD-10-CM | POA: Diagnosis not present

## 2020-07-25 DIAGNOSIS — J3081 Allergic rhinitis due to animal (cat) (dog) hair and dander: Secondary | ICD-10-CM | POA: Diagnosis not present

## 2020-07-25 DIAGNOSIS — J301 Allergic rhinitis due to pollen: Secondary | ICD-10-CM | POA: Diagnosis not present

## 2020-07-31 DIAGNOSIS — J3081 Allergic rhinitis due to animal (cat) (dog) hair and dander: Secondary | ICD-10-CM | POA: Diagnosis not present

## 2020-07-31 DIAGNOSIS — J3089 Other allergic rhinitis: Secondary | ICD-10-CM | POA: Diagnosis not present

## 2020-07-31 DIAGNOSIS — J301 Allergic rhinitis due to pollen: Secondary | ICD-10-CM | POA: Diagnosis not present

## 2020-08-01 ENCOUNTER — Other Ambulatory Visit: Payer: Self-pay

## 2020-08-01 ENCOUNTER — Ambulatory Visit (INDEPENDENT_AMBULATORY_CARE_PROVIDER_SITE_OTHER): Payer: Medicare PPO

## 2020-08-01 DIAGNOSIS — F800 Phonological disorder: Secondary | ICD-10-CM | POA: Diagnosis not present

## 2020-08-01 DIAGNOSIS — Z7984 Long term (current) use of oral hypoglycemic drugs: Secondary | ICD-10-CM

## 2020-08-01 DIAGNOSIS — J449 Chronic obstructive pulmonary disease, unspecified: Secondary | ICD-10-CM | POA: Diagnosis not present

## 2020-08-01 DIAGNOSIS — F0280 Dementia in other diseases classified elsewhere without behavioral disturbance: Secondary | ICD-10-CM

## 2020-08-01 DIAGNOSIS — E1140 Type 2 diabetes mellitus with diabetic neuropathy, unspecified: Secondary | ICD-10-CM

## 2020-08-01 DIAGNOSIS — R4701 Aphasia: Secondary | ICD-10-CM | POA: Diagnosis not present

## 2020-08-02 ENCOUNTER — Other Ambulatory Visit: Payer: Self-pay | Admitting: Family

## 2020-08-03 DIAGNOSIS — F800 Phonological disorder: Secondary | ICD-10-CM | POA: Diagnosis not present

## 2020-08-03 DIAGNOSIS — Z7984 Long term (current) use of oral hypoglycemic drugs: Secondary | ICD-10-CM | POA: Diagnosis not present

## 2020-08-03 DIAGNOSIS — R4701 Aphasia: Secondary | ICD-10-CM | POA: Diagnosis not present

## 2020-08-03 DIAGNOSIS — J449 Chronic obstructive pulmonary disease, unspecified: Secondary | ICD-10-CM | POA: Diagnosis not present

## 2020-08-03 DIAGNOSIS — F0280 Dementia in other diseases classified elsewhere without behavioral disturbance: Secondary | ICD-10-CM | POA: Diagnosis not present

## 2020-08-03 DIAGNOSIS — E1140 Type 2 diabetes mellitus with diabetic neuropathy, unspecified: Secondary | ICD-10-CM | POA: Diagnosis not present

## 2020-08-07 DIAGNOSIS — E1140 Type 2 diabetes mellitus with diabetic neuropathy, unspecified: Secondary | ICD-10-CM | POA: Diagnosis not present

## 2020-08-07 DIAGNOSIS — F800 Phonological disorder: Secondary | ICD-10-CM | POA: Diagnosis not present

## 2020-08-07 DIAGNOSIS — F0280 Dementia in other diseases classified elsewhere without behavioral disturbance: Secondary | ICD-10-CM | POA: Diagnosis not present

## 2020-08-07 DIAGNOSIS — Z7984 Long term (current) use of oral hypoglycemic drugs: Secondary | ICD-10-CM | POA: Diagnosis not present

## 2020-08-07 DIAGNOSIS — J449 Chronic obstructive pulmonary disease, unspecified: Secondary | ICD-10-CM | POA: Diagnosis not present

## 2020-08-07 DIAGNOSIS — R4701 Aphasia: Secondary | ICD-10-CM | POA: Diagnosis not present

## 2020-08-08 ENCOUNTER — Other Ambulatory Visit: Payer: Self-pay | Admitting: Family

## 2020-08-08 DIAGNOSIS — J301 Allergic rhinitis due to pollen: Secondary | ICD-10-CM | POA: Diagnosis not present

## 2020-08-08 DIAGNOSIS — J3081 Allergic rhinitis due to animal (cat) (dog) hair and dander: Secondary | ICD-10-CM | POA: Diagnosis not present

## 2020-08-08 DIAGNOSIS — J3089 Other allergic rhinitis: Secondary | ICD-10-CM | POA: Diagnosis not present

## 2020-08-14 ENCOUNTER — Other Ambulatory Visit: Payer: Self-pay | Admitting: Family

## 2020-08-14 DIAGNOSIS — F0280 Dementia in other diseases classified elsewhere without behavioral disturbance: Secondary | ICD-10-CM | POA: Diagnosis not present

## 2020-08-14 DIAGNOSIS — F800 Phonological disorder: Secondary | ICD-10-CM | POA: Diagnosis not present

## 2020-08-14 DIAGNOSIS — Z7984 Long term (current) use of oral hypoglycemic drugs: Secondary | ICD-10-CM | POA: Diagnosis not present

## 2020-08-14 DIAGNOSIS — E1140 Type 2 diabetes mellitus with diabetic neuropathy, unspecified: Secondary | ICD-10-CM | POA: Diagnosis not present

## 2020-08-14 DIAGNOSIS — R4701 Aphasia: Secondary | ICD-10-CM | POA: Diagnosis not present

## 2020-08-14 DIAGNOSIS — J449 Chronic obstructive pulmonary disease, unspecified: Secondary | ICD-10-CM | POA: Diagnosis not present

## 2020-08-15 DIAGNOSIS — J3081 Allergic rhinitis due to animal (cat) (dog) hair and dander: Secondary | ICD-10-CM | POA: Diagnosis not present

## 2020-08-15 DIAGNOSIS — J301 Allergic rhinitis due to pollen: Secondary | ICD-10-CM | POA: Diagnosis not present

## 2020-08-15 DIAGNOSIS — J3089 Other allergic rhinitis: Secondary | ICD-10-CM | POA: Diagnosis not present

## 2020-08-22 DIAGNOSIS — F800 Phonological disorder: Secondary | ICD-10-CM | POA: Diagnosis not present

## 2020-08-22 DIAGNOSIS — J449 Chronic obstructive pulmonary disease, unspecified: Secondary | ICD-10-CM | POA: Diagnosis not present

## 2020-08-22 DIAGNOSIS — F0280 Dementia in other diseases classified elsewhere without behavioral disturbance: Secondary | ICD-10-CM | POA: Diagnosis not present

## 2020-08-22 DIAGNOSIS — Z7984 Long term (current) use of oral hypoglycemic drugs: Secondary | ICD-10-CM | POA: Diagnosis not present

## 2020-08-22 DIAGNOSIS — R4701 Aphasia: Secondary | ICD-10-CM | POA: Diagnosis not present

## 2020-08-22 DIAGNOSIS — E1140 Type 2 diabetes mellitus with diabetic neuropathy, unspecified: Secondary | ICD-10-CM | POA: Diagnosis not present

## 2020-08-23 DIAGNOSIS — J3089 Other allergic rhinitis: Secondary | ICD-10-CM | POA: Diagnosis not present

## 2020-08-23 DIAGNOSIS — J301 Allergic rhinitis due to pollen: Secondary | ICD-10-CM | POA: Diagnosis not present

## 2020-08-23 DIAGNOSIS — J3081 Allergic rhinitis due to animal (cat) (dog) hair and dander: Secondary | ICD-10-CM | POA: Diagnosis not present

## 2020-08-29 ENCOUNTER — Other Ambulatory Visit: Payer: Self-pay | Admitting: Family

## 2020-08-31 IMAGING — DX DG CHEST 2V
2 series · 2 of 2 positions shown · non-contrast
Comparison: Radiograph 08/24/2019. radiograph and chest CT
08/04/2019

CLINICAL DATA: Pneumonia follow-up.

EXAM:
CHEST - 2 VIEW

[chest pa]
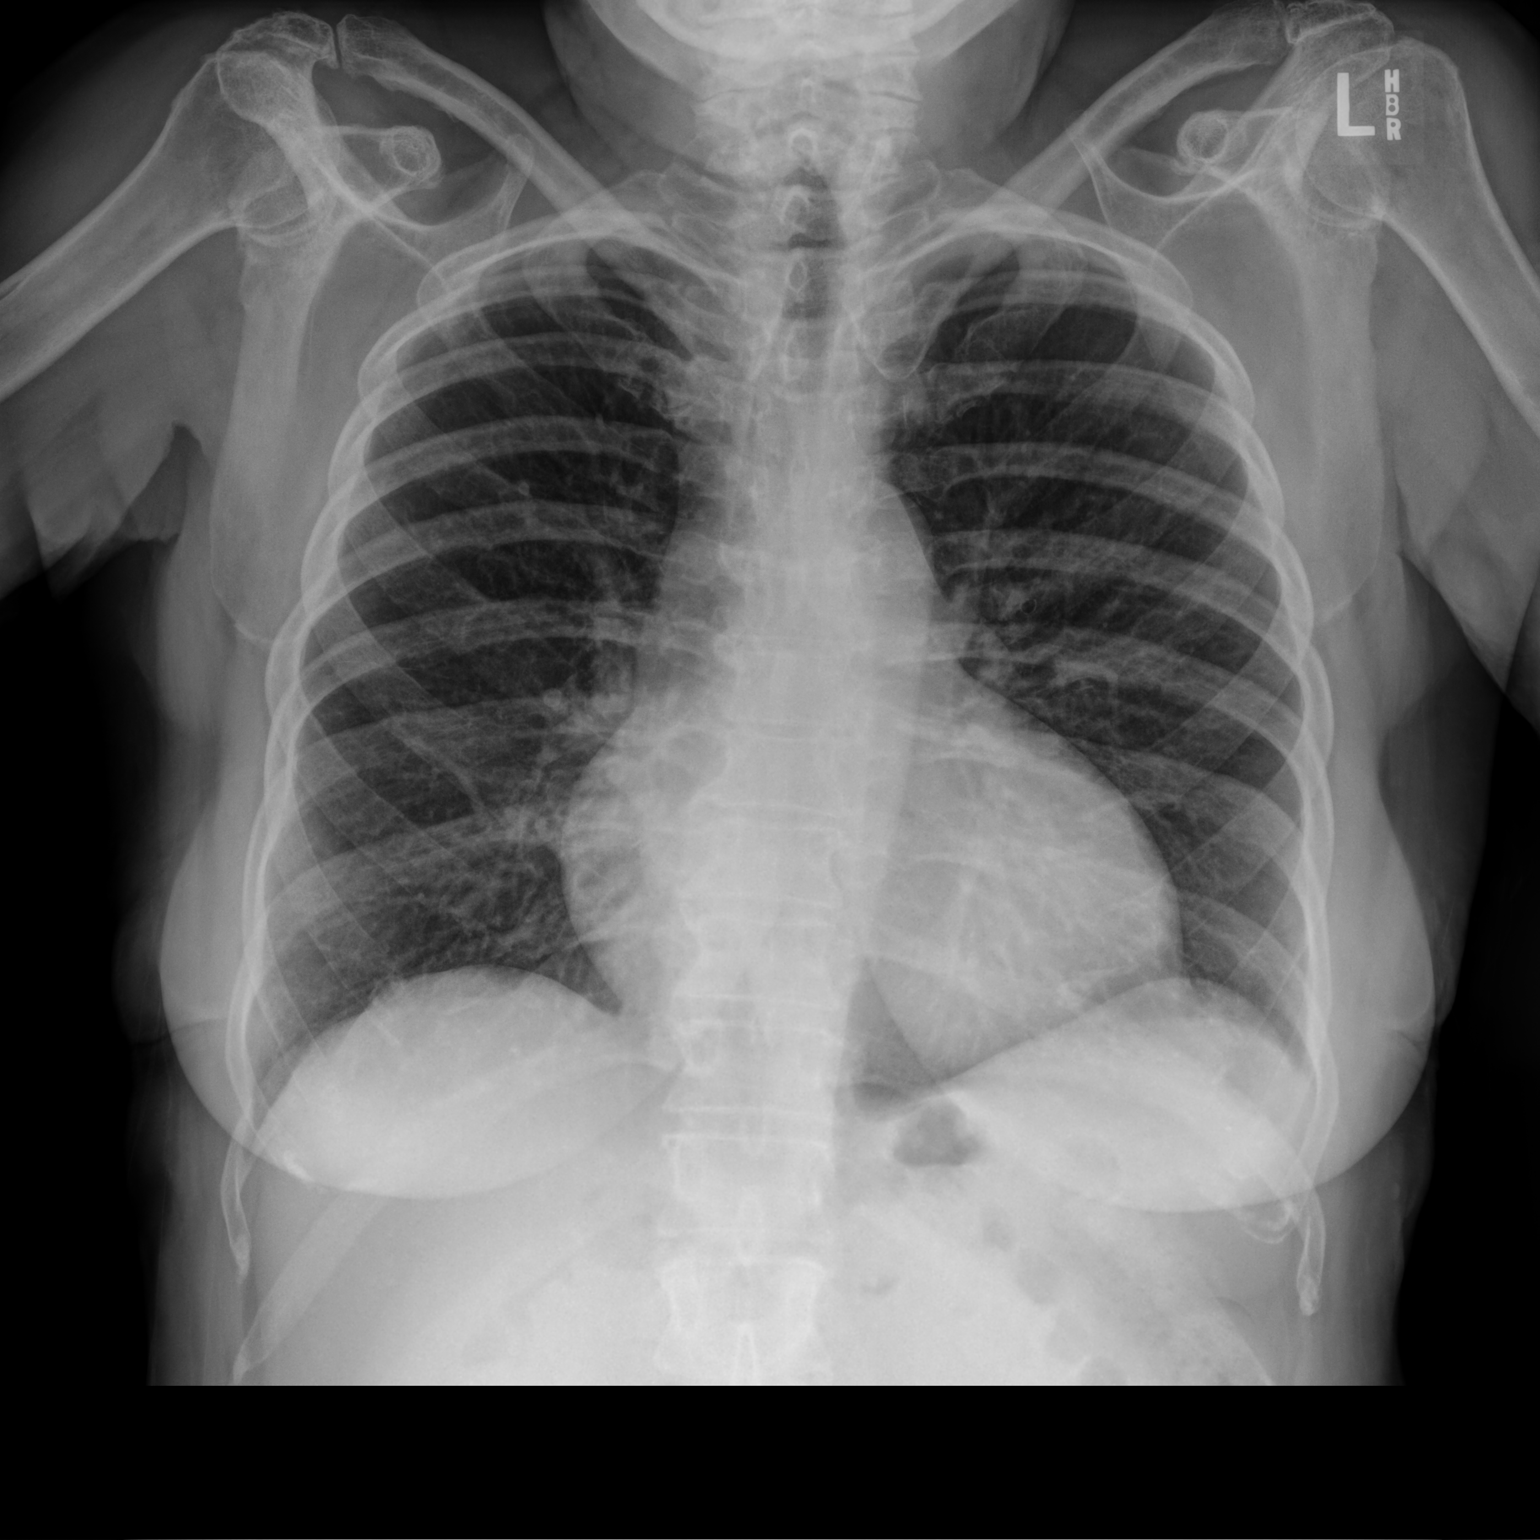

[chest lat]
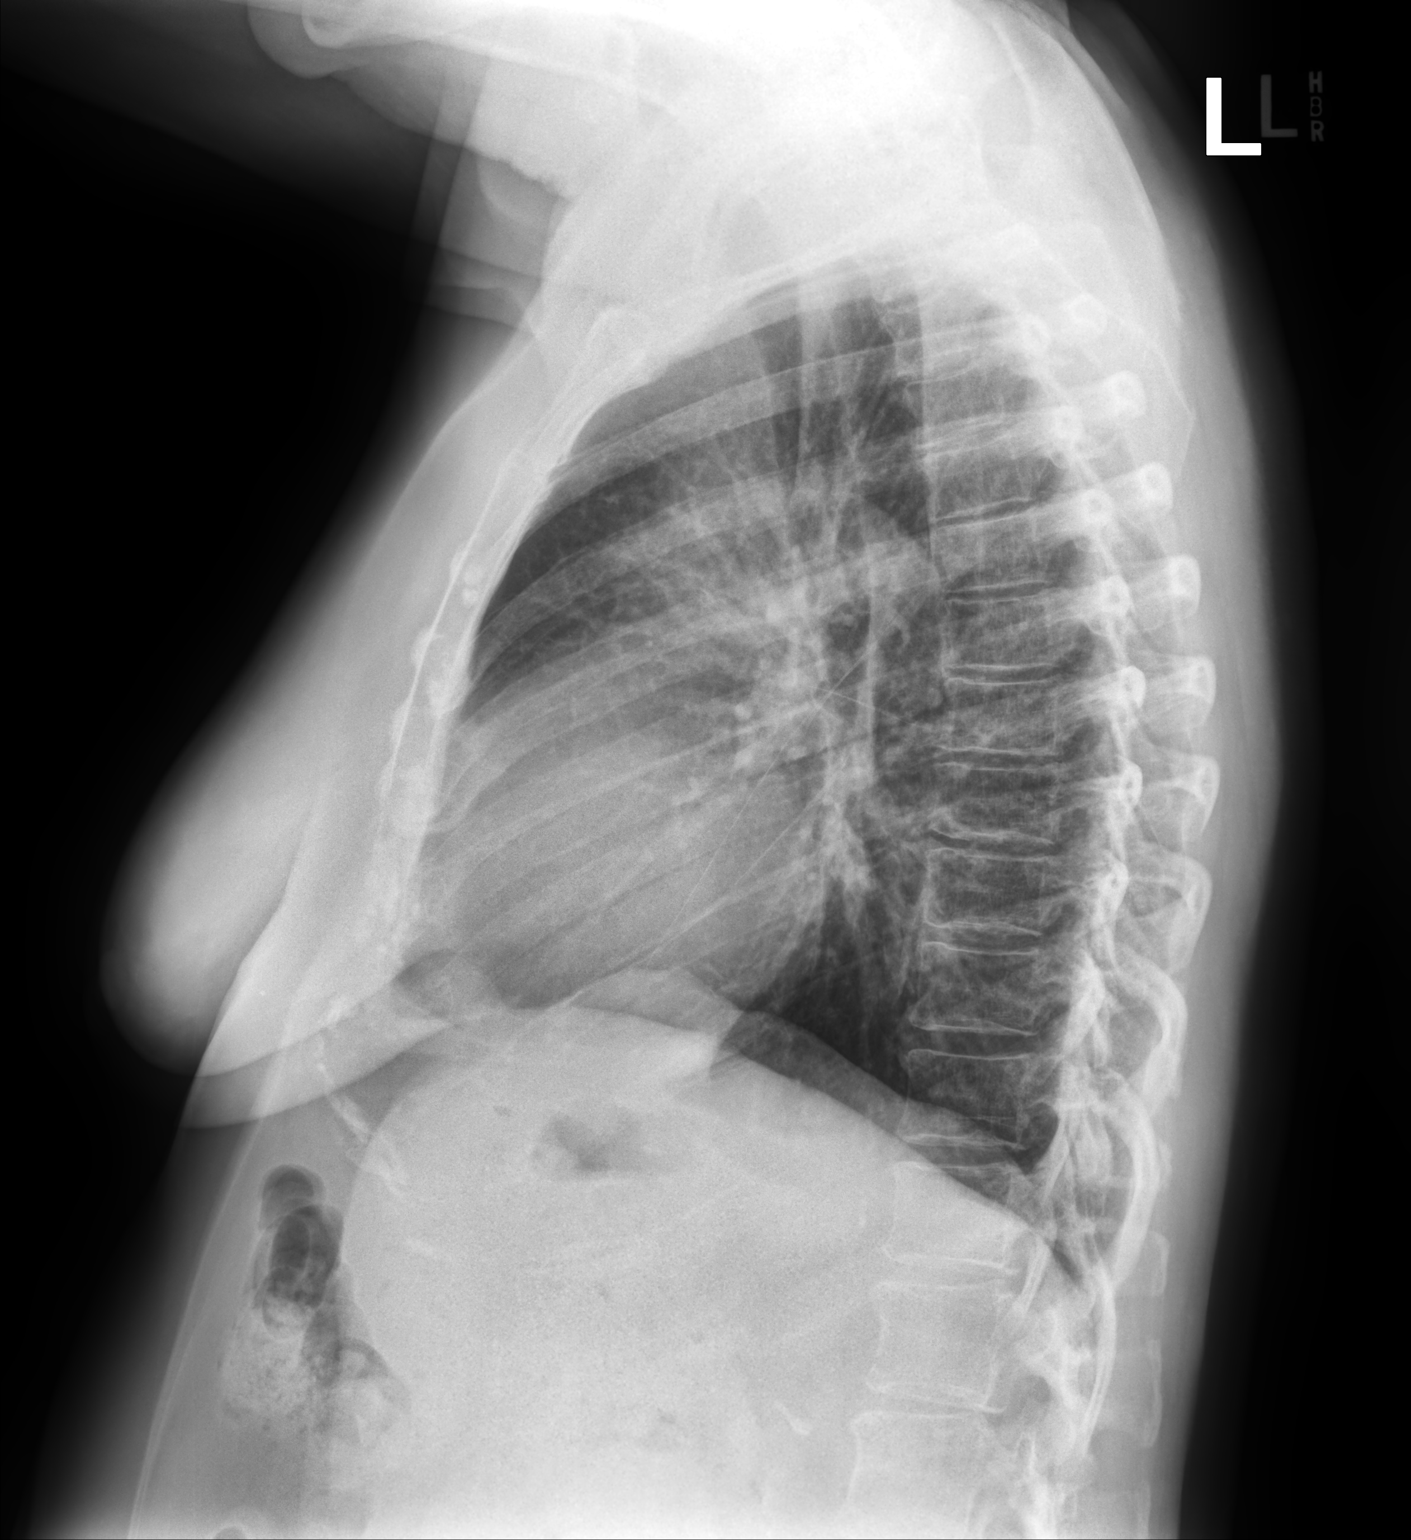

[2 of 2 positions shown; findings below may reference images not displayed]

FINDINGS: Right upper lobe pneumonia on July 2019 radiograph and CT has
resolved. No residual or evidence of underlying pulmonary mass. No
new or acute airspace disease. Borderline cardiomegaly. Unchanged
mediastinal contours. No pulmonary edema, pleural effusion or
pneumothorax. No acute osseous abnormalities are seen.
IMPRESSION: 1. Resolved right upper lobe pneumonia. No residual or evidence of
underlying pulmonary mass.
2. No acute findings.

## 2020-09-02 DIAGNOSIS — F800 Phonological disorder: Secondary | ICD-10-CM | POA: Diagnosis not present

## 2020-09-02 DIAGNOSIS — J449 Chronic obstructive pulmonary disease, unspecified: Secondary | ICD-10-CM | POA: Diagnosis not present

## 2020-09-02 DIAGNOSIS — Z7984 Long term (current) use of oral hypoglycemic drugs: Secondary | ICD-10-CM | POA: Diagnosis not present

## 2020-09-02 DIAGNOSIS — R4701 Aphasia: Secondary | ICD-10-CM | POA: Diagnosis not present

## 2020-09-02 DIAGNOSIS — E1140 Type 2 diabetes mellitus with diabetic neuropathy, unspecified: Secondary | ICD-10-CM | POA: Diagnosis not present

## 2020-09-02 DIAGNOSIS — F0280 Dementia in other diseases classified elsewhere without behavioral disturbance: Secondary | ICD-10-CM | POA: Diagnosis not present

## 2020-09-06 DIAGNOSIS — Z7984 Long term (current) use of oral hypoglycemic drugs: Secondary | ICD-10-CM | POA: Diagnosis not present

## 2020-09-06 DIAGNOSIS — J449 Chronic obstructive pulmonary disease, unspecified: Secondary | ICD-10-CM | POA: Diagnosis not present

## 2020-09-06 DIAGNOSIS — F0280 Dementia in other diseases classified elsewhere without behavioral disturbance: Secondary | ICD-10-CM | POA: Diagnosis not present

## 2020-09-06 DIAGNOSIS — F800 Phonological disorder: Secondary | ICD-10-CM | POA: Diagnosis not present

## 2020-09-06 DIAGNOSIS — R4701 Aphasia: Secondary | ICD-10-CM | POA: Diagnosis not present

## 2020-09-06 DIAGNOSIS — E1140 Type 2 diabetes mellitus with diabetic neuropathy, unspecified: Secondary | ICD-10-CM | POA: Diagnosis not present

## 2020-09-11 ENCOUNTER — Ambulatory Visit: Payer: Medicare PPO | Admitting: Nurse Practitioner

## 2020-09-12 DIAGNOSIS — J3081 Allergic rhinitis due to animal (cat) (dog) hair and dander: Secondary | ICD-10-CM | POA: Diagnosis not present

## 2020-09-12 DIAGNOSIS — J3089 Other allergic rhinitis: Secondary | ICD-10-CM | POA: Diagnosis not present

## 2020-09-12 DIAGNOSIS — J301 Allergic rhinitis due to pollen: Secondary | ICD-10-CM | POA: Diagnosis not present

## 2020-09-15 DIAGNOSIS — E1140 Type 2 diabetes mellitus with diabetic neuropathy, unspecified: Secondary | ICD-10-CM | POA: Diagnosis not present

## 2020-09-15 DIAGNOSIS — J449 Chronic obstructive pulmonary disease, unspecified: Secondary | ICD-10-CM | POA: Diagnosis not present

## 2020-09-15 DIAGNOSIS — F0280 Dementia in other diseases classified elsewhere without behavioral disturbance: Secondary | ICD-10-CM | POA: Diagnosis not present

## 2020-09-15 DIAGNOSIS — Z7984 Long term (current) use of oral hypoglycemic drugs: Secondary | ICD-10-CM | POA: Diagnosis not present

## 2020-09-15 DIAGNOSIS — R4701 Aphasia: Secondary | ICD-10-CM | POA: Diagnosis not present

## 2020-09-15 DIAGNOSIS — F800 Phonological disorder: Secondary | ICD-10-CM | POA: Diagnosis not present

## 2020-09-20 DIAGNOSIS — E1140 Type 2 diabetes mellitus with diabetic neuropathy, unspecified: Secondary | ICD-10-CM | POA: Diagnosis not present

## 2020-09-20 DIAGNOSIS — F0280 Dementia in other diseases classified elsewhere without behavioral disturbance: Secondary | ICD-10-CM | POA: Diagnosis not present

## 2020-09-20 DIAGNOSIS — Z7984 Long term (current) use of oral hypoglycemic drugs: Secondary | ICD-10-CM | POA: Diagnosis not present

## 2020-09-20 DIAGNOSIS — J449 Chronic obstructive pulmonary disease, unspecified: Secondary | ICD-10-CM | POA: Diagnosis not present

## 2020-09-20 DIAGNOSIS — F800 Phonological disorder: Secondary | ICD-10-CM | POA: Diagnosis not present

## 2020-09-20 DIAGNOSIS — R4701 Aphasia: Secondary | ICD-10-CM | POA: Diagnosis not present

## 2020-09-21 ENCOUNTER — Other Ambulatory Visit (HOSPITAL_COMMUNITY): Payer: Self-pay | Admitting: Family

## 2020-09-21 DIAGNOSIS — J3081 Allergic rhinitis due to animal (cat) (dog) hair and dander: Secondary | ICD-10-CM | POA: Diagnosis not present

## 2020-09-21 DIAGNOSIS — J301 Allergic rhinitis due to pollen: Secondary | ICD-10-CM | POA: Diagnosis not present

## 2020-09-21 DIAGNOSIS — Z1231 Encounter for screening mammogram for malignant neoplasm of breast: Secondary | ICD-10-CM

## 2020-09-21 DIAGNOSIS — J3089 Other allergic rhinitis: Secondary | ICD-10-CM | POA: Diagnosis not present

## 2020-09-27 ENCOUNTER — Other Ambulatory Visit: Payer: Self-pay | Admitting: Family

## 2020-09-28 DIAGNOSIS — L84 Corns and callosities: Secondary | ICD-10-CM | POA: Diagnosis not present

## 2020-09-28 DIAGNOSIS — J301 Allergic rhinitis due to pollen: Secondary | ICD-10-CM | POA: Diagnosis not present

## 2020-09-28 DIAGNOSIS — E1142 Type 2 diabetes mellitus with diabetic polyneuropathy: Secondary | ICD-10-CM | POA: Diagnosis not present

## 2020-09-28 DIAGNOSIS — J3089 Other allergic rhinitis: Secondary | ICD-10-CM | POA: Diagnosis not present

## 2020-09-28 DIAGNOSIS — B351 Tinea unguium: Secondary | ICD-10-CM | POA: Diagnosis not present

## 2020-09-28 DIAGNOSIS — J3081 Allergic rhinitis due to animal (cat) (dog) hair and dander: Secondary | ICD-10-CM | POA: Diagnosis not present

## 2020-09-28 DIAGNOSIS — M79676 Pain in unspecified toe(s): Secondary | ICD-10-CM | POA: Diagnosis not present

## 2020-09-28 NOTE — Telephone Encounter (Signed)
[place]. NTBS 30 days given 08/29/20

## 2020-09-29 ENCOUNTER — Other Ambulatory Visit: Payer: Self-pay

## 2020-09-29 ENCOUNTER — Ambulatory Visit (HOSPITAL_COMMUNITY)
Admission: RE | Admit: 2020-09-29 | Discharge: 2020-09-29 | Disposition: A | Discharge status: Home / Self Care | Payer: Medicare PPO | Source: Ambulatory Visit | Attending: Family | Admitting: Family

## 2020-09-29 DIAGNOSIS — Z1231 Encounter for screening mammogram for malignant neoplasm of breast: Secondary | ICD-10-CM

## 2020-10-03 ENCOUNTER — Ambulatory Visit (INDEPENDENT_AMBULATORY_CARE_PROVIDER_SITE_OTHER): Payer: Medicare PPO | Admitting: Family Medicine

## 2020-10-03 ENCOUNTER — Encounter: Payer: Self-pay | Admitting: Family Medicine

## 2020-10-03 DIAGNOSIS — Z20822 Contact with and (suspected) exposure to covid-19: Secondary | ICD-10-CM | POA: Diagnosis not present

## 2020-10-03 DIAGNOSIS — U071 COVID-19: Secondary | ICD-10-CM

## 2020-10-03 MED ORDER — CHERATUSSIN AC 100-10 MG/5ML PO SOLN: 5.0000 mL | ORAL | 0 refills | Status: DC | PRN

## 2020-10-03 MED ORDER — AZITHROMYCIN 250 MG PO TABS: ORAL_TABLET | ORAL | 0 refills | Status: DC

## 2020-10-03 MED ORDER — DEXAMETHASONE 6 MG PO TABS: 6.0000 mg | ORAL_TABLET | Freq: Two times a day (BID) | ORAL | 0 refills | Status: DC

## 2020-10-03 NOTE — Progress Notes (Signed)
Subjective:    Patient ID: Kristi Willis, female    DOB: [birth year], 80 y.o.   MRN: [id number]   HPI: Kristi Willis is a 80 y.o. female presenting for Covid tested positive today. Hx of bad asthma. Felt bad yesterday. Coughing all night. "Bad cold."  No fever or dyspnea.   Depression screen [facility] 2/9 03/23/2020 02/24/2020 02/17/2019 10/29/2018 08/31/2018  Decreased Interest 0 0 0 1 0  Down, Depressed, Hopeless 0 0 0 1 0  PHQ - 2 Score 0 0 0 2 0  Altered sleeping 1 - - 2 -  Tired, decreased energy 1 - - 1 -  Change in appetite 0 - - 1 -  Feeling bad or failure about yourself  0 - - 0 -  Trouble concentrating 0 - - 0 -  Moving slowly or fidgety/restless 0 - - 0 -  Suicidal thoughts 0 - - 0 -  PHQ-9 Score 2 - - 6 -  Difficult doing work/chores - - - - -  Some recent data might be hidden     Relevant past medical, surgical, family and social history reviewed and updated as indicated.  Interim medical history since our last visit reviewed. Allergies and medications reviewed and updated.  ROS:  Review of Systems  Constitutional: Positive for activity change, appetite change and fatigue. Negative for fever.  HENT: Positive for congestion ("bad cold") and rhinorrhea.   Respiratory: Positive for cough.   Gastrointestinal: Negative for abdominal pain, diarrhea and vomiting.     Social History   Tobacco Use  Smoking Status Never Smoker  Smokeless Tobacco Never Used       Objective:     Wt Readings from Last 3 Encounters:  03/23/20 164 lb 9.6 oz (74.7 kg)  01/24/20 164 lb 3.2 oz (74.5 kg)  12/27/19 162 lb 6.4 oz (73.7 kg)     Exam deferred. Pt. Harboring due to COVID 19. Phone visit performed.   Assessment & Plan:   1. COVID-19 virus infection     Meds ordered this encounter  Medications  . dexamethasone (DECADRON) 6 MG tablet    Sig: Take 1 tablet (6 mg total) by mouth 2 (two) times daily with a meal.    Dispense:  10 tablet    Refill:  0  . azithromycin  (ZITHROMAX Z-PAK) 250 MG tablet    Sig: Take two right away Then one a day for the next 4 days.    Dispense:  6 each    Refill:  0  . guaiFENesin-codeine (CHERATUSSIN AC) 100-10 MG/5ML syrup    Sig: Take 5 mLs by mouth every 4 (four) hours as needed for cough.    Dispense:  180 mL    Refill:  0    No orders of the defined types were placed in this encounter.     Diagnoses and all orders for this visit:  COVID-19 virus infection  Other orders -     dexamethasone (DECADRON) 6 MG tablet; Take 1 tablet (6 mg total) by mouth 2 (two) times daily with a meal. -     azithromycin (ZITHROMAX Z-PAK) 250 MG tablet; Take two right away Then one a day for the next 4 days. -     guaiFENesin-codeine (CHERATUSSIN AC) 100-10 MG/5ML syrup; Take 5 mLs by mouth every 4 (four) hours as needed for cough.    Virtual Visit via telephone Note  I discussed the limitations, risks, security and privacy concerns of performing an evaluation  and management service by telephone and the availability of in person appointments. The patient was identified with two identifiers. Pt.expressed understanding and agreed to proceed. Pt. Is at home. Her daughter, Ms. Clinton Gallant gives history since she has expressive aphasia. Dr. Livia Snellen is in his office.  Follow Up Instructions:   I discussed the assessment and treatment plan with the patient. The patient was provided an opportunity to ask questions and all were answered. The patient agreed with the plan and demonstrated an understanding of the instructions.   The patient was advised to call back or seek an in-person evaluation if the symptoms worsen or if the condition fails to improve as anticipated.   Total minutes including chart review and phone contact time: 13   Follow up plan: Return if symptoms worsen or fail to improve.  Claretta Fraise, MD [place]

## 2020-10-04 MED ORDER — AMLODIPINE BESYLATE 5 MG PO TABS: 5.0000 mg | ORAL_TABLET | Freq: Every day | ORAL | 1 refills | Status: DC

## 2020-10-05 DIAGNOSIS — J301 Allergic rhinitis due to pollen: Secondary | ICD-10-CM | POA: Diagnosis not present

## 2020-10-05 DIAGNOSIS — J3089 Other allergic rhinitis: Secondary | ICD-10-CM | POA: Diagnosis not present

## 2020-10-05 DIAGNOSIS — J3081 Allergic rhinitis due to animal (cat) (dog) hair and dander: Secondary | ICD-10-CM | POA: Diagnosis not present

## 2020-10-12 DIAGNOSIS — J301 Allergic rhinitis due to pollen: Secondary | ICD-10-CM | POA: Diagnosis not present

## 2020-10-12 DIAGNOSIS — J3089 Other allergic rhinitis: Secondary | ICD-10-CM | POA: Diagnosis not present

## 2020-10-19 ENCOUNTER — Ambulatory Visit: Payer: Medicare PPO

## 2020-10-19 DIAGNOSIS — J3081 Allergic rhinitis due to animal (cat) (dog) hair and dander: Secondary | ICD-10-CM | POA: Diagnosis not present

## 2020-10-19 DIAGNOSIS — J301 Allergic rhinitis due to pollen: Secondary | ICD-10-CM | POA: Diagnosis not present

## 2020-10-19 DIAGNOSIS — J3089 Other allergic rhinitis: Secondary | ICD-10-CM | POA: Diagnosis not present

## 2020-10-20 ENCOUNTER — Ambulatory Visit: Payer: Medicare PPO | Admitting: Family

## 2020-10-20 ENCOUNTER — Other Ambulatory Visit: Payer: Self-pay

## 2020-10-20 ENCOUNTER — Encounter: Payer: Self-pay | Admitting: Family

## 2020-10-20 VITALS — BP 110/53 | HR 64 | Temp 97.8°F | Ht 63.0 in | Wt 232.0 lb

## 2020-10-20 DIAGNOSIS — G3101 Pick's disease: Secondary | ICD-10-CM

## 2020-10-20 DIAGNOSIS — E1169 Type 2 diabetes mellitus with other specified complication: Secondary | ICD-10-CM | POA: Diagnosis not present

## 2020-10-20 DIAGNOSIS — E785 Hyperlipidemia, unspecified: Secondary | ICD-10-CM

## 2020-10-20 DIAGNOSIS — I10 Essential (primary) hypertension: Secondary | ICD-10-CM

## 2020-10-20 DIAGNOSIS — E1165 Type 2 diabetes mellitus with hyperglycemia: Secondary | ICD-10-CM | POA: Diagnosis not present

## 2020-10-20 DIAGNOSIS — J4520 Mild intermittent asthma, uncomplicated: Secondary | ICD-10-CM | POA: Diagnosis not present

## 2020-10-20 DIAGNOSIS — IMO0002 Reserved for concepts with insufficient information to code with codable children: Secondary | ICD-10-CM

## 2020-10-20 DIAGNOSIS — E039 Hypothyroidism, unspecified: Secondary | ICD-10-CM

## 2020-10-20 DIAGNOSIS — E559 Vitamin D deficiency, unspecified: Secondary | ICD-10-CM

## 2020-10-20 DIAGNOSIS — F0280 Dementia in other diseases classified elsewhere without behavioral disturbance: Secondary | ICD-10-CM | POA: Diagnosis not present

## 2020-10-20 DIAGNOSIS — F331 Major depressive disorder, recurrent, moderate: Secondary | ICD-10-CM | POA: Diagnosis not present

## 2020-10-20 DIAGNOSIS — G4700 Insomnia, unspecified: Secondary | ICD-10-CM

## 2020-10-20 DIAGNOSIS — F411 Generalized anxiety disorder: Secondary | ICD-10-CM

## 2020-10-20 DIAGNOSIS — E1140 Type 2 diabetes mellitus with diabetic neuropathy, unspecified: Secondary | ICD-10-CM | POA: Diagnosis not present

## 2020-10-20 DIAGNOSIS — Z1159 Encounter for screening for other viral diseases: Secondary | ICD-10-CM

## 2020-10-20 DIAGNOSIS — F800 Phonological disorder: Secondary | ICD-10-CM

## 2020-10-20 LAB — BAYER DCA HB A1C WAIVED: HB A1C (BAYER DCA - WAIVED): 7.5 % — ABNORMAL HIGH (ref <7.0)

## 2020-10-20 NOTE — Patient Instructions (Signed)
Health Maintenance After Age 80 After age 80, you are at a higher risk for certain long-term diseases and infections as well as injuries from falls. Falls are a major cause of broken bones and head injuries in people who are older than age 80. Getting regular preventive care can help to keep you healthy and well. Preventive care includes getting regular testing and making lifestyle changes as recommended by your health care provider. Talk with your health care provider about:  Which screenings and tests you should have. A screening is a test that checks for a disease when you have no symptoms.  A diet and exercise plan that is right for you. What should I know about screenings and tests to prevent falls? Screening and testing are the best ways to find a health problem early. Early diagnosis and treatment give you the best chance of managing medical conditions that are common after age 80. Certain conditions and lifestyle choices may make you more likely to have a fall. Your health care provider may recommend:  Regular vision checks. Poor vision and conditions such as cataracts can make you more likely to have a fall. If you wear glasses, make sure to get your prescription updated if your vision changes.  Medicine review. Work with your health care provider to regularly review all of the medicines you are taking, including over-the-counter medicines. Ask your health care provider about any side effects that may make you more likely to have a fall. Tell your health care provider if any medicines that you take make you feel dizzy or sleepy.  Osteoporosis screening. Osteoporosis is a condition that causes the bones to get weaker. This can make the bones weak and cause them to break more easily.  Blood pressure screening. Blood pressure changes and medicines to control blood pressure can make you feel dizzy.  Strength and balance checks. Your health care provider may recommend certain tests to check your  strength and balance while standing, walking, or changing positions.  Foot health exam. Foot pain and numbness, as well as not wearing proper footwear, can make you more likely to have a fall.  Depression screening. You may be more likely to have a fall if you have a fear of falling, feel emotionally low, or feel unable to do activities that you used to do.  Alcohol use screening. Using too much alcohol can affect your balance and may make you more likely to have a fall. What actions can I take to lower my risk of falls? General instructions  Talk with your health care provider about your risks for falling. Tell your health care provider if: ? You fall. Be sure to tell your health care provider about all falls, even ones that seem minor. ? You feel dizzy, sleepy, or off-balance.  Take over-the-counter and prescription medicines only as told by your health care provider. These include any supplements.  Eat a healthy diet and maintain a healthy weight. A healthy diet includes low-fat dairy products, low-fat (lean) meats, and fiber from whole grains, beans, and lots of fruits and vegetables. Home safety  Remove any tripping hazards, such as rugs, cords, and clutter.  Install safety equipment such as grab bars in bathrooms and safety rails on stairs.  Keep rooms and walkways well-lit. Activity  Follow a regular exercise program to stay fit. This will help you maintain your balance. Ask your health care provider what types of exercise are appropriate for you.  If you need a cane or walker,   use it as recommended by your health care provider.  Wear supportive shoes that have nonskid soles.   Lifestyle  Do not drink alcohol if your health care provider tells you not to drink.  If you drink alcohol, limit how much you have: ? 0-1 drink a day for women. ? 0-2 drinks a day for men.  Be aware of how much alcohol is in your drink. In the [place] one drink equals one typical bottle of beer (12  oz), one-half glass of wine (5 oz), or one shot of hard liquor (1 oz).  Do not use any products that contain nicotine or tobacco, such as cigarettes and e-cigarettes. If you need help quitting, ask your health care provider. Summary  Having a healthy lifestyle and getting preventive care can help to protect your health and wellness after age 80.  Screening and testing are the best way to find a health problem early and help you avoid having a fall. Early diagnosis and treatment give you the best chance for managing medical conditions that are more common for people who are older than age 80.  Falls are a major cause of broken bones and head injuries in people who are older than age 80. Take precautions to prevent a fall at home.  Work with your health care provider to learn what changes you can make to improve your health and wellness and to prevent falls. This information is not intended to replace advice given to you by your health care provider. Make sure you discuss any questions you have with your health care provider. Document Revised: 11/26/2018 Document Reviewed: 06/18/2017 Elsevier Patient Education  2021 Elsevier Inc.  

## 2020-10-20 NOTE — Progress Notes (Signed)
Subjective:    Patient ID: Kristi Willis, female    DOB: 11/29/1940, 80 y.o.   MRN: [id number]  Chief Complaint  Patient presents with  . Medical Management of Chronic Issues  . Hypertension   Pt and daughter present today for chronic follow up. She has aphasia so her daughter is doing most of the talking for her today. She has an ipad that she is speaking with also.   She is followed by podiatry every 6 weeks. She is followed by Cardiologists annually.  Followed by Endo for hypothyroid and DM. She is getting allergy injections weekly.  Hypertension This is a chronic problem. The current episode started more than 1 year ago. The problem has been resolved since onset. The problem is controlled. Associated symptoms include anxiety and malaise/fatigue. Pertinent negatives include no blurred vision, peripheral edema or shortness of breath. Risk factors for coronary artery disease include obesity and sedentary lifestyle. The current treatment provides moderate improvement. There is no history of CVA. Identifiable causes of hypertension include a thyroid problem.  Asthma She complains of cough. There is no shortness of breath or wheezing. This is a chronic problem. The current episode started more than 1 year ago. The problem occurs intermittently. The cough is non-productive. Associated symptoms include malaise/fatigue. She reports moderate improvement on treatment. Her past medical history is significant for asthma.  Diabetes She presents for her follow-up diabetic visit. She has type 2 diabetes mellitus. Her disease course has been stable. Associated symptoms include fatigue and foot paresthesias. Pertinent negatives for diabetes include no blurred vision. Symptoms are stable. Pertinent negatives for diabetic complications include no CVA or heart disease. Risk factors for coronary artery disease include diabetes mellitus, dyslipidemia, hypertension, sedentary lifestyle and post-menopausal.  (Does not check BS ) An ACE inhibitor/angiotensin II receptor blocker is being taken. Eye exam is current.  Thyroid Problem Presents for follow-up visit. Symptoms include depressed mood, dry skin and fatigue. Patient reports no constipation. The symptoms have been stable. Her past medical history is significant for hyperlipidemia.  Hyperlipidemia This is a chronic problem. The current episode started more than 1 year ago. The problem is controlled. Pertinent negatives include no shortness of breath. Current antihyperlipidemic treatment includes statins. The current treatment provides moderate improvement of lipids.  Insomnia Primary symptoms: difficulty falling asleep, frequent awakening, malaise/fatigue.  The current episode started more than one year. The onset quality is gradual. The problem occurs intermittently. PMH includes: depression.  Anxiety Presents for follow-up visit. Symptoms include depressed mood, insomnia, irritability and restlessness. Patient reports no shortness of breath. Symptoms occur occasionally.   Her past medical history is significant for anemia and asthma.  Depression        This is a chronic problem.  The current episode started more than 1 year ago.   The onset quality is gradual.   The problem occurs intermittently.  Associated symptoms include fatigue, insomnia, irritable, restlessness and sad.  Past medical history includes thyroid problem and anxiety.   Anemia Presents for follow-up visit. Symptoms include malaise/fatigue.      Review of Systems  Constitutional: Positive for fatigue, irritability and malaise/fatigue.  Eyes: Negative for blurred vision.  Respiratory: Positive for cough. Negative for shortness of breath and wheezing.   Gastrointestinal: Negative for constipation.  Psychiatric/Behavioral: Positive for depression. The patient has insomnia.   All other systems reviewed and are negative.      Objective:   Physical Exam Vitals reviewed.   Constitutional:  General: She is irritable. She is not in acute distress.    Appearance: She is well-developed and well-nourished.  HENT:     Head: Normocephalic and atraumatic.     Right Ear: Tympanic membrane normal.     Left Ear: Tympanic membrane normal.     Mouth/Throat:     Mouth: Oropharynx is clear and moist.  Eyes:     Pupils: Pupils are equal, round, and reactive to light.  Neck:     Thyroid: No thyromegaly.  Cardiovascular:     Rate and Rhythm: Normal rate and regular rhythm.     Pulses: Intact distal pulses.     Heart sounds: Normal heart sounds. No murmur heard.   Pulmonary:     Effort: Pulmonary effort is normal. No respiratory distress.     Breath sounds: Normal breath sounds. No wheezing.  Abdominal:     General: Bowel sounds are normal. There is no distension.     Palpations: Abdomen is soft.     Tenderness: There is no abdominal tenderness.  Musculoskeletal:        General: No tenderness or edema. Normal range of motion.     Cervical back: Normal range of motion and neck supple.  Skin:    General: Skin is warm and dry.  Neurological:     Mental Status: She is alert and oriented to person, place, and time.     Cranial Nerves: No cranial nerve deficit.     Deep Tendon Reflexes: Reflexes are normal and symmetric.  Psychiatric:        Mood and Affect: Mood and affect normal.        Speech: Delayed: aphasia.        Behavior: Behavior normal.        Thought Content: Thought content normal.        Judgment: Judgment normal.       BP (!) 110/53   Pulse 64   Temp 97.8 F (36.6 C) (Temporal)   Ht _0  (1.6 m)   Wt 232 lb (105.2 kg)   BMI 41.10 kg/m      Assessment & Plan:  Kristi Willis comes in today with chief complaint of Medical Management of Chronic Issues and Hypertension   Diagnosis and orders addressed:  1. Essential hypertension - CMP14+EGFR - CBC with Differential/Platelet  2. Mild intermittent asthma without  complication - JQG92+EFEO - CBC with Differential/Platelet  3. Hyperlipidemia associated with type 2 diabetes mellitus (HCC) - CMP14+EGFR - CBC with Differential/Platelet  4. Uncontrolled type 2 diabetes mellitus with diabetic neuropathy, without long-term current use of insulin (HCC) - Bayer DCA Hb A1c Waived - CMP14+EGFR - CBC with Differential/Platelet  5. Hypothyroidism, unspecified type - CMP14+EGFR - CBC with Differential/Platelet - TSH  6. Primary progressive aphasia ([place]) - CMP14+EGFR - CBC with Differential/Platelet  7. GAD (generalized anxiety disorder) - CMP14+EGFR - CBC with Differential/Platelet  8. Moderate episode of recurrent major depressive disorder (HCC) - CMP14+EGFR - CBC with Differential/Platelet  9. Insomnia, unspecified type - CMP14+EGFR - CBC with Differential/Platelet  10. Vitamin D deficiency - CMP14+EGFR - CBC with Differential/Platelet  11. Speech articulation disorder - CMP14+EGFR - CBC with Differential/Platelet  12. Need for hepatitis C screening test  - CMP14+EGFR - CBC with Differential/Platelet - Hepatitis C antibody   Labs pending Health Maintenance reviewed Diet and exercise encouraged  Follow up plan: 6 months    Evelina Dun, FNP

## 2020-10-21 LAB — CBC WITH DIFFERENTIAL/PLATELET
Basophils Absolute: 0.1 10*3/uL (ref 0.0–0.2)
Basos: 1 %
EOS (ABSOLUTE): 0.3 10*3/uL (ref 0.0–0.4)
Eos: 3 %
Hematocrit: 33.1 % — ABNORMAL LOW (ref 34.0–46.6)
Hemoglobin: 10.6 g/dL — ABNORMAL LOW (ref 11.1–15.9)
Immature Grans (Abs): 0 10*3/uL (ref 0.0–0.1)
Immature Granulocytes: 0 %
Lymphocytes Absolute: 2.8 10*3/uL (ref 0.7–3.1)
Lymphs: 28 %
MCH: 27.5 pg (ref 26.6–33.0)
MCHC: 32 g/dL (ref 31.5–35.7)
MCV: 86 fL (ref 79–97)
Monocytes Absolute: 0.8 10*3/uL (ref 0.1–0.9)
Monocytes: 8 %
Neutrophils Absolute: 6.3 10*3/uL (ref 1.4–7.0)
Neutrophils: 60 %
Platelets: 212 10*3/uL (ref 150–450)
RBC: 3.86 x10E6/uL (ref 3.77–5.28)
RDW: 13.4 % (ref 11.7–15.4)
WBC: 10.3 10*3/uL (ref 3.4–10.8)

## 2020-10-21 LAB — CMP14+EGFR
ALT: 21 IU/L (ref 0–32)
AST: 25 IU/L (ref 0–40)
Albumin/Globulin Ratio: 2.2 (ref 1.2–2.2)
Albumin: 4.1 g/dL (ref 3.7–4.7)
Alkaline Phosphatase: 49 IU/L (ref 44–121)
BUN/Creatinine Ratio: 26 (ref 12–28)
BUN: 15 mg/dL (ref 8–27)
Bilirubin Total: 0.3 mg/dL (ref 0.0–1.2)
CO2: 22 mmol/L (ref 20–29)
Calcium: 9.2 mg/dL (ref 8.7–10.3)
Chloride: 98 mmol/L (ref 96–106)
Creatinine, Ser: 0.58 mg/dL (ref 0.57–1.00)
Globulin, Total: 1.9 g/dL (ref 1.5–4.5)
Glucose: 146 mg/dL — ABNORMAL HIGH (ref 65–99)
Potassium: 4.3 mmol/L (ref 3.5–5.2)
Sodium: 139 mmol/L (ref 134–144)
Total Protein: 6 g/dL (ref 6.0–8.5)
eGFR: 92 mL/min/{1.73_m2} (ref >59)

## 2020-10-21 LAB — TSH: TSH: 1.14 u[IU]/mL (ref 0.450–4.500)

## 2020-10-21 LAB — HEPATITIS C ANTIBODY: Hep C Virus Ab: <0.1 s/co ratio (ref 0.0–0.9)

## 2020-10-25 ENCOUNTER — Other Ambulatory Visit: Payer: Self-pay | Admitting: Family

## 2020-10-26 DIAGNOSIS — J301 Allergic rhinitis due to pollen: Secondary | ICD-10-CM | POA: Diagnosis not present

## 2020-10-26 DIAGNOSIS — J3081 Allergic rhinitis due to animal (cat) (dog) hair and dander: Secondary | ICD-10-CM | POA: Diagnosis not present

## 2020-10-26 DIAGNOSIS — J3089 Other allergic rhinitis: Secondary | ICD-10-CM | POA: Diagnosis not present

## 2020-11-02 DIAGNOSIS — J3089 Other allergic rhinitis: Secondary | ICD-10-CM | POA: Diagnosis not present

## 2020-11-02 DIAGNOSIS — J301 Allergic rhinitis due to pollen: Secondary | ICD-10-CM | POA: Diagnosis not present

## 2020-11-02 DIAGNOSIS — J3081 Allergic rhinitis due to animal (cat) (dog) hair and dander: Secondary | ICD-10-CM | POA: Diagnosis not present

## 2020-11-09 DIAGNOSIS — J3081 Allergic rhinitis due to animal (cat) (dog) hair and dander: Secondary | ICD-10-CM | POA: Diagnosis not present

## 2020-11-09 DIAGNOSIS — J3089 Other allergic rhinitis: Secondary | ICD-10-CM | POA: Diagnosis not present

## 2020-11-09 DIAGNOSIS — J301 Allergic rhinitis due to pollen: Secondary | ICD-10-CM | POA: Diagnosis not present

## 2020-11-14 ENCOUNTER — Other Ambulatory Visit: Payer: Self-pay | Admitting: Family

## 2020-11-16 DIAGNOSIS — J3089 Other allergic rhinitis: Secondary | ICD-10-CM | POA: Diagnosis not present

## 2020-11-16 DIAGNOSIS — J301 Allergic rhinitis due to pollen: Secondary | ICD-10-CM | POA: Diagnosis not present

## 2020-11-16 DIAGNOSIS — J3081 Allergic rhinitis due to animal (cat) (dog) hair and dander: Secondary | ICD-10-CM | POA: Diagnosis not present

## 2020-11-20 ENCOUNTER — Other Ambulatory Visit: Payer: Self-pay | Admitting: Family

## 2020-11-23 ENCOUNTER — Other Ambulatory Visit: Payer: Self-pay | Admitting: Family

## 2020-11-24 DIAGNOSIS — J3089 Other allergic rhinitis: Secondary | ICD-10-CM | POA: Diagnosis not present

## 2020-11-24 DIAGNOSIS — J3081 Allergic rhinitis due to animal (cat) (dog) hair and dander: Secondary | ICD-10-CM | POA: Diagnosis not present

## 2020-11-24 DIAGNOSIS — J301 Allergic rhinitis due to pollen: Secondary | ICD-10-CM | POA: Diagnosis not present

## 2020-11-27 ENCOUNTER — Telehealth: Payer: Self-pay

## 2020-11-27 NOTE — Telephone Encounter (Signed)
[facility] calling to confirm Metoprolol prescription. Pt has been taking BID for years and the last Rx sent on 4/4 was for qd. Please call pharmacy back with clarification.

## 2020-11-28 NOTE — Telephone Encounter (Signed)
Continue with XL once a day. Monitor BP and let us know if >140/90

## 2020-11-28 NOTE — Telephone Encounter (Signed)
This was changed to metoprolol XL to help with medication adherence.

## 2020-11-28 NOTE — Telephone Encounter (Signed)
Pt and pharmacy has been informed.

## 2020-11-28 NOTE — Telephone Encounter (Signed)
Spoke with them she has been taking the xl but it was changed to once a day keep her at this or change back to twice day which they said she has been taking since July?

## 2020-11-30 DIAGNOSIS — J301 Allergic rhinitis due to pollen: Secondary | ICD-10-CM | POA: Diagnosis not present

## 2020-11-30 DIAGNOSIS — J3089 Other allergic rhinitis: Secondary | ICD-10-CM | POA: Diagnosis not present

## 2020-11-30 DIAGNOSIS — J3081 Allergic rhinitis due to animal (cat) (dog) hair and dander: Secondary | ICD-10-CM | POA: Diagnosis not present

## 2020-12-06 DIAGNOSIS — J301 Allergic rhinitis due to pollen: Secondary | ICD-10-CM | POA: Diagnosis not present

## 2020-12-06 DIAGNOSIS — J3089 Other allergic rhinitis: Secondary | ICD-10-CM | POA: Diagnosis not present

## 2020-12-06 DIAGNOSIS — J3081 Allergic rhinitis due to animal (cat) (dog) hair and dander: Secondary | ICD-10-CM | POA: Diagnosis not present

## 2020-12-12 DIAGNOSIS — J3081 Allergic rhinitis due to animal (cat) (dog) hair and dander: Secondary | ICD-10-CM | POA: Diagnosis not present

## 2020-12-12 DIAGNOSIS — J301 Allergic rhinitis due to pollen: Secondary | ICD-10-CM | POA: Diagnosis not present

## 2020-12-12 DIAGNOSIS — J3089 Other allergic rhinitis: Secondary | ICD-10-CM | POA: Diagnosis not present

## 2020-12-20 DIAGNOSIS — J3089 Other allergic rhinitis: Secondary | ICD-10-CM | POA: Diagnosis not present

## 2020-12-20 DIAGNOSIS — J301 Allergic rhinitis due to pollen: Secondary | ICD-10-CM | POA: Diagnosis not present

## 2020-12-21 ENCOUNTER — Ambulatory Visit: Payer: Medicare PPO | Admitting: Cardiology

## 2020-12-28 DIAGNOSIS — J3089 Other allergic rhinitis: Secondary | ICD-10-CM | POA: Diagnosis not present

## 2020-12-28 DIAGNOSIS — J3081 Allergic rhinitis due to animal (cat) (dog) hair and dander: Secondary | ICD-10-CM | POA: Diagnosis not present

## 2020-12-28 DIAGNOSIS — J301 Allergic rhinitis due to pollen: Secondary | ICD-10-CM | POA: Diagnosis not present

## 2021-01-04 DIAGNOSIS — L84 Corns and callosities: Secondary | ICD-10-CM | POA: Diagnosis not present

## 2021-01-04 DIAGNOSIS — B351 Tinea unguium: Secondary | ICD-10-CM | POA: Diagnosis not present

## 2021-01-04 DIAGNOSIS — J3081 Allergic rhinitis due to animal (cat) (dog) hair and dander: Secondary | ICD-10-CM | POA: Diagnosis not present

## 2021-01-04 DIAGNOSIS — M79676 Pain in unspecified toe(s): Secondary | ICD-10-CM | POA: Diagnosis not present

## 2021-01-04 DIAGNOSIS — J301 Allergic rhinitis due to pollen: Secondary | ICD-10-CM | POA: Diagnosis not present

## 2021-01-04 DIAGNOSIS — J3089 Other allergic rhinitis: Secondary | ICD-10-CM | POA: Diagnosis not present

## 2021-01-04 DIAGNOSIS — E1142 Type 2 diabetes mellitus with diabetic polyneuropathy: Secondary | ICD-10-CM | POA: Diagnosis not present

## 2021-01-11 DIAGNOSIS — J3081 Allergic rhinitis due to animal (cat) (dog) hair and dander: Secondary | ICD-10-CM | POA: Diagnosis not present

## 2021-01-11 DIAGNOSIS — J301 Allergic rhinitis due to pollen: Secondary | ICD-10-CM | POA: Diagnosis not present

## 2021-01-11 DIAGNOSIS — J3089 Other allergic rhinitis: Secondary | ICD-10-CM | POA: Diagnosis not present

## 2021-01-19 DIAGNOSIS — J3089 Other allergic rhinitis: Secondary | ICD-10-CM | POA: Diagnosis not present

## 2021-01-19 DIAGNOSIS — J301 Allergic rhinitis due to pollen: Secondary | ICD-10-CM | POA: Diagnosis not present

## 2021-01-19 DIAGNOSIS — J3081 Allergic rhinitis due to animal (cat) (dog) hair and dander: Secondary | ICD-10-CM | POA: Diagnosis not present

## 2021-01-24 ENCOUNTER — Ambulatory Visit (INDEPENDENT_AMBULATORY_CARE_PROVIDER_SITE_OTHER): Payer: Medicare PPO | Admitting: Cardiology

## 2021-01-24 ENCOUNTER — Other Ambulatory Visit: Payer: Self-pay

## 2021-01-24 ENCOUNTER — Encounter: Payer: Self-pay | Admitting: Cardiology

## 2021-01-24 VITALS — BP 132/74 | HR 64 | Ht 63.0 in | Wt 164.0 lb

## 2021-01-24 DIAGNOSIS — R600 Localized edema: Secondary | ICD-10-CM

## 2021-01-24 DIAGNOSIS — I10 Essential (primary) hypertension: Secondary | ICD-10-CM

## 2021-01-24 DIAGNOSIS — J301 Allergic rhinitis due to pollen: Secondary | ICD-10-CM | POA: Diagnosis not present

## 2021-01-24 DIAGNOSIS — E785 Hyperlipidemia, unspecified: Secondary | ICD-10-CM

## 2021-01-24 DIAGNOSIS — R079 Chest pain, unspecified: Secondary | ICD-10-CM

## 2021-01-24 DIAGNOSIS — J3089 Other allergic rhinitis: Secondary | ICD-10-CM | POA: Diagnosis not present

## 2021-01-24 DIAGNOSIS — J3081 Allergic rhinitis due to animal (cat) (dog) hair and dander: Secondary | ICD-10-CM | POA: Diagnosis not present

## 2021-01-24 NOTE — Progress Notes (Signed)
Cardiology Office Note:    Date:  01/24/2021   ID:  Kristi Willis, DOB 14-Sep-1940, MRN [id number]  PCP:  Sharion Balloon, FNP  Cardiologist:  None  Electrophysiologist:  None   Referring MD: Sharion Balloon, FNP   Chief Complaint  Patient presents with  . Edema    History of Present Illness:    Kristi Willis is a 80 y.o. female with a hx of type 2 diabetes, hypertension, hyperlipidemia, atrial tachycardia, aphasia, asthma who presents for follow-up. She wasreferred by Evelina Dun, FNP for evaluation of chest pain,initially seen on 07/28/2019. History is partially provided by her daughter given patient's aphasia. On 12/1, daughter reports that patient was walking outside and came inside reporting shortness of breath and heaviness in her chest. She went to the ED. Unfortunately had long wait in the ED so left without being seen. Troponins were drawn, which were negative x2. Reports that she has had chest heaviness intermittently since that time. Lasts a few minutes and resolves. Also has been having lower extremity edema recently. Never smoker. Sister had MI around age 81. Father had MI in 30s. Previously followed with Dr. Caryl Comes for atrial tachycardia. Would feel like heart is racing. Reports has been well controlled on Toprol-XL 100 mg twice daily.   TTE was done on 07/29/2019, which shows EF 60 to 65%, normal RV function, severe left atrial dilatation, no significant valvular disease. Lower extremity duplex was done to evaluate lower extremity edema, which showed no evidence of DVT.  Lexiscan Myoview on 11/11/2019 showed small fixed mid anteroseptal/apical anterior/apex perfusion defect, likely representing artifact given normal wall motion in that area.  Since last clinic visit, denies any chest pain.  Reports intermittent dyspnea.  Also with intermittent lightheadedness but denies any syncope.  No palpitations.  Reports occasional lower extremity  edema.    Past Medical History:  Diagnosis Date  . Allergy   . Asthma   . Cancer ([place])    skin  . Diabetes mellitus   . GERD (gastroesophageal reflux disease)    hiatal hernia  . Hypercholesteremia   . Hypertension   . Neuromuscular disorder (HCC)    DM neuropathy  . Neuropathy   . SVT (supraventricular tachycardia) (HCC)     Past Surgical History:  Procedure Laterality Date  . BREAST BIOPSY     left breast biopsy  . LUMBAR LAMINECTOMY  08/10  . SKIN CANCER EXCISION  2005  . SPINE SURGERY     lumbar laminectomy    Current Medications: Current Meds  Medication Sig  . Accu-Chek Softclix Lancets lancets CHECK BLOOD SUGAR ONCE DAILY OR AS DIRECTED Dx E11.40  . acetaminophen (TYLENOL) 325 MG tablet Take 2 tablets (650 mg total) by mouth every 6 (six) hours as needed for mild pain or fever (or Fever >/= 101).  Marland Kitchen acyclovir ointment (ZOVIRAX) 5 % APPLY TO THE AFFECTED AREA AROUND MOUTH EVERY 3 HOURS AS DIRECTED UNTIL CLEAR (Patient taking differently: Apply 1 application topically every 3 (three) hours. Using as needed for cold sores)  . albuterol (VENTOLIN HFA) 108 (90 BASE) MCG/ACT inhaler Inhale 2 puffs into the lungs every 6 (six) hours as needed for wheezing.  Marland Kitchen amLODipine (NORVASC) 5 MG tablet Take 1 tablet (5 mg total) by mouth daily.  Marland Kitchen aspirin 81 MG chewable tablet Chew 81 mg by mouth daily.  Marland Kitchen azelastine (ASTELIN) 0.1 % nasal spray Place 2 sprays into both nostrils 2 (two) times daily.  . Blood Glucose Monitoring  Suppl (ACCU-CHEK AVIVA PLUS) w/Device KIT Check sugars daily & as needed Dx E11.9  . Calcium Carbonate-Vitamin D 600-400 MG-UNIT tablet Take 1 tablet by mouth 2 (two) times daily.  Marland Kitchen doxazosin (CARDURA) 8 MG tablet Take 1 tablet (8 mg total) by mouth daily. (Needs to be seen before next refill)  . EPIPEN 2-PAK 0.3 MG/0.3ML SOAJ injection Inject 0.3 mg into the muscle as needed for anaphylaxis. Reported on 01/24/2016  . escitalopram (LEXAPRO) 20 MG tablet TAKE 1  TABLET DAILY  . gabapentin (NEURONTIN) 100 MG capsule Take 1 capsule (100 mg total) 3 (three) times daily by mouth.  Marland Kitchen glucose blood (ACCU-CHEK AVIVA PLUS) test strip CHECK BLOOD SUGAR ONCE DAILY OR AS DIRECTED Dx E11.40  . ipratropium-albuterol (DUONEB) 0.5-2.5 (3) MG/3ML SOLN Take 3 mLs by nebulization 2 (two) times daily.  Marland Kitchen ketoconazole (NIZORAL) 2 % cream APPLY TO AFFECTED AREASFON BOTTOMS OF FEET TWICE A DAY  . levothyroxine (SYNTHROID) 75 MCG tablet Take 1 tablet (75 mcg total) by mouth daily before breakfast.  . lisinopril (ZESTRIL) 40 MG tablet TAKE 1 TABLET DAILY  . loratadine (CLARITIN) 10 MG tablet Take 1 tablet (10 mg total) by mouth daily.  . metFORMIN (GLUCOPHAGE) 1000 MG tablet TAKE  (1)  TABLET TWICE A DAY WITH MEALS (BREAKFAST AND SUPPER)  . metoprolol succinate (TOPROL-XL) 100 MG 24 hr tablet Take 1 tablet (100 mg total) by mouth daily.  . montelukast (SINGULAIR) 10 MG tablet Take 1 tablet (10 mg total) by mouth at bedtime.  . Multiple Vitamin (MULTIVITAMIN) tablet Take 1 tablet by mouth daily.  . naproxen (NAPROSYN) 500 MG tablet Take 1 tablet (500 mg total) by mouth 2 (two) times daily with a meal.  . rosuvastatin (CRESTOR) 20 MG tablet Take 1 tablet (20 mg total) by mouth at bedtime.  . vitamin B-12 (CYANOCOBALAMIN) 500 MCG tablet Take 500 mcg by mouth daily.  Marland Kitchen zinc gluconate 50 MG tablet Take 50 mg by mouth daily.     Allergies:   Dimetapp c [phenylephrine-bromphen-codeine], Phenylephrine hcl, Ultram [tramadol hcl], Celebrex [celecoxib], Cortisone, Fenofibrate, and Sulfa antibiotics   Social History   Socioeconomic History  . Marital status: Widowed    Spouse name: Not on file  . Number of children: 1  . Years of education: Not on file  . Highest education level: 12th grade  Occupational History  . Occupation: Retired from [employer] but still works part-time  Tobacco Use  . Smoking status: Never Smoker  . Smokeless tobacco: Never Used  Vaping Use  . Vaping  Use: Never used  Substance and Sexual Activity  . Alcohol use: No  . Drug use: No  . Sexual activity: Never  Other Topics Concern  . Not on file  Social History Narrative  . Not on file   Social Determinants of Health   Financial Resource Strain: Not on file  Food Insecurity: Not on file  Transportation Needs: Not on file  Physical Activity: Not on file  Stress: Not on file  Social Connections: Not on file     Family History: The patient's family history includes Cancer in her father; Cancer (age of onset: 83) in her mother; Cancer (age of onset: 34) in her sister; Depression in her mother; Healthy in her daughter; Heart disease in her father; Heart disease (age of onset: 11) in her sister; Hypertension in her sister; Stroke in her father.  ROS:   Please see the history of present illness.     All other  systems reviewed and are negative.  EKGs/Labs/Other Studies Reviewed:    The following studies were reviewed today:  EKG:  EKG is ordered today.  The ekg ordered today demonstrates normal sinus rhythm, rate 64,nonspecific T wave flattening, poor R wave progression  Recent Labs: 10/20/2020: ALT 21; BUN 15; Creatinine, Ser 0.58; Hemoglobin 10.6; Platelets 212; Potassium 4.3; Sodium 139; TSH 1.140  Recent Lipid Panel    Component Value Date/Time   CHOL 115 05/02/2020 0810   CHOL 177 02/23/2013 0911   TRIG 174 (H) 05/02/2020 0810   TRIG 203 (H) 01/12/2015 0915   TRIG 293 (H) 02/23/2013 0911   HDL 40 05/02/2020 0810   HDL 40 01/12/2015 0915   HDL 48 02/23/2013 0911   CHOLHDL 2.9 05/02/2020 0810   LDLCALC 46 05/02/2020 0810   LDLCALC 119 (H) 10/06/2013 1012   LDLCALC 70 02/23/2013 0911    Physical Exam:    VS:  BP 132/74   Pulse 64   Ht 5' 3" (1.6 m)   Wt 164 lb (74.4 kg)   BMI 29.05 kg/m     Wt Readings from Last 3 Encounters:  01/24/21 164 lb (74.4 kg)  10/20/20 232 lb (105.2 kg)  03/23/20 164 lb 9.6 oz (74.7 kg)     GEN:in no acute distress HEENT:  Normal NECK: No JVD; No carotid bruits CARDIAC: RRR, no murmurs, rubs, gallops RESPIRATORY:  Clear to auscultation without rales, wheezing or rhonchi  ABDOMEN: Soft, non-tender, non-distended MUSCULOSKELETAL:  No LE edema SKIN: Warm and dry NEUROLOGIC:  Alert and oriented x 3 PSYCHIATRIC:  Normal affect   ASSESSMENT:    1. Essential hypertension   2. Chest pain, unspecified type   3. Lower extremity edema   4. Hyperlipidemia, unspecified hyperlipidemia type    PLAN:    Chest pain: typical in description, as describes chest heaviness with exertion. However, it is difficult to get a full history given patient's aphasia.   Lexiscan Myoview shows no evidence of ischemia.  Reports no recent chest pain, suspect may have been related to episode of pneumonia.  No further cardiac work-up recommended  Lower extremity edema:TTE on 07/29/2019 showed no evidence of structural heart disease. No DVT on lower extremity duplex. BNP 118.  Could be secondary to amlodipine use.  Prescribed as needed Lasix, reports has not been taking.  No edema on exam today  Hypertension: On amlodipine 5 mg daily, doxazosin 8 mg daily, lisinopril 40 mg daily, Toprol-XL 100 mg twice daily.    Appears controlled  Hyperlipidemia: On rosuvastatin 20 mg daily. LDL 46 on 05/02/20  Type 2 diabetes: On Januvia, Metformin. A1c 7.0  RTC in 1 year  Medication Adjustments/Labs and Tests Ordered: Current medicines are reviewed at length with the patient today.  Concerns regarding medicines are outlined above.  Orders Placed This Encounter  Procedures  . EKG 12-Lead   No orders of the defined types were placed in this encounter.   Patient Instructions  Medication Instructions:  Your physician recommends that you continue on your current medications as directed. Please refer to the Current Medication list given to you today.  *If you need a refill on your cardiac medications before your next appointment, please  call your pharmacy*   Follow-Up: At [facility], you and your health needs are our priority.  As part of our continuing mission to provide you with exceptional heart care, we have created designated Provider Care Teams.  These Care Teams include your primary Cardiologist (physician) and Advanced Practice  Providers (APPs -  Physician Assistants and Nurse Practitioners) who all work together to provide you with the care you need, when you need it.  We recommend signing up for the patient portal called "MyChart".  Sign up information is provided on this After Visit Summary.  MyChart is used to connect with patients for Virtual Visits (Telemedicine).  Patients are able to view lab/test results, encounter notes, upcoming appointments, etc.  Non-urgent messages can be sent to your provider as well.   To learn more about what you can do with MyChart, go to [email or web address].    Your next appointment:   12 month(s)  The format for your next appointment:   In Person  Provider:   Oswaldo Milian, MD      Signed, Donato Heinz, MD  01/24/2021 5:32 PM    [place]

## 2021-01-24 NOTE — Patient Instructions (Signed)
Medication Instructions:  Your physician recommends that you continue on your current medications as directed. Please refer to the Current Medication list given to you today.  *If you need a refill on your cardiac medications before your next appointment, please call your pharmacy*  Follow-Up: At [facility], you and your health needs are our priority.  As part of our continuing mission to provide you with exceptional heart care, we have created designated Provider Care Teams.  These Care Teams include your primary Cardiologist (physician) and Advanced Practice Providers (APPs -  Physician Assistants and Nurse Practitioners) who all work together to provide you with the care you need, when you need it.  We recommend signing up for the patient portal called "MyChart".  Sign up information is provided on this After Visit Summary.  MyChart is used to connect with patients for Virtual Visits (Telemedicine).  Patients are able to view lab/test results, encounter notes, upcoming appointments, etc.  Non-urgent messages can be sent to your provider as well.   To learn more about what you can do with MyChart, go to [email or web address].    Your next appointment:   12 month(s)  The format for your next appointment:   In Person  Provider:   Christopher Schumann, MD     

## 2021-02-02 DIAGNOSIS — J3089 Other allergic rhinitis: Secondary | ICD-10-CM | POA: Diagnosis not present

## 2021-02-02 DIAGNOSIS — J301 Allergic rhinitis due to pollen: Secondary | ICD-10-CM | POA: Diagnosis not present

## 2021-02-08 ENCOUNTER — Other Ambulatory Visit: Payer: Self-pay

## 2021-02-08 ENCOUNTER — Encounter: Payer: Self-pay | Admitting: Family Medicine

## 2021-02-08 ENCOUNTER — Ambulatory Visit (INDEPENDENT_AMBULATORY_CARE_PROVIDER_SITE_OTHER): Payer: Medicare PPO

## 2021-02-08 ENCOUNTER — Ambulatory Visit: Payer: Medicare PPO | Admitting: Family Medicine

## 2021-02-08 VITALS — BP 158/64 | HR 85 | Temp 100.4°F | Ht 63.0 in | Wt 164.6 lb

## 2021-02-08 DIAGNOSIS — S81811A Laceration without foreign body, right lower leg, initial encounter: Secondary | ICD-10-CM

## 2021-02-08 DIAGNOSIS — R062 Wheezing: Secondary | ICD-10-CM

## 2021-02-08 DIAGNOSIS — S91102A Unspecified open wound of left great toe without damage to nail, initial encounter: Secondary | ICD-10-CM | POA: Diagnosis not present

## 2021-02-08 DIAGNOSIS — R410 Disorientation, unspecified: Secondary | ICD-10-CM | POA: Diagnosis not present

## 2021-02-08 DIAGNOSIS — R8290 Unspecified abnormal findings in urine: Secondary | ICD-10-CM | POA: Diagnosis not present

## 2021-02-08 DIAGNOSIS — R509 Fever, unspecified: Secondary | ICD-10-CM

## 2021-02-08 DIAGNOSIS — I517 Cardiomegaly: Secondary | ICD-10-CM | POA: Diagnosis not present

## 2021-02-08 LAB — MICROSCOPIC EXAMINATION

## 2021-02-08 LAB — URINALYSIS, ROUTINE W REFLEX MICROSCOPIC
Bilirubin, UA: NEGATIVE
Glucose, UA: NEGATIVE
Ketones, UA: NEGATIVE
Leukocytes,UA: NEGATIVE
Nitrite, UA: NEGATIVE
Specific Gravity, UA: 1.02 (ref 1.005–1.030)
Urobilinogen, Ur: 0.2 mg/dL (ref 0.2–1.0)
pH, UA: 5 (ref 5.0–7.5)

## 2021-02-08 LAB — VERITOR FLU A/B WAIVED
Influenza A: NEGATIVE
Influenza B: NEGATIVE

## 2021-02-08 MED ORDER — ALBUTEROL SULFATE HFA 108 (90 BASE) MCG/ACT IN AERS: 2.0000 | INHALATION_SPRAY | Freq: Four times a day (QID) | RESPIRATORY_TRACT | 3 refills | Status: DC | PRN

## 2021-02-08 NOTE — Progress Notes (Signed)
Assessment & Plan:  1. Open wound of left great toe, initial encounter Keep wound clean, dry, and covered. Encouraged to follow-up with podiatry. Return if not healing.  2. Skin tear of right lower leg without complication, initial encounter Keep clean and dry.  3. Disoriented - Urinalysis, Routine w reflex microscopic - Microscopic Examination  4. Abnormal urinalysis - Urine Culture  5. Fever, unspecified fever cause - Novel Coronavirus, NAA ([facility]); Future - Veritor Flu A/B Waived - Novel Coronavirus, NAA ([facility])  6. Wheezing - DG Chest 2 View - albuterol (VENTOLIN HFA) 108 (90 Base) MCG/ACT inhaler; Inhale 2 puffs into the lungs every 6 (six) hours as needed for wheezing or shortness of breath.  Dispense: 1 each; Refill: 3   Follow up plan: Return if symptoms worsen or fail to improve.  Hendricks Limes, MSN, APRN, FNP-C [facility]  Subjective:   Patient ID: Kristi Willis, female    DOB: 10/13/1940, 80 y.o.   MRN: [id number]  HPI: Kristi Willis is a 80 y.o. female presenting on 02/08/2021 for Wheezing (States it started yesterday afternoon /) and Foot Pain (Left big toe )  Patient is accompanied by her daughter who she is okay with being present. Patient has aphasia and a speech articulation disorder, so her daughter gives most of the history. Patient is able to use a tablet to communicate additional information that her daughter did not give.  Appointment was initially made due to a wound on the bottom of the left great toe. Patient has been applying antibiotic ointment and covering with a Band-Aid. She does have diabetes. She also has a podiatrist.   Patient reports a skin tear on her right leg she would like looked at.   Yesterday patient started wheezing and complaining of abdominal pain. She denies nausea, vomiting, and diarrhea. Her daughter reports she has been disoriented today. When asked to clarify she states she has been  off balance and not walking well. Patient lives alone and typically does everything for herself. Patient's daughter also reports she sounded congested this morning on the phone. She has been using Albuterol every 4 hours. Daughter would like a chest x-ray as she reports this is how patient appeared last year when she had pneumonia. Patient is running a fever in office today - they were previously unaware of this.    ROS: Negative unless specifically indicated above in HPI.   Relevant past medical history reviewed and updated as indicated.   Allergies and medications reviewed and updated.   Current Outpatient Medications:    Accu-Chek Softclix Lancets lancets, CHECK BLOOD SUGAR ONCE DAILY OR AS DIRECTED Dx E11.40, Disp: 100 each, Rfl: 3   acetaminophen (TYLENOL) 325 MG tablet, Take 2 tablets (650 mg total) by mouth every 6 (six) hours as needed for mild pain or fever (or Fever >/= 101)., Disp: 60 tablet, Rfl: 0   acyclovir ointment (ZOVIRAX) 5 %, APPLY TO THE AFFECTED AREA AROUND MOUTH EVERY 3 HOURS AS DIRECTED UNTIL CLEAR (Patient taking differently: Apply 1 application topically every 3 (three) hours. Using as needed for cold sores), Disp: 15 g, Rfl: 0   albuterol (VENTOLIN HFA) 108 (90 BASE) MCG/ACT inhaler, Inhale 2 puffs into the lungs every 6 (six) hours as needed for wheezing., Disp: 1 Inhaler, Rfl: 3   amLODipine (NORVASC) 5 MG tablet, Take 1 tablet (5 mg total) by mouth daily., Disp: 30 tablet, Rfl: 4   aspirin 81 MG chewable tablet, Chew 81 mg by  mouth daily., Disp: , Rfl:    azelastine (ASTELIN) 0.1 % nasal spray, Place 2 sprays into both nostrils 2 (two) times daily., Disp: 30 mL, Rfl: 11   Blood Glucose Monitoring Suppl (ACCU-CHEK AVIVA PLUS) w/Device KIT, Check sugars daily & as needed Dx E11.9, Disp: 1 kit, Rfl: 0   Calcium Carbonate-Vitamin D 600-400 MG-UNIT tablet, Take 1 tablet by mouth 2 (two) times daily., Disp: , Rfl:    doxazosin (CARDURA) 8 MG tablet, Take 1 tablet (8 mg  total) by mouth daily. (Needs to be seen before next refill), Disp: 30 tablet, Rfl: 0   EPIPEN 2-PAK 0.3 MG/0.3ML SOAJ injection, Inject 0.3 mg into the muscle as needed for anaphylaxis. Reported on 01/24/2016, Disp: , Rfl:    escitalopram (LEXAPRO) 20 MG tablet, TAKE 1 TABLET DAILY, Disp: 30 tablet, Rfl: 2   gabapentin (NEURONTIN) 100 MG capsule, Take 1 capsule (100 mg total) 3 (three) times daily by mouth., Disp: 90 capsule, Rfl: 3   glucose blood (ACCU-CHEK AVIVA PLUS) test strip, CHECK BLOOD SUGAR ONCE DAILY OR AS DIRECTED Dx E11.40, Disp: 100 strip, Rfl: 3   ipratropium-albuterol (DUONEB) 0.5-2.5 (3) MG/3ML SOLN, Take 3 mLs by nebulization 2 (two) times daily., Disp: 125 mL, Rfl: 0   ketoconazole (NIZORAL) 2 % cream, APPLY TO AFFECTED AREASFON BOTTOMS OF FEET TWICE A DAY, Disp: , Rfl:    levothyroxine (SYNTHROID) 75 MCG tablet, Take 1 tablet (75 mcg total) by mouth daily before breakfast., Disp: 90 tablet, Rfl: 3   lisinopril (ZESTRIL) 40 MG tablet, TAKE 1 TABLET DAILY, Disp: 90 tablet, Rfl: 1   loratadine (CLARITIN) 10 MG tablet, Take 1 tablet (10 mg total) by mouth daily., Disp: 30 tablet, Rfl: 11   metFORMIN (GLUCOPHAGE) 1000 MG tablet, TAKE  (1)  TABLET TWICE A DAY WITH MEALS (BREAKFAST AND SUPPER), Disp: 180 tablet, Rfl: 3   metoprolol succinate (TOPROL-XL) 100 MG 24 hr tablet, Take 1 tablet (100 mg total) by mouth daily., Disp: 90 tablet, Rfl: 1   montelukast (SINGULAIR) 10 MG tablet, Take 1 tablet (10 mg total) by mouth at bedtime., Disp: 30 tablet, Rfl: 5   Multiple Vitamin (MULTIVITAMIN) tablet, Take 1 tablet by mouth daily., Disp: , Rfl:    naproxen (NAPROSYN) 500 MG tablet, Take 1 tablet (500 mg total) by mouth 2 (two) times daily with a meal., Disp: 30 tablet, Rfl: 0   rosuvastatin (CRESTOR) 20 MG tablet, Take 1 tablet (20 mg total) by mouth at bedtime., Disp: 90 tablet, Rfl: 3   vitamin B-12 (CYANOCOBALAMIN) 500 MCG tablet, Take 500 mcg by mouth daily., Disp: , Rfl:    zinc gluconate  50 MG tablet, Take 50 mg by mouth daily., Disp: , Rfl:    furosemide (LASIX) 20 MG tablet, Take 1 tablet (20 mg total) by mouth daily as needed for fluid (Weight gain of 3 lbs in a day or 5 lbs in a week)., Disp: 30 tablet, Rfl: 11  Allergies  Allergen Reactions   Dimetapp C [Phenylephrine-Bromphen-Codeine] Itching   Phenylephrine Hcl Other (See Comments)    unknown   Ultram [Tramadol Hcl] Other (See Comments)    Insomnia   Celebrex [Celecoxib] Rash   Cortisone Rash   Fenofibrate Rash   Sulfa Antibiotics Rash    Objective:   BP (!) 158/64   Pulse 85   Temp (!) 100.4 F (38 C) (Temporal)   Ht $R'5\' 3"'Ke$  (1.6 m)   Wt 164 lb 9.6 oz (74.7 kg)   SpO2 94%  BMI 29.16 kg/m    Physical Exam Vitals reviewed.  Constitutional:      General: She is not in acute distress.    Appearance: Normal appearance. She is not ill-appearing, toxic-appearing or diaphoretic.  HENT:     Head: Normocephalic and atraumatic.     Right Ear: Tympanic membrane, ear canal and external ear normal. There is no impacted cerumen.     Left Ear: Tympanic membrane, ear canal and external ear normal. There is no impacted cerumen.     Mouth/Throat:     Mouth: Mucous membranes are moist.     Pharynx: Oropharynx is clear. No oropharyngeal exudate or posterior oropharyngeal erythema.  Eyes:     General: No scleral icterus.       Right eye: No discharge.        Left eye: No discharge.     Conjunctiva/sclera: Conjunctivae normal.  Cardiovascular:     Rate and Rhythm: Normal rate and regular rhythm.     Heart sounds: Normal heart sounds. No murmur heard.   No friction rub. No gallop.  Pulmonary:     Effort: Pulmonary effort is normal. No respiratory distress.     Breath sounds: Normal breath sounds. No stridor. No wheezing, rhonchi or rales.  Musculoskeletal:        General: Normal range of motion.     Cervical back: Normal range of motion.  Lymphadenopathy:     Cervical: No cervical adenopathy.  Skin:     General: Skin is warm and dry.     Capillary Refill: Capillary refill takes less than 2 seconds.     Findings: Wound (bottom of left great toe - no surrounding erythema, warmth, swelling or odor. Serous drainage.) present.     Comments: Small skin tear to right lower leg without erythema, warmth, drainage, swelling, or odor. Well approximated.  Neurological:     General: No focal deficit present.     Mental Status: She is alert and oriented to person, place, and time. Mental status is at baseline.  Psychiatric:        Mood and Affect: Mood normal.        Behavior: Behavior normal.        Thought Content: Thought content normal.        Judgment: Judgment normal.

## 2021-02-08 NOTE — Patient Instructions (Signed)
Tylenol for pain/fever. Albuterol for wheezing, shortness of breath, or chest tightness.  Cleanse skin tears daily with mild soap and water. Keep covered.

## 2021-02-09 ENCOUNTER — Encounter: Payer: Self-pay | Admitting: Family Medicine

## 2021-02-09 LAB — SARS-COV-2, NAA 2 DAY TAT

## 2021-02-09 LAB — NOVEL CORONAVIRUS, NAA: SARS-CoV-2, NAA: NOT DETECTED

## 2021-02-09 MED ORDER — ALBUTEROL SULFATE (2.5 MG/3ML) 0.083% IN NEBU
2.5000 mg | INHALATION_SOLUTION | Freq: Four times a day (QID) | RESPIRATORY_TRACT | 1 refills | Status: DC | PRN
Start: 2021-02-09 — End: 2021-10-05

## 2021-02-10 ENCOUNTER — Inpatient Hospital Stay (HOSPITAL_BASED_OUTPATIENT_CLINIC_OR_DEPARTMENT_OTHER)
Admission: EM | Admit: 2021-02-10 | Discharge: 2021-02-12 | DRG: 641 | Disposition: A | Discharge status: Home / Self Care | Payer: Medicare PPO | Attending: Family Medicine | Admitting: Family Medicine

## 2021-02-10 ENCOUNTER — Other Ambulatory Visit: Payer: Self-pay

## 2021-02-10 ENCOUNTER — Emergency Department (HOSPITAL_BASED_OUTPATIENT_CLINIC_OR_DEPARTMENT_OTHER): Payer: Medicare PPO | Admitting: Radiology

## 2021-02-10 ENCOUNTER — Emergency Department (HOSPITAL_BASED_OUTPATIENT_CLINIC_OR_DEPARTMENT_OTHER): Payer: Medicare PPO

## 2021-02-10 ENCOUNTER — Encounter (HOSPITAL_BASED_OUTPATIENT_CLINIC_OR_DEPARTMENT_OTHER): Payer: Self-pay | Admitting: *Deleted

## 2021-02-10 DIAGNOSIS — I517 Cardiomegaly: Secondary | ICD-10-CM | POA: Diagnosis not present

## 2021-02-10 DIAGNOSIS — Z882 Allergy status to sulfonamides status: Secondary | ICD-10-CM | POA: Diagnosis not present

## 2021-02-10 DIAGNOSIS — K219 Gastro-esophageal reflux disease without esophagitis: Secondary | ICD-10-CM | POA: Diagnosis present

## 2021-02-10 DIAGNOSIS — F32A Depression, unspecified: Secondary | ICD-10-CM | POA: Diagnosis present

## 2021-02-10 DIAGNOSIS — E785 Hyperlipidemia, unspecified: Secondary | ICD-10-CM | POA: Diagnosis present

## 2021-02-10 DIAGNOSIS — D649 Anemia, unspecified: Secondary | ICD-10-CM | POA: Diagnosis not present

## 2021-02-10 DIAGNOSIS — E039 Hypothyroidism, unspecified: Secondary | ICD-10-CM | POA: Diagnosis present

## 2021-02-10 DIAGNOSIS — E1169 Type 2 diabetes mellitus with other specified complication: Secondary | ICD-10-CM | POA: Diagnosis not present

## 2021-02-10 DIAGNOSIS — R059 Cough, unspecified: Secondary | ICD-10-CM | POA: Diagnosis not present

## 2021-02-10 DIAGNOSIS — E1165 Type 2 diabetes mellitus with hyperglycemia: Secondary | ICD-10-CM | POA: Diagnosis not present

## 2021-02-10 DIAGNOSIS — N390 Urinary tract infection, site not specified: Secondary | ICD-10-CM | POA: Diagnosis present

## 2021-02-10 DIAGNOSIS — I10 Essential (primary) hypertension: Secondary | ICD-10-CM | POA: Diagnosis not present

## 2021-02-10 DIAGNOSIS — IMO0002 Reserved for concepts with insufficient information to code with codable children: Secondary | ICD-10-CM | POA: Diagnosis present

## 2021-02-10 DIAGNOSIS — E1140 Type 2 diabetes mellitus with diabetic neuropathy, unspecified: Secondary | ICD-10-CM | POA: Diagnosis present

## 2021-02-10 DIAGNOSIS — Z85828 Personal history of other malignant neoplasm of skin: Secondary | ICD-10-CM

## 2021-02-10 DIAGNOSIS — Z20822 Contact with and (suspected) exposure to covid-19: Secondary | ICD-10-CM | POA: Diagnosis present

## 2021-02-10 DIAGNOSIS — F0280 Dementia in other diseases classified elsewhere without behavioral disturbance: Secondary | ICD-10-CM | POA: Diagnosis present

## 2021-02-10 DIAGNOSIS — E871 Hypo-osmolality and hyponatremia: Secondary | ICD-10-CM | POA: Diagnosis not present

## 2021-02-10 DIAGNOSIS — Z79890 Hormone replacement therapy (postmenopausal): Secondary | ICD-10-CM

## 2021-02-10 DIAGNOSIS — Z888 Allergy status to other drugs, medicaments and biological substances status: Secondary | ICD-10-CM | POA: Diagnosis not present

## 2021-02-10 DIAGNOSIS — F800 Phonological disorder: Secondary | ICD-10-CM | POA: Diagnosis present

## 2021-02-10 DIAGNOSIS — I700 Atherosclerosis of aorta: Secondary | ICD-10-CM | POA: Diagnosis not present

## 2021-02-10 DIAGNOSIS — J45909 Unspecified asthma, uncomplicated: Secondary | ICD-10-CM | POA: Diagnosis not present

## 2021-02-10 DIAGNOSIS — Z818 Family history of other mental and behavioral disorders: Secondary | ICD-10-CM | POA: Diagnosis not present

## 2021-02-10 DIAGNOSIS — Z885 Allergy status to narcotic agent status: Secondary | ICD-10-CM

## 2021-02-10 DIAGNOSIS — G3101 Pick's disease: Secondary | ICD-10-CM | POA: Diagnosis not present

## 2021-02-10 DIAGNOSIS — E878 Other disorders of electrolyte and fluid balance, not elsewhere classified: Secondary | ICD-10-CM | POA: Diagnosis present

## 2021-02-10 DIAGNOSIS — Z791 Long term (current) use of non-steroidal anti-inflammatories (NSAID): Secondary | ICD-10-CM

## 2021-02-10 DIAGNOSIS — Z79899 Other long term (current) drug therapy: Secondary | ICD-10-CM

## 2021-02-10 DIAGNOSIS — Z8249 Family history of ischemic heart disease and other diseases of the circulatory system: Secondary | ICD-10-CM

## 2021-02-10 DIAGNOSIS — R509 Fever, unspecified: Secondary | ICD-10-CM

## 2021-02-10 DIAGNOSIS — I509 Heart failure, unspecified: Secondary | ICD-10-CM | POA: Diagnosis not present

## 2021-02-10 DIAGNOSIS — R599 Enlarged lymph nodes, unspecified: Secondary | ICD-10-CM | POA: Diagnosis not present

## 2021-02-10 DIAGNOSIS — J90 Pleural effusion, not elsewhere classified: Secondary | ICD-10-CM | POA: Diagnosis not present

## 2021-02-10 DIAGNOSIS — F419 Anxiety disorder, unspecified: Secondary | ICD-10-CM | POA: Diagnosis present

## 2021-02-10 DIAGNOSIS — Z7982 Long term (current) use of aspirin: Secondary | ICD-10-CM

## 2021-02-10 DIAGNOSIS — Z886 Allergy status to analgesic agent status: Secondary | ICD-10-CM

## 2021-02-10 DIAGNOSIS — E119 Type 2 diabetes mellitus without complications: Secondary | ICD-10-CM | POA: Diagnosis present

## 2021-02-10 DIAGNOSIS — D509 Iron deficiency anemia, unspecified: Secondary | ICD-10-CM | POA: Diagnosis present

## 2021-02-10 DIAGNOSIS — I5033 Acute on chronic diastolic (congestive) heart failure: Secondary | ICD-10-CM | POA: Diagnosis not present

## 2021-02-10 DIAGNOSIS — Z7984 Long term (current) use of oral hypoglycemic drugs: Secondary | ICD-10-CM

## 2021-02-10 LAB — COMPREHENSIVE METABOLIC PANEL
ALT: 30 U/L (ref 0–44)
AST: 33 U/L (ref 15–41)
Albumin: 3.9 g/dL (ref 3.5–5.0)
Alkaline Phosphatase: 63 U/L (ref 38–126)
Anion gap: 11 (ref 5–15)
BUN: 11 mg/dL (ref 8–23)
CO2: 25 mmol/L (ref 22–32)
Calcium: 8.6 mg/dL — ABNORMAL LOW (ref 8.9–10.3)
Chloride: 87 mmol/L — ABNORMAL LOW (ref 98–111)
Creatinine, Ser: 0.67 mg/dL (ref 0.44–1.00)
GFR, Estimated: >60 mL/min (ref >60)
Glucose, Bld: 222 mg/dL — ABNORMAL HIGH (ref 70–99)
Potassium: 3.9 mmol/L (ref 3.5–5.1)
Sodium: 123 mmol/L — ABNORMAL LOW (ref 135–145)
Total Bilirubin: 1.3 mg/dL — ABNORMAL HIGH (ref 0.3–1.2)
Total Protein: 6.2 g/dL — ABNORMAL LOW (ref 6.5–8.1)

## 2021-02-10 LAB — GLUCOSE, CAPILLARY: Glucose-Capillary: 244 mg/dL — ABNORMAL HIGH (ref 70–99)

## 2021-02-10 LAB — URINALYSIS, ROUTINE W REFLEX MICROSCOPIC
Bilirubin Urine: NEGATIVE
Glucose, UA: NEGATIVE mg/dL
Hgb urine dipstick: NEGATIVE
Ketones, ur: NEGATIVE mg/dL
Nitrite: NEGATIVE
Protein, ur: 30 mg/dL — AB
Specific Gravity, Urine: 1.009 (ref 1.005–1.030)
pH: 5.5 (ref 5.0–8.0)

## 2021-02-10 LAB — CBC WITH DIFFERENTIAL/PLATELET
Abs Immature Granulocytes: 0.04 10*3/uL (ref 0.00–0.07)
Basophils Absolute: 0 10*3/uL (ref 0.0–0.1)
Basophils Relative: 0 %
Eosinophils Absolute: 0 10*3/uL (ref 0.0–0.5)
Eosinophils Relative: 0 %
HCT: 25.4 % — ABNORMAL LOW (ref 36.0–46.0)
Hemoglobin: 8.7 g/dL — ABNORMAL LOW (ref 12.0–15.0)
Immature Granulocytes: 0 %
Lymphocytes Relative: 12 %
Lymphs Abs: 1.3 10*3/uL (ref 0.7–4.0)
MCH: 27.6 pg (ref 26.0–34.0)
MCHC: 34.3 g/dL (ref 30.0–36.0)
MCV: 80.6 fL (ref 80.0–100.0)
Monocytes Absolute: 1.1 10*3/uL — ABNORMAL HIGH (ref 0.1–1.0)
Monocytes Relative: 10 %
Neutro Abs: 8.4 10*3/uL — ABNORMAL HIGH (ref 1.7–7.7)
Neutrophils Relative %: 78 %
Platelets: 155 10*3/uL (ref 150–400)
RBC: 3.15 MIL/uL — ABNORMAL LOW (ref 3.87–5.11)
RDW: 14 % (ref 11.5–15.5)
WBC: 10.8 10*3/uL — ABNORMAL HIGH (ref 4.0–10.5)
nRBC: 0 % (ref 0.0–0.2)

## 2021-02-10 LAB — BRAIN NATRIURETIC PEPTIDE: B Natriuretic Peptide: 182.3 pg/mL — ABNORMAL HIGH (ref 0.0–100.0)

## 2021-02-10 LAB — RESP PANEL BY RT-PCR (FLU A&B, COVID) ARPGX2
Influenza A by PCR: NEGATIVE
Influenza B by PCR: NEGATIVE
SARS Coronavirus 2 by RT PCR: NEGATIVE

## 2021-02-10 LAB — URINE CULTURE: Organism ID, Bacteria: NO GROWTH

## 2021-02-10 LAB — TROPONIN I (HIGH SENSITIVITY)
Troponin I (High Sensitivity): 23 ng/L — ABNORMAL HIGH
Troponin I (High Sensitivity): 20 ng/L — ABNORMAL HIGH (ref <18)

## 2021-02-10 MED ORDER — FUROSEMIDE 10 MG/ML IJ SOLN
40.0000 mg | Freq: Every day | INTRAMUSCULAR | Status: DC
  Administered 2021-02-11: 40 mg via INTRAVENOUS
  Filled 2021-02-10: qty 4

## 2021-02-10 MED ORDER — INSULIN ASPART 100 UNIT/ML IJ SOLN
0.0000 [IU] | Freq: Three times a day (TID) | INTRAMUSCULAR | Status: DC
  Administered 2021-02-11: 3 [IU] via SUBCUTANEOUS
  Administered 2021-02-11: 11 [IU] via SUBCUTANEOUS
  Administered 2021-02-11 – 2021-02-12 (×2): 3 [IU] via SUBCUTANEOUS

## 2021-02-10 MED ORDER — LORATADINE 10 MG PO TABS
10.0000 mg | ORAL_TABLET | Freq: Every day | ORAL | Status: DC
  Administered 2021-02-11 – 2021-02-12 (×2): 10 mg via ORAL
  Filled 2021-02-10 (×2): qty 1

## 2021-02-10 MED ORDER — FUROSEMIDE 10 MG/ML IJ SOLN
40.0000 mg | Freq: Once | INTRAMUSCULAR | Status: AC
  Administered 2021-02-10: 40 mg via INTRAVENOUS
  Filled 2021-02-10: qty 4

## 2021-02-10 MED ORDER — METOPROLOL SUCCINATE ER 100 MG PO TB24
100.0000 mg | ORAL_TABLET | Freq: Every day | ORAL | Status: DC
  Administered 2021-02-11 – 2021-02-12 (×2): 100 mg via ORAL
  Filled 2021-02-10 (×2): qty 1

## 2021-02-10 MED ORDER — IOHEXOL 350 MG/ML SOLN
75.0000 mL | Freq: Once | INTRAVENOUS | Status: AC | PRN
  Administered 2021-02-10: 75 mL via INTRAVENOUS

## 2021-02-10 MED ORDER — SODIUM CHLORIDE 0.9 % IV SOLN
1.0000 g | INTRAVENOUS | Status: DC
  Filled 2021-02-10: qty 10

## 2021-02-10 MED ORDER — LISINOPRIL 20 MG PO TABS
40.0000 mg | ORAL_TABLET | Freq: Every day | ORAL | Status: DC
  Administered 2021-02-11: 40 mg via ORAL
  Filled 2021-02-10: qty 2

## 2021-02-10 MED ORDER — ACETAMINOPHEN 325 MG PO TABS: 650.0000 mg | ORAL_TABLET | Freq: Four times a day (QID) | ORAL | Status: DC | PRN

## 2021-02-10 MED ORDER — AMLODIPINE BESYLATE 5 MG PO TABS
5.0000 mg | ORAL_TABLET | Freq: Every day | ORAL | Status: DC
  Administered 2021-02-11 – 2021-02-12 (×2): 5 mg via ORAL
  Filled 2021-02-10 (×2): qty 1

## 2021-02-10 MED ORDER — ACETAMINOPHEN 650 MG RE SUPP: 650.0000 mg | Freq: Four times a day (QID) | RECTAL | Status: DC | PRN

## 2021-02-10 MED ORDER — IPRATROPIUM-ALBUTEROL 0.5-2.5 (3) MG/3ML IN SOLN
3.0000 mL | Freq: Two times a day (BID) | RESPIRATORY_TRACT | Status: DC
  Administered 2021-02-11 – 2021-02-12 (×3): 3 mL via RESPIRATORY_TRACT
  Filled 2021-02-10 (×3): qty 3

## 2021-02-10 MED ORDER — ASPIRIN 81 MG PO CHEW
81.0000 mg | CHEWABLE_TABLET | Freq: Every day | ORAL | Status: DC
  Administered 2021-02-11 (×2): 81 mg via ORAL
  Filled 2021-02-10 (×2): qty 1

## 2021-02-10 MED ORDER — ALBUTEROL SULFATE (2.5 MG/3ML) 0.083% IN NEBU: 2.5000 mg | INHALATION_SOLUTION | Freq: Four times a day (QID) | RESPIRATORY_TRACT | Status: DC | PRN

## 2021-02-10 MED ORDER — INSULIN ASPART 100 UNIT/ML IJ SOLN
0.0000 [IU] | Freq: Every day | INTRAMUSCULAR | Status: DC
  Administered 2021-02-10 – 2021-02-11 (×2): 2 [IU] via SUBCUTANEOUS

## 2021-02-10 MED ORDER — DOXAZOSIN MESYLATE 2 MG PO TABS
4.0000 mg | ORAL_TABLET | Freq: Every day | ORAL | Status: DC
  Administered 2021-02-11 – 2021-02-12 (×2): 4 mg via ORAL
  Filled 2021-02-10 (×2): qty 2

## 2021-02-10 MED ORDER — LEVOTHYROXINE SODIUM 75 MCG PO TABS
75.0000 ug | ORAL_TABLET | Freq: Every day | ORAL | Status: DC
  Administered 2021-02-11 – 2021-02-12 (×2): 75 ug via ORAL
  Filled 2021-02-10 (×2): qty 1

## 2021-02-10 MED ORDER — SODIUM CHLORIDE 0.9 % IV SOLN: INTRAVENOUS | Status: DC | PRN

## 2021-02-10 MED ORDER — ESCITALOPRAM OXALATE 20 MG PO TABS
20.0000 mg | ORAL_TABLET | Freq: Every day | ORAL | Status: DC
  Administered 2021-02-11 – 2021-02-12 (×2): 20 mg via ORAL
  Filled 2021-02-10 (×2): qty 1

## 2021-02-10 MED ORDER — ALBUTEROL SULFATE HFA 108 (90 BASE) MCG/ACT IN AERS: 2.0000 | INHALATION_SPRAY | Freq: Four times a day (QID) | RESPIRATORY_TRACT | Status: DC | PRN

## 2021-02-10 MED ORDER — ROSUVASTATIN CALCIUM 20 MG PO TABS
20.0000 mg | ORAL_TABLET | Freq: Every day | ORAL | Status: DC
  Administered 2021-02-11 (×2): 20 mg via ORAL
  Filled 2021-02-10 (×2): qty 1

## 2021-02-10 MED ORDER — FUROSEMIDE 10 MG/ML IJ SOLN: 40.0000 mg | Freq: Two times a day (BID) | INTRAMUSCULAR | Status: DC

## 2021-02-10 MED ORDER — KETOCONAZOLE 2 % EX CREA
1.0000 "application " | TOPICAL_CREAM | Freq: Two times a day (BID) | CUTANEOUS | Status: DC
  Administered 2021-02-11 – 2021-02-12 (×3): 1 via TOPICAL
  Filled 2021-02-10: qty 15

## 2021-02-10 MED ORDER — SODIUM CHLORIDE 0.9 % IV SOLN
1.0000 g | Freq: Once | INTRAVENOUS | Status: AC
  Administered 2021-02-10: 1 g via INTRAVENOUS
  Filled 2021-02-10: qty 10

## 2021-02-10 MED ORDER — ENOXAPARIN SODIUM 40 MG/0.4ML IJ SOSY
40.0000 mg | PREFILLED_SYRINGE | INTRAMUSCULAR | Status: DC
  Administered 2021-02-10 – 2021-02-11 (×2): 40 mg via SUBCUTANEOUS
  Filled 2021-02-10 (×2): qty 0.4

## 2021-02-10 MED ORDER — MONTELUKAST SODIUM 10 MG PO TABS
10.0000 mg | ORAL_TABLET | Freq: Every day | ORAL | Status: DC
  Administered 2021-02-11 (×2): 10 mg via ORAL
  Filled 2021-02-10 (×2): qty 1

## 2021-02-10 NOTE — ED Notes (Signed)
X-ray at bedside

## 2021-02-10 NOTE — ED Provider Notes (Signed)
[facility] EMERGENCY DEPT Provider Note   CSN: [id number] Arrival date & time: 02/10/21  1154     History Chief Complaint  Patient presents with   Fever   Cough   Wheezing    Kristi Willis is a 80 y.o. female.  The history is provided by the patient, a relative and medical records.  Fever Associated symptoms: cough   Cough Associated symptoms: fever and wheezing   Wheezing Associated symptoms: cough and fever   Kristi Willis is a 80 y.o. female who presents to the Emergency Department complaining of fever and cough. Level V caveat due to history of aphasia. History is provided by the patient's daughter. Kristi Willis developed wheezing, abdominal discomfort and foot pain on Wednesday. On Thursday she saw her PCP and had a COVID test and chest x-ray performed, both which were negative. She started developing fevers on Thursday. Yesterday she had a fever up to 103.3. She has been using breathing treatments for her wheezing with no significant improvement. She no longer has abdominal pain. Feels unwell. Took Tylenol this morning for fever. Her legs feel very restless.   No current abdominal pain.  No nausea/vomiting.  No HA, CP    Past Medical History:  Diagnosis Date   Allergy    Asthma    Cancer ([place])    skin   Diabetes mellitus    GERD (gastroesophageal reflux disease)    hiatal hernia   Hypercholesteremia    Hypertension    Neuromuscular disorder ([place])    DM neuropathy   Neuropathy    SVT (supraventricular tachycardia) ([place])     Patient Active Problem List   Diagnosis Date Noted   Primary progressive aphasia ([place]) 03/23/2020   Pain in left shoulder 62/26/3335   Acute metabolic encephalopathy 45/62/5638   Anemia 08/05/2019   QT prolongation 08/05/2019   Hypothyroidism 02/12/2019   Speech articulation disorder 09/01/2018   Essential hypertension 10/03/2017   GAD (generalized anxiety disorder) 07/03/2017   Uncontrolled type 2 diabetes  mellitus with diabetic neuropathy, without long-term current use of insulin ([place]) 09/27/2016   Depression 03/29/2016   Insomnia 07/18/2015   Vitamin D deficiency 10/11/2013   Right carotid bruit 03/02/2013   Degenerative disc disease, lumbar 11/27/2012   Asthma 11/13/2010   Hyperlipidemia associated with type 2 diabetes mellitus ([place]) 11/13/2010   Cancer of skin 11/13/2010   Diabetic neuropathy ([place]) 11/13/2010   Wide-complex tachycardia==Atrial Tachycardia 11/13/2010    Past Surgical History:  Procedure Laterality Date   BREAST BIOPSY     left breast biopsy   LUMBAR LAMINECTOMY  08/10   SKIN CANCER EXCISION  2005   SPINE SURGERY     lumbar laminectomy     OB History   No obstetric history on file.     Family History  Problem Relation Age of Onset   Cancer Mother 59       cervical cancer   Depression Mother    Heart disease Father    Stroke Father    Cancer Father        lung cancer   Heart disease Sister 39       MI   Hypertension Sister    Cancer Sister 76       breast cancer   Healthy Daughter     Social History   Tobacco Use   Smoking status: Never   Smokeless tobacco: Never  Vaping Use   Vaping Use: Never used  Substance Use Topics   Alcohol  use: No   Drug use: No    Home Medications Prior to Admission medications   Medication Sig Start Date End Date Taking? Authorizing Provider  Accu-Chek Softclix Lancets lancets CHECK BLOOD SUGAR ONCE DAILY OR AS DIRECTED Dx E11.40 11/23/20   Evelina Dun A, FNP  acetaminophen (TYLENOL) 325 MG tablet Take 2 tablets (650 mg total) by mouth every 6 (six) hours as needed for mild pain or fever (or Fever >/= 101). 08/07/19   Cristal Deer, MD  acyclovir ointment (ZOVIRAX) 5 % APPLY TO THE AFFECTED AREA AROUND MOUTH EVERY 3 HOURS AS DIRECTED UNTIL CLEAR Patient taking differently: Apply 1 application topically every 3 (three) hours. Using as needed for cold sores 05/31/19   Evelina Dun A, FNP  albuterol  (PROVENTIL) (2.5 MG/3ML) 0.083% nebulizer solution Take 3 mLs (2.5 mg total) by nebulization every 6 (six) hours as needed for wheezing or shortness of breath. 02/09/21   Loman Brooklyn, FNP  albuterol (VENTOLIN HFA) 108 (90 Base) MCG/ACT inhaler Inhale 2 puffs into the lungs every 6 (six) hours as needed for wheezing or shortness of breath. 02/08/21   Loman Brooklyn, FNP  amLODipine (NORVASC) 5 MG tablet Take 1 tablet (5 mg total) by mouth daily. 11/14/20   Sharion Balloon, FNP  aspirin 81 MG chewable tablet Chew 81 mg by mouth daily.    [provider]  azelastine (ASTELIN) 0.1 % nasal spray Place 2 sprays into both nostrils 2 (two) times daily. 08/24/14   Lysbeth Penner, FNP  Blood Glucose Monitoring Suppl (ACCU-CHEK AVIVA PLUS) w/Device KIT Check sugars daily & as needed Dx E11.9 08/11/19   Evelina Dun A, FNP  Calcium Carbonate-Vitamin D 600-400 MG-UNIT tablet Take 1 tablet by mouth 2 (two) times daily.    [provider]  doxazosin (CARDURA) 8 MG tablet Take 1 tablet (8 mg total) by mouth daily. (Needs to be seen before next refill) 08/29/20   Sharion Balloon, FNP  EPIPEN 2-PAK 0.3 MG/0.3ML SOAJ injection Inject 0.3 mg into the muscle as needed for anaphylaxis. Reported on 01/24/2016 03/07/14   [provider]  escitalopram (LEXAPRO) 20 MG tablet TAKE 1 TABLET DAILY 11/20/20   Evelina Dun A, FNP  furosemide (LASIX) 20 MG tablet Take 1 tablet (20 mg total) by mouth daily as needed for fluid (Weight gain of 3 lbs in a day or 5 lbs in a week). 08/02/19 11/01/19  Donato Heinz, MD  gabapentin (NEURONTIN) 100 MG capsule Take 1 capsule (100 mg total) 3 (three) times daily by mouth. 07/03/17   Evelina Dun A, FNP  glucose blood (ACCU-CHEK AVIVA PLUS) test strip CHECK BLOOD SUGAR ONCE DAILY OR AS DIRECTED Dx E11.40 11/23/20   Evelina Dun A, FNP  ipratropium-albuterol (DUONEB) 0.5-2.5 (3) MG/3ML SOLN Take 3 mLs by nebulization 2 (two) times daily. 08/07/19   Cristal Deer, MD  ketoconazole (NIZORAL) 2 % cream APPLY TO AFFECTED AREASFON BOTTOMS OF FEET TWICE A DAY 01/11/20   [provider]  levothyroxine (SYNTHROID) 75 MCG tablet Take 1 tablet (75 mcg total) by mouth daily before breakfast. 05/09/20   Brita Romp, NP  lisinopril (ZESTRIL) 40 MG tablet TAKE 1 TABLET DAILY 10/26/20   Evelina Dun A, FNP  loratadine (CLARITIN) 10 MG tablet Take 1 tablet (10 mg total) by mouth daily. 08/24/14   Lysbeth Penner, FNP  metFORMIN (GLUCOPHAGE) 1000 MG tablet TAKE  (1)  TABLET TWICE A DAY WITH MEALS (BREAKFAST AND SUPPER) 05/09/20  Brita Romp, NP  metoprolol succinate (TOPROL-XL) 100 MG 24 hr tablet Take 1 tablet (100 mg total) by mouth daily. 11/20/20   Evelina Dun A, FNP  montelukast (SINGULAIR) 10 MG tablet Take 1 tablet (10 mg total) by mouth at bedtime. 04/14/15   Claretta Fraise, MD  Multiple Vitamin (MULTIVITAMIN) tablet Take 1 tablet by mouth daily.    [provider]  naproxen (NAPROSYN) 500 MG tablet Take 1 tablet (500 mg total) by mouth 2 (two) times daily with a meal. 10/25/19   Hawks, Alyse Low A, FNP  rosuvastatin (CRESTOR) 20 MG tablet Take 1 tablet (20 mg total) by mouth at bedtime. 05/09/20 05/09/21  Brita Romp, NP  vitamin B-12 (CYANOCOBALAMIN) 500 MCG tablet Take 500 mcg by mouth daily.    [provider]  zinc gluconate 50 MG tablet Take 50 mg by mouth daily.    [provider]    Allergies    Dimetapp c [phenylephrine-bromphen-codeine], Phenylephrine hcl, Ultram [tramadol hcl], Celebrex [celecoxib], Cortisone, Fenofibrate, and Sulfa antibiotics  Review of Systems   Review of Systems  Constitutional:  Positive for fever.  Respiratory:  Positive for cough and wheezing.   All other systems reviewed and are negative.  Physical Exam Updated Vital Signs BP (!) 130/56 (BP Location: Right Arm)   Pulse 63   Temp 99 F (37.2 C)   Resp 20   Ht '5\' 4"'  (1.626 m)   Wt 74.4 kg   SpO2 97%   BMI  28.15 kg/m   Physical Exam Vitals and nursing note reviewed.  Constitutional:      Appearance: She is well-developed.  HENT:     Head: Normocephalic and atraumatic.  Cardiovascular:     Rate and Rhythm: Normal rate and regular rhythm.     Heart sounds: No murmur heard. Pulmonary:     Effort: Pulmonary effort is normal. No respiratory distress.     Comments: scattered crackles in the bases bilaterally as well is in the left upper lung fields. Abdominal:     Palpations: Abdomen is soft.     Tenderness: There is no abdominal tenderness. There is no guarding or rebound.  Musculoskeletal:        General: No tenderness.  Skin:    General: Skin is warm and dry.  Neurological:     Mental Status: She is alert and oriented to person, place, and time.  Psychiatric:        Behavior: Behavior normal.    ED Results / Procedures / Treatments   Labs (all labs ordered are listed, but only abnormal results are displayed) Labs Reviewed  COMPREHENSIVE METABOLIC PANEL - Abnormal; Notable for the following components:      Result Value   Sodium 123 (*)    Chloride 87 (*)    Glucose, Bld 222 (*)    Calcium 8.6 (*)    Total Protein 6.2 (*)    Total Bilirubin 1.3 (*)    All other components within normal limits  CBC WITH DIFFERENTIAL/PLATELET - Abnormal; Notable for the following components:   WBC 10.8 (*)    RBC 3.15 (*)    Hemoglobin 8.7 (*)    HCT 25.4 (*)    Neutro Abs 8.4 (*)    Monocytes Absolute 1.1 (*)    All other components within normal limits  URINALYSIS, ROUTINE W REFLEX MICROSCOPIC - Abnormal; Notable for the following components:   Protein, ur 30 (*)    Leukocytes,Ua SMALL (*)    Bacteria,  UA MANY (*)    Non Squamous Epithelial 0-5 (*)    All other components within normal limits  BRAIN NATRIURETIC PEPTIDE - Abnormal; Notable for the following components:   B Natriuretic Peptide 182.3 (*)    All other components within normal limits  TROPONIN I (HIGH SENSITIVITY) -  Abnormal; Notable for the following components:   Troponin I (High Sensitivity) 23 (*)    All other components within normal limits  RESP PANEL BY RT-PCR (FLU A&B, COVID) [id number]    EKG EKG Interpretation  Date/Time:  Saturday February 10 2021 14:40:23 EDT Ventricular Rate:  65 PR Interval:  168 QRS Duration: 116 QT Interval:  513 QTC Calculation: 534 R Axis:   109 Text Interpretation: Sinus rhythm Nonspecific intraventricular conduction delay Confirmed by Quintella Reichert [phone]) on 02/10/2021 2:50:51 PM  Radiology DG Chest 2 View  Result Date: 02/10/2021 CLINICAL DATA:  Fever, cough EXAM: CHEST - 2 VIEW COMPARISON:  02/08/2021 FINDINGS: Cardiomegaly. Mild, diffuse interstitial pulmonary opacity and trace bilateral pleural effusions. The visualized skeletal structures are unremarkable. IMPRESSION: Cardiomegaly with mild, diffuse interstitial pulmonary opacity and trace bilateral pleural effusions, findings consistent with edema. No focal airspace opacity. Electronically Signed   By: Eddie Candle M.D.   On: 02/10/2021 14:23    Procedures Procedures   Medications Ordered in ED Medications  cefTRIAXone (ROCEPHIN) 1 g in sodium chloride 0.9 % 100 mL IVPB (has no administration in time range)  0.9 %  sodium chloride infusion (has no administration in time range)  iohexol (OMNIPAQUE) 350 MG/ML injection 75 mL (75 mLs Intravenous Contrast Given 02/10/21 1502)    ED Course  I have reviewed the triage vital signs and the nursing notes.  Pertinent labs & imaging results that were available during my care of the patient were reviewed by me and considered in my medical decision making (see chart for details).    MDM Rules/Calculators/A&P                         Patient here for evaluation of fever, difficulty breathing. She is non-toxic appearing on evaluation with no respiratory distress. She does have crackles on lung exam. Chest x-ray with pulmonary vascular congestion. Given reports of  fevers, abnormal lung findings will obtain CTA to rule out PE resulting in pulmonary edema. Labs with hyponatremia, unclear source. She is you bulimic on evaluation. Patient care transferred pending CTA and cardiac markers.  Final Clinical Impression(s) / ED Diagnoses Final diagnoses:  None    Rx / DC Orders ED Discharge Orders     None        Quintella Reichert, MD 02/10/21 1529

## 2021-02-10 NOTE — ED Triage Notes (Signed)
Seen by PCP Wednesday, due to "kinda out of it" disoriented, Neg Pneumonia and Covid. History of asthma and wheezing. Fever yesterday 103.3. This morning fever, took Tylenol. Productive cough with green secretions.

## 2021-02-10 NOTE — H&P (Signed)
History and Physical   Kristi Willis AJO:[id number] DOB: Jul 28, 1941 DOA: 02/10/2021  Referring MD/NP/PA: Dr. Alvino Chapel  PCP: Sharion Balloon, FNP   Outpatient Specialists: None  Patient coming from: [facility] emergency room  Chief Complaint: Fever and cough  HPI: Kristi Willis is a 80 y.o. female with medical history significant of diabetes, essential hypertension, history of SVTs, hyperlipidemia, diabetic neuropathy, GERD, skin cancer, asthma, dysphasia who presented to the ER with fever, cough and wheezing.  Patient's relatives at bedside.  She has the aphasia and not able to give adequate history mainly expressive aphasia.  She had some wheezing abdominal discomfort and foot pain on Wednesday.  She was seen by her PCP on Thursday with COVID test and x-ray done both were negative.  On Thursday she had some fever up to 103.  Patient has been using breathing treatments with no improvement.  She has taken Norco over-the-counter medications including Tylenol not improving.  Came to the ER where she was evaluated again.  No evidence of pneumonia on x-ray but patient has new pulmonary edema.  Suspected CHF.  Also noted to have evidence of UTI.  Sodium at 123 with no obvious cause.  She has been admitted to the hospital for further evaluation and treatment..  ED Course: Temperature 99.9 blood pressure 161/62, pulse 99, respiratory 28 and oxygen sat 84% on room air.  Sodium 123, potassium 3.9 chloride 87 CO2 25, BUN of 11 and creatinine 0.67.  Glucose 222.  Calcium 8.6.  White count 10.8 hemoglobin 8.7 platelets 155.  BNP of 182.  COVID-19 and influenza screen is negative.  Troponin of 20 glucose 244.  CT angiogram of the chest showed no PE but mild congestive heart failure.  There is mediastinal lymphadenopathy which is reactive.  Urinalysis showed yellow urine with many bacteria WBC 21-50.  Patient being admitted for further evaluation and treatment with UTI as well as new CHF.  Review of  Systems: As per HPI otherwise 10 point review of systems negative.    Past Medical History:  Diagnosis Date   Allergy    Asthma    Cancer ([place])    skin   Diabetes mellitus    GERD (gastroesophageal reflux disease)    hiatal hernia   Hypercholesteremia    Hypertension    Neuromuscular disorder (HCC)    DM neuropathy   Neuropathy    SVT (supraventricular tachycardia) ([place])     Past Surgical History:  Procedure Laterality Date   BREAST BIOPSY     left breast biopsy   LUMBAR LAMINECTOMY  08/10   SKIN CANCER EXCISION  2005   SPINE SURGERY     lumbar laminectomy     reports that she has never smoked. She has never used smokeless tobacco. She reports that she does not drink alcohol and does not use drugs.  Allergies  Allergen Reactions   Dimetapp C [Phenylephrine-Bromphen-Codeine] Itching   Phenylephrine Hcl Itching   Ultram [Tramadol Hcl] Other (See Comments)    Insomnia   Celebrex [Celecoxib] Rash   Cortisone Rash   Fenofibrate Rash   Sulfa Antibiotics Rash    Family History  Problem Relation Age of Onset   Cancer Mother 35       cervical cancer   Depression Mother    Heart disease Father    Stroke Father    Cancer Father        lung cancer   Heart disease Sister 51       MI  Hypertension Sister    Cancer Sister 50       breast cancer   Healthy Daughter      Prior to Admission medications   Medication Sig Start Date End Date Taking? Authorizing Provider  Accu-Chek Softclix Lancets lancets CHECK BLOOD SUGAR ONCE DAILY OR AS DIRECTED Dx E11.40 11/23/20   Evelina Dun A, FNP  acetaminophen (TYLENOL) 325 MG tablet Take 2 tablets (650 mg total) by mouth every 6 (six) hours as needed for mild pain or fever (or Fever >/= 101). 08/07/19   Cristal Deer, MD  acyclovir ointment (ZOVIRAX) 5 % APPLY TO THE AFFECTED AREA AROUND MOUTH EVERY 3 HOURS AS DIRECTED UNTIL CLEAR Patient taking differently: Apply 1 application topically every 3 (three) hours. Using as  needed for cold sores 05/31/19   Evelina Dun A, FNP  albuterol (PROVENTIL) (2.5 MG/3ML) 0.083% nebulizer solution Take 3 mLs (2.5 mg total) by nebulization every 6 (six) hours as needed for wheezing or shortness of breath. 02/09/21   Loman Brooklyn, FNP  albuterol (VENTOLIN HFA) 108 (90 Base) MCG/ACT inhaler Inhale 2 puffs into the lungs every 6 (six) hours as needed for wheezing or shortness of breath. 02/08/21   Loman Brooklyn, FNP  amLODipine (NORVASC) 5 MG tablet Take 1 tablet (5 mg total) by mouth daily. 11/14/20   Sharion Balloon, FNP  aspirin 81 MG chewable tablet Chew 81 mg by mouth daily.    [provider]  azelastine (ASTELIN) 0.1 % nasal spray Place 2 sprays into both nostrils 2 (two) times daily. 08/24/14   Lysbeth Penner, FNP  Blood Glucose Monitoring Suppl (ACCU-CHEK AVIVA PLUS) w/Device KIT Check sugars daily & as needed Dx E11.9 08/11/19   Evelina Dun A, FNP  Calcium Carbonate-Vitamin D 600-400 MG-UNIT tablet Take 1 tablet by mouth 2 (two) times daily.    [provider]  doxazosin (CARDURA) 8 MG tablet Take 1 tablet (8 mg total) by mouth daily. (Needs to be seen before next refill) 08/29/20   Sharion Balloon, FNP  EPIPEN 2-PAK 0.3 MG/0.3ML SOAJ injection Inject 0.3 mg into the muscle as needed for anaphylaxis. Reported on 01/24/2016 03/07/14   [provider]  escitalopram (LEXAPRO) 20 MG tablet TAKE 1 TABLET DAILY Patient taking differently: Take 20 mg by mouth daily. 11/20/20   Sharion Balloon, FNP  furosemide (LASIX) 20 MG tablet Take 1 tablet (20 mg total) by mouth daily as needed for fluid (Weight gain of 3 lbs in a day or 5 lbs in a week). 08/02/19 11/01/19  Donato Heinz, MD  gabapentin (NEURONTIN) 100 MG capsule Take 1 capsule (100 mg total) 3 (three) times daily by mouth. 07/03/17   Evelina Dun A, FNP  glucose blood (ACCU-CHEK AVIVA PLUS) test strip CHECK BLOOD SUGAR ONCE DAILY OR AS DIRECTED Dx E11.40 11/23/20   Evelina Dun A, FNP   ipratropium-albuterol (DUONEB) 0.5-2.5 (3) MG/3ML SOLN Take 3 mLs by nebulization 2 (two) times daily. 08/07/19   Cristal Deer, MD  ketoconazole (NIZORAL) 2 % cream Apply 1 application topically See admin instructions. Apply topically to bottoms of feet twice daily 01/11/20   [provider]  levothyroxine (SYNTHROID) 75 MCG tablet Take 1 tablet (75 mcg total) by mouth daily before breakfast. 05/09/20   Brita Romp, NP  lisinopril (ZESTRIL) 40 MG tablet TAKE 1 TABLET DAILY Patient taking differently: Take 40 mg by mouth daily. 10/26/20   Sharion Balloon, FNP  loratadine (CLARITIN) 10 MG tablet Take  1 tablet (10 mg total) by mouth daily. 08/24/14   Lysbeth Penner, FNP  metFORMIN (GLUCOPHAGE) 1000 MG tablet TAKE  (1)  TABLET TWICE A DAY WITH MEALS (BREAKFAST AND SUPPER) Patient taking differently: Take 1,000 mg by mouth 2 (two) times daily with a meal. 05/09/20   Brita Romp, NP  metoprolol succinate (TOPROL-XL) 100 MG 24 hr tablet Take 1 tablet (100 mg total) by mouth daily. 11/20/20   Evelina Dun A, FNP  montelukast (SINGULAIR) 10 MG tablet Take 1 tablet (10 mg total) by mouth at bedtime. 04/14/15   Claretta Fraise, MD  Multiple Vitamin (MULTIVITAMIN) tablet Take 1 tablet by mouth daily.    [provider]  naproxen (NAPROSYN) 500 MG tablet Take 1 tablet (500 mg total) by mouth 2 (two) times daily with a meal. 10/25/19   Hawks, Alyse Low A, FNP  rosuvastatin (CRESTOR) 20 MG tablet Take 1 tablet (20 mg total) by mouth at bedtime. 05/09/20 05/09/21  Brita Romp, NP  vitamin B-12 (CYANOCOBALAMIN) 500 MCG tablet Take 500 mcg by mouth daily.    [provider]  zinc gluconate 50 MG tablet Take 50 mg by mouth daily.    [provider]    Physical Exam: Vitals:   02/10/21 1815 02/10/21 2038 02/10/21 2040 02/10/21 2138  BP:  (!) 161/62    Pulse: 81 79    Resp: (!) 24 20    Temp:  99.9 F (37.7 C)    TempSrc:    Oral  SpO2: 95% 94%    Weight:    75 kg   Height:    '5\' 4"'  (1.626 m)      Constitutional: Aphasic, acutely ill looking, no distress Vitals:   02/10/21 1815 02/10/21 2038 02/10/21 2040 02/10/21 2138  BP:  (!) 161/62    Pulse: 81 79    Resp: (!) 24 20    Temp:  99.9 F (37.7 C)    TempSrc:    Oral  SpO2: 95% 94%    Weight:   75 kg   Height:    '5\' 4"'  (1.626 m)   Eyes: PERRL, lids and conjunctivae normal ENMT: Mucous membranes are dry. Posterior pharynx clear of any exudate or lesions.Normal dentition.  Neck: normal, supple, no masses, no thyromegaly Respiratory: clear to auscultation bilaterally, no wheezing, no crackles. Normal respiratory effort. No accessory muscle use.  Cardiovascular: Regular rate and rhythm, no murmurs / rubs / gallops. No extremity edema. 2+ pedal pulses. No carotid bruits.  Abdomen: no tenderness, no masses palpated. No hepatosplenomegaly. Bowel sounds positive.  Musculoskeletal: no clubbing / cyanosis. No joint deformity upper and lower extremities. Good ROM, no contractures. Normal muscle tone.  Skin: no rashes, lesions, ulcers. No induration Neurologic: CN 2-12 grossly intact. Sensation intact, DTR normal. Strength 5/5 in all 4.  Patient is aphasic but making gestures Psychiatric: Normal judgment and insight. Alert and oriented x 3.  Anxious mood.     Labs on Admission: I have personally reviewed following labs and imaging studies  CBC: Recent Labs  Lab 02/10/21 1332  WBC 10.8*  NEUTROABS 8.4*  HGB 8.7*  HCT 25.4*  MCV 80.6  PLT 628   Basic Metabolic Panel: Recent Labs  Lab 02/10/21 1332  NA 123*  K 3.9  CL 87*  CO2 25  GLUCOSE 222*  BUN 11  CREATININE 0.67  CALCIUM 8.6*   GFR: Estimated Creatinine Clearance: 56.5 mL/min (by C-G formula based on SCr of 0.67 mg/dL). Liver Function Tests:  Recent Labs  Lab 02/10/21 1332  AST 33  ALT 30  ALKPHOS 63  BILITOT 1.3*  PROT 6.2*  ALBUMIN 3.9   No results for input(s): LIPASE, AMYLASE in the last 168 hours. No  results for input(s): AMMONIA in the last 168 hours. Coagulation Profile: No results for input(s): INR, PROTIME in the last 168 hours. Cardiac Enzymes: No results for input(s): CKTOTAL, CKMB, CKMBINDEX, TROPONINI in the last 168 hours. BNP (last 3 results) No results for input(s): PROBNP in the last 8760 hours. HbA1C: No results for input(s): HGBA1C in the last 72 hours. CBG: Recent Labs  Lab 02/10/21 2104  GLUCAP 244*   Lipid Profile: No results for input(s): CHOL, HDL, LDLCALC, TRIG, CHOLHDL, LDLDIRECT in the last 72 hours. Thyroid Function Tests: No results for input(s): TSH, T4TOTAL, FREET4, T3FREE, THYROIDAB in the last 72 hours. Anemia Panel: No results for input(s): VITAMINB12, FOLATE, FERRITIN, TIBC, IRON, RETICCTPCT in the last 72 hours. Urine analysis:    Component Value Date/Time   COLORURINE YELLOW 02/10/2021 1331   APPEARANCEUR CLEAR 02/10/2021 1331   APPEARANCEUR Hazy (A) 02/08/2021 1033   LABSPEC 1.009 02/10/2021 1331   PHURINE 5.5 02/10/2021 1331   GLUCOSEU NEGATIVE 02/10/2021 1331   HGBUR NEGATIVE 02/10/2021 1331   BILIRUBINUR NEGATIVE 02/10/2021 1331   BILIRUBINUR Negative 02/08/2021 1033   [place] 02/10/2021 1331   PROTEINUR 30 (A) 02/10/2021 1331   UROBILINOGEN 0.2 02/14/2012 0836   NITRITE NEGATIVE 02/10/2021 1331   LEUKOCYTESUR SMALL (A) 02/10/2021 1331   Sepsis Labs: '@LABRCNTIP' (procalcitonin:4,lacticidven:4) ) Recent Results (from the past 240 hour(s))  Microscopic Examination     Status: Abnormal   Collection Time: 02/08/21 10:33 AM   Urine  Result Value Ref Range Status   WBC, UA 0-5 0 - 5 /hpf Final   RBC 0-2 0 - 2 /hpf Final   Epithelial Cells (non renal) 0-10 0 - 10 /hpf Final   Renal Epithel, UA 0-10 (A) None seen /hpf Final   Casts Present (A) None seen /lpf Final   Cast Type Granular casts (A) N/A Final   Bacteria, UA Few None seen/Few Final  Urine Culture     Status: None   Collection Time: 02/08/21 11:04 AM    Specimen: Urine   UR  Result Value Ref Range Status   Urine Culture, Routine Final report  Final   Organism ID, Bacteria No growth  Final  Novel Coronavirus, NAA ([facility])     Status: None   Collection Time: 02/08/21 11:05 AM   Specimen: Nasopharyngeal(NP) swabs in vial transport medium  Result Value Ref Range Status   SARS-CoV-2, NAA Not Detected Not Detected Final    Comment: This nucleic acid amplification test was developed and its performance characteristics determined by [employer]. Nucleic acid amplification tests include RT-PCR and TMA. This test has not been FDA cleared or approved. This test has been authorized by FDA under an Emergency Use Authorization (EUA). This test is only authorized for the duration of time the declaration that circumstances exist justifying the authorization of the emergency use of in vitro diagnostic tests for detection of SARS-CoV-2 virus and/or diagnosis of COVID-19 infection under section 564(b)(1) of the Act, 21 U.S.C. 595GLO-7(F) (1), unless the authorization is terminated or revoked sooner. When diagnostic testing is negative, the possibility of a false negative result should be considered in the context of a patient's recent exposures and the presence of clinical signs and symptoms consistent with COVID-19. An individual without symptoms of COVID-19 and  who is not shedding SARS-CoV-2 virus wo uld expect to have a negative (not detected) result in this assay.   SARS-COV-2, NAA 2 DAY TAT     Status: None   Collection Time: 02/08/21 11:05 AM  Result Value Ref Range Status   SARS-CoV-2, NAA 2 DAY TAT Performed  Final  Resp Panel by RT-PCR (Flu A&B, Covid) Nasopharyngeal Swab     Status: None   Collection Time: 02/10/21  3:37 PM   Specimen: Nasopharyngeal Swab; Nasopharyngeal(NP) swabs in vial transport medium  Result Value Ref Range Status   SARS Coronavirus 2 by RT PCR NEGATIVE NEGATIVE Final    Comment: (NOTE) SARS-CoV-2 target  nucleic acids are NOT DETECTED.  The SARS-CoV-2 RNA is generally detectable in upper respiratory specimens during the acute phase of infection. The lowest concentration of SARS-CoV-2 viral copies this assay can detect is 138 copies/mL. A negative result does not preclude SARS-Cov-2 infection and should not be used as the sole basis for treatment or other patient management decisions. A negative result may occur with  improper specimen collection/handling, submission of specimen other than nasopharyngeal swab, presence of viral mutation(s) within the areas targeted by this assay, and inadequate number of viral copies(<138 copies/mL). A negative result must be combined with clinical observations, patient history, and epidemiological information. The expected result is Negative.  Fact Sheet for Patients:  EntrepreneurPulse.com.au  Fact Sheet for Healthcare Providers:  IncredibleEmployment.be  This test is no t yet approved or cleared by the [place] FDA and  has been authorized for detection and/or diagnosis of SARS-CoV-2 by FDA under an Emergency Use Authorization (EUA). This EUA will remain  in effect (meaning this test can be used) for the duration of the COVID-19 declaration under Section 564(b)(1) of the Act, 21 U.S.C.section 360bbb-3(b)(1), unless the authorization is terminated  or revoked sooner.       Influenza A by PCR NEGATIVE NEGATIVE Final   Influenza B by PCR NEGATIVE NEGATIVE Final    Comment: (NOTE) The Xpert Xpress SARS-CoV-2/FLU/RSV plus assay is intended as an aid in the diagnosis of influenza from Nasopharyngeal swab specimens and should not be used as a sole basis for treatment. Nasal washings and aspirates are unacceptable for Xpert Xpress SARS-CoV-2/FLU/RSV testing.  Fact Sheet for Patients: EntrepreneurPulse.com.au  Fact Sheet for Healthcare  Providers: IncredibleEmployment.be  This test is not yet approved or cleared by the [place] FDA and has been authorized for detection and/or diagnosis of SARS-CoV-2 by FDA under an Emergency Use Authorization (EUA). This EUA will remain in effect (meaning this test can be used) for the duration of the COVID-19 declaration under Section 564(b)(1) of the Act, 21 U.S.C. section 360bbb-3(b)(1), unless the authorization is terminated or revoked.  Performed at [facility], [address], [place], [place] [address]      Radiological Exams on Admission: DG Chest 2 View  Result Date: 02/10/2021 CLINICAL DATA:  Fever, cough EXAM: CHEST - 2 VIEW COMPARISON:  02/08/2021 FINDINGS: Cardiomegaly. Mild, diffuse interstitial pulmonary opacity and trace bilateral pleural effusions. The visualized skeletal structures are unremarkable. IMPRESSION: Cardiomegaly with mild, diffuse interstitial pulmonary opacity and trace bilateral pleural effusions, findings consistent with edema. No focal airspace opacity. Electronically Signed   By: Eddie Candle M.D.   On: 02/10/2021 14:23   CT Angio Chest PE W/Cm &/Or Wo Cm  Result Date: 02/10/2021 CLINICAL DATA:  Fever and productive cough. EXAM: CT ANGIOGRAPHY CHEST WITH CONTRAST TECHNIQUE: Multidetector CT imaging of the chest was performed using  the standard protocol during bolus administration of intravenous contrast. Multiplanar CT image reconstructions and MIPs were obtained to evaluate the vascular anatomy. CONTRAST:  53m OMNIPAQUE IOHEXOL 350 MG/ML SOLN COMPARISON:  Chest x-ray from same day. CTA chest dated August 04, 2019. FINDINGS: Cardiovascular: Satisfactory opacification of the pulmonary arteries to the segmental level. No evidence of pulmonary embolism. Unchanged mild cardiomegaly. Trace pericardial effusion. No thoracic aortic aneurysm or dissection. Coronary, aortic arch, and branch vessel atherosclerotic  vascular disease. Mediastinum/Nodes: Multiple enlarged mediastinal lymph nodes measuring up to 1.4 cm in short axis, unchanged. The thyroid gland, trachea, and esophagus demonstrate no significant findings. Lungs/Pleura: Relatively unchanged small right greater than left pleural effusions. Scattered mild smooth interlobular septal thickening. No consolidation or pneumothorax. Upper Abdomen: No acute abnormality. Musculoskeletal: No chest wall abnormality. No acute or significant osseous findings. Review of the MIP images confirms the above findings. IMPRESSION: 1. No evidence of pulmonary embolism. 2. Mild congestive heart failure. 3. Unchanged mediastinal lymphadenopathy, likely reactive. 4. Aortic Atherosclerosis (ICD10-I70.0). Electronically Signed   By: WTitus DubinM.D.   On: 02/10/2021 15:26    EKG: Independently reviewed. Which shows sinus rhythm with mild ST depression in the lateral leads no significant other changes.  Assessment/Plan Principal Problem:   Hyponatremia Active Problems:   Asthma   Hyperlipidemia associated with type 2 diabetes mellitus (HCC)   Diabetic neuropathy ([place]   Essential hypertension   Speech articulation disorder   Hypothyroidism     #1 hyponatremia: Probably due to fluid overload.  Could also be secondary to intravascular contraction.  Patient being treated for possible CHF as x-ray showed fluid overload and pulmonary edema.  We will check urine osmolality and sodium.  Check serum osmolarity.  Several differentials are feasible including SIADH.  In the meantime I will hold off on IV fluids since patient is fluid overloaded.  We will address it after results of urine and blood osmolality.  #2 diabetes type 2: Sliding scale insulin.  #3 asthma: No exacerbation.  On duo nebs as well as inhalers.  Continue continue to monitor  #4 pulmonary congestion: No prior documented history of CHF.  Last echocardiogram in December 2020 was normal.  Repeat  echocardiogram.  #5 essential hypertension: On metoprolol, amlodipine and lisinopril.  Continue home regimen.  #6 hyperlipidemia: Continue with Crestor  #7 speech articulation disorder: Patient understand gestures and is making gestures.  #8 depression with anxiety: Continue with Lexapro     DVT prophylaxis: Lovenox Code Status: Full code Family Communication: Relative in the room with the patient Disposition Plan: Home Consults called: None Admission status: Inpatient  Severity of Illness: The appropriate patient status for this patient is INPATIENT. Inpatient status is judged to be reasonable and necessary in order to provide the required intensity of service to ensure the patient's safety. The patient's presenting symptoms, physical exam findings, and initial radiographic and laboratory data in the context of their chronic comorbidities is felt to place them at high risk for further clinical deterioration. Furthermore, it is not anticipated that the patient will be medically stable for discharge from the hospital within 2 midnights of admission. The following factors support the patient status of inpatient.   " The patient's presenting symptoms include fever and cough. " The worrisome physical exam findings include coarse breath sounds. " The initial radiographic and laboratory data are worrisome because of CHF. " The chronic co-morbidities include diabetes with hypertension.   * I certify that at the point of admission it is my  clinical judgment that the patient will require inpatient hospital care spanning beyond 2 midnights from the point of admission due to high intensity of service, high risk for further deterioration and high frequency of surveillance required.Barbette Merino MD [facility] Pager [phone]  If 7PM-7AM, please contact night-coverage [email or web address] Password [facility]  02/10/2021, 11:19 PM

## 2021-02-10 NOTE — ED Notes (Signed)
Handoff report given to [facility] 

## 2021-02-10 NOTE — ED Notes (Signed)
Care handoff report given to RN [facility]

## 2021-02-10 NOTE — ED Notes (Signed)
[facility] at bedside 

## 2021-02-10 NOTE — ED Provider Notes (Signed)
  Physical Exam  BP (!) 156/63   Pulse 65   Temp 99 F (37.2 C)   Resp (!) 28   Ht 5\' 4"  (1.626 m)   Wt 74.4 kg   SpO2 (!) 84%   BMI 28.15 kg/m   Physical Exam  ED Course/Procedures     Procedures  MDM  Had fever and coughing.  Some swelling on her legs.  Negative COVID test.  X-ray showsVolume overload.  CT scan done and also showed potential volume overload.  History of peripheral edema no apparent history of CHF.  Has aphasia which makes history difficult.  Also has apparent UTI.  Sodium down also to 123.  With hyponatremia worsening edema and UTI will admit to hospitalist.      Davonna Belling, MD 02/10/21 1739

## 2021-02-11 ENCOUNTER — Inpatient Hospital Stay (HOSPITAL_COMMUNITY): Payer: Medicare PPO

## 2021-02-11 DIAGNOSIS — I5033 Acute on chronic diastolic (congestive) heart failure: Secondary | ICD-10-CM

## 2021-02-11 DIAGNOSIS — R0989 Other specified symptoms and signs involving the circulatory and respiratory systems: Secondary | ICD-10-CM | POA: Insufficient documentation

## 2021-02-11 DIAGNOSIS — E1140 Type 2 diabetes mellitus with diabetic neuropathy, unspecified: Secondary | ICD-10-CM

## 2021-02-11 DIAGNOSIS — G3101 Pick's disease: Secondary | ICD-10-CM

## 2021-02-11 DIAGNOSIS — F0280 Dementia in other diseases classified elsewhere without behavioral disturbance: Secondary | ICD-10-CM

## 2021-02-11 DIAGNOSIS — E1165 Type 2 diabetes mellitus with hyperglycemia: Secondary | ICD-10-CM

## 2021-02-11 DIAGNOSIS — R509 Fever, unspecified: Secondary | ICD-10-CM

## 2021-02-11 DIAGNOSIS — I700 Atherosclerosis of aorta: Secondary | ICD-10-CM

## 2021-02-11 DIAGNOSIS — E039 Hypothyroidism, unspecified: Secondary | ICD-10-CM

## 2021-02-11 LAB — ECHOCARDIOGRAM COMPLETE
Area-P 1/2: 3.12 cm2
Height: 64 in
S' Lateral: 2.8 cm
Weight: 2641.99 oz

## 2021-02-11 LAB — COMPREHENSIVE METABOLIC PANEL
ALT: 30 U/L (ref 0–44)
AST: 26 U/L (ref 15–41)
Albumin: 3.7 g/dL (ref 3.5–5.0)
Alkaline Phosphatase: 65 U/L (ref 38–126)
Anion gap: 12 (ref 5–15)
BUN: 9 mg/dL (ref 8–23)
CO2: 29 mmol/L (ref 22–32)
Calcium: 8.7 mg/dL — ABNORMAL LOW (ref 8.9–10.3)
Chloride: 91 mmol/L — ABNORMAL LOW (ref 98–111)
Creatinine, Ser: 0.62 mg/dL (ref 0.44–1.00)
GFR, Estimated: >60 mL/min (ref >60)
Glucose, Bld: 165 mg/dL — ABNORMAL HIGH (ref 70–99)
Potassium: 3.3 mmol/L — ABNORMAL LOW (ref 3.5–5.1)
Sodium: 132 mmol/L — ABNORMAL LOW (ref 135–145)
Total Bilirubin: 1.2 mg/dL (ref 0.3–1.2)
Total Protein: 7 g/dL (ref 6.5–8.1)

## 2021-02-11 LAB — OSMOLALITY, URINE: Osmolality, Ur: 149 mOsm/kg — ABNORMAL LOW (ref 300–900)

## 2021-02-11 LAB — CBC
HCT: 25.5 % — ABNORMAL LOW (ref 36.0–46.0)
Hemoglobin: 8.6 g/dL — ABNORMAL LOW (ref 12.0–15.0)
MCH: 27.5 pg (ref 26.0–34.0)
MCHC: 33.7 g/dL (ref 30.0–36.0)
MCV: 81.5 fL (ref 80.0–100.0)
Platelets: 189 10*3/uL (ref 150–400)
RBC: 3.13 MIL/uL — ABNORMAL LOW (ref 3.87–5.11)
RDW: 14.1 % (ref 11.5–15.5)
WBC: 10.8 10*3/uL — ABNORMAL HIGH (ref 4.0–10.5)
nRBC: 0 % (ref 0.0–0.2)

## 2021-02-11 LAB — OSMOLALITY: Osmolality: 273 mOsm/kg — ABNORMAL LOW (ref 275–295)

## 2021-02-11 LAB — SODIUM, URINE, RANDOM: Sodium, Ur: <10 mmol/L

## 2021-02-11 LAB — GLUCOSE, CAPILLARY
Glucose-Capillary: 183 mg/dL — ABNORMAL HIGH (ref 70–99)
Glucose-Capillary: 304 mg/dL — ABNORMAL HIGH (ref 70–99)
Glucose-Capillary: 193 mg/dL — ABNORMAL HIGH (ref 70–99)
Glucose-Capillary: 238 mg/dL — ABNORMAL HIGH (ref 70–99)

## 2021-02-11 NOTE — Assessment & Plan Note (Addendum)
--   Appears stable at this time.  No wheezing on exam.  She does have some crackles at bases however she has no evidence of volume overload or CHF and echocardiogram is reassuring.  I would favor some form of interstitial lung disease. -- Follow-up as an outpatient.  Continue bronchodilators.

## 2021-02-11 NOTE — Assessment & Plan Note (Addendum)
--   On Lexapro but doubt this is a causative agent.  Hypochloremia combined with history of spending significant time outside in the heat gardening more suggestive of excessive solute loss not replaced.  --Curiously sodium has been corrected with 1 dose of Lasix, no fluids or other interventions. -- Slightly improved, patient at baseline, expect spontaneous recovery.  No further evaluation suggested.

## 2021-02-11 NOTE — Assessment & Plan Note (Addendum)
stable °

## 2021-02-11 NOTE — Hospital Course (Addendum)
80 year old woman presented with several day history of wheezing, shortness of breath followed by fever in the last 48 hours or so.  Found to have hyponatremia.  Imaging suggested the possibility of CHF although the patient is asymptomatic.

## 2021-02-11 NOTE — Assessment & Plan Note (Addendum)
--  stable, continue SSI, can resume Januvia and metformin on discharge

## 2021-02-11 NOTE — Assessment & Plan Note (Signed)
--   Resolved.  Suspect viral in nature.

## 2021-02-11 NOTE — Progress Notes (Addendum)
PROGRESS NOTE  Kristi Willis EZM:[id number] DOB: August 19, 1941 DOA: 02/10/2021 PCP: Sharion Balloon, FNP  Brief History   80 year old woman presented with several day history of wheezing, shortness of breath followed by fever in the last 48 hours or so.  Found to have hyponatremia.  Imaging suggested the possibility of CHF although the patient is asymptomatic.  A & P  * Hypo-osmolar hyponatremia -- On Lexapro but doubt this is a causative agent.  Hypochloremia combined with history of spending significant time outside in the heat gardening more suggestive of excessive solute loss not replaced --Curiously sodium has been corrected with 1 dose of Lasix, no fluids or other interventions. -- Patient asymptomatic and at baseline mentally per daughter at bedside. -- Supportive care.  Check BMP in AM.  Uncontrolled type 2 diabetes mellitus with diabetic neuropathy, without long-term current use of insulin (HCC) --stable, continue SSI, can resume Januvia and metformin on discharge  Asthma -- Appears stable at this time.  No wheezing on exam.  She does have some crackles at bases and I do To the abnormal imaging, however she has no evidence of volume overload or CHF and echocardiogram is reassuring.  I would favor some form of interstitial lung disease. -- Follow-up as an outpatient.  Continue bronchodilators.  Hypothyroidism --check TSH --continue levothyroxine  Fever -- Resolved.  Suspect viral in nature.  Aortic atherosclerosis ([place]) --continue statin  Primary progressive aphasia ([place]) --stable. Makes history a bit challenging.  Disposition Plan:  Discussion: Improved, follow sodium in a.m., monitor overnight, can likely return home tomorrow with daughter.  Status is: Inpatient  Remains inpatient appropriate because:Inpatient level of care appropriate due to severity of illness  Dispo: The patient is from: Home              Anticipated d/c is to: Home              Patient  currently is not medically stable to d/c.   Difficult to place patient No  DVT prophylaxis: enoxaparin (LOVENOX) injection 40 mg Start: 02/10/21 2200   Code Status: Full Code Level of care: Telemetry Family Communication: daughter at bedside  Murray Hodgkins, MD  [facility] Direct contact: see [email or web address] (further directions at bottom of note if needed) 7PM-7AM contact night coverage as at bottom of note 02/11/2021, 2:59 PM  LOS: 1 day   Significant Hospital Events      Consults:     Procedures:    Significant Diagnostic Tests:     Micro Data:  UC pendinge   Antimicrobials:  None   Interval History/Subjective  CC: f/u fever  No fever, no SOB, no wheezing now, feels fine Daughter at bedside, helps w/ history  Pt has primary progressive aphasia Patient lives alone and remains active, 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  Maintains a garden at home and spends a lot of time outside.  Review of Systems  Respiratory:  Negative for shortness of breath.   Gastrointestinal:  Negative for nausea and vomiting.   Objective   Vitals:  Vitals:   02/11/21 0847 02/11/21 1157  BP: (!) 150/58 (!) 131/49  Pulse: 89 70  Resp: 18 18  Temp: 99.3 F (37.4 C) 99.4 F (37.4 C)  SpO2: 95% 98%    Exam: Physical Exam Vitals and nursing note reviewed.  Constitutional:      Appearance: Normal appearance.  Cardiovascular:     Rate and Rhythm: Normal rate and regular rhythm.     Heart sounds:  No murmur heard. Pulmonary:     Effort: Pulmonary effort is normal. No respiratory distress.     Breath sounds: Normal breath sounds. No wheezing or rales.  Abdominal:     General: Abdomen is flat. There is no distension.     Palpations: Abdomen is soft.     Tenderness: There is no abdominal tenderness.  Musculoskeletal:     Right lower leg: No edema.     Left lower leg: No edema.  Skin:    General: Skin is warm.  Neurological:     Mental Status: She is alert.      Comments: Expressive aphasia and inappropriate responses noted  Psychiatric:        Mood and Affect: Mood normal.        Behavior: Behavior normal.    I have personally reviewed the labs and other data, making special note of:   Today's Data  CBG labile Na+ up to 132 Osmolality low 27 K+ 3.3 U/a positive but no symptoms Hgb stable 8.6  Scheduled Meds:  amLODipine  5 mg Oral Daily   aspirin  81 mg Oral QHS   doxazosin  4 mg Oral Daily   enoxaparin (LOVENOX) injection  40 mg Subcutaneous Q24H   escitalopram  20 mg Oral Daily   insulin aspart  0-15 Units Subcutaneous TID WC   insulin aspart  0-5 Units Subcutaneous QHS   ipratropium-albuterol  3 mL Nebulization BID   ketoconazole  1 application Topical BID   levothyroxine  75 mcg Oral Q0600   lisinopril  40 mg Oral QHS   loratadine  10 mg Oral Daily   metoprolol succinate  100 mg Oral Daily   montelukast  10 mg Oral QHS   rosuvastatin  20 mg Oral QHS   Continuous Infusions:  sodium chloride 10 mL/hr at 02/10/21 1532    Principal Problem:   Hypo-osmolar hyponatremia Active Problems:   Asthma   Uncontrolled type 2 diabetes mellitus with diabetic neuropathy, without long-term current use of insulin (HCC)   Hypothyroidism   Essential hypertension   Speech articulation disorder   Primary progressive aphasia ([place])   Aortic atherosclerosis ([place])   Fever   LOS: 1 day   How to contact the [facility] Attending or Consulting provider 7A - 7P or covering provider during after hours [place], for this patient?  Check the care team in [facility] and look for a) attending/consulting [facility] provider listed and b) the [facility] team listed Log into [email or web address] and use [place]'s universal password to access. If you do not have the password, please contact the hospital operator. Locate the [facility] provider you are looking for under [facility] and page to a number that you can be directly reached. If you still have difficulty reaching the provider,  please page the [facility] (Director on Call) for the Hospitalists listed on amion for assistance.

## 2021-02-11 NOTE — Assessment & Plan Note (Signed)
continue statin

## 2021-02-11 NOTE — Progress Notes (Signed)
  Echocardiogram 2D Echocardiogram has been performed.  Kristi Willis 02/11/2021, 11:17 AM

## 2021-02-11 NOTE — Assessment & Plan Note (Addendum)
--   TSH within normal limits --continue levothyroxine

## 2021-02-12 DIAGNOSIS — D649 Anemia, unspecified: Secondary | ICD-10-CM

## 2021-02-12 LAB — RETICULOCYTES
Immature Retic Fract: 14.2 % (ref 2.3–15.9)
RBC.: 2.9 MIL/uL — ABNORMAL LOW (ref 3.87–5.11)
Retic Count, Absolute: 36.8 10*3/uL (ref 19.0–186.0)
Retic Ct Pct: 1.3 % (ref 0.4–3.1)

## 2021-02-12 LAB — BASIC METABOLIC PANEL
Anion gap: 11 (ref 5–15)
BUN: 12 mg/dL (ref 8–23)
CO2: 29 mmol/L (ref 22–32)
Calcium: 8.7 mg/dL — ABNORMAL LOW (ref 8.9–10.3)
Chloride: 93 mmol/L — ABNORMAL LOW (ref 98–111)
Creatinine, Ser: 0.75 mg/dL (ref 0.44–1.00)
GFR, Estimated: >60 mL/min (ref >60)
Glucose, Bld: 156 mg/dL — ABNORMAL HIGH (ref 70–99)
Potassium: 3.2 mmol/L — ABNORMAL LOW (ref 3.5–5.1)
Sodium: 133 mmol/L — ABNORMAL LOW (ref 135–145)

## 2021-02-12 LAB — IRON AND TIBC
Iron: 31 ug/dL (ref 28–170)
Saturation Ratios: 10 % — ABNORMAL LOW (ref 10.4–31.8)
TIBC: 304 ug/dL (ref 250–450)
UIBC: 273 ug/dL

## 2021-02-12 LAB — CBC
HCT: 23.6 % — ABNORMAL LOW (ref 36.0–46.0)
Hemoglobin: 7.9 g/dL — ABNORMAL LOW (ref 12.0–15.0)
MCH: 27.8 pg (ref 26.0–34.0)
MCHC: 33.5 g/dL (ref 30.0–36.0)
MCV: 83.1 fL (ref 80.0–100.0)
Platelets: 224 10*3/uL (ref 150–400)
RBC: 2.84 MIL/uL — ABNORMAL LOW (ref 3.87–5.11)
RDW: 14.2 % (ref 11.5–15.5)
WBC: 7.7 10*3/uL (ref 4.0–10.5)
nRBC: 0 % (ref 0.0–0.2)

## 2021-02-12 LAB — VITAMIN B12: Vitamin B-12: 985 pg/mL — ABNORMAL HIGH (ref 180–914)

## 2021-02-12 LAB — FOLATE: Folate: 27.5 ng/mL (ref >5.9)

## 2021-02-12 LAB — HEMOGLOBIN A1C
Hgb A1c MFr Bld: 7.7 % — ABNORMAL HIGH (ref 4.8–5.6)
Mean Plasma Glucose: 174 mg/dL

## 2021-02-12 LAB — GLUCOSE, CAPILLARY: Glucose-Capillary: 168 mg/dL — ABNORMAL HIGH (ref 70–99)

## 2021-02-12 LAB — TSH: TSH: 2.092 u[IU]/mL (ref 0.350–4.500)

## 2021-02-12 LAB — FERRITIN: Ferritin: 193 ng/mL (ref 11–307)

## 2021-02-12 MED ORDER — POTASSIUM CHLORIDE CRYS ER 20 MEQ PO TBCR: 40.0000 meq | EXTENDED_RELEASE_TABLET | ORAL | Status: DC

## 2021-02-12 MED ORDER — DOXAZOSIN MESYLATE 8 MG PO TABS: 8.0000 mg | ORAL_TABLET | Freq: Every day | ORAL | Status: DC

## 2021-02-12 NOTE — Assessment & Plan Note (Signed)
--   Etiology unclear.  No evidence of bleeding.  Anemia panel was unremarkable.  Follow-up as an outpatient.  I did discuss with daughter.

## 2021-02-12 NOTE — Discharge Summary (Signed)
Physician Discharge Summary  Kristi Willis AGT:[id number] DOB: Dec 29, 1940 DOA: 02/10/2021  PCP: Sharion Balloon, FNP  Admit date: 02/10/2021 Discharge date: 02/12/2021  Recommendations for Outpatient Follow-up:  * Hypo-osmolar hyponatremia --suggest repeat BMP as outpatient  Anemia -- Etiology unclear.  No evidence of bleeding.  Anemia panel was unremarkable.  Follow-up as an outpatient.  I did discuss with daughter.   Follow-up Information     Sharion Balloon, FNP. Schedule an appointment as soon as possible for a visit in 1 week(s).   Specialty: Family Medicine Contact information: [place] [address] [phone]                  Discharge Diagnoses: Principal diagnosis is #1 Principal Problem:   Hypo-osmolar hyponatremia Active Problems:   Asthma   Uncontrolled type 2 diabetes mellitus with diabetic neuropathy, without long-term current use of insulin (HCC)   Hypothyroidism   Essential hypertension   Speech articulation disorder   Anemia   Primary progressive aphasia ([place])   Aortic atherosclerosis ([place])   Fever  Discharge Condition: improved Disposition: home  Diet recommendation:  Diet Orders (From admission, onward)     Start     Ordered   02/12/21 0000  Diet - low sodium heart healthy        02/12/21 1047   02/12/21 0000  Diet Carb Modified        02/12/21 1047   02/10/21 2054  Diet heart healthy/carb modified Room service appropriate? Yes; Fluid consistency: Thin  Diet effective now       Question Answer Comment  Diet-HS Snack? Nothing   Room service appropriate? Yes   Fluid consistency: Thin      02/10/21 2054             Filed Weights   02/10/21 2040 02/11/21 0500 02/12/21 0403  Weight: 75 kg 74.9 kg 73.1 kg    HPI/Hospital Course:   80 year old woman presented with several day history of wheezing, shortness of breath followed by fever in the previous 48 hours or so.  Found to have hyponatremia.   Imaging suggested the possibility of CHF although the patient is asymptomatic.  Admitted, rapidly improved with supportive care, without treatment except 1 dose of Lasix, sodium corrected.  Diabetes remains stable.  Although imaging suggested the possibility of CHF, she had no clinical signs or symptoms of volume overload.  Discussed in detail with daughter recommendation to obtain a scale and weigh daily current diet as needed Lasix dosing as already prescribed as an outpatient.  Patient back to baseline the following day per daughter and stable for discharge.  * Hypo-osmolar hyponatremia -- On Lexapro but doubt this is a causative agent.  Hypochloremia combined with history of spending significant time outside in the heat gardening more suggestive of excessive solute loss not replaced.  --Curiously sodium has been corrected with 1 dose of Lasix, no fluids or other interventions. -- Slightly improved, patient at baseline, expect spontaneous recovery.  No further evaluation suggested.    Uncontrolled type 2 diabetes mellitus with diabetic neuropathy, without long-term current use of insulin (HCC) --stable, continue SSI, can resume Januvia and metformin on discharge  Asthma -- Appears stable at this time.  No wheezing on exam.  She does have some crackles at bases however she has no evidence of volume overload or CHF and echocardiogram is reassuring.  I would favor some form of interstitial lung disease. -- Follow-up as an outpatient.  Continue bronchodilators.  Hypothyroidism -- TSH within normal limits --continue levothyroxine  Fever -- Resolved.  Suspect viral in nature.  Aortic atherosclerosis ([place]) --continue statin  Primary progressive aphasia ([place]) --stable.   Anemia -- Etiology unclear.  No evidence of bleeding.  Anemia panel was unremarkable.  Follow-up as an outpatient.  I did discuss with daughter.    Today's assessment: S: CC: f/u fever  Feels good, breathing well, eating  fine Daughter feels patient back to baseline Did break a tooth and hoping to leave early today to get to the dentis  O: Vitals:  Vitals:   02/12/21 0401 02/12/21 0851  BP: (!) 120/58   Pulse: 75   Resp: 19   Temp: 97.7 F (36.5 C)   SpO2: 94% 91%    Constitutional:  Appears calm and comfortable Respiratory:  CTA bilaterally, no w/r/r.  Respiratory effort normal.  Cardiovascular:  RRR, no m/r/g No LE extremity edema   Psychiatric:  Mental status Mood, affect appropriate  BMP, CBC and anemia panel noted  Discharge Instructions  Discharge Instructions     (HEART FAILURE PATIENTS) Call MD:  Anytime you have any of the following symptoms: 1) 3 pound weight gain in 24 hours or 5 pounds in 1 week 2) shortness of breath, with or without a dry hacking cough 3) swelling in the hands, feet or stomach 4) if you have to sleep on extra pillows at night in order to breathe.   Complete by: As directed    Diet - low sodium heart healthy   Complete by: As directed    Diet Carb Modified   Complete by: As directed    Discharge instructions   Complete by: As directed    Call your physician or seek immediate medical attention for fever, shortness of breath, pain or worsening of condition.   Increase activity slowly   Complete by: As directed       Allergies as of 02/12/2021       Reactions   Dimetapp C [phenylephrine-bromphen-codeine] Itching   Phenylephrine Hcl Itching   Ultram [tramadol Hcl] Other (See Comments)   Insomnia   Celebrex [celecoxib] Rash   Cortisone Rash   Fenofibrate Rash   Sulfa Antibiotics Rash        Medication List     STOP taking these medications    naproxen 500 MG tablet Commonly known as: Naprosyn       TAKE these medications    Accu-Chek Aviva Plus test strip Generic drug: glucose blood CHECK BLOOD SUGAR ONCE DAILY OR AS DIRECTED Dx E11.40   Accu-Chek Aviva Plus w/Device Kit Check sugars daily & as needed Dx E11.9   Accu-Chek  Softclix Lancets lancets CHECK BLOOD SUGAR ONCE DAILY OR AS DIRECTED Dx E11.40   acetaminophen 325 MG tablet Commonly known as: TYLENOL Take 2 tablets (650 mg total) by mouth every 6 (six) hours as needed for mild pain or fever (or Fever >/= 101).   acyclovir ointment 5 % Commonly known as: ZOVIRAX APPLY TO THE AFFECTED AREA AROUND MOUTH EVERY 3 HOURS AS DIRECTED UNTIL CLEAR   albuterol 108 (90 Base) MCG/ACT inhaler Commonly known as: Ventolin HFA Inhale 2 puffs into the lungs every 6 (six) hours as needed for wheezing or shortness of breath.   albuterol (2.5 MG/3ML) 0.083% nebulizer solution Commonly known as: PROVENTIL Take 3 mLs (2.5 mg total) by nebulization every 6 (six) hours as needed for wheezing or shortness of breath.   amLODipine 5 MG tablet Commonly known as: NORVASC  Take 1 tablet (5 mg total) by mouth daily.   aspirin 81 MG chewable tablet Chew 81 mg by mouth at bedtime.   azelastine 0.1 % nasal spray Commonly known as: ASTELIN Place 2 sprays into both nostrils 2 (two) times daily.   COQ10 PO Take 1 tablet by mouth daily.   doxazosin 8 MG tablet Commonly known as: CARDURA Take 1 tablet (8 mg total) by mouth daily. 1/2 tablet by mouth daily   EpiPen 2-Pak 0.3 mg/0.3 mL Soaj injection Generic drug: EPINEPHrine Inject 0.3 mg into the muscle as needed for anaphylaxis. Reported on 01/24/2016   escitalopram 20 MG tablet Commonly known as: LEXAPRO TAKE 1 TABLET DAILY   furosemide 20 MG tablet Commonly known as: LASIX Take 1 tablet (20 mg total) by mouth daily as needed for fluid (Weight gain of 3 lbs in a day or 5 lbs in a week).   ipratropium-albuterol 0.5-2.5 (3) MG/3ML Soln Commonly known as: DUONEB Take 3 mLs by nebulization 2 (two) times daily.   ketoconazole 2 % cream Commonly known as: NIZORAL Apply 1 application topically See admin instructions. Apply topically to bottoms of feet twice daily   levothyroxine 75 MCG tablet Commonly known as:  SYNTHROID Take 1 tablet (75 mcg total) by mouth daily before breakfast.   lisinopril 40 MG tablet Commonly known as: ZESTRIL TAKE 1 TABLET DAILY   loratadine 10 MG tablet Commonly known as: CLARITIN Take 1 tablet (10 mg total) by mouth daily.   metFORMIN 1000 MG tablet Commonly known as: GLUCOPHAGE TAKE  (1)  TABLET TWICE A DAY WITH MEALS (BREAKFAST AND SUPPER) What changed:  how much to take how to take this when to take this additional instructions   metoprolol succinate 100 MG 24 hr tablet Commonly known as: TOPROL-XL Take 1 tablet (100 mg total) by mouth daily.   montelukast 10 MG tablet Commonly known as: SINGULAIR Take 1 tablet (10 mg total) by mouth at bedtime.   rosuvastatin 20 MG tablet Commonly known as: Crestor Take 1 tablet (20 mg total) by mouth at bedtime.   sitaGLIPtin 50 MG tablet Commonly known as: JANUVIA Take 50 mg by mouth daily.       Allergies  Allergen Reactions   Dimetapp C [Phenylephrine-Bromphen-Codeine] Itching   Phenylephrine Hcl Itching   Ultram [Tramadol Hcl] Other (See Comments)    Insomnia   Celebrex [Celecoxib] Rash   Cortisone Rash   Fenofibrate Rash   Sulfa Antibiotics Rash    The results of significant diagnostics from this hospitalization (including imaging, microbiology, ancillary and laboratory) are listed below for reference.    Significant Diagnostic Studies: DG Chest 2 View  Result Date: 02/10/2021 CLINICAL DATA:  Fever, cough EXAM: CHEST - 2 VIEW COMPARISON:  02/08/2021 FINDINGS: Cardiomegaly. Mild, diffuse interstitial pulmonary opacity and trace bilateral pleural effusions. The visualized skeletal structures are unremarkable. IMPRESSION: Cardiomegaly with mild, diffuse interstitial pulmonary opacity and trace bilateral pleural effusions, findings consistent with edema. No focal airspace opacity. Electronically Signed   By: Eddie Candle M.D.   On: 02/10/2021 14:23   DG Chest 2 View  Result Date:  02/09/2021 CLINICAL DATA:  80 year old female with history of wheezing and fever. EXAM: CHEST - 2 VIEW COMPARISON:  Chest x-ray 12/06/2019. FINDINGS: Lung volumes are normal. No consolidative airspace disease. No pleural effusions. No pneumothorax. No evidence of pulmonary edema. No suspicious appearing pulmonary nodules or masses are noted. Mild cardiomegaly. Upper mediastinal contours are within normal limits. IMPRESSION: 1. No radiographic evidence of  acute cardiopulmonary disease. 2. Mild cardiomegaly. Electronically Signed   By: Vinnie Langton M.D.   On: 02/09/2021 10:45   CT Angio Chest PE W/Cm &/Or Wo Cm  Result Date: 02/10/2021 CLINICAL DATA:  Fever and productive cough. EXAM: CT ANGIOGRAPHY CHEST WITH CONTRAST TECHNIQUE: Multidetector CT imaging of the chest was performed using the standard protocol during bolus administration of intravenous contrast. Multiplanar CT image reconstructions and MIPs were obtained to evaluate the vascular anatomy. CONTRAST:  74m OMNIPAQUE IOHEXOL 350 MG/ML SOLN COMPARISON:  Chest x-ray from same day. CTA chest dated August 04, 2019. FINDINGS: Cardiovascular: Satisfactory opacification of the pulmonary arteries to the segmental level. No evidence of pulmonary embolism. Unchanged mild cardiomegaly. Trace pericardial effusion. No thoracic aortic aneurysm or dissection. Coronary, aortic arch, and branch vessel atherosclerotic vascular disease. Mediastinum/Nodes: Multiple enlarged mediastinal lymph nodes measuring up to 1.4 cm in short axis, unchanged. The thyroid gland, trachea, and esophagus demonstrate no significant findings. Lungs/Pleura: Relatively unchanged small right greater than left pleural effusions. Scattered mild smooth interlobular septal thickening. No consolidation or pneumothorax. Upper Abdomen: No acute abnormality. Musculoskeletal: No chest wall abnormality. No acute or significant osseous findings. Review of the MIP images confirms the above findings.  IMPRESSION: 1. No evidence of pulmonary embolism. 2. Mild congestive heart failure. 3. Unchanged mediastinal lymphadenopathy, likely reactive. 4. Aortic Atherosclerosis (ICD10-I70.0). Electronically Signed   By: WTitus DubinM.D.   On: 02/10/2021 15:26   ECHOCARDIOGRAM COMPLETE  Result Date: 02/11/2021    ECHOCARDIOGRAM REPORT   Patient Name:   Kristi HARNEYDate of Exam: 02/11/2021 Medical Rec #:  [id number]          Height:       64.0 in Accession #:    [id number]         Weight:       165.1 lb Date of Birth:  101/13/1942          BSA:          1.803 m Patient Age:    89years            BP:           150/58 mmHg Patient Gender: F                   HR:           77 bpm. Exam Location:  Inpatient Procedure: 2D Echo, Cardiac Doppler and Color Doppler Indications:    I50.33 Acute on chronic diastolic (congestive) heart failure  History:        Patient has prior history of Echocardiogram examinations, most                 recent 07/29/2019. Risk Factors:Hypertension, Diabetes,                 Dyslipidemia and GERD.  Sonographer:    TJonelle SidleDance Referring Phys: 2[place] 1. Left ventricular ejection fraction, by estimation, is 60 to 65%. The left ventricle has normal function. The left ventricle has no regional wall motion abnormalities. There is mild left ventricular hypertrophy. Left ventricular diastolic parameters are indeterminate.  2. Right ventricular systolic function is normal. The right ventricular size is normal. Tricuspid regurgitation signal is inadequate for assessing PA pressure.  3. Left atrial size was moderately dilated.  4. Right atrial size was mildly dilated.  5. The mitral valve is degenerative. Trivial mitral valve regurgitation. Moderate mitral annular calcification.  6. The aortic valve is tricuspid. Aortic valve regurgitation is not visualized. No aortic stenosis is present.  7. The inferior vena cava is dilated in size with <50% respiratory variability,  suggesting right atrial pressure of 15 mmHg. FINDINGS  Left Ventricle: Left ventricular ejection fraction, by estimation, is 60 to 65%. The left ventricle has normal function. The left ventricle has no regional wall motion abnormalities. The left ventricular internal cavity size was normal in size. There is  mild left ventricular hypertrophy. Left ventricular diastolic parameters are indeterminate. Right Ventricle: The right ventricular size is normal. No increase in right ventricular wall thickness. Right ventricular systolic function is normal. Tricuspid regurgitation signal is inadequate for assessing PA pressure. Left Atrium: Left atrial size was moderately dilated. Right Atrium: Right atrial size was mildly dilated. Pericardium: Trivial pericardial effusion is present. Mitral Valve: The mitral valve is degenerative in appearance. Moderate mitral annular calcification. Trivial mitral valve regurgitation. Tricuspid Valve: The tricuspid valve is normal in structure. Tricuspid valve regurgitation is trivial. Aortic Valve: The aortic valve is tricuspid. Aortic valve regurgitation is not visualized. No aortic stenosis is present. Pulmonic Valve: The pulmonic valve was not well visualized. Pulmonic valve regurgitation is not visualized. Aorta: The aortic root and ascending aorta are structurally normal, with no evidence of dilitation. Venous: The inferior vena cava is dilated in size with less than 50% respiratory variability, suggesting right atrial pressure of 15 mmHg. IAS/Shunts: The interatrial septum was not well visualized.  LEFT VENTRICLE PLAX 2D LVIDd:         4.90 cm  Diastology LVIDs:         2.80 cm  LV e' medial:    8.59 cm/s LV PW:         1.30 cm  LV E/e' medial:  16.6 LV IVS:        0.80 cm  LV e' lateral:   9.79 cm/s LVOT diam:     1.80 cm  LV E/e' lateral: 14.6 LV SV:         40 LV SV Index:   22 LVOT Area:     2.54 cm  RIGHT VENTRICLE             IVC RV Basal diam:  2.90 cm     IVC diam: 2.40 cm RV  S prime:     13.70 cm/s TAPSE (M-mode): 2.3 cm LEFT ATRIUM              Index       RIGHT ATRIUM           Index LA diam:        4.60 cm  2.55 cm/m  RA Area:     20.30 cm LA Vol (A2C):   106.0 ml 58.78 ml/m RA Volume:   54.30 ml  30.11 ml/m LA Vol (A4C):   57.3 ml  31.77 ml/m LA Biplane Vol: 77.8 ml  43.14 ml/m  AORTIC VALVE LVOT Vmax:   79.60 cm/s LVOT Vmean:  52.600 cm/s LVOT VTI:    0.157 m  AORTA Ao Root diam: 2.90 cm Ao Asc diam:  3.30 cm MITRAL VALVE MV Area (PHT): 3.12 cm     SHUNTS MV Decel Time: 243 msec     Systemic VTI:  0.16 m MV E velocity: 143.00 cm/s  Systemic Diam: 1.80 cm MV A velocity: 83.20 cm/s MV E/A ratio:  1.72 Oswaldo Milian MD Electronically signed by Oswaldo Milian MD Signature Date/Time: 02/11/2021/11:33:27 AM    Final  Microbiology: Recent Results (from the past 240 hour(s))  Microscopic Examination     Status: Abnormal   Collection Time: 02/08/21 10:33 AM   Urine  Result Value Ref Range Status   WBC, UA 0-5 0 - 5 /hpf Final   RBC 0-2 0 - 2 /hpf Final   Epithelial Cells (non renal) 0-10 0 - 10 /hpf Final   Renal Epithel, UA 0-10 (A) None seen /hpf Final   Casts Present (A) None seen /lpf Final   Cast Type Granular casts (A) N/A Final   Bacteria, UA Few None seen/Few Final  Urine Culture     Status: None   Collection Time: 02/08/21 11:04 AM   Specimen: Urine   UR  Result Value Ref Range Status   Urine Culture, Routine Final report  Final   Organism ID, Bacteria No growth  Final  Novel Coronavirus, NAA ([facility])     Status: None   Collection Time: 02/08/21 11:05 AM   Specimen: Nasopharyngeal(NP) swabs in vial transport medium  Result Value Ref Range Status   SARS-CoV-2, NAA Not Detected Not Detected Final    Comment: This nucleic acid amplification test was developed and its performance characteristics determined by [employer]. Nucleic acid amplification tests include RT-PCR and TMA. This test has not been FDA cleared or approved.  This test has been authorized by FDA under an Emergency Use Authorization (EUA). This test is only authorized for the duration of time the declaration that circumstances exist justifying the authorization of the emergency use of in vitro diagnostic tests for detection of SARS-CoV-2 virus and/or diagnosis of COVID-19 infection under section 564(b)(1) of the Act, 21 U.S.C. 025ENI-7(P) (1), unless the authorization is terminated or revoked sooner. When diagnostic testing is negative, the possibility of a false negative result should be considered in the context of a patient's recent exposures and the presence of clinical signs and symptoms consistent with COVID-19. An individual without symptoms of COVID-19 and who is not shedding SARS-CoV-2 virus wo uld expect to have a negative (not detected) result in this assay.   SARS-COV-2, NAA 2 DAY TAT     Status: None   Collection Time: 02/08/21 11:05 AM  Result Value Ref Range Status   SARS-CoV-2, NAA 2 DAY TAT Performed  Final  Resp Panel by RT-PCR (Flu A&B, Covid) Nasopharyngeal Swab     Status: None   Collection Time: 02/10/21  3:37 PM   Specimen: Nasopharyngeal Swab; Nasopharyngeal(NP) swabs in vial transport medium  Result Value Ref Range Status   SARS Coronavirus 2 by RT PCR NEGATIVE NEGATIVE Final    Comment: (NOTE) SARS-CoV-2 target nucleic acids are NOT DETECTED.  The SARS-CoV-2 RNA is generally detectable in upper respiratory specimens during the acute phase of infection. The lowest concentration of SARS-CoV-2 viral copies this assay can detect is 138 copies/mL. A negative result does not preclude SARS-Cov-2 infection and should not be used as the sole basis for treatment or other patient management decisions. A negative result may occur with  improper specimen collection/handling, submission of specimen other than nasopharyngeal swab, presence of viral mutation(s) within the areas targeted by this assay, and inadequate number of  viral copies(<138 copies/mL). A negative result must be combined with clinical observations, patient history, and epidemiological information. The expected result is Negative.  Fact Sheet for Patients:  EntrepreneurPulse.com.au  Fact Sheet for Healthcare Providers:  IncredibleEmployment.be  This test is no t yet approved or cleared by the [place] and  has been authorized  for detection and/or diagnosis of SARS-CoV-2 by FDA under an Emergency Use Authorization (EUA). This EUA will remain  in effect (meaning this test can be used) for the duration of the COVID-19 declaration under Section 564(b)(1) of the Act, 21 U.S.C.section 360bbb-3(b)(1), unless the authorization is terminated  or revoked sooner.       Influenza A by PCR NEGATIVE NEGATIVE Final   Influenza B by PCR NEGATIVE NEGATIVE Final    Comment: (NOTE) The Xpert Xpress SARS-CoV-2/FLU/RSV plus assay is intended as an aid in the diagnosis of influenza from Nasopharyngeal swab specimens and should not be used as a sole basis for treatment. Nasal washings and aspirates are unacceptable for Xpert Xpress SARS-CoV-2/FLU/RSV testing.  Fact Sheet for Patients: EntrepreneurPulse.com.au  Fact Sheet for Healthcare Providers: IncredibleEmployment.be  This test is not yet approved or cleared by the [place] FDA and has been authorized for detection and/or diagnosis of SARS-CoV-2 by FDA under an Emergency Use Authorization (EUA). This EUA will remain in effect (meaning this test can be used) for the duration of the COVID-19 declaration under Section 564(b)(1) of the Act, 21 U.S.C. section 360bbb-3(b)(1), unless the authorization is terminated or revoked.  Performed at [facility], [address] [place], [place] [address]      Labs: Basic Metabolic Panel: Recent Labs  Lab 02/10/21 1332 02/11/21 0459 02/12/21 0441   NA 123* 132* 133*  K 3.9 3.3* 3.2*  CL 87* 91* 93*  CO2 '25 29 29  ' GLUCOSE 222* 165* 156*  BUN '11 9 12  ' CREATININE 0.67 0.62 0.75  CALCIUM 8.6* 8.7* 8.7*   Liver Function Tests: Recent Labs  Lab 02/10/21 1332 02/11/21 0459  AST 33 26  ALT 30 30  ALKPHOS 63 65  BILITOT 1.3* 1.2  PROT 6.2* 7.0  ALBUMIN 3.9 3.7    CBC: Recent Labs  Lab 02/10/21 1332 02/11/21 0459 02/12/21 0441  WBC 10.8* 10.8* 7.7  NEUTROABS 8.4*  --   --   HGB 8.7* 8.6* 7.9*  HCT 25.4* 25.5* 23.6*  MCV 80.6 81.5 83.1  PLT 155 189 224    Recent Labs    02/10/21 1428  BNP 182.3*    CBG: Recent Labs  Lab 02/11/21 0741 02/11/21 1155 02/11/21 1658 02/11/21 2037 02/12/21 0744  GLUCAP 183* 304* 193* 238* 168*    Principal Problem:   Hypo-osmolar hyponatremia Active Problems:   Asthma   Uncontrolled type 2 diabetes mellitus with diabetic neuropathy, without long-term current use of insulin (HCC)   Hypothyroidism   Essential hypertension   Speech articulation disorder   Anemia   Primary progressive aphasia (HCC)   Aortic atherosclerosis (HCC)   Fever   Time coordinating discharge: 25 minutes  Signed:  Murray Hodgkins, MD  [facility]  02/12/2021, 3:00 PM

## 2021-02-14 ENCOUNTER — Telehealth: Payer: Self-pay

## 2021-02-14 NOTE — Telephone Encounter (Signed)
Transition Care Management Follow-up Telephone Call Date of discharge and from where: 02/12/21 [facility] Diagnosis:  hyponatremia How have you been since you were released from the hospital? Stable, still weak Any questions or concerns? No - her daughter is upset because when she came to [facility] before going to the hospital, we didn't draw labs and she feels that this would have kept her out of the hospital  Items Reviewed: Did the pt receive and understand the discharge instructions provided? Yes  Medications obtained and verified? Yes  Other? No  Any new allergies since your discharge? No  Dietary orders reviewed? Yes Do you have support at home?  She lives alone, but her daughter lives close by and checks on her several times per day and brings her meals  Home Care and Equipment/Supplies: Were home health services ordered? no  Were any new equipment or medical supplies ordered?  No  Functional Questionnaire: (I = Independent and D = Dependent) ADLs: I  Bathing/Dressing- I  Meal Prep- D - daughter brings her meals - she is eating well  Eating- I  Maintaining continence- I  Transferring/Ambulation- I  Managing Meds- D  Follow up appointments reviewed:  PCP Hospital f/u appt confirmed? Yes  Scheduled to see Evelina Dun on 02/20/21 @ 3:10. [facility] f/u appt confirmed? No   Are transportation arrangements needed? No  If their condition worsens, is the pt aware to call PCP or go to the Emergency Dept.? Yes Was the patient provided with contact information for the PCP's office or ED? Yes Was to pt encouraged to call back with questions or concerns? Yes

## 2021-02-15 DIAGNOSIS — J3089 Other allergic rhinitis: Secondary | ICD-10-CM | POA: Diagnosis not present

## 2021-02-15 DIAGNOSIS — J3081 Allergic rhinitis due to animal (cat) (dog) hair and dander: Secondary | ICD-10-CM | POA: Diagnosis not present

## 2021-02-15 DIAGNOSIS — J301 Allergic rhinitis due to pollen: Secondary | ICD-10-CM | POA: Diagnosis not present

## 2021-02-20 ENCOUNTER — Encounter: Payer: Self-pay | Admitting: Family

## 2021-02-20 ENCOUNTER — Ambulatory Visit (INDEPENDENT_AMBULATORY_CARE_PROVIDER_SITE_OTHER): Payer: Medicare PPO | Admitting: Family

## 2021-02-20 ENCOUNTER — Other Ambulatory Visit: Payer: Self-pay

## 2021-02-20 VITALS — BP 122/55 | HR 62 | Temp 97.0°F | Ht 64.0 in | Wt 159.6 lb

## 2021-02-20 DIAGNOSIS — G3101 Pick's disease: Secondary | ICD-10-CM | POA: Diagnosis not present

## 2021-02-20 DIAGNOSIS — Z09 Encounter for follow-up examination after completed treatment for conditions other than malignant neoplasm: Secondary | ICD-10-CM | POA: Diagnosis not present

## 2021-02-20 DIAGNOSIS — F0280 Dementia in other diseases classified elsewhere without behavioral disturbance: Secondary | ICD-10-CM | POA: Diagnosis not present

## 2021-02-20 DIAGNOSIS — D649 Anemia, unspecified: Secondary | ICD-10-CM

## 2021-02-20 DIAGNOSIS — E871 Hypo-osmolality and hyponatremia: Secondary | ICD-10-CM | POA: Diagnosis not present

## 2021-02-20 DIAGNOSIS — I509 Heart failure, unspecified: Secondary | ICD-10-CM

## 2021-02-20 LAB — MICROSCOPIC EXAMINATION: RBC, Urine: NONE SEEN /hpf (ref 0–2)

## 2021-02-20 LAB — URINALYSIS, COMPLETE
Bilirubin, UA: NEGATIVE
Glucose, UA: NEGATIVE
Ketones, UA: NEGATIVE
Nitrite, UA: NEGATIVE
Specific Gravity, UA: >1.03 — ABNORMAL HIGH (ref 1.005–1.030)
Urobilinogen, Ur: 0.2 mg/dL (ref 0.2–1.0)
pH, UA: 5.5 (ref 5.0–7.5)

## 2021-02-20 NOTE — Patient Instructions (Signed)
Heart Failure, Diagnosis  Heart failure is a condition in which the heart has trouble pumping blood. This may mean that the heart cannot pump enough blood out to the body or that the heart does not fill up with enough blood. For some people with heart failure, fluid may back up into the lungs. There may also be swelling (edema) in the lower legs. Heart failure is usually a long-term (chronic) condition. It is important for you to take good care of yourself and followthe treatment plan from your health care provider. What are the causes? This condition may be caused by: High blood pressure (hypertension). Hypertension causes the heart muscle to work harder than normal. Coronary artery disease, or CAD. CAD is the buildup of cholesterol and fat (plaque) in the arteries of the heart. Heart attack, also called myocardial infarction. This injures the heart muscle, making it hard for the heart to pump blood. Abnormal heart valves. The valves do not open and close properly, forcing the heart to pump harder to keep the blood flowing. Heart muscle disease, inflammation, or infection (cardiomyopathy or myocarditis). This is damage to the heart muscle. It can increase the risk of heart failure. Lung disease. The heart works harder when the lungs are not healthy. What increases the risk? The risk of heart failure increases as a person ages. This condition is also more likely to develop in people who: Are obese. Are female. Use tobacco or nicotine products. Abuse alcohol or drugs. Have taken medicines that can damage the heart, such as chemotherapy drugs. Have any of these conditions: Diabetes. Abnormal heart rhythms. Thyroid problems. Low blood counts (anemia). Chronic kidney disease. Have a family history of heart failure. What are the signs or symptoms? Symptoms of this condition include: Shortness of breath with activity, such as when climbing stairs. A cough that does not go away. Swelling of the  feet, ankles, legs, or abdomen. Losing or gaining weight for no reason. Trouble breathing when lying flat. Waking from sleep because of the need to sit up and get more air. Rapid heartbeat. Tiredness (fatigue) and loss of energy. Feeling light-headed, dizzy, or close to fainting. Nausea or loss of appetite. Waking up more often during the night to urinate (nocturia). Confusion. How is this diagnosed? This condition is diagnosed based on: Your medical history, symptoms, and a physical exam. Diagnostic tests, which may include: Echocardiogram. Electrocardiogram (ECG). Chest X-ray. Blood tests. Exercise stress test. Cardiac MRI. Cardiac catheterization and angiogram. Radionuclide scans. How is this treated? Treatment for this condition is aimed at managing the symptoms of heart failure. Medicines Treatment may include medicines that: Help lower blood pressure by relaxing (dilating) the blood vessels. These medicines are called ACE inhibitors (angiotensin-converting enzyme), ARBs (angiotensin receptor blockers), or vasodilators. Cause the kidneys to remove salt and water from the blood through urination (diuretics). Improve heart muscle strength and prevent the heart from beating too fast (beta blockers). Increase the force of the heartbeat (digoxin). Lower heart rates. Certain diabetes medicines (SGLT-2 inhibitors) may also be used in treatment. Healthy behavior changes Treatment may also include making healthy lifestyle changes, such as: Reaching and staying at a healthy weight. Not using tobacco or nicotine products. Eating heart-healthy foods. Limiting or avoiding alcohol. Stopping the use of illegal drugs. Being physically active. Participating in a cardiac rehabilitation program, which is a treatment program to improve your health and well-being through exercise training, education, and counseling. Other treatments Other treatments may include: Procedures to open blocked  arteries or repair   damaged valves. Placing a pacemaker to improve heart function (cardiac resynchronization therapy). Placing a device to treat serious abnormal heart rhythms (implantable cardioverter defibrillator, or ICD). Placing a device to improve the pumping ability of the heart (left ventricular assist device, or LVAD). Receiving a healthy heart from a donor (heart transplant). This is done when other treatments have not helped. Follow these instructions at home: Manage other health conditions as told by your health care provider. These may include hypertension, diabetes, thyroid disease, or abnormal heart rhythms. Get ongoing education and support as needed. Learn as much as you can about heart failure. Keep all follow-up visits. This is important. Summary Heart failure is a condition in which the heart has trouble pumping blood. This condition is commonly caused by high blood pressure and other diseases of the heart and lungs. Symptoms of this condition include shortness of breath, tiredness (fatigue), nausea, and swelling of the feet, ankles, legs, or abdomen. Treatments for this condition may include medicines, lifestyle changes, and surgery. Manage other health conditions as told by your health care provider. This information is not intended to replace advice given to you by your health care provider. Make sure you discuss any questions you have with your healthcare provider. Document Revised: 02/26/2020 Document Reviewed: 02/26/2020 Elsevier Patient Education  2022 [employer] Inc[employer]  

## 2021-02-20 NOTE — Progress Notes (Signed)
Subjective:    Patient ID: Kristi Willis, female    DOB: Dec 26, 1940, 80 y.o.   MRN: [id number]  Chief Complaint  Patient presents with   Transitions Of Care    HPI Today's visit was for Transitional Care Management.  The patient was discharged from [facility] on 02/12/21 with a primary diagnosis of hyponatremia.   Contact with the patient and/or caregiver, by a clinical staff member, was made on 02/14/21 and was documented as a telephone encounter within the EMR.  Through chart review and discussion with the patient I have determined that management of their condition is of high complexity.    Pt went to the ED  with weakness and found to be hyponatremia. She had a chest x-ray that showed possible CHF. She was given one dose of Lasix and this resolved. No hx of CHF prior.    She also found to be anemic.   Pt reports she feeling much better.   Review of Systems  All other systems reviewed and are negative.     Objective:   Physical Exam Vitals reviewed.  Constitutional:      General: She is not in acute distress.    Appearance: She is well-developed.  HENT:     Head: Normocephalic and atraumatic.     Right Ear: Tympanic membrane normal.     Left Ear: Tympanic membrane normal.  Eyes:     Pupils: Pupils are equal, round, and reactive to light.  Neck:     Thyroid: No thyromegaly.  Cardiovascular:     Rate and Rhythm: Normal rate and regular rhythm.     Heart sounds: Normal heart sounds. No murmur heard. Pulmonary:     Effort: Pulmonary effort is normal. No respiratory distress.     Breath sounds: Normal breath sounds. No wheezing.  Abdominal:     General: Bowel sounds are normal. There is no distension.     Palpations: Abdomen is soft.     Tenderness: There is no abdominal tenderness.  Musculoskeletal:        General: No tenderness. Normal range of motion.     Cervical back: Normal range of motion and neck supple.  Skin:    General: Skin is warm and dry.   Neurological:     Mental Status: She is alert.     Cranial Nerves: No cranial nerve deficit.     Deep Tendon Reflexes: Reflexes are normal and symmetric.     Comments: Aphasia   Psychiatric:        Behavior: Behavior normal.        Thought Content: Thought content normal.        Judgment: Judgment normal.      BP (!) 122/55   Pulse 62   Temp (!) 97 F (36.1 C) (Temporal)   Ht _0  (1.626 m)   Wt 159 lb 9.6 oz (72.4 kg)   SpO2 97%   BMI 27.40 kg/m      Assessment & Plan:  Kristi Willis comes in today with chief complaint of Transitions Of Care   Diagnosis and orders addressed:  1. Hospital discharge follow-up - CBC with Differential/Platelet - CMP14+EGFR - Urinalysis, Complete - Urine Culture  2. Hyponatremia - CBC with Differential/Platelet - CMP14+EGFR  3. Congestive heart failure, unspecified HF chronicity, unspecified heart failure type ([place]) - CBC with Differential/Platelet - CMP14+EGFR  4. Anemia, unspecified type - CBC with Differential/Platelet - CMP14+EGFR - Urinalysis, Complete - Urine Culture  5. Primary  progressive aphasia [facility])   Labs pending Health Maintenance reviewed Diet and exercise encouraged  Follow up plan: 4 months    Evelina Dun, FNP

## 2021-02-21 LAB — CBC WITH DIFFERENTIAL/PLATELET
Basophils Absolute: 0.1 10*3/uL (ref 0.0–0.2)
Basos: 1 %
EOS (ABSOLUTE): 0.3 10*3/uL (ref 0.0–0.4)
Eos: 3 %
Hematocrit: 30.8 % — ABNORMAL LOW (ref 34.0–46.6)
Hemoglobin: 9.8 g/dL — ABNORMAL LOW (ref 11.1–15.9)
Immature Grans (Abs): 0 10*3/uL (ref 0.0–0.1)
Immature Granulocytes: 0 %
Lymphocytes Absolute: 2.7 10*3/uL (ref 0.7–3.1)
Lymphs: 27 %
MCH: 27.2 pg (ref 26.6–33.0)
MCHC: 31.8 g/dL (ref 31.5–35.7)
MCV: 86 fL (ref 79–97)
Monocytes Absolute: 0.6 10*3/uL (ref 0.1–0.9)
Monocytes: 6 %
Neutrophils Absolute: 6.3 10*3/uL (ref 1.4–7.0)
Neutrophils: 63 %
Platelets: 368 10*3/uL (ref 150–450)
RBC: 3.6 x10E6/uL — ABNORMAL LOW (ref 3.77–5.28)
RDW: 13.9 % (ref 11.7–15.4)
WBC: 10 10*3/uL (ref 3.4–10.8)

## 2021-02-21 LAB — CMP14+EGFR
ALT: 26 IU/L (ref 0–32)
AST: 29 IU/L (ref 0–40)
Albumin/Globulin Ratio: 2.2 (ref 1.2–2.2)
Albumin: 4.3 g/dL (ref 3.7–4.7)
Alkaline Phosphatase: 66 IU/L (ref 44–121)
BUN/Creatinine Ratio: 22 (ref 12–28)
BUN: 14 mg/dL (ref 8–27)
Bilirubin Total: 0.3 mg/dL (ref 0.0–1.2)
CO2: 23 mmol/L (ref 20–29)
Calcium: 9.3 mg/dL (ref 8.7–10.3)
Chloride: 98 mmol/L (ref 96–106)
Creatinine, Ser: 0.63 mg/dL (ref 0.57–1.00)
Globulin, Total: 2 g/dL (ref 1.5–4.5)
Glucose: 165 mg/dL — ABNORMAL HIGH (ref 65–99)
Potassium: 4.7 mmol/L (ref 3.5–5.2)
Sodium: 137 mmol/L (ref 134–144)
Total Protein: 6.3 g/dL (ref 6.0–8.5)
eGFR: 90 mL/min/{1.73_m2} (ref >59)

## 2021-02-22 LAB — URINE CULTURE

## 2021-02-28 ENCOUNTER — Other Ambulatory Visit: Payer: Self-pay | Admitting: Family

## 2021-03-05 ENCOUNTER — Telehealth: Payer: Self-pay | Admitting: Family

## 2021-03-05 ENCOUNTER — Ambulatory Visit (INDEPENDENT_AMBULATORY_CARE_PROVIDER_SITE_OTHER): Payer: Medicare PPO

## 2021-03-05 VITALS — Ht 64.0 in | Wt 160.0 lb

## 2021-03-05 DIAGNOSIS — Z0000 Encounter for general adult medical examination without abnormal findings: Secondary | ICD-10-CM | POA: Diagnosis not present

## 2021-03-05 NOTE — Patient Instructions (Signed)
Ms. Yaklin , Thank you for taking time to come for your Medicare Wellness Visit. I appreciate your ongoing commitment to your health goals. Please review the following plan we discussed and let me know if I can assist you in the future.   Screening recommendations/referrals: Colonoscopy: No longer required Mammogram: Done 09/29/2020 - Repeat annually  Bone Density: Done 06/20/2015 - Repeat every 3-5 years *do at next visit Recommended yearly ophthalmology/optometry visit for glaucoma screening and checkup Recommended yearly dental visit for hygiene and checkup  Vaccinations: Influenza vaccine: Done 07/19/2020 - Repeat annually  Pneumococcal vaccine: Done 05/31/2014 & 06/15/2019 Tdap vaccine: Done 02/02/2015 - Repeat in 10 years  Shingles vaccine: Due. Shingrix discussed. Please contact your pharmacy for coverage information.     Covid-19: Done 09/23/19, 10/22/19, & 07/18/2020  Advanced directives: Advance directive discussed with you today. Even though you declined this today, please call our office should you change your mind, and we can give you the proper paperwork for you to fill out.   Conditions/risks identified: Aim for 30 minutes of exercise or brisk walking each day, drink 6-8 glasses of water and eat lots of fruits and vegetables.   Next appointment: Follow up in one year for your annual wellness visit    Preventive Care 65 Years and Older, Female Preventive care refers to lifestyle choices and visits with your health care provider that can promote health and wellness. What does preventive care include? A yearly physical exam. This is also called an annual well check. Dental exams once or twice a year. Routine eye exams. Ask your health care provider how often you should have your eyes checked. Personal lifestyle choices, including: Daily care of your teeth and gums. Regular physical activity. Eating a healthy diet. Avoiding tobacco and drug use. Limiting alcohol  use. Practicing safe sex. Taking low-dose aspirin every day. Taking vitamin and mineral supplements as recommended by your health care provider. What happens during an annual well check? The services and screenings done by your health care provider during your annual well check will depend on your age, overall health, lifestyle risk factors, and family history of disease. Counseling  Your health care provider may ask you questions about your: Alcohol use. Tobacco use. Drug use. Emotional well-being. Home and relationship well-being. Sexual activity. Eating habits. History of falls. Memory and ability to understand (cognition). Work and work Statistician. Reproductive health. Screening  You may have the following tests or measurements: Height, weight, and BMI. Blood pressure. Lipid and cholesterol levels. These may be checked every 5 years, or more frequently if you are over 34 years old. Skin check. Lung cancer screening. You may have this screening every year starting at age 55 if you have a 30-pack-year history of smoking and currently smoke or have quit within the past 15 years. Fecal occult blood test (FOBT) of the stool. You may have this test every year starting at age 35. Flexible sigmoidoscopy or colonoscopy. You may have a sigmoidoscopy every 5 years or a colonoscopy every 10 years starting at age 90. Hepatitis C blood test. Hepatitis B blood test. Sexually transmitted disease (STD) testing. Diabetes screening. This is done by checking your blood sugar (glucose) after you have not eaten for a while (fasting). You may have this done every 1-3 years. Bone density scan. This is done to screen for osteoporosis. You may have this done starting at age 36. Mammogram. This may be done every 1-2 years. Talk to your health care provider about how often  you should have regular mammograms. Talk with your health care provider about your test results, treatment options, and if necessary,  the need for more tests. Vaccines  Your health care provider may recommend certain vaccines, such as: Influenza vaccine. This is recommended every year. Tetanus, diphtheria, and acellular pertussis (Tdap, Td) vaccine. You may need a Td booster every 10 years. Zoster vaccine. You may need this after age 66. Pneumococcal 13-valent conjugate (PCV13) vaccine. One dose is recommended after age 72. Pneumococcal polysaccharide (PPSV23) vaccine. One dose is recommended after age 60. Talk to your health care provider about which screenings and vaccines you need and how often you need them. This information is not intended to replace advice given to you by your health care provider. Make sure you discuss any questions you have with your health care provider. Document Released: 09/01/2015 Document Revised: 04/24/2016 Document Reviewed: 06/06/2015 Elsevier Interactive Patient Education  2017 [place] Prevention in the Home Falls can cause injuries. They can happen to people of all ages. There are many things you can do to make your home safe and to help prevent falls. What can I do on the outside of my home? Regularly fix the edges of walkways and driveways and fix any cracks. Remove anything that might make you trip as you walk through a door, such as a raised step or threshold. Trim any bushes or trees on the path to your home. Use bright outdoor lighting. Clear any walking paths of anything that might make someone trip, such as rocks or tools. Regularly check to see if handrails are loose or broken. Make sure that both sides of any steps have handrails. Any raised decks and porches should have guardrails on the edges. Have any leaves, snow, or ice cleared regularly. Use sand or salt on walking paths during winter. Clean up any spills in your garage right away. This includes oil or grease spills. What can I do in the bathroom? Use night lights. Install grab bars by the toilet and in the  tub and shower. Do not use towel bars as grab bars. Use non-skid mats or decals in the tub or shower. If you need to sit down in the shower, use a plastic, non-slip stool. Keep the floor dry. Clean up any water that spills on the floor as soon as it happens. Remove soap buildup in the tub or shower regularly. Attach bath mats securely with double-sided non-slip rug tape. Do not have throw rugs and other things on the floor that can make you trip. What can I do in the bedroom? Use night lights. Make sure that you have a light by your bed that is easy to reach. Do not use any sheets or blankets that are too big for your bed. They should not hang down onto the floor. Have a firm chair that has side arms. You can use this for support while you get dressed. Do not have throw rugs and other things on the floor that can make you trip. What can I do in the kitchen? Clean up any spills right away. Avoid walking on wet floors. Keep items that you use a lot in easy-to-reach places. If you need to reach something above you, use a strong step stool that has a grab bar. Keep electrical cords out of the way. Do not use floor polish or wax that makes floors slippery. If you must use wax, use non-skid floor wax. Do not have throw rugs and other things on  the floor that can make you trip. What can I do with my stairs? Do not leave any items on the stairs. Make sure that there are handrails on both sides of the stairs and use them. Fix handrails that are broken or loose. Make sure that handrails are as long as the stairways. Check any carpeting to make sure that it is firmly attached to the stairs. Fix any carpet that is loose or worn. Avoid having throw rugs at the top or bottom of the stairs. If you do have throw rugs, attach them to the floor with carpet tape. Make sure that you have a light switch at the top of the stairs and the bottom of the stairs. If you do not have them, ask someone to add them for  you. What else can I do to help prevent falls? Wear shoes that: Do not have high heels. Have rubber bottoms. Are comfortable and fit you well. Are closed at the toe. Do not wear sandals. If you use a stepladder: Make sure that it is fully opened. Do not climb a closed stepladder. Make sure that both sides of the stepladder are locked into place. Ask someone to hold it for you, if possible. Clearly mark and make sure that you can see: Any grab bars or handrails. First and last steps. Where the edge of each step is. Use tools that help you move around (mobility aids) if they are needed. These include: Canes. Walkers. Scooters. Crutches. Turn on the lights when you go into a dark area. Replace any light bulbs as soon as they burn out. Set up your furniture so you have a clear path. Avoid moving your furniture around. If any of your floors are uneven, fix them. If there are any pets around you, be aware of where they are. Review your medicines with your doctor. Some medicines can make you feel dizzy. This can increase your chance of falling. Ask your doctor what other things that you can do to help prevent falls. This information is not intended to replace advice given to you by your health care provider. Make sure you discuss any questions you have with your health care provider. Document Released: 06/01/2009 Document Revised: 01/11/2016 Document Reviewed: 09/09/2014 Elsevier Interactive Patient Education  2017 [employer].

## 2021-03-05 NOTE — Progress Notes (Signed)
Subjective:   Kristi Willis is a 80 y.o. female who presents for Medicare Annual (Subsequent) preventive examination.  Virtual Visit via Telephone Note  I connected with  Kristi Willis on 03/05/21 at  2:45 PM EDT by telephone and verified that I am speaking with the correct person using two identifiers.  Location: Patient: Home Provider: [facility] Persons participating in the virtual visit: patient, daughter Penny/Nurse Health Advisor   I discussed the limitations, risks, security and privacy concerns of performing an evaluation and management service by telephone and the availability of in person appointments. The patient expressed understanding and agreed to proceed.  Interactive audio and video telecommunications were attempted between this nurse and patient, however failed, due to patient having technical difficulties OR patient did not have access to video capability.  We continued and completed visit with audio only.  Some vital signs may be absent or patient reported.   Laramie Meissner E Lundynn Cohoon, LPN   Review of Systems     Cardiac Risk Factors include: advanced age (>84mn, >>24women);diabetes mellitus;dyslipidemia;hypertension;sedentary lifestyle;Other (see comment), Risk factor comments: asthma, pulmonary congestion     Objective:    Today's Vitals   03/05/21 1441  Weight: 160 lb (72.6 kg)  Height: '5\' 4"'  (1.626 m)  PainSc: 3    Body mass index is 27.46 kg/m.  Advanced Directives 03/05/2021 02/10/2021 02/24/2020 11/08/2019 02/17/2019 11/21/2017 10/10/2016  Does Patient Have a Medical Advance Directive? No No Yes Yes Yes Yes No  Type of Advance Directive - - HInformation systems managerof AFrees[employer] Semicon[employer]Power of APickensLiving will -  Does patient want to make changes to medical advance directive? - - No - Patient declined - No - Patient declined No - Patient declined -  Copy of [place] Chart? - - No - copy requested - No - copy  requested No - copy requested -  Would patient like information on creating a medical advance directive? No - Guardian declined No - Guardian declined;No - Patient declined - - - - -    Current Medications (verified) Outpatient Encounter Medications as of 03/05/2021  Medication Sig   Accu-Chek Softclix Lancets lancets CHECK BLOOD SUGAR ONCE DAILY OR AS DIRECTED Dx E11.40   acetaminophen (TYLENOL) 325 MG tablet Take 2 tablets (650 mg total) by mouth every 6 (six) hours as needed for mild pain or fever (or Fever >/= 101).   acyclovir ointment (ZOVIRAX) 5 % APPLY TO THE AFFECTED AREA AROUND MOUTH EVERY 3 HOURS AS DIRECTED UNTIL CLEAR (Patient not taking: Reported on 03/05/2021)   albuterol (PROVENTIL) (2.5 MG/3ML) 0.083% nebulizer solution Take 3 mLs (2.5 mg total) by nebulization every 6 (six) hours as needed for wheezing or shortness of breath.   albuterol (VENTOLIN HFA) 108 (90 Base) MCG/ACT inhaler Inhale 2 puffs into the lungs every 6 (six) hours as needed for wheezing or shortness of breath.   amLODipine (NORVASC) 5 MG tablet Take 1 tablet (5 mg total) by mouth daily.   aspirin 81 MG chewable tablet Chew 81 mg by mouth at bedtime.   azelastine (ASTELIN) 0.1 % nasal spray Place 2 sprays into both nostrils 2 (two) times daily. (Patient not taking: Reported on 03/05/2021)   Blood Glucose Monitoring Suppl (ACCU-CHEK AVIVA PLUS) w/Device KIT Check sugars daily & as needed Dx E11.9   Coenzyme Q10 (COQ10 PO) Take 1 tablet by mouth daily.   doxazosin (CARDURA) 8 MG tablet Take 1 tablet (8 mg total) by  mouth daily. 1/2 tablet by mouth daily   EPIPEN 2-PAK 0.3 MG/0.3ML SOAJ injection Inject 0.3 mg into the muscle as needed for anaphylaxis. Reported on 01/24/2016   escitalopram (LEXAPRO) 20 MG tablet TAKE 1 TABLET DAILY   glucose blood (ACCU-CHEK AVIVA PLUS) test strip CHECK BLOOD SUGAR ONCE DAILY OR AS DIRECTED Dx E11.40   ipratropium-albuterol (DUONEB) 0.5-2.5 (3) MG/3ML SOLN Take 3 mLs by nebulization 2  (two) times daily. (Patient not taking: Reported on 03/05/2021)   ketoconazole (NIZORAL) 2 % cream Apply 1 application topically See admin instructions. Apply topically to bottoms of feet twice daily   levothyroxine (SYNTHROID) 75 MCG tablet Take 1 tablet (75 mcg total) by mouth daily before breakfast.   lisinopril (ZESTRIL) 40 MG tablet TAKE 1 TABLET DAILY   loratadine (CLARITIN) 10 MG tablet Take 1 tablet (10 mg total) by mouth daily.   metFORMIN (GLUCOPHAGE) 1000 MG tablet TAKE  (1)  TABLET TWICE A DAY WITH MEALS (BREAKFAST AND SUPPER)   metoprolol succinate (TOPROL-XL) 100 MG 24 hr tablet Take 1 tablet (100 mg total) by mouth daily.   montelukast (SINGULAIR) 10 MG tablet Take 1 tablet (10 mg total) by mouth at bedtime.   rosuvastatin (CRESTOR) 20 MG tablet Take 1 tablet (20 mg total) by mouth at bedtime.   sitaGLIPtin (JANUVIA) 50 MG tablet Take 50 mg by mouth daily.   [DISCONTINUED] furosemide (LASIX) 20 MG tablet Take 1 tablet (20 mg total) by mouth daily as needed for fluid (Weight gain of 3 lbs in a day or 5 lbs in a week). (Patient not taking: Reported on 02/10/2021)   No facility-administered encounter medications on file as of 03/05/2021.    Allergies (verified) Dimetapp c [phenylephrine-bromphen-codeine], Phenylephrine hcl, Ultram [tramadol hcl], Celebrex [celecoxib], Cortisone, Fenofibrate, and Sulfa antibiotics   History: Past Medical History:  Diagnosis Date   Allergy    Asthma    Cancer ([place])    skin   Diabetes mellitus    GERD (gastroesophageal reflux disease)    hiatal hernia   Hypercholesteremia    Hypertension    Neuromuscular disorder (HCC)    DM neuropathy   Neuropathy    SVT (supraventricular tachycardia) (HCC)    Past Surgical History:  Procedure Laterality Date   BREAST BIOPSY     left breast biopsy   LUMBAR LAMINECTOMY  08/10   SKIN CANCER EXCISION  2005   SPINE SURGERY     lumbar laminectomy   Family History  Problem Relation Age of Onset   Cancer  Mother 41       cervical cancer   Depression Mother    Heart disease Father    Stroke Father    Cancer Father        lung cancer   Heart disease Sister 71       MI   Hypertension Sister    Cancer Sister 43       breast cancer   Healthy Daughter    Social History   Socioeconomic History   Marital status: Widowed    Spouse name: Not on file   Number of children: 1   Years of education: Not on file   Highest education level: 12th grade  Occupational History   Occupation: Retired from [employer]: retired  Tobacco Use   Smoking status: Never   Smokeless tobacco: Never  Vaping Use   Vaping Use: Never used  Substance and Sexual Activity   Alcohol use: No  Drug use: No   Sexual activity: Not Currently  Other Topics Concern   Not on file  Social History Narrative   Lives home alone - daughter lives about 5 miles away.   2022 - recently dx with primary progressive aphasia per daughter - she still seems to be cognitively aware - cooks, cleans, gardens, cans, drives locally, etc - just has a difficult time communicating   Active in church; some intermittent depression since losing her husband.   Social Determinants of Health   Financial Resource Strain: Low Risk    Difficulty of Paying Living Expenses: Not hard at all  Food Insecurity: No Food Insecurity   Worried About Charity fundraiser in the Last Year: Never true   [place] in the Last Year: Never true  Transportation Needs: No Transportation Needs   Lack of Transportation (Medical): No   Lack of Transportation (Non-Medical): No  Physical Activity: Insufficiently Active   Days of Exercise per Week: 7 days   Minutes of Exercise per Session: 10 min  Stress: No Stress Concern Present   Feeling of Stress : Not at all  Social Connections: Moderately Integrated   Frequency of Communication with Friends and Family: More than three times a week   Frequency of Social Gatherings with Friends and Family: More  than three times a week   Attends Religious Services: More than 4 times per year   Active Member of [employer] or Organizations: Yes   Attends Archivist Meetings: More than 4 times per year   Marital Status: Widowed    Tobacco Counseling Counseling given: Not Answered   Clinical Intake:  Pre-visit preparation completed: Yes  Pain : No/denies pain Pain Score: 3      BMI - recorded: 27.46 Nutritional Status: BMI 25 -29 Overweight Nutritional Risks: None Diabetes: Yes CBG done?: No Did pt. bring in CBG monitor from home?: No  How often do you need to have someone help you when you read instructions, pamphlets, or other written materials from your doctor or pharmacy?: 1 - Never  Nutrition Risk Assessment:  Has the patient had any N/V/D within the last 2 months?  No  Does the patient have any non-healing wounds?  No  Has the patient had any unintentional weight loss or weight gain?  No   Diabetes:  Is the patient diabetic?  Yes  If diabetic, was a CBG obtained today?  No  Did the patient bring in their glucometer from home?  No  How often do you monitor your CBG's? Once daily.   Financial Strains and Diabetes Management:  Are you having any financial strains with the device, your supplies or your medication? No .  Does the patient want to be seen by Chronic Care Management for management of their diabetes?  No  Would the patient like to be referred to a Nutritionist or for Diabetic Management?  No   Diabetic Exams:  Diabetic Eye Exam: Completed 06/26/2020.   Diabetic Foot Exam: Completed 05/09/2020. Pt has been advised about the importance in completing this exam. Pt is scheduled for diabetic foot exam on next Endocrinology visit.    Interpreter Needed?: No  Information entered by :: Mariesa Grieder, LPN   Activities of Daily Living In your present state of health, do you have any difficulty performing the following activities: 03/05/2021 02/10/2021  Hearing? Y Y   Comment - Has hearing aides  Vision? - N  Difficulty concentrating or making decisions? Tempie Donning  Comment - a little per daughter  Walking or climbing stairs? N N  Dressing or bathing? N N  Doing errands, shopping? N N  Preparing Food and eating ? N -  Using the Toilet? N -  In the past six months, have you accidently leaked urine? N -  Do you have problems with loss of bowel control? N -  Managing your Medications? N -  Managing your Finances? N -  Housekeeping or managing your Housekeeping? N -  Some recent data might be hidden    Patient Care Team: Sharion Balloon, FNP as PCP - General (Family Medicine) Deboraha Sprang, MD as Consulting Physician (Cardiology) Monna Fam, MD as Consulting Physician (Ophthalmology) Cameron Sprang, MD as Consulting Physician (Neurology)  Indicate any recent Medical Services you may have received from other than [facility] providers in the past year (date may be approximate).     Assessment:   This is a routine wellness examination for [place].  Hearing/Vision screen Hearing Screening - Comments:: Wears hearing aids - annual to semi-annual f/u with [facility] Vision Screening - Comments:: Wears eyeglasses - up to date with annual eye exams with Dr Herbert Deaner  Dietary issues and exercise activities discussed: Current Exercise Habits: Home exercise routine, Type of exercise: walking;Other - see comments (working in the garden and house work), Time (Minutes): 10, Frequency (Times/Week): 7, Weekly Exercise (Minutes/Week): 70, Intensity: Mild, Exercise limited by: respiratory conditions(s);Other - see comments (neuropathy)   Goals Addressed             This Visit's Progress    Prevent falls   On track      Depression Screen PHQ 2/9 Scores 03/05/2021 03/23/2020 02/24/2020 02/17/2019 10/29/2018 08/31/2018 07/06/2018  PHQ - 2 Score 0 0 0 0 2 0 0  PHQ- 9 Score - 2 - - 6 - -    Fall Risk Fall Risk  03/05/2021 10/20/2020 03/23/2020 02/24/2020 02/17/2019  Falls in the  past year? 0 0 0 0 1  Number falls in past yr: 0 - - - 1  Injury with Fall? 0 - - - 0  Risk for fall due to : Mental status change;Impaired vision;Other (Comment) - - - History of fall(s);Impaired balance/gait  Risk for fall due to: Comment neuropathy - - - -  Follow up Falls prevention discussed - - - -    FALL RISK PREVENTION PERTAINING TO THE HOME:  Any stairs in or around the home? Yes  If so, are there any without handrails? No  Home free of loose throw rugs in walkways, pet beds, electrical cords, etc? Yes  Adequate lighting in your home to reduce risk of falls? Yes   ASSISTIVE DEVICES UTILIZED TO PREVENT FALLS:  Life alert? No  Use of a cane, walker or w/c? No  Grab bars in the bathroom? No  Shower chair or bench in shower? No  Elevated toilet seat or a handicapped toilet? No   TIMED UP AND GO:  Was the test performed? No . Telephonic visit  Cognitive Function: Cognitive status assessed by direct observation. Patient has current diagnosis of cognitive impairment. Patient is followed by neurology for ongoing assessment. Patient is unable to complete screening 6CIT or MMSE.   MMSE - Mini Mental State Exam 09/17/2017 06/05/2016 12/22/2014  Orientation to time '5 5 5  ' Orientation to Place '5 5 5  ' Registration '3 3 3  ' Attention/ Calculation '5 5 5  ' Recall '3 3 3  ' Language- name 2 objects 2 2  2  Language- repeat 0 0 0  Language- follow 3 step command '3 3 3  ' Language- read & follow direction '1 1 1  ' Write a sentence '1 1 1  ' Copy design '1 1 1  ' Total score '29 29 29   ' Montreal Cognitive Assessment  06/10/2016  Visuospatial/ Executive (0/5) 5  Naming (0/3) 3  Attention: Read list of digits (0/2) 1  Attention: Read list of letters (0/1) 0  Attention: Serial 7 subtraction starting at 100 (0/3) 1  Language: Repeat phrase (0/2) 1  Language : Fluency (0/1) 0  Abstraction (0/2) 2  Delayed Recall (0/5) 5  Orientation (0/6) 5  Total 23  Adjusted Score (based on education) 24   6CIT  Screen 02/24/2020 02/17/2019  What Year? 0 points 0 points  What month? 0 points 0 points  What time? 0 points 0 points  Count back from 20 0 points 0 points  Months in reverse 2 points 0 points  Repeat phrase 4 points 0 points  Total Score 6 0    Immunizations Immunization History  Administered Date(s) Administered   Fluad Quad(high Dose 65+) 06/29/2019   Influenza, High Dose Seasonal PF 08/29/2014, 06/13/2016, 07/03/2017, 08/06/2017, 06/16/2018, 07/15/2018, 06/15/2019, 07/19/2020   Influenza,inj,Quad PF,6+ Mos 05/20/2013, 05/31/2014, 07/18/2015   Influenza-Unspecified 08/20/2011   Moderna Sars-Covid-2 Vaccination 09/23/2019, 10/22/2019, 07/18/2020   PFIZER(Purple Top)SARS-COV-2 Vaccination 07/19/2020   Pneumococcal Conjugate-13 05/31/2014   Pneumococcal Polysaccharide-23 07/22/2012, 06/14/2015, 03/04/2016, 11/22/2016, 08/06/2017, 07/15/2018, 06/15/2019   Tdap 02/02/2015    TDAP status: Up to date  Flu Vaccine status: Up to date  Pneumococcal vaccine status: Up to date  Covid-19 vaccine status: Completed vaccines  Qualifies for Shingles Vaccine? Yes   Zostavax completed No   Shingrix Completed?: No.    Education has been provided regarding the importance of this vaccine. Patient has been advised to call insurance company to determine out of pocket expense if they have not yet received this vaccine. Advised may also receive vaccine at local pharmacy or Health Dept. Verbalized acceptance and understanding.  Screening Tests Health Maintenance  Topic Date Due   Zoster Vaccines- Shingrix (1 of 2) Never done   OPHTHALMOLOGY EXAM  01/22/2018   DEXA SCAN  06/19/2018   FOOT EXAM  07/03/2018   COVID-19 Vaccine (5 - Booster for Moderna series) 11/17/2020   INFLUENZA VACCINE  03/19/2021   HEMOGLOBIN A1C  08/13/2021   TETANUS/TDAP  02/01/2025   Hepatitis C Screening  Completed   PNA vac Low Risk Adult  Completed   HPV VACCINES  Aged Out    Health Maintenance  Health Maintenance  Due  Topic Date Due   Zoster Vaccines- Shingrix (1 of 2) Never done   OPHTHALMOLOGY EXAM  01/22/2018   DEXA SCAN  06/19/2018   FOOT EXAM  07/03/2018   COVID-19 Vaccine (5 - Booster for Moderna series) 11/17/2020    Colorectal cancer screening: No longer required.   Mammogram status: Completed 09/19/2020. Repeat every year  Bone Density status: Completed 06/20/2015. Results reflect: Bone density results: NORMAL. Repeat every 3 years.  Lung Cancer Screening: (Low Dose CT Chest recommended if Age 41-80 years, 30 pack-year currently smoking OR have quit w/in 15years.) does not qualify.   Additional Screening:  Hepatitis C Screening: does not qualify; Completed 10/20/2020  Vision Screening: Recommended annual ophthalmology exams for early detection of glaucoma and other disorders of the eye. Is the patient up to date with their annual eye exam?  Yes  Who is the provider or  what is the name of the office in which the patient attends annual eye exams? Herbert Deaner If pt is not established with a provider, would they like to be referred to a provider to establish care? No .   Dental Screening: Recommended annual dental exams for proper oral hygiene  Community Resource Referral / Chronic Care Management: CRR required this visit?  No   CCM required this visit?  No      Plan:     I have personally reviewed and noted the following in the patient's chart:   Medical and social history Use of alcohol, tobacco or illicit drugs  Current medications and supplements including opioid prescriptions.  Functional ability and status Nutritional status Physical activity Advanced directives List of other physicians Hospitalizations, surgeries, and ER visits in previous 12 months Vitals Screenings to include cognitive, depression, and falls Referrals and appointments  In addition, I have reviewed and discussed with patient certain preventive protocols, quality metrics, and best practice  recommendations. A written personalized care plan for preventive services as well as general preventive health recommendations were provided to patient.     Sandrea Hammond, LPN   0/71/2524   Nurse Notes: None

## 2021-03-05 NOTE — Telephone Encounter (Signed)
Called no answer no voicemail

## 2021-03-06 ENCOUNTER — Other Ambulatory Visit: Payer: Self-pay

## 2021-03-06 ENCOUNTER — Ambulatory Visit (INDEPENDENT_AMBULATORY_CARE_PROVIDER_SITE_OTHER): Payer: Medicare PPO | Admitting: *Deleted

## 2021-03-06 DIAGNOSIS — Z23 Encounter for immunization: Secondary | ICD-10-CM

## 2021-03-06 NOTE — Telephone Encounter (Signed)
Spoke to daughter - pt will have at next [facility]

## 2021-03-14 DIAGNOSIS — J301 Allergic rhinitis due to pollen: Secondary | ICD-10-CM | POA: Diagnosis not present

## 2021-03-14 DIAGNOSIS — J3089 Other allergic rhinitis: Secondary | ICD-10-CM | POA: Diagnosis not present

## 2021-03-14 DIAGNOSIS — J3081 Allergic rhinitis due to animal (cat) (dog) hair and dander: Secondary | ICD-10-CM | POA: Diagnosis not present

## 2021-03-21 DIAGNOSIS — J3081 Allergic rhinitis due to animal (cat) (dog) hair and dander: Secondary | ICD-10-CM | POA: Diagnosis not present

## 2021-03-21 DIAGNOSIS — J301 Allergic rhinitis due to pollen: Secondary | ICD-10-CM | POA: Diagnosis not present

## 2021-03-21 DIAGNOSIS — J3089 Other allergic rhinitis: Secondary | ICD-10-CM | POA: Diagnosis not present

## 2021-03-22 DIAGNOSIS — B351 Tinea unguium: Secondary | ICD-10-CM | POA: Diagnosis not present

## 2021-03-22 DIAGNOSIS — L84 Corns and callosities: Secondary | ICD-10-CM | POA: Diagnosis not present

## 2021-03-22 DIAGNOSIS — E1142 Type 2 diabetes mellitus with diabetic polyneuropathy: Secondary | ICD-10-CM | POA: Diagnosis not present

## 2021-03-22 DIAGNOSIS — M79676 Pain in unspecified toe(s): Secondary | ICD-10-CM | POA: Diagnosis not present

## 2021-03-27 ENCOUNTER — Other Ambulatory Visit: Payer: Self-pay | Admitting: Family

## 2021-03-27 DIAGNOSIS — E1142 Type 2 diabetes mellitus with diabetic polyneuropathy: Secondary | ICD-10-CM | POA: Diagnosis not present

## 2021-03-27 DIAGNOSIS — E785 Hyperlipidemia, unspecified: Secondary | ICD-10-CM | POA: Diagnosis not present

## 2021-03-27 DIAGNOSIS — E039 Hypothyroidism, unspecified: Secondary | ICD-10-CM | POA: Diagnosis not present

## 2021-03-27 DIAGNOSIS — I10 Essential (primary) hypertension: Secondary | ICD-10-CM | POA: Diagnosis not present

## 2021-03-27 DIAGNOSIS — F325 Major depressive disorder, single episode, in full remission: Secondary | ICD-10-CM | POA: Diagnosis not present

## 2021-03-27 DIAGNOSIS — J449 Chronic obstructive pulmonary disease, unspecified: Secondary | ICD-10-CM | POA: Diagnosis not present

## 2021-03-27 DIAGNOSIS — J301 Allergic rhinitis due to pollen: Secondary | ICD-10-CM | POA: Diagnosis not present

## 2021-03-27 DIAGNOSIS — I471 Supraventricular tachycardia: Secondary | ICD-10-CM | POA: Diagnosis not present

## 2021-03-27 DIAGNOSIS — G8929 Other chronic pain: Secondary | ICD-10-CM | POA: Diagnosis not present

## 2021-03-28 DIAGNOSIS — J3081 Allergic rhinitis due to animal (cat) (dog) hair and dander: Secondary | ICD-10-CM | POA: Diagnosis not present

## 2021-03-28 DIAGNOSIS — J301 Allergic rhinitis due to pollen: Secondary | ICD-10-CM | POA: Diagnosis not present

## 2021-03-28 DIAGNOSIS — J3089 Other allergic rhinitis: Secondary | ICD-10-CM | POA: Diagnosis not present

## 2021-04-02 DIAGNOSIS — J301 Allergic rhinitis due to pollen: Secondary | ICD-10-CM | POA: Diagnosis not present

## 2021-04-02 DIAGNOSIS — J3089 Other allergic rhinitis: Secondary | ICD-10-CM | POA: Diagnosis not present

## 2021-04-02 DIAGNOSIS — J3081 Allergic rhinitis due to animal (cat) (dog) hair and dander: Secondary | ICD-10-CM | POA: Diagnosis not present

## 2021-04-09 ENCOUNTER — Encounter: Payer: Self-pay | Admitting: Nurse Practitioner

## 2021-04-09 ENCOUNTER — Ambulatory Visit (INDEPENDENT_AMBULATORY_CARE_PROVIDER_SITE_OTHER): Payer: Medicare PPO | Admitting: Nurse Practitioner

## 2021-04-09 VITALS — BP 143/58 | HR 68 | Temp 97.2°F

## 2021-04-09 DIAGNOSIS — L989 Disorder of the skin and subcutaneous tissue, unspecified: Secondary | ICD-10-CM

## 2021-04-09 DIAGNOSIS — H5711 Ocular pain, right eye: Secondary | ICD-10-CM

## 2021-04-09 DIAGNOSIS — E871 Hypo-osmolality and hyponatremia: Secondary | ICD-10-CM

## 2021-04-09 DIAGNOSIS — R059 Cough, unspecified: Secondary | ICD-10-CM

## 2021-04-09 MED ORDER — CHERATUSSIN AC 100-10 MG/5ML PO SOLN: 5.0000 mL | ORAL | 0 refills | Status: DC | PRN

## 2021-04-09 MED ORDER — PREDNISONE 20 MG PO TABS: 40.0000 mg | ORAL_TABLET | Freq: Every day | ORAL | 0 refills | Status: AC

## 2021-04-09 NOTE — Progress Notes (Signed)
Subjective:    Patient ID: Kristi Willis, female    DOB: 12-01-1940, 80 y.o.   MRN: [id number]   Chief Complaint: Cellulitis   HPI Patient comes in today with several complaints: she was scheduled in covid clinic because she has a cough. Has history of asthma and this is normal for her. - has a sore place on her right lower leg. Not sure how she got it. Yesterday it was worse.looks much better today. - right upper eyelid. Started last Thursday. Hard to assess because she has progressive dysphagia.   Was here a couple pf months ago with low potassium and sodium- daughter wants labs rechecked.  Review of Systems  Constitutional:  Negative for diaphoresis.  Eyes:  Negative for pain.  Respiratory:  Negative for shortness of breath.   Cardiovascular:  Negative for chest pain, palpitations and leg swelling.  Gastrointestinal:  Negative for abdominal pain.  Endocrine: Negative for polydipsia.  Skin:  Negative for rash.  Neurological:  Negative for dizziness, weakness and headaches.  Hematological:  Does not bruise/bleed easily.  All other systems reviewed and are negative.     Objective:   Physical Exam Vitals and nursing note reviewed.  Constitutional:      Appearance: Normal appearance.  Eyes:     General:        Right eye: Discharge present.     Pupils: Pupils are equal, round, and reactive to light.     Comments: No right upper lid edema No erythema  Cardiovascular:     Rate and Rhythm: Normal rate and regular rhythm.     Heart sounds: Normal heart sounds.  Pulmonary:     Effort: Pulmonary effort is normal.     Breath sounds: Normal breath sounds.  Skin:    General: Skin is warm.     Comments: 2 Cm annular lesion on right lower shin- no drainage  Neurological:     General: No focal deficit present.     Mental Status: She is alert and oriented to person, place, and time.  Psychiatric:        Mood and Affect: Mood normal.        Behavior: Behavior normal.    BP (!) 143/58   Pulse 68   Temp (!) 97.2 F (36.2 C) (Temporal)   SpO2 96%         Assessment & Plan:  Kristi Willis in today with chief complaint of Cellulitis (Right lower leg x 2 days. Small spot on leg ), Cough (X 3 day ), and Eye Pain (X 2 days)   1. Skin sore Clan daily with antibiotic ointment. Antibiotic ointment as she has been using  2. Pain of right eye Move eye appointment to sooner then october.  3 cough Force fluids Meds ordered this encounter  Medications   guaiFENesin-codeine (CHERATUSSIN AC) 100-10 MG/5ML syrup    Sig: Take 5 mLs by mouth every 4 (four) hours as needed for cough.    Dispense:  180 mL    Refill:  0    Order Specific Question:   Supervising Provider    Answer:   Caryl Pina A [[id number]]   predniSONE (DELTASONE) 20 MG tablet    Sig: Take 2 tablets (40 mg total) by mouth daily with breakfast for 5 days. 2 po daily for 5 days    Dispense:  10 tablet    Refill:  0    Order Specific Question:   Supervising Provider  Answer:   Worthy Rancher [id number]    The above assessment and management plan was discussed with the patient. The patient verbalized understanding of and has agreed to the management plan. Patient is aware to call the clinic if symptoms persist or worsen. Patient is aware when to return to the clinic for a follow-up visit. Patient educated on when it is appropriate to go to the emergency department.   Mary-Margaret Hassell Done, FNP

## 2021-04-09 NOTE — Addendum Note (Signed)
Addended by: Chevis Pretty on: 04/09/2021 04:24 PM   Modules accepted: Orders

## 2021-04-10 LAB — CMP14+EGFR
ALT: 16 IU/L (ref 0–32)
AST: 20 IU/L (ref 0–40)
Albumin/Globulin Ratio: 2.4 — ABNORMAL HIGH (ref 1.2–2.2)
Albumin: 4.6 g/dL (ref 3.7–4.7)
Alkaline Phosphatase: 53 IU/L (ref 44–121)
BUN/Creatinine Ratio: 16 (ref 12–28)
BUN: 11 mg/dL (ref 8–27)
Bilirubin Total: 0.4 mg/dL (ref 0.0–1.2)
CO2: 24 mmol/L (ref 20–29)
Calcium: 9.4 mg/dL (ref 8.7–10.3)
Chloride: 98 mmol/L (ref 96–106)
Creatinine, Ser: 0.68 mg/dL (ref 0.57–1.00)
Globulin, Total: 1.9 g/dL (ref 1.5–4.5)
Glucose: 144 mg/dL — ABNORMAL HIGH (ref 65–99)
Potassium: 4 mmol/L (ref 3.5–5.2)
Sodium: 138 mmol/L (ref 134–144)
Total Protein: 6.5 g/dL (ref 6.0–8.5)
eGFR: 89 mL/min/{1.73_m2} (ref >59)

## 2021-04-16 ENCOUNTER — Ambulatory Visit (INDEPENDENT_AMBULATORY_CARE_PROVIDER_SITE_OTHER): Payer: Medicare PPO | Admitting: Nurse Practitioner

## 2021-04-16 ENCOUNTER — Encounter: Payer: Self-pay | Admitting: Nurse Practitioner

## 2021-04-16 DIAGNOSIS — R059 Cough, unspecified: Secondary | ICD-10-CM | POA: Diagnosis not present

## 2021-04-16 MED ORDER — PREDNISONE 20 MG PO TABS: 40.0000 mg | ORAL_TABLET | Freq: Every day | ORAL | 0 refills | Status: AC

## 2021-04-16 MED ORDER — AMOXICILLIN-POT CLAVULANATE 875-125 MG PO TABS: 1.0000 | ORAL_TABLET | Freq: Two times a day (BID) | ORAL | 0 refills | Status: DC

## 2021-04-16 NOTE — Progress Notes (Signed)
Virtual Visit  Note Due to COVID-19 pandemic this visit was conducted virtually. This visit type was conducted due to national recommendations for restrictions regarding the COVID-19 Pandemic (e.g. social distancing, sheltering in place) in an effort to limit this patient's exposure and mitigate transmission in our community. All issues noted in this document were discussed and addressed.  A physical exam was not performed with this format.  I connected with Kristi Willis on 04/16/21 at 12:45 by telephone and verified that I am speaking with the correct person using two identifiers. Kristi Willis is currently located at home and her daughter is currently with her during visit. The provider, Mary-Margaret Hassell Done, FNP is located in their office at time of visit.  I discussed the limitations, risks, security and privacy concerns of performing an evaluation and management service by telephone and the availability of in person appointments. I also discussed with the patient that there may be a patient responsible charge related to this service. The patient expressed understanding and agreed to proceed.   History and Present Illness:   Chief Complaint: cough  HPI Patient cannot talk on phone so I spoke with her daughter. She has had cough fr several weeks. Has tested negative for covid. Saw he rin office on 04/09/21 and gave her some steroids which seemed to help break it up some, but she is still coughing a lot. No fever. Has sob when she gets into coughing fit. Daughter is not able to bring her into office for chest xrau at this time.    Review of Systems  Constitutional:  Negative for chills and fever.  HENT:  Positive for congestion. Negative for sore throat.   Respiratory:  Positive for cough and sputum production. Negative for shortness of breath.   Musculoskeletal: Negative.   Neurological:  Negative for headaches.  Psychiatric/Behavioral: Negative.       Observations/Objective: Did not speak with patient due  to her progressive dusphagia  Assessment and Plan: Kristi Willis in today with chief complaint of No chief complaint on file.   1. Cough 1.. Take meds as prescribed 2. Use a cool mist humidifier especially during the winter months and when heat has been humid. 3. Use saline nose sprays frequently 4. Saline irrigations of the nose can be very helpful if done frequently.  * 4X daily for 1 week*  * Use of a nettie pot can be helpful with this. Follow directions with this* 5. Drink plenty of fluids 6. Keep thermostat turn down low 7.For any cough or congestion  Use plain Mucinex- regular strength or max strength is fine   * Children- consult with Pharmacist for dosing 8. For fever or aces or pains- take tylenol or ibuprofen appropriate for age and weight.  * for fevers greater than 101 orally you may alternate ibuprofen and tylenol every  3 hours.   Meds ordered this encounter  Medications   predniSONE (DELTASONE) 20 MG tablet    Sig: Take 2 tablets (40 mg total) by mouth daily with breakfast for 5 days. 2 po daily for 5 days    Dispense:  10 tablet    Refill:  0    Order Specific Question:   Supervising Provider    Answer:   Caryl Pina A [[id number]]   amoxicillin-clavulanate (AUGMENTIN) 875-125 MG tablet    Sig: Take 1 tablet by mouth 2 (two) times daily.    Dispense:  14 tablet    Refill:  0  Order Specific Question:   Supervising Provider    Answer:   Caryl Pina A [id number]     Follow Up Instructions: prn    I discussed the assessment and treatment plan with the patient. The patient was provided an opportunity to ask questions and all were answered. The patient agreed with the plan and demonstrated an understanding of the instructions.   The patient was advised to call back or seek an in-person evaluation if the symptoms worsen or if the condition fails to improve as anticipated.  The above  assessment and management plan was discussed with the patient. The patient verbalized understanding of and has agreed to the management plan. Patient is aware to call the clinic if symptoms persist or worsen. Patient is aware when to return to the clinic for a follow-up visit. Patient educated on when it is appropriate to go to the emergency department.   Time call ended:  12:57  I provided 12 minutes of  non face-to-face time during this encounter.    Mary-Margaret Hassell Done, FNP

## 2021-04-18 ENCOUNTER — Other Ambulatory Visit: Payer: Self-pay | Admitting: Family

## 2021-04-24 ENCOUNTER — Other Ambulatory Visit: Payer: Self-pay

## 2021-04-24 ENCOUNTER — Other Ambulatory Visit: Payer: Medicare PPO

## 2021-04-24 ENCOUNTER — Encounter: Payer: Self-pay | Admitting: Family

## 2021-04-24 ENCOUNTER — Ambulatory Visit (INDEPENDENT_AMBULATORY_CARE_PROVIDER_SITE_OTHER): Payer: Medicare PPO | Admitting: Family

## 2021-04-24 VITALS — BP 114/62 | HR 68 | Temp 96.7°F | Ht 64.0 in | Wt 160.0 lb

## 2021-04-24 DIAGNOSIS — G3101 Pick's disease: Secondary | ICD-10-CM | POA: Diagnosis not present

## 2021-04-24 DIAGNOSIS — F331 Major depressive disorder, recurrent, moderate: Secondary | ICD-10-CM

## 2021-04-24 DIAGNOSIS — I10 Essential (primary) hypertension: Secondary | ICD-10-CM | POA: Diagnosis not present

## 2021-04-24 DIAGNOSIS — E785 Hyperlipidemia, unspecified: Secondary | ICD-10-CM

## 2021-04-24 DIAGNOSIS — J206 Acute bronchitis due to rhinovirus: Secondary | ICD-10-CM

## 2021-04-24 DIAGNOSIS — E039 Hypothyroidism, unspecified: Secondary | ICD-10-CM | POA: Diagnosis not present

## 2021-04-24 DIAGNOSIS — E1142 Type 2 diabetes mellitus with diabetic polyneuropathy: Secondary | ICD-10-CM | POA: Diagnosis not present

## 2021-04-24 DIAGNOSIS — J4520 Mild intermittent asthma, uncomplicated: Secondary | ICD-10-CM

## 2021-04-24 DIAGNOSIS — E1140 Type 2 diabetes mellitus with diabetic neuropathy, unspecified: Secondary | ICD-10-CM

## 2021-04-24 DIAGNOSIS — F411 Generalized anxiety disorder: Secondary | ICD-10-CM | POA: Diagnosis not present

## 2021-04-24 DIAGNOSIS — R062 Wheezing: Secondary | ICD-10-CM

## 2021-04-24 DIAGNOSIS — E1169 Type 2 diabetes mellitus with other specified complication: Secondary | ICD-10-CM | POA: Diagnosis not present

## 2021-04-24 DIAGNOSIS — Z780 Asymptomatic menopausal state: Secondary | ICD-10-CM

## 2021-04-24 DIAGNOSIS — G4700 Insomnia, unspecified: Secondary | ICD-10-CM

## 2021-04-24 DIAGNOSIS — F0280 Dementia in other diseases classified elsewhere without behavioral disturbance: Secondary | ICD-10-CM

## 2021-04-24 DIAGNOSIS — E1165 Type 2 diabetes mellitus with hyperglycemia: Secondary | ICD-10-CM

## 2021-04-24 DIAGNOSIS — IMO0002 Reserved for concepts with insufficient information to code with codable children: Secondary | ICD-10-CM

## 2021-04-24 LAB — BAYER DCA HB A1C WAIVED: HB A1C (BAYER DCA - WAIVED): 7.8 % — ABNORMAL HIGH (ref 4.8–5.6)

## 2021-04-24 MED ORDER — ROSUVASTATIN CALCIUM 20 MG PO TABS: 20.0000 mg | ORAL_TABLET | Freq: Every day | ORAL | 3 refills | Status: DC

## 2021-04-24 MED ORDER — LISINOPRIL 40 MG PO TABS: 40.0000 mg | ORAL_TABLET | Freq: Every day | ORAL | 1 refills | Status: DC

## 2021-04-24 MED ORDER — METFORMIN HCL 1000 MG PO TABS: ORAL_TABLET | ORAL | 3 refills | Status: DC

## 2021-04-24 MED ORDER — AMLODIPINE BESYLATE 5 MG PO TABS: 5.0000 mg | ORAL_TABLET | Freq: Every day | ORAL | 4 refills | Status: DC

## 2021-04-24 MED ORDER — MONTELUKAST SODIUM 10 MG PO TABS: 10.0000 mg | ORAL_TABLET | Freq: Every day | ORAL | 5 refills | Status: DC

## 2021-04-24 MED ORDER — ACETAMINOPHEN 325 MG PO TABS: 650.0000 mg | ORAL_TABLET | Freq: Four times a day (QID) | ORAL | 0 refills | Status: DC | PRN

## 2021-04-24 MED ORDER — DOXAZOSIN MESYLATE 8 MG PO TABS: ORAL_TABLET | ORAL | 2 refills | Status: DC

## 2021-04-24 MED ORDER — LEVOTHYROXINE SODIUM 75 MCG PO TABS: 75.0000 ug | ORAL_TABLET | Freq: Every day | ORAL | 3 refills | Status: DC

## 2021-04-24 MED ORDER — METOPROLOL SUCCINATE ER 100 MG PO TB24: ORAL_TABLET | ORAL | 0 refills | Status: DC

## 2021-04-24 MED ORDER — SITAGLIPTIN PHOSPHATE 50 MG PO TABS: 50.0000 mg | ORAL_TABLET | Freq: Every day | ORAL | 2 refills | Status: DC

## 2021-04-24 NOTE — Patient Instructions (Signed)
Health Maintenance After Age 80 After age 80, you are at a higher risk for certain long-term diseases and infections as well as injuries from falls. Falls are a major cause of broken bones and head injuries in people who are older than age 80. Getting regular preventive care can help to keep you healthy and well. Preventive care includes getting regular testing and making lifestyle changes as recommended by your health care provider. Talk with your health care provider about: Which screenings and tests you should have. A screening is a test that checks for a disease when you have no symptoms. A diet and exercise plan that is right for you. What should I know about screenings and tests to prevent falls? Screening and testing are the best ways to find a health problem early. Early diagnosis and treatment give you the best chance of managing medical conditions that are common after age 80. Certain conditions and lifestyle choices may make you more likely to have a fall. Your health care provider may recommend: Regular vision checks. Poor vision and conditions such as cataracts can make you more likely to have a fall. If you wear glasses, make sure to get your prescription updated if your vision changes. Medicine review. Work with your health care provider to regularly review all of the medicines you are taking, including over-the-counter medicines. Ask your health care provider about any side effects that may make you more likely to have a fall. Tell your health care provider if any medicines that you take make you feel dizzy or sleepy. Osteoporosis screening. Osteoporosis is a condition that causes the bones to get weaker. This can make the bones weak and cause them to break more easily. Blood pressure screening. Blood pressure changes and medicines to control blood pressure can make you feel dizzy. Strength and balance checks. Your health care provider may recommend certain tests to check your strength and  balance while standing, walking, or changing positions. Foot health exam. Foot pain and numbness, as well as not wearing proper footwear, can make you more likely to have a fall. Depression screening. You may be more likely to have a fall if you have a fear of falling, feel emotionally low, or feel unable to do activities that you used to do. Alcohol use screening. Using too much alcohol can affect your balance and may make you more likely to have a fall. What actions can I take to lower my risk of falls? General instructions Talk with your health care provider about your risks for falling. Tell your health care provider if: You fall. Be sure to tell your health care provider about all falls, even ones that seem minor. You feel dizzy, sleepy, or off-balance. Take over-the-counter and prescription medicines only as told by your health care provider. These include any supplements. Eat a healthy diet and maintain a healthy weight. A healthy diet includes low-fat dairy products, low-fat (lean) meats, and fiber from whole grains, beans, and lots of fruits and vegetables. Home safety Remove any tripping hazards, such as rugs, cords, and clutter. Install safety equipment such as grab bars in bathrooms and safety rails on stairs. Keep rooms and walkways well-lit. Activity  Follow a regular exercise program to stay fit. This will help you maintain your balance. Ask your health care provider what types of exercise are appropriate for you. If you need a cane or walker, use it as recommended by your health care provider. Wear supportive shoes that have nonskid soles. Lifestyle Do not   drink alcohol if your health care provider tells you not to drink. If you drink alcohol, limit how much you have: 0-1 drink a day for women. 0-2 drinks a day for men. Be aware of how much alcohol is in your drink. In the [place] one drink equals one typical bottle of beer (12 oz), one-half glass of wine (5 oz), or one shot of  hard liquor (1 oz). Do not use any products that contain nicotine or tobacco, such as cigarettes and e-cigarettes. If you need help quitting, ask your health care provider. Summary Having a healthy lifestyle and getting preventive care can help to protect your health and wellness after age 80. Screening and testing are the best way to find a health problem early and help you avoid having a fall. Early diagnosis and treatment give you the best chance for managing medical conditions that are more common for people who are older than age 80. Falls are a major cause of broken bones and head injuries in people who are older than age 80. Take precautions to prevent a fall at home. Work with your health care provider to learn what changes you can make to improve your health and wellness and to prevent falls. This information is not intended to replace advice given to you by your health care provider. Make sure you discuss any questions you have with your health care provider. Document Revised: 10/13/2020 Document Reviewed: 07/21/2020 Elsevier Patient Education  2022 [employer]  

## 2021-04-24 NOTE — Progress Notes (Signed)
Subjective:    Patient ID: Kristi Willis, female    DOB: 31-Aug-1940, 80 y.o.   MRN: [id number]  Chief Complaint  Patient presents with   Medical Management of Chronic Issues    Pt and daughter present today for chronic follow up. She has aphasia so her daughter is doing most of the talking for her today. She has an ipad that she is speaking with also.    She is followed by podiatry every 6 weeks. She is followed by Cardiologists annually.  Followed by Endo for hypothyroid and DM. She is getting allergy injections weekly.  Hypertension This is a chronic problem. The current episode started more than 1 year ago. The problem has been resolved since onset. The problem is controlled. Associated symptoms include anxiety and shortness of breath ("some times"). Pertinent negatives include no blurred vision, malaise/fatigue or peripheral edema. Risk factors for coronary artery disease include dyslipidemia and sedentary lifestyle. The current treatment provides moderate improvement. Identifiable causes of hypertension include a thyroid problem.  Asthma She complains of shortness of breath ("some times"). There is no cough or wheezing. This is a chronic problem. The current episode started more than 1 year ago. The problem occurs intermittently. Pertinent negatives include no malaise/fatigue. Her symptoms are alleviated by rest. She reports moderate improvement on treatment. Her past medical history is significant for asthma.  Diabetes She presents for her follow-up diabetic visit. She has type 2 diabetes mellitus. Hypoglycemia symptoms include nervousness/anxiousness. Associated symptoms include fatigue and foot paresthesias. Pertinent negatives for diabetes include no blurred vision. There are no hypoglycemic complications. Symptoms are stable. Risk factors for coronary artery disease include dyslipidemia, diabetes mellitus, hypertension, sedentary lifestyle and post-menopausal. She is following a  generally healthy diet. (Does not check BS at home)  Thyroid Problem Presents for follow-up visit. Symptoms include anxiety, diarrhea, dry skin and fatigue. Patient reports no depressed mood. The symptoms have been stable. Her past medical history is significant for hyperlipidemia.  Hyperlipidemia This is a chronic problem. The current episode started more than 1 year ago. The problem is controlled. Exacerbating diseases include obesity. Associated symptoms include shortness of breath ("some times"). Current antihyperlipidemic treatment includes statins. The current treatment provides moderate improvement of lipids. Risk factors for coronary artery disease include dyslipidemia, diabetes mellitus, hypertension, a sedentary lifestyle and post-menopausal.  Arthritis Presents for follow-up visit. She complains of pain and stiffness. The symptoms have been stable. Affected locations include the left knee, right knee, right MCP and left MCP. Her pain is at a severity of 0/10. Associated symptoms include diarrhea and fatigue.  Insomnia Primary symptoms: difficulty falling asleep, frequent awakening, no malaise/fatigue.   The current episode started more than one year. The onset quality is gradual. The problem occurs intermittently. PMH includes: depression.   Depression        This is a chronic problem.  The current episode started more than 1 year ago.   The problem occurs intermittently.  Associated symptoms include fatigue, insomnia and decreased interest.  Associated symptoms include no helplessness, no hopelessness and not irritable.  Past treatments include SSRIs - Selective serotonin reuptake inhibitors.  Past medical history includes thyroid problem and anxiety.   Anxiety Presents for follow-up visit. Symptoms include excessive worry, insomnia, irritability, nervous/anxious behavior and shortness of breath ("some times"). Patient reports no depressed mood.   Her past medical history is significant  for asthma.     Review of Systems  Constitutional:  Positive for fatigue and irritability. Negative  for malaise/fatigue.  Eyes:  Negative for blurred vision.  Respiratory:  Positive for shortness of breath ("some times"). Negative for cough and wheezing.   Gastrointestinal:  Positive for diarrhea.  Musculoskeletal:  Positive for arthritis and stiffness.  Psychiatric/Behavioral:  Positive for depression. The patient is nervous/anxious and has insomnia.   All other systems reviewed and are negative.     Objective:   Physical Exam Vitals reviewed.  Constitutional:      General: She is not irritable.She is not in acute distress.    Appearance: She is well-developed.  HENT:     Head: Normocephalic and atraumatic.     Right Ear: Tympanic membrane normal.     Left Ear: Tympanic membrane normal.  Eyes:     Pupils: Pupils are equal, round, and reactive to light.  Neck:     Thyroid: No thyromegaly.  Cardiovascular:     Rate and Rhythm: Normal rate and regular rhythm.     Heart sounds: Normal heart sounds. No murmur heard. Pulmonary:     Effort: Pulmonary effort is normal. No respiratory distress.     Breath sounds: Normal breath sounds. No wheezing.  Abdominal:     General: Bowel sounds are normal. There is no distension.     Palpations: Abdomen is soft.     Tenderness: There is no abdominal tenderness.  Musculoskeletal:        General: No tenderness. Normal range of motion.     Cervical back: Normal range of motion and neck supple.  Skin:    General: Skin is warm and dry.  Neurological:     Mental Status: She is alert and oriented to person, place, and time.     Cranial Nerves: No cranial nerve deficit.     Deep Tendon Reflexes: Reflexes are normal and symmetric.  Psychiatric:        Speech: She is noncommunicative.        Behavior: Behavior normal.        Thought Content: Thought content normal.        Judgment: Judgment normal.     Diabetic Foot Exam - Simple   Simple  Foot Form Diabetic Foot exam was performed with the following findings: Yes 04/24/2021  3:50 PM  Visual Inspection No deformities, no ulcerations, no other skin breakdown bilaterally: Yes Sensation Testing See comments: Yes Pulse Check Posterior Tibialis and Dorsalis pulse intact bilaterally: Yes Comments Monofilament negative on bilateral feet     BP 114/62   Pulse 68   Temp (!) 96.7 F (35.9 C) (Temporal)   Ht _0  (1.626 m)   Wt 160 lb (72.6 kg)   BMI 27.46 kg/m      Assessment & Plan:  ZABDI MIS comes in today with chief complaint of Medical Management of Chronic Issues   Diagnosis and orders addressed:  1. Essential hypertension - CMP14+EGFR - CBC with Differential/Platelet  2. Mild intermittent asthma without complication - XFG18+EXHB - CBC with Differential/Platelet  3. Hypothyroidism, unspecified type - CMP14+EGFR - CBC with Differential/Platelet - TSH  4. Diabetic polyneuropathy associated with type 2 diabetes mellitus ([place]) - Bayer DCA Hb A1c Waived - CMP14+EGFR - CBC with Differential/Platelet  5. Uncontrolled type 2 diabetes mellitus with diabetic neuropathy, without long-term current use of insulin ([facility]) - CMP14+EGFR - CBC with Differential/Platelet  6. Hyperlipidemia associated with type 2 diabetes mellitus ([facility]) - CMP14+EGFR - CBC with Differential/Platelet - Lipid panel  7. Primary progressive aphasia ([place]) - CMP14+EGFR - CBC with Differential/Platelet  8. GAD (generalized anxiety disorder) - CMP14+EGFR - CBC with Differential/Platelet  9. Moderate episode of recurrent major depressive disorder (HCC) - CMP14+EGFR - CBC with Differential/Platelet  10. Insomnia, unspecified type - CMP14+EGFR - CBC with Differential/Platelet  11. Acute bronchitis due to Rhinovirus - montelukast (SINGULAIR) 10 MG tablet; Take 1 tablet (10 mg total) by mouth at bedtime.  Dispense: 30 tablet; Refill: 5 - CMP14+EGFR - CBC with  Differential/Platelet  12. Wheezing - CMP14+EGFR - CBC with Differential/Platelet  13. Post-menopause - DG [facility] DEXA   Labs pending Health Maintenance reviewed Diet and exercise encouraged  Follow up plan: 3 months    Evelina Dun, FNP

## 2021-04-25 DIAGNOSIS — J3081 Allergic rhinitis due to animal (cat) (dog) hair and dander: Secondary | ICD-10-CM | POA: Diagnosis not present

## 2021-04-25 DIAGNOSIS — J3089 Other allergic rhinitis: Secondary | ICD-10-CM | POA: Diagnosis not present

## 2021-04-25 DIAGNOSIS — J301 Allergic rhinitis due to pollen: Secondary | ICD-10-CM | POA: Diagnosis not present

## 2021-04-25 LAB — CMP14+EGFR
ALT: 25 IU/L (ref 0–32)
AST: 23 IU/L (ref 0–40)
Albumin/Globulin Ratio: 2.1 (ref 1.2–2.2)
Albumin: 3.7 g/dL (ref 3.7–4.7)
Alkaline Phosphatase: 54 IU/L (ref 44–121)
BUN/Creatinine Ratio: 24 (ref 12–28)
BUN: 18 mg/dL (ref 8–27)
Bilirubin Total: 0.2 mg/dL (ref 0.0–1.2)
CO2: 22 mmol/L (ref 20–29)
Calcium: 9.4 mg/dL (ref 8.7–10.3)
Chloride: 98 mmol/L (ref 96–106)
Creatinine, Ser: 0.76 mg/dL (ref 0.57–1.00)
Globulin, Total: 1.8 g/dL (ref 1.5–4.5)
Glucose: 297 mg/dL — ABNORMAL HIGH (ref 65–99)
Potassium: 4 mmol/L (ref 3.5–5.2)
Sodium: 137 mmol/L (ref 134–144)
Total Protein: 5.5 g/dL — ABNORMAL LOW (ref 6.0–8.5)
eGFR: 80 mL/min/{1.73_m2} (ref >59)

## 2021-04-25 LAB — CBC WITH DIFFERENTIAL/PLATELET
Basophils Absolute: 0 10*3/uL (ref 0.0–0.2)
Basos: 0 %
EOS (ABSOLUTE): 0.2 10*3/uL (ref 0.0–0.4)
Eos: 1 %
Hematocrit: 34 % (ref 34.0–46.6)
Hemoglobin: 10.8 g/dL — ABNORMAL LOW (ref 11.1–15.9)
Immature Grans (Abs): 0.1 10*3/uL (ref 0.0–0.1)
Immature Granulocytes: 1 %
Lymphocytes Absolute: 3.2 10*3/uL — ABNORMAL HIGH (ref 0.7–3.1)
Lymphs: 27 %
MCH: 27.3 pg (ref 26.6–33.0)
MCHC: 31.8 g/dL (ref 31.5–35.7)
MCV: 86 fL (ref 79–97)
Monocytes Absolute: 1 10*3/uL — ABNORMAL HIGH (ref 0.1–0.9)
Monocytes: 9 %
Neutrophils Absolute: 7.3 10*3/uL — ABNORMAL HIGH (ref 1.4–7.0)
Neutrophils: 62 %
Platelets: 224 10*3/uL (ref 150–450)
RBC: 3.96 x10E6/uL (ref 3.77–5.28)
RDW: 13.8 % (ref 11.7–15.4)
WBC: 11.8 10*3/uL — ABNORMAL HIGH (ref 3.4–10.8)

## 2021-04-25 LAB — LIPID PANEL
Chol/HDL Ratio: 3.3 ratio (ref 0.0–4.4)
Cholesterol, Total: 126 mg/dL (ref 100–199)
HDL: 38 mg/dL — ABNORMAL LOW (ref >39)
LDL Chol Calc (NIH): 43 mg/dL (ref 0–99)
Triglycerides: 294 mg/dL — ABNORMAL HIGH (ref 0–149)
VLDL Cholesterol Cal: 45 mg/dL — ABNORMAL HIGH (ref 5–40)

## 2021-04-25 LAB — TSH: TSH: 0.772 u[IU]/mL (ref 0.450–4.500)

## 2021-04-27 ENCOUNTER — Ambulatory Visit (INDEPENDENT_AMBULATORY_CARE_PROVIDER_SITE_OTHER): Payer: Medicare PPO

## 2021-04-27 DIAGNOSIS — Z780 Asymptomatic menopausal state: Secondary | ICD-10-CM

## 2021-05-02 DIAGNOSIS — J3089 Other allergic rhinitis: Secondary | ICD-10-CM | POA: Diagnosis not present

## 2021-05-02 DIAGNOSIS — J3081 Allergic rhinitis due to animal (cat) (dog) hair and dander: Secondary | ICD-10-CM | POA: Diagnosis not present

## 2021-05-02 DIAGNOSIS — J301 Allergic rhinitis due to pollen: Secondary | ICD-10-CM | POA: Diagnosis not present

## 2021-05-08 ENCOUNTER — Other Ambulatory Visit: Payer: Self-pay | Admitting: Family

## 2021-05-08 ENCOUNTER — Ambulatory Visit: Payer: Medicare PPO | Admitting: Family

## 2021-05-08 MED ORDER — KETOCONAZOLE 2 % EX CREA: 1.0000 "application " | TOPICAL_CREAM | Freq: Every day | CUTANEOUS | 0 refills | Status: DC

## 2021-05-11 DIAGNOSIS — J3089 Other allergic rhinitis: Secondary | ICD-10-CM | POA: Diagnosis not present

## 2021-05-11 DIAGNOSIS — J3081 Allergic rhinitis due to animal (cat) (dog) hair and dander: Secondary | ICD-10-CM | POA: Diagnosis not present

## 2021-05-11 DIAGNOSIS — J301 Allergic rhinitis due to pollen: Secondary | ICD-10-CM | POA: Diagnosis not present

## 2021-05-16 DIAGNOSIS — H18593 Other hereditary corneal dystrophies, bilateral: Secondary | ICD-10-CM | POA: Diagnosis not present

## 2021-05-16 DIAGNOSIS — Z961 Presence of intraocular lens: Secondary | ICD-10-CM | POA: Diagnosis not present

## 2021-05-16 DIAGNOSIS — J3081 Allergic rhinitis due to animal (cat) (dog) hair and dander: Secondary | ICD-10-CM | POA: Diagnosis not present

## 2021-05-16 DIAGNOSIS — J301 Allergic rhinitis due to pollen: Secondary | ICD-10-CM | POA: Diagnosis not present

## 2021-05-16 DIAGNOSIS — E119 Type 2 diabetes mellitus without complications: Secondary | ICD-10-CM | POA: Diagnosis not present

## 2021-05-16 DIAGNOSIS — J3089 Other allergic rhinitis: Secondary | ICD-10-CM | POA: Diagnosis not present

## 2021-05-16 DIAGNOSIS — H35363 Drusen (degenerative) of macula, bilateral: Secondary | ICD-10-CM | POA: Diagnosis not present

## 2021-05-16 LAB — HM DIABETES EYE EXAM

## 2021-05-24 DIAGNOSIS — J301 Allergic rhinitis due to pollen: Secondary | ICD-10-CM | POA: Diagnosis not present

## 2021-05-24 DIAGNOSIS — J3081 Allergic rhinitis due to animal (cat) (dog) hair and dander: Secondary | ICD-10-CM | POA: Diagnosis not present

## 2021-05-24 DIAGNOSIS — J3089 Other allergic rhinitis: Secondary | ICD-10-CM | POA: Diagnosis not present

## 2021-05-28 DIAGNOSIS — H18593 Other hereditary corneal dystrophies, bilateral: Secondary | ICD-10-CM | POA: Diagnosis not present

## 2021-05-28 DIAGNOSIS — J301 Allergic rhinitis due to pollen: Secondary | ICD-10-CM | POA: Diagnosis not present

## 2021-05-28 DIAGNOSIS — J3089 Other allergic rhinitis: Secondary | ICD-10-CM | POA: Diagnosis not present

## 2021-05-28 DIAGNOSIS — J3081 Allergic rhinitis due to animal (cat) (dog) hair and dander: Secondary | ICD-10-CM | POA: Diagnosis not present

## 2021-05-28 LAB — HM DIABETES EYE EXAM

## 2021-06-06 DIAGNOSIS — J3089 Other allergic rhinitis: Secondary | ICD-10-CM | POA: Diagnosis not present

## 2021-06-06 DIAGNOSIS — J3081 Allergic rhinitis due to animal (cat) (dog) hair and dander: Secondary | ICD-10-CM | POA: Diagnosis not present

## 2021-06-06 DIAGNOSIS — J301 Allergic rhinitis due to pollen: Secondary | ICD-10-CM | POA: Diagnosis not present

## 2021-06-07 DIAGNOSIS — M79676 Pain in unspecified toe(s): Secondary | ICD-10-CM | POA: Diagnosis not present

## 2021-06-07 DIAGNOSIS — B351 Tinea unguium: Secondary | ICD-10-CM | POA: Diagnosis not present

## 2021-06-07 DIAGNOSIS — E1142 Type 2 diabetes mellitus with diabetic polyneuropathy: Secondary | ICD-10-CM | POA: Diagnosis not present

## 2021-06-07 DIAGNOSIS — L84 Corns and callosities: Secondary | ICD-10-CM | POA: Diagnosis not present

## 2021-06-13 DIAGNOSIS — J301 Allergic rhinitis due to pollen: Secondary | ICD-10-CM | POA: Diagnosis not present

## 2021-06-13 DIAGNOSIS — J3081 Allergic rhinitis due to animal (cat) (dog) hair and dander: Secondary | ICD-10-CM | POA: Diagnosis not present

## 2021-06-13 DIAGNOSIS — J3089 Other allergic rhinitis: Secondary | ICD-10-CM | POA: Diagnosis not present

## 2021-06-20 DIAGNOSIS — J301 Allergic rhinitis due to pollen: Secondary | ICD-10-CM | POA: Diagnosis not present

## 2021-06-20 DIAGNOSIS — J3081 Allergic rhinitis due to animal (cat) (dog) hair and dander: Secondary | ICD-10-CM | POA: Diagnosis not present

## 2021-06-20 DIAGNOSIS — J3089 Other allergic rhinitis: Secondary | ICD-10-CM | POA: Diagnosis not present

## 2021-06-27 DIAGNOSIS — J3081 Allergic rhinitis due to animal (cat) (dog) hair and dander: Secondary | ICD-10-CM | POA: Diagnosis not present

## 2021-06-27 DIAGNOSIS — J301 Allergic rhinitis due to pollen: Secondary | ICD-10-CM | POA: Diagnosis not present

## 2021-06-27 DIAGNOSIS — J3089 Other allergic rhinitis: Secondary | ICD-10-CM | POA: Diagnosis not present

## 2021-07-06 ENCOUNTER — Telehealth: Payer: Self-pay | Admitting: Family

## 2021-07-06 DIAGNOSIS — J3081 Allergic rhinitis due to animal (cat) (dog) hair and dander: Secondary | ICD-10-CM | POA: Diagnosis not present

## 2021-07-06 DIAGNOSIS — J3089 Other allergic rhinitis: Secondary | ICD-10-CM | POA: Diagnosis not present

## 2021-07-06 DIAGNOSIS — J301 Allergic rhinitis due to pollen: Secondary | ICD-10-CM | POA: Diagnosis not present

## 2021-07-06 NOTE — Telephone Encounter (Signed)
Aware - completed PNEUMO vaccinces

## 2021-07-11 DIAGNOSIS — J3081 Allergic rhinitis due to animal (cat) (dog) hair and dander: Secondary | ICD-10-CM | POA: Diagnosis not present

## 2021-07-11 DIAGNOSIS — J301 Allergic rhinitis due to pollen: Secondary | ICD-10-CM | POA: Diagnosis not present

## 2021-07-11 DIAGNOSIS — J3089 Other allergic rhinitis: Secondary | ICD-10-CM | POA: Diagnosis not present

## 2021-07-19 DIAGNOSIS — J3089 Other allergic rhinitis: Secondary | ICD-10-CM | POA: Diagnosis not present

## 2021-07-19 DIAGNOSIS — J3081 Allergic rhinitis due to animal (cat) (dog) hair and dander: Secondary | ICD-10-CM | POA: Diagnosis not present

## 2021-07-19 DIAGNOSIS — J301 Allergic rhinitis due to pollen: Secondary | ICD-10-CM | POA: Diagnosis not present

## 2021-07-27 DIAGNOSIS — J3081 Allergic rhinitis due to animal (cat) (dog) hair and dander: Secondary | ICD-10-CM | POA: Diagnosis not present

## 2021-07-27 DIAGNOSIS — J301 Allergic rhinitis due to pollen: Secondary | ICD-10-CM | POA: Diagnosis not present

## 2021-07-27 DIAGNOSIS — J3089 Other allergic rhinitis: Secondary | ICD-10-CM | POA: Diagnosis not present

## 2021-08-02 DIAGNOSIS — J3089 Other allergic rhinitis: Secondary | ICD-10-CM | POA: Diagnosis not present

## 2021-08-02 DIAGNOSIS — J3081 Allergic rhinitis due to animal (cat) (dog) hair and dander: Secondary | ICD-10-CM | POA: Diagnosis not present

## 2021-08-02 DIAGNOSIS — J301 Allergic rhinitis due to pollen: Secondary | ICD-10-CM | POA: Diagnosis not present

## 2021-08-08 DIAGNOSIS — J3089 Other allergic rhinitis: Secondary | ICD-10-CM | POA: Diagnosis not present

## 2021-08-08 DIAGNOSIS — J301 Allergic rhinitis due to pollen: Secondary | ICD-10-CM | POA: Diagnosis not present

## 2021-08-08 DIAGNOSIS — J3081 Allergic rhinitis due to animal (cat) (dog) hair and dander: Secondary | ICD-10-CM | POA: Diagnosis not present

## 2021-08-16 DIAGNOSIS — M79676 Pain in unspecified toe(s): Secondary | ICD-10-CM | POA: Diagnosis not present

## 2021-08-16 DIAGNOSIS — B351 Tinea unguium: Secondary | ICD-10-CM | POA: Diagnosis not present

## 2021-08-16 DIAGNOSIS — L84 Corns and callosities: Secondary | ICD-10-CM | POA: Diagnosis not present

## 2021-08-16 DIAGNOSIS — E1142 Type 2 diabetes mellitus with diabetic polyneuropathy: Secondary | ICD-10-CM | POA: Diagnosis not present

## 2021-08-17 DIAGNOSIS — J3089 Other allergic rhinitis: Secondary | ICD-10-CM | POA: Diagnosis not present

## 2021-08-17 DIAGNOSIS — J3081 Allergic rhinitis due to animal (cat) (dog) hair and dander: Secondary | ICD-10-CM | POA: Diagnosis not present

## 2021-08-17 DIAGNOSIS — J301 Allergic rhinitis due to pollen: Secondary | ICD-10-CM | POA: Diagnosis not present

## 2021-08-21 DIAGNOSIS — J3089 Other allergic rhinitis: Secondary | ICD-10-CM | POA: Diagnosis not present

## 2021-08-21 DIAGNOSIS — J3081 Allergic rhinitis due to animal (cat) (dog) hair and dander: Secondary | ICD-10-CM | POA: Diagnosis not present

## 2021-08-21 DIAGNOSIS — R059 Cough, unspecified: Secondary | ICD-10-CM | POA: Diagnosis not present

## 2021-08-21 DIAGNOSIS — J301 Allergic rhinitis due to pollen: Secondary | ICD-10-CM | POA: Diagnosis not present

## 2021-08-22 DIAGNOSIS — E039 Hypothyroidism, unspecified: Secondary | ICD-10-CM | POA: Diagnosis not present

## 2021-08-22 DIAGNOSIS — K5900 Constipation, unspecified: Secondary | ICD-10-CM | POA: Diagnosis not present

## 2021-08-22 DIAGNOSIS — I10 Essential (primary) hypertension: Secondary | ICD-10-CM | POA: Diagnosis not present

## 2021-08-22 DIAGNOSIS — F3341 Major depressive disorder, recurrent, in partial remission: Secondary | ICD-10-CM | POA: Diagnosis not present

## 2021-08-22 DIAGNOSIS — I471 Supraventricular tachycardia: Secondary | ICD-10-CM | POA: Diagnosis not present

## 2021-08-22 DIAGNOSIS — F411 Generalized anxiety disorder: Secondary | ICD-10-CM | POA: Diagnosis not present

## 2021-08-22 DIAGNOSIS — E1142 Type 2 diabetes mellitus with diabetic polyneuropathy: Secondary | ICD-10-CM | POA: Diagnosis not present

## 2021-08-22 DIAGNOSIS — E785 Hyperlipidemia, unspecified: Secondary | ICD-10-CM | POA: Diagnosis not present

## 2021-08-22 DIAGNOSIS — J45909 Unspecified asthma, uncomplicated: Secondary | ICD-10-CM | POA: Diagnosis not present

## 2021-08-29 DIAGNOSIS — H18513 Endothelial corneal dystrophy, bilateral: Secondary | ICD-10-CM | POA: Diagnosis not present

## 2021-08-29 DIAGNOSIS — J301 Allergic rhinitis due to pollen: Secondary | ICD-10-CM | POA: Diagnosis not present

## 2021-08-29 DIAGNOSIS — H18523 Epithelial (juvenile) corneal dystrophy, bilateral: Secondary | ICD-10-CM | POA: Diagnosis not present

## 2021-08-29 DIAGNOSIS — Z961 Presence of intraocular lens: Secondary | ICD-10-CM | POA: Diagnosis not present

## 2021-08-29 DIAGNOSIS — J3089 Other allergic rhinitis: Secondary | ICD-10-CM | POA: Diagnosis not present

## 2021-08-29 DIAGNOSIS — J3081 Allergic rhinitis due to animal (cat) (dog) hair and dander: Secondary | ICD-10-CM | POA: Diagnosis not present

## 2021-08-31 ENCOUNTER — Other Ambulatory Visit: Payer: Self-pay | Admitting: Family

## 2021-09-06 ENCOUNTER — Other Ambulatory Visit (HOSPITAL_COMMUNITY): Payer: Self-pay | Admitting: Family

## 2021-09-06 DIAGNOSIS — Z1231 Encounter for screening mammogram for malignant neoplasm of breast: Secondary | ICD-10-CM

## 2021-09-07 DIAGNOSIS — J3081 Allergic rhinitis due to animal (cat) (dog) hair and dander: Secondary | ICD-10-CM | POA: Diagnosis not present

## 2021-09-07 DIAGNOSIS — J301 Allergic rhinitis due to pollen: Secondary | ICD-10-CM | POA: Diagnosis not present

## 2021-09-07 DIAGNOSIS — J3089 Other allergic rhinitis: Secondary | ICD-10-CM | POA: Diagnosis not present

## 2021-09-13 ENCOUNTER — Ambulatory Visit: Payer: Medicare PPO | Admitting: Family Medicine

## 2021-09-13 ENCOUNTER — Other Ambulatory Visit: Payer: Self-pay

## 2021-09-13 ENCOUNTER — Encounter: Payer: Self-pay | Admitting: Family Medicine

## 2021-09-13 VITALS — BP 144/51 | HR 67 | Ht 64.0 in | Wt 160.0 lb

## 2021-09-13 DIAGNOSIS — L03116 Cellulitis of left lower limb: Secondary | ICD-10-CM | POA: Diagnosis not present

## 2021-09-13 DIAGNOSIS — J3089 Other allergic rhinitis: Secondary | ICD-10-CM | POA: Diagnosis not present

## 2021-09-13 DIAGNOSIS — J301 Allergic rhinitis due to pollen: Secondary | ICD-10-CM | POA: Diagnosis not present

## 2021-09-13 DIAGNOSIS — L03032 Cellulitis of left toe: Secondary | ICD-10-CM

## 2021-09-13 DIAGNOSIS — J3081 Allergic rhinitis due to animal (cat) (dog) hair and dander: Secondary | ICD-10-CM | POA: Diagnosis not present

## 2021-09-13 MED ORDER — DOXYCYCLINE HYCLATE 100 MG PO TABS: 100.0000 mg | ORAL_TABLET | Freq: Two times a day (BID) | ORAL | 0 refills | Status: DC

## 2021-09-13 NOTE — Progress Notes (Signed)
BP (!) 144/51    Pulse 67    Ht _0  (1.626 m)    Wt 160 lb (72.6 kg)    SpO2 95%    BMI 27.46 kg/m    Subjective:   Patient ID: Kristi Willis, female    DOB: 04-08-1941, 81 y.o.   MRN: [id number]  HPI: Kristi Willis is a 81 y.o. female presenting on 09/13/2021 for Redness  (LLE, present for 1 week, Left 2nd toe red and swollen)   HPI Patient has redness and swelling in the left second toe and left lower extremity.  Per her daughter who is here with her today says that its been going on for about a week but she just noticed it yesterday.  She says it feels red and warm and swollen on that left leg.  She does not normally look like that, normally looks like the right leg.  She also has a small healing cut on the end of her left second toe and swelling in the left second toe as well and she was concerned about cellulitis as well.  She does see a podiatrist Dr. Irving Shows but did not know if this came from when she was seeing him or not.  She is numb in both of her legs and does not feel either one of them so she has not noticed any pain.  She denies any fevers or chills or redness or warmth anywhere else.  Relevant past medical, surgical, family and social history reviewed and updated as indicated. Interim medical history since our last visit reviewed. Allergies and medications reviewed and updated.  Review of Systems  Constitutional:  Negative for chills and fever.  Eyes:  Negative for visual disturbance.  Respiratory:  Negative for chest tightness and shortness of breath.   Cardiovascular:  Negative for chest pain and leg swelling.  Skin:  Positive for color change (redness and warmth). Negative for rash.  Neurological:  Negative for light-headedness and headaches.  Psychiatric/Behavioral:  Negative for agitation and behavioral problems.   All other systems reviewed and are negative.  Per HPI unless specifically indicated above   Allergies as of 09/13/2021       Reactions    Dimetapp C [phenylephrine-bromphen-codeine] Itching   Phenylephrine Hcl Itching   Ultram [tramadol Hcl] Other (See Comments)   Insomnia   Celebrex [celecoxib] Rash   Cortisone Rash   Fenofibrate Rash   Sulfa Antibiotics Rash        Medication List        Accurate as of September 13, 2021  2:49 PM. If you have any questions, ask your nurse or doctor.          Accu-Chek Aviva Plus test strip Generic drug: glucose blood CHECK BLOOD SUGAR ONCE DAILY OR AS DIRECTED Dx E11.40   Accu-Chek Aviva Plus w/Device Kit Check sugars daily & as needed Dx E11.9   Accu-Chek Softclix Lancets lancets CHECK BLOOD SUGAR ONCE DAILY OR AS DIRECTED Dx E11.40   acetaminophen 325 MG tablet Commonly known as: TYLENOL Take 2 tablets (650 mg total) by mouth every 6 (six) hours as needed for mild pain or fever (or Fever >/= 101).   acyclovir ointment 5 % Commonly known as: ZOVIRAX APPLY TO THE AFFECTED AREA AROUND MOUTH EVERY 3 HOURS AS DIRECTED UNTIL CLEAR   albuterol 108 (90 Base) MCG/ACT inhaler Commonly known as: Ventolin HFA Inhale 2 puffs into the lungs every 6 (six) hours as needed for wheezing or shortness of  breath.   albuterol (2.5 MG/3ML) 0.083% nebulizer solution Commonly known as: PROVENTIL Take 3 mLs (2.5 mg total) by nebulization every 6 (six) hours as needed for wheezing or shortness of breath.   amLODipine 5 MG tablet Commonly known as: NORVASC Take 1 tablet (5 mg total) by mouth daily.   aspirin 81 MG chewable tablet Chew 81 mg by mouth at bedtime.   azelastine 0.1 % nasal spray Commonly known as: ASTELIN Place 2 sprays into both nostrils 2 (two) times daily.   Cheratussin AC 100-10 MG/5ML syrup Generic drug: guaiFENesin-codeine Take 5 mLs by mouth every 4 (four) hours as needed for cough.   COQ10 PO Take 1 tablet by mouth daily.   doxazosin 8 MG tablet Commonly known as: CARDURA TAKE (1) TABLET DAILY FOR HIGH BLOOD PRESSURE.   doxycycline 100 MG  tablet Commonly known as: VIBRA-TABS Take 1 tablet (100 mg total) by mouth 2 (two) times daily. 1 po bid Started by: Fransisca Kaufmann Jamis Kryder, MD   EpiPen 2-Pak 0.3 mg/0.3 mL Soaj injection Generic drug: EPINEPHrine Inject 0.3 mg into the muscle as needed for anaphylaxis. Reported on 01/24/2016   escitalopram 20 MG tablet Commonly known as: LEXAPRO Take 1 tablet (20 mg total) by mouth daily. (NEEDS TO BE SEEN BEFORE NEXT REFILL)   ipratropium-albuterol 0.5-2.5 (3) MG/3ML Soln Commonly known as: DUONEB Take 3 mLs by nebulization 2 (two) times daily.   ketoconazole 2 % cream Commonly known as: NIZORAL Apply 1 application topically daily.   levothyroxine 75 MCG tablet Commonly known as: SYNTHROID Take 1 tablet (75 mcg total) by mouth daily before breakfast.   lisinopril 40 MG tablet Commonly known as: ZESTRIL Take 1 tablet (40 mg total) by mouth daily.   loratadine 10 MG tablet Commonly known as: CLARITIN Take 1 tablet (10 mg total) by mouth daily.   metFORMIN 1000 MG tablet Commonly known as: GLUCOPHAGE TAKE  (1)  TABLET TWICE A DAY WITH MEALS (BREAKFAST AND SUPPER)   metoprolol succinate 100 MG 24 hr tablet Commonly known as: TOPROL-XL TAKE (1) TABLET DAILY   montelukast 10 MG tablet Commonly known as: SINGULAIR Take 1 tablet (10 mg total) by mouth at bedtime.   rosuvastatin 20 MG tablet Commonly known as: Crestor Take 1 tablet (20 mg total) by mouth at bedtime.   sitaGLIPtin 50 MG tablet Commonly known as: JANUVIA Take 1 tablet (50 mg total) by mouth daily.         Objective:   BP (!) 144/51    Pulse 67    Ht _0  (1.626 m)    Wt 160 lb (72.6 kg)    SpO2 95%    BMI 27.46 kg/m   Wt Readings from Last 3 Encounters:  09/13/21 160 lb (72.6 kg)  04/24/21 160 lb (72.6 kg)  03/05/21 160 lb (72.6 kg)    Physical Exam Vitals and nursing note reviewed.  Constitutional:      General: She is not in acute distress.    Appearance: She is well-developed. She is not  diaphoretic.  Eyes:     Conjunctiva/sclera: Conjunctivae normal.  Musculoskeletal:        General: No tenderness. Normal range of motion.  Skin:    General: Skin is warm and dry.     Findings: Erythema (Left leg erythema and warmth and a small patch on her anterior lower leg and erythema and warmth and swelling on her left second toe, neither is tender because she does not have sensation.) present. No  rash.  Neurological:     Mental Status: She is alert and oriented to person, place, and time.     Coordination: Coordination normal.  Psychiatric:        Behavior: Behavior normal.      Assessment & Plan:   Problem List Items Addressed This Visit   None Visit Diagnoses     Cellulitis of left leg    -  Primary   Relevant Medications   doxycycline (VIBRA-TABS) 100 MG tablet   Cellulitis of left toe       Relevant Medications   doxycycline (VIBRA-TABS) 100 MG tablet     Patient cellulitis of left leg secondary to help, she does follow podiatry and recommended that they go back to the ER, will do antibiotic but if not improved and the need to return or go to the podiatry.  Follow up plan: Return if symptoms worsen or fail to improve.  Counseling provided for all of the vaccine components No orders of the defined types were placed in this encounter.   Caryl Pina, MD [facility] 09/13/2021, 2:49 PM

## 2021-09-18 DIAGNOSIS — R601 Generalized edema: Secondary | ICD-10-CM | POA: Diagnosis not present

## 2021-09-18 DIAGNOSIS — L03122 Acute lymphangitis of left axilla: Secondary | ICD-10-CM | POA: Diagnosis not present

## 2021-09-21 DIAGNOSIS — J301 Allergic rhinitis due to pollen: Secondary | ICD-10-CM | POA: Diagnosis not present

## 2021-09-21 DIAGNOSIS — J3089 Other allergic rhinitis: Secondary | ICD-10-CM | POA: Diagnosis not present

## 2021-09-21 DIAGNOSIS — J3081 Allergic rhinitis due to animal (cat) (dog) hair and dander: Secondary | ICD-10-CM | POA: Diagnosis not present

## 2021-09-26 ENCOUNTER — Encounter (HOSPITAL_BASED_OUTPATIENT_CLINIC_OR_DEPARTMENT_OTHER): Payer: Self-pay

## 2021-09-26 ENCOUNTER — Emergency Department (HOSPITAL_BASED_OUTPATIENT_CLINIC_OR_DEPARTMENT_OTHER): Payer: Medicare PPO

## 2021-09-26 ENCOUNTER — Other Ambulatory Visit: Payer: Self-pay

## 2021-09-26 ENCOUNTER — Emergency Department (HOSPITAL_BASED_OUTPATIENT_CLINIC_OR_DEPARTMENT_OTHER)
Admission: EM | Admit: 2021-09-26 | Discharge: 2021-09-26 | Disposition: A | Discharge status: Home / Self Care | Payer: Medicare PPO | Attending: Emergency Medicine | Admitting: Emergency Medicine

## 2021-09-26 DIAGNOSIS — H8110 Benign paroxysmal vertigo, unspecified ear: Secondary | ICD-10-CM

## 2021-09-26 DIAGNOSIS — R42 Dizziness and giddiness: Secondary | ICD-10-CM | POA: Diagnosis not present

## 2021-09-26 DIAGNOSIS — R251 Tremor, unspecified: Secondary | ICD-10-CM | POA: Diagnosis not present

## 2021-09-26 DIAGNOSIS — Z7982 Long term (current) use of aspirin: Secondary | ICD-10-CM | POA: Diagnosis not present

## 2021-09-26 DIAGNOSIS — I10 Essential (primary) hypertension: Secondary | ICD-10-CM | POA: Diagnosis not present

## 2021-09-26 LAB — CBC
HCT: 31 % — ABNORMAL LOW (ref 36.0–46.0)
Hemoglobin: 9.7 g/dL — ABNORMAL LOW (ref 12.0–15.0)
MCH: 26.4 pg (ref 26.0–34.0)
MCHC: 31.3 g/dL (ref 30.0–36.0)
MCV: 84.2 fL (ref 80.0–100.0)
Platelets: 221 10*3/uL (ref 150–400)
RBC: 3.68 MIL/uL — ABNORMAL LOW (ref 3.87–5.11)
RDW: 13.5 % (ref 11.5–15.5)
WBC: 7.4 10*3/uL (ref 4.0–10.5)
nRBC: 0 % (ref 0.0–0.2)

## 2021-09-26 LAB — BASIC METABOLIC PANEL
Anion gap: 11 (ref 5–15)
BUN: 11 mg/dL (ref 8–23)
CO2: 27 mmol/L (ref 22–32)
Calcium: 9.4 mg/dL (ref 8.9–10.3)
Chloride: 100 mmol/L (ref 98–111)
Creatinine, Ser: 0.56 mg/dL (ref 0.44–1.00)
GFR, Estimated: >60 mL/min (ref >60)
Glucose, Bld: 107 mg/dL — ABNORMAL HIGH (ref 70–99)
Potassium: 3.9 mmol/L (ref 3.5–5.1)
Sodium: 138 mmol/L (ref 135–145)

## 2021-09-26 LAB — URINALYSIS, ROUTINE W REFLEX MICROSCOPIC
Bilirubin Urine: NEGATIVE
Glucose, UA: NEGATIVE mg/dL
Hgb urine dipstick: NEGATIVE
Ketones, ur: NEGATIVE mg/dL
Nitrite: NEGATIVE
Specific Gravity, Urine: 1.019 (ref 1.005–1.030)
pH: 7 (ref 5.0–8.0)

## 2021-09-26 LAB — CBG MONITORING, ED: Glucose-Capillary: 102 mg/dL — ABNORMAL HIGH (ref 70–99)

## 2021-09-26 MED ORDER — MECLIZINE HCL 25 MG PO TABS
25.0000 mg | ORAL_TABLET | Freq: Once | ORAL | Status: AC
  Administered 2021-09-26: 25 mg via ORAL
  Filled 2021-09-26: qty 1

## 2021-09-26 MED ORDER — MECLIZINE HCL 25 MG PO TABS: 25.0000 mg | ORAL_TABLET | Freq: Three times a day (TID) | ORAL | 0 refills | Status: DC | PRN

## 2021-09-26 NOTE — ED Notes (Signed)
Pt ambulated well with no nausea and no lightheadedness.

## 2021-09-26 NOTE — ED Notes (Signed)
Pt denies any dizziness at this time

## 2021-09-26 NOTE — ED Triage Notes (Signed)
Patient here POV from Home with Family.  Patient endorses Lightheadedness and Fuzziness for approximately 1 week. Today the Patient was at the Dentist when she had an Episode of Nausea, Emesis, and Shakiness.   No Fevers. No Diarrhea. No Urinary Symptoms.  NAD Noted during Triage. A&Ox4. GCS 15. BIB Wheelchair.

## 2021-09-26 NOTE — ED Provider Notes (Signed)
[facility]  Provider Note  CSN: [id number] Arrival date & time: 09/26/21 1422  History Chief Complaint  Patient presents with   Nausea    Kristi Willis is a 81 y.o. female with history of primary progressive aphasia communicates mostly with writing, here with her daughter who provides much of the history. Patient was at the dentist today for a routine cleaning when she had an episode of dizziness, shaking, nausea and vomiting when she was sitting up at the end. She indicates this was a room-spinning sensation. She apparently has had some position dizziness for about a week but had not told her daughter about it until today. She is feeling better at the time of my evaluation. No further nausea or dizziness now. She was recently treated for a cellulitis of her L leg, had some ear ache before then that has since resolved.    Home Medications Prior to Admission medications   Medication Sig Start Date End Date Taking? Authorizing Provider  meclizine (ANTIVERT) 25 MG tablet Take 1 tablet (25 mg total) by mouth 3 (three) times daily as needed for dizziness. 09/26/21  Yes Truddie Hidden, MD  Accu-Chek Softclix Lancets lancets CHECK BLOOD SUGAR ONCE DAILY OR AS DIRECTED Dx E11.40 11/23/20   Sharion Balloon, FNP  acetaminophen (TYLENOL) 325 MG tablet Take 2 tablets (650 mg total) by mouth every 6 (six) hours as needed for mild pain or fever (or Fever >/= 101). 04/24/21   Hawks, Alyse Low A, FNP  acyclovir ointment (ZOVIRAX) 5 % APPLY TO THE AFFECTED AREA AROUND MOUTH EVERY 3 HOURS AS DIRECTED UNTIL CLEAR 05/31/19   Hawks, Alyse Low A, FNP  albuterol (PROVENTIL) (2.5 MG/3ML) 0.083% nebulizer solution Take 3 mLs (2.5 mg total) by nebulization every 6 (six) hours as needed for wheezing or shortness of breath. 02/09/21   Loman Brooklyn, FNP  albuterol (VENTOLIN HFA) 108 (90 Base) MCG/ACT inhaler Inhale 2 puffs into the lungs every 6 (six) hours as needed for wheezing or  shortness of breath. 02/08/21   Loman Brooklyn, FNP  amLODipine (NORVASC) 5 MG tablet Take 1 tablet (5 mg total) by mouth daily. 04/24/21   Sharion Balloon, FNP  aspirin 81 MG chewable tablet Chew 81 mg by mouth at bedtime.    [provider]  azelastine (ASTELIN) 0.1 % nasal spray Place 2 sprays into both nostrils 2 (two) times daily. 08/24/14   Lysbeth Penner, FNP  Blood Glucose Monitoring Suppl (ACCU-CHEK AVIVA PLUS) w/Device KIT Check sugars daily & as needed Dx E11.9 08/11/19   Evelina Dun A, FNP  Coenzyme Q10 (COQ10 PO) Take 1 tablet by mouth daily.    [provider]  doxazosin (CARDURA) 8 MG tablet TAKE (1) TABLET DAILY FOR HIGH BLOOD PRESSURE. 04/24/21   Evelina Dun A, FNP  doxycycline (VIBRA-TABS) 100 MG tablet Take 1 tablet (100 mg total) by mouth 2 (two) times daily. 1 po bid 09/13/21   Dettinger, Fransisca Kaufmann, MD  EPIPEN 2-PAK 0.3 MG/0.3ML SOAJ injection Inject 0.3 mg into the muscle as needed for anaphylaxis. Reported on 01/24/2016 03/07/14   [provider]  escitalopram (LEXAPRO) 20 MG tablet Take 1 tablet (20 mg total) by mouth daily. (NEEDS TO BE SEEN BEFORE NEXT REFILL) 08/31/21   Evelina Dun A, FNP  glucose blood (ACCU-CHEK AVIVA PLUS) test strip CHECK BLOOD SUGAR ONCE DAILY OR AS DIRECTED Dx E11.40 11/23/20   Evelina Dun A, FNP  guaiFENesin-codeine (CHERATUSSIN AC) 100-10 MG/5ML syrup  Take 5 mLs by mouth every 4 (four) hours as needed for cough. 04/09/21   Hassell Done, Mary-Margaret, FNP  ipratropium-albuterol (DUONEB) 0.5-2.5 (3) MG/3ML SOLN Take 3 mLs by nebulization 2 (two) times daily. 08/07/19   Cristal Deer, MD  ketoconazole (NIZORAL) 2 % cream Apply 1 application topically daily. 05/08/21   Sharion Balloon, FNP  levothyroxine (SYNTHROID) 75 MCG tablet Take 1 tablet (75 mcg total) by mouth daily before breakfast. 04/24/21   Evelina Dun A, FNP  lisinopril (ZESTRIL) 40 MG tablet Take 1 tablet (40 mg total) by mouth daily. 04/24/21   Sharion Balloon,  FNP  loratadine (CLARITIN) 10 MG tablet Take 1 tablet (10 mg total) by mouth daily. 08/24/14   Lysbeth Penner, FNP  metFORMIN (GLUCOPHAGE) 1000 MG tablet TAKE  (1)  TABLET TWICE A DAY WITH MEALS (BREAKFAST AND SUPPER) 04/24/21   Evelina Dun A, FNP  metoprolol succinate (TOPROL-XL) 100 MG 24 hr tablet TAKE (1) TABLET DAILY 04/24/21   Hawks, Christy A, FNP  montelukast (SINGULAIR) 10 MG tablet Take 1 tablet (10 mg total) by mouth at bedtime. 04/24/21   Sharion Balloon, FNP  rosuvastatin (CRESTOR) 20 MG tablet Take 1 tablet (20 mg total) by mouth at bedtime. 04/24/21 04/24/22  Evelina Dun A, FNP  sitaGLIPtin (JANUVIA) 50 MG tablet Take 1 tablet (50 mg total) by mouth daily. 04/24/21   Sharion Balloon, FNP     Allergies    Dimetapp c [phenylephrine-bromphen-codeine], Phenylephrine hcl, Ultram [tramadol hcl], Celebrex [celecoxib], Cortisone, Fenofibrate, and Sulfa antibiotics   Review of Systems   Review of Systems Please see HPI for pertinent positives and negatives  Physical Exam BP (!) 141/50    Pulse 66    Temp 98.5 F (36.9 C) (Oral)    Resp 16    Ht '5\' 4"'  (1.626 m)    Wt 72.6 kg    SpO2 98%    BMI 27.47 kg/m   Physical Exam Vitals and nursing note reviewed.  Constitutional:      Appearance: Normal appearance.  HENT:     Head: Normocephalic and atraumatic.     Nose: Nose normal.     Mouth/Throat:     Mouth: Mucous membranes are moist.  Eyes:     Extraocular Movements: Extraocular movements intact.     Conjunctiva/sclera: Conjunctivae normal.  Cardiovascular:     Rate and Rhythm: Normal rate.  Pulmonary:     Effort: Pulmonary effort is normal.     Breath sounds: Normal breath sounds.  Abdominal:     General: Abdomen is flat.     Palpations: Abdomen is soft.     Tenderness: There is no abdominal tenderness.  Musculoskeletal:        General: No swelling. Normal range of motion.     Cervical back: Neck supple.  Skin:    General: Skin is warm and dry.  Neurological:      Mental Status: She is alert. Mental status is at baseline.     Cranial Nerves: No cranial nerve deficit.     Sensory: No sensory deficit.     Motor: No weakness.     Comments: Aphasia is baseline, otherwise no focal deficits. No nystagmus with sitting up or with lateral gaze  Psychiatric:        Mood and Affect: Mood normal.    ED Results / Procedures / Treatments   EKG EKG Interpretation  Date/Time:  Wednesday September 26 2021 14:38:17 EST Ventricular Rate:  62 PR  Interval:  168 QRS Duration: 94 QT Interval:  482 QTC Calculation: 489 R Axis:   82 Text Interpretation: Normal sinus rhythm Normal ECG When compared with ECG of 10-Feb-2021 14:40, No significant change since last tracing Confirmed by Calvert Cantor [phone]) on 09/26/2021 5:09:42 PM  Procedures Procedures  Medications Ordered in the ED Medications  meclizine (ANTIVERT) tablet 25 mg (25 mg Oral Given 09/26/21 1717)    Initial Impression and Plan  Patient here with an episode of dizziness at the dentist, has had some positional room spinning in recent days. Symtpoms likely vertigo, but given her age and difficulty communicating details will check labs, CT, EKG.   ED Course   Clinical Course as of 09/26/21 1826  Wed Sep 26, 2021  1730 CBC with mild anemia at baseline. BMP is normal UA neg for infection. EKG is at baseline.  [CS]  3536 CT is neg for acute process. Patient remains asymptomatic. Patient is able to stand and walk without difficulty. Plan discharge with Rx for Meclizine, ENT referral for peripheral vertigo and RTED for any other concerns.  [CS]    Clinical Course User Index [CS] Truddie Hidden, MD     MDM Rules/Calculators/A&P Medical Decision Making Amount and/or Complexity of Data Reviewed Labs: ordered. Decision-making details documented in ED Course. Radiology: ordered and independent interpretation performed. Decision-making details documented in ED Course. ECG/medicine tests: ordered and  independent interpretation performed. Decision-making details documented in ED Course.  Risk Prescription drug management. Decision regarding hospitalization.    Final Clinical Impression(s) / ED Diagnoses Final diagnoses:  Benign paroxysmal positional vertigo, unspecified laterality    Rx / DC Orders ED Discharge Orders          Ordered    meclizine (ANTIVERT) 25 MG tablet  3 times daily PRN        09/26/21 1826             Truddie Hidden, MD 09/26/21 [phone]

## 2021-09-26 NOTE — ED Notes (Signed)
Patient transported to CT 

## 2021-09-28 DIAGNOSIS — J3089 Other allergic rhinitis: Secondary | ICD-10-CM | POA: Diagnosis not present

## 2021-09-28 DIAGNOSIS — J301 Allergic rhinitis due to pollen: Secondary | ICD-10-CM | POA: Diagnosis not present

## 2021-09-28 DIAGNOSIS — J3081 Allergic rhinitis due to animal (cat) (dog) hair and dander: Secondary | ICD-10-CM | POA: Diagnosis not present

## 2021-10-01 ENCOUNTER — Other Ambulatory Visit: Payer: Self-pay | Admitting: Family

## 2021-10-02 NOTE — Telephone Encounter (Signed)
Scheduled pt to see pcp on 10/05/21 for med refill. Daughter is aware of appt.

## 2021-10-02 NOTE — Telephone Encounter (Signed)
Hawks. NTBS 30 days given 08/31/21

## 2021-10-03 ENCOUNTER — Telehealth: Payer: Self-pay | Admitting: Family

## 2021-10-03 ENCOUNTER — Other Ambulatory Visit: Payer: Self-pay | Admitting: *Deleted

## 2021-10-03 DIAGNOSIS — J3081 Allergic rhinitis due to animal (cat) (dog) hair and dander: Secondary | ICD-10-CM | POA: Diagnosis not present

## 2021-10-03 DIAGNOSIS — J3089 Other allergic rhinitis: Secondary | ICD-10-CM | POA: Diagnosis not present

## 2021-10-03 DIAGNOSIS — J301 Allergic rhinitis due to pollen: Secondary | ICD-10-CM | POA: Diagnosis not present

## 2021-10-03 MED ORDER — ESCITALOPRAM OXALATE 20 MG PO TABS: 20.0000 mg | ORAL_TABLET | Freq: Every day | ORAL | 0 refills | Status: DC

## 2021-10-03 NOTE — Telephone Encounter (Signed)
Daughter states she has already talked to someone and did not need anything else.

## 2021-10-03 NOTE — Telephone Encounter (Signed)
Patient wants to talk to someone about her moms medicine. Has appt with PCP on 2/17 but would like to talk to someone before that. Aware that another provider will not be able to refill medicine. Please call back.

## 2021-10-05 ENCOUNTER — Encounter: Payer: Self-pay | Admitting: Family

## 2021-10-05 ENCOUNTER — Other Ambulatory Visit: Payer: Self-pay

## 2021-10-05 ENCOUNTER — Ambulatory Visit (HOSPITAL_COMMUNITY)
Admission: RE | Admit: 2021-10-05 | Discharge: 2021-10-05 | Disposition: A | Discharge status: Home / Self Care | Payer: Medicare PPO | Source: Ambulatory Visit | Attending: Family | Admitting: Family

## 2021-10-05 ENCOUNTER — Ambulatory Visit: Payer: Medicare PPO | Admitting: Family

## 2021-10-05 VITALS — BP 115/52 | HR 65 | Temp 97.3°F | Ht 64.0 in | Wt 164.0 lb

## 2021-10-05 DIAGNOSIS — F0280 Dementia in other diseases classified elsewhere without behavioral disturbance: Secondary | ICD-10-CM

## 2021-10-05 DIAGNOSIS — E785 Hyperlipidemia, unspecified: Secondary | ICD-10-CM | POA: Diagnosis not present

## 2021-10-05 DIAGNOSIS — E039 Hypothyroidism, unspecified: Secondary | ICD-10-CM

## 2021-10-05 DIAGNOSIS — G3101 Pick's disease: Secondary | ICD-10-CM | POA: Diagnosis not present

## 2021-10-05 DIAGNOSIS — I10 Essential (primary) hypertension: Secondary | ICD-10-CM

## 2021-10-05 DIAGNOSIS — F331 Major depressive disorder, recurrent, moderate: Secondary | ICD-10-CM

## 2021-10-05 DIAGNOSIS — E1169 Type 2 diabetes mellitus with other specified complication: Secondary | ICD-10-CM

## 2021-10-05 DIAGNOSIS — J4520 Mild intermittent asthma, uncomplicated: Secondary | ICD-10-CM

## 2021-10-05 DIAGNOSIS — E1142 Type 2 diabetes mellitus with diabetic polyneuropathy: Secondary | ICD-10-CM | POA: Diagnosis not present

## 2021-10-05 DIAGNOSIS — G4700 Insomnia, unspecified: Secondary | ICD-10-CM

## 2021-10-05 DIAGNOSIS — F411 Generalized anxiety disorder: Secondary | ICD-10-CM | POA: Diagnosis not present

## 2021-10-05 DIAGNOSIS — Z1231 Encounter for screening mammogram for malignant neoplasm of breast: Secondary | ICD-10-CM | POA: Insufficient documentation

## 2021-10-05 DIAGNOSIS — R062 Wheezing: Secondary | ICD-10-CM | POA: Diagnosis not present

## 2021-10-05 LAB — BAYER DCA HB A1C WAIVED: HB A1C (BAYER DCA - WAIVED): 6.7 % — ABNORMAL HIGH (ref 4.8–5.6)

## 2021-10-05 MED ORDER — SITAGLIPTIN PHOSPHATE 50 MG PO TABS: 50.0000 mg | ORAL_TABLET | Freq: Every day | ORAL | 2 refills | Status: DC

## 2021-10-05 MED ORDER — ALBUTEROL SULFATE (2.5 MG/3ML) 0.083% IN NEBU: 2.5000 mg | INHALATION_SOLUTION | Freq: Four times a day (QID) | RESPIRATORY_TRACT | 1 refills | Status: DC | PRN

## 2021-10-05 MED ORDER — ESCITALOPRAM OXALATE 20 MG PO TABS: 20.0000 mg | ORAL_TABLET | Freq: Every day | ORAL | 3 refills | Status: DC

## 2021-10-05 MED ORDER — AMLODIPINE BESYLATE 5 MG PO TABS: 5.0000 mg | ORAL_TABLET | Freq: Every day | ORAL | 4 refills | Status: DC

## 2021-10-05 MED ORDER — LEVOTHYROXINE SODIUM 75 MCG PO TABS: 75.0000 ug | ORAL_TABLET | Freq: Every day | ORAL | 3 refills | Status: DC

## 2021-10-05 MED ORDER — MECLIZINE HCL 25 MG PO TABS: 25.0000 mg | ORAL_TABLET | Freq: Three times a day (TID) | ORAL | 0 refills | Status: DC | PRN

## 2021-10-05 MED ORDER — LISINOPRIL 20 MG PO TABS
20.0000 mg | ORAL_TABLET | Freq: Every day | ORAL | 3 refills | Status: DC
Start: 2021-10-05 — End: 2022-07-09

## 2021-10-05 MED ORDER — ALBUTEROL SULFATE HFA 108 (90 BASE) MCG/ACT IN AERS: 2.0000 | INHALATION_SPRAY | Freq: Four times a day (QID) | RESPIRATORY_TRACT | 3 refills | Status: DC | PRN

## 2021-10-05 MED ORDER — ROSUVASTATIN CALCIUM 20 MG PO TABS: 20.0000 mg | ORAL_TABLET | Freq: Every day | ORAL | 3 refills | Status: DC

## 2021-10-05 MED ORDER — METFORMIN HCL 1000 MG PO TABS: ORAL_TABLET | ORAL | 3 refills | Status: DC

## 2021-10-05 MED ORDER — DOXAZOSIN MESYLATE 8 MG PO TABS: ORAL_TABLET | ORAL | 2 refills | Status: DC

## 2021-10-05 NOTE — Patient Instructions (Signed)
Health Maintenance After Age 81 After age 81, you are at a higher risk for certain long-term diseases and infections as well as injuries from falls. Falls are a major cause of broken bones and head injuries in people who are older than age 81. Getting regular preventive care can help to keep you healthy and well. Preventive care includes getting regular testing and making lifestyle changes as recommended by your health care provider. Talk with your health care provider about: Which screenings and tests you should have. A screening is a test that checks for a disease when you have no symptoms. A diet and exercise plan that is right for you. What should I know about screenings and tests to prevent falls? Screening and testing are the best ways to find a health problem early. Early diagnosis and treatment give you the best chance of managing medical conditions that are common after age 81. Certain conditions and lifestyle choices may make you more likely to have a fall. Your health care provider may recommend: Regular vision checks. Poor vision and conditions such as cataracts can make you more likely to have a fall. If you wear glasses, make sure to get your prescription updated if your vision changes. Medicine review. Work with your health care provider to regularly review all of the medicines you are taking, including over-the-counter medicines. Ask your health care provider about any side effects that may make you more likely to have a fall. Tell your health care provider if any medicines that you take make you feel dizzy or sleepy. Strength and balance checks. Your health care provider may recommend certain tests to check your strength and balance while standing, walking, or changing positions. Foot health exam. Foot pain and numbness, as well as not wearing proper footwear, can make you more likely to have a fall. Screenings, including: Osteoporosis screening. Osteoporosis is a condition that causes  the bones to get weaker and break more easily. Blood pressure screening. Blood pressure changes and medicines to control blood pressure can make you feel dizzy. Depression screening. You may be more likely to have a fall if you have a fear of falling, feel depressed, or feel unable to do activities that you used to do. Alcohol use screening. Using too much alcohol can affect your balance and may make you more likely to have a fall. Follow these instructions at home: Lifestyle Do not drink alcohol if: Your health care provider tells you not to drink. If you drink alcohol: Limit how much you have to: 0-1 drink a day for women. 0-2 drinks a day for men. Know how much alcohol is in your drink. In the [place] one drink equals one 12 oz bottle of beer (355 mL), one 5 oz glass of wine (148 mL), or one 1 oz glass of hard liquor (44 mL). Do not use any products that contain nicotine or tobacco. These products include cigarettes, chewing tobacco, and vaping devices, such as e-cigarettes. If you need help quitting, ask your health care provider. Activity  Follow a regular exercise program to stay fit. This will help you maintain your balance. Ask your health care provider what types of exercise are appropriate for you. If you need a cane or walker, use it as recommended by your health care provider. Wear supportive shoes that have nonskid soles. Safety  Remove any tripping hazards, such as rugs, cords, and clutter. Install safety equipment such as grab bars in bathrooms and safety rails on stairs. Keep rooms and walkways   well-lit. General instructions Talk with your health care provider about your risks for falling. Tell your health care provider if: You fall. Be sure to tell your health care provider about all falls, even ones that seem minor. You feel dizzy, tiredness (fatigue), or off-balance. Take over-the-counter and prescription medicines only as told by your health care provider. These include  supplements. Eat a healthy diet and maintain a healthy weight. A healthy diet includes low-fat dairy products, low-fat (lean) meats, and fiber from whole grains, beans, and lots of fruits and vegetables. Stay current with your vaccines. Schedule regular health, dental, and eye exams. Summary Having a healthy lifestyle and getting preventive care can help to protect your health and wellness after age 81. Screening and testing are the best way to find a health problem early and help you avoid having a fall. Early diagnosis and treatment give you the best chance for managing medical conditions that are more common for people who are older than age 81. Falls are a major cause of broken bones and head injuries in people who are older than age 81. Take precautions to prevent a fall at home. Work with your health care provider to learn what changes you can make to improve your health and wellness and to prevent falls. This information is not intended to replace advice given to you by your health care provider. Make sure you discuss any questions you have with your health care provider. Document Revised: 12/25/2020 Document Reviewed: 12/25/2020 Elsevier Patient Education  2022 [employer]  

## 2021-10-05 NOTE — Progress Notes (Signed)
Subjective:    Patient ID: Kristi Willis, female    DOB: 17-Oct-1940, 81 y.o.   MRN: [id number]  Chief Complaint  Patient presents with   Medical Management of Chronic Issues   Pt and daughter present today for chronic follow up. She has aphasia so her daughter is doing most of the talking for her today. She has an ipad that she is speaking with also.    She is followed by podiatry every 6 weeks. She is followed by Cardiologists annually.  Followed by Endo for hypothyroid and DM. She is getting allergy injections weekly.   She went to the ED on 09/26/21 for nausea and dizziness and was diagnosed with vertigo.  Hypertension This is a chronic problem. The current episode started more than 1 year ago. The problem has been resolved since onset. The problem is controlled. Pertinent negatives include no blurred vision, malaise/fatigue, peripheral edema or shortness of breath. Risk factors for coronary artery disease include dyslipidemia, obesity and sedentary lifestyle. The current treatment provides moderate improvement. Identifiable causes of hypertension include a thyroid problem.  Asthma She complains of cough and frequent throat clearing. There is no shortness of breath or wheezing. This is a chronic problem. Pertinent negatives include no malaise/fatigue. She reports moderate improvement on treatment. Her past medical history is significant for asthma.  Diabetes She presents for her follow-up diabetic visit. She has type 2 diabetes mellitus. Hypoglycemia symptoms include nervousness/anxiousness. Associated symptoms include fatigue and foot paresthesias. Pertinent negatives for diabetes include no blurred vision. Symptoms are stable. Diabetic complications include peripheral neuropathy. Risk factors for coronary artery disease include dyslipidemia, diabetes mellitus, hypertension, sedentary lifestyle and post-menopausal. She is following a generally healthy diet. (Does not check BS at home)   Thyroid Problem Presents for follow-up visit. Symptoms include anxiety and fatigue. Patient reports no depressed mood or diaphoresis. The symptoms have been stable. Her past medical history is significant for hyperlipidemia.  Hyperlipidemia This is a chronic problem. The current episode started more than 1 year ago. The problem is controlled. Pertinent negatives include no shortness of breath. Current antihyperlipidemic treatment includes statins. The current treatment provides moderate improvement of lipids. Risk factors for coronary artery disease include dyslipidemia, diabetes mellitus, hypertension, a sedentary lifestyle and post-menopausal.  Insomnia Primary symptoms: difficulty falling asleep, frequent awakening, no malaise/fatigue.   The current episode started more than one year. PMH includes: depression.   Anxiety Presents for follow-up visit. Symptoms include excessive worry, insomnia, nervous/anxious behavior and restlessness. Patient reports no depressed mood, irritability or shortness of breath. Symptoms occur occasionally. The severity of symptoms is moderate.   Her past medical history is significant for asthma.  Depression        This is a chronic problem.  The current episode started more than 1 year ago.   The onset quality is gradual.   The problem occurs intermittently.  Associated symptoms include fatigue, insomnia, restlessness and sad.  Associated symptoms include no helplessness and no hopelessness.  Past treatments include SSRIs - Selective serotonin reuptake inhibitors.  Past medical history includes thyroid problem.      Review of Systems  Constitutional:  Positive for fatigue. Negative for diaphoresis, irritability and malaise/fatigue.  Eyes:  Negative for blurred vision.  Respiratory:  Positive for cough. Negative for shortness of breath and wheezing.   Psychiatric/Behavioral:  Positive for depression. The patient is nervous/anxious and has insomnia.   All other  systems reviewed and are negative.     Objective:  Physical Exam Vitals reviewed.  Constitutional:      General: She is not in acute distress.    Appearance: She is well-developed.  HENT:     Head: Normocephalic and atraumatic.     Right Ear: Tympanic membrane normal.     Left Ear: Tympanic membrane normal.  Eyes:     Pupils: Pupils are equal, round, and reactive to light.  Neck:     Thyroid: No thyromegaly.  Cardiovascular:     Rate and Rhythm: Normal rate and regular rhythm.     Heart sounds: Normal heart sounds. No murmur heard. Pulmonary:     Effort: Pulmonary effort is normal. No respiratory distress.     Breath sounds: Normal breath sounds. No wheezing.  Abdominal:     General: Bowel sounds are normal. There is no distension.     Palpations: Abdomen is soft.     Tenderness: There is no abdominal tenderness.  Musculoskeletal:        General: No tenderness. Normal range of motion.     Cervical back: Normal range of motion and neck supple.  Skin:    General: Skin is warm and dry.  Neurological:     Mental Status: She is alert and oriented to person, place, and time.     Cranial Nerves: No cranial nerve deficit.     Deep Tendon Reflexes: Reflexes are normal and symmetric.  Psychiatric:        Speech: She is noncommunicative (aphasia, writes answers).        Behavior: Behavior normal.        Thought Content: Thought content normal.        Judgment: Judgment normal.      BP (!) 115/52    Pulse 65    Temp (!) 97.3 F (36.3 C) (Temporal)    Ht _0  (1.626 m)    Wt 164 lb (74.4 kg)    BMI 28.15 kg/m      Assessment & Plan:  Kristi Willis comes in today with chief complaint of Medical Management of Chronic Issues   Diagnosis and orders addressed:  1. Wheezing  - albuterol (PROVENTIL) (2.5 MG/3ML) 0.083% nebulizer solution; Take 3 mLs (2.5 mg total) by nebulization every 6 (six) hours as needed for wheezing or shortness of breath.  Dispense: 150 mL;  Refill: 1 - albuterol (VENTOLIN HFA) 108 (90 Base) MCG/ACT inhaler; Inhale 2 puffs into the lungs every 6 (six) hours as needed for wheezing or shortness of breath.  Dispense: 1 each; Refill: 3 - CMP14+EGFR - CBC with Differential/Platelet  2. Essential hypertension -Decrease lisinopril to 20 mg from 40 mg - doxazosin (CARDURA) 8 MG tablet; TAKE (1) TABLET DAILY FOR HIGH BLOOD PRESSURE.  Dispense: 90 tablet; Refill: 2 - amLODipine (NORVASC) 5 MG tablet; Take 1 tablet (5 mg total) by mouth daily.  Dispense: 30 tablet; Refill: 4 - lisinopril (ZESTRIL) 20 MG tablet; Take 1 tablet (20 mg total) by mouth daily.  Dispense: 90 tablet; Refill: 3 - CMP14+EGFR - CBC with Differential/Platelet  3. Mild intermittent asthma without complication - EHU31+SHFW - CBC with Differential/Platelet  4. Hypothyroidism, unspecified type - levothyroxine (SYNTHROID) 75 MCG tablet; Take 1 tablet (75 mcg total) by mouth daily before breakfast.  Dispense: 90 tablet; Refill: 3 - CMP14+EGFR - CBC with Differential/Platelet - TSH  5. Hyperlipidemia associated with type 2 diabetes mellitus (HCC)  - rosuvastatin (CRESTOR) 20 MG tablet; Take 1 tablet (20 mg total) by mouth at bedtime.  Dispense: 90  tablet; Refill: 3 - CMP14+EGFR - CBC with Differential/Platelet  6. Primary progressive aphasia ([place]) - CMP14+EGFR - CBC with Differential/Platelet  7. Insomnia, unspecified type - CMP14+EGFR - CBC with Differential/Platelet  8. GAD (generalized anxiety disorder) - escitalopram (LEXAPRO) 20 MG tablet; Take 1 tablet (20 mg total) by mouth daily. (NEEDS TO BE SEEN BEFORE NEXT REFILL)  Dispense: 90 tablet; Refill: 3 - CMP14+EGFR - CBC with Differential/Platelet  9. Moderate episode of recurrent major depressive disorder (HCC) - escitalopram (LEXAPRO) 20 MG tablet; Take 1 tablet (20 mg total) by mouth daily. (NEEDS TO BE SEEN BEFORE NEXT REFILL)  Dispense: 90 tablet; Refill: 3 - CMP14+EGFR - CBC with  Differential/Platelet  10. Type 2 diabetes mellitus with diabetic polyneuropathy, without long-term current use of insulin (HCC)  - metFORMIN (GLUCOPHAGE) 1000 MG tablet; TAKE  (1)  TABLET TWICE A DAY WITH MEALS (BREAKFAST AND SUPPER)  Dispense: 180 tablet; Refill: 3 - sitaGLIPtin (JANUVIA) 50 MG tablet; Take 1 tablet (50 mg total) by mouth daily.  Dispense: 90 tablet; Refill: 2 - Bayer DCA Hb A1c Waived - CMP14+EGFR - CBC with Differential/Platelet   Labs pending Health Maintenance reviewed Diet and exercise encouraged  Follow up plan: 3 months    Evelina Dun, FNP

## 2021-10-06 LAB — CBC WITH DIFFERENTIAL/PLATELET
Basophils Absolute: 0 10*3/uL (ref 0.0–0.2)
Basos: 0 %
EOS (ABSOLUTE): 0.4 10*3/uL (ref 0.0–0.4)
Eos: 5 %
Hematocrit: 30.9 % — ABNORMAL LOW (ref 34.0–46.6)
Hemoglobin: 9.8 g/dL — ABNORMAL LOW (ref 11.1–15.9)
Immature Grans (Abs): 0 10*3/uL (ref 0.0–0.1)
Immature Granulocytes: 0 %
Lymphocytes Absolute: 2.6 10*3/uL (ref 0.7–3.1)
Lymphs: 31 %
MCH: 26.5 pg — ABNORMAL LOW (ref 26.6–33.0)
MCHC: 31.7 g/dL (ref 31.5–35.7)
MCV: 84 fL (ref 79–97)
Monocytes Absolute: 0.8 10*3/uL (ref 0.1–0.9)
Monocytes: 9 %
Neutrophils Absolute: 4.6 10*3/uL (ref 1.4–7.0)
Neutrophils: 55 %
Platelets: 221 10*3/uL (ref 150–450)
RBC: 3.7 x10E6/uL — ABNORMAL LOW (ref 3.77–5.28)
RDW: 13.6 % (ref 11.7–15.4)
WBC: 8.4 10*3/uL (ref 3.4–10.8)

## 2021-10-06 LAB — TSH: TSH: 1.73 u[IU]/mL (ref 0.450–4.500)

## 2021-10-06 LAB — CMP14+EGFR
ALT: 18 IU/L (ref 0–32)
AST: 26 IU/L (ref 0–40)
Albumin/Globulin Ratio: 2.2 (ref 1.2–2.2)
Albumin: 4.3 g/dL (ref 3.7–4.7)
Alkaline Phosphatase: 46 IU/L (ref 44–121)
BUN/Creatinine Ratio: 16 (ref 12–28)
BUN: 10 mg/dL (ref 8–27)
Bilirubin Total: 0.4 mg/dL (ref 0.0–1.2)
CO2: 23 mmol/L (ref 20–29)
Calcium: 9 mg/dL (ref 8.7–10.3)
Chloride: 99 mmol/L (ref 96–106)
Creatinine, Ser: 0.61 mg/dL (ref 0.57–1.00)
Globulin, Total: 2 g/dL (ref 1.5–4.5)
Glucose: 145 mg/dL — ABNORMAL HIGH (ref 70–99)
Potassium: 4.1 mmol/L (ref 3.5–5.2)
Sodium: 137 mmol/L (ref 134–144)
Total Protein: 6.3 g/dL (ref 6.0–8.5)
eGFR: 90 mL/min/{1.73_m2} (ref >59)

## 2021-10-09 ENCOUNTER — Telehealth: Payer: Self-pay | Admitting: Family

## 2021-10-09 ENCOUNTER — Other Ambulatory Visit: Payer: Self-pay | Admitting: Family Medicine

## 2021-10-09 ENCOUNTER — Other Ambulatory Visit: Payer: Self-pay | Admitting: Family

## 2021-10-09 DIAGNOSIS — G3101 Pick's disease: Secondary | ICD-10-CM

## 2021-10-09 DIAGNOSIS — F0280 Dementia in other diseases classified elsewhere without behavioral disturbance: Secondary | ICD-10-CM

## 2021-10-09 DIAGNOSIS — R42 Dizziness and giddiness: Secondary | ICD-10-CM

## 2021-10-09 NOTE — Telephone Encounter (Signed)
Daughter calling to go over labs. Also mom is very dizzy this morning might not have been the meds they gave her a meclizine please advise

## 2021-10-09 NOTE — Telephone Encounter (Signed)
Called and discussed with patient. Iron, folate, and Vit B 12 added. Referral to Neurologists placed.   Evelina Dun, FNP

## 2021-10-09 NOTE — Telephone Encounter (Signed)
Called and spoke with daughter about placing referral to Neurologists. Will add iron and Vit B 12.   Evelina Dun, FNP

## 2021-10-10 ENCOUNTER — Encounter: Payer: Self-pay | Admitting: Physician Assistant

## 2021-10-11 DIAGNOSIS — J301 Allergic rhinitis due to pollen: Secondary | ICD-10-CM | POA: Diagnosis not present

## 2021-10-11 DIAGNOSIS — J3089 Other allergic rhinitis: Secondary | ICD-10-CM | POA: Diagnosis not present

## 2021-10-11 DIAGNOSIS — J3081 Allergic rhinitis due to animal (cat) (dog) hair and dander: Secondary | ICD-10-CM | POA: Diagnosis not present

## 2021-10-11 LAB — SPECIMEN STATUS REPORT

## 2021-10-11 LAB — B12 AND FOLATE PANEL
Folate: 18.6 ng/mL (ref >3.0)
Vitamin B-12: 714 pg/mL (ref 232–1245)

## 2021-10-11 LAB — IRON AND TIBC
Iron Saturation: 7 % — CL (ref 15–55)
Iron: 24 ug/dL — ABNORMAL LOW (ref 27–139)
Total Iron Binding Capacity: 337 ug/dL (ref 250–450)
UIBC: 313 ug/dL (ref 118–369)

## 2021-10-11 NOTE — Telephone Encounter (Signed)
Do you want [place] to send in RX or is OTC ok?

## 2021-10-15 ENCOUNTER — Encounter: Payer: Self-pay | Admitting: Family

## 2021-10-15 ENCOUNTER — Ambulatory Visit (INDEPENDENT_AMBULATORY_CARE_PROVIDER_SITE_OTHER): Payer: Medicare PPO

## 2021-10-15 ENCOUNTER — Ambulatory Visit: Payer: Medicare PPO | Admitting: Family

## 2021-10-15 VITALS — BP 134/63 | HR 63 | Temp 97.6°F | Ht 64.0 in | Wt 162.0 lb

## 2021-10-15 DIAGNOSIS — H18513 Endothelial corneal dystrophy, bilateral: Secondary | ICD-10-CM | POA: Diagnosis not present

## 2021-10-15 DIAGNOSIS — R051 Acute cough: Secondary | ICD-10-CM | POA: Diagnosis not present

## 2021-10-15 DIAGNOSIS — B9689 Other specified bacterial agents as the cause of diseases classified elsewhere: Secondary | ICD-10-CM

## 2021-10-15 DIAGNOSIS — J208 Acute bronchitis due to other specified organisms: Secondary | ICD-10-CM

## 2021-10-15 DIAGNOSIS — H18523 Epithelial (juvenile) corneal dystrophy, bilateral: Secondary | ICD-10-CM | POA: Diagnosis not present

## 2021-10-15 DIAGNOSIS — I517 Cardiomegaly: Secondary | ICD-10-CM | POA: Diagnosis not present

## 2021-10-15 DIAGNOSIS — Z961 Presence of intraocular lens: Secondary | ICD-10-CM | POA: Diagnosis not present

## 2021-10-15 MED ORDER — DOXYCYCLINE HYCLATE 100 MG PO TABS: 100.0000 mg | ORAL_TABLET | Freq: Two times a day (BID) | ORAL | 0 refills | Status: DC

## 2021-10-15 MED ORDER — CETIRIZINE HCL 10 MG PO TABS: 10.0000 mg | ORAL_TABLET | Freq: Every day | ORAL | 1 refills | Status: DC

## 2021-10-15 MED ORDER — PREDNISONE 10 MG (21) PO TBPK: ORAL_TABLET | ORAL | 0 refills | Status: DC

## 2021-10-15 NOTE — Patient Instructions (Signed)
Acute Bronchitis, Adult °Acute bronchitis is sudden inflammation of the main airways (bronchi) that come off the windpipe (trachea) in the lungs. The swelling causes the airways to get smaller and make more mucus than normal. This can make it hard to breathe and can cause coughing or noisy breathing (wheezing). °Acute bronchitis may last several weeks. The cough may last longer. Allergies, asthma, and exposure to smoke may make the condition worse. °What are the causes? °This condition can be caused by germs and by substances that irritate the lungs, including: °Cold and flu viruses. The most common cause of this condition is the virus that causes the common cold. °Bacteria. This is less common. °Breathing in substances that irritate the lungs, including: °Smoke from cigarettes and other forms of tobacco. °Dust and pollen. °Fumes from household cleaning products, gases, or burned fuel. °Indoor or outdoor air pollution. °What increases the risk? °The following factors may make you more likely to develop this condition: °A weak body's defense system, also called the immune system. °A condition that affects your lungs and breathing, such as asthma. °What are the signs or symptoms? °Common symptoms of this condition include: °Coughing. This may bring up clear, yellow, or green mucus from your lungs (sputum). °Wheezing. °Runny or stuffy nose. °Having too much mucus in your lungs (chest congestion). °Shortness of breath. °Aches and pains, including sore throat or chest. °How is this diagnosed? °This condition is usually diagnosed based on: °Your symptoms and medical history. °A physical exam. °You may also have other tests, including tests to rule out other conditions, such as pneumonia. These tests include: °A test of lung function. °Test of a mucus sample to look for the presence of bacteria. °Tests to check the oxygen level in your blood. °Blood tests. °Chest X-ray. °How is this treated? °Most cases of acute bronchitis  clear up over time without treatment. Your health care provider may recommend: °Drinking more fluids to help thin your mucus so it is easier to cough up. °Taking inhaled medicine (inhaler) to improve air flow in and out of your lungs. °Using a vaporizer or a humidifier. These are machines that add water to the air to help you breathe better. °Taking a medicine that thins mucus and clears congestion (expectorant). °Taking a medicine that prevents or stops coughing (cough suppressant). °It is notcommon to take an antibiotic medicine for this condition. °Follow these instructions at home: ° °Take over-the-counter and prescription medicines only as told by your health care provider. °Use an inhaler, vaporizer, or humidifier as told by your health care provider. °Take two teaspoons (10 mL) of honey at bedtime to lessen coughing at night. °Drink enough fluid to keep your urine pale yellow. °Do not use any products that contain nicotine or tobacco. These products include cigarettes, chewing tobacco, and vaping devices, such as e-cigarettes. If you need help quitting, ask your health care provider. °Get plenty of rest. °Return to your normal activities as told by your health care provider. Ask your health care provider what activities are safe for you. °Keep all follow-up visits. This is important. °How is this prevented? °To lower your risk of getting this condition again: °Wash your hands often with soap and water for at least 20 seconds. If soap and water are not available, use hand sanitizer. °Avoid contact with people who have cold symptoms. °Try not to touch your mouth, nose, or eyes with your hands. °Avoid breathing in smoke or chemical fumes. Breathing smoke or chemical fumes will make your condition   worse. °Get the flu shot every year. °Contact a health care provider if: °Your symptoms do not improve after 2 weeks. °You have trouble coughing up the mucus. °Your cough keeps you awake at night. °You have a  fever. °Get help right away if you: °Cough up blood. °Feel pain in your chest. °Have severe shortness of breath. °Faint or keep feeling like you are going to faint. °Have a severe headache. °Have a fever or chills that get worse. °These symptoms may represent a serious problem that is an emergency. Do not wait to see if the symptoms will go away. Get medical help right away. Call your local emergency services (911 in the [place] Do not drive yourself to the hospital. °Summary °Acute bronchitis is inflammation of the main airways (bronchi) that come off the windpipe (trachea) in the lungs. The swelling causes the airways to get smaller and make more mucus than normal. °Drinking more fluids can help thin your mucus so it is easier to cough up. °Take over-the-counter and prescription medicines only as told by your health care provider. °Do not use any products that contain nicotine or tobacco. These products include cigarettes, chewing tobacco, and vaping devices, such as e-cigarettes. If you need help quitting, ask your health care provider. °Contact a health care provider if your symptoms do not improve after 2 weeks. °This information is not intended to replace advice given to you by your health care provider. Make sure you discuss any questions you have with your health care provider. °Document Revised: 12/06/2020 Document Reviewed: 12/06/2020 °Elsevier Patient Education © 2022 [employer] ° °

## 2021-10-15 NOTE — Progress Notes (Signed)
Subjective:    Patient ID: Kristi Willis, female    DOB: 16-Dec-1940, 81 y.o.   MRN: [id number]  Chief Complaint  Patient presents with   Cough    Started last week taking cough meds wants tomake sure    PT presents to the office today for cough.  Cough This is a new problem. The current episode started 1 to 4 weeks ago. The problem has been gradually worsening. The problem occurs every few minutes. The cough is Productive of purulent sputum. Associated symptoms include shortness of breath and wheezing. Pertinent negatives include no chills, ear congestion, ear pain, fever, headaches, myalgias, nasal congestion or postnasal drip. She has tried rest for the symptoms. The treatment provided mild relief.     Review of Systems  Constitutional:  Negative for chills and fever.  HENT:  Negative for ear pain and postnasal drip.   Respiratory:  Positive for cough, shortness of breath and wheezing.   Musculoskeletal:  Negative for myalgias.  Neurological:  Negative for headaches.  All other systems reviewed and are negative.     Objective:   Physical Exam Vitals reviewed.  Constitutional:      General: She is not in acute distress.    Appearance: She is well-developed.  HENT:     Head: Normocephalic and atraumatic.     Right Ear: Tympanic membrane normal.     Left Ear: Tympanic membrane normal.  Eyes:     Pupils: Pupils are equal, round, and reactive to light.  Neck:     Thyroid: No thyromegaly.  Cardiovascular:     Rate and Rhythm: Normal rate and regular rhythm.     Heart sounds: Normal heart sounds. No murmur heard. Pulmonary:     Effort: Pulmonary effort is normal. No respiratory distress.     Breath sounds: Normal breath sounds. No wheezing.  Abdominal:     General: Bowel sounds are normal. There is no distension.     Palpations: Abdomen is soft.     Tenderness: There is no abdominal tenderness.  Musculoskeletal:        General: No tenderness. Normal range of  motion.     Cervical back: Normal range of motion and neck supple.  Skin:    General: Skin is warm and dry.  Neurological:     Mental Status: She is alert and oriented to person, place, and time.     Cranial Nerves: No cranial nerve deficit.     Deep Tendon Reflexes: Reflexes are normal and symmetric.  Psychiatric:        Behavior: Behavior normal.        Thought Content: Thought content normal.        Judgment: Judgment normal.     BP 134/63    Pulse 63    Temp 97.6 F (36.4 C) (Temporal)    Ht 5\' 4"  (1.626 m)    Wt 162 lb (73.5 kg)    SpO2 92%    BMI 27.81 kg/m       Assessment & Plan:  Kristi Willis comes in today with chief complaint of Cough (Started last week taking cough meds wants tomake sure )   Diagnosis and orders addressed:  1. Acute cough - DG Chest 2 View - cetirizine (ZYRTEC ALLERGY) 10 MG tablet; Take 1 tablet (10 mg total) by mouth daily.  Dispense: 90 tablet; Refill: 1 - doxycycline (VIBRA-TABS) 100 MG tablet; Take 1 tablet (100 mg total) by mouth 2 (two) times daily.  Dispense: 20 tablet; Refill: 0 - predniSONE (STERAPRED UNI-PAK 21 TAB) 10 MG (21) TBPK tablet; Use as directed  Dispense: 21 tablet; Refill: 0  2. Acute bacterial bronchitis - Take meds as prescribed - Use a cool mist humidifier  -Use saline nose sprays frequently -Force fluids -For any cough or congestion  Use plain Mucinex- regular strength or max strength is fine -For fever or aces or pains- take tylenol or ibuprofen. -Throat lozenges if help -Follow up if symptoms worsen or do not improve  - DG Chest 2 View - cetirizine (ZYRTEC ALLERGY) 10 MG tablet; Take 1 tablet (10 mg total) by mouth daily.  Dispense: 90 tablet; Refill: 1 - doxycycline (VIBRA-TABS) 100 MG tablet; Take 1 tablet (100 mg total) by mouth 2 (two) times daily.  Dispense: 20 tablet; Refill: 0 - predniSONE (STERAPRED UNI-PAK 21 TAB) 10 MG (21) TBPK tablet; Use as directed  Dispense: 21 tablet; Refill:  0     Evelina Dun, FNP

## 2021-10-17 NOTE — Telephone Encounter (Signed)
Covering pcp please look at xray and advise. ?

## 2021-10-23 ENCOUNTER — Other Ambulatory Visit: Payer: Self-pay

## 2021-10-23 ENCOUNTER — Ambulatory Visit: Payer: Medicare PPO | Admitting: Physician Assistant

## 2021-10-23 ENCOUNTER — Encounter: Payer: Self-pay | Admitting: Physician Assistant

## 2021-10-23 VITALS — BP 124/53 | HR 68 | Ht 64.0 in | Wt 159.6 lb

## 2021-10-23 DIAGNOSIS — R42 Dizziness and giddiness: Secondary | ICD-10-CM

## 2021-10-23 DIAGNOSIS — F0280 Dementia in other diseases classified elsewhere without behavioral disturbance: Secondary | ICD-10-CM | POA: Diagnosis not present

## 2021-10-23 DIAGNOSIS — G3101 Pick's disease: Secondary | ICD-10-CM | POA: Diagnosis not present

## 2021-10-23 NOTE — Progress Notes (Signed)
Assessment/Plan:   Kristi Willis is a very pleasant 81 y.o. year old RH female with  a history of hypertension, hyperlipidemia, insomnia, DM2, hypothyroidism, QT prolongation, depression,Vit D deficiency, anemia, and PPA non fluent variant, seen today for evaluation vertigo. CT of the head without evidence of acute intracranial abnormality, such as infarction, hemorrhage, or intracranial mass.  Mild atrophy noted.  Stable ventricle size.  Vertigo appears positional, was unable to be reproduced today during exam.  No nystagmus was noted.  Patient is on meclizine as needed, with good resolution of the symptoms.    Recommendations:   Vertigo, likely BPV Agree with ENT evaluation Recommend vestibular therapy for vertigo Continue Antivert for dizziness and vertigo as needed Follow-up in 6 months  PPA nonfluent variant Aphasia at baseline, patient is essentially nonverbal but able to communicate via iPad.  Comprehension is intact. Continue to monitor  Subjective:    This is a very pleasant 81 year old right-handed woman with extensive medical history, including PPA nonfluent variant, seen in the past for same, at this point, communicating via pad, and referring herself to third person "Lelon Frohlich", stable from the cognitive standpoint, not seen since 2021.  She had speech therapy with home exercises.  She has a history of asymmetric hearing loss, followed by ENT, and schedule a visit soon. She is seen today for evaluation of vertigo.  In review, she was at the dentist office during a routine visit, developing after this visit dizziness, and felt that "everything was spinning and moving ".  This vertigo sporadically shows up.  Symptoms are ameliorated by taking meclizine.  She experiences this about 2-3 times a week, and it can happen even in the middle of the night.  She denies any headaches or vision changes, although if she moves her eyes rapidly, will get vertigo according to her daughter.  She  has a corneal procedure soon.  There is no arm weakness or swallowing issues.  Ambulates "a little off balance and uses a walker for stability ".  She had recent cellulitis in the right leg, treated with antibiotics.  No recent long distance trips, no new areas numbness or tingling (she has diabetic PN).  She may have a history of seasonal allergies, and reports that her right nostril "drips water and so that is her right eye always with tears"-daughter says.  Due to the symptoms, she presented to the ED, a CT was performed on 09/26/2021, with negative acute findings.  Other work-up including EKG, UA, CBC, C met was negative as well.  No nystagmus was present during that exam or lateral gaze.  Meclizine 25 mg was given with good relief. As mentioned above, her aphasia is at baseline perhaps with mild worsening of her speech, she now has to "write everything "or names of the stores had to be changed such as [place] is "[place] "., with no other focal deficits.  She still is independent, lives by herself, pays her own bills without missing any payments, and manages her own medications.        HPI 06/10/2016 for PPA: This is a 81 yo RH woman with a history of hypertension, diabetes, neuropathy, who presented for evaluation of word-finding difficulties. She and her daughter started noticing symptoms for the past couple of years, but worsened after her husband passed away last 12-May-2023. She reports that she knows what she wants to say but it does not come out as it should. Her daughter noticed it was hard to have  conversation with her. She started to want to be alone and stay at home due to this. She was started on an antidepressant, and reports that symptoms did improve a little, but she still struggles to get out the idea of what she wants to say. She has good and bad days. She lives alone and denies getting lost driving. Her daughter though reports she falls asleep driving so her sister is with her for long  distance driving. She occasionally forgets her medications and occasionally misplaces things. They could not find their Christmas gifts and got them in July. No missed bill payments. No hygiene issues, no difficulties with ADLs. Her daughter feels that the confusion is a little better, but she is still not comfortable talking to other people. She had an MRI brain without contrast done 04/17/16 for these symptoms which I personally reviewed, no acute changes, there was mild diffuse volume loss.   She has neuropathy with burning and numbness in both feet. She has occasional back pain, no falls. She occasionally has problems swallowing meat. She denies any headaches, dizziness, diplopia, dysarthria, neck pain, focal numbness/tingling/weakness, bowel/bladder dysfunction, anosmia, or tremors. No family history of dementia, no history of significant head injuries. She occasionally drinks alcohol.   Allergies  Allergen Reactions   Dimetapp C [Phenylephrine-Bromphen-Codeine] Itching   Phenylephrine Hcl Itching   Shingrix [Zoster Vac Recomb Adjuvanted] Other (See Comments)    Swelling and fever    Ultram [Tramadol Hcl] Other (See Comments)    Insomnia   Celebrex [Celecoxib] Rash   Cortisone Rash   Fenofibrate Rash   Sulfa Antibiotics Rash    Current Outpatient Medications  Medication Instructions   Accu-Chek Softclix Lancets lancets CHECK BLOOD SUGAR ONCE Willis OR AS DIRECTED Dx E11.40   acetaminophen (TYLENOL) 650 mg, Oral, Every 6 hours PRN   acyclovir ointment (ZOVIRAX) 5 % APPLY TO THE AFFECTED AREA AROUND MOUTH EVERY 3 HOURS AS DIRECTED UNTIL CLEAR   albuterol (PROVENTIL) 2.5 mg, Nebulization, Every 6 hours PRN   albuterol (VENTOLIN HFA) 108 (90 Base) MCG/ACT inhaler 2 puffs, Inhalation, Every 6 hours PRN   amLODipine (NORVASC) 5 mg, Oral, Willis   aspirin 81 mg, Oral, Willis at bedtime   azelastine (ASTELIN) 0.1 % nasal spray 2 sprays, Each Nare, 2 times Willis   Blood Glucose Monitoring Suppl  (ACCU-CHEK AVIVA PLUS) w/Device KIT Check sugars Willis & as needed Dx E11.9   cetirizine (ZYRTEC ALLERGY) 10 mg, Oral, Willis   Coenzyme Q10 (COQ10 PO) 1 tablet, Oral, Willis   doxazosin (CARDURA) 8 MG tablet TAKE (1) TABLET Willis FOR HIGH BLOOD PRESSURE.   EpiPen 2-Pak 0.3 mg, Intramuscular, As needed, Reported on 01/24/2016   escitalopram (LEXAPRO) 20 mg, Oral, Willis, (NEEDS TO BE SEEN BEFORE NEXT REFILL)   glucose blood (ACCU-CHEK AVIVA PLUS) test strip CHECK BLOOD SUGAR ONCE Willis OR AS DIRECTED Dx E11.40   ipratropium-albuterol (DUONEB) 0.5-2.5 (3) MG/3ML SOLN 3 mLs, Nebulization, 2 times Willis   ketoconazole (NIZORAL) 2 % cream 1 application., Topical, Willis   levothyroxine (SYNTHROID) 75 mcg, Oral, Willis before breakfast   lisinopril (ZESTRIL) 20 mg, Oral, Willis   meclizine (ANTIVERT) 25 mg, Oral, 3 times Willis PRN   metFORMIN (GLUCOPHAGE) 1000 MG tablet TAKE  (1)  TABLET TWICE A DAY WITH MEALS (BREAKFAST AND SUPPER)   metoprolol succinate (TOPROL-XL) 100 MG 24 hr tablet TAKE (1) TABLET Willis   montelukast (SINGULAIR) 10 mg, Oral, Willis at bedtime   rosuvastatin (CRESTOR) 20 mg, Oral, Willis at  bedtime   sitaGLIPtin (JANUVIA) 50 mg, Oral, Willis     VITALS:   Vitals:   10/23/21 1313  BP: (!) 124/53  Pulse: 68  SpO2: 96%  Weight: 159 lb 9.6 oz (72.4 kg)  Height: _0  (1.626 m)    PHYSICAL EXAM   HEENT:  Normocephalic, atraumatic. The mucous membranes are moist. The superficial temporal arteries are without ropiness or tenderness. Cardiovascular: Regular rate and rhythm. Lungs: Clear to auscultation bilaterally. Neck: There are no carotid bruits noted bilaterally.  NEUROLOGICAL: Montreal Cognitive Assessment  06/10/2016  Visuospatial/ Executive (0/5) 5  Naming (0/3) 3  Attention: Read list of digits (0/2) 1  Attention: Read list of letters (0/1) 0  Attention: Serial 7 subtraction starting at 100 (0/3) 1  Language: Repeat phrase (0/2) 1  Language : Fluency (0/1) 0   Abstraction (0/2) 2  Delayed Recall (0/5) 5  Orientation (0/6) 5  Total 23  Adjusted Score (based on education) 24   MMSE - Mini Mental State Exam 09/17/2017 06/05/2016 12/22/2014  Orientation to time _1 Orientation to Place _2 Registration _3 Attention/ Calculation _4 Recall _5 Language- name 2 objects _6 Language- repeat 0 0 0  Language- follow 3 step command _7 Language- read & follow direction _8 Write a sentence _9 Copy design _10 Total score _11 No flowsheet data found.   Orientation:  Alert and oriented to person, place and time. She has aphasia, no dysarthria. Fund of knowledge is appropriate. Recent memory impaired and remote memory intact.  Attention and concentration are normal.    Cranial nerves: There is good facial symmetry. Extraocular muscles are intact and visual fields are full to confrontational testing. No nystagmus. Speech is nonfluent but clear when saying "no". Soft palate rises symmetrically and there is no tongue deviation. Hearing is intact to conversational tone. Tone: Tone is good throughout. Sensation: Sensation is intact to light touch and pinprick throughout. Vibration is intact at the bilateral big toe.There is no extinction with double simultaneous stimulation. There is no sensory dermatomal level identified. She has known neuropathy of hands and feet Coordination: The patient has no difficulty with RAM's or FNF bilaterally. Normal finger to nose  Motor: Strength is 5/5 in the bilateral upper and lower extremities. There is no pronator drift. There are no fasciculations noted. DTR's: Deep tendon reflexes are 2/4 at the bilateral biceps, triceps, brachioradialis, patella and achilles.  Plantar responses are downgoing bilaterally. Gait and Station: The patient is able to ambulate without difficulty.The patient is able to heel toe walk without any difficulty.The patient is able to ambulate in a tandem fashion.  The patient is able to stand in the Romberg position.     Thank you for allowing [place] the opportunity to participate in the care of this nice patient. Please do not hesitate to contact us for any questions or concerns.   Total time spent on today's visit was 60 minutes, including both face-to-face time and nonface-to-face time.  Time included that spent on review of records (prior notes available to me/labs/imaging if pertinent), discussing treatment and goals, answering patient's questions and coordinating care.  Cc:  Sharion Balloon, FNP  Sharene Butters 10/24/2021 7:21 AM

## 2021-10-23 NOTE — Patient Instructions (Signed)
Follow in 6 months ?Continue with your medicines including Antivert for dizziness and vertigo ?Agree with ENT visit  ?Vestibular Therapy  ?Continue Lexapro 20 mg daily ? ?

## 2021-10-29 ENCOUNTER — Other Ambulatory Visit: Payer: Self-pay | Admitting: Family

## 2021-10-29 DIAGNOSIS — J301 Allergic rhinitis due to pollen: Secondary | ICD-10-CM | POA: Diagnosis not present

## 2021-10-29 DIAGNOSIS — J3081 Allergic rhinitis due to animal (cat) (dog) hair and dander: Secondary | ICD-10-CM | POA: Diagnosis not present

## 2021-10-29 DIAGNOSIS — J3089 Other allergic rhinitis: Secondary | ICD-10-CM | POA: Diagnosis not present

## 2021-11-07 ENCOUNTER — Encounter (HOSPITAL_COMMUNITY): Payer: Self-pay | Admitting: Physical Therapy

## 2021-11-07 ENCOUNTER — Ambulatory Visit (HOSPITAL_COMMUNITY): Payer: Medicare PPO | Attending: Physician Assistant | Admitting: Physical Therapy

## 2021-11-07 ENCOUNTER — Other Ambulatory Visit: Payer: Self-pay

## 2021-11-07 DIAGNOSIS — H8111 Benign paroxysmal vertigo, right ear: Secondary | ICD-10-CM | POA: Insufficient documentation

## 2021-11-07 DIAGNOSIS — R42 Dizziness and giddiness: Secondary | ICD-10-CM | POA: Diagnosis not present

## 2021-11-07 DIAGNOSIS — R2689 Other abnormalities of gait and mobility: Secondary | ICD-10-CM | POA: Insufficient documentation

## 2021-11-07 DIAGNOSIS — J3089 Other allergic rhinitis: Secondary | ICD-10-CM | POA: Diagnosis not present

## 2021-11-07 DIAGNOSIS — J301 Allergic rhinitis due to pollen: Secondary | ICD-10-CM | POA: Diagnosis not present

## 2021-11-07 DIAGNOSIS — J3081 Allergic rhinitis due to animal (cat) (dog) hair and dander: Secondary | ICD-10-CM | POA: Diagnosis not present

## 2021-11-07 NOTE — Therapy (Signed)
?OUTPATIENT PHYSICAL THERAPY VESTIBULAR EVALUATION ? ? ? ? ?Patient Name: Kristi Willis ?MRN: [id number] ?DOB:1940-09-05, 81 y.o., female ?Today's Date: 11/07/2021 ? ?PCP: Sharion Balloon, FNP ?REFERRING PROVIDER: Rondel Jumbo, PA-C ? ? PT End of Session - 11/07/21 1006   ? ? Visit Number 1   ? Number of Visits 12   ? Date for PT Re-Evaluation 12/19/21   ? Authorization Type [employer] -Requested 12 visits at evaluation   ? PT Start Time 1007   ? PT Stop Time 1045   ? PT Time Calculation (min) 38 min   ? Activity Tolerance Patient tolerated treatment well   ? Behavior During Therapy [facility] for tasks assessed/performed   ? ?  ?  ? ?  ? ? ?Past Medical History:  ?Diagnosis Date  ? Allergy   ? Asthma   ? Cancer [facility])   ? skin  ? Diabetes mellitus   ? GERD (gastroesophageal reflux disease)   ? hiatal hernia  ? Hypercholesteremia   ? Hypertension   ? Neuromuscular disorder ([place])   ? DM neuropathy  ? Neuropathy   ? SVT (supraventricular tachycardia) ([place])   ? ?Past Surgical History:  ?Procedure Laterality Date  ? BREAST BIOPSY    ? left breast biopsy  ? LUMBAR LAMINECTOMY  08/10  ? SKIN CANCER EXCISION  2005  ? SPINE SURGERY    ? lumbar laminectomy  ? ?Patient Active Problem List  ? Diagnosis Date Noted  ? Pulmonary congestion 02/11/2021  ? Aortic atherosclerosis ([place]) 02/11/2021  ? Hypo-osmolar hyponatremia 02/10/2021  ? Primary progressive aphasia ([place]) 03/23/2020  ? Pain in left shoulder 11/03/2019  ? Acute metabolic encephalopathy 02/77/4128  ? Anemia 08/05/2019  ? QT prolongation 08/05/2019  ? Hypothyroidism 02/12/2019  ? Speech articulation disorder 09/01/2018  ? Essential hypertension 10/03/2017  ? GAD (generalized anxiety disorder) 07/03/2017  ? Diabetes mellitus ([place]) 09/27/2016  ? Depression 03/29/2016  ? Insomnia 07/18/2015  ? Vitamin D deficiency 10/11/2013  ? Right carotid bruit 03/02/2013  ? Degenerative disc disease, lumbar 11/27/2012  ? Asthma 11/13/2010  ? Hyperlipidemia associated with type 2  diabetes mellitus ([place]) 11/13/2010  ? Cancer of skin 11/13/2010  ? Diabetic neuropathy ([place]) 11/13/2010  ? Wide-complex tachycardia==Atrial Tachycardia 11/13/2010  ? ? ?ONSET DATE: 09/26/2021 ? ?REFERRING DIAG: Vertigo  ? ?THERAPY DIAG:  ?Dizziness  ? ?SUBJECTIVE:  ? ?SUBJECTIVE STATEMENT: Information is from daughter.  She was at the dentist office during a routine visit, developing after this visit dizziness, and felt that "everything was spinning and moving ". This vertigo sporadically shows up. Symptoms are ameliorated by taking meclizine. She experiences this about 2-3 times a week, and it can happen even in the middle of the night.  She has not had any for two weeks.  ? ?Pt accompanied by: family member ? ?PERTINENT HISTORY:  PT has aphasia  ?a CT was performed on 09/26/2021, with negative acute findings. Other work-up including EKG, UA, CBC, C met was negative as well.  ? ?PAIN:  ?Are you having pain? No ? ?PRECAUTIONS: None ? ?WEIGHT BEARING RESTRICTIONS No ? ?FALLS: Has patient fallen in last 6 months? No, Number of falls: 0 ? ?LIVING ENVIRONMENT: ?Lives with: lives with their family and lives alone ?Lives in: House/apartment ?Stairs: Yes; External: 5 steps; on right going up ?Has following equipment at home: None ? ?PLOF: Independent ? ?PATIENT GOALS per daughter less dizziness  ? ?OBJECTIVE:  ? ?DIAGNOSTIC FINDINGS: a CT was performed on 09/26/2021, with  negative acute findings. Other work-up including EKG, UA, CBC, C met was negative as well.  ? ? ?COGNITION: ?Overall cognitive status: Difficulty to assess ?  ?POSTURE: rounded shoulders and forward head ? ? ?Cervical ROM:   ? ?Active A/PROM (deg) ?11/07/2021  ?Flexion   ?Extension   ?Right lateral flexion   ?Left lateral flexion   ?Right rotation 50 ?  ?Left rotation 60  ?(Blank rows = not tested) ? ?PATIENT SURVEYS:  ?FOTO 50 ? ? ?VESTIBULAR ASSESSMENT ? ? GENERAL OBSERVATION: puffiness around eyes  ?  ? SYMPTOM BEHAVIOR: ?  Subjective history: see above   ?  Type of dizziness: Spinning/Vertigo ?  Frequency: has not occurred for 2 weeks  ?  Duration: for days  ?  Aggravating factors: No known aggravating factors ?  Relieving factors: no known relieving factors ?  Progression of symptoms: better ? ? OCULOMOTOR EXAM: ?  Ocular Alignment: normal puffiness around eyes ?  Ocular ROM: No Limitations ?  Spontaneous Nystagmus: absent ?  Gaze-Induced Nystagmus: absent ?  Smooth Pursuits: intact ?  Saccades: slow ?   ?           Balance:  unable to single leg stance on either leg ? ? VESTIBULAR - OCULAR REFLEX:  ?   ?  VOR Cancellation: Normal ?  Head-Impulse Test: HIT Right: positive ?   POSITIONAL TESTING: Right Dix-Hallpike: upbeating, right nystagmus; Duration:10 ?Left Dix-Hallpike: none; Duration: 0 ?  ? ?MOTION SENSITIVITY: ? ?  Motion Sensitivity Quotient ? ?Intensity: 0 = none, 1 = Lightheaded, 2 = Mild, 3 = Moderate, 4 = Severe, 5 = Vomiting ? Intensity  ?1. Sitting to supine   ?2. Supine to L side   ?3. Supine to R side   ?4. Supine to sitting   ?5. L Hallpike-Dix 0  ?6. Up from L  0  ?7. R Hallpike-Dix 3  ?8. Up from R  2  ?9. Sitting, head  ?tipped to L knee   ?10. Head up from L  ?knee 2  ?11. Sitting, head  ?tipped to R knee   ?12. Head up from R  ?knee 1  ?13. Sitting head turns x5 0  ?14.Sitting head nods x5 0  ?15. In stance, 180?  ?turn to L    ?16. In stance, 180?  ?turn to R   ?  ?VESTIBULAR TREATMENT: ? ?Gaze Adaptation: ?  x1 Viewing Horizontal: Reps: 5 and x1 Viewing Vertical:  Position: seated  and Reps: 5 ?Habituation: ?  Other: cervical rotation; nose to knee; smooth pursuits vertical, horizontal x 5  ? ? ?PATIENT EDUCATION: ?Education details: tall posture, cervical rotation, smooth pursuits for vertical, horizontal and diagonal  ?Person educated: Patient and Child(ren) ?Education method: Explanation, Demonstration, Verbal cues, and Handouts ?Education comprehension: verbalized understanding and returned demonstration ? ? ?GOALS: ?Goals reviewed with  patient? No will review next treatment.  ? ?SHORT TERM GOALS: Target date: 11/28/2021 ? ?Pt to be I in HEP to be able to state no dizziness for the past two weeks  ?Baseline: see above  ?Goal status: INITIAL ? ?2.  Pt to be able to single leg stance for three seconds to decrease risk of falling  ?Baseline: unable  ?Goal status: INITIAL ? ? ? ? ?LONG TERM GOALS: Target date: 12/19/2021 ? ?Pt to be I in HEP to be able to state no sx of dizziness for the four week  ?Baseline: above  ?Goal status: INITIAL ? ?2.  PT to  be able to single leg stance on each leg for 8 seconds to decrease risk of falls  ?Baseline: see above  ?Goal status: INITIAL ? ? ?ASSESSMENT: ? ?CLINICAL IMPRESSION: ?Patient is a 81 y.o. female who was seen today for physical therapy evaluation and treatment for BPPV following a trip to the dentist.  Evaluation demonstrates Eye exam demonstrates slow response and difficulty following objects, Dix halpike test was positive to the right, postural dysfunction and inability to single leg stance.  Ms. Zwiebel will benefit from skilled PT to address these symptoms and decrease her risk of falling.  ? ? ?OBJECTIVE IMPAIRMENTS decreased balance, decreased ROM, postural dysfunction, and dizziness .  ? ?ACTIVITY LIMITATIONS cleaning, community activity, and driving.  ? ?PERSONAL FACTORS Age, Behavior pattern, Fitness, and 1 comorbidity: aphasic  are also affecting patient's functional outcome.  ? ? ?REHAB POTENTIAL: Good ? ?CLINICAL DECISION MAKING: Stable/uncomplicated ? ?EVALUATION COMPLEXITY: Moderate ? ? ?PLAN: ?PT FREQUENCY: 2x/week ? ?PT DURATION: 6 weeks ? ?PLANNED INTERVENTIONS: Therapeutic exercises, Therapeutic activity, Neuromuscular re-education, Balance training, Patient/Family education, and Manual therapy ? ?PLAN FOR NEXT SESSION: Begin Eply maneuver followed by balance  ? ?Rayetta Humphrey, PT CLT ?[phone]  ?11/07/2021, 13:10 ?

## 2021-11-08 ENCOUNTER — Ambulatory Visit: Payer: Medicare PPO | Admitting: Family

## 2021-11-13 ENCOUNTER — Other Ambulatory Visit: Payer: Self-pay

## 2021-11-13 ENCOUNTER — Encounter (HOSPITAL_COMMUNITY): Payer: Self-pay

## 2021-11-13 ENCOUNTER — Ambulatory Visit (HOSPITAL_COMMUNITY): Payer: Medicare PPO

## 2021-11-13 DIAGNOSIS — R42 Dizziness and giddiness: Secondary | ICD-10-CM | POA: Diagnosis not present

## 2021-11-13 DIAGNOSIS — R2689 Other abnormalities of gait and mobility: Secondary | ICD-10-CM | POA: Diagnosis not present

## 2021-11-13 DIAGNOSIS — H8111 Benign paroxysmal vertigo, right ear: Secondary | ICD-10-CM

## 2021-11-13 NOTE — Therapy (Signed)
OUTPATIENT PHYSICAL THERAPY TREATMENT NOTE   Patient Name: Kristi Willis MRN: [id number] DOB:Mar 07, 1941, 81 y.o., female Today's Date: 11/13/2021  PCP: Junie Spencer, FNP REFERRING PROVIDER: Junie Spencer, FNP    Past Medical History:  Diagnosis Date   Allergy    Asthma    Cancer (HCC)    skin   Diabetes mellitus    GERD (gastroesophageal reflux disease)    hiatal hernia   Hypercholesteremia    Hypertension    Neuromuscular disorder (HCC)    DM neuropathy   Neuropathy    SVT (supraventricular tachycardia) (HCC)    Past Surgical History:  Procedure Laterality Date   BREAST BIOPSY     left breast biopsy   LUMBAR LAMINECTOMY  08/10   SKIN CANCER EXCISION  2005   SPINE SURGERY     lumbar laminectomy   Patient Active Problem List   Diagnosis Date Noted   Pulmonary congestion 02/11/2021   Aortic atherosclerosis (HCC) 02/11/2021   Hypo-osmolar hyponatremia 02/10/2021   Primary progressive aphasia (HCC) 03/23/2020   Pain in left shoulder 11/03/2019   Acute metabolic encephalopathy 08/05/2019   Anemia 08/05/2019   QT prolongation 08/05/2019   Hypothyroidism 02/12/2019   Speech articulation disorder 09/01/2018   Essential hypertension 10/03/2017   GAD (generalized anxiety disorder) 07/03/2017   Diabetes mellitus (HCC) 09/27/2016   Depression 03/29/2016   Insomnia 07/18/2015   Vitamin D deficiency 10/11/2013   Right carotid bruit 03/02/2013   Degenerative disc disease, lumbar 11/27/2012   Asthma 11/13/2010   Hyperlipidemia associated with type 2 diabetes mellitus (HCC) 11/13/2010   Cancer of skin 11/13/2010   Diabetic neuropathy (HCC) 11/13/2010   Wide-complex tachycardia==Atrial Tachycardia 11/13/2010    REFERRING DIAG: BPPV  THERAPY DIAG:  No diagnosis found.  PERTINENT HISTORY: aphasia  PRECAUTIONS: fall  SUBJECTIVE: Patient with aphasia; hard to assess her answers occasionally.  Therapist talks slow and deliberately after patient asks  therapist to slow down.  Patient appears to state she has had less dizziness since last visit.  She denies any pain or falls.  Her sister Eber Jones is with her today.   PAIN:  Are you having pain? No     OBJECTIVE:    DIAGNOSTIC FINDINGS: a CT was performed on 09/26/2021, with negative acute findings. Other work-up including EKG, UA, CBC, C met was negative as well.      COGNITION: Overall cognitive status: Difficulty to assess             POSTURE: rounded shoulders and forward head     Cervical ROM:     Active A/PROM (deg) 11/07/2021  Flexion    Extension    Right lateral flexion    Left lateral flexion    Right rotation 50    Left rotation 60  (Blank rows = not tested)   PATIENT SURVEYS:  FOTO 50     VESTIBULAR ASSESSMENT              GENERAL OBSERVATION: puffiness around eyes                         SYMPTOM BEHAVIOR:                       Subjective history: see above                        Type of dizziness: Spinning/Vertigo  Frequency: has not occurred for 2 weeks                        Duration: for days                        Aggravating factors: No known aggravating factors                       Relieving factors: no known relieving factors                       Progression of symptoms: better              OCULOMOTOR EXAM:                       Ocular Alignment: normal puffiness around eyes                       Ocular ROM: No Limitations                       Spontaneous Nystagmus: absent                       Gaze-Induced Nystagmus: absent                       Smooth Pursuits: intact                       Saccades: slow                                   Balance:  unable to single leg stance on either leg              VESTIBULAR - OCULAR REFLEX:                                               VOR Cancellation: Normal                       Head-Impulse Test: HIT Right: positive                                  POSITIONAL TESTING:  Right Dix-Hallpike: upbeating, right nystagmus; Duration:10 Left Dix-Hallpike: none; Duration: 0               MOTION SENSITIVITY:                         Motion Sensitivity Quotient   Intensity: 0 = none, 1 = Lightheaded, 2 = Mild, 3 = Moderate, 4 = Severe, 5 = Vomiting   Intensity  1. Sitting to supine    2. Supine to L side    3. Supine to R side    4. Supine to sitting    5. L Hallpike-Dix 0  6. Up from L  0  7. R Hallpike-Dix 3  8. Up from R  2  9. Sitting, head  tipped to L knee  10. Head up from L  knee 2  11. Sitting, head  tipped to R knee    12. Head up from R  knee 1  13. Sitting head turns x5 0  14.Sitting head nods x5 0  15. In stance, 180  turn to L     16. In stance, 180  turn to R                VESTIBULAR TREATMENT:  11/13/21  Epley maneuver to the Right x 2.  Patient has some difficulty following commands the first trial and unsure of patient assessment of her dizziness however she states clearly the 2nd time she has some dizziness that lasts only a few seconds to the Right and then no dizziness with head turn to the Left or with sitting up.  Therapist also added gaze stabilization with head turns today.   EVAL Gaze Adaptation:                       x1 Viewing Horizontal: Reps: 5 and x1 Viewing Vertical:  Position: seated  and Reps: 5 Habituation:                       Other: cervical rotation; nose to knee; smooth pursuits vertical, horizontal x 5      PATIENT EDUCATION: 11/13/21 OBJECTIVE:    DIAGNOSTIC FINDINGS: a CT was performed on 09/26/2021, with negative acute findings. Other work-up including EKG, UA, CBC, C met was negative as well.      COGNITION: Overall cognitive status: Difficulty to assess             POSTURE: rounded shoulders and forward head     Cervical ROM:     Active A/PROM (deg) 11/07/2021  Flexion    Extension    Right lateral flexion    Left lateral flexion    Right rotation 50    Left rotation 60  (Blank  rows = not tested)   PATIENT SURVEYS:  FOTO 50     VESTIBULAR ASSESSMENT              GENERAL OBSERVATION: puffiness around eyes                         SYMPTOM BEHAVIOR:                       Subjective history: see above                        Type of dizziness: Spinning/Vertigo                       Frequency: has not occurred for 2 weeks                        Duration: for days                        Aggravating factors: No known aggravating factors                       Relieving factors: no known relieving factors                       Progression of symptoms: better  OCULOMOTOR EXAM:                       Ocular Alignment: normal puffiness around eyes                       Ocular ROM: No Limitations                       Spontaneous Nystagmus: absent                       Gaze-Induced Nystagmus: absent                       Smooth Pursuits: intact                       Saccades: slow                                   Balance:  unable to single leg stance on either leg              VESTIBULAR - OCULAR REFLEX:                                               VOR Cancellation: Normal                       Head-Impulse Test: HIT Right: positive                                  POSITIONAL TESTING: Right Dix-Hallpike: upbeating, right nystagmus; Duration:10 Left Dix-Hallpike: none; Duration: 0               MOTION SENSITIVITY:                         Motion Sensitivity Quotient   Intensity: 0 = none, 1 = Lightheaded, 2 = Mild, 3 = Moderate, 4 = Severe, 5 = Vomiting   Intensity  1. Sitting to supine    2. Supine to L side    3. Supine to R side    4. Supine to sitting    5. L Hallpike-Dix 0  6. Up from L  0  7. R Hallpike-Dix 3  8. Up from R  2  9. Sitting, head  tipped to L knee    10. Head up from L  knee 2  11. Sitting, head  tipped to R knee    12. Head up from R  knee 1  13. Sitting head turns x5 0  14.Sitting head nods x5 0  15. In stance,  180  turn to L     16. In stance, 180  turn to R                VESTIBULAR TREATMENT:   Gaze Adaptation:                       x1 Viewing Horizontal: Reps: 5 and x1 Viewing Vertical:  Position: seated  and Reps: 5 Habituation:  Other: cervical rotation; nose to knee; smooth pursuits vertical, horizontal x 5      PATIENT EDUCATION: Access Code: [email or web address] URL: [email or web address] Date: 11/13/2021 Prepared by: [facility]  Exercises - Seated Gaze Stabilization with Head Rotation  - 2 x daily - 7 x weekly - 1 sets - 10 reps  Education details: tall posture, cervical rotation, smooth pursuits for vertical, horizontal and diagonal  Person educated: Patient and Child(ren) Education method: Explanation, Demonstration, Verbal cues, and Handouts Education comprehension: verbalized understanding and returned demonstration     GOALS: Goals reviewed with patient? No will review next treatment.    SHORT TERM GOALS: Target date: 11/28/2021   Pt to be I in HEP to be able to state no dizziness for the past two weeks  Baseline: see above  Goal status: INITIAL   2.  Pt to be able to single leg stance for three seconds to decrease risk of falling  Baseline: unable  Goal status: INITIAL         LONG TERM GOALS: Target date: 12/19/2021   Pt to be I in HEP to be able to state no sx of dizziness for the four week  Baseline: above  Goal status: INITIAL   2.  PT to be able to single leg stance on each leg for 8 seconds to decrease risk of falls  Baseline: see above  Goal status: INITIAL     ASSESSMENT:   CLINICAL IMPRESSION: This session focused on response to HEP and last visit treatment and then Epley maneuver to the Right. Patient appears to overall report less dizziness. Hard to assess sometimes due to patient aphasia and tendency to close her eyes during treatment.  Therapist progressed HEP.  If dizziness symptoms continue to decreased/ cease  then focus on balance.   Patient will continue to benefit from skilled therapy services to reduce deficits and improve functional level.  OBJECTIVE IMPAIRMENTS decreased balance, decreased ROM, postural dysfunction, and dizziness .    ACTIVITY LIMITATIONS cleaning, community activity, and driving.    PERSONAL FACTORS Age, Behavior pattern, Fitness, and 1 comorbidity: aphasic  are also affecting patient's functional outcome.      REHAB POTENTIAL: Good   CLINICAL DECISION MAKING: Stable/uncomplicated   EVALUATION COMPLEXITY: Moderate     PLAN: PT FREQUENCY: 2x/week   PT DURATION: 6 weeks   PLANNED INTERVENTIONS: Therapeutic exercises, Therapeutic activity, Neuromuscular re-education, Balance training, Patient/Family education, and Manual therapy   PLAN FOR NEXT SESSION: Begin Eply maneuver followed by balance       Education details: tall posture, cervical rotation, smooth pursuits for vertical, horizontal and diagonal  Person educated: Patient and Child(ren) Education method: Explanation, Demonstration, Verbal cues, and Handouts Education comprehension: verbalized understanding and returned demonstration     GOALS: Goals reviewed with patient? No will review next treatment.    SHORT TERM GOALS: Target date: 11/28/2021   Pt to be I in HEP to be able to state no dizziness for the past two weeks  Baseline: see above  Goal status: INITIAL   2.  Pt to be able to single leg stance for three seconds to decrease risk of falling  Baseline: unable  Goal status: INITIAL         LONG TERM GOALS: Target date: 12/19/2021   Pt to be I in HEP to be able to state no sx of dizziness for the four week  Baseline: above  Goal status: INITIAL   2.  PT to be able to single  leg stance on each leg for 8 seconds to decrease risk of falls  Baseline: see above  Goal status: INITIAL     ASSESSMENT:   CLINICAL IMPRESSION: Patient is a 81 y.o. female who was seen today for physical  therapy evaluation and treatment for BPPV following a trip to the dentist.  Evaluation demonstrates Eye exam demonstrates slow response and difficulty following objects, Dix halpike test was positive to the right, postural dysfunction and inability to single leg stance.  Ms. Panik will benefit from skilled PT to address these symptoms and decrease her risk of falling.      OBJECTIVE IMPAIRMENTS decreased balance, decreased ROM, postural dysfunction, and dizziness .    ACTIVITY LIMITATIONS cleaning, community activity, and driving.    PERSONAL FACTORS Age, Behavior pattern, Fitness, and 1 comorbidity: aphasic  are also affecting patient's functional outcome.      REHAB POTENTIAL: Good   CLINICAL DECISION MAKING: Stable/uncomplicated   EVALUATION COMPLEXITY: Moderate     PLAN: PT FREQUENCY: 2x/week   PT DURATION: 6 weeks   PLANNED INTERVENTIONS: Therapeutic exercises, Therapeutic activity, Neuromuscular re-education, Balance training, Patient/Family education, and Manual therapy   PLAN FOR NEXT SESSION: Repeat Eply maneuver followed by balance       1:55 PM, 11/13/21 Anam Bobby Small Nisaiah Bechtol MPT [facility] [phone] [phone]

## 2021-11-14 DIAGNOSIS — J3089 Other allergic rhinitis: Secondary | ICD-10-CM | POA: Diagnosis not present

## 2021-11-14 DIAGNOSIS — J301 Allergic rhinitis due to pollen: Secondary | ICD-10-CM | POA: Diagnosis not present

## 2021-11-14 DIAGNOSIS — J3081 Allergic rhinitis due to animal (cat) (dog) hair and dander: Secondary | ICD-10-CM | POA: Diagnosis not present

## 2021-11-16 ENCOUNTER — Encounter: Payer: Self-pay | Admitting: Family

## 2021-11-16 ENCOUNTER — Ambulatory Visit: Payer: Medicare PPO | Admitting: Family

## 2021-11-16 ENCOUNTER — Ambulatory Visit (HOSPITAL_COMMUNITY): Payer: Medicare PPO | Admitting: Physical Therapy

## 2021-11-16 ENCOUNTER — Encounter (HOSPITAL_COMMUNITY): Payer: Self-pay | Admitting: Physical Therapy

## 2021-11-16 VITALS — BP 139/55 | HR 64 | Temp 97.1°F | Ht 64.0 in | Wt 162.2 lb

## 2021-11-16 DIAGNOSIS — D509 Iron deficiency anemia, unspecified: Secondary | ICD-10-CM | POA: Diagnosis not present

## 2021-11-16 DIAGNOSIS — R42 Dizziness and giddiness: Secondary | ICD-10-CM

## 2021-11-16 DIAGNOSIS — I10 Essential (primary) hypertension: Secondary | ICD-10-CM | POA: Diagnosis not present

## 2021-11-16 DIAGNOSIS — H8111 Benign paroxysmal vertigo, right ear: Secondary | ICD-10-CM | POA: Diagnosis not present

## 2021-11-16 DIAGNOSIS — R2689 Other abnormalities of gait and mobility: Secondary | ICD-10-CM | POA: Diagnosis not present

## 2021-11-16 NOTE — Therapy (Signed)
?OUTPATIENT PHYSICAL THERAPY TREATMENT NOTE ? ? ?Patient Name: Kristi Willis ?MRN: [id number] ?DOB:[birth year], 81 y.o., female ?Today's Date: 11/16/2021 ? ?PCP: Sharion Balloon, FNP ?REFERRING PROVIDER: Sharion Balloon, FNP ? ? PT End of Session - 11/16/21 1407   ? ? Visit Number 3   ? Number of Visits 12   ? Date for PT Re-Evaluation 12/19/21   ? Authorization Type [employer] 12 visits approved for Ther, ex, neuro reed, gt training , self care not cannalith repositioning   ? Progress Note Due on Visit 10   ? PT Start Time 1410   ? PT Stop Time 4196  ? PT Time Calculation (min) 38 min   ? Activity Tolerance Patient tolerated treatment well   ? ?  ?  ? ?  ? ? ?Past Medical History:  ?Diagnosis Date  ? Allergy   ? Asthma   ? Cancer [facility])   ? skin  ? Diabetes mellitus   ? GERD (gastroesophageal reflux disease)   ? hiatal hernia  ? Hypercholesteremia   ? Hypertension   ? Neuromuscular disorder ([place])   ? DM neuropathy  ? Neuropathy   ? SVT (supraventricular tachycardia) ([place])   ? ?Past Surgical History:  ?Procedure Laterality Date  ? BREAST BIOPSY    ? left breast biopsy  ? LUMBAR LAMINECTOMY  08/10  ? SKIN CANCER EXCISION  2005  ? SPINE SURGERY    ? lumbar laminectomy  ? ?Patient Active Problem List  ? Diagnosis Date Noted  ? Pulmonary congestion 02/11/2021  ? Aortic atherosclerosis ([place]) 02/11/2021  ? Hypo-osmolar hyponatremia 02/10/2021  ? Primary progressive aphasia ([place]) 03/23/2020  ? Pain in left shoulder 11/03/2019  ? Acute metabolic encephalopathy 22/29/7989  ? Iron deficiency anemia 08/05/2019  ? QT prolongation 08/05/2019  ? Hypothyroidism 02/12/2019  ? Speech articulation disorder 09/01/2018  ? Essential hypertension 10/03/2017  ? GAD (generalized anxiety disorder) 07/03/2017  ? Diabetes mellitus ([place]) 09/27/2016  ? Depression 03/29/2016  ? Insomnia 07/18/2015  ? Vitamin D deficiency 10/11/2013  ? Right carotid bruit 03/02/2013  ? Degenerative disc disease, lumbar 11/27/2012  ? Asthma 11/13/2010  ?  Hyperlipidemia associated with type 2 diabetes mellitus ([place]) 11/13/2010  ? Cancer of skin 11/13/2010  ? Diabetic neuropathy ([place]) 11/13/2010  ? Wide-complex tachycardia==Atrial Tachycardia 11/13/2010  ? ? ?REFERRING DIAG: BPPV ? ?THERAPY DIAG:  ?BPPV (benign paroxysmal positional vertigo), right ? ?PERTINENT HISTORY: aphasia ? ?PRECAUTIONS: fall ? ?SUBJECTIVE: PAIN:  ?Are you having pain? No ?Pt is non verbal, daughter states that eye exercises seem to be helping.  ? ?11/16/2021 ? ?OBJECTIVE:   Marye Round (-) both to the Right and left but pt complains of dizziness coming up from the right. ?                        Nestor Lewandowsky x 5 with good results. ?                        Self manual lymph drainage techniques to decrease noted congestion around B eyes Lt greater than RT.   ? ?                        Sit to stand with increased dizziness.  ? ? ?  ? ?            ?VESTIBULAR TREATMENT: ? ?11/13/21 ? ?Epley maneuver to the Right x  2.  Patient has some difficulty following commands the first trial and unsure of patient assessment of her dizziness however she states clearly the 2nd time she has some dizziness that lasts only a few seconds to the Right and then no dizziness with head turn to the Left or with sitting up.  Therapist also added gaze stabilization with head turns today.  ? EVAL ?Gaze Adaptation: ?                      x1 Viewing Horizontal: Reps: 5 and x1 Viewing Vertical:  Position: seated  and Reps: 5 ?Habituation: ?                      Other: cervical rotation; nose to knee; smooth pursuits vertical, horizontal x 5  ?  ?  ?PATIENT EDUCATION: ?11/13/21 ?OBJECTIVE:  ?  ?DIAGNOSTIC FINDINGS: a CT was performed on 09/26/2021, with negative acute findings. Other work-up including EKG, UA, CBC, C met was negative as well.  ?  ?  ?COGNITION: ?Overall cognitive status: Difficulty to assess ?            ?POSTURE: rounded shoulders and forward head ?  ?  ?Cervical ROM:   ?  ?Active A/PROM (deg) ?11/07/2021   ?Flexion    ?Extension    ?Right lateral flexion    ?Left lateral flexion    ?Right rotation 50 ?   ?Left rotation 60  ?(Blank rows = not tested) ?  ?PATIENT SURVEYS:  ?FOTO 50 ?  ?  ?VESTIBULAR ASSESSMENT ?  ?           GENERAL OBSERVATION: puffiness around eyes  ?            ?           SYMPTOM BEHAVIOR: ?                      Subjective history: see above  ?                      Type of dizziness: Spinning/Vertigo ?                      Frequency: has not occurred for 2 weeks  ?                      Duration: for days  ?                      Aggravating factors: No known aggravating factors ?                      Relieving factors: no known relieving factors ?                      Progression of symptoms: better ?  ?           OCULOMOTOR EXAM: ?                      Ocular Alignment: normal puffiness around eyes ?                      Ocular ROM: No Limitations ?                      Spontaneous Nystagmus: absent ?  Gaze-Induced Nystagmus: absent ?                      Smooth Pursuits: intact ?                      Saccades: slow ?                       ?           Balance:  unable to single leg stance on either leg ?  ?           VESTIBULAR - OCULAR REFLEX:  ?                       ?                      VOR Cancellation: Normal ?                      Head-Impulse Test: HIT Right: positive ?                                 POSITIONAL TESTING: Right Dix-Hallpike: upbeating, right nystagmus; Duration:10 ?Left Dix-Hallpike: none; Duration: 0 ?            ?  ?MOTION SENSITIVITY: ?  ?                      Motion Sensitivity Quotient ?  ?Intensity: 0 = none, 1 = Lightheaded, 2 = Mild, 3 = Moderate, 4 = Severe, 5 = Vomiting ?  Intensity  ?1. Sitting to supine    ?2. Supine to L side    ?3. Supine to R side    ?4. Supine to sitting    ?5. L Hallpike-Dix 0  ?6. Up from L  0  ?7. R Hallpike-Dix 3  ?8. Up from R  2  ?9. Sitting, head  ?tipped to L knee    ?10. Head up from L  ?knee 2  ?11. Sitting,  head  ?tipped to R knee    ?12. Head up from R  ?knee 1  ?13. Sitting head turns x5 0  ?14.Sitting head nods x5 0  ?15. In stance, 180?  ?turn to L     ?16. In stance, 180?  ?turn to R    ?            ?VESTIBULAR TREATMENT: ?  ?Gaze Adaptation: ?                      x1 Viewing Horizontal: Reps: 5 and x1 Viewing Vertical:  Position: seated  and Reps: 5 ?Habituation: ?                      Other: cervical rotation; nose to knee; smooth pursuits vertical, horizontal x 5  ?  ?  ?PATIENT EDUCATION: ?Access Code: [email or web address] ?URL: [email or web address] ?Date: 11/13/2021 ?Prepared by: [facility] ? ?Exercises ?- Seated Gaze Stabilization with Head Rotation  - 2 x daily - 7 x weekly - 1 sets - 10 reps ? ?Education details: tall posture, cervical rotation, smooth pursuits for vertical, horizontal and diagonal ; 3/31 ? Pt daughter notes dizziness tends to be in the morning when she gets up, therapist  suggests that patient sit for a minute prior to standing in the morning.  Therapist educated pt on self manual techniques to decrease edema around eyes.  ?Person educated: Patient and Child(ren) ?Education method: Explanation, Demonstration, Verbal cues, and Handouts ?Education comprehension: verbalized understanding and returned demonstration ?  ?  ?GOALS: ?Goals reviewed with patient? No will review next treatment.  ?  ?SHORT TERM GOALS: Target date: 11/28/2021 ?  ?Pt to be I in HEP to be able to state no dizziness for the past two weeks  ?Baseline: see above  ?Goal status: INITIAL ?  ?2.  Pt to be able to single leg stance for three seconds to decrease risk of falling  ?Baseline: unable  ?Goal status: INITIAL ?  ?  ?  ?  ?LONG TERM GOALS: Target date: 12/19/2021 ?  ?Pt to be I in HEP to be able to state no sx of dizziness for the four week  ?Baseline: above  ?Goal status: INITIAL ?  ?2.  PT to be able to single leg stance on each leg for 8 seconds to decrease risk of falls  ?Baseline: see above  ?Goal status: INITIAL ?   ?  ?ASSESSMENT: ?  ?CLINICAL IMPRESSION: ?This session focused on habituation exercises as Dix-hallpike was performed twice with both times being (-).  PT did complain of increased dizziness with coming up for

## 2021-11-16 NOTE — Patient Instructions (Signed)
Iron Deficiency Anemia, Adult Iron deficiency anemia is a condition in which the concentration of red blood cells or hemoglobin in the blood is below normal because of too little iron. Hemoglobin is a substance in red blood cells that carries oxygen to the body's tissues. When the concentration of red blood cells or hemoglobin is too low, not enough oxygen reaches these tissues. Iron deficiency anemia is usually long-lasting, and it develops over time. It may or may not cause symptoms. It is a common type of anemia. What are the causes? This condition may be caused by: Not enough iron in the diet. Abnormal absorption in the gut. Increased need for iron because of pregnancy or heavy menstrual periods, for females. Cancers of the gastrointestinal system, such as colon cancer. Blood loss caused by bleeding in the intestine. This may be from a gastrointestinal condition like Crohn's disease. Frequent blood draws, such as from blood donation. What increases the risk? The following factors may make you more likely to develop this condition: Being pregnant. Being a teenage girl going through a growth spurt. What are the signs or symptoms? Symptoms of this condition may include: Pale skin, lips, and nail beds. Weakness, dizziness, and getting tired easily. Headache. Shortness of breath when moving or exercising. Cold hands and feet. Fast or irregular heartbeat. Irritability or rapid breathing. These are more common in severe anemia. Mild anemia may not cause any symptoms. How is this diagnosed? This condition is diagnosed based on: Your medical history. A physical exam. Blood tests. You may have additional tests to find the underlying cause of your anemia, such as: Testing for blood in the stool (fecal occult blood test). A procedure to see inside your colon and rectum (colonoscopy). A procedure to see inside your esophagus and stomach (endoscopy). A test in which cells are removed from  bone marrow (bone marrow aspiration) or fluid is removed from the bone marrow to be examined. This is rarely needed. How is this treated? This condition is treated by correcting the cause of your iron deficiency. Treatment may involve: Adding iron-rich foods to your diet. Taking iron supplements. If you are pregnant or breastfeeding, you may need to take extra iron because your normal diet usually does not provide the amount of iron that you need. Increasing vitamin C intake. Vitamin C helps your body absorb iron. Your health care provider may recommend that you take iron supplements along with a glass of orange juice or a vitamin C supplement. Medicines to make heavy menstrual flow lighter. Surgery. You may need repeat blood tests to determine whether treatment is working. If the treatment does not seem to be working, you may need more tests. Follow these instructions at home: Medicines Take over-the-counter and prescription medicines only as told by your health care provider. This includes iron supplements and vitamins. For the best iron absorption, you should take iron supplements when your stomach is empty. If you cannot tolerate them on an empty stomach, you may need to take them with food. Do not drink milk or take antacids at the same time as your iron supplements. Milk and antacids may interfere with iron absorption. Iron supplements may turn stool (feces) a darker color and it may appear black. If you cannot tolerate taking iron supplements by mouth, talk with your health care provider about taking them through an IV or through an injection into a muscle. Eating and drinking  Talk with your health care provider before changing your diet. He or she may recommend   that you eat foods that contain a lot of iron, such as: Liver. Low-fat (lean) beef. Breads and cereals that have iron added to them (are fortified). Eggs. Dried fruit. Dark green, leafy vegetables. To help your body use the  iron from iron-rich foods, eat those foods at the same time as fresh fruits and vegetables that are high in vitamin C. Foods that are high in vitamin C include: Oranges. Peppers. Tomatoes. Mangoes. Drink enough fluid to keep your urine pale yellow. Managing constipation If you are taking an iron supplement, it may cause constipation. To prevent or treat constipation, you may need to: Take over-the-counter or prescription medicines. Eat foods that are high in fiber, such as beans, whole grains, and fresh fruits and vegetables. Limit foods that are high in fat and processed sugars, such as fried or sweet foods. General instructions Return to your normal activities as told by your health care provider. Ask your health care provider what activities are safe for you. Practice good hygiene. Anemia can make you more prone to illness and infection. Keep all follow-up visits as told by your health care provider. This is important. Contact a health care provider if you: Feel nauseous or you vomit. Feel weak. Have unexplained sweating. Develop symptoms of constipation, such as: Having fewer than three bowel movements a week. Straining to have a bowel movement. Having stools that are hard, dry, or larger than normal. Feeling full or bloated. Pain in the lower abdomen. Not feeling relief after having a bowel movement. Get help right away if you: Faint. If this happens, do not drive yourself to the hospital. Have chest pain. Have shortness of breath that: Is severe. Gets worse with physical activity. Have an irregular or rapid heartbeat. Become light-headed when getting up from a sitting or lying down position. These symptoms may represent a serious problem that is an emergency. Do not wait to see if the symptoms will go away. Get medical help right away. Call your local emergency services (911 in the [place] Do not drive yourself to the hospital. Summary Iron deficiency anemia is a condition in  which the concentration of red blood cells or hemoglobin in the blood is below normal because of too little iron. This condition is treated by correcting the cause of your iron deficiency. Take over-the-counter and prescription medicines only as told by your health care provider. This includes iron supplements and vitamins. To help your body use the iron from iron-rich foods, eat those foods at the same time as fresh fruits and vegetables that are high in vitamin C. Get help right away if you have shortness of breath that gets worse with physical activity. This information is not intended to replace advice given to you by your health care provider. Make sure you discuss any questions you have with your health care provider. Document Revised: 04/13/2019 Document Reviewed: 04/13/2019 Elsevier Patient Education  2022 Elsevier Inc.  

## 2021-11-16 NOTE — Progress Notes (Signed)
? ?Subjective:  ? ? Patient ID: Kristi Willis, female    DOB: 01-Feb-1941, 81 y.o.   MRN: [id number] ? ? ?Chief Complaint  ?Patient presents with  ? Follow-up  ? ?PT presents to the office today to follow up on iron deficiency anemia. She is taking iron BID and states she is feeling better.  ? ?She is working with PT with vertigo. That has improved.  ?Anemia ?Presents for follow-up visit. Symptoms include malaise/fatigue. There has been no bruising/bleeding easily, confusion, leg swelling or light-headedness.  ?Hypertension ?This is a chronic problem. The current episode started more than 1 year ago. The problem has been resolved since onset. The problem is controlled. Associated symptoms include malaise/fatigue. Pertinent negatives include no peripheral edema or shortness of breath. Risk factors for coronary artery disease include dyslipidemia and sedentary lifestyle. The current treatment provides moderate improvement.  ?Dizziness ?This is a chronic problem. The current episode started more than 1 year ago. The problem occurs intermittently. The problem has been gradually improving.  ? ? ? ?Review of Systems  ?Constitutional:  Positive for malaise/fatigue.  ?Respiratory:  Negative for shortness of breath.   ?Neurological:  Positive for dizziness. Negative for light-headedness.  ?Hematological:  Does not bruise/bleed easily.  ?Psychiatric/Behavioral:  Negative for confusion.   ?All other systems reviewed and are negative. ? ?   ?Objective:  ? Physical Exam ?Vitals reviewed.  ?Constitutional:   ?   General: She is not in acute distress. ?   Appearance: She is well-developed.  ?HENT:  ?   Head: Normocephalic and atraumatic.  ?Eyes:  ?   Pupils: Pupils are equal, round, and reactive to light.  ?Neck:  ?   Thyroid: No thyromegaly.  ?Cardiovascular:  ?   Rate and Rhythm: Normal rate and regular rhythm.  ?   Heart sounds: Normal heart sounds. No murmur heard. ?Pulmonary:  ?   Effort: Pulmonary effort is normal. No  respiratory distress.  ?   Breath sounds: Normal breath sounds. No wheezing.  ?Abdominal:  ?   General: Bowel sounds are normal. There is no distension.  ?   Palpations: Abdomen is soft.  ?   Tenderness: There is no abdominal tenderness.  ?Musculoskeletal:     ?   General: No tenderness. Normal range of motion.  ?   Cervical back: Normal range of motion and neck supple.  ?Skin: ?   General: Skin is warm and dry.  ?Neurological:  ?   Mental Status: She is alert and oriented to person, place, and time.  ?   Cranial Nerves: No cranial nerve deficit.  ?   Deep Tendon Reflexes: Reflexes are normal and symmetric.  ?Psychiatric:     ?   Behavior: Behavior normal.     ?   Thought Content: Thought content normal.     ?   Judgment: Judgment normal.  ? ? ? ? ?BP (!) 139/55   Pulse 64   Temp (!) 97.1 ?F (36.2 ?C) (Temporal)   Ht _0  (1.626 m)   Wt 162 lb 3.2 oz (73.6 kg)   BMI 27.84 kg/m?  ? ?   ?Assessment & Plan:  ?Kristi Willis comes in today with chief complaint of Follow-up ? ? ?Diagnosis and orders addressed: ? ?1. Essential hypertension ?- BMP8+EGFR ? ?2. Iron deficiency anemia, unspecified iron deficiency anemia type ?Labs pending  ?Continue Iron BID  ?Iron diet encouraged ?- Anemia Profile B ?- BMP8+EGFR ? ?3. Vertigo ?Continue with PT ?Fall precautions  ?-  BMP8+EGFR ? ? ?Labs pending ?Health Maintenance reviewed ?Diet and exercise encouraged ? ?Follow up plan: ?Keep chronic follow up.  ? ? ?Kristi Dun, FNP ? ? ? ?

## 2021-11-17 LAB — ANEMIA PROFILE B
Basophils Absolute: 0 10*3/uL (ref 0.0–0.2)
Basos: 0 %
EOS (ABSOLUTE): 0.4 10*3/uL (ref 0.0–0.4)
Eos: 4 %
Ferritin: 37 ng/mL (ref 15–150)
Folate: >20 ng/mL (ref >3.0)
Hematocrit: 33.4 % — ABNORMAL LOW (ref 34.0–46.6)
Hemoglobin: 10.5 g/dL — ABNORMAL LOW (ref 11.1–15.9)
Immature Grans (Abs): 0 10*3/uL (ref 0.0–0.1)
Immature Granulocytes: 0 %
Iron Saturation: 12 % — ABNORMAL LOW (ref 15–55)
Iron: 42 ug/dL (ref 27–139)
Lymphocytes Absolute: 2.2 10*3/uL (ref 0.7–3.1)
Lymphs: 23 %
MCH: 26.7 pg (ref 26.6–33.0)
MCHC: 31.4 g/dL — ABNORMAL LOW (ref 31.5–35.7)
MCV: 85 fL (ref 79–97)
Monocytes Absolute: 0.8 10*3/uL (ref 0.1–0.9)
Monocytes: 8 %
Neutrophils Absolute: 6 10*3/uL (ref 1.4–7.0)
Neutrophils: 65 %
Platelets: 274 10*3/uL (ref 150–450)
RBC: 3.93 x10E6/uL (ref 3.77–5.28)
RDW: 14.2 % (ref 11.7–15.4)
Retic Ct Pct: 1.8 % (ref 0.6–2.6)
Total Iron Binding Capacity: 355 ug/dL (ref 250–450)
UIBC: 313 ug/dL (ref 118–369)
Vitamin B-12: 738 pg/mL (ref 232–1245)
WBC: 9.4 10*3/uL (ref 3.4–10.8)

## 2021-11-17 LAB — BMP8+EGFR
BUN/Creatinine Ratio: 19 (ref 12–28)
BUN: 11 mg/dL (ref 8–27)
CO2: 26 mmol/L (ref 20–29)
Calcium: 9.6 mg/dL (ref 8.7–10.3)
Chloride: 102 mmol/L (ref 96–106)
Creatinine, Ser: 0.58 mg/dL (ref 0.57–1.00)
Glucose: 182 mg/dL — ABNORMAL HIGH (ref 70–99)
Potassium: 4.1 mmol/L (ref 3.5–5.2)
Sodium: 141 mmol/L (ref 134–144)
eGFR: 91 mL/min/{1.73_m2} (ref >59)

## 2021-11-20 DIAGNOSIS — J301 Allergic rhinitis due to pollen: Secondary | ICD-10-CM | POA: Diagnosis not present

## 2021-11-20 DIAGNOSIS — J3089 Other allergic rhinitis: Secondary | ICD-10-CM | POA: Diagnosis not present

## 2021-11-20 DIAGNOSIS — J3081 Allergic rhinitis due to animal (cat) (dog) hair and dander: Secondary | ICD-10-CM | POA: Diagnosis not present

## 2021-11-21 ENCOUNTER — Telehealth (HOSPITAL_COMMUNITY): Payer: Self-pay

## 2021-11-22 ENCOUNTER — Encounter (HOSPITAL_COMMUNITY): Payer: Medicare PPO

## 2021-11-23 ENCOUNTER — Encounter (HOSPITAL_COMMUNITY): Payer: Self-pay | Admitting: Physical Therapy

## 2021-11-23 ENCOUNTER — Ambulatory Visit (HOSPITAL_COMMUNITY): Payer: Medicare PPO | Attending: Physician Assistant | Admitting: Physical Therapy

## 2021-11-23 DIAGNOSIS — R2689 Other abnormalities of gait and mobility: Secondary | ICD-10-CM | POA: Insufficient documentation

## 2021-11-23 DIAGNOSIS — H8111 Benign paroxysmal vertigo, right ear: Secondary | ICD-10-CM | POA: Insufficient documentation

## 2021-11-23 NOTE — Therapy (Signed)
?OUTPATIENT PHYSICAL THERAPY TREATMENT NOTE ? ? ?Patient Name: Kristi Willis ?MRN: [id number] ?DOB:Aug 12, 1941, 81 y.o., female ?Today's Date: 11/23/2021 ? ?PCP: Sharion Balloon, FNP ?REFERRING PROVIDER: Sharion Balloon, FNP ?PHYSICAL THERAPY DISCHARGE SUMMARY ? ?Visits from Start of Care: 4 ? ?Current functional level related to goals / functional outcomes: ?Very difficult to assess but pt is (-) to all maneuvers ?clearing out BPPV, pt has functional balance ?  ?Remaining deficits: ?Occasional dizziness in the morning  ?  ?Education / Equipment: ?HEP  ? ?Patient agrees to discharge. Patient goals were partially met. Patient is being discharged due to did not respond to therapy.   ? PT End of Session - 11/16/21 1407   ? ? Visit Number 4  ? Number of Visits 12   ? Date for PT Re-Evaluation 12/19/21   ? Authorization Type [employer] 12 visits approved for Ther, ex, neuro reed, gt training , self care not cannalith repositioning   ? Progress Note Due on Visit 10   ? PT Start Time 1535  ? PT Stop Time 1615  ? PT Time Calculation (min) 38 min   ? Activity Tolerance Patient tolerated treatment well   ? ?  ?  ? ?  ? ? ?Past Medical History:  ?Diagnosis Date  ? Allergy   ? Asthma   ? Cancer [facility])   ? skin  ? Diabetes mellitus   ? GERD (gastroesophageal reflux disease)   ? hiatal hernia  ? Hypercholesteremia   ? Hypertension   ? Neuromuscular disorder ([place])   ? DM neuropathy  ? Neuropathy   ? SVT (supraventricular tachycardia) ([place])   ? ?Past Surgical History:  ?Procedure Laterality Date  ? BREAST BIOPSY    ? left breast biopsy  ? LUMBAR LAMINECTOMY  08/10  ? SKIN CANCER EXCISION  2005  ? SPINE SURGERY    ? lumbar laminectomy  ? ?Patient Active Problem List  ? Diagnosis Date Noted  ? Pulmonary congestion 02/11/2021  ? Aortic atherosclerosis ([place]) 02/11/2021  ? Hypo-osmolar hyponatremia 02/10/2021  ? Primary progressive aphasia ([place]) 03/23/2020  ? Pain in left shoulder 11/03/2019  ? Acute metabolic encephalopathy  00/37/0488  ? Iron deficiency anemia 08/05/2019  ? QT prolongation 08/05/2019  ? Hypothyroidism 02/12/2019  ? Speech articulation disorder 09/01/2018  ? Essential hypertension 10/03/2017  ? GAD (generalized anxiety disorder) 07/03/2017  ? Diabetes mellitus ([place]) 09/27/2016  ? Depression 03/29/2016  ? Insomnia 07/18/2015  ? Vitamin D deficiency 10/11/2013  ? Right carotid bruit 03/02/2013  ? Degenerative disc disease, lumbar 11/27/2012  ? Asthma 11/13/2010  ? Hyperlipidemia associated with type 2 diabetes mellitus ([place]) 11/13/2010  ? Cancer of skin 11/13/2010  ? Diabetic neuropathy ([place]) 11/13/2010  ? Wide-complex tachycardia==Atrial Tachycardia 11/13/2010  ? ? ?REFERRING DIAG: BPPV ? ?THERAPY DIAG:  ?BPPV (benign paroxysmal positional vertigo), right ? ?Balance disorder ? ?PERTINENT HISTORY: aphasia ? ?PRECAUTIONS: fall ? ?SUBJECTIVE: PT states that she is not dizzy. ?Are you having pain? No ?Pt is non verbal, daughter states that eye exercises seem to be helping.  ?11/23/2021 ?DIx halpike (-) both to Rt and lt with no sx vocalized upon return.   ?Head turns while sitting and standing (-) to dizziness ?Sit to stand x 5 (-) for dizziness  ?Standing 1/4 turn to RT mild  ?Standing 1/4 turn to Lt mild  ? ?Cervical and scapular retraction x 10 reps ?Cervical rotation is now 65  degrees B . ? ?Pt has difficulty single leg standing however  therapist noted pt leaning down  ?To put her umbrella on the floor and return without loss of balance.   ? ?Assessment:  Pt had no dizziness with motion or Dix halpike maneuver. Pt ambulating without assistive device without balance issues. Able to reach to the floor and return without balance issues.  Pt will be discharge with HEP as skilled therapy is not needed at this time. ? ?Plan:  Discharge  ? ? ? ?11/16/2021 ?OBJECTIVE:   Marye Round (-) both to the Right and left but pt complains of dizziness coming up from the right. ?                        Nestor Lewandowsky x 5 with good results. ?                         Self manual lymph drainage techniques to decrease noted congestion around B eyes Lt greater than RT.   ? ?                        Sit to stand with increased dizziness.  ? ?Assessment  ?This session focused on habituation exercises as Dix-hallpike was performed twice with both times being (-).  PT did complain of increased dizziness with coming up for sidelying position therefore therapist added Laruth Bouchard daroff exercises to program.  Pt daughter notes dizziness tends to be in the morning when she gets up, therapist suggests that patient sit for a minute prior to standing in the morning.  Therapist educated pt on self manual techniques to decrease edema around eyes ?             ?  ?GOALS: ?Goals reviewed with patient? No will review next treatment.  ?  ?SHORT TERM GOALS: Target date: 11/28/2021 ?  ?Pt to be I in HEP to be able to state no dizziness for the past two weeks  ?Baseline: see above  ?Goal status: not met  ?  ?2.  Pt to be able to single leg stance for three seconds to decrease risk of falling  ?Baseline: unable  ?Goal status: met  ?  ?  ?  ?  ?LONG TERM GOALS: Target date: 12/19/2021 ?  ?Pt to be I in HEP to be able to state no sx of dizziness for the four week  ?Baseline: above  ?Goal status: not met  ?  ?2.  PT to be able to single leg stance on each leg for 8 seconds to decrease risk of falls  ?Baseline: see above  ?Goal status: not met ? ? ?11/13/21 ? ?Epley maneuver to the Right x 2.  Patient has some difficulty following commands the first trial and unsure of patient assessment of her dizziness however she states clearly the 2nd time she has some dizziness that lasts only a few seconds to the Right and then no dizziness with head turn to the Left or with sitting up.  Therapist also added gaze stabilization with head turns today.  ? EVAL ?Gaze Adaptation: ?                      x1 Viewing Horizontal: Reps: 5 and x1 Viewing Vertical:  Position: seated  and Reps: 5 ?Habituation: ?                       Other: cervical rotation; nose  to knee; smooth pursuits vertical, horizontal x 5  ?  ?  ?PATIENT EDUCATION: ?11/13/21 ?OBJECTIVE:  ?  ?DIAGNOSTIC FINDINGS: a CT was performed on 09/26/2021, with negative acute findings. Other work-up including EKG, UA, CBC, C met was negative as well.  ?  ?  ?COGNITION: ?Overall cognitive status: Difficulty to assess ?            ?POSTURE: rounded shoulders and forward head ?  ?  ?Cervical ROM:   ?  ?Active A/PROM (deg) ?11/07/2021  ?Flexion    ?Extension    ?Right lateral flexion    ?Left lateral flexion    ?Right rotation 50 ?   ?Left rotation 60  ?(Blank rows = not tested) ?  ?PATIENT SURVEYS:  ?FOTO 50 ?  ?  ?VESTIBULAR ASSESSMENT ?  ?           GENERAL OBSERVATION: puffiness around eyes  ?            ?           SYMPTOM BEHAVIOR: ?                      Subjective history: see above  ?                      Type of dizziness: Spinning/Vertigo ?                      Frequency: has not occurred for 2 weeks  ?                      Duration: for days  ?                      Aggravating factors: No known aggravating factors ?                      Relieving factors: no known relieving factors ?                      Progression of symptoms: better ?  ?           OCULOMOTOR EXAM: ?                      Ocular Alignment: normal puffiness around eyes ?                      Ocular ROM: No Limitations ?                      Spontaneous Nystagmus: absent ?                      Gaze-Induced Nystagmus: absent ?                      Smooth Pursuits: intact ?                      Saccades: slow ?                       ?           Balance:  unable to single leg stance on either leg ?  ?           VESTIBULAR - OCULAR  REFLEX:  ?                       ?                      VOR Cancellation: Normal ?                      Head-Impulse Test: HIT Right: positive ?                                 POSITIONAL TESTING: Right Dix-Hallpike: upbeating, right nystagmus; Duration:10 ?Left  Dix-Hallpike: none; Duration: 0 ?            ?  ?MOTION SENSITIVITY: ?  ?                      Motion Sensitivity Quotient ?  ?Intensity: 0 = none, 1 = Lightheaded, 2 = Mild, 3 = Moderate, 4 = Severe, 5 = Vomitin

## 2021-11-26 NOTE — Addendum Note (Signed)
Addended by: Leeroy Cha on: 11/26/2021 10:33 AM ? ? Modules accepted: Orders ? ?

## 2021-11-28 ENCOUNTER — Ambulatory Visit (HOSPITAL_COMMUNITY): Payer: Medicare PPO | Admitting: Physical Therapy

## 2021-11-29 DIAGNOSIS — J3081 Allergic rhinitis due to animal (cat) (dog) hair and dander: Secondary | ICD-10-CM | POA: Diagnosis not present

## 2021-11-29 DIAGNOSIS — J301 Allergic rhinitis due to pollen: Secondary | ICD-10-CM | POA: Diagnosis not present

## 2021-11-29 DIAGNOSIS — J3089 Other allergic rhinitis: Secondary | ICD-10-CM | POA: Diagnosis not present

## 2021-12-03 ENCOUNTER — Encounter (HOSPITAL_COMMUNITY): Payer: Medicare PPO

## 2021-12-05 DIAGNOSIS — J3089 Other allergic rhinitis: Secondary | ICD-10-CM | POA: Diagnosis not present

## 2021-12-05 DIAGNOSIS — J301 Allergic rhinitis due to pollen: Secondary | ICD-10-CM | POA: Diagnosis not present

## 2021-12-05 DIAGNOSIS — J3081 Allergic rhinitis due to animal (cat) (dog) hair and dander: Secondary | ICD-10-CM | POA: Diagnosis not present

## 2021-12-07 ENCOUNTER — Encounter (HOSPITAL_COMMUNITY): Payer: Medicare PPO

## 2021-12-11 DIAGNOSIS — E1142 Type 2 diabetes mellitus with diabetic polyneuropathy: Secondary | ICD-10-CM | POA: Diagnosis not present

## 2021-12-11 DIAGNOSIS — L84 Corns and callosities: Secondary | ICD-10-CM | POA: Diagnosis not present

## 2021-12-11 DIAGNOSIS — B351 Tinea unguium: Secondary | ICD-10-CM | POA: Diagnosis not present

## 2021-12-11 DIAGNOSIS — M79676 Pain in unspecified toe(s): Secondary | ICD-10-CM | POA: Diagnosis not present

## 2021-12-14 ENCOUNTER — Encounter (HOSPITAL_COMMUNITY): Payer: Medicare PPO | Admitting: Physical Therapy

## 2021-12-14 DIAGNOSIS — J301 Allergic rhinitis due to pollen: Secondary | ICD-10-CM | POA: Diagnosis not present

## 2021-12-14 DIAGNOSIS — J3089 Other allergic rhinitis: Secondary | ICD-10-CM | POA: Diagnosis not present

## 2021-12-14 DIAGNOSIS — J3081 Allergic rhinitis due to animal (cat) (dog) hair and dander: Secondary | ICD-10-CM | POA: Diagnosis not present

## 2021-12-17 ENCOUNTER — Encounter (HOSPITAL_COMMUNITY): Payer: Medicare PPO | Admitting: Physical Therapy

## 2021-12-20 DIAGNOSIS — J301 Allergic rhinitis due to pollen: Secondary | ICD-10-CM | POA: Diagnosis not present

## 2021-12-20 DIAGNOSIS — J3089 Other allergic rhinitis: Secondary | ICD-10-CM | POA: Diagnosis not present

## 2021-12-20 DIAGNOSIS — J3081 Allergic rhinitis due to animal (cat) (dog) hair and dander: Secondary | ICD-10-CM | POA: Diagnosis not present

## 2021-12-21 ENCOUNTER — Encounter (HOSPITAL_COMMUNITY): Payer: Medicare PPO | Admitting: Physical Therapy

## 2021-12-24 ENCOUNTER — Encounter (HOSPITAL_COMMUNITY): Payer: Medicare PPO | Admitting: Physical Therapy

## 2021-12-25 NOTE — Telephone Encounter (Signed)
Pt stated she has another appointment the same time  ?

## 2021-12-27 DIAGNOSIS — J3081 Allergic rhinitis due to animal (cat) (dog) hair and dander: Secondary | ICD-10-CM | POA: Diagnosis not present

## 2021-12-27 DIAGNOSIS — J3089 Other allergic rhinitis: Secondary | ICD-10-CM | POA: Diagnosis not present

## 2021-12-27 DIAGNOSIS — J301 Allergic rhinitis due to pollen: Secondary | ICD-10-CM | POA: Diagnosis not present

## 2021-12-28 ENCOUNTER — Encounter (HOSPITAL_COMMUNITY): Payer: Medicare PPO | Admitting: Physical Therapy

## 2021-12-31 ENCOUNTER — Encounter (HOSPITAL_COMMUNITY): Payer: Medicare PPO | Admitting: Physical Therapy

## 2022-01-01 DIAGNOSIS — J3081 Allergic rhinitis due to animal (cat) (dog) hair and dander: Secondary | ICD-10-CM | POA: Diagnosis not present

## 2022-01-01 DIAGNOSIS — J3089 Other allergic rhinitis: Secondary | ICD-10-CM | POA: Diagnosis not present

## 2022-01-01 DIAGNOSIS — J301 Allergic rhinitis due to pollen: Secondary | ICD-10-CM | POA: Diagnosis not present

## 2022-01-04 ENCOUNTER — Encounter (HOSPITAL_COMMUNITY): Payer: Medicare PPO | Admitting: Physical Therapy

## 2022-01-07 ENCOUNTER — Encounter (HOSPITAL_COMMUNITY): Payer: Medicare PPO

## 2022-01-09 DIAGNOSIS — J3089 Other allergic rhinitis: Secondary | ICD-10-CM | POA: Diagnosis not present

## 2022-01-09 DIAGNOSIS — J3081 Allergic rhinitis due to animal (cat) (dog) hair and dander: Secondary | ICD-10-CM | POA: Diagnosis not present

## 2022-01-09 DIAGNOSIS — J301 Allergic rhinitis due to pollen: Secondary | ICD-10-CM | POA: Diagnosis not present

## 2022-01-17 DIAGNOSIS — J301 Allergic rhinitis due to pollen: Secondary | ICD-10-CM | POA: Diagnosis not present

## 2022-01-17 DIAGNOSIS — J3089 Other allergic rhinitis: Secondary | ICD-10-CM | POA: Diagnosis not present

## 2022-01-17 DIAGNOSIS — J3081 Allergic rhinitis due to animal (cat) (dog) hair and dander: Secondary | ICD-10-CM | POA: Diagnosis not present

## 2022-01-21 DIAGNOSIS — E119 Type 2 diabetes mellitus without complications: Secondary | ICD-10-CM | POA: Diagnosis not present

## 2022-01-21 DIAGNOSIS — Z7984 Long term (current) use of oral hypoglycemic drugs: Secondary | ICD-10-CM | POA: Diagnosis not present

## 2022-01-21 DIAGNOSIS — E1136 Type 2 diabetes mellitus with diabetic cataract: Secondary | ICD-10-CM | POA: Diagnosis not present

## 2022-01-21 DIAGNOSIS — H18513 Endothelial corneal dystrophy, bilateral: Secondary | ICD-10-CM | POA: Diagnosis not present

## 2022-01-21 DIAGNOSIS — H18523 Epithelial (juvenile) corneal dystrophy, bilateral: Secondary | ICD-10-CM | POA: Diagnosis not present

## 2022-01-21 DIAGNOSIS — Z961 Presence of intraocular lens: Secondary | ICD-10-CM | POA: Diagnosis not present

## 2022-01-23 ENCOUNTER — Ambulatory Visit: Payer: Medicare PPO | Admitting: Family Medicine

## 2022-01-23 ENCOUNTER — Encounter: Payer: Self-pay | Admitting: Family Medicine

## 2022-01-23 VITALS — BP 156/57 | HR 64 | Temp 97.9°F | Ht 64.0 in | Wt 163.0 lb

## 2022-01-23 DIAGNOSIS — G3101 Pick's disease: Secondary | ICD-10-CM

## 2022-01-23 DIAGNOSIS — L03116 Cellulitis of left lower limb: Secondary | ICD-10-CM | POA: Diagnosis not present

## 2022-01-23 DIAGNOSIS — F0280 Dementia in other diseases classified elsewhere without behavioral disturbance: Secondary | ICD-10-CM

## 2022-01-23 DIAGNOSIS — R42 Dizziness and giddiness: Secondary | ICD-10-CM | POA: Diagnosis not present

## 2022-01-23 DIAGNOSIS — H60502 Unspecified acute noninfective otitis externa, left ear: Secondary | ICD-10-CM | POA: Diagnosis not present

## 2022-01-23 MED ORDER — CEPHALEXIN 500 MG PO CAPS: 500.0000 mg | ORAL_CAPSULE | Freq: Two times a day (BID) | ORAL | 0 refills | Status: DC

## 2022-01-23 NOTE — Patient Instructions (Signed)
Otitis Externa  Otitis externa is an infection of the outer ear canal. The outer ear canal is the area between the outside of the ear and the eardrum. Otitis externa is sometimes called swimmer's ear. What are the causes? Common causes of this condition include: Swimming in dirty water. Moisture in the ear. An injury to the inside of the ear. An object stuck in the ear. A cut or scrape on the outside of the ear or in the ear canal. What increases the risk? You are more likely to develop this condition if you go swimming often. What are the signs or symptoms? The first symptom of this condition is often itching in the ear. Later symptoms of the condition include: Swelling of the ear. Redness in the ear. Ear pain. The pain may get worse when you pull on your ear. Pus coming from the ear. How is this diagnosed? This condition may be diagnosed by examining the ear and testing fluid from the ear for bacteria and funguses. How is this treated? This condition may be treated with: Antibiotic ear drops. These are often given for 10-14 days. Medicines to reduce itching and swelling. Follow these instructions at home: If you were prescribed antibiotic ear drops, use them as told by your health care provider. Do not stop using the antibiotic even if you start to feel better. Take over-the-counter and prescription medicines only as told by your health care provider. Avoid getting water in your ears as told by your health care provider. This may include avoiding swimming or water sports for a few days. Keep all follow-up visits. This is important. How is this prevented? Keep your ears dry. Use the corner of a towel to dry your ears after you swim or bathe. Avoid scratching or putting things in your ear. Doing these things can damage the ear canal or remove the protective wax that lines it, which makes it easier for bacteria and funguses to grow. Avoid swimming in lakes, polluted water, or swimming  pools that may not have enough chlorine. Contact a health care provider if: You have a fever. Your ear is still red, swollen, painful, or draining pus after 3 days. Your redness, swelling, or pain gets worse. You have a severe headache. Get help right away if: You have redness, swelling, and pain or tenderness in the area behind your ear. Summary Otitis externa is an infection of the outer ear canal. Common causes include swimming in dirty water, moisture in the ear, or a cut or scrape in the ear. Symptoms include pain, redness, and swelling of the ear canal. If you were prescribed antibiotic ear drops, use them as told by your health care provider. Do not stop using the antibiotic even if you start to feel better. This information is not intended to replace advice given to you by your health care provider. Make sure you discuss any questions you have with your health care provider. Document Revised: 10/18/2020 Document Reviewed: 10/18/2020 Elsevier Patient Education  2023 Elsevier Inc.  

## 2022-01-23 NOTE — Progress Notes (Signed)
   Acute Office Visit  Subjective:     Patient ID: Kristi Willis, female    DOB: September 17, 1940, 81 y.o.   MRN: [id number]  Chief Complaint  Patient presents with   Ear Pain    HPI Here with daughter today who provided the history due to [facility] aphasia. Patient is in today for left ear pain x 2 days. She reports pain behind the ear and tenderness to the external ear. Denies fever or drainage from the ear. She has not tried any remedies.   She also has been having some dizziness today. She has a history of vertigo. She takes meclizine prn. Denies chest pain, increased shortness of breath, new focal weakness, or changes in mental status.   She recently had cellulitis of her LLE a few months ago. She is reporting tenderness, erythema, and mild swelling in this leg for the last few days.   ROS As per HPI.      Objective:    BP (!) 156/57   Pulse 64   Temp 97.9 F (36.6 C) (Temporal)   Ht $R'5\' 4"'qF$  (1.626 m)   Wt 163 lb (73.9 kg)   SpO2 94%   BMI 27.98 kg/m    Physical Exam Vitals and nursing note reviewed.  Constitutional:      General: She is not in acute distress.    Appearance: She is not ill-appearing, toxic-appearing or diaphoretic.  HENT:     Head: Normocephalic and atraumatic.     Left Ear: Tympanic membrane normal. Swelling (canal, pinna) and tenderness (pinna) present. No drainage. No mastoid tenderness.  Pulmonary:     Effort: Pulmonary effort is normal.  Skin:    General: Skin is warm and dry.     Findings: Erythema (with warmth, tenderness, and mild swelling to LLE) present.  Neurological:     Mental Status: She is alert and oriented to person, place, and time. Mental status is at baseline.    No results found for any visits on 01/23/22.      Assessment & Plan:   Ellora was seen today for ear pain.  Diagnoses and all orders for this visit:  Cellulitis of left lower leg Keflex as below.  -     cephALEXin (KEFLEX) 500 MG capsule; Take 1  capsule (500 mg total) by mouth 2 (two) times daily.  Acute otitis externa of left ear, unspecified type Keflex as below.  -     cephALEXin (KEFLEX) 500 MG capsule; Take 1 capsule (500 mg total) by mouth 2 (two) times daily.  Vertigo Can take meclizine prn. Will check labs as below.  -     BMP8+EGFR -     CBC with Differential/Platelet  Primary progressive aphasia ([place])  Return if symptoms worsen or fail to improve.  The patient indicates understanding of these issues and agrees with the plan.  Gwenlyn Perking, FNP

## 2022-01-24 LAB — CBC WITH DIFFERENTIAL/PLATELET
Basophils Absolute: 0 10*3/uL (ref 0.0–0.2)
Basos: 0 %
EOS (ABSOLUTE): 0.2 10*3/uL (ref 0.0–0.4)
Eos: 2 %
Hematocrit: 31.3 % — ABNORMAL LOW (ref 34.0–46.6)
Hemoglobin: 10 g/dL — ABNORMAL LOW (ref 11.1–15.9)
Immature Grans (Abs): 0 10*3/uL (ref 0.0–0.1)
Immature Granulocytes: 0 %
Lymphocytes Absolute: 2.1 10*3/uL (ref 0.7–3.1)
Lymphs: 23 %
MCH: 27.1 pg (ref 26.6–33.0)
MCHC: 31.9 g/dL (ref 31.5–35.7)
MCV: 85 fL (ref 79–97)
Monocytes Absolute: 0.7 10*3/uL (ref 0.1–0.9)
Monocytes: 8 %
Neutrophils Absolute: 5.8 10*3/uL (ref 1.4–7.0)
Neutrophils: 67 %
Platelets: 243 10*3/uL (ref 150–450)
RBC: 3.69 x10E6/uL — ABNORMAL LOW (ref 3.77–5.28)
RDW: 14.1 % (ref 11.7–15.4)
WBC: 8.8 10*3/uL (ref 3.4–10.8)

## 2022-01-24 LAB — BMP8+EGFR
BUN/Creatinine Ratio: 24 (ref 12–28)
BUN: 13 mg/dL (ref 8–27)
CO2: 22 mmol/L (ref 20–29)
Calcium: 9.7 mg/dL (ref 8.7–10.3)
Chloride: 100 mmol/L (ref 96–106)
Creatinine, Ser: 0.55 mg/dL — ABNORMAL LOW (ref 0.57–1.00)
Glucose: 182 mg/dL — ABNORMAL HIGH (ref 70–99)
Potassium: 4 mmol/L (ref 3.5–5.2)
Sodium: 139 mmol/L (ref 134–144)
eGFR: 93 mL/min/{1.73_m2} (ref >59)

## 2022-01-25 ENCOUNTER — Other Ambulatory Visit: Payer: Self-pay | Admitting: Family

## 2022-01-25 DIAGNOSIS — J3081 Allergic rhinitis due to animal (cat) (dog) hair and dander: Secondary | ICD-10-CM | POA: Diagnosis not present

## 2022-01-25 DIAGNOSIS — J3089 Other allergic rhinitis: Secondary | ICD-10-CM | POA: Diagnosis not present

## 2022-01-25 DIAGNOSIS — J301 Allergic rhinitis due to pollen: Secondary | ICD-10-CM | POA: Diagnosis not present

## 2022-01-30 DIAGNOSIS — J3089 Other allergic rhinitis: Secondary | ICD-10-CM | POA: Diagnosis not present

## 2022-01-30 DIAGNOSIS — J3081 Allergic rhinitis due to animal (cat) (dog) hair and dander: Secondary | ICD-10-CM | POA: Diagnosis not present

## 2022-01-30 DIAGNOSIS — J301 Allergic rhinitis due to pollen: Secondary | ICD-10-CM | POA: Diagnosis not present

## 2022-02-04 ENCOUNTER — Ambulatory Visit (HOSPITAL_COMMUNITY)
Admission: EM | Admit: 2022-02-04 | Discharge: 2022-02-04 | Disposition: A | Discharge status: Home / Self Care | Payer: Medicare PPO | Attending: Internal Medicine | Admitting: Internal Medicine

## 2022-02-04 ENCOUNTER — Encounter (HOSPITAL_COMMUNITY): Payer: Self-pay | Admitting: Emergency Medicine

## 2022-02-04 ENCOUNTER — Telehealth (INDEPENDENT_AMBULATORY_CARE_PROVIDER_SITE_OTHER): Payer: Medicare PPO | Admitting: Family Medicine

## 2022-02-04 DIAGNOSIS — R220 Localized swelling, mass and lump, head: Secondary | ICD-10-CM

## 2022-02-04 DIAGNOSIS — Q670 Congenital facial asymmetry: Secondary | ICD-10-CM

## 2022-02-04 DIAGNOSIS — M25472 Effusion, left ankle: Secondary | ICD-10-CM | POA: Diagnosis not present

## 2022-02-04 MED ORDER — ACETAMINOPHEN 325 MG PO TABS: 650.0000 mg | ORAL_TABLET | Freq: Four times a day (QID) | ORAL | 0 refills | Status: DC | PRN

## 2022-02-04 NOTE — ED Triage Notes (Signed)
Pt is present today with left side facial swelling. Pt daughters states that she noticed the swelling Friday

## 2022-02-04 NOTE — Discharge Instructions (Signed)
Take Tylenol as needed for pain every 6 hours.  Purchase compression stockings to reduce swelling in lower legs.  Elevate the lower legs to help reduce swelling as well.  If facial swelling becomes worse, please return to urgent care for further evaluation.  If you develop a severe headache, one-sided weakness, or develop difficulty breathing, please go to the emergency department for evaluation.  Otherwise, please follow-up with primary care provider.  Physical exam looks great today!  If you develop any new or worsening symptoms or do not improve in the next 2 to 3 days, please return.  If your symptoms are severe, please go to the emergency room.  Follow-up with your primary care provider for further evaluation and management of your symptoms as well as ongoing wellness visits.  I hope you feel better!

## 2022-02-04 NOTE — ED Provider Notes (Signed)
[place]    CSN: [id number] Arrival date & time: 02/04/22  1315      History   Chief Complaint Chief Complaint  Patient presents with   Facial Swelling    HPI Kristi Willis is a 81 y.o. female.   Patient presents to urgent care for evaluation of left-sided facial swelling that began to the inner canthus of her left eye on Friday, June 16 then progressed to the left side of her face and her left jaw.  Swelling worsened yesterday to the left upper lip.  There is no swelling to the right side of her face or left arm.  There is swelling present to patient's bilateral ankles and pain to the left ankle.  No recent long car rides, redness to the lower legs, or warmth.  Patient was treated for cellulitis to the left lower extremity ankle region 3 months ago successfully.  No recent falls, injuries, or trauma.  Patient recently had an eye surgical procedure on January 17, 2022 and has been taking Keflex for the last 2 to 3 days.  She has had Keflex in the past without allergic reaction or facial swelling.  No changes to laundry detergent, personal hygiene products, or contact with any other irritants recently.  Daughter states that patient is neurologically at baseline.  She has not tried taking any medications for symptoms. Denies shortness of breath, urinary symptoms, dizziness, confusion, headache, eye pain, ear pain, diarrhea, fever/chills, and chest pain.      Past Medical History:  Diagnosis Date   Allergy    Asthma    Cancer ([place])    skin   Diabetes mellitus    GERD (gastroesophageal reflux disease)    hiatal hernia   Hypercholesteremia    Hypertension    Neuromuscular disorder ([place])    DM neuropathy   Neuropathy    SVT (supraventricular tachycardia) ([place])     Patient Active Problem List   Diagnosis Date Noted   Pulmonary congestion 02/11/2021   Aortic atherosclerosis ([place]) 02/11/2021   Hypo-osmolar hyponatremia 02/10/2021   Primary progressive aphasia  ([place]) 03/23/2020   Pain in left shoulder 79/89/2119   Acute metabolic encephalopathy 41/74/0814   Iron deficiency anemia 08/05/2019   QT prolongation 08/05/2019   Hypothyroidism 02/12/2019   Speech articulation disorder 09/01/2018   Essential hypertension 10/03/2017   GAD (generalized anxiety disorder) 07/03/2017   Diabetes mellitus ([place]) 09/27/2016   Depression 03/29/2016   Insomnia 07/18/2015   Vitamin D deficiency 10/11/2013   Right carotid bruit 03/02/2013   Degenerative disc disease, lumbar 11/27/2012   Asthma 11/13/2010   Hyperlipidemia associated with type 2 diabetes mellitus ([place]) 11/13/2010   Cancer of skin 11/13/2010   Diabetic neuropathy ([place]) 11/13/2010   Wide-complex tachycardia==Atrial Tachycardia 11/13/2010    Past Surgical History:  Procedure Laterality Date   BREAST BIOPSY     left breast biopsy   LUMBAR LAMINECTOMY  08/10   SKIN CANCER EXCISION  2005   SPINE SURGERY     lumbar laminectomy    OB History   No obstetric history on file.      Home Medications    Prior to Admission medications   Medication Sig Start Date End Date Taking? Authorizing Provider  Accu-Chek Softclix Lancets lancets CHECK BLOOD SUGAR ONCE DAILY OR AS DIRECTED Dx E11.40 11/23/20   Evelina Dun A, FNP  acetaminophen (TYLENOL) 325 MG tablet Take 2 tablets (650 mg total) by mouth every 6 (six) hours as needed for mild pain,  fever or moderate pain. 02/04/22   Talbot Grumbling, FNP  acyclovir ointment (ZOVIRAX) 5 % APPLY TO THE AFFECTED AREA AROUND MOUTH EVERY 3 HOURS AS DIRECTED UNTIL CLEAR 05/31/19   Hawks, Alyse Low A, FNP  albuterol (PROVENTIL) (2.5 MG/3ML) 0.083% nebulizer solution Take 3 mLs (2.5 mg total) by nebulization every 6 (six) hours as needed for wheezing or shortness of breath. 10/05/21   Sharion Balloon, FNP  albuterol (VENTOLIN HFA) 108 (90 Base) MCG/ACT inhaler Inhale 2 puffs into the lungs every 6 (six) hours as needed for wheezing or shortness of breath. 10/05/21    Evelina Dun A, FNP  amLODipine (NORVASC) 5 MG tablet Take 1 tablet (5 mg total) by mouth daily. 10/05/21   Sharion Balloon, FNP  aspirin 81 MG chewable tablet Chew 81 mg by mouth at bedtime.    [provider]  azelastine (ASTELIN) 0.1 % nasal spray Place 2 sprays into both nostrils 2 (two) times daily. 08/24/14   Lysbeth Penner, FNP  Blood Glucose Monitoring Suppl (ACCU-CHEK AVIVA PLUS) w/Device KIT Check sugars daily & as needed Dx E11.9 08/11/19   Evelina Dun A, FNP  cephALEXin (KEFLEX) 500 MG capsule Take 1 capsule (500 mg total) by mouth 2 (two) times daily. 01/23/22   Gwenlyn Perking, FNP  cetirizine (ZYRTEC ALLERGY) 10 MG tablet Take 1 tablet (10 mg total) by mouth daily. 10/15/21   Evelina Dun A, FNP  Coenzyme Q10 (COQ10 PO) Take 1 tablet by mouth daily.    [provider]  doxazosin (CARDURA) 8 MG tablet TAKE (1) TABLET DAILY FOR HIGH BLOOD PRESSURE. 10/05/21   Hawks, Christy A, FNP  EPIPEN 2-PAK 0.3 MG/0.3ML SOAJ injection Inject 0.3 mg into the muscle as needed for anaphylaxis. Reported on 01/24/2016 03/07/14   [provider]  escitalopram (LEXAPRO) 20 MG tablet Take 1 tablet (20 mg total) by mouth daily. (NEEDS TO BE SEEN BEFORE NEXT REFILL) 10/05/21   Evelina Dun A, FNP  glucose blood (ACCU-CHEK AVIVA PLUS) test strip CHECK BLOOD SUGAR ONCE DAILY OR AS DIRECTED Dx E11.40 11/23/20   Evelina Dun A, FNP  ipratropium-albuterol (DUONEB) 0.5-2.5 (3) MG/3ML SOLN Take 3 mLs by nebulization 2 (two) times daily. 08/07/19   Cristal Deer, MD  ketoconazole (NIZORAL) 2 % cream Apply 1 application topically daily. 05/08/21   Sharion Balloon, FNP  levothyroxine (SYNTHROID) 75 MCG tablet Take 1 tablet (75 mcg total) by mouth daily before breakfast. 10/05/21   Evelina Dun A, FNP  lisinopril (ZESTRIL) 20 MG tablet Take 1 tablet (20 mg total) by mouth daily. 10/05/21   Sharion Balloon, FNP  meclizine (ANTIVERT) 25 MG tablet Take 1 tablet (25 mg total) by mouth 3  (three) times daily as needed for dizziness. 10/05/21   Sharion Balloon, FNP  metFORMIN (GLUCOPHAGE) 1000 MG tablet TAKE  (1)  TABLET TWICE A DAY WITH MEALS (BREAKFAST AND SUPPER) 10/05/21   Evelina Dun A, FNP  metoprolol succinate (TOPROL-XL) 100 MG 24 hr tablet TAKE ONE TABLET ONCE DAILY 01/25/22   Evelina Dun A, FNP  montelukast (SINGULAIR) 10 MG tablet Take 1 tablet (10 mg total) by mouth at bedtime. 04/24/21   Sharion Balloon, FNP  rosuvastatin (CRESTOR) 20 MG tablet Take 1 tablet (20 mg total) by mouth at bedtime. 10/05/21 10/05/22  Evelina Dun A, FNP  sitaGLIPtin (JANUVIA) 50 MG tablet Take 1 tablet (50 mg total) by mouth daily. 10/05/21   Sharion Balloon, FNP    Family History  Family History  Problem Relation Age of Onset   Cancer Mother 20       cervical cancer   Depression Mother    Heart disease Father    Stroke Father    Cancer Father        lung cancer   Heart disease Sister 50       MI   Hypertension Sister    Cancer Sister 59       breast cancer   Healthy Daughter     Social History Social History   Tobacco Use   Smoking status: Never   Smokeless tobacco: Never  Vaping Use   Vaping Use: Never used  Substance Use Topics   Alcohol use: No   Drug use: No     Allergies   Dimetapp c [phenylephrine-bromphen-codeine], Phenylephrine hcl, Shingrix [zoster vac recomb adjuvanted], Ultram [tramadol hcl], Celebrex [celecoxib], Cortisone, Fenofibrate, and Sulfa antibiotics   Review of Systems Review of Systems Per HPI  Physical Exam Triage Vital Signs ED Triage Vitals  Enc Vitals Group     BP 02/04/22 1429 129/72     Pulse Rate 02/04/22 1429 64     Resp 02/04/22 1429 18     Temp 02/04/22 1429 98.9 F (37.2 C)     Temp src --      SpO2 02/04/22 1429 95 %     Weight --      Height --      Head Circumference --      Peak Flow --      Pain Score 02/04/22 1428 0     Pain Loc --      Pain Edu? --      Excl. in [place]? --    No data found.  Updated Vital  Signs BP 129/72   Pulse 64   Temp 98.9 F (37.2 C)   Resp 18   SpO2 95%   Visual Acuity Right Eye Distance:   Left Eye Distance:   Bilateral Distance:    Right Eye Near:   Left Eye Near:    Bilateral Near:     Physical Exam Vitals and nursing note reviewed.  Constitutional:      Appearance: Normal appearance. She is not ill-appearing or toxic-appearing.     Comments: Very pleasant patient sitting on exam in position of comfort table in no acute distress.   HENT:     Head: Normocephalic and atraumatic.     Comments: Minimal swelling noted to patient's inferior left eye, left side of her upper lip, and left jaw. Airway is intact. No respiratory distress.     Right Ear: Hearing, tympanic membrane, ear canal and external ear normal. There is no impacted cerumen.     Left Ear: Hearing, tympanic membrane, ear canal and external ear normal. There is no impacted cerumen.     Nose: Nose normal. No congestion.     Mouth/Throat:     Lips: Pink.     Mouth: Mucous membranes are moist.     Pharynx: No posterior oropharyngeal erythema.  Eyes:     General: Lids are normal. Vision grossly intact. Gaze aligned appropriately. No visual field deficit.    Extraocular Movements: Extraocular movements intact.     Conjunctiva/sclera: Conjunctivae normal.  Cardiovascular:     Rate and Rhythm: Normal rate and regular rhythm.     Heart sounds: Normal heart sounds, S1 normal and S2 normal. No murmur heard.    No friction rub. No gallop.  Pulmonary:  Effort: Pulmonary effort is normal. No respiratory distress.     Breath sounds: Normal breath sounds and air entry.  Abdominal:     General: Bowel sounds are normal.     Palpations: Abdomen is soft.     Tenderness: There is no abdominal tenderness. There is no right CVA tenderness, left CVA tenderness or guarding.  Musculoskeletal:     Cervical back: Neck supple.     Right lower leg: No swelling. No edema.     Left lower leg: No swelling. No  edema.     Right ankle: Swelling present. No deformity. No tenderness. Normal range of motion.     Left ankle: Swelling present. No deformity. No tenderness. Normal range of motion.     Comments: +2 swelling to patient's left ankle mostly around the medial malleolus and +1 swelling to patient's right ankle.  No wound or drainage present to bilateral ankles.  Patient able to bear weight without difficulty or tenderness.  Full range of motion to bilateral lower ankles and feet.  No swelling, erythema, or warmth to bilateral calfs.  Decreased sensation to distal bilateral lower extremities at baseline due to diabetes diagnosis.  Skin:    General: Skin is warm and dry.     Capillary Refill: Capillary refill takes less than 2 seconds.     Findings: No rash.  Neurological:     General: No focal deficit present.     Mental Status: She is alert and oriented to person, place, and time. Mental status is at baseline.     Cranial Nerves: No cranial nerve deficit or facial asymmetry.     Sensory: Sensation is intact.     Motor: Motor function is intact. No weakness, tremor or pronator drift.     Coordination: Coordination is intact. Romberg sign negative. Coordination normal. Finger-Nose-Finger Test normal.     Gait: Gait is intact. Gait normal.     Comments: Patient is aphasic at baseline.  Daughter helping with communication via notepad.  Daughter states that patient is at baseline neurologically. Symmetric smile present.  Able to puff out cheeks symmetrically.  Able to raise both eyebrows equally.  Raises shoulders against resistance with 5/5 strength.  5/5 grip strength to upper extremities bilaterally.  5/5 strength against resistance with abduction and abduction against resistance to bilateral upper and lower extremities.   Psychiatric:        Mood and Affect: Mood normal.        Speech: Speech normal.        Behavior: Behavior normal.        Thought Content: Thought content normal.        Judgment:  Judgment normal.      UC Treatments / Results  Labs (all labs ordered are listed, but only abnormal results are displayed) Labs Reviewed - No data to display  EKG   Radiology No results found.  Procedures Procedures (including critical care time)  Medications Ordered in UC Medications - No data to display  Initial Impression / Assessment and Plan / UC Course  I have reviewed the triage vital signs and the nursing notes.  Pertinent labs & imaging results that were available during my care of the patient were reviewed by me and considered in my medical decision making (see chart for details).  Left-sided facial swelling Left-sided facial swelling is minimal and continues to improve without intervention.  Comprehensive neurologic examination is unrevealing of focal neurologic abnormality.  Patient is aphasic at baseline and communicates  well with her daughter using a pad and paper.  She is able to answer yes/no questions at baseline and otherwise uses a pad of paper to communicate.  Patient takes lisinopril, but doubt acute angioedema related to ACE inhibitor as facial swelling is unilateral.  Considered steroid use to treat swelling, but deferred due to age and chronic health problems.  No clinical indication for further emergent evaluation or advanced imaging at this time.  Swelling is very mild and will likely go away on its own.  Unknown cause or etiology at this time.  Discussed watchful waiting with patient and daughter.  They both verbalized understanding and agreement with plan.  Patient to return to urgent care or the emergency department if she develops any shortness of breath, wheeze, worsening facial swelling, sudden onset headache, or fever/chills.  Left ankle swelling Unknown etiology of left ankle swelling.  Patient provided Ace wrap in clinic for left ankle to reduce swelling and pain.  No clinical indication for x-ray at this time as she is able to bear full weight on  left ankle with mild tenderness.  No trauma or injury to left ankle.  No recent falls.  She is to purchase compression stockings over-the-counter to wear to bilateral lower extremities to reduce swelling.  She is also to elevate her bilateral lower extremities daily for same.  She may follow-up with her primary care provider for further evaluation and to assess improvement of bilateral lower leg swelling.  She may take Tylenol 1000 mg every 6 hours as needed for ankle pain.  Counseled patient regarding appropriate use of medications and potential side effects for all medications recommended or prescribed today. Discussed red flag signs and symptoms of worsening condition,when to call the PCP office, return to urgent care, and when to seek higher level of care. Patient verbalizes understanding and agreement with plan. All questions answered. Patient discharged in stable condition.  Final Clinical Impressions(s) / UC Diagnoses   Final diagnoses:  Left facial swelling  Left ankle swelling     Discharge Instructions      Take Tylenol as needed for pain every 6 hours.  Purchase compression stockings to reduce swelling in lower legs.  Elevate the lower legs to help reduce swelling as well.  If facial swelling becomes worse, please return to urgent care for further evaluation.  If you develop a severe headache, one-sided weakness, or develop difficulty breathing, please go to the emergency department for evaluation.  Otherwise, please follow-up with primary care provider.  Physical exam looks great today!  If you develop any new or worsening symptoms or do not improve in the next 2 to 3 days, please return.  If your symptoms are severe, please go to the emergency room.  Follow-up with your primary care provider for further evaluation and management of your symptoms as well as ongoing wellness visits.  I hope you feel better!     ED Prescriptions     Medication Sig Dispense Auth. Provider    acetaminophen (TYLENOL) 325 MG tablet Take 2 tablets (650 mg total) by mouth every 6 (six) hours as needed for mild pain, fever or moderate pain. 60 tablet Talbot Grumbling, FNP      PDMP not reviewed this encounter.   Talbot Grumbling, [place] 02/04/22 1821

## 2022-02-04 NOTE — Progress Notes (Signed)
MyChart Video visit  Subjective: [id number] swelling PCP: Sharion Balloon, FNP [id number] A Resler is a 81 y.o. female. Patient provides verbal consent for consult held via video.  Due to COVID-19 pandemic this visit was conducted virtually. This visit type was conducted due to national recommendations for restrictions regarding the COVID-19 Pandemic (e.g. social distancing, sheltering in place) in an effort to limit this patient's exposure and mitigate transmission in our community. All issues noted in this document were discussed and addressed.  A physical exam was not performed with this format.   Location of patient: car Location of provider: [facility] Others present for call: daughter  1. Facial swelling Had a procedure on right eye about 2 weeks ago (6/5) at [facility]. She had some swelling of the lid on the surgery side but now she is seeing some swelling on the left eyelid, cheek and jaw. The swelling didn't start until Friday. No difficulty swallowing, breathing.  Denies any visual disturbance in that left eye.  She has been on some type of eye antibiotic containing dexamethasone but that has been something that she has been on for a while now and it was not new.  No reports of fever.  She is aphasic at baseline.  ROS: Per HPI  Allergies  Allergen Reactions   Dimetapp C [Phenylephrine-Bromphen-Codeine] Itching   Phenylephrine Hcl Itching   Shingrix [Zoster Vac Recomb Adjuvanted] Other (See Comments)    Swelling and fever    Ultram [Tramadol Hcl] Other (See Comments)    Insomnia   Celebrex [Celecoxib] Rash   Cortisone Rash   Fenofibrate Rash   Sulfa Antibiotics Rash   Past Medical History:  Diagnosis Date   Allergy    Asthma    Cancer ([place])    skin   Diabetes mellitus    GERD (gastroesophageal reflux disease)    hiatal hernia   Hypercholesteremia    Hypertension    Neuromuscular disorder ([facility])    DM neuropathy   Neuropathy    SVT (supraventricular tachycardia) ([facility])      Current Outpatient Medications:    Accu-Chek Softclix Lancets lancets, CHECK BLOOD SUGAR ONCE DAILY OR AS DIRECTED Dx E11.40, Disp: 100 each, Rfl: 3   acetaminophen (TYLENOL) 325 MG tablet, Take 2 tablets (650 mg total) by mouth every 6 (six) hours as needed for mild pain or fever (or Fever >/= 101)., Disp: 60 tablet, Rfl: 0   acyclovir ointment (ZOVIRAX) 5 %, APPLY TO THE AFFECTED AREA AROUND MOUTH EVERY 3 HOURS AS DIRECTED UNTIL CLEAR, Disp: 15 g, Rfl: 0   albuterol (PROVENTIL) (2.5 MG/3ML) 0.083% nebulizer solution, Take 3 mLs (2.5 mg total) by nebulization every 6 (six) hours as needed for wheezing or shortness of breath., Disp: 150 mL, Rfl: 1   albuterol (VENTOLIN HFA) 108 (90 Base) MCG/ACT inhaler, Inhale 2 puffs into the lungs every 6 (six) hours as needed for wheezing or shortness of breath., Disp: 1 each, Rfl: 3   amLODipine (NORVASC) 5 MG tablet, Take 1 tablet (5 mg total) by mouth daily., Disp: 30 tablet, Rfl: 4   aspirin 81 MG chewable tablet, Chew 81 mg by mouth at bedtime., Disp: , Rfl:    azelastine (ASTELIN) 0.1 % nasal spray, Place 2 sprays into both nostrils 2 (two) times daily., Disp: 30 mL, Rfl: 11   Blood Glucose Monitoring Suppl (ACCU-CHEK AVIVA PLUS) w/Device KIT, Check sugars daily & as needed Dx E11.9, Disp: 1 kit, Rfl: 0   cephALEXin (KEFLEX) 500 MG capsule, Take 1  capsule (500 mg total) by mouth 2 (two) times daily., Disp: 14 capsule, Rfl: 0   cetirizine (ZYRTEC ALLERGY) 10 MG tablet, Take 1 tablet (10 mg total) by mouth daily., Disp: 90 tablet, Rfl: 1   Coenzyme Q10 (COQ10 PO), Take 1 tablet by mouth daily., Disp: , Rfl:    doxazosin (CARDURA) 8 MG tablet, TAKE (1) TABLET DAILY FOR HIGH BLOOD PRESSURE., Disp: 90 tablet, Rfl: 2   EPIPEN 2-PAK 0.3 MG/0.3ML SOAJ injection, Inject 0.3 mg into the muscle as needed for anaphylaxis. Reported on 01/24/2016, Disp: , Rfl:    escitalopram (LEXAPRO) 20 MG tablet, Take 1 tablet (20 mg total) by mouth daily. (NEEDS TO BE SEEN BEFORE  NEXT REFILL), Disp: 90 tablet, Rfl: 3   glucose blood (ACCU-CHEK AVIVA PLUS) test strip, CHECK BLOOD SUGAR ONCE DAILY OR AS DIRECTED Dx E11.40, Disp: 100 strip, Rfl: 3   ipratropium-albuterol (DUONEB) 0.5-2.5 (3) MG/3ML SOLN, Take 3 mLs by nebulization 2 (two) times daily., Disp: 125 mL, Rfl: 0   ketoconazole (NIZORAL) 2 % cream, Apply 1 application topically daily., Disp: 30 g, Rfl: 0   levothyroxine (SYNTHROID) 75 MCG tablet, Take 1 tablet (75 mcg total) by mouth daily before breakfast., Disp: 90 tablet, Rfl: 3   lisinopril (ZESTRIL) 20 MG tablet, Take 1 tablet (20 mg total) by mouth daily., Disp: 90 tablet, Rfl: 3   meclizine (ANTIVERT) 25 MG tablet, Take 1 tablet (25 mg total) by mouth 3 (three) times daily as needed for dizziness., Disp: 30 tablet, Rfl: 0   metFORMIN (GLUCOPHAGE) 1000 MG tablet, TAKE  (1)  TABLET TWICE A DAY WITH MEALS (BREAKFAST AND SUPPER), Disp: 180 tablet, Rfl: 3   metoprolol succinate (TOPROL-XL) 100 MG 24 hr tablet, TAKE ONE TABLET ONCE DAILY, Disp: 90 tablet, Rfl: 0   montelukast (SINGULAIR) 10 MG tablet, Take 1 tablet (10 mg total) by mouth at bedtime., Disp: 30 tablet, Rfl: 5   rosuvastatin (CRESTOR) 20 MG tablet, Take 1 tablet (20 mg total) by mouth at bedtime., Disp: 90 tablet, Rfl: 3   sitaGLIPtin (JANUVIA) 50 MG tablet, Take 1 tablet (50 mg total) by mouth daily., Disp: 90 tablet, Rfl: 2  Neuro: Some fullness and flattening of the left side of her face.  Bilateral upper eyelids appear to be slightly swollen.  She is able to follow me with her eyes and copy my movements.  Hypoglossal nerve appears to be intact as she is able to move her tongue in both directions.  Assessment/ Plan: 81 y.o. female   Facial asymmetry  Difficult to tell through a video visit if this facial asymmetry is reflective of a stroke or if this is soft tissue swelling.  I considered the soft tissue swelling being related to gravitational changes in the setting of recent right eye surgery but  I find it would be very unusual for this to have occurred this far out given that the surgery was almost 2 weeks ago.  I think that it would be an excellent idea to have a hands-on evaluation for her because I certainly do not want to miss a CVA.  Her daughter voiced great appreciation for the telephone visit and they are already on the waiting list at urgent care to be evaluated  Start time: 1:34pm End time: 1:45pm  Total time spent on patient care (including video visit/ documentation): 11 minutes  [place], [place] [phone]

## 2022-02-08 ENCOUNTER — Encounter: Payer: Self-pay | Admitting: Family Medicine

## 2022-02-08 ENCOUNTER — Ambulatory Visit: Payer: Medicare PPO | Admitting: Family Medicine

## 2022-02-08 VITALS — BP 130/61 | HR 71 | Temp 98.4°F | Resp 20 | Ht 64.0 in | Wt 164.0 lb

## 2022-02-08 DIAGNOSIS — J3081 Allergic rhinitis due to animal (cat) (dog) hair and dander: Secondary | ICD-10-CM | POA: Diagnosis not present

## 2022-02-08 DIAGNOSIS — J301 Allergic rhinitis due to pollen: Secondary | ICD-10-CM | POA: Diagnosis not present

## 2022-02-08 DIAGNOSIS — J3089 Other allergic rhinitis: Secondary | ICD-10-CM | POA: Diagnosis not present

## 2022-02-08 DIAGNOSIS — J069 Acute upper respiratory infection, unspecified: Secondary | ICD-10-CM

## 2022-02-08 MED ORDER — PREDNISONE 20 MG PO TABS: ORAL_TABLET | ORAL | 0 refills | Status: DC

## 2022-02-08 MED ORDER — AMOXICILLIN-POT CLAVULANATE 875-125 MG PO TABS: 1.0000 | ORAL_TABLET | Freq: Two times a day (BID) | ORAL | 0 refills | Status: AC

## 2022-02-08 MED ORDER — BENZONATATE 100 MG PO CAPS: 100.0000 mg | ORAL_CAPSULE | Freq: Three times a day (TID) | ORAL | 0 refills | Status: DC | PRN

## 2022-02-15 DIAGNOSIS — J3089 Other allergic rhinitis: Secondary | ICD-10-CM | POA: Diagnosis not present

## 2022-02-15 DIAGNOSIS — J3081 Allergic rhinitis due to animal (cat) (dog) hair and dander: Secondary | ICD-10-CM | POA: Diagnosis not present

## 2022-02-15 DIAGNOSIS — J301 Allergic rhinitis due to pollen: Secondary | ICD-10-CM | POA: Diagnosis not present

## 2022-02-28 DIAGNOSIS — E1142 Type 2 diabetes mellitus with diabetic polyneuropathy: Secondary | ICD-10-CM | POA: Diagnosis not present

## 2022-02-28 DIAGNOSIS — L84 Corns and callosities: Secondary | ICD-10-CM | POA: Diagnosis not present

## 2022-02-28 DIAGNOSIS — M79676 Pain in unspecified toe(s): Secondary | ICD-10-CM | POA: Diagnosis not present

## 2022-02-28 DIAGNOSIS — B351 Tinea unguium: Secondary | ICD-10-CM | POA: Diagnosis not present

## 2022-03-01 DIAGNOSIS — J3081 Allergic rhinitis due to animal (cat) (dog) hair and dander: Secondary | ICD-10-CM | POA: Diagnosis not present

## 2022-03-01 DIAGNOSIS — J301 Allergic rhinitis due to pollen: Secondary | ICD-10-CM | POA: Diagnosis not present

## 2022-03-01 DIAGNOSIS — J3089 Other allergic rhinitis: Secondary | ICD-10-CM | POA: Diagnosis not present

## 2022-03-05 DIAGNOSIS — J309 Allergic rhinitis, unspecified: Secondary | ICD-10-CM | POA: Insufficient documentation

## 2022-03-06 ENCOUNTER — Ambulatory Visit (INDEPENDENT_AMBULATORY_CARE_PROVIDER_SITE_OTHER): Payer: Medicare PPO

## 2022-03-06 VITALS — Wt 164.0 lb

## 2022-03-06 DIAGNOSIS — Z0000 Encounter for general adult medical examination without abnormal findings: Secondary | ICD-10-CM

## 2022-03-06 NOTE — Patient Instructions (Signed)
[name] , Thank you for taking time to come for your Medicare Wellness Visit. I appreciate your ongoing commitment to your health goals. Please review the following plan we discussed and let me know if I can assist you in the future.   Screening recommendations/referrals: Colonoscopy: Done 2014 - no repeat required Mammogram: Done 10/05/2021 - Repeat annually Bone Density: Done 04/27/2021 - Repeat every 2 years Recommended yearly ophthalmology/optometry visit for glaucoma screening and checkup Recommended yearly dental visit for hygiene and checkup  Vaccinations: Influenza vaccine: Done 06/11/2021 - Repeat annually  Pneumococcal vaccine: Done  05/31/2014 & 08/21/2021    Tdap vaccine: Done 02/02/2015 - Repeat in 10 years  Shingles vaccine: Done 03/06/2021 - declines second dose due to adverse reaction with first   Covid-19: Done  09/23/2019, 10/22/2019, 07/18/2020, 07/19/2020  Advanced directives: Advance directive discussed with you today. Even though you declined this today, please call our office should you change your mind, and we can give you the proper paperwork for you to fill out.   Conditions/risks identified: Aim for 4-6 glasses of water, plenty of protein in your diet and try to get up and walk/ stretch every hour for 5-10 minutes at a time.   Next appointment: Follow up in one year for your annual wellness visit    Preventive Care 65 Years and Older, Female Preventive care refers to lifestyle choices and visits with your health care provider that can promote health and wellness. What does preventive care include? A yearly physical exam. This is also called an annual well check. Dental exams once or twice a year. Routine eye exams. Ask your health care provider how often you should have your eyes checked. Personal lifestyle choices, including: Daily care of your teeth and gums. Regular physical activity. Eating a healthy diet. Avoiding tobacco and drug use. Limiting alcohol  use. Practicing safe sex. Taking low-dose aspirin every day. Taking vitamin and mineral supplements as recommended by your health care provider. What happens during an annual well check? The services and screenings done by your health care provider during your annual well check will depend on your age, overall health, lifestyle risk factors, and family history of disease. Counseling  Your health care provider may ask you questions about your: Alcohol use. Tobacco use. Drug use. Emotional well-being. Home and relationship well-being. Sexual activity. Eating habits. History of falls. Memory and ability to understand (cognition). Work and work Statistician. Reproductive health. Screening  You may have the following tests or measurements: Height, weight, and BMI. Blood pressure. Lipid and cholesterol levels. These may be checked every 5 years, or more frequently if you are over 63 years old. Skin check. Lung cancer screening. You may have this screening every year starting at age 70 if you have a 30-pack-year history of smoking and currently smoke or have quit within the past 15 years. Fecal occult blood test (FOBT) of the stool. You may have this test every year starting at age 56. Flexible sigmoidoscopy or colonoscopy. You may have a sigmoidoscopy every 5 years or a colonoscopy every 10 years starting at age 20. Hepatitis C blood test. Hepatitis B blood test. Sexually transmitted disease (STD) testing. Diabetes screening. This is done by checking your blood sugar (glucose) after you have not eaten for a while (fasting). You may have this done every 1-3 years. Bone density scan. This is done to screen for osteoporosis. You may have this done starting at age 8. Mammogram. This may be done every 1-2 years. Talk  to your health care provider about how often you should have regular mammograms. Talk with your health care provider about your test results, treatment options, and if necessary,  the need for more tests. Vaccines  Your health care provider may recommend certain vaccines, such as: Influenza vaccine. This is recommended every year. Tetanus, diphtheria, and acellular pertussis (Tdap, Td) vaccine. You may need a Td booster every 10 years. Zoster vaccine. You may need this after age 64. Pneumococcal 13-valent conjugate (PCV13) vaccine. One dose is recommended after age 53. Pneumococcal polysaccharide (PPSV23) vaccine. One dose is recommended after age 63. Talk to your health care provider about which screenings and vaccines you need and how often you need them. This information is not intended to replace advice given to you by your health care provider. Make sure you discuss any questions you have with your health care provider. Document Released: 09/01/2015 Document Revised: 04/24/2016 Document Reviewed: 06/06/2015 Elsevier Interactive Patient Education  2017 [employer] Prevention in the Home Falls can cause injuries. They can happen to people of all ages. There are many things you can do to make your home safe and to help prevent falls. What can I do on the outside of my home? Regularly fix the edges of walkways and driveways and fix any cracks. Remove anything that might make you trip as you walk through a door, such as a raised step or threshold. Trim any bushes or trees on the path to your home. Use bright outdoor lighting. Clear any walking paths of anything that might make someone trip, such as rocks or tools. Regularly check to see if handrails are loose or broken. Make sure that both sides of any steps have handrails. Any raised decks and porches should have guardrails on the edges. Have any leaves, snow, or ice cleared regularly. Use sand or salt on walking paths during winter. Clean up any spills in your garage right away. This includes oil or grease spills. What can I do in the bathroom? Use night lights. Install grab bars by the toilet and in the  tub and shower. Do not use towel bars as grab bars. Use non-skid mats or decals in the tub or shower. If you need to sit down in the shower, use a plastic, non-slip stool. Keep the floor dry. Clean up any water that spills on the floor as soon as it happens. Remove soap buildup in the tub or shower regularly. Attach bath mats securely with double-sided non-slip rug tape. Do not have throw rugs and other things on the floor that can make you trip. What can I do in the bedroom? Use night lights. Make sure that you have a light by your bed that is easy to reach. Do not use any sheets or blankets that are too big for your bed. They should not hang down onto the floor. Have a firm chair that has side arms. You can use this for support while you get dressed. Do not have throw rugs and other things on the floor that can make you trip. What can I do in the kitchen? Clean up any spills right away. Avoid walking on wet floors. Keep items that you use a lot in easy-to-reach places. If you need to reach something above you, use a strong step stool that has a grab bar. Keep electrical cords out of the way. Do not use floor polish or wax that makes floors slippery. If you must use wax, use non-skid floor wax. Do  not have throw rugs and other things on the floor that can make you trip. What can I do with my stairs? Do not leave any items on the stairs. Make sure that there are handrails on both sides of the stairs and use them. Fix handrails that are broken or loose. Make sure that handrails are as long as the stairways. Check any carpeting to make sure that it is firmly attached to the stairs. Fix any carpet that is loose or worn. Avoid having throw rugs at the top or bottom of the stairs. If you do have throw rugs, attach them to the floor with carpet tape. Make sure that you have a light switch at the top of the stairs and the bottom of the stairs. If you do not have them, ask someone to add them for  you. What else can I do to help prevent falls? Wear shoes that: Do not have high heels. Have rubber bottoms. Are comfortable and fit you well. Are closed at the toe. Do not wear sandals. If you use a stepladder: Make sure that it is fully opened. Do not climb a closed stepladder. Make sure that both sides of the stepladder are locked into place. Ask someone to hold it for you, if possible. Clearly mark and make sure that you can see: Any grab bars or handrails. First and last steps. Where the edge of each step is. Use tools that help you move around (mobility aids) if they are needed. These include: Canes. Walkers. Scooters. Crutches. Turn on the lights when you go into a dark area. Replace any light bulbs as soon as they burn out. Set up your furniture so you have a clear path. Avoid moving your furniture around. If any of your floors are uneven, fix them. If there are any pets around you, be aware of where they are. Review your medicines with your doctor. Some medicines can make you feel dizzy. This can increase your chance of falling. Ask your doctor what other things that you can do to help prevent falls. This information is not intended to replace advice given to you by your health care provider. Make sure you discuss any questions you have with your health care provider. Document Released: 06/01/2009 Document Revised: 01/11/2016 Document Reviewed: 09/09/2014 Elsevier Interactive Patient Education  2017 [employer].

## 2022-03-06 NOTE — Progress Notes (Signed)
Subjective:   Kristi Willis is a 81 y.o. female who presents for Medicare Annual (Subsequent) preventive examination.  Virtual Visit via Telephone Note  I connected with  [name] on 03/06/22 at 12:30 PM EDT by telephone and verified that I am speaking with the correct person using two identifiers.  Location: Patient: Home Provider: [facility] Persons participating in the virtual visit: patient, her daughter, Kieth Brightly and Nurse Health Advisor   I discussed the limitations, risks, security and privacy concerns of performing an evaluation and management service by telephone and the availability of in person appointments. The patient expressed understanding and agreed to proceed.  Interactive audio and video telecommunications were attempted between this nurse and patient, however failed, due to patient having technical difficulties OR patient did not have access to video capability.  We continued and completed visit with audio only.  Some vital signs may be absent or patient reported.   Huel Centola E Calea Hribar, LPN   Review of Systems     Cardiac Risk Factors include: advanced age (>83mn, >>29women);diabetes mellitus;dyslipidemia;hypertension;sedentary lifestyle;Other (see comment), Risk factor comments: atherosclerosis     Objective:    Today's Vitals   03/06/22 1231  Weight: 164 lb (74.4 kg)   Body mass index is 28.15 kg/m.     03/06/2022   12:39 PM 11/07/2021   10:05 AM 10/23/2021    1:18 PM 09/26/2021    2:32 PM 03/05/2021    2:59 PM 02/10/2021    9:46 PM 02/10/2021   12:17 PM  Advanced Directives  Does Patient Have a Medical Advance Directive? No No No No No  No  Would patient like information on creating a medical advance directive? No - Patient declined   No - Patient declined No - Guardian declined No - Guardian declined;No - Patient declined     Current Medications (verified) Outpatient Encounter Medications as of 03/06/2022  Medication Sig   acetaminophen (TYLENOL)  325 MG tablet Take 2 tablets (650 mg total) by mouth every 6 (six) hours as needed for mild pain, fever or moderate pain.   acyclovir ointment (ZOVIRAX) 5 % APPLY TO THE AFFECTED AREA AROUND MOUTH EVERY 3 HOURS AS DIRECTED UNTIL CLEAR   albuterol (PROVENTIL) (2.5 MG/3ML) 0.083% nebulizer solution Take 3 mLs (2.5 mg total) by nebulization every 6 (six) hours as needed for wheezing or shortness of breath.   albuterol (VENTOLIN HFA) 108 (90 Base) MCG/ACT inhaler Inhale 2 puffs into the lungs every 6 (six) hours as needed for wheezing or shortness of breath.   amLODipine (NORVASC) 5 MG tablet Take 1 tablet (5 mg total) by mouth daily.   aspirin 81 MG chewable tablet Chew 81 mg by mouth at bedtime.   azelastine (ASTELIN) 0.1 % nasal spray Place 2 sprays into both nostrils 2 (two) times daily.   benzonatate (TESSALON PERLES) 100 MG capsule Take 1 capsule (100 mg total) by mouth 3 (three) times daily as needed for cough.   cetirizine (ZYRTEC ALLERGY) 10 MG tablet Take 1 tablet (10 mg total) by mouth daily.   Coenzyme Q10 (COQ10 PO) Take 1 tablet by mouth daily.   doxazosin (CARDURA) 8 MG tablet TAKE (1) TABLET DAILY FOR HIGH BLOOD PRESSURE.   escitalopram (LEXAPRO) 20 MG tablet Take 1 tablet (20 mg total) by mouth daily. (NEEDS TO BE SEEN BEFORE NEXT REFILL)   ipratropium-albuterol (DUONEB) 0.5-2.5 (3) MG/3ML SOLN Take 3 mLs by nebulization 2 (two) times daily.   ketoconazole (NIZORAL) 2 % cream Apply 1  application topically daily.   levothyroxine (SYNTHROID) 75 MCG tablet Take 1 tablet (75 mcg total) by mouth daily before breakfast.   lisinopril (ZESTRIL) 20 MG tablet Take 1 tablet (20 mg total) by mouth daily.   lisinopril (ZESTRIL) 40 MG tablet Take 1 tablet by mouth daily.   loratadine (CLARITIN) 10 MG tablet Take 1 tablet by mouth daily.   meclizine (ANTIVERT) 25 MG tablet Take 1 tablet (25 mg total) by mouth 3 (three) times daily as needed for dizziness.   metFORMIN (GLUCOPHAGE) 1000 MG tablet TAKE   (1)  TABLET TWICE A DAY WITH MEALS (BREAKFAST AND SUPPER)   metoprolol succinate (TOPROL-XL) 100 MG 24 hr tablet TAKE ONE TABLET ONCE DAILY   montelukast (SINGULAIR) 10 MG tablet Take 1 tablet (10 mg total) by mouth at bedtime.   neomycin-polymyxin b-dexamethasone (MAXITROL) 3.5-10000-0.1 OINT    predniSONE (DELTASONE) 20 MG tablet 2 po at sametime daily for 5 days- start tomorrow   rosuvastatin (CRESTOR) 20 MG tablet Take 1 tablet (20 mg total) by mouth at bedtime.   sitaGLIPtin (JANUVIA) 50 MG tablet Take 1 tablet (50 mg total) by mouth daily.   Accu-Chek Softclix Lancets lancets CHECK BLOOD SUGAR ONCE DAILY OR AS DIRECTED Dx E11.40 (Patient not taking: Reported on 03/06/2022)   Blood Glucose Monitoring Suppl (ACCU-CHEK AVIVA PLUS) w/Device KIT Check sugars daily & as needed Dx E11.9 (Patient not taking: Reported on 03/06/2022)   EPIPEN 2-PAK 0.3 MG/0.3ML SOAJ injection Inject 0.3 mg into the muscle as needed for anaphylaxis. Reported on 01/24/2016   glucose blood (ACCU-CHEK AVIVA PLUS) test strip CHECK BLOOD SUGAR ONCE DAILY OR AS DIRECTED Dx E11.40 (Patient not taking: Reported on 03/06/2022)   No facility-administered encounter medications on file as of 03/06/2022.    Allergies (verified) Dimetapp c [phenylephrine-bromphen-codeine], Phenylephrine hcl, Shingrix [zoster vac recomb adjuvanted], Ultram [tramadol hcl], Celebrex [celecoxib], Cortisone, Fenofibrate, and Sulfa antibiotics   History: Past Medical History:  Diagnosis Date   Allergy    Asthma    Cancer ([place])    skin   Diabetes mellitus    GERD (gastroesophageal reflux disease)    hiatal hernia   Hypercholesteremia    Hypertension    Neuromuscular disorder ([place])    DM neuropathy   Neuropathy    SVT (supraventricular tachycardia) ([facility])    Past Surgical History:  Procedure Laterality Date   BREAST BIOPSY     left breast biopsy   LUMBAR LAMINECTOMY  08/10   SKIN CANCER EXCISION  2005   SPINE SURGERY     lumbar laminectomy    Family History  Problem Relation Age of Onset   Cancer Mother 5       cervical cancer   Depression Mother    Heart disease Father    Stroke Father    Cancer Father        lung cancer   Heart disease Sister 57       MI   Hypertension Sister    Cancer Sister 29       breast cancer   Healthy Daughter    Social History   Socioeconomic History   Marital status: Widowed    Spouse name: Not on file   Number of children: 1   Years of education: Not on file   Highest education level: 12th grade  Occupational History   Occupation: Retired from [employer] retired  Tobacco Use   Smoking status: Never   Smokeless tobacco: Never  Vaping  Use   Vaping Use: Never used  Substance and Sexual Activity   Alcohol use: No   Drug use: No   Sexual activity: Not Currently  Other Topics Concern   Not on file  Social History Narrative   Lives home alone - daughter lives about 5 miles away.   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   Social Determinants of Health   Financial Resource Strain: Low Risk  (03/06/2022)   Overall Financial Resource Strain (CARDIA)    Difficulty of Paying Living Expenses: Not hard at all  Food Insecurity: No Food Insecurity (03/06/2022)   Hunger Vital Sign    Worried About Running Out of Food in the Last Year: Never true    Ran Out of Food in the Last Year: Never true  Transportation Needs: No Transportation Needs (03/06/2022)   PRAPARE - Hydrologist (Medical): No    Lack of Transportation (Non-Medical): No  Physical Activity: Insufficiently Active (03/06/2022)   Exercise Vital Sign    Days of Exercise per Week: 7 days    Minutes of Exercise per Session: 10 min  Stress: No  Stress Concern Present (03/06/2022)   [place]    Feeling of Stress : Not at all  Social Connections: Moderately Integrated (03/06/2022)   Social Connection and Isolation Panel [NHANES]    Frequency of Communication with Friends and Family: More than three times a week    Frequency of Social Gatherings with Friends and Family: More than three times a week    Attends Religious Services: More than 4 times per year    Active Member of [employer] or Organizations: Yes    Attends Archivist Meetings: More than 4 times per year    Marital Status: Widowed    Tobacco Counseling Counseling given: Not Answered   Clinical Intake:  Pre-visit preparation completed: Yes  Pain : No/denies pain     BMI - recorded: 28.15 Nutritional Status: BMI 25 -29 Overweight Diabetes: Yes CBG done?: No Did pt. bring in CBG monitor from home?: No  How often do you need to have someone help you when you read instructions, pamphlets, or other written materials from your doctor or pharmacy?: 1 - Never  Diabetic? Nutrition Risk Assessment:  Has the patient had any N/V/D within the last 2 months?  No  Does the patient have any non-healing wounds?  No  Has the patient had any unintentional weight loss or weight gain?  No   Diabetes:  Is the patient diabetic?  Yes  If diabetic, was a CBG obtained today?  No  Did the patient bring in their glucometer from home?  No  How often do you monitor your CBG's? never.   Financial Strains and Diabetes Management:  Are you having any financial strains with the device, your supplies or your medication? No .  Does the patient want to be seen by Chronic Care Management for management of their diabetes?  No  Would the patient like to be referred to a Nutritionist or for Diabetic Management?  No   Diabetic Exams:  Diabetic Eye Exam: Completed 05/16/2021.   Diabetic Foot Exam: Completed 04/24/2021.  Pt has been advised  about the importance in completing this exam. Pt is scheduled for diabetic foot exam on next pcp appt.    Interpreter Needed?: No  Information entered by :: Jordan Pardini, LPN   Activities of Daily Living    03/06/2022   12:39 PM  In your present state of health, do you have any difficulty performing the following activities:  Hearing? 1  Vision? 0  Difficulty concentrating or making decisions? 0  Walking or climbing stairs? 0  Dressing or bathing? 0  Doing errands, shopping? 0  Preparing Food and eating ? N  Using the Toilet? N  In the past six months, have you accidently leaked urine? N  Do you have problems with loss of bowel control? N  Managing your Medications? N  Managing your Finances? N  Housekeeping or managing your Housekeeping? N    Patient Care Team: Sharion Balloon, FNP as PCP - General (Family Medicine) Monna Fam, MD as Consulting Physician (Ophthalmology) Cameron Sprang, MD as Consulting Physician (Neurology) Elease Hashimoto (Neurology) Marilynne Halsted, MD as Referring Physician (Ophthalmology) Mosetta Anis, MD as Referring Physician (Allergy) Donato Heinz, MD as Consulting Physician (Cardiology) Steffanie Rainwater, DPM as Consulting Physician (Podiatry)  Indicate any recent Medical Services you may have received from other than [facility] providers in the past year (date may be approximate).     Assessment:   This is a routine wellness examination for [place].  Hearing/Vision screen Hearing Screening - Comments:: Wears hearing aids - annual to semi-annual f/u with [facility] Vision Screening - Comments:: Wears rx glasses - up to date with routine eye exams with [facility]; recently seeing [facility] also  Dietary issues and exercise activities discussed: Current Exercise Habits: Home exercise routine, Type of exercise: walking;Other - see comments (gardening, house work), Time (Minutes): 15, Frequency (Times/Week): 7,  Weekly Exercise (Minutes/Week): 105, Intensity: Mild, Exercise limited by: neurologic condition(s);orthopedic condition(s)   Goals Addressed             This Visit's Progress    Plan meals   On track    Try to eat vegetables, lean proteins, and limit amount of fried foods      Prevent falls   On track      Depression Screen    03/06/2022   12:38 PM 01/23/2022   11:20 AM 11/16/2021   10:28 AM 10/05/2021    2:52 PM 09/13/2021    2:03 PM 03/05/2021    2:47 PM 03/23/2020    2:13 PM  PHQ 2/9 Scores  PHQ - 2 Score 0  0 0 0 0 0  PHQ- 9 Score     2  2  Exception Documentation  Patient refusal         Fall Risk    03/06/2022   12:35 PM 11/16/2021   10:28 AM 10/23/2021    1:18 PM 10/05/2021    2:51 PM 09/13/2021    2:03 PM  Fall Risk   Falls in the past year? 0 0 0 0 0  Number falls in past yr: 0      Injury with Fall? 0  0    Risk for fall due to : Orthopedic patient;Impaired balance/gait      Follow up Falls prevention discussed        FALL RISK PREVENTION PERTAINING TO THE HOME:  Any stairs in or around the home? Yes  If so, are there any without handrails? No  Home free of loose throw rugs in walkways, pet  beds, electrical cords, etc? Yes  Adequate lighting in your home to reduce risk of falls? Yes   ASSISTIVE DEVICES UTILIZED TO PREVENT FALLS:  Life alert? No  Use of a cane, walker or w/c?  Cane prn Grab bars in the bathroom? No  Shower chair or bench in shower? No  Elevated toilet seat or a handicapped toilet? No   TIMED UP AND GO:  Was the test performed? No . Telephonic visit  Cognitive Function:    03/06/2022   12:42 PM 09/17/2017    4:00 PM 06/05/2016   11:22 AM 12/22/2014   10:41 AM  MMSE - Mini Mental State Exam  Not completed: Unable to complete;Refused     Orientation to time  '5 5 5  ' Orientation to Place  '5 5 5  ' Registration  '3 3 3  ' Attention/ Calculation  '5 5 5  ' Recall  '3 3 3  ' Language- name 2 objects  '2 2 2  ' Language- repeat  0 0 0  Language-  follow 3 step command  '3 3 3  ' Language- read & follow direction  '1 1 1  ' Write a sentence  '1 1 1  ' Copy design  '1 1 1  ' Total score  '29 29 29      ' 06/10/2016    1:00 PM  Montreal Cognitive Assessment   Visuospatial/ Executive (0/5) 5  Naming (0/3) 3  Attention: Read list of digits (0/2) 1  Attention: Read list of letters (0/1) 0  Attention: Serial 7 subtraction starting at 100 (0/3) 1  Language: Repeat phrase (0/2) 1  Language : Fluency (0/1) 0  Abstraction (0/2) 2  Delayed Recall (0/5) 5  Orientation (0/6) 5  Total 23  Adjusted Score (based on education) 24      02/24/2020   12:14 PM 02/17/2019    1:42 PM  6CIT Screen  What Year? 0 points 0 points  What month? 0 points 0 points  What time? 0 points 0 points  Count back from 20 0 points 0 points  Months in reverse 2 points 0 points  Repeat phrase 4 points 0 points  Total Score 6 points 0 points    Immunizations Immunization History  Administered Date(s) Administered   Fluad Quad(high Dose 65+) 06/29/2019, 06/11/2021   Influenza, High Dose Seasonal PF 08/29/2014, 06/13/2016, 07/03/2017, 08/06/2017, 06/16/2018, 07/15/2018, 06/15/2019, 07/19/2020, 08/21/2021   Influenza,inj,Quad PF,6+ Mos 05/20/2013, 05/31/2014, 07/18/2015   Influenza-Unspecified 08/20/2011   Moderna Sars-Covid-2 Vaccination 09/23/2019, 10/22/2019, 07/18/2020   PFIZER(Purple Top)SARS-COV-2 Vaccination 07/19/2020   Pneumococcal Conjugate-13 05/31/2014   Pneumococcal Polysaccharide-23 07/22/2012, 06/14/2015, 03/04/2016, 11/22/2016, 08/06/2017, 07/15/2018, 06/15/2019, 08/21/2021   Tdap 02/02/2015   Zoster Recombinat (Shingrix) 03/06/2021    TDAP status: Up to date  Flu Vaccine status: Up to date  Pneumococcal vaccine status: Up to date  Covid-19 vaccine status: Completed vaccines  Qualifies for Shingles Vaccine? Yes   Zostavax completed Yes   Shingrix Completed?: No.    Education has been provided regarding the importance of this vaccine. Patient has  been advised to call insurance company to determine out of pocket expense if they have not yet received this vaccine. Advised may also receive vaccine at local pharmacy or Health Dept. Verbalized acceptance and understanding.  Screening Tests Health Maintenance  Topic Date Due   COVID-19 Vaccine (5 - Booster for Moderna series) 09/13/2020   Zoster Vaccines- Shingrix (2 of 2) 05/01/2021   INFLUENZA VACCINE  03/19/2022   HEMOGLOBIN A1C  04/04/2022   FOOT EXAM  04/24/2022   OPHTHALMOLOGY EXAM  05/16/2022   DEXA SCAN  04/27/2024   TETANUS/TDAP  02/01/2025   Pneumonia Vaccine 62+ Years old  Completed   HPV VACCINES  Aged Out    Health Maintenance  Health Maintenance Due  Topic Date Due   COVID-19 Vaccine (5 - Booster for Moderna series) 09/13/2020   Zoster Vaccines- Shingrix (2 of 2) 05/01/2021    Colorectal cancer screening: No longer required.   Mammogram status: Completed 10/05/2021. Repeat every year  Bone Density status: Completed 04/27/2021. Results reflect: Bone density results: NORMAL. Repeat every 3 years.  Lung Cancer Screening: (Low Dose CT Chest recommended if Age 36-80 years, 30 pack-year currently smoking OR have quit w/in 15years.) does not qualify  Additional Screening:  Hepatitis C Screening: does not qualify  Vision Screening: Recommended annual ophthalmology exams for early detection of glaucoma and other disorders of the eye. Is the patient up to date with their annual eye exam?  Yes  Who is the provider or what is the name of the office in which the patient attends annual eye exams? [facility] If pt is not established with a provider, would they like to be referred to a provider to establish care? No .   Dental Screening: Recommended annual dental exams for proper oral hygiene  Community Resource Referral / Chronic Care Management: CRR required this visit?  No   CCM required this visit?  No      Plan:     I have personally reviewed and  noted the following in the patient's chart:   Medical and social history Use of alcohol, tobacco or illicit drugs  Current medications and supplements including opioid prescriptions.  Functional ability and status Nutritional status Physical activity Advanced directives List of other physicians Hospitalizations, surgeries, and ER visits in previous 12 months Vitals Screenings to include cognitive, depression, and falls Referrals and appointments  In addition, I have reviewed and discussed with patient certain preventive protocols, quality metrics, and best practice recommendations. A written personalized care plan for preventive services as well as general preventive health recommendations were provided to patient.     Sandrea Hammond, LPN   4/65/2076   Nurse Notes: None

## 2022-03-18 DIAGNOSIS — J301 Allergic rhinitis due to pollen: Secondary | ICD-10-CM | POA: Diagnosis not present

## 2022-03-18 DIAGNOSIS — J3089 Other allergic rhinitis: Secondary | ICD-10-CM | POA: Diagnosis not present

## 2022-03-18 DIAGNOSIS — J3081 Allergic rhinitis due to animal (cat) (dog) hair and dander: Secondary | ICD-10-CM | POA: Diagnosis not present

## 2022-03-20 DIAGNOSIS — J301 Allergic rhinitis due to pollen: Secondary | ICD-10-CM | POA: Diagnosis not present

## 2022-03-20 DIAGNOSIS — J3081 Allergic rhinitis due to animal (cat) (dog) hair and dander: Secondary | ICD-10-CM | POA: Diagnosis not present

## 2022-03-20 DIAGNOSIS — J3089 Other allergic rhinitis: Secondary | ICD-10-CM | POA: Diagnosis not present

## 2022-03-26 DIAGNOSIS — J3089 Other allergic rhinitis: Secondary | ICD-10-CM | POA: Diagnosis not present

## 2022-03-26 DIAGNOSIS — J301 Allergic rhinitis due to pollen: Secondary | ICD-10-CM | POA: Diagnosis not present

## 2022-03-26 DIAGNOSIS — J3081 Allergic rhinitis due to animal (cat) (dog) hair and dander: Secondary | ICD-10-CM | POA: Diagnosis not present

## 2022-04-01 ENCOUNTER — Ambulatory Visit: Payer: Medicare PPO | Admitting: Family

## 2022-04-01 ENCOUNTER — Encounter: Payer: Self-pay | Admitting: Family

## 2022-04-01 VITALS — BP 126/50 | HR 62 | Temp 97.3°F | Ht 64.0 in | Wt 162.2 lb

## 2022-04-01 DIAGNOSIS — H5789 Other specified disorders of eye and adnexa: Secondary | ICD-10-CM

## 2022-04-01 DIAGNOSIS — I10 Essential (primary) hypertension: Secondary | ICD-10-CM | POA: Diagnosis not present

## 2022-04-01 DIAGNOSIS — T7840XA Allergy, unspecified, initial encounter: Secondary | ICD-10-CM | POA: Diagnosis not present

## 2022-04-01 MED ORDER — AMLODIPINE BESYLATE 5 MG PO TABS: 5.0000 mg | ORAL_TABLET | Freq: Every day | ORAL | 1 refills | Status: DC

## 2022-04-01 NOTE — Patient Instructions (Signed)
Peripheral Edema  Peripheral edema is swelling that is caused by a buildup of fluid. Peripheral edema most often affects the lower legs, ankles, and feet. It can also develop in the arms, hands, and face. The area of the body that has peripheral edema will look swollen. It may also feel heavy or warm. Your clothes may start to feel tight. Pressing on the area may make a temporary dent in your skin (pitting edema). You may not be able to move your swollen arm or leg as much as usual. There are many causes of peripheral edema. It can happen because of a complication of other conditions such as heart failure, kidney disease, or a problem with your circulation. It also can be a side effect of certain medicines or happen because of an infection. It often happens to women during pregnancy. Sometimes, the cause is not known. Follow these instructions at home: Managing pain, stiffness, and swelling  Raise (elevate) your legs while you are sitting or lying down. Move around often to prevent stiffness and to reduce swelling. Do not sit or stand for long periods of time. Do not wear tight clothing. Do not wear garters on your upper legs. Exercise your legs to get your circulation going. This helps to move the fluid back into your blood vessels, and it may help the swelling go down. Wear compression stockings as told by your health care provider. These stockings help to prevent blood clots and reduce swelling in your legs. It is important that these are the correct size. These stockings should be prescribed by your doctor to prevent possible injuries. If elastic bandages or wraps are recommended, use them as told by your health care provider. Medicines Take over-the-counter and prescription medicines only as told by your health care provider. Your health care provider may prescribe medicine to help your body get rid of excess water (diuretic). Take this medicine if you are told to take it. General  instructions Eat a low-salt (low-sodium) diet as told by your health care provider. Sometimes, eating less salt may reduce swelling. Pay attention to any changes in your symptoms. Moisturize your skin daily to help prevent skin from cracking and draining. Keep all follow-up visits. This is important. Contact a health care provider if: You have a fever. You have swelling in only one leg. You have increased swelling, redness, or pain in one or both of your legs. You have drainage or sores at the area where you have edema. Get help right away if: You have edema that starts suddenly or is getting worse, especially if you are pregnant or have a medical condition. You develop shortness of breath, especially when you are lying down. You have pain in your chest or abdomen. You feel weak. You feel like you will faint. These symptoms may be an emergency. Get help right away. Call 911. Do not wait to see if the symptoms will go away. Do not drive yourself to the hospital. Summary Peripheral edema is swelling that is caused by a buildup of fluid. Peripheral edema most often affects the lower legs, ankles, and feet. Move around often to prevent stiffness and to reduce swelling. Do not sit or stand for long periods of time. Pay attention to any changes in your symptoms. Contact a health care provider if you have edema that starts suddenly or is getting worse, especially if you are pregnant or have a medical condition. Get help right away if you develop shortness of breath, especially when lying down.   This information is not intended to replace advice given to you by your health care provider. Make sure you discuss any questions you have with your health care provider. Document Revised: 04/09/2021 Document Reviewed: 04/09/2021 Elsevier Patient Education  2023 [employer]  

## 2022-04-01 NOTE — Progress Notes (Signed)
   Subjective:    Patient ID: Kristi Willis, female    DOB: 03-28-1941, 81 y.o.   MRN: [id number]  Chief Complaint  Patient presents with   eye swollen   Pt presents to the office today with left eye swollen that she noticed yesterday. Denies any injury, pain, or change in vision.   Complaining of left foot swelling.  Hypertension This is a chronic problem. The current episode started more than 1 year ago. The problem has been resolved since onset. The problem is controlled. Associated symptoms include malaise/fatigue and peripheral edema (slight). Pertinent negatives include no shortness of breath. The current treatment provides moderate improvement.      Review of Systems  Constitutional:  Positive for malaise/fatigue.  Respiratory:  Negative for shortness of breath.   All other systems reviewed and are negative.      Objective:   Physical Exam Vitals reviewed.  Constitutional:      General: She is not in acute distress.    Appearance: She is well-developed.  HENT:     Head: Normocephalic and atraumatic.  Eyes:     Pupils: Pupils are equal, round, and reactive to light.  Neck:     Thyroid: No thyromegaly.  Cardiovascular:     Rate and Rhythm: Normal rate and regular rhythm.     Heart sounds: Normal heart sounds. No murmur heard. Pulmonary:     Effort: Pulmonary effort is normal. No respiratory distress.     Breath sounds: Normal breath sounds. No wheezing.  Abdominal:     General: Bowel sounds are normal. There is no distension.     Palpations: Abdomen is soft.     Tenderness: There is no abdominal tenderness.  Musculoskeletal:        General: No tenderness. Normal range of motion.     Cervical back: Normal range of motion and neck supple.  Skin:    General: Skin is warm and dry.  Neurological:     Mental Status: She is alert and oriented to person, place, and time.     Cranial Nerves: No cranial nerve deficit.     Deep Tendon Reflexes: Reflexes are normal  and symmetric.  Psychiatric:        Speech: She is noncommunicative.        Behavior: Behavior normal.        Thought Content: Thought content normal.        Judgment: Judgment normal.       BP (!) 126/50   Pulse 62   Temp (!) 97.3 F (36.3 C) (Temporal)   Ht '5\' 4"'$  (1.626 m)   Wt 162 lb 3.2 oz (73.6 kg)   BMI 27.84 kg/m      Assessment & Plan:  Kristi Willis comes in today with chief complaint of eye swollen   Diagnosis and orders addressed:  1. Essential hypertension - amLODipine (NORVASC) 5 MG tablet; Take 1 tablet (5 mg total) by mouth daily.  Dispense: 90 tablet; Refill: 1  2. Eye swelling, left  3. Allergic reaction, initial encounter  Recommend taking benadryl and zyrtec  Cool compress  If swelling continues or worsen may need steroids  Follow up as needed   Evelina Dun, FNP

## 2022-04-05 DIAGNOSIS — J3089 Other allergic rhinitis: Secondary | ICD-10-CM | POA: Diagnosis not present

## 2022-04-05 DIAGNOSIS — J301 Allergic rhinitis due to pollen: Secondary | ICD-10-CM | POA: Diagnosis not present

## 2022-04-05 DIAGNOSIS — J3081 Allergic rhinitis due to animal (cat) (dog) hair and dander: Secondary | ICD-10-CM | POA: Diagnosis not present

## 2022-04-12 DIAGNOSIS — J301 Allergic rhinitis due to pollen: Secondary | ICD-10-CM | POA: Diagnosis not present

## 2022-04-12 DIAGNOSIS — J3081 Allergic rhinitis due to animal (cat) (dog) hair and dander: Secondary | ICD-10-CM | POA: Diagnosis not present

## 2022-04-12 DIAGNOSIS — J3089 Other allergic rhinitis: Secondary | ICD-10-CM | POA: Diagnosis not present

## 2022-04-18 ENCOUNTER — Other Ambulatory Visit: Payer: Self-pay | Admitting: Family

## 2022-04-18 DIAGNOSIS — B9689 Other specified bacterial agents as the cause of diseases classified elsewhere: Secondary | ICD-10-CM

## 2022-04-18 DIAGNOSIS — R051 Acute cough: Secondary | ICD-10-CM

## 2022-04-23 ENCOUNTER — Other Ambulatory Visit: Payer: Self-pay | Admitting: Family

## 2022-04-25 ENCOUNTER — Ambulatory Visit: Payer: Medicare PPO | Admitting: Physician Assistant

## 2022-04-25 ENCOUNTER — Encounter: Payer: Self-pay | Admitting: Physician Assistant

## 2022-04-25 VITALS — BP 128/50 | HR 65 | Ht 64.0 in | Wt 161.0 lb

## 2022-04-25 DIAGNOSIS — F0280 Dementia in other diseases classified elsewhere without behavioral disturbance: Secondary | ICD-10-CM | POA: Diagnosis not present

## 2022-04-25 DIAGNOSIS — J3089 Other allergic rhinitis: Secondary | ICD-10-CM | POA: Diagnosis not present

## 2022-04-25 DIAGNOSIS — J301 Allergic rhinitis due to pollen: Secondary | ICD-10-CM | POA: Diagnosis not present

## 2022-04-25 DIAGNOSIS — J3081 Allergic rhinitis due to animal (cat) (dog) hair and dander: Secondary | ICD-10-CM | POA: Diagnosis not present

## 2022-04-25 DIAGNOSIS — G3101 Pick's disease: Secondary | ICD-10-CM | POA: Diagnosis not present

## 2022-04-25 NOTE — Progress Notes (Signed)
Assessment/Plan:    PPA nonfluent variant  Aphasia has progressed patient is essentially non verbal. She uses a notepad for writing. There is difficulty processing fast information in the setting of decreased hearing. Patient declines speech therapy at this time although is recommended  Continue to monitor   Subjective:    This patient is accompanied in the office by  her daughter who supplements the history.  Previous records as well as any outside records available were reviewed prior to todays visit.   This is an 81- yo RH woman with a history of HTN, DM and diabetes with neuropathy, prior vertigo (resolved) , presenting in follow up for evaluation of PPA. Since her last visit in 10/2021 she has slight changes in comprehension if fast information is presented to her. She continues to write on her pad in 3rd person. She tried ST in the past, unclear if it has helped. She does some vocalization at home and if she misses a day, the following day she may experience increased difficulty pronouncing words. She still attends [place] but does not like to go to the sermons. She enjoys word-finding games. She also enjoys western shows. She continues to live alone and to drive short distances without difficulties. For the most part, she is able to participate in her ADLs. No hygiene issues. She is able to manage her meds and finances. She no longer wears glasses, had some eye surgery.   HPI 06/10/2016 for PPA: This is a 81 yo RH woman with a history of hypertension, diabetes, neuropathy, who presented for evaluation of word-finding difficulties. She and her daughter started noticing symptoms for the past couple of years, but worsened after her husband passed away last May 27, 2023. She reports that she knows what she wants to say but it does not come out as it should. Her daughter noticed it was hard to have conversation with her. She started to want to be alone and stay at home due to this. She was started on  an antidepressant, and reports that symptoms did improve a little, but she still struggles to get out the idea of what she wants to say. She has good and bad days. She lives alone and denies getting lost driving. Her daughter though reports she falls asleep driving so her sister is with her for long distance driving. She occasionally forgets her medications and occasionally misplaces things. They could not find their Christmas gifts and got them in July. No missed bill payments. No hygiene issues, no difficulties with ADLs. Her daughter feels that the confusion is a little better, but she is still not comfortable talking to other people. She had an MRI brain without contrast done 04/17/16 for these symptoms which I personally reviewed, no acute changes, there was mild diffuse volume loss.   She has neuropathy with burning and numbness in both feet. She has occasional back pain, no falls. She occasionally has problems swallowing meat. She denies any headaches, dizziness, diplopia, dysarthria, neck pain, focal numbness/tingling/weakness, bowel/bladder dysfunction, anosmia, or tremors. No family history of dementia, no history of significant head injuries. She occasionally drinks alcohol.    Initial evaluation for vertigo March 2023 this is a very pleasant 81 year old right-handed woman with extensive medical history, including PPA nonfluent variant, seen in the past for same, at this point, communicating via pad, and referring herself to third person "Lelon Frohlich", stable from the cognitive standpoint, not seen since 2021.  She had speech therapy with home exercises.  She has a history  of asymmetric hearing loss, followed by ENT, and schedule a visit soon. She is seen today for evaluation of vertigo.  In review, she was at the dentist office during a routine visit, developing after this visit dizziness, and felt that "everything was spinning and moving ".  This vertigo sporadically shows up.  Symptoms are ameliorated by  taking meclizine.  She experiences this about 2-3 times a week, and it can happen even in the middle of the night.  She denies any headaches or vision changes, although if she moves her eyes rapidly, will get vertigo according to her daughter.  She has a corneal procedure soon.  There is no arm weakness or swallowing issues.  Ambulates "a little off balance and uses a walker for stability ".  She had recent cellulitis in the right leg, treated with antibiotics.  No recent long distance trips, no new areas numbness or tingling (she has diabetic PN).  She may have a history of seasonal allergies, and reports that her right nostril "drips water and so that is her right eye always with tears"-daughter says.  Due to the symptoms, she presented to the ED, a CT was performed on 09/26/2021, with negative acute findings.  Other work-up including EKG, UA, CBC, C met was negative as well.  No nystagmus was present during that exam or lateral gaze.  Meclizine 25 mg was given with good relief. As mentioned above, her aphasia is at baseline perhaps with mild worsening of her speech, she now has to "write everything "or names of the stores had to be changed such as [place] is "[place] "., with no other focal deficits.  She still is independent, lives by herself, pays her own bills without missing any payments, and manages her own medications.        PREVIOUS MEDICATIONS:   CURRENT MEDICATIONS:  Outpatient Encounter Medications as of 04/25/2022  Medication Sig   Accu-Chek Softclix Lancets lancets CHECK BLOOD SUGAR ONCE DAILY OR AS DIRECTED Dx E11.40   acetaminophen (TYLENOL) 325 MG tablet Take 2 tablets (650 mg total) by mouth every 6 (six) hours as needed for mild pain, fever or moderate pain.   acyclovir ointment (ZOVIRAX) 5 % APPLY TO THE AFFECTED AREA AROUND MOUTH EVERY 3 HOURS AS DIRECTED UNTIL CLEAR   albuterol (PROVENTIL) (2.5 MG/3ML) 0.083% nebulizer solution Take 3 mLs (2.5 mg total) by nebulization every 6  (six) hours as needed for wheezing or shortness of breath.   albuterol (VENTOLIN HFA) 108 (90 Base) MCG/ACT inhaler Inhale 2 puffs into the lungs every 6 (six) hours as needed for wheezing or shortness of breath.   amLODipine (NORVASC) 5 MG tablet Take 1 tablet (5 mg total) by mouth daily.   aspirin 81 MG chewable tablet Chew 81 mg by mouth at bedtime.   azelastine (ASTELIN) 0.1 % nasal spray Place 2 sprays into both nostrils 2 (two) times daily.   benzonatate (TESSALON PERLES) 100 MG capsule Take 1 capsule (100 mg total) by mouth 3 (three) times daily as needed for cough.   Blood Glucose Monitoring Suppl (ACCU-CHEK AVIVA PLUS) w/Device KIT Check sugars daily & as needed Dx E11.9   cetirizine (ZYRTEC) 10 MG tablet TAKE ONE TABLET ONCE DAILY   Coenzyme Q10 (COQ10 PO) Take 1 tablet by mouth daily.   doxazosin (CARDURA) 8 MG tablet TAKE (1) TABLET DAILY FOR HIGH BLOOD PRESSURE.   EPIPEN 2-PAK 0.3 MG/0.3ML SOAJ injection Inject 0.3 mg into the muscle as needed for anaphylaxis. Reported on  01/24/2016   escitalopram (LEXAPRO) 20 MG tablet Take 1 tablet (20 mg total) by mouth daily. (NEEDS TO BE SEEN BEFORE NEXT REFILL)   glucose blood (ACCU-CHEK AVIVA PLUS) test strip CHECK BLOOD SUGAR ONCE DAILY OR AS DIRECTED Dx E11.40   ipratropium-albuterol (DUONEB) 0.5-2.5 (3) MG/3ML SOLN Take 3 mLs by nebulization 2 (two) times daily.   ketoconazole (NIZORAL) 2 % cream Apply 1 application topically daily.   levothyroxine (SYNTHROID) 75 MCG tablet Take 1 tablet (75 mcg total) by mouth daily before breakfast.   lisinopril (ZESTRIL) 20 MG tablet Take 1 tablet (20 mg total) by mouth daily.   lisinopril (ZESTRIL) 40 MG tablet Take 1 tablet by mouth daily.   loratadine (CLARITIN) 10 MG tablet Take 1 tablet by mouth daily.   meclizine (ANTIVERT) 25 MG tablet Take 1 tablet (25 mg total) by mouth 3 (three) times daily as needed for dizziness.   metFORMIN (GLUCOPHAGE) 1000 MG tablet TAKE  (1)  TABLET TWICE A DAY WITH MEALS  (BREAKFAST AND SUPPER)   metoprolol succinate (TOPROL-XL) 100 MG 24 hr tablet TAKE ONE TABLET ONCE DAILY   montelukast (SINGULAIR) 10 MG tablet Take 1 tablet (10 mg total) by mouth at bedtime.   neomycin-polymyxin b-dexamethasone (MAXITROL) 3.5-10000-0.1 OINT    predniSONE (DELTASONE) 20 MG tablet 2 po at sametime daily for 5 days- start tomorrow   rosuvastatin (CRESTOR) 20 MG tablet Take 1 tablet (20 mg total) by mouth at bedtime.   sitaGLIPtin (JANUVIA) 50 MG tablet Take 1 tablet (50 mg total) by mouth daily.   No facility-administered encounter medications on file as of 04/25/2022.       03/06/2022   12:42 PM 09/17/2017    4:00 PM 06/05/2016   11:22 AM  MMSE - Mini Mental State Exam  Not completed: Unable to complete;Refused    Orientation to time  5 5  Orientation to Place  5 5  Registration  3 3  Attention/ Calculation  5 5  Recall  3 3  Language- name 2 objects  2 2  Language- repeat  0 0  Language- follow 3 step command  3 3  Language- read & follow direction  1 1  Write a sentence  1 1  Copy design  1 1  Total score  29 29      06/10/2016    1:00 PM  Montreal Cognitive Assessment   Visuospatial/ Executive (0/5) 5  Naming (0/3) 3  Attention: Read list of digits (0/2) 1  Attention: Read list of letters (0/1) 0  Attention: Serial 7 subtraction starting at 100 (0/3) 1  Language: Repeat phrase (0/2) 1  Language : Fluency (0/1) 0  Abstraction (0/2) 2  Delayed Recall (0/5) 5  Orientation (0/6) 5  Total 23  Adjusted Score (based on education) 24    Objective:     PHYSICAL EXAMINATION:    VITALS:   Vitals:   04/25/22 1528  BP: (!) 128/50  Pulse: 65  SpO2: 94%  Weight: 161 lb (73 kg)  Height: '5\' 4"'  (1.626 m)    GEN:  The patient appears stated age and is in NAD. HEENT:  Normocephalic, atraumatic.   Neurological examination:  General: NAD, well-groomed, appears stated age. Orientation: The patient is alert. Oriented to person, place and date Cranial  nerves: There is good facial symmetry.The speech is not fluent or clear.  She  tries to repeat some words but there is obvious difficulty . +aphasia, no dysarthria. Fund of knowledge is appropriate.  Recent and remote memory are impaired. Attention and concentration are reduced.  Able to name objects and repeat phrases.  Hearing is intact to conversational tone.    Sensation: Sensation is intact to light touch throughout Motor: Strength is at least antigravity x4. Tremors: none  DTR's 2/4 in UE/LE     Movement examination: Tone: There is normal tone in the UE/LE Abnormal movements:  no tremor.  No myoclonus.  No asterixis.   Coordination:  There is no decremation with RAM's. Slight delay understanding  finger to nose  Gait and Station: The patient has no difficulty arising out of a deep-seated chair without the use of the hands. The patient's stride length is good.  Gait is cautious and narrow.    Thank you for allowing [place] the opportunity to participate in the care of this nice patient. Please do not hesitate to contact us for any questions or concerns.   Total time spent on today's visit was 32 minutes dedicated to this patient today, preparing to see patient, examining the patient, ordering tests and/or medications and counseling the patient, documenting clinical information in the EHR or other health record, independently interpreting results and communicating results to the patient/family, discussing treatment and goals, answering patient's questions and coordinating care.  Cc:  Sharion Balloon, FNP  Sharene Butters 04/25/2022 5:45 PM

## 2022-04-25 NOTE — Patient Instructions (Signed)
Follow in 6 months Continue with your medicines  Continue Lexapro 20 mg daily Consider speech therapy

## 2022-05-02 DIAGNOSIS — J301 Allergic rhinitis due to pollen: Secondary | ICD-10-CM | POA: Diagnosis not present

## 2022-05-02 DIAGNOSIS — J3081 Allergic rhinitis due to animal (cat) (dog) hair and dander: Secondary | ICD-10-CM | POA: Diagnosis not present

## 2022-05-02 DIAGNOSIS — J3089 Other allergic rhinitis: Secondary | ICD-10-CM | POA: Diagnosis not present

## 2022-05-09 DIAGNOSIS — J3081 Allergic rhinitis due to animal (cat) (dog) hair and dander: Secondary | ICD-10-CM | POA: Diagnosis not present

## 2022-05-09 DIAGNOSIS — J3089 Other allergic rhinitis: Secondary | ICD-10-CM | POA: Diagnosis not present

## 2022-05-09 DIAGNOSIS — J301 Allergic rhinitis due to pollen: Secondary | ICD-10-CM | POA: Diagnosis not present

## 2022-05-13 ENCOUNTER — Telehealth: Payer: Self-pay | Admitting: Family

## 2022-05-13 ENCOUNTER — Encounter: Payer: Self-pay | Admitting: Family

## 2022-05-13 ENCOUNTER — Ambulatory Visit (INDEPENDENT_AMBULATORY_CARE_PROVIDER_SITE_OTHER): Payer: Medicare PPO | Admitting: Family

## 2022-05-13 DIAGNOSIS — U071 COVID-19: Secondary | ICD-10-CM

## 2022-05-13 MED ORDER — MOLNUPIRAVIR EUA 200MG CAPSULE: 4.0000 | ORAL_CAPSULE | Freq: Two times a day (BID) | ORAL | 0 refills | Status: AC

## 2022-05-13 NOTE — Telephone Encounter (Signed)
Appt made

## 2022-05-13 NOTE — Progress Notes (Signed)
Virtual Visit  Note Due to COVID-19 pandemic this visit was conducted virtually. This visit type was conducted due to national recommendations for restrictions regarding the COVID-19 Pandemic (e.g. social distancing, sheltering in place) in an effort to limit this patient's exposure and mitigate transmission in our community. All issues noted in this document were discussed and addressed.  A physical exam was not performed with this format.  I connected with [name] on 05/13/22 at 12:55 pm by telephone and verified that I am speaking with the correct person using two identifiers. BUNA CUPPETT is currently located at home and daughter is currently with her during visit. The provider, Evelina Dun, FNP is located in their office at time of visit.  I discussed the limitations, risks, security and privacy concerns of performing an evaluation and management service by telephone and the availability of in person appointments. I also discussed with the patient that there may be a patient responsible charge related to this service. The patient expressed understanding and agreed to proceed.  Ms. macyn, [place] are scheduled for a virtual visit with your provider today.    Just as we do with appointments in the office, we must obtain your consent to participate.  Your consent will be active for this visit and any virtual visit you may have with one of our providers in the next 365 days.    If you have a MyChart account, I can also send a copy of this consent to you electronically.  All virtual visits are billed to your insurance company just like a traditional visit in the office.  As this is a virtual visit, video technology does not allow for your provider to perform a traditional examination.  This may limit your provider's ability to fully assess your condition.  If your provider identifies any concerns that need to be evaluated in person or the need to arrange testing such as labs, EKG,  etc, we will make arrangements to do so.    Although advances in technology are sophisticated, we cannot ensure that it will always work on either your end or our end.  If the connection with a video visit is poor, we may have to switch to a telephone visit.  With either a video or telephone visit, we are not always able to ensure that we have a secure connection.   I need to obtain your verbal consent now.   Are you willing to proceed with your visit today?   DAFNE NIELD has provided verbal consent on 05/13/2022 for a virtual visit (video or telephone).   Evelina Dun, [place] 05/13/2022  1:02 PM   History and Present Illness:  Daughter calls for patient. She has aphasia and is hard to communicate. Daughter was diagnosed with COVID yesterday and patient woke up this morning with cough.  URI  This is a new problem. The current episode started today. The problem has been gradually worsening. There has been no fever. Associated symptoms include congestion, coughing, rhinorrhea, sinus pain and sneezing. Pertinent negatives include no diarrhea, ear pain, headaches, neck pain, sore throat, vomiting or wheezing. She has tried acetaminophen for the symptoms. The treatment provided mild relief.      Review of Systems  HENT:  Positive for congestion, rhinorrhea, sinus pain and sneezing. Negative for ear pain and sore throat.   Respiratory:  Positive for cough. Negative for wheezing.   Gastrointestinal:  Negative for diarrhea and vomiting.  Musculoskeletal:  Negative for neck pain.  Neurological:  Negative for headaches.     Observations/Objective: No SOB or distress noted, nasal congestion   Assessment and Plan: 1. COVID-19 COVID positive, rest, force fluids, tylenol as needed, Quarantine for at least 5 days and you are fever free, then must wear a mask out in public from day 9-57, report any worsening symptoms such as increased shortness of breath, swelling, or continued high fevers.  Possible adverse effects discussed with antivirals.  - molnupiravir EUA (LAGEVRIO) 200 mg CAPS capsule; Take 4 capsules (800 mg total) by mouth 2 (two) times daily for 5 days.  Dispense: 40 capsule; Refill: 0    I discussed the assessment and treatment plan with the patient. The patient was provided an opportunity to ask questions and all were answered. The patient agreed with the plan and demonstrated an understanding of the instructions.   The patient was advised to call back or seek an in-person evaluation if the symptoms worsen or if the condition fails to improve as anticipated.  The above assessment and management plan was discussed with the patient. The patient verbalized understanding of and has agreed to the management plan. Patient is aware to call the clinic if symptoms persist or worsen. Patient is aware when to return to the clinic for a follow-up visit. Patient educated on when it is appropriate to go to the emergency department.   Time call ended:  1:06 pm   I provided 11 minutes of  non face-to-face time during this encounter.    Evelina Dun, FNP

## 2022-05-24 DIAGNOSIS — E1142 Type 2 diabetes mellitus with diabetic polyneuropathy: Secondary | ICD-10-CM | POA: Diagnosis not present

## 2022-05-24 DIAGNOSIS — L84 Corns and callosities: Secondary | ICD-10-CM | POA: Diagnosis not present

## 2022-05-24 DIAGNOSIS — M79676 Pain in unspecified toe(s): Secondary | ICD-10-CM | POA: Diagnosis not present

## 2022-05-24 DIAGNOSIS — B351 Tinea unguium: Secondary | ICD-10-CM | POA: Diagnosis not present

## 2022-05-28 ENCOUNTER — Other Ambulatory Visit: Payer: Self-pay | Admitting: Family

## 2022-05-28 DIAGNOSIS — E1142 Type 2 diabetes mellitus with diabetic polyneuropathy: Secondary | ICD-10-CM

## 2022-06-05 DIAGNOSIS — J301 Allergic rhinitis due to pollen: Secondary | ICD-10-CM | POA: Diagnosis not present

## 2022-06-05 DIAGNOSIS — J3081 Allergic rhinitis due to animal (cat) (dog) hair and dander: Secondary | ICD-10-CM | POA: Diagnosis not present

## 2022-06-05 DIAGNOSIS — J3089 Other allergic rhinitis: Secondary | ICD-10-CM | POA: Diagnosis not present

## 2022-06-12 DIAGNOSIS — J3081 Allergic rhinitis due to animal (cat) (dog) hair and dander: Secondary | ICD-10-CM | POA: Diagnosis not present

## 2022-06-12 DIAGNOSIS — J301 Allergic rhinitis due to pollen: Secondary | ICD-10-CM | POA: Diagnosis not present

## 2022-06-12 DIAGNOSIS — J3089 Other allergic rhinitis: Secondary | ICD-10-CM | POA: Diagnosis not present

## 2022-06-20 DIAGNOSIS — J3089 Other allergic rhinitis: Secondary | ICD-10-CM | POA: Diagnosis not present

## 2022-06-20 DIAGNOSIS — J301 Allergic rhinitis due to pollen: Secondary | ICD-10-CM | POA: Diagnosis not present

## 2022-06-20 DIAGNOSIS — J3081 Allergic rhinitis due to animal (cat) (dog) hair and dander: Secondary | ICD-10-CM | POA: Diagnosis not present

## 2022-06-22 IMAGING — CT CT HEAD W/O CM
4 series · 17 of 47 positions shown, 19 images · non-contrast
Comparison: CT brain 08/05/2019

CLINICAL DATA: Dizziness



[Series 2: head bone · axial · 0.44mm/px · z∈[+985,+1037]mm · 4 of 76 slices shown]
[im 8/76  bone]
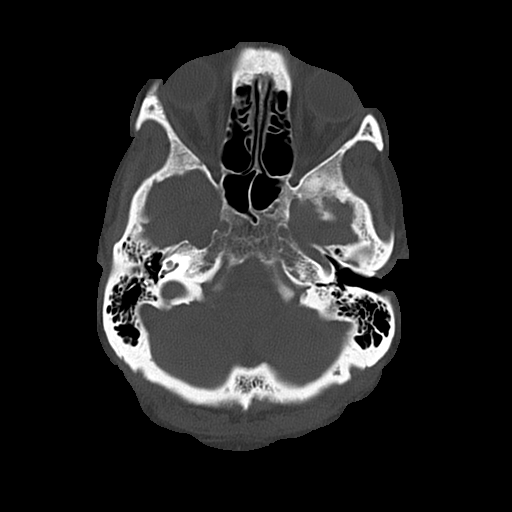
[im 16/76  bone]
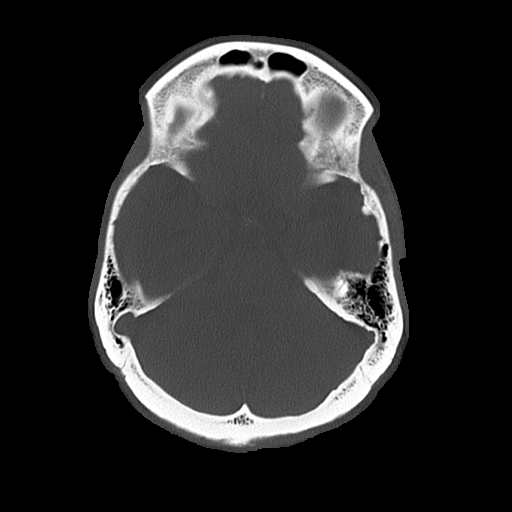
[im 23/76  bone]
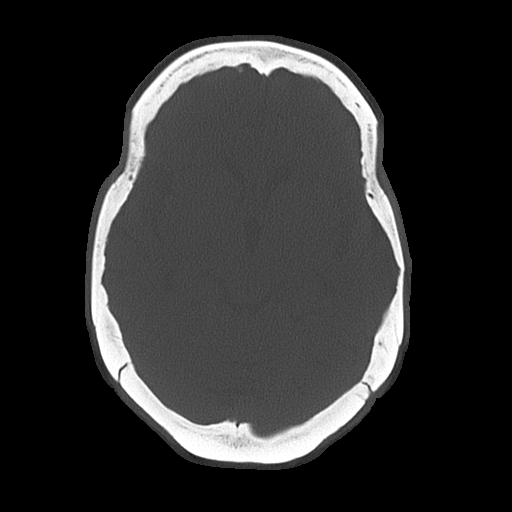
[im 34/76  bone]
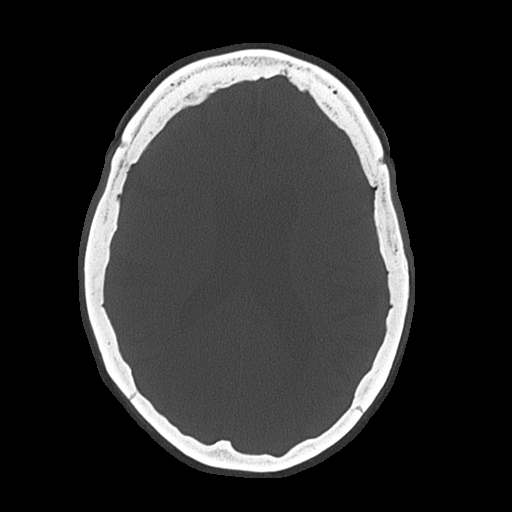

[Series 3: head wo · axial · 0.44mm/px · z∈[+986,+1101]mm · 7 of 31 slices shown, 9 images]
[im 4/31  brain]
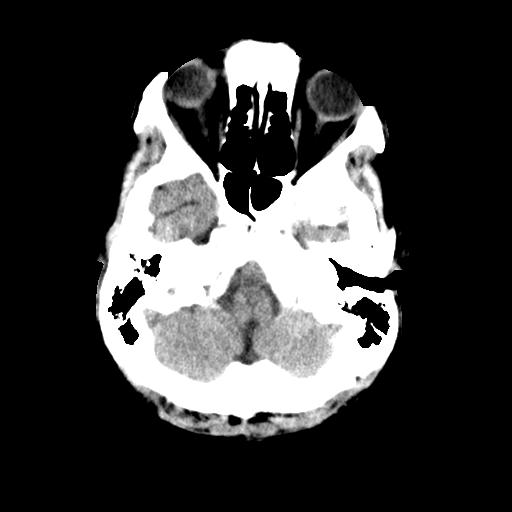
[im 4/31  bone]
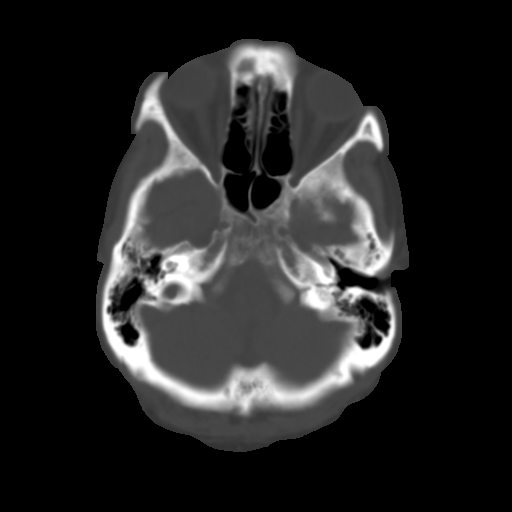
[im 8/31  brain]
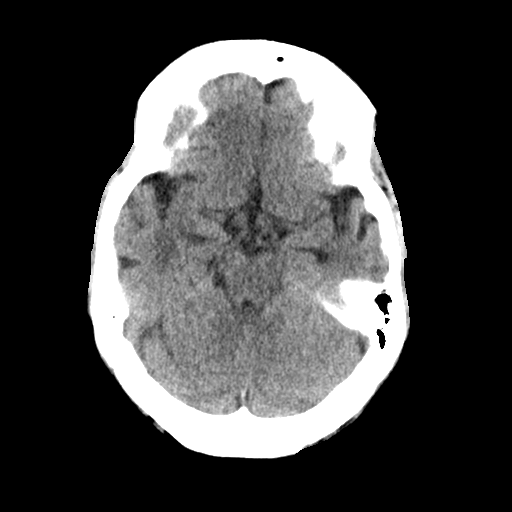
[im 12/31  brain]
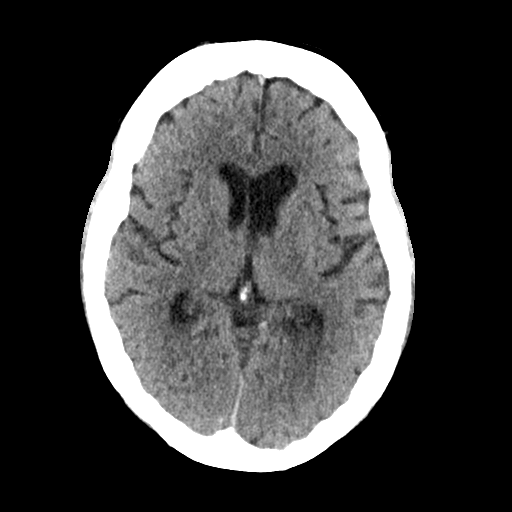
[im 16/31  brain]
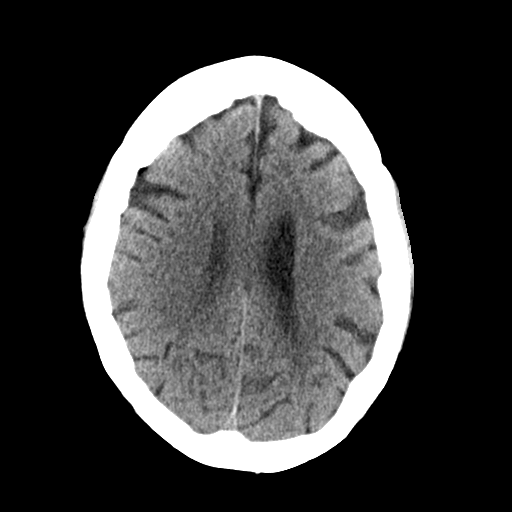
[im 19/31  brain]
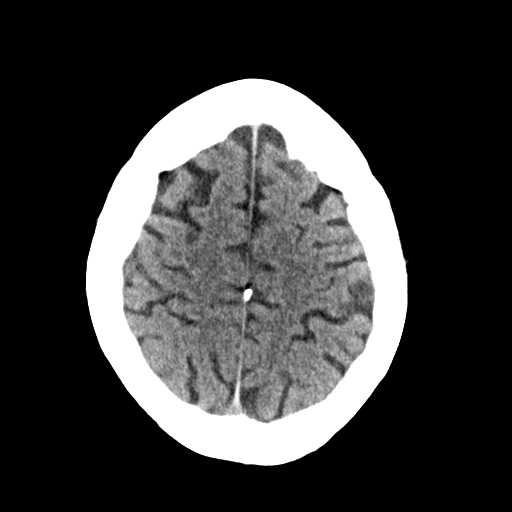
[im 19/31  bone]
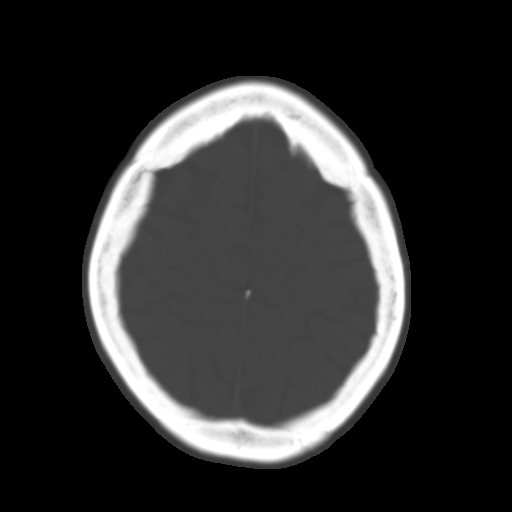
[im 23/31  brain]
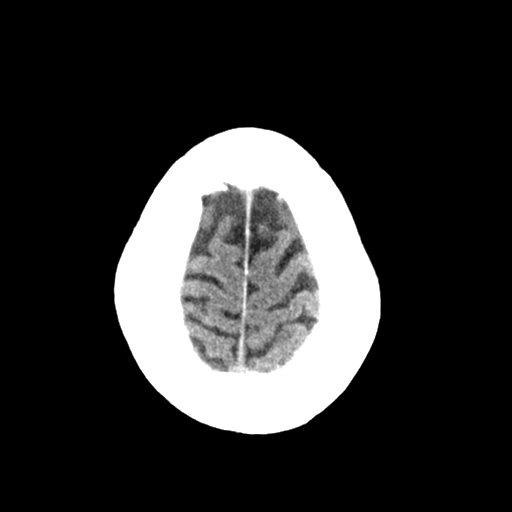
[im 27/31  brain]
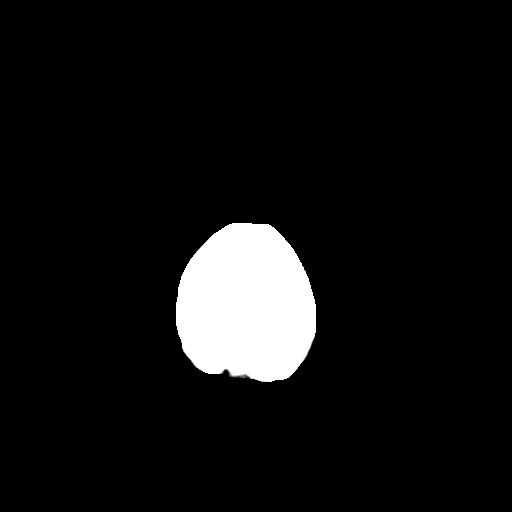

[Series 4: coronal soft · coronal · 0.29mm/px · 3 of 66 slices shown]
[im 22/66  brain]
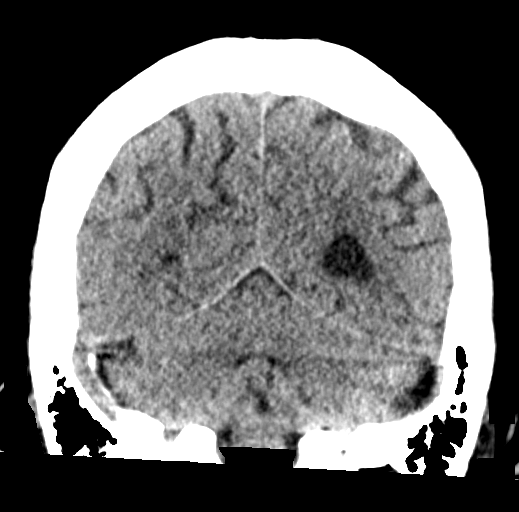
[im 29/66  brain]
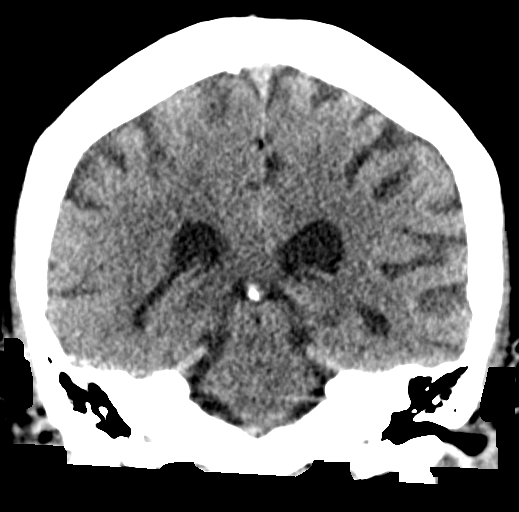
[im 37/66  brain]
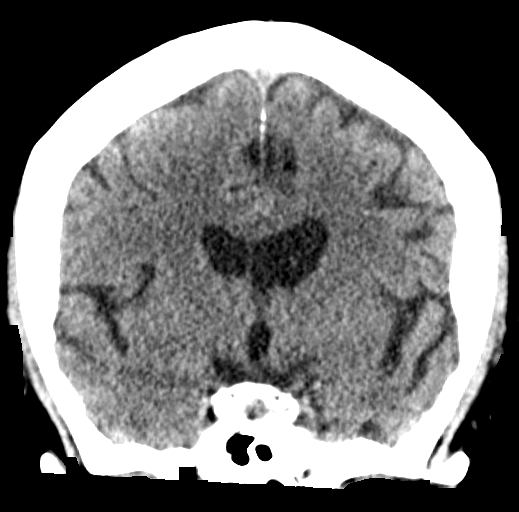

[Series 5: sagittal soft · sagittal · 0.29mm/px · 3 of 51 slices shown]
[im 17/51  brain]
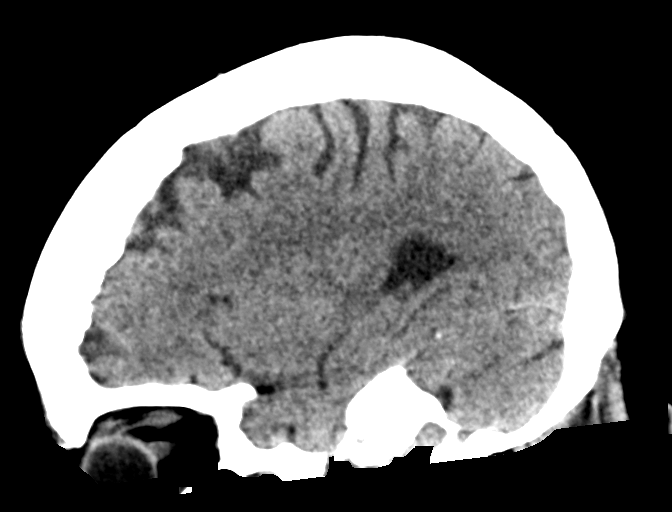
[im 26/51  brain]
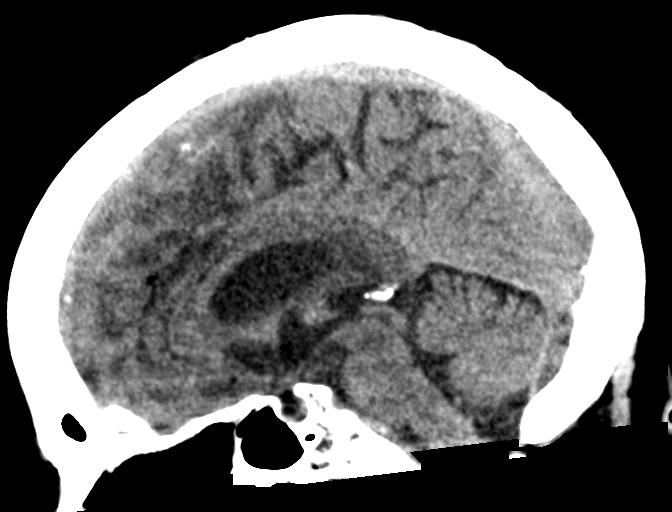
[im 34/51  brain]
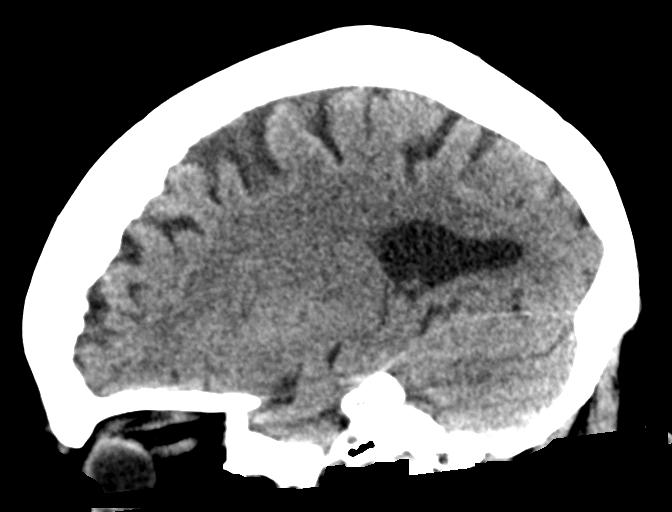

[17 of 47 positions shown; findings below may reference images not displayed]

FINDINGS: Brain: No acute territorial infarction, hemorrhage or intracranial
mass. Mild atrophy. Stable ventricle size.

Vascular: No hyperdense vessels.  Carotid vascular calcification

Skull: Normal. Negative for fracture or focal lesion.

Sinuses/Orbits: No acute finding.

Other: None
IMPRESSION: 1. No CT evidence for acute intracranial abnormality.
2. Mild hypertrophy

## 2022-06-25 ENCOUNTER — Encounter: Payer: Self-pay | Admitting: Family

## 2022-06-25 ENCOUNTER — Ambulatory Visit (INDEPENDENT_AMBULATORY_CARE_PROVIDER_SITE_OTHER): Payer: Medicare PPO | Admitting: Family

## 2022-06-25 DIAGNOSIS — R399 Unspecified symptoms and signs involving the genitourinary system: Secondary | ICD-10-CM

## 2022-06-25 DIAGNOSIS — R410 Disorientation, unspecified: Secondary | ICD-10-CM

## 2022-06-25 DIAGNOSIS — N3000 Acute cystitis without hematuria: Secondary | ICD-10-CM

## 2022-06-25 LAB — MICROSCOPIC EXAMINATION
Renal Epithel, UA: NONE SEEN /hpf
WBC, UA: >30 /hpf — AB (ref 0–5)

## 2022-06-25 LAB — URINALYSIS, COMPLETE
Bilirubin, UA: NEGATIVE
Glucose, UA: NEGATIVE
Nitrite, UA: NEGATIVE
Specific Gravity, UA: 1.015 (ref 1.005–1.030)
Urobilinogen, Ur: 0.2 mg/dL (ref 0.2–1.0)
pH, UA: 7 (ref 5.0–7.5)

## 2022-06-25 MED ORDER — CEPHALEXIN 500 MG PO CAPS: 500.0000 mg | ORAL_CAPSULE | Freq: Two times a day (BID) | ORAL | 0 refills | Status: DC

## 2022-06-25 NOTE — Progress Notes (Signed)
Virtual Visit  Note Due to COVID-19 pandemic this visit was conducted virtually. This visit type was conducted due to national recommendations for restrictions regarding the COVID-19 Pandemic (e.g. social distancing, sheltering in place) in an effort to limit this patient's exposure and mitigate transmission in our community. All issues noted in this document were discussed and addressed.  A physical exam was not performed with this format.  I connected with Kristi Willis on 06/25/22 at 3:20 pm  by telephone and verified that I am speaking with the correct person using two identifiers. Kristi Willis is currently located at home and daughter is currently with her during visit. The provider, Evelina Dun, FNP is located in their office at time of visit.  I discussed the limitations, risks, security and privacy concerns of performing an evaluation and management service by telephone and the availability of in person appointments. I also discussed with the patient that there may be a patient responsible charge related to this service. The patient expressed understanding and agreed to proceed.   Ms. dustin, burrill are scheduled for a virtual visit with your provider today.    Just as we do with appointments in the office, we must obtain your consent to participate.  Your consent will be active for this visit and any virtual visit you may have with one of our providers in the next 365 days.    If you have a MyChart account, I can also send a copy of this consent to you electronically.  All virtual visits are billed to your insurance company just like a traditional visit in the office.  As this is a virtual visit, video technology does not allow for your provider to perform a traditional examination.  This may limit your provider's ability to fully assess your condition.  If your provider identifies any concerns that need to be evaluated in person or the need to arrange testing such as labs, EKG,  etc, we will make arrangements to do so.    Although advances in technology are sophisticated, we cannot ensure that it will always work on either your end or our end.  If the connection with a video visit is poor, we may have to switch to a telephone visit.  With either a video or telephone visit, we are not always able to ensure that we have a secure connection.   I need to obtain your verbal consent now.   Are you willing to proceed with your visit today?   Kristi Willis has provided verbal consent on 06/25/2022 for a virtual visit (video or telephone).   Evelina Dun, [place] 06/25/2022  3:23 PM   History and Present Illness:  Daughter and patient calls the office today increased confusion. She took her flu, RSV and allergy vaccine on Friday. Pt has aphasia. Denies any fever.  Dysuria  This is a new problem. The current episode started in the past 7 days. The problem occurs intermittently. The pain is mild. Associated symptoms include urgency. Pertinent negatives include no hematuria or nausea.      Review of Systems  Gastrointestinal:  Negative for nausea.  Genitourinary:  Positive for dysuria and urgency. Negative for hematuria.     Observations/Objective: No SOB or distress noted   Assessment and Plan: 1. Confused - Urinalysis, Complete - Urine Culture  2. UTI symptoms  - cephALEXin (KEFLEX) 500 MG capsule; Take 1 capsule (500 mg total) by mouth 2 (two) times daily.  Dispense: 14 capsule; Refill: 0  3. Acute cystitis without hematuria Force fluids AZO over the counter X2 days Follow up if symptoms worsen or do not improve  Culture pending  - cephALEXin (KEFLEX) 500 MG capsule; Take 1 capsule (500 mg total) by mouth 2 (two) times daily.  Dispense: 14 capsule; Refill: 0     I discussed the assessment and treatment plan with the patient. The patient was provided an opportunity to ask questions and all were answered. The patient agreed with the plan and demonstrated  an understanding of the instructions.   The patient was advised to call back or seek an in-person evaluation if the symptoms worsen or if the condition fails to improve as anticipated.  The above assessment and management plan was discussed with the patient. The patient verbalized understanding of and has agreed to the management plan. Patient is aware to call the clinic if symptoms persist or worsen. Patient is aware when to return to the clinic for a follow-up visit. Patient educated on when it is appropriate to go to the emergency department.   Time call ended:  3:33 pm   I provided 13 minutes of  non face-to-face time during this encounter.    Evelina Dun, FNP

## 2022-06-27 LAB — URINE CULTURE

## 2022-07-01 ENCOUNTER — Encounter: Payer: Self-pay | Admitting: Family

## 2022-07-01 ENCOUNTER — Ambulatory Visit: Payer: Medicare PPO | Admitting: Family

## 2022-07-01 ENCOUNTER — Ambulatory Visit (INDEPENDENT_AMBULATORY_CARE_PROVIDER_SITE_OTHER): Payer: Medicare PPO

## 2022-07-01 VITALS — BP 158/59 | HR 64 | Temp 98.0°F | Ht 64.0 in | Wt 161.0 lb

## 2022-07-01 DIAGNOSIS — J219 Acute bronchiolitis, unspecified: Secondary | ICD-10-CM

## 2022-07-01 DIAGNOSIS — R051 Acute cough: Secondary | ICD-10-CM | POA: Diagnosis not present

## 2022-07-01 DIAGNOSIS — J9811 Atelectasis: Secondary | ICD-10-CM | POA: Diagnosis not present

## 2022-07-01 DIAGNOSIS — R062 Wheezing: Secondary | ICD-10-CM

## 2022-07-01 DIAGNOSIS — J4521 Mild intermittent asthma with (acute) exacerbation: Secondary | ICD-10-CM | POA: Diagnosis not present

## 2022-07-01 DIAGNOSIS — R059 Cough, unspecified: Secondary | ICD-10-CM | POA: Diagnosis not present

## 2022-07-01 MED ORDER — PREDNISONE 10 MG (21) PO TBPK: ORAL_TABLET | ORAL | 0 refills | Status: DC

## 2022-07-01 MED ORDER — BENZONATATE 200 MG PO CAPS: 200.0000 mg | ORAL_CAPSULE | Freq: Three times a day (TID) | ORAL | 1 refills | Status: DC | PRN

## 2022-07-01 MED ORDER — ALBUTEROL SULFATE HFA 108 (90 BASE) MCG/ACT IN AERS: 2.0000 | INHALATION_SPRAY | Freq: Four times a day (QID) | RESPIRATORY_TRACT | 3 refills | Status: DC | PRN

## 2022-07-01 NOTE — Patient Instructions (Signed)
Asthma, Adult ? ?Asthma is a long-term (chronic) condition that causes recurrent episodes in which the lower airways in the lungs become tight and narrow. The narrowing is caused by inflammation and tightening of the smooth muscle around the lower airways. ?Asthma episodes, also called asthma attacks or asthma flares, may cause coughing, making high-pitched whistling sounds when you breathe, most often when you breathe out (wheezing), shortness of breath, and chest pain. The airways may produce extra mucus caused by the inflammation and irritation. During an attack, it can be difficult to breathe. Asthma attacks can range from minor to life-threatening. ?Asthma cannot be cured, but medicines and lifestyle changes can help control it and treat acute attacks. It is important to keep your asthma well controlled so the condition does not interfere with your daily life. ?What are the causes? ?This condition is believed to be caused by inherited (genetic) and environmental factors, but its exact cause is not known. ?What can trigger an asthma attack? ?Many things can bring on an asthma attack or make symptoms worse. These triggers are different for every person. Common triggers include: ?Allergens and irritants like mold, dust, pet dander, cockroaches, pollen, air pollution, and chemical odors. ?Cigarette smoke. ?Weather changes and cold air. ?Stress and strong emotional responses such as crying or laughing hard. ?Certain medications such as aspirin or beta blockers. ?Infections and inflammatory conditions, such as the flu, a cold, pneumonia, or inflammation of the nasal membranes (rhinitis). ?Gastroesophageal reflux disease (GERD). ?What are the signs or symptoms? ?Symptoms may occur right after exposure to an asthma trigger or hours later and can vary by person. Common signs and symptoms include: ?Wheezing. ?Trouble breathing (shortness of breath). ?Excessive nighttime or early morning coughing. ?Chest  tightness. ?Tiredness (fatigue) with minimal activity. ?Difficulty talking in complete sentences. ?Poor exercise tolerance. ?How is this diagnosed? ?This condition is diagnosed based on: ?A physical exam and your medical history. ?Tests, which may include: ?Lung function studies to evaluate the flow of air in your lungs. ?Allergy tests. ?Imaging tests, such as X-rays. ?How is this treated? ?There is no cure, but symptoms can be controlled with proper treatment. Treatment usually involves: ?Identifying and avoiding your asthma triggers. ?Inhaled medicines. Two types are commonly used to treat asthma, depending on severity: ?Controller medicines. These help prevent asthma symptoms from occurring. They are taken every day. ?Fast-acting reliever or rescue medicines. These quickly relieve asthma symptoms. They are used as needed and provide short-term relief. ?Using other medicines, such as: ?Allergy medicines, such as antihistamines, if your asthma attacks are triggered by allergens. ?Immune medicines (immunomodulators). These are medicines that help control the immune system. ?Using supplemental oxygen. This is only needed during a severe episode. ?Creating an asthma action plan. An asthma action plan is a written plan for managing and treating your asthma attacks. This plan includes: ?A list of your asthma triggers and how to avoid them. ?Information about when medicines should be taken and when their dosage should be changed. ?Instructions about using a device called a peak flow meter. A peak flow meter measures how well the lungs are working and the severity of your asthma. It helps you monitor your condition. ?Follow these instructions at home: ?Take over-the-counter and prescription medicines only as told by your health care provider. ?Stay up to date on all vaccinations as recommended by your healthcare provider, including vaccines for the flu and pneumonia. ?Use a peak flow meter and keep track of your peak flow  readings. ?Understand and use your asthma   action plan to address any asthma flares. ?Do not smoke or allow anyone to smoke in your home. ?Contact a health care provider if: ?You have wheezing, shortness of breath, or a cough that is not responding to medicines. ?Your medicines are causing side effects, such as a rash, itching, swelling, or trouble breathing. ?You need to use a reliever medicine more than 2-3 times a week. ?Your peak flow reading is still at 50-79% of your personal best after following your action plan for 1 hour. ?You have a fever and shortness of breath. ?Get help right away if: ?You are getting worse and do not respond to treatment during an asthma attack. ?You are short of breath when at rest or when doing very little physical activity. ?You have difficulty eating, drinking, or talking. ?You have chest pain or tightness. ?You develop a fast heartbeat or palpitations. ?You have a bluish color to your lips or fingernails. ?You are light-headed or dizzy, or you faint. ?Your peak flow reading is less than 50% of your personal best. ?You feel too tired to breathe normally. ?These symptoms may be an emergency. Get help right away. Call 911. ?Do not wait to see if the symptoms will go away. ?Do not drive yourself to the hospital. ?Summary ?Asthma is a long-term (chronic) condition that causes recurrent episodes in which the airways become tight and narrow. Asthma episodes, also called asthma attacks or asthma flares, can cause coughing, wheezing, shortness of breath, and chest pain. ?Asthma cannot be cured, but medicines and lifestyle changes can help keep it well controlled and prevent asthma flares. ?Make sure you understand how to avoid triggers and how and when to use your medicines. ?Asthma attacks can range from minor to life-threatening. Get help right away if you have an asthma attack and do not respond to treatment with your usual rescue medicines. ?This information is not intended to replace  advice given to you by your health care provider. Make sure you discuss any questions you have with your health care provider. ?Document Revised: 05/23/2021 Document Reviewed: 05/14/2021 ?Elsevier Patient Education ? 2023 [employer] Inc[employer] ? ?

## 2022-07-01 NOTE — Telephone Encounter (Signed)
Daughter concerned mother may have pneumonia. Daughter aware Lenna Gilford is booked. Please call back

## 2022-07-01 NOTE — Progress Notes (Signed)
Subjective:    Patient ID: Kristi Willis, female    DOB: 1941-05-23, 81 y.o.   MRN: [id number]  Chief Complaint  Patient presents with   Pneumonia    Cough This is a new problem. The current episode started 1 to 4 weeks ago. The problem has been gradually worsening. The problem occurs every few minutes. The cough is Non-productive. Associated symptoms include a fever, nasal congestion, shortness of breath and wheezing. Pertinent negatives include no chills, ear congestion, ear pain or headaches. She has tried rest and OTC cough suppressant for the symptoms. The treatment provided mild relief. Her past medical history is significant for asthma.  Asthma She complains of cough, difficulty breathing, frequent throat clearing, shortness of breath and wheezing. This is a recurrent problem. The current episode started 1 to 4 weeks ago. The problem occurs intermittently. Associated symptoms include a fever and nasal congestion. Pertinent negatives include no ear congestion, ear pain or headaches. Her past medical history is significant for asthma.      Review of Systems  Constitutional:  Positive for fever. Negative for chills.  HENT:  Negative for ear pain.   Respiratory:  Positive for cough, shortness of breath and wheezing.   Neurological:  Negative for headaches.  All other systems reviewed and are negative.      Objective:   Physical Exam Vitals reviewed.  Constitutional:      General: She is not in acute distress.    Appearance: She is well-developed.  HENT:     Head: Normocephalic and atraumatic.     Right Ear: Tympanic membrane normal.     Left Ear: Tympanic membrane normal.  Eyes:     Pupils: Pupils are equal, round, and reactive to light.  Neck:     Thyroid: No thyromegaly.  Cardiovascular:     Rate and Rhythm: Normal rate and regular rhythm.     Heart sounds: Normal heart sounds. No murmur heard. Pulmonary:     Effort: Pulmonary effort is normal. No respiratory  distress.     Breath sounds: Wheezing present.  Abdominal:     General: Bowel sounds are normal. There is no distension.     Palpations: Abdomen is soft.     Tenderness: There is no abdominal tenderness.  Musculoskeletal:        General: No tenderness. Normal range of motion.     Cervical back: Normal range of motion and neck supple.  Skin:    General: Skin is warm and dry.  Neurological:     Mental Status: She is alert and oriented to person, place, and time.     Cranial Nerves: No cranial nerve deficit.     Deep Tendon Reflexes: Reflexes are normal and symmetric.  Psychiatric:        Behavior: Behavior normal.        Thought Content: Thought content normal.        Judgment: Judgment normal.     BP (!) 158/59   Pulse 64   Temp 98 F (36.7 C) (Temporal)   Ht '5\' 4"'$  (1.626 m)   Wt 161 lb (73 kg)   SpO2 94%   BMI 27.64 kg/m        Assessment & Plan:  BAYLOR TEEGARDEN comes in today with chief complaint of Pneumonia   Diagnosis and orders addressed:  1. Acute cough  - DG Chest 2 View; Future - predniSONE (STERAPRED UNI-PAK 21 TAB) 10 MG (21) TBPK tablet; Use as directed  Dispense: 21 tablet; Refill: 0 - benzonatate (TESSALON) 200 MG capsule; Take 1 capsule (200 mg total) by mouth 3 (three) times daily as needed.  Dispense: 30 capsule; Refill: 1  2. Mild intermittent asthma with acute exacerbation - predniSONE (STERAPRED UNI-PAK 21 TAB) 10 MG (21) TBPK tablet; Use as directed  Dispense: 21 tablet; Refill: 0 - benzonatate (TESSALON) 200 MG capsule; Take 1 capsule (200 mg total) by mouth 3 (three) times daily as needed.  Dispense: 30 capsule; Refill: 1  3. Wheezing - albuterol (VENTOLIN HFA) 108 (90 Base) MCG/ACT inhaler; Inhale 2 puffs into the lungs every 6 (six) hours as needed for wheezing or shortness of breath.  Dispense: 1 each; Refill: 3 - benzonatate (TESSALON) 200 MG capsule; Take 1 capsule (200 mg total) by mouth 3 (three) times daily as needed.  Dispense:  30 capsule; Refill: 1  - Take meds as prescribed - Use a cool mist humidifier  -Use saline nose sprays frequently -Force fluids -For any cough or congestion  Use plain Mucinex- regular strength or max strength is fine -For fever or aces or pains- take tylenol or ibuprofen. -Throat lozenges if help -Follow up if symptoms worsen or do not improve   Evelina Dun, FNP

## 2022-07-02 ENCOUNTER — Other Ambulatory Visit: Payer: Self-pay | Admitting: Family

## 2022-07-02 DIAGNOSIS — R911 Solitary pulmonary nodule: Secondary | ICD-10-CM

## 2022-07-04 ENCOUNTER — Ambulatory Visit: Payer: Medicare PPO | Admitting: Family

## 2022-07-04 ENCOUNTER — Encounter: Payer: Self-pay | Admitting: Family

## 2022-07-04 VITALS — BP 127/46 | HR 60 | Temp 97.6°F | Ht 64.0 in | Wt 163.6 lb

## 2022-07-04 DIAGNOSIS — B9689 Other specified bacterial agents as the cause of diseases classified elsewhere: Secondary | ICD-10-CM

## 2022-07-04 DIAGNOSIS — J208 Acute bronchitis due to other specified organisms: Secondary | ICD-10-CM

## 2022-07-04 MED ORDER — DOXYCYCLINE HYCLATE 100 MG PO TABS: 100.0000 mg | ORAL_TABLET | Freq: Two times a day (BID) | ORAL | 0 refills | Status: DC

## 2022-07-04 MED ORDER — AMOXICILLIN-POT CLAVULANATE 875-125 MG PO TABS: 1.0000 | ORAL_TABLET | Freq: Two times a day (BID) | ORAL | 0 refills | Status: DC

## 2022-07-04 NOTE — Patient Instructions (Signed)
Community-Acquired Pneumonia, Adult Pneumonia is a lung infection that causes inflammation and the buildup of mucus and fluids in the lungs. This may cause coughing and difficulty breathing. Community-acquired pneumonia is pneumonia that develops in people who are not, and have not recently been, in a hospital or other health care facility. Usually, pneumonia develops as a result of an illness that is caused by a virus, such as the common cold and the flu (influenza). It can also be caused by bacteria or fungi. While the common cold and influenza can pass from person to person (are contagious), pneumonia itself is not considered contagious. What are the causes? This condition may be caused by: Viruses. Bacteria. Fungi. What increases the risk? The following factors may make you more likely to develop this condition: Being over age 65 or having certain medical conditions, such as: A long-term (chronic) disease, such as: chronic obstructive pulmonary disease (COPD), asthma, heart failure, diabetes, or kidney disease. A condition that increases the risk of breathing in (aspirating) mucus and other fluids from your mouth and nose. A weakened body defense system (immune system). Having had your spleen removed (splenectomy). The spleen is the organ that helps fight germs and infections. Not cleaning your teeth and gums well (poor dental hygiene). Using tobacco products. Traveling to places where germs that cause pneumonia are present or being near certain animals or animal habitats that could have germs that cause pneumonia. What are the signs or symptoms? Symptoms of this condition include: A dry cough or a wet (productive) cough. A fever, sweating, or chills. Chest pain, especially when breathing deeply or coughing. Fast breathing, difficulty breathing, or shortness of breath. Tiredness (fatigue) and muscle aches. How is this diagnosed? This condition may be diagnosed based on your medical  history or a physical exam. You may also have tests, including: Imaging, such as a chest X-ray or lung ultrasound. Tests of: The level of oxygen and other gases in your blood. Mucus from your lungs (sputum). Fluid around your lungs (pleural fluid). Your urine. How is this treated? Treatment for this condition depends on many factors, such as the cause of your pneumonia, your medicines, and other medical conditions that you have. For most adults, pneumonia may be treated at home. In some cases, treatment must happen in a hospital and may include: Medicines that are given by mouth (orally) or through an IV, including: Antibiotic medicines, if bacteria caused the pneumonia. Medicines that kill viruses (antiviral medicines), if a virus caused the pneumonia. Oxygen therapy. Severe pneumonia, although rare, may require the following treatments: Mechanical ventilation.This procedure uses a machine to help you breathe if you cannot breathe well on your own or maintain a safe level of blood oxygen. Thoracentesis. This procedure removes any buildup of pleural fluid to help with breathing. Follow these instructions at home:  Medicines Take over-the-counter and prescription medicines only as told by your health care provider. Take cough medicine only if you have trouble sleeping. Cough medicine can prevent your body from removing mucus from your lungs. If you were prescribed antibiotics, take them as told by your health care provider. Do not stop taking the antibiotic even if you start to feel better. Lifestyle     Do not drink alcohol. Do not use any products that contain nicotine or tobacco. These products include cigarettes, chewing tobacco, and vaping devices, such as e-cigarettes. If you need help quitting, ask your health care provider. Eat a healthy diet. This includes plenty of vegetables, fruits, whole grains, low-fat   dairy products, and lean protein. General instructions Rest a lot and  get at least 8 hours of sleep each night. Sleep in a partly upright position at night. Place a few pillows under your head or sleep in a reclining chair. Return to your normal activities as told by your health care provider. Ask your health care provider what activities are safe for you. Drink enough fluid to keep your urine pale yellow. This helps to thin the mucus in your lungs. If your throat is sore, gargle with a mixture of salt and water 3-4 times a day or as needed. To make salt water, completely dissolve -1 tsp (3-6 g) of salt in 1 cup (237 mL) of warm water. Keep all follow-up visits. How is this prevented? You can lower your risk of developing community-acquired pneumonia by: Getting the pneumonia vaccine. There are different types and schedules of pneumonia vaccines. Ask your health care provider which option is best for you. Consider getting the pneumonia vaccine if: You are older than 81 years of age. You are 19-65 years of age and are receiving cancer treatment, have chronic lung disease, or have other medical conditions that affect your immune system. Ask your health care provider if this applies to you. Getting your influenza vaccine every year. Ask your health care provider which type of vaccine is best for you. Getting regular dental checkups. Washing your hands often with soap and water for at least 20 seconds. If soap and water are not available, use hand sanitizer. Contact a health care provider if: You have a fever. You have trouble sleeping because you cannot control your cough with cough medicine. Get help right away if: Your shortness of breath becomes worse. Your chest pain increases. Your sickness becomes worse, especially if you are an older adult or have a weak immune system. You cough up blood. These symptoms may be an emergency. Get help right away. Call 911. Do not wait to see if the symptoms will go away. Do not drive yourself to the  hospital. Summary Pneumonia is an infection of the lungs. Community-acquired pneumonia develops in people who have not been in the hospital. It can be caused by bacteria, viruses, or fungi. This condition may be treated with antibiotics or antiviral medicines. Severe pneumonia may require a hospital stay and treatment to help with breathing. This information is not intended to replace advice given to you by your health care provider. Make sure you discuss any questions you have with your health care provider. Document Revised: 10/03/2021 Document Reviewed: 10/03/2021 Elsevier Patient Education  2023 [employer] Inc.  

## 2022-07-04 NOTE — Progress Notes (Signed)
Subjective:    Patient ID: Kristi Willis, female    DOB: [birth year], 81 y.o.   MRN: [id number]  Chief Complaint  Patient presents with   Follow-up   Pt presents to the office today to discuss x-ray. She reports she has had a cough over the last two weeks. She was seen on 11/13 and had a x-ray that showed, "New scattered areas of subtle nodularity. Recommend chest CT for further evaluation". She was started on prednisone.   She has a CT scan pending for Nov 22.  Cough This is a new problem. The current episode started 1 to 4 weeks ago. The problem has been waxing and waning. The problem occurs every few minutes. The cough is Non-productive. Associated symptoms include nasal congestion, shortness of breath and wheezing. Pertinent negatives include no chills or ear congestion. Treatments tried: prednisone.      Review of Systems  Constitutional:  Negative for chills.  Respiratory:  Positive for cough, shortness of breath and wheezing.   All other systems reviewed and are negative.      Objective:   Physical Exam Vitals reviewed.  Constitutional:      General: She is not in acute distress.    Appearance: She is well-developed.  HENT:     Head: Normocephalic and atraumatic.  Eyes:     Pupils: Pupils are equal, round, and reactive to light.  Neck:     Thyroid: No thyromegaly.  Cardiovascular:     Rate and Rhythm: Normal rate and regular rhythm.     Heart sounds: Normal heart sounds. No murmur heard. Pulmonary:     Effort: Pulmonary effort is normal. No respiratory distress.     Breath sounds: No stridor. Rhonchi present. No wheezing.  Abdominal:     General: Bowel sounds are normal. There is no distension.     Palpations: Abdomen is soft.     Tenderness: There is no abdominal tenderness.  Musculoskeletal:        General: No tenderness. Normal range of motion.     Cervical back: Normal range of motion and neck supple.  Skin:    General: Skin is warm and dry.   Neurological:     Mental Status: She is alert and oriented to person, place, and time.     Cranial Nerves: No cranial nerve deficit.     Deep Tendon Reflexes: Reflexes are normal and symmetric.  Psychiatric:        Behavior: Behavior normal.        Thought Content: Thought content normal.        Judgment: Judgment normal.       BP (!) 127/46   Pulse 60   Temp 97.6 F (36.4 C) (Temporal)   Ht '5\' 4"'$  (1.626 m)   Wt 163 lb 9.6 oz (74.2 kg)   SpO2 95%   BMI 28.08 kg/m      Assessment & Plan:   Kristi Willis comes in today with chief complaint of Follow-up   Diagnosis and orders addressed:  1. Acute bacterial bronchitis - Take meds as prescribed - Use a cool mist humidifier  -Use saline nose sprays frequently -Force fluids -For any cough or congestion  Use plain Mucinex- regular strength or max strength is fine -For fever or aces or pains- take tylenol or ibuprofen. -Throat lozenges if help -Keep CT appointment  Follow up in 2 weeks  - amoxicillin-clavulanate (AUGMENTIN) 875-125 MG tablet; Take 1 tablet by mouth 2 (two) times daily.  Dispense: 14 tablet; Refill: 0 - doxycycline (VIBRA-TABS) 100 MG tablet; Take 1 tablet (100 mg total) by mouth 2 (two) times daily.  Dispense: 20 tablet; Refill: 0   Evelina Dun, FNP

## 2022-07-08 NOTE — Progress Notes (Unsigned)
Cardiology Clinic Note   Patient Name: Kristi Willis Date of Encounter: 07/09/2022  Primary Care Provider:  Sharion Balloon, FNP Primary Cardiologist:  Kristi Heinz, MD  Patient Profile    [name] is an 81 year-old female with a past medical history of atrial tachycardia, chest pain, lower extremity edema, hypertension, hyperlipidemia, aphasia, T2DM who presents to the clinic today for 1 year follow-up of chronic cardiac conditions.   Past Medical History    Past Medical History:  Diagnosis Date   Allergy    Asthma    Cancer ([place])    skin   Diabetes mellitus    GERD (gastroesophageal reflux disease)    hiatal hernia   Hypercholesteremia    Hypertension    Neuromuscular disorder (HCC)    DM neuropathy   Neuropathy    SVT (supraventricular tachycardia)    Past Surgical History:  Procedure Laterality Date   BREAST BIOPSY     left breast biopsy   LUMBAR LAMINECTOMY  08/10   SKIN CANCER EXCISION  2005   SPINE SURGERY     lumbar laminectomy    Allergies  Allergies  Allergen Reactions   Dimetapp C [Phenylephrine-Bromphen-Codeine] Itching   Phenylephrine Hcl Itching   Shingrix [Zoster Vac Recomb Adjuvanted] Other (See Comments)    Swelling and fever    Ultram [Tramadol Hcl] Other (See Comments)    Insomnia   Celebrex [Celecoxib] Rash   Cortisone Rash   Fenofibrate Rash   Sulfa Antibiotics Rash    History of Present Illness    [name] has a past medical history of: Chest pain.  Nuclear stress test 11/09/2019: Normal low risk study.  Echo 02/11/2021: EF 60-65%, mild LVH, moderately dilated left atrium, mildly dilated right atrium. Trivial mitral valve regurgitation, moderate mitral annular calcification.  Lower extremity edema.  Bilateral lower extremity venous US 07/28/2019: No DVT.  PRN lasix.  Atrial tachycardia.  Hypertension.  Hyperlipidemia.  Aphasia.   Patient followed by cardiology for atrial tachycardia. She was last seen  by Kristi Willis in the office on 01/24/2021 for intermittent dyspnea and lightheadedness. She also reported occasional lower extremity edema. Echo showed EF 60-65%, mild LVH, moderately dilated left atrium, mildly dilated right atrium. Trivial mitral valve regurgitation, moderate mitral annular calcification.   Today, patient is accompanied by her daughter, Kristi Willis. Patient is aphasic so Kristi Willis relays history and answers all questions. Kristi Willis reports patient has shortness of breath and chest pain. When confirming with patient about chest pain she indicates she does not have pain but has shortness of breath. Patient was diagnosed with a UTI on 06/25/2022. When she completes her course of antibiotic she began having cough and increased shortness of breath. PCP ruled out PNA and prescribed steroids and cough medication with close follow-up secondary to asthma history on 08/31/2021. When patient returned without relief three days later she was diagnosed with bacterial bronchitis and prescribed Augmentin and doxycycline. Since that time, Kristi Willis feels patient's cough is very slowly improving but she feels her energy is still very low. Kristi Willis reports patient is independent with ADLs and some light tasks around the home but since UTI and bronchitis she has not been completing these tasks. She reports occasional lower extremity edema.   Home Medications    Current Meds  Medication Sig   Accu-Chek Softclix Lancets lancets CHECK BLOOD SUGAR ONCE DAILY OR AS DIRECTED Dx E11.40   acetaminophen (TYLENOL) 325 MG tablet Take 2 tablets (650 mg total) by mouth every  6 (six) hours as needed for mild pain, fever or moderate pain.   acyclovir ointment (ZOVIRAX) 5 % APPLY TO THE AFFECTED AREA AROUND MOUTH EVERY 3 HOURS AS DIRECTED UNTIL CLEAR   albuterol (PROVENTIL) (2.5 MG/3ML) 0.083% nebulizer solution Take 3 mLs (2.5 mg total) by nebulization every 6 (six) hours as needed for wheezing or shortness of breath.   albuterol (VENTOLIN  HFA) 108 (90 Base) MCG/ACT inhaler Inhale 2 puffs into the lungs every 6 (six) hours as needed for wheezing or shortness of breath.   amLODipine (NORVASC) 5 MG tablet Take 1 tablet (5 mg total) by mouth daily.   amoxicillin-clavulanate (AUGMENTIN) 875-125 MG tablet Take 1 tablet by mouth 2 (two) times daily.   aspirin 81 MG chewable tablet Chew 81 mg by mouth at bedtime.   azelastine (ASTELIN) 0.1 % nasal spray Place 2 sprays into both nostrils 2 (two) times daily.   benzonatate (TESSALON) 200 MG capsule Take 1 capsule (200 mg total) by mouth 3 (three) times daily as needed.   Blood Glucose Monitoring Suppl (ACCU-CHEK AVIVA PLUS) w/Device KIT Check sugars daily & as needed Dx E11.9   cetirizine (ZYRTEC) 10 MG tablet TAKE ONE TABLET ONCE DAILY   Coenzyme Q10 (COQ10 PO) Take 1 tablet by mouth daily.   doxazosin (CARDURA) 8 MG tablet TAKE (1) TABLET DAILY FOR HIGH BLOOD PRESSURE.   doxycycline (VIBRA-TABS) 100 MG tablet Take 1 tablet (100 mg total) by mouth 2 (two) times daily.   EPIPEN 2-PAK 0.3 MG/0.3ML SOAJ injection Inject 0.3 mg into the muscle as needed for anaphylaxis. Reported on 01/24/2016   escitalopram (LEXAPRO) 20 MG tablet Take 1 tablet (20 mg total) by mouth daily. (NEEDS TO BE SEEN BEFORE NEXT REFILL)   glucose blood (ACCU-CHEK AVIVA PLUS) test strip CHECK BLOOD SUGAR ONCE DAILY OR AS DIRECTED Dx E11.40   ipratropium-albuterol (DUONEB) 0.5-2.5 (3) MG/3ML SOLN Take 3 mLs by nebulization 2 (two) times daily.   ketoconazole (NIZORAL) 2 % cream Apply 1 application topically daily.   levothyroxine (SYNTHROID) 75 MCG tablet Take 1 tablet (75 mcg total) by mouth daily before breakfast.   lisinopril (ZESTRIL) 40 MG tablet Take 1 tablet by mouth daily.   loratadine (CLARITIN) 10 MG tablet Take 1 tablet by mouth daily.   meclizine (ANTIVERT) 25 MG tablet Take 1 tablet (25 mg total) by mouth 3 (three) times daily as needed for dizziness.   metFORMIN (GLUCOPHAGE) 1000 MG tablet TAKE  (1)  TABLET  TWICE A DAY WITH MEALS (BREAKFAST AND SUPPER)   metoprolol succinate (TOPROL-XL) 100 MG 24 hr tablet TAKE ONE TABLET ONCE DAILY   montelukast (SINGULAIR) 10 MG tablet Take 1 tablet (10 mg total) by mouth at bedtime.   neomycin-polymyxin b-dexamethasone (MAXITROL) 3.5-10000-0.1 OINT    rosuvastatin (CRESTOR) 20 MG tablet Take 1 tablet (20 mg total) by mouth at bedtime.   sitaGLIPtin (JANUVIA) 50 MG tablet Take 1 tablet (50 mg total) by mouth daily.    Family History    Family History  Problem Relation Age of Onset   Cancer Mother 40       cervical cancer   Depression Mother    Heart disease Father    Stroke Father    Cancer Father        lung cancer   Heart disease Sister 79       MI   Hypertension Sister    Cancer Sister 42       breast cancer   Healthy Daughter  She indicated that her mother is deceased. She indicated that her father is deceased. She indicated that both of her sisters are alive. She indicated that her daughter is alive.   Social History    Social History   Socioeconomic History   Marital status: Widowed    Spouse name: Not on file   Number of children: 1   Years of education: Not on file   Highest education level: 12th grade  Occupational History   Occupation: Retired from [employer]: retired  Tobacco Use   Smoking status: Never   Smokeless tobacco: Never  Vaping Use   Vaping Use: Never used  Substance and Sexual Activity   Alcohol use: No   Drug use: No   Sexual activity: Not Currently  Other Topics Concern   Not on file  Social History Narrative   Lives home alone - daughter lives about 5 miles away.   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   Social Determinants of Health    Financial Resource Strain: Low Risk  (03/06/2022)   Overall Financial Resource Strain (CARDIA)    Difficulty of Paying Living Expenses: Not hard at all  Food Insecurity: No Food Insecurity (03/06/2022)   Hunger Vital Sign    Worried About Running Out of Food in the Last Year: Never true    Ran Out of Food in the Last Year: Never true  Transportation Needs: No Transportation Needs (03/06/2022)   PRAPARE - Hydrologist (Medical): No    Lack of Transportation (Non-Medical): No  Physical Activity: Insufficiently Active (03/06/2022)   Exercise Vital Sign    Days of Exercise per Week: 7 days    Minutes of Exercise per Session: 10 min  Stress: No Stress Concern Present (03/06/2022)   [place]    Feeling of Stress : Not at all  Social Connections: Moderately Integrated (03/06/2022)   Social Connection and Isolation Panel [NHANES]    Frequency of Communication with Friends and Family: More than three times a week    Frequency of Social Gatherings with Friends and Family: More than three times a week    Attends Religious Services: More than 4 times per year    Active Member of [employer] or Organizations: Yes    Attends Archivist Meetings: More than 4 times per year    Marital Status: Widowed  Intimate Partner Violence: Not At Risk (03/06/2022)   Humiliation, Afraid, Rape, and Kick questionnaire    Fear of Current or Ex-Partner: No    Emotionally Abused: No    Physically Abused: No    Sexually Abused: No     Review of Systems   Limited secondary to patient aphasic.  General:  No chills, fever, night sweats or weight changes. Fatigue.  Cardiovascular:  No chest pain, dyspnea on exertion, edema, orthopnea, palpitations, paroxysmal nocturnal dyspnea. Dermatological: No rash, lesions/masses Respiratory: Positive for productive cough and dyspnea.  Urologic: No hematuria, dysuria Abdominal:    No nausea, vomiting, diarrhea, bright red blood per rectum, melena, or hematemesis Neurologic:  No visual changes, weakness, changes in mental status. All other systems reviewed and are otherwise negative except as noted above.  Physical Exam    VS:  BP (!) 110/40   Pulse 68   Ht _0  (1.626 m)   Wt 158 lb 12.8 oz (72 kg)   SpO2 97%   BMI 27.26 kg/m  , BMI Body mass index is 27.26 kg/m. GEN:  Well nourished, well developed, in no acute distress. HEENT: Normal. Neck: Supple, no JVD, carotid bruits, or masses. Cardiac: RRR, no murmurs, rubs, or gallops. No clubbing, cyanosis.  Radials/DP/PT 2+ and equal bilaterally. Trace edema bilateral lower extremities.  Respiratory:  Respirations regular and unlabored, clear to auscultation bilaterally. GI: Soft, nontender, nondistended. MS: No deformity or atrophy. Skin: Warm and dry, no rash. Neuro: Strength and sensation are intact. Psych: Normal affect.  Accessory Clinical Findings     Recent Labs: 10/05/2021: ALT 18; TSH 1.730 01/23/2022: BUN 13; Creatinine, Ser 0.55; Hemoglobin 10.0; Platelets 243; Potassium 4.0; Sodium 139   Recent Lipid Panel    Component Value Date/Time   CHOL 126 04/24/2021 1612   CHOL 177 02/23/2013 0911   TRIG 294 (H) 04/24/2021 1612   TRIG 203 (H) 01/12/2015 0915   TRIG 293 (H) 02/23/2013 0911   HDL 38 (L) 04/24/2021 1612   HDL 40 01/12/2015 0915   HDL 48 02/23/2013 0911   CHOLHDL 3.3 04/24/2021 1612   LDLCALC 43 04/24/2021 1612   LDLCALC 119 (H) 10/06/2013 1012   LDLCALC 70 02/23/2013 0911         ECG personally reviewed by me today: SR with sinus arrhythmia and occasional PVC, HR 62.  No significant changes from 09/27/2021      Assessment & Plan   Shortness of breath/cough/fatigue. Patient recently diagnosed with bacterial bronchitis (07/04/2022) and prescribed Augmentin and doxycycline. Approximately a week prior to that she was diagnosed with a UTI. Patient's daughter noticed patient with low  energy since that time and was concerned it could be her heart. Patient communicated by shaking her head that she does not have chest pain. She does have shortness of breath that appears increased with coughing. She is still on antibiotic to treat bronchitis. Given lack of chest pain, clear lung sounds, normal EKG, and well-appearing patient will not purse ischemic work-up at this time. Daughter agrees with plan to allow patient to recover from her back to back illness and give her a little more time to regain her energy before pursing further cardiac testing.  Lower extremity edema. Daughter reports occasional lower extremity edema L>R that has been long standing for patient. Trace edema bilaterally today. She does not take a diuretic and does not appear to need it at this time.  Atrial tachycardia. Patient's daughter does not feel this is an issue since starting Metoprolol. Heart rate today is 62. Continue metoprolol.      Disposition: Patient to follow-up in 3 months or sooner as needed.    Justice Britain. Orlene Salmons, NP-C     07/09/2022, 3:05 PM [facility] [address] Office [phone] Fax [phone]   I spent 12 minutes examining this patient, reviewing medications, and using patient centered shared decision making involving her cardiac care.  Prior to her visit I spent greater than 20 minutes reviewing her past medical history,  medications, and prior cardiac tests.

## 2022-07-09 ENCOUNTER — Encounter: Payer: Self-pay | Admitting: Student

## 2022-07-09 ENCOUNTER — Ambulatory Visit: Payer: Medicare PPO | Attending: Physician Assistant | Admitting: Student

## 2022-07-09 VITALS — BP 110/40 | HR 68 | Ht 64.0 in | Wt 158.8 lb

## 2022-07-09 DIAGNOSIS — R600 Localized edema: Secondary | ICD-10-CM | POA: Diagnosis not present

## 2022-07-09 DIAGNOSIS — I4719 Other supraventricular tachycardia: Secondary | ICD-10-CM | POA: Diagnosis not present

## 2022-07-09 DIAGNOSIS — R0600 Dyspnea, unspecified: Secondary | ICD-10-CM

## 2022-07-09 NOTE — Patient Instructions (Signed)
Medication Instructions:  Your physician recommends that you continue on your current medications as directed. Please refer to the Current Medication list given to you today.  *If you need a refill on your cardiac medications before your next appointment, please call your pharmacy*   Lab Work: None ordered  If you have labs (blood work) drawn today and your tests are completely normal, you will receive your results only by: [place] (if you have MyChart) OR A paper copy in the mail If you have any lab test that is abnormal or we need to change your treatment, we will call you to review the results.   Testing/Procedures: None orderec   Follow-Up: At [facility], you and your health needs are our priority.  As part of our continuing mission to provide you with exceptional heart care, we have created designated Provider Care Teams.  These Care Teams include your primary Cardiologist (physician) and Advanced Practice Providers (APPs -  Physician Assistants and Nurse Practitioners) who all work together to provide you with the care you need, when you need it.  We recommend signing up for the patient portal called "MyChart".  Sign up information is provided on this After Visit Summary.  MyChart is used to connect with patients for Virtual Visits (Telemedicine).  Patients are able to view lab/test results, encounter notes, upcoming appointments, etc.  Non-urgent messages can be sent to your provider as well.   To learn more about what you can do with MyChart, go to [email or web address].    Your next appointment:   3 month(s)  The format for your next appointment:   In Person  Provider:   Dr. Gardiner Rhyme      Other Instructions   Important Information About Sugar

## 2022-07-10 ENCOUNTER — Ambulatory Visit (HOSPITAL_COMMUNITY)
Admission: RE | Admit: 2022-07-10 | Discharge: 2022-07-10 | Disposition: A | Discharge status: Home / Self Care | Payer: Medicare PPO | Source: Ambulatory Visit | Attending: Family | Admitting: Family

## 2022-07-10 DIAGNOSIS — R918 Other nonspecific abnormal finding of lung field: Secondary | ICD-10-CM | POA: Diagnosis not present

## 2022-07-10 DIAGNOSIS — R911 Solitary pulmonary nodule: Secondary | ICD-10-CM | POA: Insufficient documentation

## 2022-07-10 DIAGNOSIS — E1142 Type 2 diabetes mellitus with diabetic polyneuropathy: Secondary | ICD-10-CM | POA: Insufficient documentation

## 2022-07-10 DIAGNOSIS — I3139 Other pericardial effusion (noninflammatory): Secondary | ICD-10-CM | POA: Diagnosis not present

## 2022-07-10 DIAGNOSIS — J9811 Atelectasis: Secondary | ICD-10-CM | POA: Diagnosis not present

## 2022-07-10 LAB — POCT I-STAT CREATININE: Creatinine, Ser: 0.8 mg/dL (ref 0.44–1.00)

## 2022-07-10 MED ORDER — IOHEXOL 300 MG/ML  SOLN
75.0000 mL | Freq: Once | INTRAMUSCULAR | Status: AC | PRN
  Administered 2022-07-10: 75 mL via INTRAVENOUS

## 2022-07-10 MED ORDER — SODIUM CHLORIDE (PF) 0.9 % IJ SOLN
INTRAMUSCULAR | Status: AC
  Filled 2022-07-10: qty 50

## 2022-07-16 ENCOUNTER — Other Ambulatory Visit: Payer: Self-pay | Admitting: Family

## 2022-07-16 DIAGNOSIS — E1142 Type 2 diabetes mellitus with diabetic polyneuropathy: Secondary | ICD-10-CM

## 2022-07-22 DIAGNOSIS — J3089 Other allergic rhinitis: Secondary | ICD-10-CM | POA: Diagnosis not present

## 2022-07-22 DIAGNOSIS — J301 Allergic rhinitis due to pollen: Secondary | ICD-10-CM | POA: Diagnosis not present

## 2022-07-22 DIAGNOSIS — J3081 Allergic rhinitis due to animal (cat) (dog) hair and dander: Secondary | ICD-10-CM | POA: Diagnosis not present

## 2022-07-31 DIAGNOSIS — J3081 Allergic rhinitis due to animal (cat) (dog) hair and dander: Secondary | ICD-10-CM | POA: Diagnosis not present

## 2022-07-31 DIAGNOSIS — J301 Allergic rhinitis due to pollen: Secondary | ICD-10-CM | POA: Diagnosis not present

## 2022-07-31 DIAGNOSIS — J3089 Other allergic rhinitis: Secondary | ICD-10-CM | POA: Diagnosis not present

## 2022-08-01 DIAGNOSIS — E1142 Type 2 diabetes mellitus with diabetic polyneuropathy: Secondary | ICD-10-CM | POA: Diagnosis not present

## 2022-08-01 DIAGNOSIS — L84 Corns and callosities: Secondary | ICD-10-CM | POA: Diagnosis not present

## 2022-08-01 DIAGNOSIS — M79676 Pain in unspecified toe(s): Secondary | ICD-10-CM | POA: Diagnosis not present

## 2022-08-01 DIAGNOSIS — B351 Tinea unguium: Secondary | ICD-10-CM | POA: Diagnosis not present

## 2022-08-08 DIAGNOSIS — J3089 Other allergic rhinitis: Secondary | ICD-10-CM | POA: Diagnosis not present

## 2022-08-08 DIAGNOSIS — J301 Allergic rhinitis due to pollen: Secondary | ICD-10-CM | POA: Diagnosis not present

## 2022-08-08 DIAGNOSIS — J3081 Allergic rhinitis due to animal (cat) (dog) hair and dander: Secondary | ICD-10-CM | POA: Diagnosis not present

## 2022-08-15 DIAGNOSIS — J301 Allergic rhinitis due to pollen: Secondary | ICD-10-CM | POA: Diagnosis not present

## 2022-08-15 DIAGNOSIS — J3089 Other allergic rhinitis: Secondary | ICD-10-CM | POA: Diagnosis not present

## 2022-08-15 DIAGNOSIS — J3081 Allergic rhinitis due to animal (cat) (dog) hair and dander: Secondary | ICD-10-CM | POA: Diagnosis not present

## 2022-08-21 DIAGNOSIS — J301 Allergic rhinitis due to pollen: Secondary | ICD-10-CM | POA: Diagnosis not present

## 2022-08-21 DIAGNOSIS — J3081 Allergic rhinitis due to animal (cat) (dog) hair and dander: Secondary | ICD-10-CM | POA: Diagnosis not present

## 2022-08-21 DIAGNOSIS — J3089 Other allergic rhinitis: Secondary | ICD-10-CM | POA: Diagnosis not present

## 2022-08-29 DIAGNOSIS — J301 Allergic rhinitis due to pollen: Secondary | ICD-10-CM | POA: Diagnosis not present

## 2022-08-29 DIAGNOSIS — J3081 Allergic rhinitis due to animal (cat) (dog) hair and dander: Secondary | ICD-10-CM | POA: Diagnosis not present

## 2022-08-29 DIAGNOSIS — J3089 Other allergic rhinitis: Secondary | ICD-10-CM | POA: Diagnosis not present

## 2022-09-03 DIAGNOSIS — J301 Allergic rhinitis due to pollen: Secondary | ICD-10-CM | POA: Diagnosis not present

## 2022-09-03 DIAGNOSIS — J4530 Mild persistent asthma, uncomplicated: Secondary | ICD-10-CM | POA: Diagnosis not present

## 2022-09-03 DIAGNOSIS — R052 Subacute cough: Secondary | ICD-10-CM | POA: Diagnosis not present

## 2022-09-03 DIAGNOSIS — J3089 Other allergic rhinitis: Secondary | ICD-10-CM | POA: Diagnosis not present

## 2022-09-09 ENCOUNTER — Telehealth (INDEPENDENT_AMBULATORY_CARE_PROVIDER_SITE_OTHER): Payer: Medicare PPO | Admitting: Family

## 2022-09-09 ENCOUNTER — Encounter: Payer: Self-pay | Admitting: Family

## 2022-09-09 ENCOUNTER — Telehealth: Payer: Self-pay | Admitting: Family

## 2022-09-09 DIAGNOSIS — J208 Acute bronchitis due to other specified organisms: Secondary | ICD-10-CM

## 2022-09-09 DIAGNOSIS — B9689 Other specified bacterial agents as the cause of diseases classified elsewhere: Secondary | ICD-10-CM

## 2022-09-09 MED ORDER — PREDNISONE 20 MG PO TABS: 40.0000 mg | ORAL_TABLET | Freq: Every day | ORAL | 0 refills | Status: AC

## 2022-09-09 MED ORDER — DOXYCYCLINE HYCLATE 100 MG PO TABS: 100.0000 mg | ORAL_TABLET | Freq: Two times a day (BID) | ORAL | 0 refills | Status: DC

## 2022-09-09 NOTE — Telephone Encounter (Signed)
Daughter wants to know if there is any way Alyse Low can work pt in to be seen today or tomorrow?  Please advise and call [name].

## 2022-09-09 NOTE — Progress Notes (Signed)
Virtual Visit Consent   Kristi Willis, you are scheduled for a virtual visit with a [facility] provider today. Just as with appointments in the office, your consent must be obtained to participate. Your consent will be active for this visit and any virtual visit you may have with one of our providers in the next 365 days. If you have a MyChart account, a copy of this consent can be sent to you electronically.  As this is a virtual visit, video technology does not allow for your provider to perform a traditional examination. This may limit your provider's ability to fully assess your condition. If your provider identifies any concerns that need to be evaluated in person or the need to arrange testing (such as labs, EKG, etc.), we will make arrangements to do so. Although advances in technology are sophisticated, we cannot ensure that it will always work on either your end or our end. If the connection with a video visit is poor, the visit may have to be switched to a telephone visit. With either a video or telephone visit, we are not always able to ensure that we have a secure connection.  By engaging in this virtual visit, you consent to the provision of healthcare and authorize for your insurance to be billed (if applicable) for the services provided during this visit. Depending on your insurance coverage, you may receive a charge related to this service.  I need to obtain your verbal consent now. Are you willing to proceed with your visit today? JAIAH WEIGEL has provided verbal consent on 09/09/2022 for a virtual visit (video or telephone). Kristi Dun, FNP  Date: 09/09/2022 1:56 PM  Virtual Visit via Video Note   I, Kristi Willis, connected with  Kristi Willis  ([id number], April 25, 1941) on 09/09/22 at  3:10 PM EST by a video-enabled telemedicine application and verified that I am speaking with the correct person using two identifiers.  Location: Patient: Virtual Visit Location  Patient: Home Provider: Virtual Visit Location Provider: Home Office   I discussed the limitations of evaluation and management by telemedicine and the availability of in person appointments. The patient expressed understanding and agreed to proceed.    History of Present Illness: Kristi Willis is a 82 y.o. who identifies as a female who was assigned female at birth, and is being seen today for wheezing and coughing that started over a week ago. Saw her allergen specialists last week and got a steroid shot that helps slightly, but now feeling worse.   HPI: Wheezing  This is a recurrent problem. The current episode started 1 to 4 weeks ago. The problem occurs intermittently. The problem has been waxing and waning. Associated symptoms include coughing and shortness of breath. Pertinent negatives include no chills, fever, headaches or sputum production. She has tried rest for the symptoms. The treatment provided mild relief. Her past medical history is significant for asthma.    Problems:  Patient Active Problem List   Diagnosis Date Noted   Allergic rhinitis 03/05/2022   Pulmonary congestion 02/11/2021   Aortic atherosclerosis ([place]) 02/11/2021   Hypo-osmolar hyponatremia 02/10/2021   Primary progressive aphasia ([place]) 03/23/2020   Pain in left shoulder 03/50/0938   Acute metabolic encephalopathy 18/29/9371   Iron deficiency anemia 08/05/2019   QT prolongation 08/05/2019   Hypothyroidism 02/12/2019   Speech articulation disorder 09/01/2018   Essential hypertension 10/03/2017   GAD (generalized anxiety disorder) 07/03/2017   Diabetes mellitus ([place]) 09/27/2016   Depression 03/29/2016  Insomnia 07/18/2015   Vitamin D deficiency 10/11/2013   Right carotid bruit 03/02/2013   Degenerative disc disease, lumbar 11/27/2012   Asthma 11/13/2010   Hyperlipidemia associated with type 2 diabetes mellitus ([place]) 11/13/2010   Cancer of skin 11/13/2010   Diabetic neuropathy ([place]) 11/13/2010    Wide-complex tachycardia==Atrial Tachycardia 11/13/2010    Allergies:  Allergies  Allergen Reactions   Dimetapp C [Phenylephrine-Bromphen-Codeine] Itching   Phenylephrine Hcl Itching   Shingrix [Zoster Vac Recomb Adjuvanted] Other (See Comments)    Swelling and fever    Ultram [Tramadol Hcl] Other (See Comments)    Insomnia   Celebrex [Celecoxib] Rash   Cortisone Rash   Fenofibrate Rash   Sulfa Antibiotics Rash   Medications:  Current Outpatient Medications:    doxycycline (VIBRA-TABS) 100 MG tablet, Take 1 tablet (100 mg total) by mouth 2 (two) times daily., Disp: 20 tablet, Rfl: 0   predniSONE (DELTASONE) 20 MG tablet, Take 2 tablets (40 mg total) by mouth daily with breakfast for 5 days., Disp: 10 tablet, Rfl: 0   Accu-Chek Softclix Lancets lancets, CHECK BLOOD SUGAR ONCE DAILY OR AS DIRECTED Dx E11.40, Disp: 100 each, Rfl: 3   acetaminophen (TYLENOL) 325 MG tablet, Take 2 tablets (650 mg total) by mouth every 6 (six) hours as needed for mild pain, fever or moderate pain., Disp: 60 tablet, Rfl: 0   acyclovir ointment (ZOVIRAX) 5 %, APPLY TO THE AFFECTED AREA AROUND MOUTH EVERY 3 HOURS AS DIRECTED UNTIL CLEAR, Disp: 15 g, Rfl: 0   albuterol (PROVENTIL) (2.5 MG/3ML) 0.083% nebulizer solution, Take 3 mLs (2.5 mg total) by nebulization every 6 (six) hours as needed for wheezing or shortness of breath., Disp: 150 mL, Rfl: 1   albuterol (VENTOLIN HFA) 108 (90 Base) MCG/ACT inhaler, Inhale 2 puffs into the lungs every 6 (six) hours as needed for wheezing or shortness of breath., Disp: 1 each, Rfl: 3   amLODipine (NORVASC) 5 MG tablet, Take 1 tablet (5 mg total) by mouth daily., Disp: 90 tablet, Rfl: 1   aspirin 81 MG chewable tablet, Chew 81 mg by mouth at bedtime., Disp: , Rfl:    azelastine (ASTELIN) 0.1 % nasal spray, Place 2 sprays into both nostrils 2 (two) times daily., Disp: 30 mL, Rfl: 11   Blood Glucose Monitoring Suppl (ACCU-CHEK AVIVA PLUS) w/Device KIT, Check sugars daily & as  needed Dx E11.9, Disp: 1 kit, Rfl: 0   cetirizine (ZYRTEC) 10 MG tablet, TAKE ONE TABLET ONCE DAILY, Disp: 90 tablet, Rfl: 1   Coenzyme Q10 (COQ10 PO), Take 1 tablet by mouth daily., Disp: , Rfl:    doxazosin (CARDURA) 8 MG tablet, TAKE (1) TABLET DAILY FOR HIGH BLOOD PRESSURE., Disp: 90 tablet, Rfl: 2   EPIPEN 2-PAK 0.3 MG/0.3ML SOAJ injection, Inject 0.3 mg into the muscle as needed for anaphylaxis. Reported on 01/24/2016, Disp: , Rfl:    escitalopram (LEXAPRO) 20 MG tablet, Take 1 tablet (20 mg total) by mouth daily. (NEEDS TO BE SEEN BEFORE NEXT REFILL), Disp: 90 tablet, Rfl: 3   glucose blood (ACCU-CHEK AVIVA PLUS) test strip, CHECK BLOOD SUGAR ONCE DAILY OR AS DIRECTED Dx E11.40, Disp: 100 strip, Rfl: 3   ipratropium-albuterol (DUONEB) 0.5-2.5 (3) MG/3ML SOLN, Take 3 mLs by nebulization 2 (two) times daily., Disp: 125 mL, Rfl: 0   ketoconazole (NIZORAL) 2 % cream, Apply 1 application topically daily., Disp: 30 g, Rfl: 0   levothyroxine (SYNTHROID) 75 MCG tablet, Take 1 tablet (75 mcg total) by mouth daily before  breakfast., Disp: 90 tablet, Rfl: 3   lisinopril (ZESTRIL) 40 MG tablet, Take 1 tablet by mouth daily., Disp: , Rfl:    loratadine (CLARITIN) 10 MG tablet, Take 1 tablet by mouth daily., Disp: , Rfl:    meclizine (ANTIVERT) 25 MG tablet, Take 1 tablet (25 mg total) by mouth 3 (three) times daily as needed for dizziness., Disp: 30 tablet, Rfl: 0   metFORMIN (GLUCOPHAGE) 1000 MG tablet, TAKE  (1)  TABLET TWICE A DAY WITH MEALS (BREAKFAST AND SUPPER), Disp: 180 tablet, Rfl: 3   metoprolol succinate (TOPROL-XL) 100 MG 24 hr tablet, TAKE ONE TABLET ONCE DAILY, Disp: 90 tablet, Rfl: 1   montelukast (SINGULAIR) 10 MG tablet, Take 1 tablet (10 mg total) by mouth at bedtime., Disp: 30 tablet, Rfl: 5   neomycin-polymyxin b-dexamethasone (MAXITROL) 3.5-10000-0.1 OINT, , Disp: , Rfl:    rosuvastatin (CRESTOR) 20 MG tablet, Take 1 tablet (20 mg total) by mouth at bedtime., Disp: 90 tablet, Rfl: 3    sitaGLIPtin (JANUVIA) 50 MG tablet, TAKE 1 TABLET DAILY, Disp: 90 tablet, Rfl: 0  Observations/Objective: Patient is well-developed, well-nourished in no acute distress.  Resting comfortably  at home.  Head is normocephalic, atraumatic.  No labored breathing.  Speech is clear and coherent with logical content.  Patient is alert and oriented at baseline.  Wheezing and nonproductive cough  Assessment and Plan: 1. Acute bacterial bronchitis - doxycycline (VIBRA-TABS) 100 MG tablet; Take 1 tablet (100 mg total) by mouth 2 (two) times daily.  Dispense: 20 tablet; Refill: 0 - predniSONE (DELTASONE) 20 MG tablet; Take 2 tablets (40 mg total) by mouth daily with breakfast for 5 days.  Dispense: 10 tablet; Refill: 0  - Take meds as prescribed - Use a cool mist humidifier  -Use saline nose sprays frequently -Force fluids -For any cough or congestion  Use plain Mucinex- regular strength or max strength is fine -For fever or aces or pains- take tylenol or ibuprofen. -Throat lozenges if help -Follow up if symptoms worsen or do not improve   Follow Up Instructions: I discussed the assessment and treatment plan with the patient. The patient was provided an opportunity to ask questions and all were answered. The patient agreed with the plan and demonstrated an understanding of the instructions.  A copy of instructions were sent to the patient via MyChart unless otherwise noted below.    The patient was advised to call back or seek an in-person evaluation if the symptoms worsen or if the condition fails to improve as anticipated.  Time:  I spent 8 minutes with the patient via telehealth technology discussing the above problems/concerns.    Kristi Dun, FNP

## 2022-09-09 NOTE — Telephone Encounter (Signed)
Appt made

## 2022-09-11 DIAGNOSIS — J301 Allergic rhinitis due to pollen: Secondary | ICD-10-CM | POA: Diagnosis not present

## 2022-09-11 DIAGNOSIS — J3081 Allergic rhinitis due to animal (cat) (dog) hair and dander: Secondary | ICD-10-CM | POA: Diagnosis not present

## 2022-09-11 DIAGNOSIS — J3089 Other allergic rhinitis: Secondary | ICD-10-CM | POA: Diagnosis not present

## 2022-09-23 ENCOUNTER — Other Ambulatory Visit: Payer: Self-pay | Admitting: Family

## 2022-09-23 DIAGNOSIS — I10 Essential (primary) hypertension: Secondary | ICD-10-CM

## 2022-09-23 NOTE — Progress Notes (Deleted)
Cardiology Office Note:    Date:  01/24/2021   ID:  Kristi Willis, DOB January 17, 1941, MRN [id number]  PCP:  Sharion Balloon, FNP  Cardiologist:  None  Electrophysiologist:  None   Referring MD: Sharion Balloon, FNP   Chief Complaint  Patient presents with   Edema    History of Present Illness:    Kristi Willis is a 82 y.o. female with a hx of type 2 diabetes, hypertension, hyperlipidemia, atrial tachycardia, aphasia, asthma who presents for follow-up.  She was referred by Kristi Dun, FNP for evaluation of chest pain, initially seen on 07/28/2019.  History is partially provided by her daughter given patient's aphasia.  On 12/1, daughter reports that patient was walking outside and came inside reporting shortness of breath and heaviness in her chest.  She went to the ED. Unfortunately had long wait in the ED so left without being seen.  Troponins were drawn, which were negative x2.  Reports that she has had chest heaviness intermittently since that time.  Lasts a few minutes and resolves.  Also has been having lower extremity edema recently.  Never smoker.  Sister had MI around age 68.  Father had MI in 69s.  Previously followed with Dr. Caryl Comes for atrial tachycardia.  Would feel like heart is racing.  Reports has been well controlled on Toprol-XL 100 mg twice daily.     TTE was done on 07/29/2019, which shows EF 60 to 65%, normal RV function, severe left atrial dilatation, no significant valvular disease.  Lower extremity duplex was done to evaluate lower extremity edema, which showed no evidence of DVT.  Lexiscan Myoview on 11/11/2019 showed small fixed mid anteroseptal/apical anterior/apex perfusion defect, likely representing artifact given normal wall motion in that area.   Since last clinic visit,  denies any chest pain.  Reports intermittent dyspnea.  Also with intermittent lightheadedness but denies any syncope.  No palpitations.  Reports occasional lower extremity  edema.    Past Medical History:  Diagnosis Date   Allergy    Asthma    Cancer ([place])    skin   Diabetes mellitus    GERD (gastroesophageal reflux disease)    hiatal hernia   Hypercholesteremia    Hypertension    Neuromuscular disorder (H[facility])    DM neuropathy   Neuropathy    SVT (supraventricular tachycardia) (H[facility])     Past Surgical History:  Procedure Laterality Date   BREAST BIOPSY     left breast biopsy   LUMBAR LAMINECTOMY  08/10   SKIN CANCER EXCISION  2005   SPINE SURGERY     lumbar laminectomy    Current Medications: Current Meds  Medication Sig   Accu-Chek Softclix Lancets lancets CHECK BLOOD SUGAR ONCE DAILY OR AS DIRECTED Dx E11.40   acetaminophen (TYLENOL) 325 MG tablet Take 2 tablets (650 mg total) by mouth every 6 (six) hours as needed for mild pain or fever (or Fever >/= 101).   acyclovir ointment (ZOVIRAX) 5 % APPLY TO THE AFFECTED AREA AROUND MOUTH EVERY 3 HOURS AS DIRECTED UNTIL CLEAR (Patient taking differently: Apply 1 application topically every 3 (three) hours. Using as needed for cold sores)   albuterol (VENTOLIN HFA) 108 (90 BASE) MCG/ACT inhaler Inhale 2 puffs into the lungs every 6 (six) hours as needed for wheezing.   amLODipine (NORVASC) 5 MG tablet Take 1 tablet (5 mg total) by mouth daily.   aspirin 81 MG chewable tablet Chew 81 mg by mouth daily.  azelastine (ASTELIN) 0.1 % nasal spray Place 2 sprays into both nostrils 2 (two) times daily.   Blood Glucose Monitoring Suppl (ACCU-CHEK AVIVA PLUS) w/Device KIT Check sugars daily & as needed Dx E11.9   Calcium Carbonate-Vitamin D 600-400 MG-UNIT tablet Take 1 tablet by mouth 2 (two) times daily.   doxazosin (CARDURA) 8 MG tablet Take 1 tablet (8 mg total) by mouth daily. (Needs to be seen before next refill)   EPIPEN 2-PAK 0.3 MG/0.3ML SOAJ injection Inject 0.3 mg into the muscle as needed for anaphylaxis. Reported on 01/24/2016   escitalopram (LEXAPRO) 20 MG tablet TAKE 1 TABLET DAILY   gabapentin  (NEURONTIN) 100 MG capsule Take 1 capsule (100 mg total) 3 (three) times daily by mouth.   glucose blood (ACCU-CHEK AVIVA PLUS) test strip CHECK BLOOD SUGAR ONCE DAILY OR AS DIRECTED Dx E11.40   ipratropium-albuterol (DUONEB) 0.5-2.5 (3) MG/3ML SOLN Take 3 mLs by nebulization 2 (two) times daily.   ketoconazole (NIZORAL) 2 % cream APPLY TO AFFECTED AREASFON BOTTOMS OF FEET TWICE A DAY   levothyroxine (SYNTHROID) 75 MCG tablet Take 1 tablet (75 mcg total) by mouth daily before breakfast.   lisinopril (ZESTRIL) 40 MG tablet TAKE 1 TABLET DAILY   loratadine (CLARITIN) 10 MG tablet Take 1 tablet (10 mg total) by mouth daily.   metFORMIN (GLUCOPHAGE) 1000 MG tablet TAKE  (1)  TABLET TWICE A DAY WITH MEALS (BREAKFAST AND SUPPER)   metoprolol succinate (TOPROL-XL) 100 MG 24 hr tablet Take 1 tablet (100 mg total) by mouth daily.   montelukast (SINGULAIR) 10 MG tablet Take 1 tablet (10 mg total) by mouth at bedtime.   Multiple Vitamin (MULTIVITAMIN) tablet Take 1 tablet by mouth daily.   naproxen (NAPROSYN) 500 MG tablet Take 1 tablet (500 mg total) by mouth 2 (two) times daily with a meal.   rosuvastatin (CRESTOR) 20 MG tablet Take 1 tablet (20 mg total) by mouth at bedtime.   vitamin B-12 (CYANOCOBALAMIN) 500 MCG tablet Take 500 mcg by mouth daily.   zinc gluconate 50 MG tablet Take 50 mg by mouth daily.     Allergies:   Dimetapp c [phenylephrine-bromphen-codeine], Phenylephrine hcl, Ultram [tramadol hcl], Celebrex [celecoxib], Cortisone, Fenofibrate, and Sulfa antibiotics   Social History   Socioeconomic History   Marital status: Widowed    Spouse name: Not on file   Number of children: 1   Years of education: Not on file   Highest education level: 12th grade  Occupational History   Occupation: Retired from [employer] but still works part-time  Tobacco Use   Smoking status: Never Smoker   Smokeless tobacco: Never Used  Scientific laboratory technician Use: Never used  Substance and Sexual Activity    Alcohol use: No   Drug use: No   Sexual activity: Never  Other Topics Concern   Not on file  Social History Narrative   Not on file   Social Determinants of Health   Financial Resource Strain: Not on file  Food Insecurity: Not on file  Transportation Needs: Not on file  Physical Activity: Not on file  Stress: Not on file  Social Connections: Not on file     Family History: The patient's family history includes Cancer in her father; Cancer (age of onset: 3) in her mother; Cancer (age of onset: 29) in her sister; Depression in her mother; Healthy in her daughter; Heart disease in her father; Heart disease (age of onset: 35) in her sister; Hypertension in her sister;  Stroke in her father.  ROS:   Please see the history of present illness.     All other systems reviewed and are negative.  EKGs/Labs/Other Studies Reviewed:    The following studies were reviewed today:  EKG:  EKG is ordered today.  The ekg ordered today demonstrates normal sinus rhythm, rate 64,nonspecific T wave flattening, poor R wave progression  Recent Labs: 10/20/2020: ALT 21; BUN 15; Creatinine, Ser 0.58; Hemoglobin 10.6; Platelets 212; Potassium 4.3; Sodium 139; TSH 1.140  Recent Lipid Panel    Component Value Date/Time   CHOL 115 05/02/2020 0810   CHOL 177 02/23/2013 0911   TRIG 174 (H) 05/02/2020 0810   TRIG 203 (H) 01/12/2015 0915   TRIG 293 (H) 02/23/2013 0911   HDL 40 05/02/2020 0810   HDL 40 01/12/2015 0915   HDL 48 02/23/2013 0911   CHOLHDL 2.9 05/02/2020 0810   LDLCALC 46 05/02/2020 0810   LDLCALC 119 (H) 10/06/2013 1012   LDLCALC 70 02/23/2013 0911    Physical Exam:    VS:  BP 132/74   Pulse 64   Ht '5\' 3"'$  (1.6 m)   Wt 164 lb (74.4 kg)   BMI 29.05 kg/m     Wt Readings from Last 3 Encounters:  01/24/21 164 lb (74.4 kg)  10/20/20 232 lb (105.2 kg)  03/23/20 164 lb 9.6 oz (74.7 kg)     GEN:in no acute distress HEENT: Normal NECK: No JVD; No carotid bruits CARDIAC: RRR, no  murmurs, rubs, gallops RESPIRATORY:  Clear to auscultation without rales, wheezing or rhonchi  ABDOMEN: Soft, non-tender, non-distended MUSCULOSKELETAL:  No LE edema SKIN: Warm and dry NEUROLOGIC:  Alert and oriented x 3 PSYCHIATRIC:  Normal affect   ASSESSMENT:    1. Essential hypertension   2. Chest pain, unspecified type   3. Lower extremity edema   4. Hyperlipidemia, unspecified hyperlipidemia type    PLAN:    Chest pain: typical in description, as describes chest heaviness with exertion.  However, it is difficult to get a full history given patient's aphasia.    Lexiscan Myoview 10/2019 shows no evidence of ischemia.  Reports no recent chest pain, suspect may have been related to episode of pneumonia.  No further cardiac work-up recommended   Lower extremity edema: TTE on 07/29/2019 showed no evidence of structural heart disease.  No DVT on lower extremity duplex.  BNP 118.  Could be secondary to amlodipine use.  Prescribed as needed Lasix, reports has not been taking.  No edema on exam today   Hypertension: On amlodipine 5 mg daily, doxazosin 8 mg daily, lisinopril 40 mg daily, Toprol-XL 100 mg twice daily.    Appears controlled   Hyperlipidemia: On rosuvastatin 20 mg daily.  LDL 46 on 05/02/20   Type 2 diabetes: On Januvia, Metformin.  A1c 7.0   RTC in 1 year  Medication Adjustments/Labs and Tests Ordered: Current medicines are reviewed at length with the patient today.  Concerns regarding medicines are outlined above.  Orders Placed This Encounter  Procedures   EKG 12-Lead   No orders of the defined types were placed in this encounter.   Patient Instructions  Medication Instructions:  Your physician recommends that you continue on your current medications as directed. Please refer to the Current Medication list given to you today.  *If you need a refill on your cardiac medications before your next appointment, please call your pharmacy*   Follow-Up: At [facility], you and your health needs are our  priority.  As part of our continuing mission to provide you with exceptional heart care, we have created designated Provider Care Teams.  These Care Teams include your primary Cardiologist (physician) and Advanced Practice Providers (APPs -  Physician Assistants and Nurse Practitioners) who all work together to provide you with the care you need, when you need it.  We recommend signing up for the patient portal called "MyChart".  Sign up information is provided on this After Visit Summary.  MyChart is used to connect with patients for Virtual Visits (Telemedicine).  Patients are able to view lab/test results, encounter notes, upcoming appointments, etc.  Non-urgent messages can be sent to your provider as well.   To learn more about what you can do with MyChart, go to [email or web address].    Your next appointment:   12 month(s)  The format for your next appointment:   In Person  Provider:   Oswaldo Milian, MD      Signed, Donato Heinz, MD  01/24/2021 5:32 PM    [place]

## 2022-09-24 ENCOUNTER — Ambulatory Visit: Payer: Medicare PPO | Admitting: Cardiology

## 2022-09-25 DIAGNOSIS — J3081 Allergic rhinitis due to animal (cat) (dog) hair and dander: Secondary | ICD-10-CM | POA: Diagnosis not present

## 2022-09-25 DIAGNOSIS — J3089 Other allergic rhinitis: Secondary | ICD-10-CM | POA: Diagnosis not present

## 2022-09-25 DIAGNOSIS — J301 Allergic rhinitis due to pollen: Secondary | ICD-10-CM | POA: Diagnosis not present

## 2022-10-02 ENCOUNTER — Encounter: Payer: Self-pay | Admitting: Nurse Practitioner

## 2022-10-02 ENCOUNTER — Ambulatory Visit: Payer: Medicare PPO | Attending: Nurse Practitioner | Admitting: Nurse Practitioner

## 2022-10-02 VITALS — BP 120/50 | HR 68 | Ht 64.0 in | Wt 159.0 lb

## 2022-10-02 DIAGNOSIS — E782 Mixed hyperlipidemia: Secondary | ICD-10-CM

## 2022-10-02 DIAGNOSIS — I10 Essential (primary) hypertension: Secondary | ICD-10-CM

## 2022-10-02 DIAGNOSIS — I4719 Other supraventricular tachycardia: Secondary | ICD-10-CM | POA: Diagnosis not present

## 2022-10-02 DIAGNOSIS — R600 Localized edema: Secondary | ICD-10-CM | POA: Diagnosis not present

## 2022-10-02 DIAGNOSIS — R0602 Shortness of breath: Secondary | ICD-10-CM | POA: Diagnosis not present

## 2022-10-02 DIAGNOSIS — E119 Type 2 diabetes mellitus without complications: Secondary | ICD-10-CM | POA: Diagnosis not present

## 2022-10-02 DIAGNOSIS — R079 Chest pain, unspecified: Secondary | ICD-10-CM

## 2022-10-02 DIAGNOSIS — J3081 Allergic rhinitis due to animal (cat) (dog) hair and dander: Secondary | ICD-10-CM | POA: Diagnosis not present

## 2022-10-02 DIAGNOSIS — J3089 Other allergic rhinitis: Secondary | ICD-10-CM | POA: Diagnosis not present

## 2022-10-02 DIAGNOSIS — J301 Allergic rhinitis due to pollen: Secondary | ICD-10-CM | POA: Diagnosis not present

## 2022-10-02 NOTE — Progress Notes (Signed)
Office Visit    Patient Name: Kristi Willis Date of Encounter: 10/02/2022  Primary Care Provider:  Sharion Balloon, FNP Primary Cardiologist:  Donato Heinz, MD  Chief Complaint    82 year old female with a history of atrial tachycardia, chest pain, bilateral lower extremity edema, hypertension, hyperlipidemia, type 2 diabetes, and aphasia who presents for follow-up related to shortness of breath.  Past Medical History    Past Medical History:  Diagnosis Date   Allergy    Asthma    Cancer ([place])    skin   Diabetes mellitus    GERD (gastroesophageal reflux disease)    hiatal hernia   Hypercholesteremia    Hypertension    Neuromuscular disorder (HCC)    DM neuropathy   Neuropathy    SVT (supraventricular tachycardia)    Past Surgical History:  Procedure Laterality Date   BREAST BIOPSY     left breast biopsy   LUMBAR LAMINECTOMY  08/10   SKIN CANCER EXCISION  2005   SPINE SURGERY     lumbar laminectomy    Allergies  Allergies  Allergen Reactions   Dimetapp C [Phenylephrine-Bromphen-Codeine] Itching   Phenylephrine Hcl Itching   Shingrix [Zoster Vac Recomb Adjuvanted] Other (See Comments)    Swelling and fever    Ultram [Tramadol Hcl] Other (See Comments)    Insomnia   Celebrex [Celecoxib] Rash   Cortisone Rash   Fenofibrate Rash   Sulfa Antibiotics Rash     Labs/Other Studies Reviewed    The following studies were reviewed today: Nuclear study 10/2019: <56m ST depressions with stress Nuclear stress EF: 60%. Defect 1: There is a fixed small defect of mild severity present in the mid anteroseptal, apical anterior, and apex location. This is a low risk study. The study is normal.   Fixed small defect of mild severity present in the mid anteroseptal, apical anterior, and apex location.  Given normal wall motion in this area, suspect artifact.  Echo 01/2021: IMPRESSIONS   1. Left ventricular ejection fraction, by estimation, is 60 to 65%.  The  left ventricle has normal function. The left ventricle has no regional  wall motion abnormalities. There is mild left ventricular hypertrophy.  Left ventricular diastolic parameters  are indeterminate.   2. Right ventricular systolic function is normal. The right ventricular  size is normal. Tricuspid regurgitation signal is inadequate for assessing  PA pressure.   3. Left atrial size was moderately dilated.   4. Right atrial size was mildly dilated.   5. The mitral valve is degenerative. Trivial mitral valve regurgitation.  Moderate mitral annular calcification.   6. The aortic valve is tricuspid. Aortic valve regurgitation is not  visualized. No aortic stenosis is present.   7. The inferior vena cava is dilated in size with <50% respiratory  variability, suggesting right atrial pressure of 15 mmHg.    Recent Labs: 10/05/2021: ALT 18; TSH 1.730 01/23/2022: BUN 13; Hemoglobin 10.0; Platelets 243; Potassium 4.0; Sodium 139 07/10/2022: Creatinine, Ser 0.80  Recent Lipid Panel    Component Value Date/Time   CHOL 126 04/24/2021 1612   CHOL 177 02/23/2013 0911   TRIG 294 (H) 04/24/2021 1612   TRIG 203 (H) 01/12/2015 0915   TRIG 293 (H) 02/23/2013 0911   HDL 38 (L) 04/24/2021 1612   HDL 40 01/12/2015 0915   HDL 48 02/23/2013 0911   CHOLHDL 3.3 04/24/2021 1612   LDLCALC 43 04/24/2021 1612   LDLCALC 119 (H) 10/06/2013 1012   LDLCALC 70  02/23/2013 0911    History of Present Illness    82 year old female with the above past medical history including atrial tachycardia, chest pain, bilateral lower extremity edema, hypertension, hyperlipidemia, type 2 diabetes, and aphasia.   Nuclear study in 2021 was low risk.  Most recent echocardiogram in 01/2021 showed EF 60 to 65%, mild LVH, moderately dilated left atrium, mildly dilated right atrium, trivial mitral valve regurgitation, moderate MAC.  She has a history of chronic bilateral lower extremity edema, controlled with as needed Lasix.   She was last seen in the office on 07/09/2022 and noted shortness of breath, decreased activity tolerance.  She was diagnosed with a UTI/bacterial bronchitis prior to her visit.  She denied chest pain.  Ischemic evaluation was deferred given acute bronchitis.  She presents for follow-up accompanied by her daughter.  Since her last visit she has done well from a cardiac standpoint.  She notes 2 very brief episodes of chest tightness that occurred while she was lying in bed at night. She denies any exertional symptoms concerning for angina. She has stable intermittent nonpitting bilateral lower extremity edema. She denies dyspnea, pnd, orthopnea, weight gain. She notes her energy levels are back to normal. She denies any palpitations, dizziness, presyncope, syncope.  Overall, she reports feeling well.    Home Medications    Current Outpatient Medications  Medication Sig Dispense Refill   Accu-Chek Softclix Lancets lancets CHECK BLOOD SUGAR ONCE DAILY OR AS DIRECTED Dx E11.40 100 each 3   acetaminophen (TYLENOL) 325 MG tablet Take 2 tablets (650 mg total) by mouth every 6 (six) hours as needed for mild pain, fever or moderate pain. 60 tablet 0   acyclovir ointment (ZOVIRAX) 5 % APPLY TO THE AFFECTED AREA AROUND MOUTH EVERY 3 HOURS AS DIRECTED UNTIL CLEAR 15 g 0   albuterol (PROVENTIL) (2.5 MG/3ML) 0.083% nebulizer solution Take 3 mLs (2.5 mg total) by nebulization every 6 (six) hours as needed for wheezing or shortness of breath. 150 mL 1   albuterol (VENTOLIN HFA) 108 (90 Base) MCG/ACT inhaler Inhale 2 puffs into the lungs every 6 (six) hours as needed for wheezing or shortness of breath. 1 each 3   amLODipine (NORVASC) 5 MG tablet Take 1 tablet (5 mg total) by mouth daily. (NEEDS TO BE SEEN BEFORE NEXT REFILL) 30 tablet 0   aspirin 81 MG chewable tablet Chew 81 mg by mouth at bedtime.     azelastine (ASTELIN) 0.1 % nasal spray Place 2 sprays into both nostrils 2 (two) times daily. 30 mL 11   Blood  Glucose Monitoring Suppl (ACCU-CHEK AVIVA PLUS) w/Device KIT Check sugars daily & as needed Dx E11.9 1 kit 0   cetirizine (ZYRTEC) 10 MG tablet TAKE ONE TABLET ONCE DAILY 90 tablet 1   Coenzyme Q10 (COQ10 PO) Take 1 tablet by mouth daily.     doxazosin (CARDURA) 8 MG tablet TAKE (1) TABLET DAILY FOR HIGH BLOOD PRESSURE. 90 tablet 2   doxycycline (VIBRA-TABS) 100 MG tablet Take 1 tablet (100 mg total) by mouth 2 (two) times daily. 20 tablet 0   EPIPEN 2-PAK 0.3 MG/0.3ML SOAJ injection Inject 0.3 mg into the muscle as needed for anaphylaxis. Reported on 01/24/2016     escitalopram (LEXAPRO) 20 MG tablet Take 1 tablet (20 mg total) by mouth daily. (NEEDS TO BE SEEN BEFORE NEXT REFILL) 90 tablet 3   glucose blood (ACCU-CHEK AVIVA PLUS) test strip CHECK BLOOD SUGAR ONCE DAILY OR AS DIRECTED Dx E11.40 100 strip 3  ipratropium-albuterol (DUONEB) 0.5-2.5 (3) MG/3ML SOLN Take 3 mLs by nebulization 2 (two) times daily. 125 mL 0   ketoconazole (NIZORAL) 2 % cream Apply 1 application topically daily. 30 g 0   levothyroxine (SYNTHROID) 75 MCG tablet Take 1 tablet (75 mcg total) by mouth daily before breakfast. 90 tablet 3   lisinopril (ZESTRIL) 40 MG tablet Take 1 tablet by mouth daily.     loratadine (CLARITIN) 10 MG tablet Take 1 tablet by mouth daily.     meclizine (ANTIVERT) 25 MG tablet Take 1 tablet (25 mg total) by mouth 3 (three) times daily as needed for dizziness. 30 tablet 0   metFORMIN (GLUCOPHAGE) 1000 MG tablet TAKE  (1)  TABLET TWICE A DAY WITH MEALS (BREAKFAST AND SUPPER) 180 tablet 3   metoprolol succinate (TOPROL-XL) 100 MG 24 hr tablet TAKE ONE TABLET ONCE DAILY 90 tablet 1   montelukast (SINGULAIR) 10 MG tablet Take 1 tablet (10 mg total) by mouth at bedtime. 30 tablet 5   neomycin-polymyxin b-dexamethasone (MAXITROL) 3.5-10000-0.1 OINT      rosuvastatin (CRESTOR) 20 MG tablet Take 1 tablet (20 mg total) by mouth at bedtime. 90 tablet 3   sitaGLIPtin (JANUVIA) 50 MG tablet TAKE 1 TABLET  DAILY 90 tablet 0   No current facility-administered medications for this visit.     Review of Systems    She denies chest pain, palpitations, dyspnea, pnd, orthopnea, n, v, dizziness, syncope, edema, weight gain, or early satiety. All other systems reviewed and are otherwise negative except as noted above.  Physical Exam    VS:  BP (!) 120/50 (BP Location: Left Arm, Patient Position: Sitting, Cuff Size: Normal)   Pulse 68   Ht 5' 4"$  (1.626 m)   Wt 159 lb (72.1 kg)   BMI 27.29 kg/m  GEN: Well nourished, well developed, in no acute distress. HEENT: normal. Neck: Supple, no JVD, carotid bruits, or masses. Cardiac: RRR, no murmurs, rubs, or gallops. No clubbing, cyanosis, edema.  Radials/DP/PT 2+ and equal bilaterally.  Respiratory:  Respirations regular and unlabored, clear to auscultation bilaterally. GI: Soft, nontender, nondistended, BS + x 4. MS: no deformity or atrophy. Skin: warm and dry, no rash. Neuro:  Strength and sensation are intact. Psych: Normal affect.  Accessory Clinical Findings    ECG personally reviewed by me today -NSR, 62 bpm, nonspecific ST/T wave abnormality- no acute changes.   Lab Results  Component Value Date   WBC 8.8 01/23/2022   HGB 10.0 (L) 01/23/2022   HCT 31.3 (L) 01/23/2022   MCV 85 01/23/2022   PLT 243 01/23/2022   Lab Results  Component Value Date   CREATININE 0.80 07/10/2022   BUN 13 01/23/2022   NA 139 01/23/2022   K 4.0 01/23/2022   CL 100 01/23/2022   CO2 22 01/23/2022   Lab Results  Component Value Date   ALT 18 10/05/2021   AST 26 10/05/2021   ALKPHOS 46 10/05/2021   BILITOT 0.4 10/05/2021   Lab Results  Component Value Date   CHOL 126 04/24/2021   HDL 38 (L) 04/24/2021   LDLCALC 43 04/24/2021   TRIG 294 (H) 04/24/2021   CHOLHDL 3.3 04/24/2021    Lab Results  Component Value Date   HGBA1C 6.7 (H) 10/05/2021    Assessment & Plan   1. Shortness of breath/chest tightness: Shortness of breath has resolved.   Since her last visit she has had 2 very brief episodes of chest tightness that occurred while she was lying  in bed at night. She denies any exertional symptoms.  Euvolemic and well compensated on exam.  Her energy levels have returned to baseline.  I do not think further ischemic evaluation is warranted at this time. I advised her to continue to monitor symptoms and notify us if she has more frequent symptoms of chest tightness, exertional symptoms, or progressive shortness of breath.  2. Atrial tachycardia: She denies any recent palpitations.  Continue metoprolol.  3. Chronic bilateral lower extremity edema: She has stable intermittent nonpitting bilateral lower extremity edema.  Euvolemic and well compensated on exam. No indication for loop diuretic at this time.  4. Hypertension: BP well controlled. Continue current antihypertensive regimen.   5. Hyperlipidemia: LDL was 43 in 04/2021.  Monitored and managed per PCP.  Continue Crestor.  6. Type 2 diabetes: A1c was 6.7 in 09/2021.  Monitored and managed per PCP.  7. Disposition: Follow-up in 6 months, sooner if needed.     Lenna Sciara, NP 10/02/2022, 5:20 PM

## 2022-10-02 NOTE — Patient Instructions (Signed)
Medication Instructions:  Your physician recommends that you continue on your current medications as directed. Please refer to the Current Medication list given to you today.   *If you need a refill on your cardiac medications before your next appointment, please call your pharmacy*   Lab Work: NONE ordered at this time of appointment   If you have labs (blood work) drawn today and your tests are completely normal, you will receive your results only by: [place] (if you have MyChart) OR A paper copy in the mail If you have any lab test that is abnormal or we need to change your treatment, we will call you to review the results.   Testing/Procedures: NONE ordered at this time of appointment     Follow-Up: At [facility], you and your health needs are our priority.  As part of our continuing mission to provide you with exceptional heart care, we have created designated Provider Care Teams.  These Care Teams include your primary Cardiologist (physician) and Advanced Practice Providers (APPs -  Physician Assistants and Nurse Practitioners) who all work together to provide you with the care you need, when you need it.  We recommend signing up for the patient portal called "MyChart".  Sign up information is provided on this After Visit Summary.  MyChart is used to connect with patients for Virtual Visits (Telemedicine).  Patients are able to view lab/test results, encounter notes, upcoming appointments, etc.  Non-urgent messages can be sent to your provider as well.   To learn more about what you can do with MyChart, go to [email or web address].    Your next appointment:   6 month(s)  Provider:   Donato Heinz, MD     Other Instructions

## 2022-10-03 ENCOUNTER — Other Ambulatory Visit: Payer: Self-pay | Admitting: Family

## 2022-10-03 DIAGNOSIS — I10 Essential (primary) hypertension: Secondary | ICD-10-CM

## 2022-10-03 NOTE — Addendum Note (Signed)
Addended by: Derrick Ravel on: [id number] PM   Modules accepted: Orders

## 2022-10-04 MED ORDER — LISINOPRIL 40 MG PO TABS: 40.0000 mg | ORAL_TABLET | Freq: Every day | ORAL | 1 refills | Status: DC

## 2022-10-04 NOTE — Telephone Encounter (Signed)
PATIENT HAS BEEN TAKING LISINOPRIL SOMEONE HERE TOLD HER IT WAS DISCONTINUED BUT PATIENT HAS BEEN TAKING CAN WE SEND REFILL TO [facility]?  SAID IT WAS HISTORICAL PROVIDER. CALLED AND SPOKE WITH DAUGHTER WANTS SENT IN. pLEASE ADVISE

## 2022-10-10 ENCOUNTER — Other Ambulatory Visit: Payer: Self-pay | Admitting: Family

## 2022-10-10 ENCOUNTER — Ambulatory Visit: Payer: Medicare PPO | Admitting: Cardiology

## 2022-10-10 DIAGNOSIS — R051 Acute cough: Secondary | ICD-10-CM

## 2022-10-10 DIAGNOSIS — B351 Tinea unguium: Secondary | ICD-10-CM | POA: Diagnosis not present

## 2022-10-10 DIAGNOSIS — B9689 Other specified bacterial agents as the cause of diseases classified elsewhere: Secondary | ICD-10-CM

## 2022-10-10 DIAGNOSIS — L84 Corns and callosities: Secondary | ICD-10-CM | POA: Diagnosis not present

## 2022-10-10 DIAGNOSIS — M79676 Pain in unspecified toe(s): Secondary | ICD-10-CM | POA: Diagnosis not present

## 2022-10-10 DIAGNOSIS — E1142 Type 2 diabetes mellitus with diabetic polyneuropathy: Secondary | ICD-10-CM | POA: Diagnosis not present

## 2022-10-16 DIAGNOSIS — J3081 Allergic rhinitis due to animal (cat) (dog) hair and dander: Secondary | ICD-10-CM | POA: Diagnosis not present

## 2022-10-16 DIAGNOSIS — J3089 Other allergic rhinitis: Secondary | ICD-10-CM | POA: Diagnosis not present

## 2022-10-16 DIAGNOSIS — J301 Allergic rhinitis due to pollen: Secondary | ICD-10-CM | POA: Diagnosis not present

## 2022-10-23 ENCOUNTER — Other Ambulatory Visit: Payer: Self-pay | Admitting: Family

## 2022-10-23 DIAGNOSIS — E1142 Type 2 diabetes mellitus with diabetic polyneuropathy: Secondary | ICD-10-CM

## 2022-10-23 DIAGNOSIS — J3081 Allergic rhinitis due to animal (cat) (dog) hair and dander: Secondary | ICD-10-CM | POA: Diagnosis not present

## 2022-10-23 DIAGNOSIS — J3089 Other allergic rhinitis: Secondary | ICD-10-CM | POA: Diagnosis not present

## 2022-10-23 DIAGNOSIS — J301 Allergic rhinitis due to pollen: Secondary | ICD-10-CM | POA: Diagnosis not present

## 2022-10-23 DIAGNOSIS — F411 Generalized anxiety disorder: Secondary | ICD-10-CM

## 2022-10-23 DIAGNOSIS — E039 Hypothyroidism, unspecified: Secondary | ICD-10-CM

## 2022-10-23 DIAGNOSIS — I10 Essential (primary) hypertension: Secondary | ICD-10-CM

## 2022-10-23 DIAGNOSIS — F331 Major depressive disorder, recurrent, moderate: Secondary | ICD-10-CM

## 2022-10-23 NOTE — Telephone Encounter (Signed)
Refill given. Patient needs a follow up with [facility] for lab work.

## 2022-10-24 ENCOUNTER — Ambulatory Visit: Payer: Medicare PPO | Admitting: Physician Assistant

## 2022-10-24 NOTE — Telephone Encounter (Signed)
I called pt & made an appt on 11-12-2022 w/[facility].

## 2022-11-01 DIAGNOSIS — J3081 Allergic rhinitis due to animal (cat) (dog) hair and dander: Secondary | ICD-10-CM | POA: Diagnosis not present

## 2022-11-01 DIAGNOSIS — J3089 Other allergic rhinitis: Secondary | ICD-10-CM | POA: Diagnosis not present

## 2022-11-01 DIAGNOSIS — J301 Allergic rhinitis due to pollen: Secondary | ICD-10-CM | POA: Diagnosis not present

## 2022-11-08 DIAGNOSIS — J3081 Allergic rhinitis due to animal (cat) (dog) hair and dander: Secondary | ICD-10-CM | POA: Diagnosis not present

## 2022-11-08 DIAGNOSIS — J3089 Other allergic rhinitis: Secondary | ICD-10-CM | POA: Diagnosis not present

## 2022-11-08 DIAGNOSIS — J301 Allergic rhinitis due to pollen: Secondary | ICD-10-CM | POA: Diagnosis not present

## 2022-11-12 ENCOUNTER — Encounter: Payer: Self-pay | Admitting: Family

## 2022-11-12 ENCOUNTER — Ambulatory Visit: Payer: Medicare PPO | Admitting: Family

## 2022-11-12 VITALS — BP 118/51 | HR 65 | Temp 97.5°F | Ht 64.0 in | Wt 164.0 lb

## 2022-11-12 DIAGNOSIS — I10 Essential (primary) hypertension: Secondary | ICD-10-CM | POA: Diagnosis not present

## 2022-11-12 DIAGNOSIS — E1142 Type 2 diabetes mellitus with diabetic polyneuropathy: Secondary | ICD-10-CM

## 2022-11-12 DIAGNOSIS — E039 Hypothyroidism, unspecified: Secondary | ICD-10-CM | POA: Diagnosis not present

## 2022-11-12 DIAGNOSIS — F0280 Dementia in other diseases classified elsewhere without behavioral disturbance: Secondary | ICD-10-CM

## 2022-11-12 DIAGNOSIS — E785 Hyperlipidemia, unspecified: Secondary | ICD-10-CM | POA: Diagnosis not present

## 2022-11-12 DIAGNOSIS — I700 Atherosclerosis of aorta: Secondary | ICD-10-CM | POA: Diagnosis not present

## 2022-11-12 DIAGNOSIS — E1169 Type 2 diabetes mellitus with other specified complication: Secondary | ICD-10-CM

## 2022-11-12 DIAGNOSIS — F411 Generalized anxiety disorder: Secondary | ICD-10-CM

## 2022-11-12 DIAGNOSIS — R051 Acute cough: Secondary | ICD-10-CM

## 2022-11-12 DIAGNOSIS — Z0000 Encounter for general adult medical examination without abnormal findings: Secondary | ICD-10-CM

## 2022-11-12 DIAGNOSIS — Z0001 Encounter for general adult medical examination with abnormal findings: Secondary | ICD-10-CM

## 2022-11-12 DIAGNOSIS — F331 Major depressive disorder, recurrent, moderate: Secondary | ICD-10-CM

## 2022-11-12 DIAGNOSIS — D509 Iron deficiency anemia, unspecified: Secondary | ICD-10-CM

## 2022-11-12 DIAGNOSIS — G3101 Pick's disease: Secondary | ICD-10-CM

## 2022-11-12 DIAGNOSIS — J4520 Mild intermittent asthma, uncomplicated: Secondary | ICD-10-CM

## 2022-11-12 DIAGNOSIS — E559 Vitamin D deficiency, unspecified: Secondary | ICD-10-CM

## 2022-11-12 LAB — BAYER DCA HB A1C WAIVED: HB A1C (BAYER DCA - WAIVED): 7.8 % — ABNORMAL HIGH (ref 4.8–5.6)

## 2022-11-12 MED ORDER — MECLIZINE HCL 25 MG PO TABS: 25.0000 mg | ORAL_TABLET | Freq: Three times a day (TID) | ORAL | 0 refills | Status: DC | PRN

## 2022-11-12 MED ORDER — SITAGLIPTIN PHOSPHATE 50 MG PO TABS: 50.0000 mg | ORAL_TABLET | Freq: Every day | ORAL | 0 refills | Status: DC

## 2022-11-12 MED ORDER — DOXAZOSIN MESYLATE 8 MG PO TABS: ORAL_TABLET | ORAL | 2 refills | Status: DC

## 2022-11-12 MED ORDER — CETIRIZINE HCL 10 MG PO TABS: 10.0000 mg | ORAL_TABLET | Freq: Every day | ORAL | 0 refills | Status: DC

## 2022-11-12 MED ORDER — METOPROLOL SUCCINATE ER 100 MG PO TB24: ORAL_TABLET | ORAL | 0 refills | Status: DC

## 2022-11-12 MED ORDER — MONTELUKAST SODIUM 10 MG PO TABS: 10.0000 mg | ORAL_TABLET | Freq: Every day | ORAL | 5 refills | Status: DC

## 2022-11-12 MED ORDER — LEVOTHYROXINE SODIUM 75 MCG PO TABS: 75.0000 ug | ORAL_TABLET | Freq: Every day | ORAL | 0 refills | Status: DC

## 2022-11-12 MED ORDER — METFORMIN HCL 1000 MG PO TABS: ORAL_TABLET | ORAL | 3 refills | Status: DC

## 2022-11-12 MED ORDER — ROSUVASTATIN CALCIUM 20 MG PO TABS: 20.0000 mg | ORAL_TABLET | Freq: Every day | ORAL | 3 refills | Status: DC

## 2022-11-12 MED ORDER — LISINOPRIL 40 MG PO TABS: 40.0000 mg | ORAL_TABLET | Freq: Every day | ORAL | 1 refills | Status: DC

## 2022-11-12 MED ORDER — AMLODIPINE BESYLATE 5 MG PO TABS: 5.0000 mg | ORAL_TABLET | Freq: Every day | ORAL | 0 refills | Status: DC

## 2022-11-12 MED ORDER — ESCITALOPRAM OXALATE 20 MG PO TABS: 20.0000 mg | ORAL_TABLET | Freq: Every day | ORAL | 0 refills | Status: DC

## 2022-11-12 NOTE — Progress Notes (Signed)
Subjective:    Patient ID: Kristi Willis, female    DOB: [birth year], 82 y.o.   MRN: [id number]  Chief Complaint  Patient presents with   Medical Management of Chronic Issues   Pt and daughter present today for CPE and chronic follow up. She has aphasia so her daughter is doing most of the talking for her today.    She is followed by podiatry every 6 weeks. She is followed by Cardiologists annually.  Followed by Endo for hypothyroid and DM as needed. She is getting allergy injections weekly.   Has aortic atherosclerosis and takes Crestor daily. Hypertension This is a chronic problem. The current episode started more than 1 year ago. The problem has been resolved since onset. The problem is controlled. Associated symptoms include anxiety, malaise/fatigue and peripheral edema (trace). Pertinent negatives include no blurred vision or shortness of breath. Risk factors for coronary artery disease include dyslipidemia and sedentary lifestyle. The current treatment provides moderate improvement. Identifiable causes of hypertension include a thyroid problem.  Thyroid Problem Presents for follow-up visit. Symptoms include anxiety, depressed mood and fatigue. Patient reports no constipation, diaphoresis or dry skin. The symptoms have been stable. Her past medical history is significant for hyperlipidemia.  Hyperlipidemia This is a chronic problem. The current episode started more than 1 year ago. The problem is controlled. Recent lipid tests were reviewed and are normal. Pertinent negatives include no shortness of breath. Current antihyperlipidemic treatment includes statins. The current treatment provides moderate improvement of lipids. Risk factors for coronary artery disease include dyslipidemia, diabetes mellitus, hypertension and a sedentary lifestyle.  Diabetes She presents for her follow-up diabetic visit. She has type 2 diabetes mellitus. Hypoglycemia symptoms include  nervousness/anxiousness. Associated symptoms include fatigue and foot paresthesias. Pertinent negatives for diabetes include no blurred vision. Symptoms are stable. Diabetic complications include peripheral neuropathy. Risk factors for coronary artery disease include dyslipidemia, diabetes mellitus, hypertension, sedentary lifestyle and post-menopausal. She is following a generally healthy diet. (Does not check glucose at home ) Eye exam is current.  Anemia Presents for follow-up visit. Symptoms include malaise/fatigue. There are no compliance problems.   Anxiety Presents for follow-up visit. Symptoms include depressed mood, excessive worry, nervous/anxious behavior and restlessness. Patient reports no irritability or shortness of breath. Symptoms occur most days. The severity of symptoms is moderate.   Her past medical history is significant for anemia and asthma.  Depression        This is a chronic problem.  The current episode started more than 1 year ago.   The problem occurs intermittently.  Associated symptoms include fatigue and restlessness.  Associated symptoms include no helplessness, no hopelessness and not sad.  Past medical history includes thyroid problem and anxiety.   Asthma She complains of cough and wheezing. There is no shortness of breath. This is a chronic problem. The current episode started more than 1 year ago. The problem occurs intermittently. Associated symptoms include malaise/fatigue. She reports moderate improvement on treatment. Her past medical history is significant for asthma.      Review of Systems  Constitutional:  Positive for fatigue and malaise/fatigue. Negative for diaphoresis and irritability.  Eyes:  Negative for blurred vision.  Respiratory:  Positive for cough and wheezing. Negative for shortness of breath.   Gastrointestinal:  Negative for constipation.  Psychiatric/Behavioral:  Positive for depression. The patient is nervous/anxious.   All other  systems reviewed and are negative.  Family History  Problem Relation Age of Onset   Cancer Mother  17       cervical cancer   Depression Mother    Heart disease Father    Stroke Father    Cancer Father        lung cancer   Heart disease Sister 53       MI   Hypertension Sister    Cancer Sister 58       breast cancer   Healthy Daughter    Social History   Socioeconomic History   Marital status: Widowed    Spouse name: Not on file   Number of children: 1   Years of education: Not on file   Highest education level: 12th grade  Occupational History   Occupation: Retired from [employer]: retired  Tobacco Use   Smoking status: Never   Smokeless tobacco: Never  Vaping Use   Vaping Use: Never used  Substance and Sexual Activity   Alcohol use: No   Drug use: No   Sexual activity: Not Currently  Other Topics Concern   Not on file  Social History Narrative   Lives home alone - daughter lives about 5 miles away.   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   Social Determinants of Health   Financial Resource Strain: Low Risk  (03/06/2022)   Overall Financial Resource Strain (CARDIA)    Difficulty of Paying Living Expenses: Not hard at all  Food Insecurity: No Food Insecurity (03/06/2022)   Hunger Vital Sign    Worried About Running Out of Food in the Last Year: Never true    Ran Out of Food in the Last Year: Never true  Transportation Needs: No Transportation Needs (03/06/2022)   PRAPARE - Hydrologist (Medical): No    Lack of Transportation (Non-Medical): No  Physical Activity: Insufficiently Active (03/06/2022)   Exercise Vital Sign    Days of Exercise per Week: 7 days    Minutes of  Exercise per Session: 10 min  Stress: No Stress Concern Present (03/06/2022)   [place]    Feeling of Stress : Not at all  Social Connections: Moderately Integrated (03/06/2022)   Social Connection and Isolation Panel [NHANES]    Frequency of Communication with Friends and Family: More than three times a week    Frequency of Social Gatherings with Friends and Family: More than three times a week    Attends Religious Services: More than 4 times per year    Active Member of [employer] or Organizations: Yes    Attends Archivist Meetings: More than 4 times per year    Marital Status: Widowed        Objective:   Physical Exam Vitals reviewed.  Constitutional:      General: She is not in acute distress.    Appearance: She is well-developed.  HENT:     Head: Normocephalic and atraumatic.     Right Ear: Tympanic membrane normal.     Left Ear: Tympanic membrane normal.  Eyes:     Pupils: Pupils are equal, round, and reactive to light.  Neck:     Thyroid: No thyromegaly.  Cardiovascular:     Rate and Rhythm: Normal rate and  regular rhythm.     Heart sounds: Normal heart sounds. No murmur heard. Pulmonary:     Effort: Pulmonary effort is normal. No respiratory distress.     Breath sounds: Normal breath sounds. No wheezing.  Abdominal:     General: Bowel sounds are normal. There is no distension.     Palpations: Abdomen is soft.     Tenderness: There is no abdominal tenderness.  Musculoskeletal:        General: No tenderness. Normal range of motion.     Cervical back: Normal range of motion and neck supple.  Skin:    General: Skin is warm and dry.  Neurological:     Mental Status: She is alert and oriented to person, place, and time.     Cranial Nerves: No cranial nerve deficit.     Motor: Weakness present.     Deep Tendon Reflexes: Reflexes are normal and symmetric.     Comments: aphasia  Psychiatric:         Behavior: Behavior normal.        Thought Content: Thought content normal.        Judgment: Judgment normal.       BP (!) 118/51   Pulse 65   Temp (!) 97.5 F (36.4 C) (Temporal)   Ht 5\' 4"  (1.626 m)   Wt 164 lb (74.4 kg)   SpO2 97%   BMI 28.15 kg/m      Assessment & Plan:  Kristi Willis comes in today with chief complaint of Medical Management of Chronic Issues   Diagnosis and orders addressed:  1. Essential hypertension - amLODipine (NORVASC) 5 MG tablet; Take 1 tablet (5 mg total) by mouth daily.  Dispense: 30 tablet; Refill: 0 - doxazosin (CARDURA) 8 MG tablet; TAKE (1) TABLET DAILY FOR HIGH BLOOD PRESSURE.  Dispense: 90 tablet; Refill: 2 - CMP14+EGFR - CBC with Differential/Platelet  2. Acute cough - cetirizine (ZYRTEC) 10 MG tablet; Take 1 tablet (10 mg total) by mouth daily.  Dispense: 90 tablet; Refill: 0 - CMP14+EGFR - CBC with Differential/Platelet  3. GAD (generalized anxiety disorder) - escitalopram (LEXAPRO) 20 MG tablet; Take 1 tablet (20 mg total) by mouth daily.  Dispense: 90 tablet; Refill: 0 - CMP14+EGFR - CBC with Differential/Platelet  4. Moderate episode of recurrent major depressive disorder (HCC)  - escitalopram (LEXAPRO) 20 MG tablet; Take 1 tablet (20 mg total) by mouth daily.  Dispense: 90 tablet; Refill: 0 - CMP14+EGFR - CBC with Differential/Platelet  5. Type 2 diabetes mellitus with diabetic polyneuropathy, without long-term current use of insulin (HCC) - sitaGLIPtin (JANUVIA) 50 MG tablet; Take 1 tablet (50 mg total) by mouth daily.  Dispense: 90 tablet; Refill: 0 - metFORMIN (GLUCOPHAGE) 1000 MG tablet; TAKE  (1)  TABLET TWICE A DAY WITH MEALS (BREAKFAST AND SUPPER)  Dispense: 180 tablet; Refill: 3 - Bayer DCA Hb A1c Waived - CMP14+EGFR - CBC with Differential/Platelet - Microalbumin / creatinine urine ratio  6. Hypothyroidism, unspecified type  - levothyroxine (SYNTHROID) 75 MCG tablet; Take 1 tablet (75 mcg total) by  mouth daily before breakfast. Patient needs to be seen for any further refills  Dispense: 90 tablet; Refill: 0 - CMP14+EGFR - CBC with Differential/Platelet - TSH  7. Hyperlipidemia associated with type 2 diabetes mellitus (HCC)  - rosuvastatin (CRESTOR) 20 MG tablet; Take 1 tablet (20 mg total) by mouth at bedtime.  Dispense: 90 tablet; Refill: 3 - CMP14+EGFR - CBC with Differential/Platelet  8. Mild intermittent asthma without complication -  montelukast (SINGULAIR) 10 MG tablet; Take 1 tablet (10 mg total) by mouth at bedtime.  Dispense: 30 tablet; Refill: 5 - CMP14+EGFR - CBC with Differential/Platelet  9. Vitamin D deficiency  - CMP14+EGFR - CBC with Differential/Platelet - VITAMIN D 25 Hydroxy (Vit-D Deficiency, Fractures)  10. Iron deficiency anemia, unspecified iron deficiency anemia type - CMP14+EGFR - CBC with Differential/Platelet  11. Primary progressive aphasia ([place])  - CMP14+EGFR - CBC with Differential/Platelet  12. Aortic atherosclerosis (HCC) - CMP14+EGFR - CBC with Differential/Platelet  13. Annual physical exam  - Bayer DCA Hb A1c Waived - CMP14+EGFR - CBC with Differential/Platelet - Lipid panel - Microalbumin / creatinine urine ratio - TSH - VITAMIN D 25 Hydroxy (Vit-D Deficiency, Fractures)   Labs pending Health Maintenance reviewed Diet and exercise encouraged  Follow up plan: 6 months    Evelina Dun, FNP

## 2022-11-12 NOTE — Patient Instructions (Signed)
Health Maintenance After Age 82 After age 82, you are at a higher risk for certain long-term diseases and infections as well as injuries from falls. Falls are a major cause of broken bones and head injuries in people who are older than age 82. Getting regular preventive care can help to keep you healthy and well. Preventive care includes getting regular testing and making lifestyle changes as recommended by your health care provider. Talk with your health care provider about: Which screenings and tests you should have. A screening is a test that checks for a disease when you have no symptoms. A diet and exercise plan that is right for you. What should I know about screenings and tests to prevent falls? Screening and testing are the best ways to find a health problem early. Early diagnosis and treatment give you the best chance of managing medical conditions that are common after age 82. Certain conditions and lifestyle choices may make you more likely to have a fall. Your health care provider may recommend: Regular vision checks. Poor vision and conditions such as cataracts can make you more likely to have a fall. If you wear glasses, make sure to get your prescription updated if your vision changes. Medicine review. Work with your health care provider to regularly review all of the medicines you are taking, including over-the-counter medicines. Ask your health care provider about any side effects that may make you more likely to have a fall. Tell your health care provider if any medicines that you take make you feel dizzy or sleepy. Strength and balance checks. Your health care provider may recommend certain tests to check your strength and balance while standing, walking, or changing positions. Foot health exam. Foot pain and numbness, as well as not wearing proper footwear, can make you more likely to have a fall. Screenings, including: Osteoporosis screening. Osteoporosis is a condition that causes  the bones to get weaker and break more easily. Blood pressure screening. Blood pressure changes and medicines to control blood pressure can make you feel dizzy. Depression screening. You may be more likely to have a fall if you have a fear of falling, feel depressed, or feel unable to do activities that you used to do. Alcohol use screening. Using too much alcohol can affect your balance and may make you more likely to have a fall. Follow these instructions at home: Lifestyle Do not drink alcohol if: Your health care provider tells you not to drink. If you drink alcohol: Limit how much you have to: 0-1 drink a day for women. 0-2 drinks a day for men. Know how much alcohol is in your drink. In the [place] one drink equals one 12 oz bottle of beer (355 mL), one 5 oz glass of wine (148 mL), or one 1 oz glass of hard liquor (44 mL). Do not use any products that contain nicotine or tobacco. These products include cigarettes, chewing tobacco, and vaping devices, such as e-cigarettes. If you need help quitting, ask your health care provider. Activity  Follow a regular exercise program to stay fit. This will help you maintain your balance. Ask your health care provider what types of exercise are appropriate for you. If you need a cane or walker, use it as recommended by your health care provider. Wear supportive shoes that have nonskid soles. Safety  Remove any tripping hazards, such as rugs, cords, and clutter. Install safety equipment such as grab bars in bathrooms and safety rails on stairs. Keep rooms and walkways   well-lit. General instructions Talk with your health care provider about your risks for falling. Tell your health care provider if: You fall. Be sure to tell your health care provider about all falls, even ones that seem minor. You feel dizzy, tiredness (fatigue), or off-balance. Take over-the-counter and prescription medicines only as told by your health care provider. These include  supplements. Eat a healthy diet and maintain a healthy weight. A healthy diet includes low-fat dairy products, low-fat (lean) meats, and fiber from whole grains, beans, and lots of fruits and vegetables. Stay current with your vaccines. Schedule regular health, dental, and eye exams. Summary Having a healthy lifestyle and getting preventive care can help to protect your health and wellness after age 82. Screening and testing are the best way to find a health problem early and help you avoid having a fall. Early diagnosis and treatment give you the best chance for managing medical conditions that are more common for people who are older than age 82. Falls are a major cause of broken bones and head injuries in people who are older than age 82. Take precautions to prevent a fall at home. Work with your health care provider to learn what changes you can make to improve your health and wellness and to prevent falls. This information is not intended to replace advice given to you by your health care provider. Make sure you discuss any questions you have with your health care provider. Document Revised: 12/25/2020 Document Reviewed: 12/25/2020 Elsevier Patient Education  2023 [employer]  

## 2022-11-13 LAB — LIPID PANEL
Chol/HDL Ratio: 2.3 ratio (ref 0.0–4.4)
Cholesterol, Total: 131 mg/dL (ref 100–199)
HDL: 56 mg/dL (ref >39)
LDL Chol Calc (NIH): 45 mg/dL (ref 0–99)
Triglycerides: 189 mg/dL — ABNORMAL HIGH (ref 0–149)
VLDL Cholesterol Cal: 30 mg/dL (ref 5–40)

## 2022-11-13 LAB — CMP14+EGFR
ALT: 18 IU/L (ref 0–32)
AST: 25 IU/L (ref 0–40)
Albumin/Globulin Ratio: 2.1 (ref 1.2–2.2)
Albumin: 4.5 g/dL (ref 3.7–4.7)
Alkaline Phosphatase: 108 IU/L (ref 44–121)
BUN/Creatinine Ratio: 24 (ref 12–28)
BUN: 16 mg/dL (ref 8–27)
Bilirubin Total: 0.4 mg/dL (ref 0.0–1.2)
CO2: 22 mmol/L (ref 20–29)
Calcium: 9.7 mg/dL (ref 8.7–10.3)
Chloride: 101 mmol/L (ref 96–106)
Creatinine, Ser: 0.67 mg/dL (ref 0.57–1.00)
Globulin, Total: 2.1 g/dL (ref 1.5–4.5)
Glucose: 148 mg/dL — ABNORMAL HIGH (ref 70–99)
Potassium: 4.4 mmol/L (ref 3.5–5.2)
Sodium: 143 mmol/L (ref 134–144)
Total Protein: 6.6 g/dL (ref 6.0–8.5)
eGFR: 88 mL/min/{1.73_m2} (ref >59)

## 2022-11-13 LAB — MICROALBUMIN / CREATININE URINE RATIO
Creatinine, Urine: 181.9 mg/dL
Microalb/Creat Ratio: 100 mg/g creat — ABNORMAL HIGH (ref 0–29)
Microalbumin, Urine: 181.3 ug/mL

## 2022-11-13 LAB — CBC WITH DIFFERENTIAL/PLATELET
Basophils Absolute: 0 10*3/uL (ref 0.0–0.2)
Basos: 0 %
EOS (ABSOLUTE): 0.2 10*3/uL (ref 0.0–0.4)
Eos: 2 %
Hematocrit: 34.3 % (ref 34.0–46.6)
Hemoglobin: 10.9 g/dL — ABNORMAL LOW (ref 11.1–15.9)
Immature Grans (Abs): 0 10*3/uL (ref 0.0–0.1)
Immature Granulocytes: 0 %
Lymphocytes Absolute: 2.4 10*3/uL (ref 0.7–3.1)
Lymphs: 26 %
MCH: 26.5 pg — ABNORMAL LOW (ref 26.6–33.0)
MCHC: 31.8 g/dL (ref 31.5–35.7)
MCV: 83 fL (ref 79–97)
Monocytes Absolute: 0.7 10*3/uL (ref 0.1–0.9)
Monocytes: 7 %
Neutrophils Absolute: 5.8 10*3/uL (ref 1.4–7.0)
Neutrophils: 65 %
Platelets: 266 10*3/uL (ref 150–450)
RBC: 4.12 x10E6/uL (ref 3.77–5.28)
RDW: 14.6 % (ref 11.7–15.4)
WBC: 9 10*3/uL (ref 3.4–10.8)

## 2022-11-13 LAB — VITAMIN D 25 HYDROXY (VIT D DEFICIENCY, FRACTURES): Vit D, 25-Hydroxy: 54 ng/mL (ref 30.0–100.0)

## 2022-11-13 LAB — TSH: TSH: 0.68 u[IU]/mL (ref 0.450–4.500)

## 2022-11-22 DIAGNOSIS — J301 Allergic rhinitis due to pollen: Secondary | ICD-10-CM | POA: Diagnosis not present

## 2022-11-22 DIAGNOSIS — J3081 Allergic rhinitis due to animal (cat) (dog) hair and dander: Secondary | ICD-10-CM | POA: Diagnosis not present

## 2022-11-22 DIAGNOSIS — J3089 Other allergic rhinitis: Secondary | ICD-10-CM | POA: Diagnosis not present

## 2022-11-24 ENCOUNTER — Encounter: Payer: Self-pay | Admitting: Physician Assistant

## 2022-11-29 DIAGNOSIS — J3089 Other allergic rhinitis: Secondary | ICD-10-CM | POA: Diagnosis not present

## 2022-11-29 DIAGNOSIS — J301 Allergic rhinitis due to pollen: Secondary | ICD-10-CM | POA: Diagnosis not present

## 2022-11-29 DIAGNOSIS — J3081 Allergic rhinitis due to animal (cat) (dog) hair and dander: Secondary | ICD-10-CM | POA: Diagnosis not present

## 2022-12-04 ENCOUNTER — Ambulatory Visit: Payer: Medicare PPO | Admitting: Physician Assistant

## 2022-12-04 ENCOUNTER — Encounter: Payer: Self-pay | Admitting: Physician Assistant

## 2022-12-04 VITALS — BP 124/78 | HR 74 | Resp 18 | Ht 64.0 in

## 2022-12-04 DIAGNOSIS — F0280 Dementia in other diseases classified elsewhere without behavioral disturbance: Secondary | ICD-10-CM

## 2022-12-04 DIAGNOSIS — J3081 Allergic rhinitis due to animal (cat) (dog) hair and dander: Secondary | ICD-10-CM | POA: Diagnosis not present

## 2022-12-04 DIAGNOSIS — G3101 Pick's disease: Secondary | ICD-10-CM | POA: Diagnosis not present

## 2022-12-04 DIAGNOSIS — J3089 Other allergic rhinitis: Secondary | ICD-10-CM | POA: Diagnosis not present

## 2022-12-04 DIAGNOSIS — J301 Allergic rhinitis due to pollen: Secondary | ICD-10-CM | POA: Diagnosis not present

## 2022-12-04 NOTE — Patient Instructions (Signed)
Follow in 1 year Continue with your medicines  Continue Lexapro 20 mg daily
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
## 2022-12-11 ENCOUNTER — Other Ambulatory Visit: Payer: Self-pay | Admitting: Family

## 2022-12-11 NOTE — Telephone Encounter (Signed)
[facility] pt NTBS 30-d given 11/14/22

## 2022-12-12 MED ORDER — METOPROLOL SUCCINATE ER 100 MG PO TB24: ORAL_TABLET | ORAL | 1 refills | Status: DC

## 2022-12-12 NOTE — Addendum Note (Signed)
Addended by: Jannifer Rodney A on: 12/12/2022 12:52 PM   Modules accepted: Orders

## 2022-12-12 NOTE — Telephone Encounter (Signed)
Prescription sent to pharmacy.

## 2022-12-13 DIAGNOSIS — J3081 Allergic rhinitis due to animal (cat) (dog) hair and dander: Secondary | ICD-10-CM | POA: Diagnosis not present

## 2022-12-13 DIAGNOSIS — J3089 Other allergic rhinitis: Secondary | ICD-10-CM | POA: Diagnosis not present

## 2022-12-13 DIAGNOSIS — J301 Allergic rhinitis due to pollen: Secondary | ICD-10-CM | POA: Diagnosis not present

## 2022-12-19 DIAGNOSIS — E1142 Type 2 diabetes mellitus with diabetic polyneuropathy: Secondary | ICD-10-CM | POA: Diagnosis not present

## 2022-12-19 DIAGNOSIS — B351 Tinea unguium: Secondary | ICD-10-CM | POA: Diagnosis not present

## 2022-12-19 DIAGNOSIS — L84 Corns and callosities: Secondary | ICD-10-CM | POA: Diagnosis not present

## 2022-12-19 DIAGNOSIS — M79676 Pain in unspecified toe(s): Secondary | ICD-10-CM | POA: Diagnosis not present

## 2022-12-20 DIAGNOSIS — J301 Allergic rhinitis due to pollen: Secondary | ICD-10-CM | POA: Diagnosis not present

## 2022-12-20 DIAGNOSIS — J3089 Other allergic rhinitis: Secondary | ICD-10-CM | POA: Diagnosis not present

## 2022-12-20 DIAGNOSIS — J3081 Allergic rhinitis due to animal (cat) (dog) hair and dander: Secondary | ICD-10-CM | POA: Diagnosis not present

## 2022-12-25 ENCOUNTER — Other Ambulatory Visit: Payer: Self-pay | Admitting: Family

## 2022-12-25 DIAGNOSIS — I10 Essential (primary) hypertension: Secondary | ICD-10-CM

## 2023-01-03 DIAGNOSIS — J3089 Other allergic rhinitis: Secondary | ICD-10-CM | POA: Diagnosis not present

## 2023-01-03 DIAGNOSIS — J3081 Allergic rhinitis due to animal (cat) (dog) hair and dander: Secondary | ICD-10-CM | POA: Diagnosis not present

## 2023-01-03 DIAGNOSIS — J301 Allergic rhinitis due to pollen: Secondary | ICD-10-CM | POA: Diagnosis not present

## 2023-01-09 ENCOUNTER — Telehealth (INDEPENDENT_AMBULATORY_CARE_PROVIDER_SITE_OTHER): Payer: Medicare PPO | Admitting: Family Medicine

## 2023-01-09 ENCOUNTER — Encounter: Payer: Self-pay | Admitting: Family Medicine

## 2023-01-09 DIAGNOSIS — R238 Other skin changes: Secondary | ICD-10-CM | POA: Diagnosis not present

## 2023-01-09 MED ORDER — TRIAMCINOLONE ACETONIDE 0.1 % EX CREA: 1.0000 | TOPICAL_CREAM | Freq: Two times a day (BID) | CUTANEOUS | 0 refills | Status: DC

## 2023-01-09 NOTE — Progress Notes (Signed)
Virtual Visit via Video   I connected with patient on 01/09/23 at 1610 by a video enabled telemedicine application and verified that I am speaking with the correct person using two identifiers.  Location patient: Home Location provider: [facility] Persons participating in the virtual visit: Patient and Provider  I discussed the limitations of evaluation and management by telemedicine and the availability of in person appointments. The patient expressed understanding and agreed to proceed.  Subjective:   HPI:  Pt presents today for  Chief Complaint  Patient presents with   Rash   Pt is accompanied by her daughter during video visit. Daughter states she noticed some red areas on pts arms that looked like insect bites. Has a few scattered to her bilateral arms. Raised with redness. No known contributor.  Review of Systems  Skin:  Positive for itching and rash.  All other systems reviewed and are negative.    Patient Active Problem List   Diagnosis Date Noted   Allergic rhinitis 03/05/2022   Pulmonary congestion 02/11/2021   Aortic atherosclerosis (HCC) 02/11/2021   Hypo-osmolar hyponatremia 02/10/2021   Primary progressive aphasia (HCC) 03/23/2020   Pain in left shoulder 11/03/2019   Acute metabolic encephalopathy 08/05/2019   Iron deficiency anemia 08/05/2019   QT prolongation 08/05/2019   Hypothyroidism 02/12/2019   Speech articulation disorder 09/01/2018   Essential hypertension 10/03/2017   GAD (generalized anxiety disorder) 07/03/2017   Diabetes mellitus (HCC) 09/27/2016   Depression 03/29/2016   Insomnia 07/18/2015   Vitamin D deficiency 10/11/2013   Right carotid bruit 03/02/2013   Degenerative disc disease, lumbar 11/27/2012   Asthma 11/13/2010   Hyperlipidemia associated with type 2 diabetes mellitus (HCC) 11/13/2010   Cancer of skin 11/13/2010   Diabetic neuropathy (HCC) 11/13/2010   Wide-complex tachycardia==Atrial  Tachycardia 11/13/2010    Social History   Tobacco Use   Smoking status: Never   Smokeless tobacco: Never  Substance Use Topics   Alcohol use: No    Current Outpatient Medications:    triamcinolone cream (KENALOG) 0.1 %, Apply 1 Application topically 2 (two) times daily., Disp: 30 g, Rfl: 0   Accu-Chek Softclix Lancets lancets, CHECK BLOOD SUGAR ONCE DAILY OR AS DIRECTED Dx E11.40, Disp: 100 each, Rfl: 3   acetaminophen (TYLENOL) 325 MG tablet, Take 2 tablets (650 mg total) by mouth every 6 (six) hours as needed for mild pain, fever or moderate pain., Disp: 60 tablet, Rfl: 0   acyclovir ointment (ZOVIRAX) 5 %, APPLY TO THE AFFECTED AREA AROUND MOUTH EVERY 3 HOURS AS DIRECTED UNTIL CLEAR, Disp: 15 g, Rfl: 0   albuterol (PROVENTIL) (2.5 MG/3ML) 0.083% nebulizer solution, Take 3 mLs (2.5 mg total) by nebulization every 6 (six) hours as needed for wheezing or shortness of breath., Disp: 150 mL, Rfl: 1   albuterol (VENTOLIN HFA) 108 (90 Base) MCG/ACT inhaler, Inhale 2 puffs into the lungs every 6 (six) hours as needed for wheezing or shortness of breath., Disp: 1 each, Rfl: 3   amLODipine (NORVASC) 5 MG tablet, Take 1 tablet (5 mg total) by mouth daily., Disp: 30 tablet, Rfl: 3   aspirin 81 MG chewable tablet, Chew 81 mg by mouth at bedtime., Disp: , Rfl:    azelastine (ASTELIN) 0.1 % nasal spray, Place 2 sprays into both nostrils 2 (two) times daily., Disp: 30 mL, Rfl: 11   Blood Glucose Monitoring Suppl (ACCU-CHEK AVIVA PLUS) w/Device KIT, Check sugars daily & as needed Dx E11.9, Disp: 1 kit, Rfl:  0   cetirizine (ZYRTEC) 10 MG tablet, Take 1 tablet (10 mg total) by mouth daily., Disp: 90 tablet, Rfl: 0   Coenzyme Q10 (COQ10 PO), Take 1 tablet by mouth daily., Disp: , Rfl:    doxazosin (CARDURA) 8 MG tablet, TAKE (1) TABLET DAILY FOR HIGH BLOOD PRESSURE., Disp: 90 tablet, Rfl: 2   EPIPEN 2-PAK 0.3 MG/0.3ML SOAJ injection, Inject 0.3 mg into the muscle as needed for anaphylaxis. Reported on  01/24/2016, Disp: , Rfl:    escitalopram (LEXAPRO) 20 MG tablet, Take 1 tablet (20 mg total) by mouth daily., Disp: 90 tablet, Rfl: 0   glucose blood (ACCU-CHEK AVIVA PLUS) test strip, CHECK BLOOD SUGAR ONCE DAILY OR AS DIRECTED Dx E11.40, Disp: 100 strip, Rfl: 3   ipratropium-albuterol (DUONEB) 0.5-2.5 (3) MG/3ML SOLN, Take 3 mLs by nebulization 2 (two) times daily., Disp: 125 mL, Rfl: 0   ketoconazole (NIZORAL) 2 % cream, Apply 1 application topically daily., Disp: 30 g, Rfl: 0   levothyroxine (SYNTHROID) 75 MCG tablet, Take 1 tablet (75 mcg total) by mouth daily before breakfast. Patient needs to be seen for any further refills, Disp: 90 tablet, Rfl: 0   lisinopril (ZESTRIL) 40 MG tablet, Take 1 tablet (40 mg total) by mouth daily., Disp: 90 tablet, Rfl: 1   loratadine (CLARITIN) 10 MG tablet, Take 1 tablet by mouth daily., Disp: , Rfl:    meclizine (ANTIVERT) 25 MG tablet, Take 1 tablet (25 mg total) by mouth 3 (three) times daily as needed for dizziness., Disp: 30 tablet, Rfl: 0   metFORMIN (GLUCOPHAGE) 1000 MG tablet, TAKE  (1)  TABLET TWICE A DAY WITH MEALS (BREAKFAST AND SUPPER), Disp: 180 tablet, Rfl: 3   metoprolol succinate (TOPROL-XL) 100 MG 24 hr tablet, TAKE ONE TABLET ONCE DAILY (NEEDS TO BE SEEN BEFORE NEXT REFILL), Disp: 90 tablet, Rfl: 1   montelukast (SINGULAIR) 10 MG tablet, Take 1 tablet (10 mg total) by mouth at bedtime., Disp: 30 tablet, Rfl: 5   neomycin-polymyxin b-dexamethasone (MAXITROL) 3.5-10000-0.1 OINT, , Disp: , Rfl:    rosuvastatin (CRESTOR) 20 MG tablet, Take 1 tablet (20 mg total) by mouth at bedtime., Disp: 90 tablet, Rfl: 3   sitaGLIPtin (JANUVIA) 50 MG tablet, Take 1 tablet (50 mg total) by mouth daily., Disp: 90 tablet, Rfl: 0  Allergies  Allergen Reactions   Dimetapp C [Phenylephrine-Bromphen-Codeine] Itching   Phenylephrine Hcl Itching   Shingrix [Zoster Vac Recomb Adjuvanted] Other (See Comments)    Swelling and fever    Ultram [Tramadol Hcl] Other (See  Comments)    Insomnia   Celebrex [Celecoxib] Rash   Cortisone Rash   Fenofibrate Rash   Sulfa Antibiotics Rash    Objective:   There were no vitals taken for this visit.  Patient is well-developed, well-nourished in no acute distress.  Resting comfortably at home.  Head is normocephalic, atraumatic.  No labored breathing.  Speech is clear and coherent with logical content.  Patient is alert, at baseline.  Red raised areas scattered to bilateral arms. No surrounding erythema.   Assessment and Plan:   Sander was seen today for rash.  Diagnoses and all orders for this visit:  Skin irritation Places to arms look like irritated insect bites. Will treat with below. Pt aware to report new, worsening, or persistent symptoms.  -     triamcinolone cream (KENALOG) 0.1 %; Apply 1 Application topically 2 (two) times daily.      Return if symptoms worsen or fail to improve.  Kari Baars, FNP-C [facility] [address] [place], [place] [address] [phone]  01/09/2023  Time spent with the patient: 15 minutes, of which >50% was spent in obtaining information about symptoms, reviewing previous labs, evaluations, and treatments, counseling about condition (please see the discussed topics above), and developing a plan to further investigate it; had a number of questions which I addressed.

## 2023-01-16 DIAGNOSIS — J301 Allergic rhinitis due to pollen: Secondary | ICD-10-CM | POA: Diagnosis not present

## 2023-01-16 DIAGNOSIS — J3081 Allergic rhinitis due to animal (cat) (dog) hair and dander: Secondary | ICD-10-CM | POA: Diagnosis not present

## 2023-01-16 DIAGNOSIS — J3089 Other allergic rhinitis: Secondary | ICD-10-CM | POA: Diagnosis not present

## 2023-01-24 DIAGNOSIS — J3089 Other allergic rhinitis: Secondary | ICD-10-CM | POA: Diagnosis not present

## 2023-01-24 DIAGNOSIS — J3081 Allergic rhinitis due to animal (cat) (dog) hair and dander: Secondary | ICD-10-CM | POA: Diagnosis not present

## 2023-01-24 DIAGNOSIS — J301 Allergic rhinitis due to pollen: Secondary | ICD-10-CM | POA: Diagnosis not present

## 2023-01-29 DIAGNOSIS — J3081 Allergic rhinitis due to animal (cat) (dog) hair and dander: Secondary | ICD-10-CM | POA: Diagnosis not present

## 2023-01-29 DIAGNOSIS — J301 Allergic rhinitis due to pollen: Secondary | ICD-10-CM | POA: Diagnosis not present

## 2023-01-29 DIAGNOSIS — J3089 Other allergic rhinitis: Secondary | ICD-10-CM | POA: Diagnosis not present

## 2023-02-07 DIAGNOSIS — J301 Allergic rhinitis due to pollen: Secondary | ICD-10-CM | POA: Diagnosis not present

## 2023-02-07 DIAGNOSIS — J3081 Allergic rhinitis due to animal (cat) (dog) hair and dander: Secondary | ICD-10-CM | POA: Diagnosis not present

## 2023-02-07 DIAGNOSIS — J3089 Other allergic rhinitis: Secondary | ICD-10-CM | POA: Diagnosis not present

## 2023-02-14 DIAGNOSIS — J3081 Allergic rhinitis due to animal (cat) (dog) hair and dander: Secondary | ICD-10-CM | POA: Diagnosis not present

## 2023-02-14 DIAGNOSIS — J3089 Other allergic rhinitis: Secondary | ICD-10-CM | POA: Diagnosis not present

## 2023-02-14 DIAGNOSIS — J301 Allergic rhinitis due to pollen: Secondary | ICD-10-CM | POA: Diagnosis not present

## 2023-02-19 DIAGNOSIS — J3089 Other allergic rhinitis: Secondary | ICD-10-CM | POA: Diagnosis not present

## 2023-02-19 DIAGNOSIS — J3081 Allergic rhinitis due to animal (cat) (dog) hair and dander: Secondary | ICD-10-CM | POA: Diagnosis not present

## 2023-02-19 DIAGNOSIS — J301 Allergic rhinitis due to pollen: Secondary | ICD-10-CM | POA: Diagnosis not present

## 2023-03-07 DIAGNOSIS — J3081 Allergic rhinitis due to animal (cat) (dog) hair and dander: Secondary | ICD-10-CM | POA: Diagnosis not present

## 2023-03-07 DIAGNOSIS — J3089 Other allergic rhinitis: Secondary | ICD-10-CM | POA: Diagnosis not present

## 2023-03-07 DIAGNOSIS — J301 Allergic rhinitis due to pollen: Secondary | ICD-10-CM | POA: Diagnosis not present

## 2023-03-10 ENCOUNTER — Ambulatory Visit (INDEPENDENT_AMBULATORY_CARE_PROVIDER_SITE_OTHER): Payer: Medicare PPO

## 2023-03-10 VITALS — Ht 64.0 in | Wt 160.0 lb

## 2023-03-10 DIAGNOSIS — Z0000 Encounter for general adult medical examination without abnormal findings: Secondary | ICD-10-CM

## 2023-03-10 NOTE — Progress Notes (Signed)
Subjective:   Kristi Willis is a 82 y.o. female who presents for Medicare Annual (Subsequent) preventive examination.  Visit Complete: Virtual  I connected with  Kristi Willis on 03/10/23 by a audio enabled telemedicine application and verified that I am speaking with the correct person using two identifiers.  Patient Location: Home  Provider Location: Home Office  I discussed the limitations of evaluation and management by telemedicine. The patient expressed understanding and agreed to proceed.  Patient Medicare AWV questionnaire was completed by the patient on 03/10/2023; I have confirmed that all information answered by patient is correct and no changes since this date.  Review of Systems    Nutrition Risk Assessment:   Per patient no change in vitals since last visit, unable to obtain new vitals due to telehealth visit  Has the patient had any N/V/D within the last 2 months?  No  Does the patient have any non-healing wounds?  No  Has the patient had any unintentional weight loss or weight gain?  No   Diabetes:  Is the patient diabetic?  Yes  If diabetic, was a CBG obtained today?  No  Did the patient bring in their glucometer from home?  No  How often do you monitor your CBG's? Never .   Financial Strains and Diabetes Management:  Are you having any financial strains with the device, your supplies or your medication? No .  Does the patient want to be seen by Chronic Care Management for management of their diabetes?  No  Would the patient like to be referred to a Nutritionist or for Diabetic Management?  No   Diabetic Exams:  Diabetic Eye Exam: Completed 08/2022 Diabetic Foot Exam: Overdue, Pt has been advised about the importance in completing this exam. Pt is scheduled for diabetic foot exam on next office visit .  Cardiac Risk Factors include: advanced age (>75men, >74 women);diabetes mellitus;dyslipidemia;hypertension     Objective:    Today's  Vitals   03/10/23 1532  Weight: 160 lb (72.6 kg)  Height: 5\' 4"  (1.626 m)   Body mass index is 27.46 kg/m.     03/10/2023    3:36 PM 12/04/2022    3:01 PM 04/25/2022    2:58 PM 03/06/2022   12:39 PM 11/07/2021   10:05 AM 10/23/2021    1:18 PM 09/26/2021    2:32 PM  Advanced Directives  Does Patient Have a Medical Advance Directive? No No No No No No No  Would patient like information on creating a medical advance directive? Yes (MAU/Ambulatory/Procedural Areas - Information given) No - Patient declined  No - Patient declined   No - Patient declined    Current Medications (verified) Outpatient Encounter Medications as of 03/10/2023  Medication Sig   Accu-Chek Softclix Lancets lancets CHECK BLOOD SUGAR ONCE DAILY OR AS DIRECTED Dx E11.40   acetaminophen (TYLENOL) 325 MG tablet Take 2 tablets (650 mg total) by mouth every 6 (six) hours as needed for mild pain, fever or moderate pain.   acyclovir ointment (ZOVIRAX) 5 % APPLY TO THE AFFECTED AREA AROUND MOUTH EVERY 3 HOURS AS DIRECTED UNTIL CLEAR   albuterol (PROVENTIL) (2.5 MG/3ML) 0.083% nebulizer solution Take 3 mLs (2.5 mg total) by nebulization every 6 (six) hours as needed for wheezing or shortness of breath.   albuterol (VENTOLIN HFA) 108 (90 Base) MCG/ACT inhaler Inhale 2 puffs into the lungs every 6 (six) hours as needed for wheezing or shortness of breath.   amLODipine (NORVASC) 5 MG  tablet Take 1 tablet (5 mg total) by mouth daily.   aspirin 81 MG chewable tablet Chew 81 mg by mouth at bedtime.   azelastine (ASTELIN) 0.1 % nasal spray Place 2 sprays into both nostrils 2 (two) times daily.   Blood Glucose Monitoring Suppl (ACCU-CHEK AVIVA PLUS) w/Device KIT Check sugars daily & as needed Dx E11.9   cetirizine (ZYRTEC) 10 MG tablet Take 1 tablet (10 mg total) by mouth daily.   Coenzyme Q10 (COQ10 PO) Take 1 tablet by mouth daily.   doxazosin (CARDURA) 8 MG tablet TAKE (1) TABLET DAILY FOR HIGH BLOOD PRESSURE.   EPIPEN 2-PAK 0.3 MG/0.3ML  SOAJ injection Inject 0.3 mg into the muscle as needed for anaphylaxis. Reported on 01/24/2016   escitalopram (LEXAPRO) 20 MG tablet Take 1 tablet (20 mg total) by mouth daily.   glucose blood (ACCU-CHEK AVIVA PLUS) test strip CHECK BLOOD SUGAR ONCE DAILY OR AS DIRECTED Dx E11.40   ipratropium-albuterol (DUONEB) 0.5-2.5 (3) MG/3ML SOLN Take 3 mLs by nebulization 2 (two) times daily.   ketoconazole (NIZORAL) 2 % cream Apply 1 application topically daily.   levothyroxine (SYNTHROID) 75 MCG tablet Take 1 tablet (75 mcg total) by mouth daily before breakfast. Patient needs to be seen for any further refills   lisinopril (ZESTRIL) 40 MG tablet Take 1 tablet (40 mg total) by mouth daily.   loratadine (CLARITIN) 10 MG tablet Take 1 tablet by mouth daily.   meclizine (ANTIVERT) 25 MG tablet Take 1 tablet (25 mg total) by mouth 3 (three) times daily as needed for dizziness.   metFORMIN (GLUCOPHAGE) 1000 MG tablet TAKE  (1)  TABLET TWICE A DAY WITH MEALS (BREAKFAST AND SUPPER)   metoprolol succinate (TOPROL-XL) 100 MG 24 hr tablet TAKE ONE TABLET ONCE DAILY (NEEDS TO BE SEEN BEFORE NEXT REFILL)   montelukast (SINGULAIR) 10 MG tablet Take 1 tablet (10 mg total) by mouth at bedtime.   neomycin-polymyxin b-dexamethasone (MAXITROL) 3.5-10000-0.1 OINT    rosuvastatin (CRESTOR) 20 MG tablet Take 1 tablet (20 mg total) by mouth at bedtime.   sitaGLIPtin (JANUVIA) 50 MG tablet Take 1 tablet (50 mg total) by mouth daily.   triamcinolone cream (KENALOG) 0.1 % Apply 1 Application topically 2 (two) times daily.   No facility-administered encounter medications on file as of 03/10/2023.    Allergies (verified) Dimetapp c [phenylephrine-bromphen-codeine], Phenylephrine hcl, Shingrix [zoster vac recomb adjuvanted], Ultram [tramadol hcl], Celebrex [celecoxib], Cortisone, Fenofibrate, and Sulfa antibiotics   History: Past Medical History:  Diagnosis Date   Allergy    Asthma    Cancer (HCC)    skin   Diabetes mellitus     GERD (gastroesophageal reflux disease)    hiatal hernia   Hypercholesteremia    Hypertension    Neuromuscular disorder (HCC)    DM neuropathy   Neuropathy    SVT (supraventricular tachycardia)    Past Surgical History:  Procedure Laterality Date   BREAST BIOPSY     left breast biopsy   LUMBAR LAMINECTOMY  08/10   SKIN CANCER EXCISION  2005   SPINE SURGERY     lumbar laminectomy   Family History  Problem Relation Age of Onset   Cancer Mother 86       cervical cancer   Depression Mother    Heart disease Father    Stroke Father    Cancer Father        lung cancer   Heart disease Sister 7       MI  Hypertension Sister    Cancer Sister 76       breast cancer   Healthy Daughter    Social History   Socioeconomic History   Marital status: Widowed    Spouse name: Not on file   Number of children: 1   Years of education: Not on file   Highest education level: 12th grade  Occupational History   Occupation: Retired from [employer]    Comment: retired  Tobacco Use   Smoking status: Never   Smokeless tobacco: Never  Vaping Use   Vaping status: Never Used  Substance and Sexual Activity   Alcohol use: No   Drug use: No   Sexual activity: Not Currently  Other Topics Concern   Not on file  Social History Narrative   Lives home alone - daughter lives about 5 miles away.   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   Social Determinants of Health   Financial Resource Strain: Low Risk  (03/10/2023)   Overall Financial Resource Strain (CARDIA)    Difficulty of Paying Living Expenses: Not hard at all  Food Insecurity: No Food Insecurity (03/10/2023)   Hunger Vital Sign    Worried About Running Out of Food in the Last Year:  Never true    Ran Out of Food in the Last Year: Never true  Transportation Needs: No Transportation Needs (03/10/2023)   PRAPARE - Administrator, Civil Service (Medical): No    Lack of Transportation (Non-Medical): No  Physical Activity: Inactive (03/10/2023)   Exercise Vital Sign    Days of Exercise per Week: 0 days    Minutes of Exercise per Session: 0 min  Stress: No Stress Concern Present (03/10/2023)   [employer] of Occupational Health - Occupational Stress Questionnaire    Feeling of Stress : Not at all  Social Connections: Socially Isolated (03/10/2023)   Social Connection and Isolation Panel [NHANES]    Frequency of Communication with Friends and Family: More than three times a week    Frequency of Social Gatherings with Friends and Family: More than three times a week    Attends Religious Services: Never    Database administrator or Organizations: No    Attends Banker Meetings: Never    Marital Status: Widowed    Tobacco Counseling Counseling given: Not Answered   Clinical Intake:  Pre-visit preparation completed: Yes  Pain : No/denies pain     Nutritional Risks: None Diabetes: Yes CBG done?: No Did pt. bring in CBG monitor from home?: No  How often do you need to have someone help you when you read instructions, pamphlets, or other written materials from your doctor or pharmacy?: 1 - Never  Interpreter Needed?: No  Information entered by :: Renie Ora, LPN   Activities of Daily Living    03/10/2023    3:37 PM  In your present state of health, do you have any difficulty performing the following activities:  Hearing? 0  Vision? 0  Difficulty concentrating or making decisions? 0  Walking or climbing stairs? 0  Dressing or bathing? 0  Doing errands, shopping? 0  Preparing Food and eating ? N  Using the Toilet? N  In the past six months, have  you accidently leaked urine? N  Do you have problems with loss of bowel control?  N  Managing your Medications? N  Managing your Finances? N  Housekeeping or managing your Housekeeping? N    Patient Care Team: Junie Spencer, FNP as PCP - General (Family Medicine) Little Ishikawa, MD as PCP - Cardiology (Cardiology) Mateo Flow, MD as Consulting Physician (Ophthalmology) Van Clines, MD as Consulting Physician (Neurology) Gwynneth Munson Corrie Dandy (Neurology) Holli Humbles, MD as Referring Physician (Ophthalmology) Sidney Ace, MD as Referring Physician (Allergy) Little Ishikawa, MD as Consulting Physician (Cardiology) Adam Phenix, DPM as Consulting Physician (Podiatry)  Indicate any recent Medical Services you may have received from other than [facility] providers in the past year (date may be approximate).     Assessment:   This is a routine wellness examination for [place].  Hearing/Vision screen Vision Screening - Comments:: Wears rx glasses - up to date with routine eye exams with  Dr.hecker   Dietary issues and exercise activities discussed:     Goals Addressed             This Visit's Progress    DIET - INCREASE WATER INTAKE         Depression Screen    03/10/2023    3:35 PM 07/04/2022    2:37 PM 07/01/2022    2:48 PM 03/06/2022   12:38 PM 01/23/2022   11:20 AM 11/16/2021   10:28 AM 10/05/2021    2:52 PM  PHQ 2/9 Scores  PHQ - 2 Score 0 0 0 0  0 0  PHQ- 9 Score  0 0      Exception Documentation     Patient refusal      Fall Risk    03/10/2023    3:33 PM 12/04/2022    3:01 PM 07/04/2022    2:49 PM 07/04/2022    2:36 PM 07/01/2022    2:24 PM  Fall Risk   Falls in the past year? 0 0 0 0 0  Number falls in past yr: 0 0     Injury with Fall? 0 0     Risk for fall due to : No Fall Risks      Follow up Falls prevention discussed Falls evaluation completed       MEDICARE RISK AT HOME:  Medicare Risk at Home - 03/10/23 1533     Any stairs in or around the home? Yes    If so, are there any without  handrails? No    Home free of loose throw rugs in walkways, pet beds, electrical cords, etc? Yes    Adequate lighting in your home to reduce risk of falls? Yes    Life alert? No    Use of a cane, walker or w/c? No    Grab bars in the bathroom? Yes    Shower chair or bench in shower? Yes    Elevated toilet seat or a handicapped toilet? Yes             TIMED UP AND GO:  Was the test performed?  No    Cognitive Function:    03/06/2022   12:42 PM 09/17/2017    4:00 PM 06/05/2016   11:22 AM 12/22/2014   10:41 AM  MMSE - Mini Mental State Exam  Not completed: Unable to complete;Refused     Orientation to time  5 5 5   Orientation to Place  5 5 5   Registration  3 3 3  Attention/ Calculation  5 5 5   Recall  3 3 3   Language- name 2 objects  2 2 2   Language- repeat  0 0 0  Language- follow 3 step command  3 3 3   Language- read & follow direction  1 1 1   Write a sentence  1 1 1   Copy design  1 1 1   Total score  29 29 29       06/10/2016    1:00 PM  Montreal Cognitive Assessment   Visuospatial/ Executive (0/5) 5  Naming (0/3) 3  Attention: Read list of digits (0/2) 1  Attention: Read list of letters (0/1) 0  Attention: Serial 7 subtraction starting at 100 (0/3) 1  Language: Repeat phrase (0/2) 1  Language : Fluency (0/1) 0  Abstraction (0/2) 2  Delayed Recall (0/5) 5  Orientation (0/6) 5  Total 23  Adjusted Score (based on education) 24      03/10/2023    3:37 PM 02/24/2020   12:14 PM 02/17/2019    1:42 PM  6CIT Screen  What Year? 0 points 0 points 0 points  What month? 0 points 0 points 0 points  What time? 0 points 0 points 0 points  Count back from 20 0 points 0 points 0 points  Months in reverse 0 points 2 points 0 points  Repeat phrase 0 points 4 points 0 points  Total Score 0 points 6 points 0 points    Immunizations Immunization History  Administered Date(s) Administered   Fluad Quad(high Dose 65+) 06/29/2019, 06/11/2021, 06/20/2022   Influenza, High Dose  Seasonal PF 08/29/2014, 06/13/2016, 07/03/2017, 08/06/2017, 06/16/2018, 07/15/2018, 06/15/2019, 07/19/2020, 08/21/2021   Influenza,inj,Quad PF,6+ Mos 05/20/2013, 05/31/2014, 07/18/2015   Influenza-Unspecified 08/20/2011   Moderna Sars-Covid-2 Vaccination 09/23/2019, 10/22/2019, 07/18/2020   PFIZER(Purple Top)SARS-COV-2 Vaccination 07/19/2020   Pneumococcal Conjugate-13 05/31/2014   Pneumococcal Polysaccharide-23 07/22/2012, 06/14/2015, 03/04/2016, 11/22/2016, 08/06/2017, 07/15/2018, 06/15/2019, 08/21/2021   Respiratory Syncytial Virus Vaccine,Recomb Aduvanted(Arexvy) 06/24/2022   Tdap 02/02/2015   Zoster Recombinant(Shingrix) 03/06/2021    TDAP status: Up to date  Flu Vaccine status: Up to date  Pneumococcal vaccine status: Up to date  Covid-19 vaccine status: Completed vaccines  Qualifies for Shingles Vaccine? Yes   Zostavax completed Yes   Shingrix Completed?: Yes  Screening Tests Health Maintenance  Topic Date Due   Zoster Vaccines- Shingrix (2 of 2) 05/01/2021   COVID-19 Vaccine (5 - 2023-24 season) 04/19/2022   FOOT EXAM  04/24/2022   OPHTHALMOLOGY EXAM  05/16/2022   INFLUENZA VACCINE  03/20/2023   HEMOGLOBIN A1C  05/15/2023   Diabetic kidney evaluation - eGFR measurement  11/12/2023   Diabetic kidney evaluation - Urine ACR  11/12/2023   Medicare Annual Wellness (AWV)  03/09/2024   DEXA SCAN  04/27/2024   DTaP/Tdap/Td (2 - Td or Tdap) 02/01/2025   Pneumonia Vaccine 67+ Years old  Completed   HPV VACCINES  Aged Out   Hepatitis C Screening  Discontinued    Health Maintenance  Health Maintenance Due  Topic Date Due   Zoster Vaccines- Shingrix (2 of 2) 05/01/2021   COVID-19 Vaccine (5 - 2023-24 season) 04/19/2022   FOOT EXAM  04/24/2022   OPHTHALMOLOGY EXAM  05/16/2022    Colorectal cancer screening: No longer required.   Mammogram status: No longer required due to age.  Bone Density status: Completed 04/27/2021. Results reflect: Bone density results:  OSTEOPENIA. Repeat every 5 years.  Lung Cancer Screening: (Low Dose CT Chest recommended if Age 33-80 years, 20 pack-year currently smoking  OR have quit w/in 15years.) does not qualify.   Lung Cancer Screening Referral: n/a  Additional Screening:  Hepatitis C Screening: does not qualify;  Vision Screening: Recommended annual ophthalmology exams for early detection of glaucoma and other disorders of the eye. Is the patient up to date with their annual eye exam?  Yes  Who is the provider or what is the name of the office in which the patient attends annual eye exams? Dr.Hecker  If pt is not established with a provider, would they like to be referred to a provider to establish care? No .   Dental Screening: Recommended annual dental exams for proper oral hygiene    Community Resource Referral / Chronic Care Management: CRR required this visit?  No   CCM required this visit?  No     Plan:     I have personally reviewed and noted the following in the patient's chart:   Medical and social history Use of alcohol, tobacco or illicit drugs  Current medications and supplements including opioid prescriptions. Patient is not currently taking opioid prescriptions. Functional ability and status Nutritional status Physical activity Advanced directives List of other physicians Hospitalizations, surgeries, and ER visits in previous 12 months Vitals Screenings to include cognitive, depression, and falls Referrals and appointments  In addition, I have reviewed and discussed with patient certain preventive protocols, quality metrics, and best practice recommendations. A written personalized care plan for preventive services as well as general preventive health recommendations were provided to patient.     Lorrene Reid, LPN   04/16/5620   After Visit Summary: (MyChart) Due to this being a telephonic visit, the after visit summary with patients personalized plan was offered to patient  via MyChart   Nurse Notes: Daughter penny assisted with MWV

## 2023-03-10 NOTE — Patient Instructions (Signed)
[name] , Thank you for taking time to come for your Medicare Wellness Visit. I appreciate your ongoing commitment to your health goals. Please review the following plan we discussed and let me know if I can assist you in the future.   These are the goals we discussed:  Goals      DIET - INCREASE WATER INTAKE     Plan meals     Try to eat vegetables, lean proteins, and limit amount of fried foods      Prevent falls        This is a list of the screening recommended for you and due dates:  Health Maintenance  Topic Date Due   Zoster (Shingles) Vaccine (2 of 2) 05/01/2021   COVID-19 Vaccine (5 - 2023-24 season) 04/19/2022   Complete foot exam   04/24/2022   Eye exam for diabetics  05/16/2022   Flu Shot  03/20/2023   Hemoglobin A1C  05/15/2023   Yearly kidney function blood test for diabetes  11/12/2023   Yearly kidney health urinalysis for diabetes  11/12/2023   Medicare Annual Wellness Visit  03/09/2024   DEXA scan (bone density measurement)  04/27/2024   DTaP/Tdap/Td vaccine (2 - Td or Tdap) 02/01/2025   Pneumonia Vaccine  Completed   HPV Vaccine  Aged Out   Hepatitis C Screening  Discontinued    Advanced directives: Advance directive discussed with you today. I have provided a copy for you to complete at home and have notarized. Once this is complete please bring a copy in to our office so we can scan it into your chart. Information on Advanced Care Planning can be found at [facility] Advance Health Care Directives Advance Health Care Directives ([email or web address]    Conditions/risks identified: Aim for 30 minutes of exercise or brisk walking, 6-8 glasses of water, and 5 servings of fruits and vegetables each day.   Next appointment: Follow up in one year for your annual wellness visit    Preventive Care 65 Years and Older, Female Preventive care refers to lifestyle choices and visits with your health care provider that can promote health and  wellness. What does preventive care include? A yearly physical exam. This is also called an annual well check. Dental exams once or twice a year. Routine eye exams. Ask your health care provider how often you should have your eyes checked. Personal lifestyle choices, including: Daily care of your teeth and gums. Regular physical activity. Eating a healthy diet. Avoiding tobacco and drug use. Limiting alcohol use. Practicing safe sex. Taking low-dose aspirin every day. Taking vitamin and mineral supplements as recommended by your health care provider. What happens during an annual well check? The services and screenings done by your health care provider during your annual well check will depend on your age, overall health, lifestyle risk factors, and family history of disease. Counseling  Your health care provider may ask you questions about your: Alcohol use. Tobacco use. Drug use. Emotional well-being. Home and relationship well-being. Sexual activity. Eating habits. History of falls. Memory and ability to understand (cognition). Work and work Astronomer. Reproductive health. Screening  You may have the following tests or measurements: Height, weight, and BMI. Blood pressure. Lipid and cholesterol levels. These may be checked every 5 years, or more frequently if you are over 62 years old. Skin check. Lung cancer screening. You may have this screening every year starting at age 55 if you have a 30-pack-year history of smoking and  currently smoke or have quit within the past 15 years. Fecal occult blood test (FOBT) of the stool. You may have this test every year starting at age 58. Flexible sigmoidoscopy or colonoscopy. You may have a sigmoidoscopy every 5 years or a colonoscopy every 10 years starting at age 13. Hepatitis C blood test. Hepatitis B blood test. Sexually transmitted disease (STD) testing. Diabetes screening. This is done by checking your blood sugar (glucose)  after you have not eaten for a while (fasting). You may have this done every 1-3 years. Bone density scan. This is done to screen for osteoporosis. You may have this done starting at age 91. Mammogram. This may be done every 1-2 years. Talk to your health care provider about how often you should have regular mammograms. Talk with your health care provider about your test results, treatment options, and if necessary, the need for more tests. Vaccines  Your health care provider may recommend certain vaccines, such as: Influenza vaccine. This is recommended every year. Tetanus, diphtheria, and acellular pertussis (Tdap, Td) vaccine. You may need a Td booster every 10 years. Zoster vaccine. You may need this after age 11. Pneumococcal 13-valent conjugate (PCV13) vaccine. One dose is recommended after age 56. Pneumococcal polysaccharide (PPSV23) vaccine. One dose is recommended after age 22. Talk to your health care provider about which screenings and vaccines you need and how often you need them. This information is not intended to replace advice given to you by your health care provider. Make sure you discuss any questions you have with your health care provider. Document Released: 09/01/2015 Document Revised: 04/24/2016 Document Reviewed: 06/06/2015 Elsevier Interactive Patient Education  2017 [employer].  Fall Prevention in the Home Falls can cause injuries. They can happen to people of all ages. There are many things you can do to make your home safe and to help prevent falls. What can I do on the outside of my home? Regularly fix the edges of walkways and driveways and fix any cracks. Remove anything that might make you trip as you walk through a door, such as a raised step or threshold. Trim any bushes or trees on the path to your home. Use bright outdoor lighting. Clear any walking paths of anything that might make someone trip, such as rocks or tools. Regularly check to see if  handrails are loose or broken. Make sure that both sides of any steps have handrails. Any raised decks and porches should have guardrails on the edges. Have any leaves, snow, or ice cleared regularly. Use sand or salt on walking paths during winter. Clean up any spills in your garage right away. This includes oil or grease spills. What can I do in the bathroom? Use night lights. Install grab bars by the toilet and in the tub and shower. Do not use towel bars as grab bars. Use non-skid mats or decals in the tub or shower. If you need to sit down in the shower, use a plastic, non-slip stool. Keep the floor dry. Clean up any water that spills on the floor as soon as it happens. Remove soap buildup in the tub or shower regularly. Attach bath mats securely with double-sided non-slip rug tape. Do not have throw rugs and other things on the floor that can make you trip. What can I do in the bedroom? Use night lights. Make sure that you have a light by your bed that is easy to reach. Do not use any sheets or blankets that are too  big for your bed. They should not hang down onto the floor. Have a firm chair that has side arms. You can use this for support while you get dressed. Do not have throw rugs and other things on the floor that can make you trip. What can I do in the kitchen? Clean up any spills right away. Avoid walking on wet floors. Keep items that you use a lot in easy-to-reach places. If you need to reach something above you, use a strong step stool that has a grab bar. Keep electrical cords out of the way. Do not use floor polish or wax that makes floors slippery. If you must use wax, use non-skid floor wax. Do not have throw rugs and other things on the floor that can make you trip. What can I do with my stairs? Do not leave any items on the stairs. Make sure that there are handrails on both sides of the stairs and use them. Fix handrails that are broken or loose. Make sure that  handrails are as long as the stairways. Check any carpeting to make sure that it is firmly attached to the stairs. Fix any carpet that is loose or worn. Avoid having throw rugs at the top or bottom of the stairs. If you do have throw rugs, attach them to the floor with carpet tape. Make sure that you have a light switch at the top of the stairs and the bottom of the stairs. If you do not have them, ask someone to add them for you. What else can I do to help prevent falls? Wear shoes that: Do not have high heels. Have rubber bottoms. Are comfortable and fit you well. Are closed at the toe. Do not wear sandals. If you use a stepladder: Make sure that it is fully opened. Do not climb a closed stepladder. Make sure that both sides of the stepladder are locked into place. Ask someone to hold it for you, if possible. Clearly mark and make sure that you can see: Any grab bars or handrails. First and last steps. Where the edge of each step is. Use tools that help you move around (mobility aids) if they are needed. These include: Canes. Walkers. Scooters. Crutches. Turn on the lights when you go into a dark area. Replace any light bulbs as soon as they burn out. Set up your furniture so you have a clear path. Avoid moving your furniture around. If any of your floors are uneven, fix them. If there are any pets around you, be aware of where they are. Review your medicines with your doctor. Some medicines can make you feel dizzy. This can increase your chance of falling. Ask your doctor what other things that you can do to help prevent falls. This information is not intended to replace advice given to you by your health care provider. Make sure you discuss any questions you have with your health care provider. Document Released: 06/01/2009 Document Revised: 01/11/2016 Document Reviewed: 09/09/2014 Elsevier Interactive Patient Education  2017 [employer].

## 2023-03-13 DIAGNOSIS — E1142 Type 2 diabetes mellitus with diabetic polyneuropathy: Secondary | ICD-10-CM | POA: Diagnosis not present

## 2023-03-13 DIAGNOSIS — M79676 Pain in unspecified toe(s): Secondary | ICD-10-CM | POA: Diagnosis not present

## 2023-03-13 DIAGNOSIS — L84 Corns and callosities: Secondary | ICD-10-CM | POA: Diagnosis not present

## 2023-03-13 DIAGNOSIS — B351 Tinea unguium: Secondary | ICD-10-CM | POA: Diagnosis not present

## 2023-03-14 DIAGNOSIS — J301 Allergic rhinitis due to pollen: Secondary | ICD-10-CM | POA: Diagnosis not present

## 2023-03-14 DIAGNOSIS — J3089 Other allergic rhinitis: Secondary | ICD-10-CM | POA: Diagnosis not present

## 2023-03-14 DIAGNOSIS — J3081 Allergic rhinitis due to animal (cat) (dog) hair and dander: Secondary | ICD-10-CM | POA: Diagnosis not present

## 2023-03-28 DIAGNOSIS — J301 Allergic rhinitis due to pollen: Secondary | ICD-10-CM | POA: Diagnosis not present

## 2023-03-28 DIAGNOSIS — J3081 Allergic rhinitis due to animal (cat) (dog) hair and dander: Secondary | ICD-10-CM | POA: Diagnosis not present

## 2023-03-28 DIAGNOSIS — J3089 Other allergic rhinitis: Secondary | ICD-10-CM | POA: Diagnosis not present

## 2023-04-02 DIAGNOSIS — J3089 Other allergic rhinitis: Secondary | ICD-10-CM | POA: Diagnosis not present

## 2023-04-02 DIAGNOSIS — J3081 Allergic rhinitis due to animal (cat) (dog) hair and dander: Secondary | ICD-10-CM | POA: Diagnosis not present

## 2023-04-02 DIAGNOSIS — J301 Allergic rhinitis due to pollen: Secondary | ICD-10-CM | POA: Diagnosis not present

## 2023-04-09 ENCOUNTER — Other Ambulatory Visit: Payer: Self-pay | Admitting: Family

## 2023-04-09 DIAGNOSIS — R051 Acute cough: Secondary | ICD-10-CM

## 2023-04-10 DIAGNOSIS — J3081 Allergic rhinitis due to animal (cat) (dog) hair and dander: Secondary | ICD-10-CM | POA: Diagnosis not present

## 2023-04-10 DIAGNOSIS — J3089 Other allergic rhinitis: Secondary | ICD-10-CM | POA: Diagnosis not present

## 2023-04-10 DIAGNOSIS — J301 Allergic rhinitis due to pollen: Secondary | ICD-10-CM | POA: Diagnosis not present

## 2023-04-17 ENCOUNTER — Ambulatory Visit: Payer: Medicare PPO | Admitting: Cardiology

## 2023-04-23 ENCOUNTER — Other Ambulatory Visit: Payer: Self-pay | Admitting: Family

## 2023-04-23 DIAGNOSIS — F411 Generalized anxiety disorder: Secondary | ICD-10-CM

## 2023-04-23 DIAGNOSIS — F331 Major depressive disorder, recurrent, moderate: Secondary | ICD-10-CM

## 2023-04-23 DIAGNOSIS — E1142 Type 2 diabetes mellitus with diabetic polyneuropathy: Secondary | ICD-10-CM

## 2023-04-23 DIAGNOSIS — I10 Essential (primary) hypertension: Secondary | ICD-10-CM

## 2023-04-25 DIAGNOSIS — J3089 Other allergic rhinitis: Secondary | ICD-10-CM | POA: Diagnosis not present

## 2023-04-25 DIAGNOSIS — J301 Allergic rhinitis due to pollen: Secondary | ICD-10-CM | POA: Diagnosis not present

## 2023-04-25 DIAGNOSIS — J3081 Allergic rhinitis due to animal (cat) (dog) hair and dander: Secondary | ICD-10-CM | POA: Diagnosis not present

## 2023-05-06 DIAGNOSIS — J301 Allergic rhinitis due to pollen: Secondary | ICD-10-CM | POA: Diagnosis not present

## 2023-05-06 DIAGNOSIS — J3089 Other allergic rhinitis: Secondary | ICD-10-CM | POA: Diagnosis not present

## 2023-05-06 DIAGNOSIS — J3081 Allergic rhinitis due to animal (cat) (dog) hair and dander: Secondary | ICD-10-CM | POA: Diagnosis not present

## 2023-05-13 DIAGNOSIS — J3081 Allergic rhinitis due to animal (cat) (dog) hair and dander: Secondary | ICD-10-CM | POA: Diagnosis not present

## 2023-05-13 DIAGNOSIS — J45909 Unspecified asthma, uncomplicated: Secondary | ICD-10-CM | POA: Diagnosis not present

## 2023-05-13 DIAGNOSIS — F324 Major depressive disorder, single episode, in partial remission: Secondary | ICD-10-CM | POA: Diagnosis not present

## 2023-05-13 DIAGNOSIS — J3089 Other allergic rhinitis: Secondary | ICD-10-CM | POA: Diagnosis not present

## 2023-05-13 DIAGNOSIS — J301 Allergic rhinitis due to pollen: Secondary | ICD-10-CM | POA: Diagnosis not present

## 2023-05-13 DIAGNOSIS — N182 Chronic kidney disease, stage 2 (mild): Secondary | ICD-10-CM | POA: Diagnosis not present

## 2023-05-13 DIAGNOSIS — E039 Hypothyroidism, unspecified: Secondary | ICD-10-CM | POA: Diagnosis not present

## 2023-05-13 DIAGNOSIS — E785 Hyperlipidemia, unspecified: Secondary | ICD-10-CM | POA: Diagnosis not present

## 2023-05-13 DIAGNOSIS — E1142 Type 2 diabetes mellitus with diabetic polyneuropathy: Secondary | ICD-10-CM | POA: Diagnosis not present

## 2023-05-13 DIAGNOSIS — I471 Supraventricular tachycardia, unspecified: Secondary | ICD-10-CM | POA: Diagnosis not present

## 2023-05-13 DIAGNOSIS — R269 Unspecified abnormalities of gait and mobility: Secondary | ICD-10-CM | POA: Diagnosis not present

## 2023-05-13 DIAGNOSIS — F411 Generalized anxiety disorder: Secondary | ICD-10-CM | POA: Diagnosis not present

## 2023-05-21 ENCOUNTER — Other Ambulatory Visit: Payer: Self-pay | Admitting: Family

## 2023-05-21 DIAGNOSIS — F331 Major depressive disorder, recurrent, moderate: Secondary | ICD-10-CM

## 2023-05-21 DIAGNOSIS — E1142 Type 2 diabetes mellitus with diabetic polyneuropathy: Secondary | ICD-10-CM

## 2023-05-21 DIAGNOSIS — I10 Essential (primary) hypertension: Secondary | ICD-10-CM

## 2023-05-21 DIAGNOSIS — F411 Generalized anxiety disorder: Secondary | ICD-10-CM

## 2023-05-22 ENCOUNTER — Encounter: Payer: Self-pay | Admitting: Cardiology

## 2023-05-22 ENCOUNTER — Ambulatory Visit: Payer: Medicare PPO | Attending: Cardiology | Admitting: Cardiology

## 2023-05-22 VITALS — BP 140/50 | HR 57 | Ht 64.0 in | Wt 165.0 lb

## 2023-05-22 DIAGNOSIS — J3089 Other allergic rhinitis: Secondary | ICD-10-CM | POA: Diagnosis not present

## 2023-05-22 DIAGNOSIS — R600 Localized edema: Secondary | ICD-10-CM | POA: Diagnosis not present

## 2023-05-22 DIAGNOSIS — I10 Essential (primary) hypertension: Secondary | ICD-10-CM

## 2023-05-22 DIAGNOSIS — M79676 Pain in unspecified toe(s): Secondary | ICD-10-CM | POA: Diagnosis not present

## 2023-05-22 DIAGNOSIS — R079 Chest pain, unspecified: Secondary | ICD-10-CM | POA: Diagnosis not present

## 2023-05-22 DIAGNOSIS — E782 Mixed hyperlipidemia: Secondary | ICD-10-CM | POA: Diagnosis not present

## 2023-05-22 DIAGNOSIS — B351 Tinea unguium: Secondary | ICD-10-CM | POA: Diagnosis not present

## 2023-05-22 DIAGNOSIS — E1142 Type 2 diabetes mellitus with diabetic polyneuropathy: Secondary | ICD-10-CM | POA: Diagnosis not present

## 2023-05-22 DIAGNOSIS — J301 Allergic rhinitis due to pollen: Secondary | ICD-10-CM | POA: Diagnosis not present

## 2023-05-22 DIAGNOSIS — L84 Corns and callosities: Secondary | ICD-10-CM | POA: Diagnosis not present

## 2023-05-22 DIAGNOSIS — J3081 Allergic rhinitis due to animal (cat) (dog) hair and dander: Secondary | ICD-10-CM | POA: Diagnosis not present

## 2023-05-22 MED ORDER — ESCITALOPRAM OXALATE 20 MG PO TABS: 20.0000 mg | ORAL_TABLET | Freq: Every day | ORAL | 0 refills | Status: DC

## 2023-05-22 MED ORDER — AMLODIPINE BESYLATE 5 MG PO TABS: 5.0000 mg | ORAL_TABLET | Freq: Every day | ORAL | 0 refills | Status: DC

## 2023-05-22 MED ORDER — SITAGLIPTIN PHOSPHATE 50 MG PO TABS
50.0000 mg | ORAL_TABLET | Freq: Every day | ORAL | 0 refills | Status: DC
Start: 2023-05-22 — End: 2023-06-24

## 2023-05-22 NOTE — Addendum Note (Signed)
Addended by: Julious Payer D on: 05/22/2023 09:18 AM   Modules accepted: Orders

## 2023-05-22 NOTE — Progress Notes (Signed)
Cardiology Office Note:    Date:  05/22/2023   ID:  [name], DOB 11/21/1940, MRN [id number]  PCP:  Junie Spencer, FNP  Cardiologist:  Little Ishikawa, MD  Electrophysiologist:  None   Referring MD: Junie Spencer, FNP   Chief Complaint  Patient presents with   Chest Pain    History of Present Illness:    Kristi Willis is a 82 y.o. female with a hx of type 2 diabetes, hypertension, hyperlipidemia, atrial tachycardia, aphasia, asthma who presents for follow-up.  She was referred by Jannifer Rodney, FNP for evaluation of chest pain, initially seen on 07/28/2019.  History is partially provided by her daughter given patient's aphasia.  On 12/1, daughter reports that patient was walking outside and came inside reporting shortness of breath and heaviness in her chest.  She went to the ED. Unfortunately had long wait in the ED so left without being seen.  Troponins were drawn, which were negative x2.  Reports that she has had chest heaviness intermittently since that time.  Lasts a few minutes and resolves.  Also has been having lower extremity edema recently.  Never smoker.  Sister had MI around age 69.  Father had MI in 15s.  Previously followed with Dr. Graciela Husbands for atrial tachycardia.  Would feel like heart is racing.  Reports has been well controlled on Toprol-XL 100 mg twice daily.     TTE was done on 07/29/2019, which shows EF 60 to 65%, normal RV function, severe left atrial dilatation, no significant valvular disease.  Lower extremity duplex was done to evaluate lower extremity edema, which showed no evidence of DVT.  Lexiscan Myoview on 11/11/2019 showed small fixed mid anteroseptal/apical anterior/apex perfusion defect, likely representing artifact given normal wall motion in that area.   Since last clinic visit, patient's daughter reports things are going well.  Denies any chest pain, dyspnea, lightheadedness, syncope.  Does report some lower extremity  edema.    Past Medical History:  Diagnosis Date   Allergy    Asthma    Cancer (HCC)    skin   Diabetes mellitus    GERD (gastroesophageal reflux disease)    hiatal hernia   Hypercholesteremia    Hypertension    Neuromuscular disorder (HCC)    DM neuropathy   Neuropathy    SVT (supraventricular tachycardia) (HCC)     Past Surgical History:  Procedure Laterality Date   BREAST BIOPSY     left breast biopsy   LUMBAR LAMINECTOMY  08/10   SKIN CANCER EXCISION  2005   SPINE SURGERY     lumbar laminectomy    Current Medications: Current Meds  Medication Sig   Accu-Chek Softclix Lancets lancets CHECK BLOOD SUGAR ONCE DAILY OR AS DIRECTED Dx E11.40   acyclovir ointment (ZOVIRAX) 5 % APPLY TO THE AFFECTED AREA AROUND MOUTH EVERY 3 HOURS AS DIRECTED UNTIL CLEAR   amLODipine (NORVASC) 5 MG tablet Take 1 tablet (5 mg total) by mouth daily.   aspirin 81 MG chewable tablet Chew 81 mg by mouth at bedtime.   azelastine (ASTELIN) 0.1 % nasal spray Place 2 sprays into both nostrils 2 (two) times daily.   Blood Glucose Monitoring Suppl (ACCU-CHEK AVIVA PLUS) w/Device KIT Check sugars daily & as needed Dx E11.9   cetirizine (ZYRTEC) 10 MG tablet TAKE ONE TABLET DAILY   Coenzyme Q10 (COQ10 PO) Take 1 tablet by mouth daily.   doxazosin (CARDURA) 8 MG tablet TAKE (1) TABLET DAILY FOR HIGH  BLOOD PRESSURE.   escitalopram (LEXAPRO) 20 MG tablet Take 1 tablet (20 mg total) by mouth daily.   glucose blood (ACCU-CHEK AVIVA PLUS) test strip CHECK BLOOD SUGAR ONCE DAILY OR AS DIRECTED Dx E11.40   ipratropium-albuterol (DUONEB) 0.5-2.5 (3) MG/3ML SOLN Take 3 mLs by nebulization 2 (two) times daily.   ketoconazole (NIZORAL) 2 % cream Apply 1 application topically daily.   levothyroxine (SYNTHROID) 75 MCG tablet Take 1 tablet (75 mcg total) by mouth daily before breakfast. Patient needs to be seen for any further refills   lisinopril (ZESTRIL) 40 MG tablet Take 1 tablet (40 mg total) by mouth daily.    loratadine (CLARITIN) 10 MG tablet Take 1 tablet by mouth daily.   metFORMIN (GLUCOPHAGE) 1000 MG tablet TAKE  (1)  TABLET TWICE A DAY WITH MEALS (BREAKFAST AND SUPPER)   metoprolol succinate (TOPROL-XL) 100 MG 24 hr tablet TAKE ONE TABLET ONCE DAILY (NEEDS TO BE SEEN BEFORE NEXT REFILL)   montelukast (SINGULAIR) 10 MG tablet Take 1 tablet (10 mg total) by mouth at bedtime.   rosuvastatin (CRESTOR) 20 MG tablet Take 1 tablet (20 mg total) by mouth at bedtime.   sitaGLIPtin (JANUVIA) 50 MG tablet Take 1 tablet (50 mg total) by mouth daily.   triamcinolone cream (KENALOG) 0.1 % Apply 1 Application topically 2 (two) times daily.     Allergies:   Dimetapp c [phenylephrine-bromphen-codeine], Phenylephrine hcl, Shingrix [zoster vac recomb adjuvanted], Ultram [tramadol hcl], Celebrex [celecoxib], Cortisone, Fenofibrate, and Sulfa antibiotics   Social History   Socioeconomic History   Marital status: Widowed    Spouse name: Not on file   Number of children: 1   Years of education: Not on file   Highest education level: 12th grade  Occupational History   Occupation: Retired from [employer]    Comment: retired  Tobacco Use   Smoking status: Never   Smokeless tobacco: Never  Vaping Use   Vaping status: Never Used  Substance and Sexual Activity   Alcohol use: No   Drug use: No   Sexual activity: Not Currently  Other Topics Concern   Not on file  Social History Narrative   Lives home alone - daughter lives about 5 miles away.   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   Social Determinants of Health   Financial Resource Strain: Low Risk  (03/10/2023)   Overall Financial Resource Strain (CARDIA)    Difficulty of Paying Living Expenses: Not hard  at all  Food Insecurity: No Food Insecurity (03/10/2023)   Hunger Vital Sign    Worried About Running Out of Food in the Last Year: Never true    Ran Out of Food in the Last Year: Never true  Transportation Needs: No Transportation Needs (03/10/2023)   PRAPARE - Administrator, Civil Service (Medical): No    Lack of Transportation (Non-Medical): No  Physical Activity: Inactive (03/10/2023)   Exercise Vital Sign    Days of Exercise per Week: 0 days    Minutes of Exercise per Session: 0 min  Stress: No Stress Concern Present (03/10/2023)   [employer] of Occupational Health - Occupational Stress Questionnaire    Feeling of Stress : Not at all  Social Connections: Socially Isolated (03/10/2023)  Social Advertising account executive [NHANES]    Frequency of Communication with Friends and Family: More than three times a week    Frequency of Social Gatherings with Friends and Family: More than three times a week    Attends Religious Services: Never    Database administrator or Organizations: No    Attends Banker Meetings: Never    Marital Status: Widowed     Family History: The patient's family history includes Cancer in her father; Cancer (age of onset: 59) in her mother; Cancer (age of onset: 74) in her sister; Depression in her mother; Healthy in her daughter; Heart disease in her father; Heart disease (age of onset: 22) in her sister; Hypertension in her sister; Stroke in her father.  ROS:   Please see the history of present illness.     All other systems reviewed and are negative.  EKGs/Labs/Other Studies Reviewed:    The following studies were reviewed today:  EKG:  EKG is not ordered today.    Recent Labs: 11/12/2022: ALT 18; BUN 16; Creatinine, Ser 0.67; Hemoglobin 10.9; Platelets 266; Potassium 4.4; Sodium 143; TSH 0.680  Recent Lipid Panel    Component Value Date/Time   CHOL 131 11/12/2022 1231   CHOL 177 02/23/2013 0911   TRIG 189 (H)  11/12/2022 1231   TRIG 203 (H) 01/12/2015 0915   TRIG 293 (H) 02/23/2013 0911   HDL 56 11/12/2022 1231   HDL 40 01/12/2015 0915   HDL 48 02/23/2013 0911   CHOLHDL 2.3 11/12/2022 1231   LDLCALC 45 11/12/2022 1231   LDLCALC 119 (H) 10/06/2013 1012   LDLCALC 70 02/23/2013 0911    Physical Exam:    VS:  BP (!) 140/50 (BP Location: Left Arm, Patient Position: Sitting, Cuff Size: Large)   Pulse (!) 57   Ht 5\' 4"  (1.626 m)   Wt 165 lb (74.8 kg)   SpO2 95%   BMI 28.32 kg/m     Wt Readings from Last 3 Encounters:  05/22/23 165 lb (74.8 kg)  03/10/23 160 lb (72.6 kg)  11/12/22 164 lb (74.4 kg)     GEN:in no acute distress HEENT: Normal NECK: No JVD; No carotid bruits CARDIAC: RRR, no murmurs, rubs, gallops RESPIRATORY:  Clear to auscultation without rales, wheezing or rhonchi  ABDOMEN: Soft, non-tender, non-distended MUSCULOSKELETAL:  No LE edema SKIN: Warm and dry NEUROLOGIC:  Alert and oriented x 3 PSYCHIATRIC:  Normal affect   ASSESSMENT:    1. Chest pain, unspecified type   2. Lower extremity edema   3. Essential hypertension   4. Mixed hyperlipidemia     PLAN:    Chest pain: typical in description, as describes chest heaviness with exertion.  However, it is difficult to get a full history given patient's aphasia.    Lexiscan Myoview 10/2019 shows no evidence of ischemia.  Reports no recent chest pain, suspect may have been related to episode of pneumonia.  No further cardiac work-up recommended   Lower extremity edema: TTE on 07/29/2019 showed no evidence of structural heart disease.  No DVT on lower extremity duplex.  BNP 118.  Could be secondary to amlodipine use.  Prescribed as needed Lasix, reports has not been taking.  No edema on exam today   Hypertension: On amlodipine 5 mg daily, doxazosin 8 mg daily, lisinopril 40 mg daily, Toprol-XL 100 mg twice daily.    Appears controlled   Hyperlipidemia: On rosuvastatin 20 mg daily.  LDL 45 on 11/12/2022  Type 2  diabetes: On Januvia, Metformin.  A1c 7.8   RTC in 1 year  Medication Adjustments/Labs and Tests Ordered: Current medicines are reviewed at length with the patient today.  Concerns regarding medicines are outlined above.  No orders of the defined types were placed in this encounter.  No orders of the defined types were placed in this encounter.   Patient Instructions  Medication Instructions:   No changes  *If you need a refill on your cardiac medications before your next appointment, please call your pharmacy*   Lab Work:  Not needed  Testing/Procedures:  Not needed  Follow-Up: At [facility], you and your health needs are our priority.  As part of our continuing mission to provide you with exceptional heart care, we have created designated Provider Care Teams.  These Care Teams include your primary Cardiologist (physician) and Advanced Practice Providers (APPs -  Physician Assistants and Nurse Practitioners) who all work together to provide you with the care you need, when you need it.     Your next appointment:   12 month(s)  The format for your next appointment:   In Person  Provider:   Little Ishikawa, MD       Signed, Little Ishikawa, MD  05/22/2023 5:45 PM    [facility]

## 2023-05-22 NOTE — Telephone Encounter (Signed)
[facility] pt NTBS 30-d given 04/23/23

## 2023-05-22 NOTE — Patient Instructions (Signed)
Medication Instructions:  No changes  *If you need a refill on your cardiac medications before your next appointment, please call your pharmacy*   Lab Work: Not needed   Testing/Procedures:  Not needed  Follow-Up: At [facility], you and your health needs are our priority.  As part of our continuing mission to provide you with exceptional heart care, we have created designated Provider Care Teams.  These Care Teams include your primary Cardiologist (physician) and Advanced Practice Providers (APPs -  Physician Assistants and Nurse Practitioners) who all work together to provide you with the care you need, when you need it.     Your next appointment:   12 month(s)  The format for your next appointment:   In Person  Provider:   Donato Heinz, MD

## 2023-05-22 NOTE — Telephone Encounter (Signed)
Appt scheduled 06/12/2023, pt is about to run out of meds please send refill to pharmacy

## 2023-05-30 DIAGNOSIS — J3089 Other allergic rhinitis: Secondary | ICD-10-CM | POA: Diagnosis not present

## 2023-05-30 DIAGNOSIS — J301 Allergic rhinitis due to pollen: Secondary | ICD-10-CM | POA: Diagnosis not present

## 2023-05-30 DIAGNOSIS — J3081 Allergic rhinitis due to animal (cat) (dog) hair and dander: Secondary | ICD-10-CM | POA: Diagnosis not present

## 2023-06-11 ENCOUNTER — Other Ambulatory Visit: Payer: Self-pay | Admitting: Family

## 2023-06-12 ENCOUNTER — Encounter: Payer: Self-pay | Admitting: Family

## 2023-06-12 ENCOUNTER — Ambulatory Visit: Payer: Medicare PPO | Admitting: Family

## 2023-06-12 ENCOUNTER — Ambulatory Visit (INDEPENDENT_AMBULATORY_CARE_PROVIDER_SITE_OTHER): Payer: Medicare PPO

## 2023-06-12 VITALS — BP 139/69 | HR 60 | Temp 97.7°F | Ht 64.0 in | Wt 165.0 lb

## 2023-06-12 DIAGNOSIS — E1169 Type 2 diabetes mellitus with other specified complication: Secondary | ICD-10-CM

## 2023-06-12 DIAGNOSIS — F331 Major depressive disorder, recurrent, moderate: Secondary | ICD-10-CM | POA: Diagnosis not present

## 2023-06-12 DIAGNOSIS — E785 Hyperlipidemia, unspecified: Secondary | ICD-10-CM | POA: Diagnosis not present

## 2023-06-12 DIAGNOSIS — I10 Essential (primary) hypertension: Secondary | ICD-10-CM

## 2023-06-12 DIAGNOSIS — F411 Generalized anxiety disorder: Secondary | ICD-10-CM | POA: Diagnosis not present

## 2023-06-12 DIAGNOSIS — R918 Other nonspecific abnormal finding of lung field: Secondary | ICD-10-CM | POA: Diagnosis not present

## 2023-06-12 DIAGNOSIS — D509 Iron deficiency anemia, unspecified: Secondary | ICD-10-CM | POA: Diagnosis not present

## 2023-06-12 DIAGNOSIS — I700 Atherosclerosis of aorta: Secondary | ICD-10-CM

## 2023-06-12 DIAGNOSIS — G4700 Insomnia, unspecified: Secondary | ICD-10-CM

## 2023-06-12 DIAGNOSIS — R062 Wheezing: Secondary | ICD-10-CM

## 2023-06-12 DIAGNOSIS — E039 Hypothyroidism, unspecified: Secondary | ICD-10-CM | POA: Diagnosis not present

## 2023-06-12 DIAGNOSIS — J4520 Mild intermittent asthma, uncomplicated: Secondary | ICD-10-CM

## 2023-06-12 DIAGNOSIS — R531 Weakness: Secondary | ICD-10-CM

## 2023-06-12 DIAGNOSIS — E1142 Type 2 diabetes mellitus with diabetic polyneuropathy: Secondary | ICD-10-CM

## 2023-06-12 DIAGNOSIS — M5136 Other intervertebral disc degeneration, lumbar region with discogenic back pain only: Secondary | ICD-10-CM

## 2023-06-12 DIAGNOSIS — J9811 Atelectasis: Secondary | ICD-10-CM | POA: Diagnosis not present

## 2023-06-12 LAB — BAYER DCA HB A1C WAIVED: HB A1C (BAYER DCA - WAIVED): 7 % — ABNORMAL HIGH (ref 4.8–5.6)

## 2023-06-12 MED ORDER — PREDNISONE 20 MG PO TABS
40.0000 mg | ORAL_TABLET | Freq: Every day | ORAL | 0 refills | Status: AC
Start: 2023-06-12 — End: 2023-06-17

## 2023-06-12 MED ORDER — BENZONATATE 200 MG PO CAPS
200.0000 mg | ORAL_CAPSULE | Freq: Three times a day (TID) | ORAL | 1 refills | Status: DC | PRN
Start: 2023-06-12 — End: 2023-11-28

## 2023-06-12 NOTE — Patient Instructions (Signed)
Health Maintenance After Age 82 After age 82, you are at a higher risk for certain long-term diseases and infections as well as injuries from falls. Falls are a major cause of broken bones and head injuries in people who are older than age 82. Getting regular preventive care can help to keep you healthy and well. Preventive care includes getting regular testing and making lifestyle changes as recommended by your health care provider. Talk with your health care provider about: Which screenings and tests you should have. A screening is a test that checks for a disease when you have no symptoms. A diet and exercise plan that is right for you. What should I know about screenings and tests to prevent falls? Screening and testing are the best ways to find a health problem early. Early diagnosis and treatment give you the best chance of managing medical conditions that are common after age 82. Certain conditions and lifestyle choices may make you more likely to have a fall. Your health care provider may recommend: Regular vision checks. Poor vision and conditions such as cataracts can make you more likely to have a fall. If you wear glasses, make sure to get your prescription updated if your vision changes. Medicine review. Work with your health care provider to regularly review all of the medicines you are taking, including over-the-counter medicines. Ask your health care provider about any side effects that may make you more likely to have a fall. Tell your health care provider if any medicines that you take make you feel dizzy or sleepy. Strength and balance checks. Your health care provider may recommend certain tests to check your strength and balance while standing, walking, or changing positions. Foot health exam. Foot pain and numbness, as well as not wearing proper footwear, can make you more likely to have a fall. Screenings, including: Osteoporosis screening. Osteoporosis is a condition that causes  the bones to get weaker and break more easily. Blood pressure screening. Blood pressure changes and medicines to control blood pressure can make you feel dizzy. Depression screening. You may be more likely to have a fall if you have a fear of falling, feel depressed, or feel unable to do activities that you used to do. Alcohol use screening. Using too much alcohol can affect your balance and may make you more likely to have a fall. Follow these instructions at home: Lifestyle Do not drink alcohol if: Your health care provider tells you not to drink. If you drink alcohol: Limit how much you have to: 0-1 drink a day for women. 0-2 drinks a day for men. Know how much alcohol is in your drink. In the [place] one drink equals one 12 oz bottle of beer (355 mL), one 5 oz glass of wine (148 mL), or one 1 oz glass of hard liquor (44 mL). Do not use any products that contain nicotine or tobacco. These products include cigarettes, chewing tobacco, and vaping devices, such as e-cigarettes. If you need help quitting, ask your health care provider. Activity  Follow a regular exercise program to stay fit. This will help you maintain your balance. Ask your health care provider what types of exercise are appropriate for you. If you need a cane or walker, use it as recommended by your health care provider. Wear supportive shoes that have nonskid soles. Safety  Remove any tripping hazards, such as rugs, cords, and clutter. Install safety equipment such as grab bars in bathrooms and safety rails on stairs. Keep rooms and walkways   well-lit. General instructions Talk with your health care provider about your risks for falling. Tell your health care provider if: You fall. Be sure to tell your health care provider about all falls, even ones that seem minor. You feel dizzy, tiredness (fatigue), or off-balance. Take over-the-counter and prescription medicines only as told by your health care provider. These include  supplements. Eat a healthy diet and maintain a healthy weight. A healthy diet includes low-fat dairy products, low-fat (lean) meats, and fiber from whole grains, beans, and lots of fruits and vegetables. Stay current with your vaccines. Schedule regular health, dental, and eye exams. Summary Having a healthy lifestyle and getting preventive care can help to protect your health and wellness after age 82. Screening and testing are the best way to find a health problem early and help you avoid having a fall. Early diagnosis and treatment give you the best chance for managing medical conditions that are more common for people who are older than age 82. Falls are a major cause of broken bones and head injuries in people who are older than age 82. Take precautions to prevent a fall at home. Work with your health care provider to learn what changes you can make to improve your health and wellness and to prevent falls. This information is not intended to replace advice given to you by your health care provider. Make sure you discuss any questions you have with your health care provider. Document Revised: 12/25/2020 Document Reviewed: 12/25/2020 Elsevier Patient Education  2024 [employer]  

## 2023-06-12 NOTE — Progress Notes (Addendum)
Subjective:    Patient ID: Kristi Willis, female    DOB: [birth year], 82 y.o.   MRN: [id number]  Chief Complaint  Patient presents with   Medical Management of Chronic Issues   Wheezing    Started last week with cough    Pt and daughter present today for CPE and chronic follow up. She has aphasia so her daughter is doing most of the talking for her today.    She is followed by podiatry every 6 weeks. She is followed by Cardiologists annually.  Followed by Endo for hypothyroid and DM as needed. She is getting allergy injections weekly.    Daughter reports increased weakness, requesting PT referral.   Has aortic atherosclerosis and takes Crestor daily. Wheezing  This is a new problem. The current episode started in the past 7 days. The problem occurs intermittently. Associated symptoms include coughing and shortness of breath. Pertinent negatives include no chills, diarrhea, ear pain or fever. The treatment provided mild relief. Her past medical history is significant for asthma.  Hypertension This is a chronic problem. The current episode started more than 1 year ago. The problem has been resolved since onset. Associated symptoms include anxiety, malaise/fatigue and shortness of breath. Pertinent negatives include no blurred vision or peripheral edema. Risk factors for coronary artery disease include dyslipidemia and sedentary lifestyle. The current treatment provides moderate improvement. Identifiable causes of hypertension include a thyroid problem.  Asthma She complains of cough, shortness of breath and wheezing. This is a chronic problem. The current episode started more than 1 year ago. The problem occurs intermittently. Associated symptoms include malaise/fatigue. Pertinent negatives include no ear pain or fever. Her symptoms are aggravated by pollen. She reports moderate improvement on treatment. Her past medical history is significant for asthma.  Thyroid Problem Presents for  follow-up visit. Symptoms include anxiety and fatigue. Patient reports no constipation, diaphoresis or diarrhea. The symptoms have been stable. Her past medical history is significant for hyperlipidemia.  Hyperlipidemia This is a chronic problem. The current episode started more than 1 year ago. Associated symptoms include shortness of breath. Current antihyperlipidemic treatment includes statins. The current treatment provides moderate improvement of lipids. Risk factors for coronary artery disease include dyslipidemia, hypertension and a sedentary lifestyle.  Diabetes She presents for her follow-up diabetic visit. She has type 2 diabetes mellitus. Hypoglycemia symptoms include nervousness/anxiousness. Associated symptoms include fatigue and foot paresthesias. Pertinent negatives for diabetes include no blurred vision. Symptoms are stable. Diabetic complications include peripheral neuropathy. Risk factors for coronary artery disease include dyslipidemia, diabetes mellitus, hypertension, sedentary lifestyle and post-menopausal. She is following a generally healthy diet. (Does not check glucose at home) Eye exam is current.  Back Pain This is a chronic problem. The current episode started more than 1 year ago. The problem occurs intermittently. The problem has been waxing and waning since onset. The pain is present in the lumbar spine. The quality of the pain is described as aching. The pain is at a severity of 5/10. The pain is moderate. Pertinent negatives include no fever. The treatment provided moderate relief.  Anemia Presents for follow-up visit. Symptoms include malaise/fatigue. There has been no fever.  Insomnia Primary symptoms: difficulty falling asleep, frequent awakening, malaise/fatigue.   The current episode started more than one year. The problem occurs intermittently. Past treatments include medication. The treatment provided moderate relief. PMH includes: depression.   Anxiety Presents  for follow-up visit. Symptoms include excessive worry, insomnia, nervous/anxious behavior and shortness of breath. Symptoms  occur rarely. The severity of symptoms is mild.   Her past medical history is significant for anemia and asthma.  Depression        This is a chronic problem.  The current episode started more than 1 year ago.   The problem occurs intermittently.  Associated symptoms include fatigue, insomnia and sad.  Associated symptoms include no helplessness and no hopelessness.  Past medical history includes thyroid problem and anxiety.       Review of Systems  Constitutional:  Positive for fatigue and malaise/fatigue. Negative for chills, diaphoresis and fever.  HENT:  Negative for ear pain.   Eyes:  Negative for blurred vision.  Respiratory:  Positive for cough, shortness of breath and wheezing.   Gastrointestinal:  Negative for constipation and diarrhea.  Musculoskeletal:  Positive for back pain.  Psychiatric/Behavioral:  Positive for depression. The patient is nervous/anxious and has insomnia.   All other systems reviewed and are negative.      Objective:   Physical Exam Vitals reviewed.  Constitutional:      General: She is not in acute distress.    Appearance: She is well-developed.  HENT:     Head: Normocephalic and atraumatic.     Right Ear: Tympanic membrane normal.     Left Ear: Tympanic membrane normal.  Eyes:     Pupils: Pupils are equal, round, and reactive to light.  Neck:     Thyroid: No thyromegaly.  Cardiovascular:     Rate and Rhythm: Normal rate and regular rhythm.     Heart sounds: Normal heart sounds. No murmur heard. Pulmonary:     Effort: Pulmonary effort is normal. No respiratory distress.     Breath sounds: Wheezing present.  Abdominal:     General: Bowel sounds are normal. There is no distension.     Palpations: Abdomen is soft.     Tenderness: There is no abdominal tenderness.  Musculoskeletal:        General: No tenderness. Normal  range of motion.     Cervical back: Normal range of motion and neck supple.  Skin:    General: Skin is warm and dry.  Neurological:     Mental Status: She is alert and oriented to person, place, and time.     Cranial Nerves: No cranial nerve deficit.     Deep Tendon Reflexes: Reflexes are normal and symmetric.  Psychiatric:        Speech: She is noncommunicative.        Behavior: Behavior normal.        Thought Content: Thought content normal.        Judgment: Judgment normal.     Comments: Aphasia, writes every down       BP 139/69   Pulse 60   Temp 97.7 F (36.5 C) (Temporal)   Ht 5\' 4"  (1.626 m)   Wt 165 lb (74.8 kg)   SpO2 96%   BMI 28.32 kg/m      Assessment & Plan:  JAZALE LUDLAM comes in today with chief complaint of Medical Management of Chronic Issues and Wheezing (Started last week with cough )   Diagnosis and orders addressed:  1. Insomnia, unspecified type - CMP14+EGFR  2. Hypothyroidism, unspecified type - CMP14+EGFR  3. Iron deficiency anemia, unspecified iron deficiency anemia type - CMP14+EGFR  4. Hyperlipidemia associated with type 2 diabetes mellitus (HCC) - CMP14+EGFR  5. GAD (generalized anxiety disorder) - CMP14+EGFR  6. Essential hypertension - CMP14+EGFR  7. Type 2  diabetes mellitus with diabetic polyneuropathy, without long-term current use of insulin (HCC) - Bayer DCA Hb A1c Waived - CMP14+EGFR  8. Moderate episode of recurrent major depressive disorder (HCC) - CMP14+EGFR  9. Mild intermittent asthma without complication Start prednisone  - Take meds as prescribed - Use a cool mist humidifier  -Use saline nose sprays frequently -Force fluids -For any cough or congestion  Use plain Mucinex- regular strength or max strength is fine -For fever or aces or pains- take tylenol or ibuprofen. -Throat lozenges if help -Follow up if symptoms worsen or do not improve  - CMP14+EGFR - DG Chest 2 View - predniSONE (DELTASONE)  20 MG tablet; Take 2 tablets (40 mg total) by mouth daily with breakfast for 5 days.  Dispense: 10 tablet; Refill: 0 - benzonatate (TESSALON) 200 MG capsule; Take 1 capsule (200 mg total) by mouth 3 (three) times daily as needed.  Dispense: 30 capsule; Refill: 1  10. Aortic atherosclerosis (HCC) - CMP14+EGFR  11. Degeneration of intervertebral disc of lumbar region with discogenic back pain - CMP14+EGFR  12. Wheezing - CMP14+EGFR - DG Chest 2 View   13. Weakness - Ambulatory referral to Home Health  Labs pending Health Maintenance reviewed Diet and exercise encouraged  Follow up plan: 4 months   Jannifer Rodney, FNP

## 2023-06-12 NOTE — Addendum Note (Signed)
Addended by: Jannifer Rodney A on: 06/12/2023 03:26 PM   Modules accepted: Orders

## 2023-06-13 LAB — CMP14+EGFR
ALT: 15 [IU]/L (ref 0–32)
AST: 21 [IU]/L (ref 0–40)
Albumin: 4.3 g/dL (ref 3.7–4.7)
Alkaline Phosphatase: 99 [IU]/L (ref 44–121)
BUN/Creatinine Ratio: 22 (ref 12–28)
BUN: 15 mg/dL (ref 8–27)
Bilirubin Total: 0.6 mg/dL (ref 0.0–1.2)
CO2: 24 mmol/L (ref 20–29)
Calcium: 9.2 mg/dL (ref 8.7–10.3)
Chloride: 101 mmol/L (ref 96–106)
Creatinine, Ser: 0.68 mg/dL (ref 0.57–1.00)
Globulin, Total: 1.9 g/dL (ref 1.5–4.5)
Glucose: 135 mg/dL — ABNORMAL HIGH (ref 70–99)
Potassium: 4.3 mmol/L (ref 3.5–5.2)
Sodium: 139 mmol/L (ref 134–144)
Total Protein: 6.2 g/dL (ref 6.0–8.5)
eGFR: 87 mL/min/{1.73_m2} (ref >59)

## 2023-06-19 DIAGNOSIS — J3081 Allergic rhinitis due to animal (cat) (dog) hair and dander: Secondary | ICD-10-CM | POA: Diagnosis not present

## 2023-06-19 DIAGNOSIS — J301 Allergic rhinitis due to pollen: Secondary | ICD-10-CM | POA: Diagnosis not present

## 2023-06-19 DIAGNOSIS — J3089 Other allergic rhinitis: Secondary | ICD-10-CM | POA: Diagnosis not present

## 2023-06-20 DIAGNOSIS — E785 Hyperlipidemia, unspecified: Secondary | ICD-10-CM | POA: Diagnosis not present

## 2023-06-20 DIAGNOSIS — F331 Major depressive disorder, recurrent, moderate: Secondary | ICD-10-CM | POA: Diagnosis not present

## 2023-06-20 DIAGNOSIS — F0284 Dementia in other diseases classified elsewhere, unspecified severity, with anxiety: Secondary | ICD-10-CM | POA: Diagnosis not present

## 2023-06-20 DIAGNOSIS — F411 Generalized anxiety disorder: Secondary | ICD-10-CM | POA: Diagnosis not present

## 2023-06-20 DIAGNOSIS — E039 Hypothyroidism, unspecified: Secondary | ICD-10-CM | POA: Diagnosis not present

## 2023-06-20 DIAGNOSIS — G3101 Pick's disease: Secondary | ICD-10-CM | POA: Diagnosis not present

## 2023-06-20 DIAGNOSIS — F0283 Dementia in other diseases classified elsewhere, unspecified severity, with mood disturbance: Secondary | ICD-10-CM | POA: Diagnosis not present

## 2023-06-20 DIAGNOSIS — E1169 Type 2 diabetes mellitus with other specified complication: Secondary | ICD-10-CM | POA: Diagnosis not present

## 2023-06-20 DIAGNOSIS — E1142 Type 2 diabetes mellitus with diabetic polyneuropathy: Secondary | ICD-10-CM | POA: Diagnosis not present

## 2023-06-24 ENCOUNTER — Other Ambulatory Visit: Payer: Self-pay | Admitting: Family

## 2023-06-24 DIAGNOSIS — F0284 Dementia in other diseases classified elsewhere, unspecified severity, with anxiety: Secondary | ICD-10-CM | POA: Diagnosis not present

## 2023-06-24 DIAGNOSIS — G3101 Pick's disease: Secondary | ICD-10-CM | POA: Diagnosis not present

## 2023-06-24 DIAGNOSIS — J4520 Mild intermittent asthma, uncomplicated: Secondary | ICD-10-CM

## 2023-06-24 DIAGNOSIS — E785 Hyperlipidemia, unspecified: Secondary | ICD-10-CM | POA: Diagnosis not present

## 2023-06-24 DIAGNOSIS — I10 Essential (primary) hypertension: Secondary | ICD-10-CM

## 2023-06-24 DIAGNOSIS — F0283 Dementia in other diseases classified elsewhere, unspecified severity, with mood disturbance: Secondary | ICD-10-CM | POA: Diagnosis not present

## 2023-06-24 DIAGNOSIS — F331 Major depressive disorder, recurrent, moderate: Secondary | ICD-10-CM

## 2023-06-24 DIAGNOSIS — F411 Generalized anxiety disorder: Secondary | ICD-10-CM

## 2023-06-24 DIAGNOSIS — E1142 Type 2 diabetes mellitus with diabetic polyneuropathy: Secondary | ICD-10-CM

## 2023-06-24 DIAGNOSIS — E039 Hypothyroidism, unspecified: Secondary | ICD-10-CM | POA: Diagnosis not present

## 2023-06-24 DIAGNOSIS — E1169 Type 2 diabetes mellitus with other specified complication: Secondary | ICD-10-CM | POA: Diagnosis not present

## 2023-06-26 ENCOUNTER — Telehealth: Payer: Self-pay

## 2023-06-26 DIAGNOSIS — E1169 Type 2 diabetes mellitus with other specified complication: Secondary | ICD-10-CM | POA: Diagnosis not present

## 2023-06-26 DIAGNOSIS — E1142 Type 2 diabetes mellitus with diabetic polyneuropathy: Secondary | ICD-10-CM | POA: Diagnosis not present

## 2023-06-26 DIAGNOSIS — F0283 Dementia in other diseases classified elsewhere, unspecified severity, with mood disturbance: Secondary | ICD-10-CM | POA: Diagnosis not present

## 2023-06-26 DIAGNOSIS — E039 Hypothyroidism, unspecified: Secondary | ICD-10-CM | POA: Diagnosis not present

## 2023-06-26 DIAGNOSIS — E785 Hyperlipidemia, unspecified: Secondary | ICD-10-CM | POA: Diagnosis not present

## 2023-06-26 DIAGNOSIS — F0284 Dementia in other diseases classified elsewhere, unspecified severity, with anxiety: Secondary | ICD-10-CM | POA: Diagnosis not present

## 2023-06-26 DIAGNOSIS — F411 Generalized anxiety disorder: Secondary | ICD-10-CM | POA: Diagnosis not present

## 2023-06-26 DIAGNOSIS — F331 Major depressive disorder, recurrent, moderate: Secondary | ICD-10-CM | POA: Diagnosis not present

## 2023-06-26 DIAGNOSIS — G3101 Pick's disease: Secondary | ICD-10-CM | POA: Diagnosis not present

## 2023-06-26 MED ORDER — DOXYCYCLINE HYCLATE 100 MG PO TABS: 100.0000 mg | ORAL_TABLET | Freq: Two times a day (BID) | ORAL | 0 refills | Status: DC

## 2023-06-26 MED ORDER — PREDNISONE 10 MG (21) PO TBPK: ORAL_TABLET | ORAL | 0 refills | Status: DC

## 2023-06-26 NOTE — Telephone Encounter (Signed)
Doxycyline and prednisone Prescription sent to pharmacy

## 2023-06-26 NOTE — Telephone Encounter (Signed)
Cough is worse. Chest xray not back. Patients daughter states she needs abx sent into [facility] pharmacy and prednisone

## 2023-06-26 NOTE — Telephone Encounter (Signed)
Copied from CRM [id number]. Topic: Clinical - Medical Advice >> Jun 26, 2023  2:50 PM Larwance Sachs wrote: Reason for CRM: Patients daughter called in to inform Provider Neysa Bonito or Morrie Sheldon that the patients symptoms have worsened since last appointment Call back number is [phone]

## 2023-06-26 NOTE — Addendum Note (Signed)
Addended by: Jannifer Rodney A on: 06/26/2023 04:39 PM   Modules accepted: Orders

## 2023-06-27 DIAGNOSIS — J3089 Other allergic rhinitis: Secondary | ICD-10-CM | POA: Diagnosis not present

## 2023-06-27 DIAGNOSIS — J3081 Allergic rhinitis due to animal (cat) (dog) hair and dander: Secondary | ICD-10-CM | POA: Diagnosis not present

## 2023-06-27 DIAGNOSIS — J301 Allergic rhinitis due to pollen: Secondary | ICD-10-CM | POA: Diagnosis not present

## 2023-06-27 NOTE — Telephone Encounter (Signed)
Called to let patients daughter know requested meds went to pharmacy left detailed message.

## 2023-06-30 DIAGNOSIS — F411 Generalized anxiety disorder: Secondary | ICD-10-CM | POA: Diagnosis not present

## 2023-06-30 DIAGNOSIS — E785 Hyperlipidemia, unspecified: Secondary | ICD-10-CM | POA: Diagnosis not present

## 2023-06-30 DIAGNOSIS — E1142 Type 2 diabetes mellitus with diabetic polyneuropathy: Secondary | ICD-10-CM | POA: Diagnosis not present

## 2023-06-30 DIAGNOSIS — F0283 Dementia in other diseases classified elsewhere, unspecified severity, with mood disturbance: Secondary | ICD-10-CM | POA: Diagnosis not present

## 2023-06-30 DIAGNOSIS — E039 Hypothyroidism, unspecified: Secondary | ICD-10-CM | POA: Diagnosis not present

## 2023-06-30 DIAGNOSIS — F0284 Dementia in other diseases classified elsewhere, unspecified severity, with anxiety: Secondary | ICD-10-CM | POA: Diagnosis not present

## 2023-06-30 DIAGNOSIS — G3101 Pick's disease: Secondary | ICD-10-CM | POA: Diagnosis not present

## 2023-06-30 DIAGNOSIS — F331 Major depressive disorder, recurrent, moderate: Secondary | ICD-10-CM | POA: Diagnosis not present

## 2023-06-30 DIAGNOSIS — E1169 Type 2 diabetes mellitus with other specified complication: Secondary | ICD-10-CM | POA: Diagnosis not present

## 2023-07-03 ENCOUNTER — Other Ambulatory Visit: Payer: Self-pay | Admitting: Family

## 2023-07-03 DIAGNOSIS — E1142 Type 2 diabetes mellitus with diabetic polyneuropathy: Secondary | ICD-10-CM | POA: Diagnosis not present

## 2023-07-03 DIAGNOSIS — E785 Hyperlipidemia, unspecified: Secondary | ICD-10-CM | POA: Diagnosis not present

## 2023-07-03 DIAGNOSIS — F0284 Dementia in other diseases classified elsewhere, unspecified severity, with anxiety: Secondary | ICD-10-CM | POA: Diagnosis not present

## 2023-07-03 DIAGNOSIS — F0283 Dementia in other diseases classified elsewhere, unspecified severity, with mood disturbance: Secondary | ICD-10-CM | POA: Diagnosis not present

## 2023-07-03 DIAGNOSIS — F331 Major depressive disorder, recurrent, moderate: Secondary | ICD-10-CM | POA: Diagnosis not present

## 2023-07-03 DIAGNOSIS — E1169 Type 2 diabetes mellitus with other specified complication: Secondary | ICD-10-CM | POA: Diagnosis not present

## 2023-07-03 DIAGNOSIS — E039 Hypothyroidism, unspecified: Secondary | ICD-10-CM | POA: Diagnosis not present

## 2023-07-03 DIAGNOSIS — G3101 Pick's disease: Secondary | ICD-10-CM | POA: Diagnosis not present

## 2023-07-03 DIAGNOSIS — F411 Generalized anxiety disorder: Secondary | ICD-10-CM | POA: Diagnosis not present

## 2023-07-07 ENCOUNTER — Ambulatory Visit (INDEPENDENT_AMBULATORY_CARE_PROVIDER_SITE_OTHER): Payer: Medicare PPO

## 2023-07-07 ENCOUNTER — Other Ambulatory Visit: Payer: Self-pay | Admitting: Family

## 2023-07-07 DIAGNOSIS — E1142 Type 2 diabetes mellitus with diabetic polyneuropathy: Secondary | ICD-10-CM | POA: Diagnosis not present

## 2023-07-07 DIAGNOSIS — E785 Hyperlipidemia, unspecified: Secondary | ICD-10-CM

## 2023-07-07 DIAGNOSIS — F331 Major depressive disorder, recurrent, moderate: Secondary | ICD-10-CM

## 2023-07-07 DIAGNOSIS — G3101 Pick's disease: Secondary | ICD-10-CM

## 2023-07-07 DIAGNOSIS — F0284 Dementia in other diseases classified elsewhere, unspecified severity, with anxiety: Secondary | ICD-10-CM | POA: Diagnosis not present

## 2023-07-07 DIAGNOSIS — E1169 Type 2 diabetes mellitus with other specified complication: Secondary | ICD-10-CM | POA: Diagnosis not present

## 2023-07-07 DIAGNOSIS — E039 Hypothyroidism, unspecified: Secondary | ICD-10-CM

## 2023-07-07 DIAGNOSIS — F411 Generalized anxiety disorder: Secondary | ICD-10-CM

## 2023-07-07 DIAGNOSIS — I700 Atherosclerosis of aorta: Secondary | ICD-10-CM

## 2023-07-07 DIAGNOSIS — Z7984 Long term (current) use of oral hypoglycemic drugs: Secondary | ICD-10-CM

## 2023-07-07 DIAGNOSIS — F0283 Dementia in other diseases classified elsewhere, unspecified severity, with mood disturbance: Secondary | ICD-10-CM | POA: Diagnosis not present

## 2023-07-10 DIAGNOSIS — J3089 Other allergic rhinitis: Secondary | ICD-10-CM | POA: Diagnosis not present

## 2023-07-10 DIAGNOSIS — J301 Allergic rhinitis due to pollen: Secondary | ICD-10-CM | POA: Diagnosis not present

## 2023-07-10 DIAGNOSIS — J3081 Allergic rhinitis due to animal (cat) (dog) hair and dander: Secondary | ICD-10-CM | POA: Diagnosis not present

## 2023-07-15 ENCOUNTER — Other Ambulatory Visit: Payer: Self-pay | Admitting: Family

## 2023-07-15 ENCOUNTER — Ambulatory Visit (INDEPENDENT_AMBULATORY_CARE_PROVIDER_SITE_OTHER): Payer: Medicare PPO

## 2023-07-15 DIAGNOSIS — I517 Cardiomegaly: Secondary | ICD-10-CM | POA: Diagnosis not present

## 2023-07-15 DIAGNOSIS — J90 Pleural effusion, not elsewhere classified: Secondary | ICD-10-CM | POA: Diagnosis not present

## 2023-07-15 DIAGNOSIS — R9389 Abnormal findings on diagnostic imaging of other specified body structures: Secondary | ICD-10-CM

## 2023-07-15 DIAGNOSIS — R062 Wheezing: Secondary | ICD-10-CM

## 2023-07-16 DIAGNOSIS — J301 Allergic rhinitis due to pollen: Secondary | ICD-10-CM | POA: Diagnosis not present

## 2023-07-16 DIAGNOSIS — J3081 Allergic rhinitis due to animal (cat) (dog) hair and dander: Secondary | ICD-10-CM | POA: Diagnosis not present

## 2023-07-16 DIAGNOSIS — J3089 Other allergic rhinitis: Secondary | ICD-10-CM | POA: Diagnosis not present

## 2023-07-18 DIAGNOSIS — E039 Hypothyroidism, unspecified: Secondary | ICD-10-CM | POA: Diagnosis not present

## 2023-07-18 DIAGNOSIS — E1169 Type 2 diabetes mellitus with other specified complication: Secondary | ICD-10-CM | POA: Diagnosis not present

## 2023-07-18 DIAGNOSIS — G3101 Pick's disease: Secondary | ICD-10-CM | POA: Diagnosis not present

## 2023-07-18 DIAGNOSIS — E785 Hyperlipidemia, unspecified: Secondary | ICD-10-CM | POA: Diagnosis not present

## 2023-07-18 DIAGNOSIS — F0283 Dementia in other diseases classified elsewhere, unspecified severity, with mood disturbance: Secondary | ICD-10-CM | POA: Diagnosis not present

## 2023-07-18 DIAGNOSIS — E1142 Type 2 diabetes mellitus with diabetic polyneuropathy: Secondary | ICD-10-CM | POA: Diagnosis not present

## 2023-07-18 DIAGNOSIS — F331 Major depressive disorder, recurrent, moderate: Secondary | ICD-10-CM | POA: Diagnosis not present

## 2023-07-18 DIAGNOSIS — F411 Generalized anxiety disorder: Secondary | ICD-10-CM | POA: Diagnosis not present

## 2023-07-18 DIAGNOSIS — F0284 Dementia in other diseases classified elsewhere, unspecified severity, with anxiety: Secondary | ICD-10-CM | POA: Diagnosis not present

## 2023-07-20 DIAGNOSIS — F331 Major depressive disorder, recurrent, moderate: Secondary | ICD-10-CM | POA: Diagnosis not present

## 2023-07-20 DIAGNOSIS — G3101 Pick's disease: Secondary | ICD-10-CM | POA: Diagnosis not present

## 2023-07-20 DIAGNOSIS — F411 Generalized anxiety disorder: Secondary | ICD-10-CM | POA: Diagnosis not present

## 2023-07-20 DIAGNOSIS — F0284 Dementia in other diseases classified elsewhere, unspecified severity, with anxiety: Secondary | ICD-10-CM | POA: Diagnosis not present

## 2023-07-20 DIAGNOSIS — E1142 Type 2 diabetes mellitus with diabetic polyneuropathy: Secondary | ICD-10-CM | POA: Diagnosis not present

## 2023-07-20 DIAGNOSIS — F0283 Dementia in other diseases classified elsewhere, unspecified severity, with mood disturbance: Secondary | ICD-10-CM | POA: Diagnosis not present

## 2023-07-20 DIAGNOSIS — E785 Hyperlipidemia, unspecified: Secondary | ICD-10-CM | POA: Diagnosis not present

## 2023-07-20 DIAGNOSIS — E039 Hypothyroidism, unspecified: Secondary | ICD-10-CM | POA: Diagnosis not present

## 2023-07-20 DIAGNOSIS — E1169 Type 2 diabetes mellitus with other specified complication: Secondary | ICD-10-CM | POA: Diagnosis not present

## 2023-07-22 DIAGNOSIS — F0284 Dementia in other diseases classified elsewhere, unspecified severity, with anxiety: Secondary | ICD-10-CM | POA: Diagnosis not present

## 2023-07-22 DIAGNOSIS — G3101 Pick's disease: Secondary | ICD-10-CM | POA: Diagnosis not present

## 2023-07-22 DIAGNOSIS — F331 Major depressive disorder, recurrent, moderate: Secondary | ICD-10-CM | POA: Diagnosis not present

## 2023-07-22 DIAGNOSIS — F0283 Dementia in other diseases classified elsewhere, unspecified severity, with mood disturbance: Secondary | ICD-10-CM | POA: Diagnosis not present

## 2023-07-22 DIAGNOSIS — E1142 Type 2 diabetes mellitus with diabetic polyneuropathy: Secondary | ICD-10-CM | POA: Diagnosis not present

## 2023-07-22 DIAGNOSIS — E039 Hypothyroidism, unspecified: Secondary | ICD-10-CM | POA: Diagnosis not present

## 2023-07-22 DIAGNOSIS — F411 Generalized anxiety disorder: Secondary | ICD-10-CM | POA: Diagnosis not present

## 2023-07-22 DIAGNOSIS — E785 Hyperlipidemia, unspecified: Secondary | ICD-10-CM | POA: Diagnosis not present

## 2023-07-22 DIAGNOSIS — E1169 Type 2 diabetes mellitus with other specified complication: Secondary | ICD-10-CM | POA: Diagnosis not present

## 2023-07-25 DIAGNOSIS — J3089 Other allergic rhinitis: Secondary | ICD-10-CM | POA: Diagnosis not present

## 2023-07-25 DIAGNOSIS — J3081 Allergic rhinitis due to animal (cat) (dog) hair and dander: Secondary | ICD-10-CM | POA: Diagnosis not present

## 2023-07-25 DIAGNOSIS — J301 Allergic rhinitis due to pollen: Secondary | ICD-10-CM | POA: Diagnosis not present

## 2023-07-28 DIAGNOSIS — F0283 Dementia in other diseases classified elsewhere, unspecified severity, with mood disturbance: Secondary | ICD-10-CM | POA: Diagnosis not present

## 2023-07-28 DIAGNOSIS — F411 Generalized anxiety disorder: Secondary | ICD-10-CM | POA: Diagnosis not present

## 2023-07-28 DIAGNOSIS — G3101 Pick's disease: Secondary | ICD-10-CM | POA: Diagnosis not present

## 2023-07-28 DIAGNOSIS — E1142 Type 2 diabetes mellitus with diabetic polyneuropathy: Secondary | ICD-10-CM | POA: Diagnosis not present

## 2023-07-28 DIAGNOSIS — F331 Major depressive disorder, recurrent, moderate: Secondary | ICD-10-CM | POA: Diagnosis not present

## 2023-07-28 DIAGNOSIS — F0284 Dementia in other diseases classified elsewhere, unspecified severity, with anxiety: Secondary | ICD-10-CM | POA: Diagnosis not present

## 2023-07-28 DIAGNOSIS — E785 Hyperlipidemia, unspecified: Secondary | ICD-10-CM | POA: Diagnosis not present

## 2023-07-28 DIAGNOSIS — E1169 Type 2 diabetes mellitus with other specified complication: Secondary | ICD-10-CM | POA: Diagnosis not present

## 2023-07-28 DIAGNOSIS — E039 Hypothyroidism, unspecified: Secondary | ICD-10-CM | POA: Diagnosis not present

## 2023-07-31 DIAGNOSIS — L84 Corns and callosities: Secondary | ICD-10-CM | POA: Diagnosis not present

## 2023-07-31 DIAGNOSIS — M79676 Pain in unspecified toe(s): Secondary | ICD-10-CM | POA: Diagnosis not present

## 2023-07-31 DIAGNOSIS — B351 Tinea unguium: Secondary | ICD-10-CM | POA: Diagnosis not present

## 2023-07-31 DIAGNOSIS — E1142 Type 2 diabetes mellitus with diabetic polyneuropathy: Secondary | ICD-10-CM | POA: Diagnosis not present

## 2023-08-05 DIAGNOSIS — J3089 Other allergic rhinitis: Secondary | ICD-10-CM | POA: Diagnosis not present

## 2023-08-05 DIAGNOSIS — J301 Allergic rhinitis due to pollen: Secondary | ICD-10-CM | POA: Diagnosis not present

## 2023-08-05 DIAGNOSIS — J3081 Allergic rhinitis due to animal (cat) (dog) hair and dander: Secondary | ICD-10-CM | POA: Diagnosis not present

## 2023-08-07 DIAGNOSIS — E785 Hyperlipidemia, unspecified: Secondary | ICD-10-CM | POA: Diagnosis not present

## 2023-08-07 DIAGNOSIS — E1169 Type 2 diabetes mellitus with other specified complication: Secondary | ICD-10-CM | POA: Diagnosis not present

## 2023-08-07 DIAGNOSIS — F331 Major depressive disorder, recurrent, moderate: Secondary | ICD-10-CM | POA: Diagnosis not present

## 2023-08-07 DIAGNOSIS — E1142 Type 2 diabetes mellitus with diabetic polyneuropathy: Secondary | ICD-10-CM | POA: Diagnosis not present

## 2023-08-07 DIAGNOSIS — F411 Generalized anxiety disorder: Secondary | ICD-10-CM | POA: Diagnosis not present

## 2023-08-07 DIAGNOSIS — G3101 Pick's disease: Secondary | ICD-10-CM | POA: Diagnosis not present

## 2023-08-07 DIAGNOSIS — E039 Hypothyroidism, unspecified: Secondary | ICD-10-CM | POA: Diagnosis not present

## 2023-08-07 DIAGNOSIS — F0283 Dementia in other diseases classified elsewhere, unspecified severity, with mood disturbance: Secondary | ICD-10-CM | POA: Diagnosis not present

## 2023-08-07 DIAGNOSIS — F0284 Dementia in other diseases classified elsewhere, unspecified severity, with anxiety: Secondary | ICD-10-CM | POA: Diagnosis not present

## 2023-08-14 DIAGNOSIS — F411 Generalized anxiety disorder: Secondary | ICD-10-CM | POA: Diagnosis not present

## 2023-08-14 DIAGNOSIS — G3101 Pick's disease: Secondary | ICD-10-CM | POA: Diagnosis not present

## 2023-08-14 DIAGNOSIS — J3081 Allergic rhinitis due to animal (cat) (dog) hair and dander: Secondary | ICD-10-CM | POA: Diagnosis not present

## 2023-08-14 DIAGNOSIS — E039 Hypothyroidism, unspecified: Secondary | ICD-10-CM | POA: Diagnosis not present

## 2023-08-14 DIAGNOSIS — F0283 Dementia in other diseases classified elsewhere, unspecified severity, with mood disturbance: Secondary | ICD-10-CM | POA: Diagnosis not present

## 2023-08-14 DIAGNOSIS — E785 Hyperlipidemia, unspecified: Secondary | ICD-10-CM | POA: Diagnosis not present

## 2023-08-14 DIAGNOSIS — E1169 Type 2 diabetes mellitus with other specified complication: Secondary | ICD-10-CM | POA: Diagnosis not present

## 2023-08-14 DIAGNOSIS — J301 Allergic rhinitis due to pollen: Secondary | ICD-10-CM | POA: Diagnosis not present

## 2023-08-14 DIAGNOSIS — E1142 Type 2 diabetes mellitus with diabetic polyneuropathy: Secondary | ICD-10-CM | POA: Diagnosis not present

## 2023-08-14 DIAGNOSIS — F0284 Dementia in other diseases classified elsewhere, unspecified severity, with anxiety: Secondary | ICD-10-CM | POA: Diagnosis not present

## 2023-08-14 DIAGNOSIS — F331 Major depressive disorder, recurrent, moderate: Secondary | ICD-10-CM | POA: Diagnosis not present

## 2023-08-14 DIAGNOSIS — J3089 Other allergic rhinitis: Secondary | ICD-10-CM | POA: Diagnosis not present

## 2023-08-22 DIAGNOSIS — J3081 Allergic rhinitis due to animal (cat) (dog) hair and dander: Secondary | ICD-10-CM | POA: Diagnosis not present

## 2023-08-22 DIAGNOSIS — J301 Allergic rhinitis due to pollen: Secondary | ICD-10-CM | POA: Diagnosis not present

## 2023-08-22 DIAGNOSIS — J3089 Other allergic rhinitis: Secondary | ICD-10-CM | POA: Diagnosis not present

## 2023-08-29 DIAGNOSIS — J301 Allergic rhinitis due to pollen: Secondary | ICD-10-CM | POA: Diagnosis not present

## 2023-08-29 DIAGNOSIS — J3089 Other allergic rhinitis: Secondary | ICD-10-CM | POA: Diagnosis not present

## 2023-08-29 DIAGNOSIS — J3081 Allergic rhinitis due to animal (cat) (dog) hair and dander: Secondary | ICD-10-CM | POA: Diagnosis not present

## 2023-09-03 DIAGNOSIS — J3081 Allergic rhinitis due to animal (cat) (dog) hair and dander: Secondary | ICD-10-CM | POA: Diagnosis not present

## 2023-09-03 DIAGNOSIS — J3089 Other allergic rhinitis: Secondary | ICD-10-CM | POA: Diagnosis not present

## 2023-09-03 DIAGNOSIS — T783XXD Angioneurotic edema, subsequent encounter: Secondary | ICD-10-CM | POA: Diagnosis not present

## 2023-09-03 DIAGNOSIS — J4530 Mild persistent asthma, uncomplicated: Secondary | ICD-10-CM | POA: Diagnosis not present

## 2023-09-03 DIAGNOSIS — J301 Allergic rhinitis due to pollen: Secondary | ICD-10-CM | POA: Diagnosis not present

## 2023-09-08 ENCOUNTER — Other Ambulatory Visit: Payer: Self-pay | Admitting: Family

## 2023-09-08 DIAGNOSIS — R062 Wheezing: Secondary | ICD-10-CM

## 2023-09-12 DIAGNOSIS — J3089 Other allergic rhinitis: Secondary | ICD-10-CM | POA: Diagnosis not present

## 2023-09-12 DIAGNOSIS — J301 Allergic rhinitis due to pollen: Secondary | ICD-10-CM | POA: Diagnosis not present

## 2023-09-12 DIAGNOSIS — J3081 Allergic rhinitis due to animal (cat) (dog) hair and dander: Secondary | ICD-10-CM | POA: Diagnosis not present

## 2023-09-18 ENCOUNTER — Other Ambulatory Visit: Payer: Self-pay | Admitting: Family

## 2023-09-18 NOTE — Telephone Encounter (Signed)
Appt scheduled for 10/07/2023

## 2023-09-18 NOTE — Telephone Encounter (Signed)
Kristi Willis NTBS in Feb for 4 mos FU RF sent to pharmacy

## 2023-09-19 DIAGNOSIS — J3089 Other allergic rhinitis: Secondary | ICD-10-CM | POA: Diagnosis not present

## 2023-09-19 DIAGNOSIS — J301 Allergic rhinitis due to pollen: Secondary | ICD-10-CM | POA: Diagnosis not present

## 2023-09-19 DIAGNOSIS — J3081 Allergic rhinitis due to animal (cat) (dog) hair and dander: Secondary | ICD-10-CM | POA: Diagnosis not present

## 2023-09-26 DIAGNOSIS — J3089 Other allergic rhinitis: Secondary | ICD-10-CM | POA: Diagnosis not present

## 2023-09-26 DIAGNOSIS — J301 Allergic rhinitis due to pollen: Secondary | ICD-10-CM | POA: Diagnosis not present

## 2023-09-26 DIAGNOSIS — J3081 Allergic rhinitis due to animal (cat) (dog) hair and dander: Secondary | ICD-10-CM | POA: Diagnosis not present

## 2023-10-03 DIAGNOSIS — J301 Allergic rhinitis due to pollen: Secondary | ICD-10-CM | POA: Diagnosis not present

## 2023-10-03 DIAGNOSIS — J3081 Allergic rhinitis due to animal (cat) (dog) hair and dander: Secondary | ICD-10-CM | POA: Diagnosis not present

## 2023-10-03 DIAGNOSIS — J3089 Other allergic rhinitis: Secondary | ICD-10-CM | POA: Diagnosis not present

## 2023-10-07 ENCOUNTER — Telehealth: Payer: Medicare PPO | Admitting: Family

## 2023-10-07 ENCOUNTER — Encounter: Payer: Self-pay | Admitting: Family

## 2023-10-07 DIAGNOSIS — I700 Atherosclerosis of aorta: Secondary | ICD-10-CM | POA: Diagnosis not present

## 2023-10-07 DIAGNOSIS — G4700 Insomnia, unspecified: Secondary | ICD-10-CM

## 2023-10-07 DIAGNOSIS — E785 Hyperlipidemia, unspecified: Secondary | ICD-10-CM

## 2023-10-07 DIAGNOSIS — J4520 Mild intermittent asthma, uncomplicated: Secondary | ICD-10-CM

## 2023-10-07 DIAGNOSIS — E039 Hypothyroidism, unspecified: Secondary | ICD-10-CM | POA: Diagnosis not present

## 2023-10-07 DIAGNOSIS — F331 Major depressive disorder, recurrent, moderate: Secondary | ICD-10-CM

## 2023-10-07 DIAGNOSIS — E1142 Type 2 diabetes mellitus with diabetic polyneuropathy: Secondary | ICD-10-CM

## 2023-10-07 DIAGNOSIS — G3101 Pick's disease: Secondary | ICD-10-CM

## 2023-10-07 DIAGNOSIS — F411 Generalized anxiety disorder: Secondary | ICD-10-CM

## 2023-10-07 DIAGNOSIS — F0280 Dementia in other diseases classified elsewhere without behavioral disturbance: Secondary | ICD-10-CM

## 2023-10-07 DIAGNOSIS — I10 Essential (primary) hypertension: Secondary | ICD-10-CM | POA: Diagnosis not present

## 2023-10-07 DIAGNOSIS — E1169 Type 2 diabetes mellitus with other specified complication: Secondary | ICD-10-CM

## 2023-10-07 NOTE — Progress Notes (Signed)
 Virtual Visit Consent   Kristi Willis, you are scheduled for a virtual visit with a [facility] provider today. Just as with appointments in the office, your consent must be obtained to participate. Your consent will be active for this visit and any virtual visit you may have with one of our providers in the next 365 days. If you have a MyChart account, a copy of this consent can be sent to you electronically.  As this is a virtual visit, video technology does not allow for your provider to perform a traditional examination. This may limit your provider's ability to fully assess your condition. If your provider identifies any concerns that need to be evaluated in person or the need to arrange testing (such as labs, EKG, etc.), we will make arrangements to do so. Although advances in technology are sophisticated, we cannot ensure that it will always work on either your end or our end. If the connection with a video visit is poor, the visit may have to be switched to a telephone visit. With either a video or telephone visit, we are not always able to ensure that we have a secure connection.  By engaging in this virtual visit, you consent to the provision of healthcare and authorize for your insurance to be billed (if applicable) for the services provided during this visit. Depending on your insurance coverage, you may receive a charge related to this service.  I need to obtain your verbal consent now. Are you willing to proceed with your visit today? Kristi Willis has provided verbal consent on 10/07/2023 for a virtual visit (video or telephone). Kristi Rodney, FNP  Date: 10/07/2023 1:07 PM   Virtual Visit via Video Note   I, Kristi Willis, connected with  Kristi Willis  ([id number], Kristi Willis) on 10/07/23 at  1:40 PM EST by a video-enabled telemedicine application and verified that I am speaking with the correct person using two identifiers.  Location: Patient: Virtual Visit  Location Patient: Other: at a restaurant  Provider: Virtual Visit Location Provider: Home Office   I discussed the limitations of evaluation and management by telemedicine and the availability of in person appointments. The patient expressed understanding and agreed to proceed.    History of Present Illness: Kristi Willis is a 83 y.o. who identifies as a female who was assigned female at birth, and is being seen today for chronic follow up. She has aphasia so her daughter is doing most of the talking for her today.    She is followed by podiatry every 6 weeks. She is followed by Cardiologists annually. She is getting allergy injections weekly.    Daughter reports increased weakness, requesting PT referral.    Has aortic atherosclerosis and takes Crestor daily.Marland Kitchen  HPI: Hypertension This is a chronic problem. The current episode started more than 1 year ago. The problem has been resolved since onset. The problem is controlled. Associated symptoms include anxiety. Pertinent negatives include no blurred vision, malaise/fatigue, peripheral edema or shortness of breath. The current treatment provides moderate improvement. Identifiable causes of hypertension include a thyroid problem.  Asthma She complains of wheezing. There is no cough, frequent throat clearing or shortness of breath. This is a chronic problem. The current episode started more than 1 year ago. The problem occurs intermittently. Pertinent negatives include no malaise/fatigue. Her past medical history is significant for asthma.  Thyroid Problem Presents for follow-up visit. Symptoms include anxiety, depressed mood and diarrhea. Patient reports no constipation, diaphoresis or fatigue.  The symptoms have been stable. Her past medical history is significant for hyperlipidemia.  Hyperlipidemia This is a chronic problem. The current episode started more than 1 year ago. The problem is controlled. Recent lipid tests were reviewed and are  normal. Pertinent negatives include no shortness of breath. Current antihyperlipidemic treatment includes statins. The current treatment provides moderate improvement of lipids. Risk factors for coronary artery disease include diabetes mellitus, dyslipidemia and hypertension.  Diabetes She presents for her follow-up diabetic visit. She has type 2 diabetes mellitus. Hypoglycemia symptoms include nervousness/anxiousness. Pertinent negatives for diabetes include no blurred vision, no fatigue and no foot paresthesias. Risk factors for coronary artery disease include diabetes mellitus, dyslipidemia, hypertension and sedentary lifestyle. She is following a generally healthy diet. (Does not check glucose at home ) Eye exam is not current.  Insomnia Primary symptoms: difficulty falling asleep, frequent awakening, no malaise/fatigue.   The current episode started more than one year. The onset quality is gradual. Past treatments include medication. The treatment provided moderate relief. PMH includes: depression.   Anxiety Presents for follow-up visit. Symptoms include depressed mood, excessive worry, insomnia and nervous/anxious behavior. Patient reports no shortness of breath.   Her past medical history is significant for asthma.  Depression        This is a chronic problem.  The current episode started more than 1 year ago.   The problem occurs intermittently.  Associated symptoms include insomnia.  Associated symptoms include no fatigue, no helplessness, no hopelessness and not sad.  Past medical history includes thyroid problem and anxiety.     Problems:  Patient Active Problem List   Diagnosis Date Noted   Allergic rhinitis 03/05/2022   Pulmonary congestion 02/11/2021   Aortic atherosclerosis (HCC) 02/11/2021   Hypo-osmolar hyponatremia 02/10/2021   Primary progressive aphasia (HCC) 03/23/2020   Pain in left shoulder 11/03/2019   Acute metabolic encephalopathy 08/05/2019   Iron deficiency anemia  08/05/2019   QT prolongation 08/05/2019   Hypothyroidism 02/12/2019   Speech articulation disorder 09/01/2018   Essential hypertension 10/03/2017   GAD (generalized anxiety disorder) 07/03/2017   Diabetes mellitus (HCC) 09/27/2016   Depression 03/29/2016   Insomnia 07/18/2015   Vitamin D deficiency 10/11/2013   Right carotid bruit 03/02/2013   Degenerative disc disease, lumbar 11/27/2012   Asthma 11/13/2010   Hyperlipidemia associated with type 2 diabetes mellitus (HCC) 11/13/2010   Cancer of skin 11/13/2010   Diabetic neuropathy (HCC) 11/13/2010   Wide-complex tachycardia==Atrial Tachycardia 11/13/2010    Allergies:  Allergies  Allergen Reactions   Dimetapp C [Phenylephrine-Bromphen-Codeine] Itching   Phenylephrine Hcl Itching   Shingrix [Zoster Vac Recomb Adjuvanted] Other (See Comments)    Swelling and fever    Ultram [Tramadol Hcl] Other (See Comments)    Insomnia   Celebrex [Celecoxib] Rash   Cortisone Rash   Fenofibrate Rash   Sulfa Antibiotics Rash   Medications:  Current Outpatient Medications:    Accu-Chek Softclix Lancets lancets, CHECK BLOOD SUGAR ONCE DAILY OR AS DIRECTED Dx E11.40, Disp: 100 each, Rfl: 3   acyclovir ointment (ZOVIRAX) 5 %, APPLY TO THE AFFECTED AREA AROUND MOUTH EVERY 3 HOURS AS DIRECTED UNTIL CLEAR, Disp: 15 g, Rfl: 0   albuterol (PROVENTIL) (2.5 MG/3ML) 0.083% nebulizer solution, Take 3 mLs (2.5 mg total) by nebulization every 6 (six) hours as needed for wheezing or shortness of breath., Disp: 150 mL, Rfl: 1   albuterol (VENTOLIN HFA) 108 (90 Base) MCG/ACT inhaler, INHALE TWO PUFFS EVERY 6 HOURS AS NEEDED FOR WHEEZING  OR SHORTNESS OF BREATH, Disp: 8.5 g, Rfl: 2   amLODipine (NORVASC) 5 MG tablet, TAKE ONE TABLET BY MOUTH DAILY, Disp: 30 tablet, Rfl: 5   aspirin 81 MG chewable tablet, Chew 81 mg by mouth at bedtime., Disp: , Rfl:    azelastine (ASTELIN) 0.1 % nasal spray, Place 2 sprays into both nostrils 2 (two) times daily., Disp: 30 mL, Rfl:  11   benzonatate (TESSALON) 200 MG capsule, Take 1 capsule (200 mg total) by mouth 3 (three) times daily as needed., Disp: 30 capsule, Rfl: 1   Blood Glucose Monitoring Suppl (ACCU-CHEK AVIVA PLUS) w/Device KIT, Check sugars daily & as needed Dx E11.9, Disp: 1 kit, Rfl: 0   cetirizine (ZYRTEC) 10 MG tablet, TAKE ONE TABLET DAILY, Disp: 90 tablet, Rfl: 1   Coenzyme Q10 (COQ10 PO), Take 1 tablet by mouth daily., Disp: , Rfl:    doxazosin (CARDURA) 8 MG tablet, TAKE (1) TABLET DAILY FOR HIGH BLOOD PRESSURE., Disp: 90 tablet, Rfl: 2   doxycycline (VIBRA-TABS) 100 MG tablet, Take 1 tablet (100 mg total) by mouth 2 (two) times daily., Disp: 20 tablet, Rfl: 0   EPIPEN 2-PAK 0.3 MG/0.3ML SOAJ injection, Inject 0.3 mg into the muscle as needed for anaphylaxis. Reported on 01/24/2016, Disp: , Rfl:    escitalopram (LEXAPRO) 20 MG tablet, TAKE ONE TABLET BY MOUTH DAILY, Disp: 30 tablet, Rfl: 5   glucose blood (ACCU-CHEK AVIVA PLUS) test strip, CHECK BLOOD SUGAR ONCE DAILY OR AS DIRECTED Dx E11.40, Disp: 100 strip, Rfl: 3   ipratropium-albuterol (DUONEB) 0.5-2.5 (3) MG/3ML SOLN, Take 3 mLs by nebulization 2 (two) times daily., Disp: 125 mL, Rfl: 0   JANUVIA 50 MG tablet, TAKE ONE TABLET BY MOUTH DAILY, Disp: 30 tablet, Rfl: 5   ketoconazole (NIZORAL) 2 % cream, Apply 1 application topically daily., Disp: 30 g, Rfl: 0   levothyroxine (SYNTHROID) 75 MCG tablet, TAKE ONE TABLET DAILY BEFORE BREAKFAST, Disp: 90 tablet, Rfl: 1   lisinopril (ZESTRIL) 40 MG tablet, TAKE ONE TABLET DAILY, Disp: 90 tablet, Rfl: 0   loratadine (CLARITIN) 10 MG tablet, Take 1 tablet by mouth daily., Disp: , Rfl:    metFORMIN (GLUCOPHAGE) 1000 MG tablet, TAKE  (1)  TABLET TWICE A DAY WITH MEALS (BREAKFAST AND SUPPER), Disp: 180 tablet, Rfl: 3   metoprolol succinate (TOPROL-XL) 100 MG 24 hr tablet, TAKE ONE TABLET DAILY, Disp: 90 tablet, Rfl: 0   montelukast (SINGULAIR) 10 MG tablet, TAKE ONE TABLET AT BEDTIME, Disp: 30 tablet, Rfl: 5    predniSONE (STERAPRED UNI-PAK 21 TAB) 10 MG (21) TBPK tablet, Use as directed, Disp: 21 tablet, Rfl: 0   rosuvastatin (CRESTOR) 20 MG tablet, Take 1 tablet (20 mg total) by mouth at bedtime., Disp: 90 tablet, Rfl: 3  Observations/Objective: Patient is well-developed, well-nourished in no acute distress.  Resting comfortably .  Head is normocephalic, atraumatic.  No labored breathing.  Aphasia, daughter doing most of the speaking. Pt writes down on paper her answers.  Patient is alert and oriented at baseline.    Assessment and Plan: 1. Aortic atherosclerosis (HCC) - CMP14+EGFR; Future  2. Mild intermittent asthma without complication - CMP14+EGFR; Future  3. Moderate episode of recurrent major depressive disorder (HCC) - CMP14+EGFR; Future  4. Type 2 diabetes mellitus with diabetic polyneuropathy, without long-term current use of insulin (HCC) (Primary) - Bayer DCA Hb A1c Waived; Future - CMP14+EGFR; Future  5. Essential hypertension - CMP14+EGFR; Future  6. GAD (generalized anxiety disorder) - CMP14+EGFR; Future  7.  Hyperlipidemia associated with type 2 diabetes mellitus (HCC) - CMP14+EGFR; Future  8. Hypothyroidism, unspecified type - CMP14+EGFR; Future  9. Insomnia, unspecified type - CMP14+EGFR; Future  10. Primary progressive aphasia (HCC) - CMP14+EGFR; Future  Continue current medications  Keep follow up with specialists  Encourage healthy diet and exercise  Follow up in 4 months   Follow Up Instructions: I discussed the assessment and treatment plan with the patient. The patient was provided an opportunity to ask questions and all were answered. The patient agreed with the plan and demonstrated an understanding of the instructions.  A copy of instructions were sent to the patient via MyChart unless otherwise noted below.     The patient was advised to call back or seek an in-person evaluation if the symptoms worsen or if the condition fails to improve as  anticipated.    Kristi Rodney, FNP

## 2023-10-10 DIAGNOSIS — J301 Allergic rhinitis due to pollen: Secondary | ICD-10-CM | POA: Diagnosis not present

## 2023-10-10 DIAGNOSIS — J3081 Allergic rhinitis due to animal (cat) (dog) hair and dander: Secondary | ICD-10-CM | POA: Diagnosis not present

## 2023-10-10 DIAGNOSIS — J3089 Other allergic rhinitis: Secondary | ICD-10-CM | POA: Diagnosis not present

## 2023-10-17 DIAGNOSIS — J301 Allergic rhinitis due to pollen: Secondary | ICD-10-CM | POA: Diagnosis not present

## 2023-10-17 DIAGNOSIS — J3089 Other allergic rhinitis: Secondary | ICD-10-CM | POA: Diagnosis not present

## 2023-10-17 DIAGNOSIS — J3081 Allergic rhinitis due to animal (cat) (dog) hair and dander: Secondary | ICD-10-CM | POA: Diagnosis not present

## 2023-10-24 DIAGNOSIS — J301 Allergic rhinitis due to pollen: Secondary | ICD-10-CM | POA: Diagnosis not present

## 2023-10-24 DIAGNOSIS — J3089 Other allergic rhinitis: Secondary | ICD-10-CM | POA: Diagnosis not present

## 2023-10-24 DIAGNOSIS — J3081 Allergic rhinitis due to animal (cat) (dog) hair and dander: Secondary | ICD-10-CM | POA: Diagnosis not present

## 2023-10-31 ENCOUNTER — Telehealth: Payer: Self-pay | Admitting: Family Medicine

## 2023-10-31 NOTE — Telephone Encounter (Signed)
 Copied from CRM [id number]. Topic: Clinical - Medical Advice >> Oct 31, 2023 12:27 PM Gery Pray wrote: Reason for CRM: Boneta Lucks daughter called requesting to speak with Dr. Jannifer Rodney' nurse regarding her mom's (patient) face swelling. Boneta Lucks is wanting some guidance on what she should take with her medication that she is on or medical advice. Boneta Lucks can be reached at [phone]

## 2023-10-31 NOTE — Telephone Encounter (Signed)
 Spoke with daughter mother has no other symptoms then a little facial swelling. No SOB or itching advise to give a benadryl and cool compress if  it got worse to go to urgent care.  FYI

## 2023-11-06 DIAGNOSIS — J3089 Other allergic rhinitis: Secondary | ICD-10-CM | POA: Diagnosis not present

## 2023-11-06 DIAGNOSIS — J301 Allergic rhinitis due to pollen: Secondary | ICD-10-CM | POA: Diagnosis not present

## 2023-11-06 DIAGNOSIS — J3081 Allergic rhinitis due to animal (cat) (dog) hair and dander: Secondary | ICD-10-CM | POA: Diagnosis not present

## 2023-11-14 DIAGNOSIS — J3081 Allergic rhinitis due to animal (cat) (dog) hair and dander: Secondary | ICD-10-CM | POA: Diagnosis not present

## 2023-11-14 DIAGNOSIS — J301 Allergic rhinitis due to pollen: Secondary | ICD-10-CM | POA: Diagnosis not present

## 2023-11-14 DIAGNOSIS — J3089 Other allergic rhinitis: Secondary | ICD-10-CM | POA: Diagnosis not present

## 2023-11-18 ENCOUNTER — Encounter (HOSPITAL_BASED_OUTPATIENT_CLINIC_OR_DEPARTMENT_OTHER): Payer: Self-pay | Admitting: Emergency Medicine

## 2023-11-18 ENCOUNTER — Emergency Department (HOSPITAL_BASED_OUTPATIENT_CLINIC_OR_DEPARTMENT_OTHER)

## 2023-11-18 ENCOUNTER — Inpatient Hospital Stay (HOSPITAL_BASED_OUTPATIENT_CLINIC_OR_DEPARTMENT_OTHER)
Admission: EM | Admit: 2023-11-18 | Discharge: 2023-11-23 | DRG: 689 | Disposition: A | Discharge status: Home / Self Care | Attending: Internal Medicine | Admitting: Internal Medicine

## 2023-11-18 ENCOUNTER — Other Ambulatory Visit: Payer: Self-pay

## 2023-11-18 ENCOUNTER — Emergency Department (HOSPITAL_BASED_OUTPATIENT_CLINIC_OR_DEPARTMENT_OTHER): Admitting: Radiology

## 2023-11-18 DIAGNOSIS — E039 Hypothyroidism, unspecified: Secondary | ICD-10-CM | POA: Diagnosis present

## 2023-11-18 DIAGNOSIS — F411 Generalized anxiety disorder: Secondary | ICD-10-CM

## 2023-11-18 DIAGNOSIS — E7800 Pure hypercholesterolemia, unspecified: Secondary | ICD-10-CM | POA: Diagnosis present

## 2023-11-18 DIAGNOSIS — Z823 Family history of stroke: Secondary | ICD-10-CM

## 2023-11-18 DIAGNOSIS — F331 Major depressive disorder, recurrent, moderate: Secondary | ICD-10-CM

## 2023-11-18 DIAGNOSIS — J90 Pleural effusion, not elsewhere classified: Secondary | ICD-10-CM | POA: Diagnosis not present

## 2023-11-18 DIAGNOSIS — D509 Iron deficiency anemia, unspecified: Secondary | ICD-10-CM | POA: Diagnosis present

## 2023-11-18 DIAGNOSIS — B9729 Other coronavirus as the cause of diseases classified elsewhere: Secondary | ICD-10-CM | POA: Diagnosis present

## 2023-11-18 DIAGNOSIS — Z8249 Family history of ischemic heart disease and other diseases of the circulatory system: Secondary | ICD-10-CM | POA: Diagnosis not present

## 2023-11-18 DIAGNOSIS — R0902 Hypoxemia: Secondary | ICD-10-CM | POA: Diagnosis present

## 2023-11-18 DIAGNOSIS — R509 Fever, unspecified: Secondary | ICD-10-CM

## 2023-11-18 DIAGNOSIS — F0280 Dementia in other diseases classified elsewhere without behavioral disturbance: Secondary | ICD-10-CM | POA: Diagnosis present

## 2023-11-18 DIAGNOSIS — J45909 Unspecified asthma, uncomplicated: Secondary | ICD-10-CM | POA: Diagnosis present

## 2023-11-18 DIAGNOSIS — I10 Essential (primary) hypertension: Secondary | ICD-10-CM | POA: Diagnosis not present

## 2023-11-18 DIAGNOSIS — J069 Acute upper respiratory infection, unspecified: Secondary | ICD-10-CM | POA: Diagnosis present

## 2023-11-18 DIAGNOSIS — R531 Weakness: Secondary | ICD-10-CM | POA: Diagnosis not present

## 2023-11-18 DIAGNOSIS — Z7982 Long term (current) use of aspirin: Secondary | ICD-10-CM

## 2023-11-18 DIAGNOSIS — Z85828 Personal history of other malignant neoplasm of skin: Secondary | ICD-10-CM

## 2023-11-18 DIAGNOSIS — F800 Phonological disorder: Secondary | ICD-10-CM | POA: Diagnosis present

## 2023-11-18 DIAGNOSIS — R059 Cough, unspecified: Secondary | ICD-10-CM | POA: Diagnosis not present

## 2023-11-18 DIAGNOSIS — E1142 Type 2 diabetes mellitus with diabetic polyneuropathy: Secondary | ICD-10-CM | POA: Diagnosis not present

## 2023-11-18 DIAGNOSIS — I509 Heart failure, unspecified: Secondary | ICD-10-CM | POA: Diagnosis not present

## 2023-11-18 DIAGNOSIS — J208 Acute bronchitis due to other specified organisms: Secondary | ICD-10-CM | POA: Diagnosis present

## 2023-11-18 DIAGNOSIS — I5033 Acute on chronic diastolic (congestive) heart failure: Secondary | ICD-10-CM | POA: Diagnosis not present

## 2023-11-18 DIAGNOSIS — I110 Hypertensive heart disease with heart failure: Secondary | ICD-10-CM | POA: Diagnosis present

## 2023-11-18 DIAGNOSIS — E119 Type 2 diabetes mellitus without complications: Secondary | ICD-10-CM

## 2023-11-18 DIAGNOSIS — R590 Localized enlarged lymph nodes: Secondary | ICD-10-CM | POA: Diagnosis present

## 2023-11-18 DIAGNOSIS — N390 Urinary tract infection, site not specified: Principal | ICD-10-CM | POA: Diagnosis present

## 2023-11-18 DIAGNOSIS — E1140 Type 2 diabetes mellitus with diabetic neuropathy, unspecified: Secondary | ICD-10-CM | POA: Diagnosis present

## 2023-11-18 DIAGNOSIS — Z7984 Long term (current) use of oral hypoglycemic drugs: Secondary | ICD-10-CM | POA: Diagnosis not present

## 2023-11-18 DIAGNOSIS — Z79899 Other long term (current) drug therapy: Secondary | ICD-10-CM

## 2023-11-18 DIAGNOSIS — K219 Gastro-esophageal reflux disease without esophagitis: Secondary | ICD-10-CM | POA: Diagnosis present

## 2023-11-18 DIAGNOSIS — E876 Hypokalemia: Secondary | ICD-10-CM | POA: Diagnosis not present

## 2023-11-18 DIAGNOSIS — Z79890 Hormone replacement therapy (postmenopausal): Secondary | ICD-10-CM

## 2023-11-18 DIAGNOSIS — E1169 Type 2 diabetes mellitus with other specified complication: Secondary | ICD-10-CM | POA: Diagnosis present

## 2023-11-18 DIAGNOSIS — R9431 Abnormal electrocardiogram [ECG] [EKG]: Secondary | ICD-10-CM | POA: Diagnosis not present

## 2023-11-18 DIAGNOSIS — J9811 Atelectasis: Secondary | ICD-10-CM | POA: Diagnosis not present

## 2023-11-18 DIAGNOSIS — I517 Cardiomegaly: Secondary | ICD-10-CM | POA: Diagnosis not present

## 2023-11-18 DIAGNOSIS — R0989 Other specified symptoms and signs involving the circulatory and respiratory systems: Secondary | ICD-10-CM | POA: Diagnosis not present

## 2023-11-18 DIAGNOSIS — Z602 Problems related to living alone: Secondary | ICD-10-CM | POA: Diagnosis present

## 2023-11-18 DIAGNOSIS — I2510 Atherosclerotic heart disease of native coronary artery without angina pectoris: Secondary | ICD-10-CM | POA: Diagnosis not present

## 2023-11-18 LAB — CBC WITH DIFFERENTIAL/PLATELET
Abs Immature Granulocytes: 0.07 10*3/uL (ref 0.00–0.07)
Basophils Absolute: 0 10*3/uL (ref 0.0–0.1)
Basophils Relative: 0 %
Eosinophils Absolute: 0.1 10*3/uL (ref 0.0–0.5)
Eosinophils Relative: 1 %
HCT: 27.5 % — ABNORMAL LOW (ref 36.0–46.0)
Hemoglobin: 8.8 g/dL — ABNORMAL LOW (ref 12.0–15.0)
Immature Granulocytes: 1 %
Lymphocytes Relative: 6 %
Lymphs Abs: 0.8 10*3/uL (ref 0.7–4.0)
MCH: 26 pg (ref 26.0–34.0)
MCHC: 32 g/dL (ref 30.0–36.0)
MCV: 81.4 fL (ref 80.0–100.0)
Monocytes Absolute: 0.8 10*3/uL (ref 0.1–1.0)
Monocytes Relative: 6 %
Neutro Abs: 12.1 10*3/uL — ABNORMAL HIGH (ref 1.7–7.7)
Neutrophils Relative %: 86 %
Platelets: 219 10*3/uL (ref 150–400)
RBC: 3.38 MIL/uL — ABNORMAL LOW (ref 3.87–5.11)
RDW: 14.3 % (ref 11.5–15.5)
WBC: 13.9 10*3/uL — ABNORMAL HIGH (ref 4.0–10.5)
nRBC: 0 % (ref 0.0–0.2)

## 2023-11-18 LAB — PROTIME-INR
INR: 1.1 (ref 0.8–1.2)
Prothrombin Time: 14.6 s (ref 11.4–15.2)

## 2023-11-18 LAB — COMPREHENSIVE METABOLIC PANEL WITH GFR
ALT: 10 U/L (ref 0–44)
AST: 12 U/L — ABNORMAL LOW (ref 15–41)
Albumin: 4.1 g/dL (ref 3.5–5.0)
Alkaline Phosphatase: 74 U/L (ref 38–126)
Anion gap: 12 (ref 5–15)
BUN: 13 mg/dL (ref 8–23)
CO2: 24 mmol/L (ref 22–32)
Calcium: 8.9 mg/dL (ref 8.9–10.3)
Chloride: 101 mmol/L (ref 98–111)
Creatinine, Ser: 0.56 mg/dL (ref 0.44–1.00)
GFR, Estimated: >60 mL/min (ref >60)
Glucose, Bld: 178 mg/dL — ABNORMAL HIGH (ref 70–99)
Potassium: 3.9 mmol/L (ref 3.5–5.1)
Sodium: 137 mmol/L (ref 135–145)
Total Bilirubin: 0.8 mg/dL (ref 0.0–1.2)
Total Protein: 6.7 g/dL (ref 6.5–8.1)

## 2023-11-18 LAB — LACTIC ACID, PLASMA: Lactic Acid, Venous: 1.7 mmol/L (ref 0.5–1.9)

## 2023-11-18 LAB — RESP PANEL BY RT-PCR (RSV, FLU A&B, COVID)  RVPGX2
Influenza A by PCR: NEGATIVE
Influenza B by PCR: NEGATIVE
Resp Syncytial Virus by PCR: NEGATIVE
SARS Coronavirus 2 by RT PCR: NEGATIVE

## 2023-11-18 LAB — CBG MONITORING, ED: Glucose-Capillary: 149 mg/dL — ABNORMAL HIGH (ref 70–99)

## 2023-11-18 LAB — BRAIN NATRIURETIC PEPTIDE: B Natriuretic Peptide: 151.8 pg/mL — ABNORMAL HIGH (ref 0.0–100.0)

## 2023-11-18 MED ORDER — SODIUM CHLORIDE 0.9 % IV SOLN
2.0000 g | Freq: Once | INTRAVENOUS | Status: AC
  Administered 2023-11-18: 2 g via INTRAVENOUS
  Filled 2023-11-18: qty 20

## 2023-11-18 MED ORDER — SODIUM CHLORIDE 0.9 % IV SOLN
500.0000 mg | Freq: Once | INTRAVENOUS | Status: AC
  Administered 2023-11-18: 500 mg via INTRAVENOUS
  Filled 2023-11-18: qty 5

## 2023-11-18 MED ORDER — IOHEXOL 350 MG/ML SOLN
100.0000 mL | Freq: Once | INTRAVENOUS | Status: AC | PRN
  Administered 2023-11-18: 100 mL via INTRAVENOUS

## 2023-11-18 MED ORDER — ACETAMINOPHEN 500 MG PO TABS
1000.0000 mg | ORAL_TABLET | Freq: Once | ORAL | Status: AC
  Administered 2023-11-18: 1000 mg via ORAL
  Filled 2023-11-18: qty 2

## 2023-11-18 NOTE — ED Notes (Signed)
 Patient to CT via stretcher.

## 2023-11-18 NOTE — ED Provider Notes (Signed)
 [place] EMERGENCY DEPARTMENT AT [facility] Provider Note   CSN: [id number] Arrival date & time: 11/18/23  1641     History {Add pertinent medical, surgical, social history, OB history to HPI:1} Chief Complaint  Patient presents with   Fever    Kristi Willis is a 83 y.o. female.  83 year old female with a history of primary aphasia, diabetes, and hypertension who presents emergency department with generalized weakness and cough.  Patient has had a cough for several days.  Went to the dentist for dental cleaning today.  No dental infections are noted.  After going home from the cleaning her daughter reports that she gradually got more more weak.  Started to feel feverish.  Decided to bring her into the emergency department for evaluation.  Cough has been nonproductive.  No known sick contacts.  No nausea or vomiting or diarrhea.  Lives at home by herself.       Home Medications Prior to Admission medications   Medication Sig Start Date End Date Taking? Authorizing Provider  Accu-Chek Softclix Lancets lancets CHECK BLOOD SUGAR ONCE DAILY OR AS DIRECTED Dx E11.40 11/23/20   Jannifer Rodney A, FNP  acyclovir ointment (ZOVIRAX) 5 % APPLY TO THE AFFECTED AREA AROUND MOUTH EVERY 3 HOURS AS DIRECTED UNTIL CLEAR 05/31/19   Hawks, Christy A, FNP  albuterol (PROVENTIL) (2.5 MG/3ML) 0.083% nebulizer solution Take 3 mLs (2.5 mg total) by nebulization every 6 (six) hours as needed for wheezing or shortness of breath. 10/05/21   Jannifer Rodney A, FNP  albuterol (VENTOLIN HFA) 108 (90 Base) MCG/ACT inhaler INHALE TWO PUFFS EVERY 6 HOURS AS NEEDED FOR WHEEZING OR SHORTNESS OF BREATH 09/08/23   Jannifer Rodney A, FNP  amLODipine (NORVASC) 5 MG tablet TAKE ONE TABLET BY MOUTH DAILY 06/24/23   Jannifer Rodney A, FNP  aspirin 81 MG chewable tablet Chew 81 mg by mouth at bedtime.    [provider]  azelastine (ASTELIN) 0.1 % nasal spray Place 2 sprays into both nostrils 2 (two) times  daily. 08/24/14   Deatra Canter, FNP  benzonatate (TESSALON) 200 MG capsule Take 1 capsule (200 mg total) by mouth 3 (three) times daily as needed. 06/12/23   Jannifer Rodney A, FNP  Blood Glucose Monitoring Suppl (ACCU-CHEK AVIVA PLUS) w/Device KIT Check sugars daily & as needed Dx E11.9 08/11/19   Junie Spencer, FNP  cetirizine (ZYRTEC) 10 MG tablet TAKE ONE TABLET DAILY 04/09/23   Jannifer Rodney A, FNP  Coenzyme Q10 (COQ10 PO) Take 1 tablet by mouth daily.    [provider]  doxazosin (CARDURA) 8 MG tablet TAKE (1) TABLET DAILY FOR HIGH BLOOD PRESSURE. 11/12/22   Jannifer Rodney A, FNP  doxycycline (VIBRA-TABS) 100 MG tablet Take 1 tablet (100 mg total) by mouth 2 (two) times daily. 06/26/23   Hawks, Neysa Bonito A, FNP  EPIPEN 2-PAK 0.3 MG/0.3ML SOAJ injection Inject 0.3 mg into the muscle as needed for anaphylaxis. Reported on 01/24/2016 03/07/14   [provider]  escitalopram (LEXAPRO) 20 MG tablet TAKE ONE TABLET BY MOUTH DAILY 06/24/23   Jannifer Rodney A, FNP  glucose blood (ACCU-CHEK AVIVA PLUS) test strip CHECK BLOOD SUGAR ONCE DAILY OR AS DIRECTED Dx E11.40 11/23/20   Jannifer Rodney A, FNP  ipratropium-albuterol (DUONEB) 0.5-2.5 (3) MG/3ML SOLN Take 3 mLs by nebulization 2 (two) times daily. 08/07/19   Myrtie Neither, MD  JANUVIA 50 MG tablet TAKE ONE TABLET BY MOUTH DAILY 06/24/23   Junie Spencer, FNP  ketoconazole (  NIZORAL) 2 % cream Apply 1 application topically daily. 05/08/21   Junie Spencer, FNP  levothyroxine (SYNTHROID) 75 MCG tablet TAKE ONE TABLET DAILY BEFORE BREAKFAST 07/03/23   Jannifer Rodney A, FNP  lisinopril (ZESTRIL) 40 MG tablet TAKE ONE TABLET DAILY 09/18/23   Jannifer Rodney A, FNP  loratadine (CLARITIN) 10 MG tablet Take 1 tablet by mouth daily. 06/21/21   [provider]  metFORMIN (GLUCOPHAGE) 1000 MG tablet TAKE  (1)  TABLET TWICE A DAY WITH MEALS (BREAKFAST AND SUPPER) 11/12/22   Jannifer Rodney A, FNP  metoprolol succinate (TOPROL-XL) 100 MG 24  hr tablet TAKE ONE TABLET DAILY 09/18/23   Jannifer Rodney A, FNP  montelukast (SINGULAIR) 10 MG tablet TAKE ONE TABLET AT BEDTIME 06/24/23   Jannifer Rodney A, FNP  predniSONE (STERAPRED UNI-PAK 21 TAB) 10 MG (21) TBPK tablet Use as directed 06/26/23   Jannifer Rodney A, FNP  rosuvastatin (CRESTOR) 20 MG tablet Take 1 tablet (20 mg total) by mouth at bedtime. 11/12/22 11/12/23  Junie Spencer, FNP      Allergies    Dimetapp c [phenylephrine-bromphen-codeine], Phenylephrine hcl, Shingrix [zoster vac recomb adjuvanted], Ultram [tramadol hcl], Celebrex [celecoxib], Cortisone, Fenofibrate, and Sulfa antibiotics    Review of Systems   Review of Systems  Physical Exam Updated Vital Signs BP (!) 156/47   Pulse 77   Temp (!) 101.3 F (38.5 C) (Oral)   Resp (!) 24   SpO2 90%  Physical Exam Vitals and nursing note reviewed.  Constitutional:      General: She is not in acute distress.    Appearance: She is well-developed.  HENT:     Head: Normocephalic and atraumatic.     Right Ear: External ear normal.     Left Ear: External ear normal.     Nose: Nose normal.     Mouth/Throat:     Comments: Poor dentition but no abscesses visualized Eyes:     Extraocular Movements: Extraocular movements intact.     Conjunctiva/sclera: Conjunctivae normal.     Pupils: Pupils are equal, round, and reactive to light.  Cardiovascular:     Rate and Rhythm: Normal rate and regular rhythm.     Heart sounds: No murmur heard. Pulmonary:     Effort: Pulmonary effort is normal. No respiratory distress.     Breath sounds: Normal breath sounds.  Musculoskeletal:     Cervical back: Normal range of motion and neck supple.     Right lower leg: Edema present.     Left lower leg: Edema present.  Skin:    General: Skin is warm and dry.  Neurological:     Mental Status: She is alert and oriented to person, place, and time. Mental status is at baseline.  Psychiatric:        Mood and Affect: Mood normal.     ED  Results / Procedures / Treatments   Labs (all labs ordered are listed, but only abnormal results are displayed) Labs Reviewed  CULTURE, BLOOD (ROUTINE X 2)  CULTURE, BLOOD (ROUTINE X 2)  RESP PANEL BY RT-PCR (RSV, FLU A&B, COVID)  [id number]  COMPREHENSIVE METABOLIC PANEL WITH GFR  LACTIC ACID, PLASMA  LACTIC ACID, PLASMA  CBC WITH DIFFERENTIAL/PLATELET  PROTIME-INR  URINALYSIS, W/ REFLEX TO CULTURE (INFECTION SUSPECTED)    EKG None  Radiology No results found.  Procedures Procedures  {Document cardiac monitor, telemetry assessment procedure when appropriate:1}  Medications Ordered in ED Medications - No data to display  ED Course/  Medical Decision Making/ A&P   {   Click here for ABCD2, HEART and other calculatorsREFRESH Note before signing :1}                              Medical Decision Making Amount and/or Complexity of Data Reviewed Labs: ordered. Radiology: ordered.   ***  {Document critical care time when appropriate:1} {Document review of labs and clinical decision tools ie heart score, Chads2Vasc2 etc:1}  {Document your independent review of radiology images, and any outside records:1} {Document your discussion with family members, caretakers, and with consultants:1} {Document social determinants of health affecting pt's care:1} {Document your decision making why or why not admission, treatments were needed:1} Final Clinical Impression(s) / ED Diagnoses Final diagnoses:  None    Rx / DC Orders ED Discharge Orders     None

## 2023-11-18 NOTE — ED Notes (Signed)
 Warm blankets given, repositioned in the bed.

## 2023-11-18 NOTE — ED Notes (Signed)
 Attempt to obtain UA, unsuccessful with I & O cath due to prolapse. Up to [facility] but stool also in specimen.

## 2023-11-18 NOTE — ED Triage Notes (Signed)
 Fever, weakness and disorientation/ confusion Cough Noticed yesterday getting worse today  Hx of aphasia

## 2023-11-18 NOTE — Plan of Care (Signed)
 Plan of Care Note for accepted transfer  Patient: Kristi Willis              ZOX:[id number]  DOA: 11/18/2023     Facility requesting transfer: [facility] emergency department Requesting Provider: Dr. Jarold Motto  Reason for transfer: Upper respite tract infection  ED triage note:Fever, weakness and disorientation/ confusion Cough. Noticed yesterday getting worse today  Facility course: 83 year old female history of asthma, DM type II, hypothyroidism, essential hypertension, speech articulation disorder/aphasia, anemia of chronic disease, and hyperlipidemia presented to emergency department complaining of fever, generalized weakness, cough and disorientation.  Patient has had a cough for several days.  Went to the dentist for dental cleaning today.  No dental infections are noted.  After going home from the cleaning her daughter reports that she gradually got more more weak.  Started to feel feverish.  Decided to bring her into the emergency department for evaluation.  Cough has been nonproductive.  No known sick contacts.  No nausea or vomiting or diarrhea.  Lives at home by herself.   At presentation to ED patient found febrile, O2 sat 88% on room air currently 98% on 2 L, heart rate variable between 56-68, respiratory rate variable 20-23. Respiratory panel negative for COVID, RSV flu. CBC showing leukocytosis 13.8.  Hemoglobin 8.8.  Baseline hemoglobin around 9-10.  Low MCV 27.  Normal platelet count 219. Lactic acid within normal range. Blood cultures are pending.  Pending UA. CMP unremarkable. BNP slightly up at 152.  Chest x-ray no active disease process. CTA chest no evidence of PE.  CAD.  Mediastinal lymphadenopathy.  Small right pleural effusion.  As as the patient has fever and complaining about cough has been treated empirically with IV ceftriaxone azithromycin however chest x-ray is not showing any evidence of pneumonia except a small right pleural effusion.  Patient has been  having new requirement of oxygen O2 sat dropped to 88% room air currently 98% on 2 L.  Sickle exam no evidence of wheezing, rhonchi rales.  Patient is very frail and at baseline aphasia and poor historian. Patient will need observation for worsening shortness of breath/fever and cough. Concern for development of URI.  Will check 20 respiratory panel.  Hospitalist has been consulted for further evaluation management of of upper respite tract infection.   Plan of care: The patient is accepted for admission for observation status to Telemetry unit, at [facility]  [facility] will assume care on arrival to accepting facility. Until arrival, care as per EDP. However, [facility] available 24/7 for questions and assistance.   Check [email or web address] for on-call coverage.   Nursing staff, please call [facility] Admits & Consults System-Wide number under Amion on patient's arrival so appropriate admitting provider can evaluate the pt.    Author: Tereasa Coop, MD  11/18/2023  [facility]

## 2023-11-19 ENCOUNTER — Other Ambulatory Visit

## 2023-11-19 ENCOUNTER — Ambulatory Visit: Admitting: Family Medicine

## 2023-11-19 ENCOUNTER — Observation Stay (HOSPITAL_COMMUNITY)

## 2023-11-19 ENCOUNTER — Encounter (HOSPITAL_COMMUNITY): Payer: Self-pay | Admitting: Internal Medicine

## 2023-11-19 DIAGNOSIS — J45909 Unspecified asthma, uncomplicated: Secondary | ICD-10-CM | POA: Diagnosis present

## 2023-11-19 DIAGNOSIS — E039 Hypothyroidism, unspecified: Secondary | ICD-10-CM | POA: Diagnosis present

## 2023-11-19 DIAGNOSIS — I10 Essential (primary) hypertension: Secondary | ICD-10-CM | POA: Diagnosis not present

## 2023-11-19 DIAGNOSIS — R509 Fever, unspecified: Secondary | ICD-10-CM | POA: Diagnosis present

## 2023-11-19 DIAGNOSIS — R9431 Abnormal electrocardiogram [ECG] [EKG]: Secondary | ICD-10-CM

## 2023-11-19 DIAGNOSIS — Z602 Problems related to living alone: Secondary | ICD-10-CM | POA: Diagnosis present

## 2023-11-19 DIAGNOSIS — Z85828 Personal history of other malignant neoplasm of skin: Secondary | ICD-10-CM | POA: Diagnosis not present

## 2023-11-19 DIAGNOSIS — R0902 Hypoxemia: Secondary | ICD-10-CM | POA: Diagnosis present

## 2023-11-19 DIAGNOSIS — I517 Cardiomegaly: Secondary | ICD-10-CM | POA: Diagnosis not present

## 2023-11-19 DIAGNOSIS — E1140 Type 2 diabetes mellitus with diabetic neuropathy, unspecified: Secondary | ICD-10-CM | POA: Diagnosis present

## 2023-11-19 DIAGNOSIS — F0280 Dementia in other diseases classified elsewhere without behavioral disturbance: Secondary | ICD-10-CM | POA: Diagnosis present

## 2023-11-19 DIAGNOSIS — N390 Urinary tract infection, site not specified: Secondary | ICD-10-CM | POA: Diagnosis present

## 2023-11-19 DIAGNOSIS — E1169 Type 2 diabetes mellitus with other specified complication: Secondary | ICD-10-CM | POA: Diagnosis present

## 2023-11-19 DIAGNOSIS — E1142 Type 2 diabetes mellitus with diabetic polyneuropathy: Secondary | ICD-10-CM

## 2023-11-19 DIAGNOSIS — I5033 Acute on chronic diastolic (congestive) heart failure: Secondary | ICD-10-CM | POA: Diagnosis not present

## 2023-11-19 DIAGNOSIS — F800 Phonological disorder: Secondary | ICD-10-CM | POA: Diagnosis present

## 2023-11-19 DIAGNOSIS — E876 Hypokalemia: Secondary | ICD-10-CM | POA: Diagnosis not present

## 2023-11-19 DIAGNOSIS — Z7984 Long term (current) use of oral hypoglycemic drugs: Secondary | ICD-10-CM | POA: Diagnosis not present

## 2023-11-19 DIAGNOSIS — Z8249 Family history of ischemic heart disease and other diseases of the circulatory system: Secondary | ICD-10-CM | POA: Diagnosis not present

## 2023-11-19 DIAGNOSIS — J208 Acute bronchitis due to other specified organisms: Secondary | ICD-10-CM | POA: Diagnosis present

## 2023-11-19 DIAGNOSIS — I110 Hypertensive heart disease with heart failure: Secondary | ICD-10-CM | POA: Diagnosis present

## 2023-11-19 DIAGNOSIS — R531 Weakness: Secondary | ICD-10-CM

## 2023-11-19 DIAGNOSIS — E7800 Pure hypercholesterolemia, unspecified: Secondary | ICD-10-CM | POA: Diagnosis present

## 2023-11-19 DIAGNOSIS — Z7982 Long term (current) use of aspirin: Secondary | ICD-10-CM | POA: Diagnosis not present

## 2023-11-19 DIAGNOSIS — Z79890 Hormone replacement therapy (postmenopausal): Secondary | ICD-10-CM | POA: Diagnosis not present

## 2023-11-19 DIAGNOSIS — K219 Gastro-esophageal reflux disease without esophagitis: Secondary | ICD-10-CM | POA: Diagnosis present

## 2023-11-19 DIAGNOSIS — B9729 Other coronavirus as the cause of diseases classified elsewhere: Secondary | ICD-10-CM | POA: Diagnosis present

## 2023-11-19 DIAGNOSIS — J9811 Atelectasis: Secondary | ICD-10-CM | POA: Diagnosis not present

## 2023-11-19 DIAGNOSIS — Z79899 Other long term (current) drug therapy: Secondary | ICD-10-CM | POA: Diagnosis not present

## 2023-11-19 DIAGNOSIS — R590 Localized enlarged lymph nodes: Secondary | ICD-10-CM | POA: Diagnosis present

## 2023-11-19 DIAGNOSIS — D509 Iron deficiency anemia, unspecified: Secondary | ICD-10-CM | POA: Diagnosis present

## 2023-11-19 DIAGNOSIS — I509 Heart failure, unspecified: Secondary | ICD-10-CM | POA: Diagnosis not present

## 2023-11-19 LAB — ECHOCARDIOGRAM COMPLETE
AR max vel: 2.68 cm2
AV Area VTI: 3.03 cm2
AV Area mean vel: 2.68 cm2
AV Mean grad: 5 mmHg
AV Peak grad: 8.9 mmHg
Ao pk vel: 1.49 m/s
Area-P 1/2: 4.31 cm2
Height: 64 in
MV VTI: 2.62 cm2
S' Lateral: 3.2 cm
Weight: 2638.47 [oz_av]

## 2023-11-19 LAB — RESPIRATORY PANEL BY PCR

## 2023-11-19 LAB — GLUCOSE, CAPILLARY
Glucose-Capillary: 169 mg/dL — ABNORMAL HIGH (ref 70–99)
Glucose-Capillary: 230 mg/dL — ABNORMAL HIGH (ref 70–99)
Glucose-Capillary: 155 mg/dL — ABNORMAL HIGH (ref 70–99)
Glucose-Capillary: 257 mg/dL — ABNORMAL HIGH (ref 70–99)

## 2023-11-19 LAB — RETICULOCYTES
Immature Retic Fract: 23.9 % — ABNORMAL HIGH (ref 2.3–15.9)
RBC.: 3.33 MIL/uL — ABNORMAL LOW (ref 3.87–5.11)
Retic Count, Absolute: 65.9 10*3/uL (ref 19.0–186.0)
Retic Ct Pct: 2 % (ref 0.4–3.1)

## 2023-11-19 LAB — CBC WITH DIFFERENTIAL/PLATELET
Abs Immature Granulocytes: 0.04 10*3/uL (ref 0.00–0.07)
Basophils Absolute: 0 10*3/uL (ref 0.0–0.1)
Basophils Relative: 0 %
Eosinophils Absolute: 0.1 10*3/uL (ref 0.0–0.5)
Eosinophils Relative: 1 %
HCT: 30.1 % — ABNORMAL LOW (ref 36.0–46.0)
Hemoglobin: 9.2 g/dL — ABNORMAL LOW (ref 12.0–15.0)
Immature Granulocytes: 0 %
Lymphocytes Relative: 20 %
Lymphs Abs: 2.2 10*3/uL (ref 0.7–4.0)
MCH: 25.8 pg — ABNORMAL LOW (ref 26.0–34.0)
MCHC: 30.6 g/dL (ref 30.0–36.0)
MCV: 84.3 fL (ref 80.0–100.0)
Monocytes Absolute: 0.8 10*3/uL (ref 0.1–1.0)
Monocytes Relative: 7 %
Neutro Abs: 7.9 10*3/uL — ABNORMAL HIGH (ref 1.7–7.7)
Neutrophils Relative %: 72 %
Platelets: 232 10*3/uL (ref 150–400)
RBC: 3.57 MIL/uL — ABNORMAL LOW (ref 3.87–5.11)
RDW: 14.1 % (ref 11.5–15.5)
WBC: 11.1 10*3/uL — ABNORMAL HIGH (ref 4.0–10.5)
nRBC: 0 % (ref 0.0–0.2)

## 2023-11-19 LAB — BASIC METABOLIC PANEL WITH GFR
Anion gap: 11 (ref 5–15)
BUN: 10 mg/dL (ref 8–23)
CO2: 23 mmol/L (ref 22–32)
Calcium: 8.8 mg/dL — ABNORMAL LOW (ref 8.9–10.3)
Chloride: 102 mmol/L (ref 98–111)
Creatinine, Ser: 0.58 mg/dL (ref 0.44–1.00)
GFR, Estimated: >60 mL/min (ref >60)
Glucose, Bld: 189 mg/dL — ABNORMAL HIGH (ref 70–99)
Potassium: 3.6 mmol/L (ref 3.5–5.1)
Sodium: 136 mmol/L (ref 135–145)

## 2023-11-19 LAB — URINALYSIS, W/ REFLEX TO CULTURE (INFECTION SUSPECTED)
Bacteria, UA: NONE SEEN
Bilirubin Urine: NEGATIVE
Glucose, UA: NEGATIVE mg/dL
Ketones, ur: NEGATIVE mg/dL
Nitrite: NEGATIVE
Protein, ur: 30 mg/dL — AB
Specific Gravity, Urine: 1.023 (ref 1.005–1.030)
pH: 6 (ref 5.0–8.0)

## 2023-11-19 LAB — TSH: TSH: 1.14 u[IU]/mL (ref 0.350–4.500)

## 2023-11-19 LAB — IRON AND TIBC
Iron: 23 ug/dL — ABNORMAL LOW (ref 28–170)
Saturation Ratios: 6 % — ABNORMAL LOW (ref 10.4–31.8)
TIBC: 403 ug/dL (ref 250–450)
UIBC: 380 ug/dL

## 2023-11-19 LAB — HEPATIC FUNCTION PANEL
ALT: 13 U/L (ref 0–44)
AST: 21 U/L (ref 15–41)
Albumin: 3.8 g/dL (ref 3.5–5.0)
Alkaline Phosphatase: 74 U/L (ref 38–126)
Bilirubin, Direct: 0.2 mg/dL (ref 0.0–0.2)
Indirect Bilirubin: 0.7 mg/dL (ref 0.3–0.9)
Total Bilirubin: 0.9 mg/dL (ref 0.0–1.2)
Total Protein: 7 g/dL (ref 6.5–8.1)

## 2023-11-19 LAB — VITAMIN B12: Vitamin B-12: 200 pg/mL (ref 180–914)

## 2023-11-19 LAB — FOLATE: Folate: 26.5 ng/mL (ref >5.9)

## 2023-11-19 LAB — FERRITIN: Ferritin: 34 ng/mL (ref 11–307)

## 2023-11-19 LAB — HEMOGLOBIN A1C
Hgb A1c MFr Bld: 7.1 % — ABNORMAL HIGH (ref 4.8–5.6)
Mean Plasma Glucose: 157.07 mg/dL

## 2023-11-19 MED ORDER — ROSUVASTATIN CALCIUM 10 MG PO TABS
20.0000 mg | ORAL_TABLET | Freq: Every day | ORAL | Status: DC
  Administered 2023-11-19 – 2023-11-22 (×4): 20 mg via ORAL
  Filled 2023-11-19 (×4): qty 2

## 2023-11-19 MED ORDER — SODIUM CHLORIDE 0.9 % IV SOLN
1.0000 g | INTRAVENOUS | Status: DC
  Administered 2023-11-19 – 2023-11-22 (×4): 1 g via INTRAVENOUS
  Filled 2023-11-19 (×4): qty 10

## 2023-11-19 MED ORDER — ALBUTEROL SULFATE (2.5 MG/3ML) 0.083% IN NEBU
2.5000 mg | INHALATION_SOLUTION | Freq: Four times a day (QID) | RESPIRATORY_TRACT | Status: DC | PRN
  Administered 2023-11-20 – 2023-11-22 (×2): 2.5 mg via RESPIRATORY_TRACT
  Filled 2023-11-19 (×2): qty 3

## 2023-11-19 MED ORDER — LEVOTHYROXINE SODIUM 75 MCG PO TABS
75.0000 ug | ORAL_TABLET | Freq: Every day | ORAL | Status: DC
  Administered 2023-11-19 – 2023-11-23 (×5): 75 ug via ORAL
  Filled 2023-11-19 (×5): qty 1

## 2023-11-19 MED ORDER — ALBUTEROL SULFATE HFA 108 (90 BASE) MCG/ACT IN AERS: 2.0000 | INHALATION_SPRAY | RESPIRATORY_TRACT | Status: DC | PRN

## 2023-11-19 MED ORDER — DOXAZOSIN MESYLATE 8 MG PO TABS
8.0000 mg | ORAL_TABLET | Freq: Every day | ORAL | Status: DC
  Administered 2023-11-19 – 2023-11-23 (×5): 8 mg via ORAL
  Filled 2023-11-19 (×5): qty 1

## 2023-11-19 MED ORDER — FERROUS SULFATE 325 (65 FE) MG PO TABS
325.0000 mg | ORAL_TABLET | Freq: Every day | ORAL | Status: DC
  Administered 2023-11-20: 325 mg via ORAL
  Filled 2023-11-19: qty 1

## 2023-11-19 MED ORDER — METOPROLOL SUCCINATE ER 50 MG PO TB24
100.0000 mg | ORAL_TABLET | Freq: Every day | ORAL | Status: DC
  Administered 2023-11-19 – 2023-11-23 (×5): 100 mg via ORAL
  Filled 2023-11-19 (×6): qty 2

## 2023-11-19 MED ORDER — SODIUM CHLORIDE 0.9 % IV SOLN: INTRAVENOUS | Status: DC

## 2023-11-19 MED ORDER — ACETAMINOPHEN 325 MG PO TABS
650.0000 mg | ORAL_TABLET | Freq: Four times a day (QID) | ORAL | Status: DC | PRN
  Administered 2023-11-19: 650 mg via ORAL
  Filled 2023-11-19: qty 2

## 2023-11-19 MED ORDER — ENOXAPARIN SODIUM 40 MG/0.4ML IJ SOSY
40.0000 mg | PREFILLED_SYRINGE | INTRAMUSCULAR | Status: DC
  Administered 2023-11-19 – 2023-11-20 (×2): 40 mg via SUBCUTANEOUS
  Filled 2023-11-19 (×2): qty 0.4

## 2023-11-19 MED ORDER — ESCITALOPRAM OXALATE 20 MG PO TABS
20.0000 mg | ORAL_TABLET | Freq: Every day | ORAL | Status: DC
  Administered 2023-11-19 – 2023-11-23 (×5): 20 mg via ORAL
  Filled 2023-11-19 (×5): qty 1

## 2023-11-19 MED ORDER — AMLODIPINE BESYLATE 5 MG PO TABS
5.0000 mg | ORAL_TABLET | Freq: Every day | ORAL | Status: DC
  Administered 2023-11-19 – 2023-11-23 (×5): 5 mg via ORAL
  Filled 2023-11-19 (×5): qty 1

## 2023-11-19 MED ORDER — LISINOPRIL 20 MG PO TABS
40.0000 mg | ORAL_TABLET | Freq: Every day | ORAL | Status: DC
  Administered 2023-11-19 – 2023-11-23 (×5): 40 mg via ORAL
  Filled 2023-11-19 (×5): qty 2

## 2023-11-19 MED ORDER — INSULIN ASPART 100 UNIT/ML IJ SOLN
0.0000 [IU] | Freq: Three times a day (TID) | INTRAMUSCULAR | Status: DC
  Administered 2023-11-19: 3 [IU] via SUBCUTANEOUS
  Administered 2023-11-19 – 2023-11-20 (×2): 2 [IU] via SUBCUTANEOUS
  Administered 2023-11-20: 3 [IU] via SUBCUTANEOUS
  Administered 2023-11-20 – 2023-11-21 (×4): 2 [IU] via SUBCUTANEOUS
  Administered 2023-11-22 (×3): 3 [IU] via SUBCUTANEOUS
  Administered 2023-11-23 (×2): 2 [IU] via SUBCUTANEOUS

## 2023-11-19 MED ORDER — VITAMIN B-12 1000 MCG PO TABS
500.0000 ug | ORAL_TABLET | Freq: Every day | ORAL | Status: DC
  Administered 2023-11-19 – 2023-11-23 (×5): 500 ug via ORAL
  Filled 2023-11-19 (×5): qty 1

## 2023-11-19 MED ORDER — SODIUM CHLORIDE 0.9 % IV SOLN
100.0000 mg | Freq: Two times a day (BID) | INTRAVENOUS | Status: DC
  Administered 2023-11-19 – 2023-11-21 (×4): 100 mg via INTRAVENOUS
  Filled 2023-11-19 (×4): qty 100

## 2023-11-19 MED ORDER — ACETAMINOPHEN 650 MG RE SUPP: 650.0000 mg | Freq: Four times a day (QID) | RECTAL | Status: DC | PRN

## 2023-11-19 NOTE — Evaluation (Signed)
 Physical Therapy Evaluation Patient Details Name: Kristi Willis MRN: [id number] DOB: Dec 13, 1940 Today's Date: 11/19/2023  History of Present Illness  Kristi Willis is an 83 yo admitted with fever.  Respiratory viral panel came back positive for coronavirus (not COVID-19). PMH: asthma, DM type II, hypothyroidism, HTN, speech articulation disorder/aphasia, anemia of chronic disease, and hyperlipidemia  Clinical Impression  Pt admitted with above diagnosis. Pt's daughter in room providing PLOF and home setup, ambulates without AD at baseline, denies falls, ind with self care, family assists with household chores and provides transportation. Daughter at bedside reports pt uses written communication at baseline with 2 choices or yes/no. On eval, pt up in recliner upon arrival, powers to stand with RW and supv, amb with RW and supv, removed RW and pt able to continue amb with supv for safety as therapist managing lines. On RA pt desats to 88% with ambulation, improves to >92% with 2L- notified RN. Anticipate no f/u PT at discharge with family support at home. Pt currently with functional limitations due to the deficits listed below (see PT Problem List). Pt will benefit from acute skilled PT to increase their independence and safety with mobility to allow discharge.           If plan is discharge home, recommend the following: Assistance with cooking/housework;Assist for transportation;Help with stairs or ramp for entrance   Can travel by private vehicle        Equipment Recommendations None recommended by PT  Recommendations for Other Services       Functional Status Assessment Patient has had a recent decline in their functional status and demonstrates the ability to make significant improvements in function in a reasonable and predictable amount of time.     Precautions / Restrictions Precautions Precautions: Fall Precaution/Restrictions Comments: monitor O2 Restrictions Weight  Bearing Restrictions Per Provider Order: No      Mobility  Bed Mobility               General bed mobility comments: in recliner upon arrival    Transfers Overall transfer level: Needs assistance Equipment used: Rolling walker (2 wheels) Transfers: Sit to/from Stand Sit to Stand: Supervision           General transfer comment: tactile cues for hand placement to power up with RW, supv with therapist managing lines for safety    Ambulation/Gait Ambulation/Gait assistance: Supervision Gait Distance (Feet): 400 Feet Assistive device: Rolling walker (2 wheels), None Gait Pattern/deviations: Step-through pattern, Decreased stride length Gait velocity: decreased     General Gait Details: step through gait pattern with RW for ~200 ft, no LOB; doesn't use AD at baseline so removed and completed gait ~200 ft without AD, good step through gait pattern, flat foot pattern, no dyspnea noted, on RA with SpO2 88% so returned 2L and SpO2 94%, follows gestural cues  Stairs            Wheelchair Mobility     Tilt Bed    Modified Rankin (Stroke Patients Only)       Balance Overall balance assessment: No apparent balance deficits (not formally assessed)                                           Pertinent Vitals/Pain Pain Assessment Pain Assessment: Faces Faces Pain Scale: No hurt    Home Living Family/patient expects to be discharged to::  Private residence Living Arrangements: Alone Available Help at Discharge: Family Type of Home: House Home Access: Stairs to enter   [employer] of Steps: 6 at front with bil handrails and 2 at back without handrials   Home Layout: One level Home Equipment: Shower seat;Other (comment) (3 prong cane)      Prior Function Prior Level of Function : Independent/Modified Independent             Mobility Comments: family reports ind with community amb without AD ADLs Comments: family reports ind  with self care and fixing snacks, family compeltes household chores     Extremity/Trunk Assessment   Upper Extremity Assessment Upper Extremity Assessment: Overall WFL for tasks assessed    Lower Extremity Assessment Lower Extremity Assessment: Overall WFL for tasks assessed (AROM WFL, strength grossly 4/5)    Cervical / Trunk Assessment Cervical / Trunk Assessment: Normal  Communication   Communication Communication: Impaired (aphasia baseline)    Cognition Arousal: Alert Behavior During Therapy: WFL for tasks assessed/performed   PT - Cognitive impairments: No apparent impairments                       PT - Cognition Comments: written communication with yes/no or 2 choices, pleasant, smiles Following commands: Intact       Cueing Cueing Techniques: Gestural cues     General Comments General comments (skin integrity, edema, etc.): Pt on RA with SpO2 >92% at rest, desats to 88% with ambulation, return 2L and SpO2 >92%.    Exercises     Assessment/Plan    PT Assessment Patient needs continued PT services  PT Problem List Decreased activity tolerance;Decreased balance;Decreased cognition;Cardiopulmonary status limiting activity;Decreased knowledge of precautions       PT Treatment Interventions DME instruction;Gait training;Stair training;Functional mobility training;Therapeutic activities;Therapeutic exercise;Balance training;Cognitive remediation;Patient/family education    PT Goals (Current goals can be found in the Care Plan section)  Acute Rehab PT Goals Patient Stated Goal: "walk as much as she can" per daughter PT Goal Formulation: With patient/family Time For Goal Achievement: 12/03/23 Potential to Achieve Goals: Good    Frequency Min 3X/week     Co-evaluation               AM-PAC PT "6 Clicks" Mobility  Outcome Measure Help needed turning from your back to your side while in a flat bed without using bedrails?: A Little Help needed  moving from lying on your back to sitting on the side of a flat bed without using bedrails?: A Little Help needed moving to and from a bed to a chair (including a wheelchair)?: A Little Help needed standing up from a chair using your arms (e.g., wheelchair or bedside chair)?: A Little Help needed to walk in hospital room?: A Little Help needed climbing 3-5 steps with a railing? : A Little 6 Click Score: 18    End of Session Equipment Utilized During Treatment: Gait belt;Oxygen Activity Tolerance: Patient tolerated treatment well Patient left: in chair;with call bell/phone within reach;with family/visitor present Nurse Communication: Mobility status;Other (comment) (SpO2) PT Visit Diagnosis: Other abnormalities of gait and mobility (R26.89)    Time: 0981-1914 PT Time Calculation (min) (ACUTE ONLY): 38 min   Charges:   PT Evaluation $PT Eval Low Complexity: 1 Low PT Treatments $Gait Training: 8-22 mins $Therapeutic Activity: 8-22 mins PT General Charges $$ ACUTE PT VISIT: 1 Visit         Tori Travares Nelles PT, DPT 11/19/23, 11:03 AM

## 2023-11-19 NOTE — Progress Notes (Signed)
   11/19/23 1322  TOC Brief Assessment  Insurance and Status Reviewed  Patient has primary care physician Yes  Home environment has been reviewed Single family home  Prior level of function: modified independent  Prior/Current Home Services No current home services  Social Drivers of Health Review SDOH reviewed no interventions necessary  Readmission risk has been reviewed Yes  Transition of care needs no transition of care needs at this time   [facility] will follow for O2 need at discharge.

## 2023-11-19 NOTE — Progress Notes (Signed)
 PROGRESS NOTE    Kristi Willis  NGE:[id number] DOB: [birth year] DOA: 11/18/2023 PCP: Junie Spencer, FNP   Brief Narrative: 83 year old with past medical history significant for primary progressive aphasia, diabetes type 2, hypertension, hypothyroidism presented to the ED accompanied by daughter due to new development of fever and progressive generalized weakness.  Patient had gone for her dentist appointment and following which patient was found to have fever.  Patient has been having nonproductive cough over the last couple of days.  Evaluation in the ED she had a fever 101, COVID and flu negative, coronavirus 43 positive.  UA with finding concerning for UTI.   Assessment & Plan:   Principal Problem:   Fever Active Problems:   Hyperlipidemia associated with type 2 diabetes mellitus (HCC)   Diabetes mellitus (HCC)   Essential hypertension   Speech articulation disorder   Hypothyroidism   Iron deficiency anemia   Primary progressive aphasia (HCC)   URI (upper respiratory infection)   UTI (urinary tract infection)   Weakness  1-Fever, generalized weakness: In the setting of UTI and coronavirus 43 not COVID-19 -Supportive care -Continue with IV antibiotics -Follow urine culture  2-UTI; Presents with Fever, UA with 21-50 WBC.  Follow urine culture.  Continue with IV ceftriaxone.   3-Hypertension: Continue amlodipine, lisinopril, Toprol and Cardura  Diabetes Type II: Takes Januvia and metformin at home hold for now.  Continue with sliding scale insulin  Acute Hypoxia; Small right pleural effusion:   Stop IV fluids Oxygen 90 % RA. Per nurse oxygen drop to high 80 overnight, currently on 2 L.  -Stop fluids.  Check ECHO.   Hyperlipidemia: Continue with the statins  Hypothyroidism: Continue with Synthroid  Anemia iron deficiency and low normal B12: Start B12 supplementation and iron supplementation  Primary progressive aphasia: Continue with supportive  care Mediastinal adenopathy mildly enlarged right paratracheal lymph node.  Slightly larger than previous studies.  She will need follow-up CT chest 6 months  Estimated body mass index is 28.31 kg/m as calculated from the following:   Height as of this encounter: 5\' 4"  (1.626 m).   Weight as of this encounter: 74.8 kg.   DVT prophylaxis: Lovenox Code Status: Full code Family Communication: Daughter at bedside Disposition Plan:  Status is: Observation The patient will require care spanning > 2 midnights and should be moved to inpatient because: Management of UTI      Procedures:  ECHO  Antimicrobials:    Subjective: Patient is nonverbal, she is aphasic, she is able to communicate sometime by signs.  She is sitting in the recliner, daughter is at bedside.  Report patient developed fever yesterday was very weak  Objective: Vitals:   11/19/23 0000 11/19/23 0106 11/19/23 0515 11/19/23 0813  BP: (!) 149/47 (!) 149/55 (!) 151/55 (!) 168/63  Pulse: 63 63 74 82  Resp: (!) 22 (!) 21 (!) 21 (!) 26  Temp:  98.9 F (37.2 C) 98.6 F (37 C) 99.8 F (37.7 C)  TempSrc:  Oral Oral Oral  SpO2: 98% 100% 94% 93%  Weight:  74.8 kg    Height:  5\' 4"  (1.626 m)      Intake/Output Summary (Last 24 hours) at 11/19/2023 0929 Last data filed at 11/19/2023 4010 Gross per 24 hour  Intake 280.85 ml  Output --  Net 280.85 ml   Filed Weights   11/19/23 0106  Weight: 74.8 kg    Examination:  General exam: Appears calm and comfortable  Respiratory system: Clear to auscultation.  Respiratory effort normal. Cardiovascular system: S1 & S2 heard, RRR. No JVD, murmurs, rubs, gallops or clicks. No pedal edema. Gastrointestinal system: Abdomen is nondistended, soft and nontender. No organomegaly or masses felt. Normal bowel sounds heard. Central nervous system: Alert and oriented. No focal neurological deficits. Extremities: Symmetric 5 x 5 power. Skin: No rashes, lesions or ulcers Psychiatry:  Judgement and insight appear normal. Mood & affect appropriate.     Data Reviewed: I have personally reviewed following labs and imaging studies  CBC: Recent Labs  Lab 11/18/23 1709 11/19/23 0600  WBC 13.9* 11.1*  NEUTROABS 12.1* 7.9*  HGB 8.8* 9.2*  HCT 27.5* 30.1*  MCV 81.4 84.3  PLT 219 232   Basic Metabolic Panel: Recent Labs  Lab 11/18/23 1709 11/19/23 0600  NA 137 136  K 3.9 3.6  CL 101 102  CO2 24 23  GLUCOSE 178* 189*  BUN 13 10  CREATININE 0.56 0.58  CALCIUM 8.9 8.8*   GFR: Estimated Creatinine Clearance: 53.7 mL/min (by C-G formula based on SCr of 0.58 mg/dL). Liver Function Tests: Recent Labs  Lab 11/18/23 1709 11/19/23 0600  AST 12* 21  ALT 10 13  ALKPHOS 74 74  BILITOT 0.8 0.9  PROT 6.7 7.0  ALBUMIN 4.1 3.8   No results for input(s): "LIPASE", "AMYLASE" in the last 168 hours. No results for input(s): "AMMONIA" in the last 168 hours. Coagulation Profile: Recent Labs  Lab 11/18/23 1709  INR 1.1   Cardiac Enzymes: No results for input(s): "CKTOTAL", "CKMB", "CKMBINDEX", "TROPONINI" in the last 168 hours. BNP (last 3 results) No results for input(s): "PROBNP" in the last 8760 hours. HbA1C: No results for input(s): "HGBA1C" in the last 72 hours. CBG: Recent Labs  Lab 11/18/23 1833 11/19/23 0812  GLUCAP 149* 169*   Lipid Profile: No results for input(s): "CHOL", "HDL", "LDLCALC", "TRIG", "CHOLHDL", "LDLDIRECT" in the last 72 hours. Thyroid Function Tests: Recent Labs    11/19/23 0625  TSH 1.140   Anemia Panel: Recent Labs    11/19/23 0625  VITAMINB12 200  FOLATE 26.5  FERRITIN 34  TIBC 403  IRON 23*  RETICCTPCT 2.0   Sepsis Labs: Recent Labs  Lab 11/18/23 1709  LATICACIDVEN 1.7    Recent Results (from the past 240 hours)  Culture, blood (Routine x 2)     Status: None (Preliminary result)   Collection Time: 11/18/23  4:54 PM   Specimen: BLOOD  Result Value Ref Range Status   Specimen Description   Final    BLOOD  LEFT ANTECUBITAL Performed at [facility], [address] [place], [place] [address]    Special Requests   Final    Blood Culture adequate volume BOTTLES DRAWN AEROBIC AND ANAEROBIC Performed at [facility], [address], [place], [place] [address]    Culture   Final    NO GROWTH < 12 HOURS Performed at [facility], [address], [place], [place] [address]    Report Status PENDING  Incomplete  Culture, blood (Routine x 2)     Status: None (Preliminary result)   Collection Time: 11/18/23  4:59 PM   Specimen: BLOOD RIGHT FOREARM  Result Value Ref Range Status   Specimen Description   Final    BLOOD RIGHT FOREARM Performed at [facility], [address] [place], [place] [address]    Special Requests   Final    Blood Culture adequate volume BOTTLES DRAWN AEROBIC AND ANAEROBIC Performed at [facility], [address] [place], [place] [address]    Culture   Final    NO GROWTH < 12 HOURS Performed at [facility], [address] [place], [place] [address]    Report Status PENDING  Incomplete  Resp panel by RT-PCR (RSV, Flu A&B, Covid) Anterior Nasal Swab     Status: None   Collection Time: 11/18/23  5:09 PM   Specimen: Anterior Nasal Swab  Result Value Ref Range Status   SARS Coronavirus 2 by RT PCR NEGATIVE NEGATIVE Final    Comment: (NOTE) SARS-CoV-2 target nucleic acids are NOT DETECTED.  The SARS-CoV-2 RNA is generally detectable in upper respiratory specimens during the acute phase of infection. The lowest concentration of SARS-CoV-2 viral copies this assay can detect is 138 copies/mL. A negative result does not preclude SARS-Cov-2 infection and should not be used as the sole basis for treatment or other patient management decisions. A negative result may occur with  improper specimen collection/handling, submission of specimen other than nasopharyngeal swab, presence of viral  mutation(s) within the areas targeted by this assay, and inadequate number of viral copies(<138 copies/mL). A negative result must be combined with clinical observations, patient history, and epidemiological information. The expected result is Negative.  Fact Sheet for Patients:  BloggerCourse.com  Fact Sheet for Healthcare Providers:  SeriousBroker.it  This test is no t yet approved or cleared by the [place] FDA and  has been authorized for detection and/or diagnosis of SARS-CoV-2 by FDA under an Emergency Use Authorization (EUA). This EUA will remain  in effect (meaning this test can be used) for the duration of the COVID-19 declaration under Section 564(b)(1) of the Act, 21 U.S.C.section 360bbb-3(b)(1), unless the authorization is terminated  or revoked sooner.       Influenza A by PCR NEGATIVE NEGATIVE Final   Influenza B by PCR NEGATIVE NEGATIVE Final    Comment: (NOTE) The Xpert Xpress SARS-CoV-2/FLU/RSV plus assay is intended as an aid in the diagnosis of influenza from Nasopharyngeal swab specimens and should not be used as a sole basis for treatment. Nasal washings and aspirates are unacceptable for Xpert Xpress SARS-CoV-2/FLU/RSV testing.  Fact Sheet for Patients: BloggerCourse.com  Fact Sheet for Healthcare Providers: SeriousBroker.it  This test is not yet approved or cleared by the [place] FDA and has been authorized for detection and/or diagnosis of SARS-CoV-2 by FDA under an Emergency Use Authorization (EUA). This EUA will remain in effect (meaning this test can be used) for the duration of the COVID-19 declaration under Section 564(b)(1) of the Act, 21 U.S.C. section 360bbb-3(b)(1), unless the authorization is terminated or revoked.     Resp Syncytial Virus by PCR NEGATIVE NEGATIVE Final    Comment: (NOTE) Fact Sheet for  Patients: BloggerCourse.com  Fact Sheet for Healthcare Providers: SeriousBroker.it  This test is not yet approved or cleared by the [place] FDA and has been authorized for detection and/or diagnosis of SARS-CoV-2 by FDA under an Emergency Use Authorization (EUA). This EUA will remain in effect (meaning this test can be used) for the duration of the COVID-19 declaration under Section 564(b)(1) of the Act, 21 U.S.C. section 360bbb-3(b)(1), unless the authorization is terminated or revoked.  Performed at [facility], [address] [place], [place] [address]   Respiratory (~20 pathogens) panel by PCR     Status: Abnormal   Collection Time: 11/19/23  1:38 AM   Specimen: Nasopharyngeal Swab; Respiratory  Result Value Ref Range Status   Adenovirus NOT  DETECTED NOT DETECTED Final   Coronavirus 229E NOT DETECTED NOT DETECTED Final    Comment: (NOTE) The Coronavirus on the Respiratory Panel, DOES NOT test for the novel  Coronavirus (2019 nCoV)    Coronavirus HKU1 NOT DETECTED NOT DETECTED Final   Coronavirus NL63 NOT DETECTED NOT DETECTED Final   Coronavirus OC43 DETECTED (A) NOT DETECTED Final   Metapneumovirus NOT DETECTED NOT DETECTED Final   Rhinovirus / Enterovirus NOT DETECTED NOT DETECTED Final   Influenza A NOT DETECTED NOT DETECTED Final   Influenza B NOT DETECTED NOT DETECTED Final   Parainfluenza Virus 1 NOT DETECTED NOT DETECTED Final   Parainfluenza Virus 2 NOT DETECTED NOT DETECTED Final   Parainfluenza Virus 3 NOT DETECTED NOT DETECTED Final   Parainfluenza Virus 4 NOT DETECTED NOT DETECTED Final   Respiratory Syncytial Virus NOT DETECTED NOT DETECTED Final   Bordetella pertussis NOT DETECTED NOT DETECTED Final   Bordetella Parapertussis NOT DETECTED NOT DETECTED Final   Chlamydophila pneumoniae NOT DETECTED NOT DETECTED Final   Mycoplasma pneumoniae NOT DETECTED NOT DETECTED Final     Comment: Performed at [facility], [address] [place], [place] [address]         Radiology Studies: CT Angio Chest PE W and/or Wo Contrast Result Date: 11/18/2023 CLINICAL DATA:  Pulmonary embolism (PE) suspected, high prob fever, cough. EXAM: CT ANGIOGRAPHY CHEST WITH CONTRAST TECHNIQUE: Multidetector CT imaging of the chest was performed using the standard protocol during bolus administration of intravenous contrast. Multiplanar CT image reconstructions and MIPs were obtained to evaluate the vascular anatomy. RADIATION DOSE REDUCTION: This exam was performed according to the departmental dose-optimization program which includes automated exposure control, adjustment of the mA and/or kV according to patient size and/or use of iterative reconstruction technique. CONTRAST:  OMNIPAQUE IOHEXOL 350 MG/ML SOLN COMPARISON:  07/10/2022 FINDINGS: Cardiovascular: No filling defects in the pulmonary arteries to suggest pulmonary emboli. Heart mildly enlarged. Aorta normal caliber. Scattered coronary artery and aortic atherosclerosis. Mediastinum/Nodes: Right paratracheal lymph node has a short axis diameter of 1.6 cm compared to 1.0 cm previously. No axillary or hilar adenopathy. Trachea and esophagus are unremarkable. Thyroid unremarkable. Lungs/Pleura: Small right pleural effusion. Dependent atelectasis in the lower lobes. No confluent opacities. Upper Abdomen: No acute findings Musculoskeletal: Chest wall soft tissues are unremarkable. No acute bony abnormality. Review of the MIP images confirms the above findings. IMPRESSION: No evidence of pulmonary embolus. Coronary artery disease. Mediastinal adenopathy with mildly enlarged right paratracheal lymph node, slightly larger than prior study. This could be reactive. This could be followed with repeat CT in 6 months to ensure stability. Small right pleural effusion. Aortic Atherosclerosis (ICD10-I70.0). Electronically Signed   By: Charlett Nose M.D.    On: 11/18/2023 20:23   DG Chest 2 View Result Date: 11/18/2023 CLINICAL DATA:  Suspected sepsis.  Fever.  Weakness. EXAM: CHEST - 2 VIEW COMPARISON:  07/15/2023. FINDINGS: Low lung volume. Bilateral lung fields are clear. Bilateral costophrenic angles are clear. Stable cardio-mediastinal silhouette. No acute osseous abnormalities. The soft tissues are within normal limits. IMPRESSION: No active cardiopulmonary disease. Electronically Signed   By: Jules Schick M.D.   On: 11/18/2023 18:22        Scheduled Meds:  amLODipine  5 mg Oral Daily   vitamin B-12  500 mcg Oral Daily   doxazosin  8 mg Oral Daily   enoxaparin (LOVENOX) injection  40 mg Subcutaneous Q24H   escitalopram  20 mg Oral Daily   [START  ON 11/20/2023] ferrous sulfate  325 mg Oral Q breakfast   insulin aspart  0-9 Units Subcutaneous TID WC   levothyroxine  75 mcg Oral Q0600   lisinopril  40 mg Oral Daily   metoprolol succinate  100 mg Oral Daily   rosuvastatin  20 mg Oral QHS   Continuous Infusions:  cefTRIAXone (ROCEPHIN)  IV     doxycycline (VIBRAMYCIN) IV       LOS: 0 days    Time spent: 35 minutes    Silvestre Mines A Briannah Lona, MD [facility]   If 7PM-7AM, please contact night-coverage [email or web address]  11/19/2023, 9:29 AM

## 2023-11-19 NOTE — H&P (Signed)
 History and Physical    Kristi Willis YQM:[id number] DOB: 03-18-1941 DOA: 11/18/2023  Patient coming from: Home.  Chief Complaint: Fever and weakness.  HPI: Kristi Willis is a 83 y.o. female with history of primary progressive aphasia, diabetes mellitus type 2, hypertension, hypothyroidism was brought to the ER after patient's daughter found that patient had a fever getting more weaker.  Patient had gone for her dentist appointment and following which patient was found to have fever.  Patient's daughter noticed that patient was weak even prior to the dental appointment finding it difficult to ambulate.  Last two days patient has been having nonproductive cough.  Denies any nausea vomiting or diarrhea.  ED Course: In the ER patient had a temperature of 100.4 F labs show leukocytosis.  COVID and flu test were negative.  CT angiogram of the chest shows reactive lymphadenopathy.  Hemoglobin 8.8.  UA is concerning for possible UTI.  Respiratory viral panel came back positive for coronavirus (not COVID-19).  Patient was empirically placed on ceftriaxone and Zithromax cultures obtained and started on gentle hydration.  Review of Systems: As per HPI, rest all negative.   Past Medical History:  Diagnosis Date   Allergy    Asthma    Cancer (HCC)    skin   Diabetes mellitus    GERD (gastroesophageal reflux disease)    hiatal hernia   Hypercholesteremia    Hypertension    Neuromuscular disorder (HCC)    DM neuropathy   Neuropathy    SVT (supraventricular tachycardia) (HCC)     Past Surgical History:  Procedure Laterality Date   BREAST BIOPSY     left breast biopsy   LUMBAR LAMINECTOMY  08/10   SKIN CANCER EXCISION  2005   SPINE SURGERY     lumbar laminectomy     reports that she has never smoked. She has never used smokeless tobacco. She reports that she does not drink alcohol and does not use drugs.  Allergies  Allergen Reactions   Dimetapp C  [Phenylephrine-Bromphen-Codeine] Itching   Phenylephrine Hcl Itching   Shingrix [Zoster Vac Recomb Adjuvanted] Other (See Comments)    Swelling and fever    Ultram [Tramadol Hcl] Other (See Comments)    Insomnia   Celebrex [Celecoxib] Rash   Cortisone Rash   Fenofibrate Rash   Sulfa Antibiotics Rash    Family History  Problem Relation Age of Onset   Cancer Mother 50       cervical cancer   Depression Mother    Heart disease Father    Stroke Father    Cancer Father        lung cancer   Heart disease Sister 71       MI   Hypertension Sister    Cancer Sister 41       breast cancer   Healthy Daughter     Prior to Admission medications   Medication Sig Start Date End Date Taking? Authorizing Provider  Accu-Chek Softclix Lancets lancets CHECK BLOOD SUGAR ONCE DAILY OR AS DIRECTED Dx E11.40 11/23/20   Jannifer Rodney A, FNP  acyclovir ointment (ZOVIRAX) 5 % APPLY TO THE AFFECTED AREA AROUND MOUTH EVERY 3 HOURS AS DIRECTED UNTIL CLEAR 05/31/19   Hawks, Christy A, FNP  albuterol (PROVENTIL) (2.5 MG/3ML) 0.083% nebulizer solution Take 3 mLs (2.5 mg total) by nebulization every 6 (six) hours as needed for wheezing or shortness of breath. 10/05/21   Junie Spencer, FNP  albuterol (VENTOLIN HFA) 108 (90 Base)  MCG/ACT inhaler INHALE TWO PUFFS EVERY 6 HOURS AS NEEDED FOR WHEEZING OR SHORTNESS OF BREATH 09/08/23   Jannifer Rodney A, FNP  amLODipine (NORVASC) 5 MG tablet TAKE ONE TABLET BY MOUTH DAILY 06/24/23   Jannifer Rodney A, FNP  aspirin 81 MG chewable tablet Chew 81 mg by mouth at bedtime.    [provider]  azelastine (ASTELIN) 0.1 % nasal spray Place 2 sprays into both nostrils 2 (two) times daily. 08/24/14   Deatra Canter, FNP  benzonatate (TESSALON) 200 MG capsule Take 1 capsule (200 mg total) by mouth 3 (three) times daily as needed. 06/12/23   Jannifer Rodney A, FNP  Blood Glucose Monitoring Suppl (ACCU-CHEK AVIVA PLUS) w/Device KIT Check sugars daily & as needed Dx E11.9  08/11/19   Junie Spencer, FNP  cetirizine (ZYRTEC) 10 MG tablet TAKE ONE TABLET DAILY 04/09/23   Jannifer Rodney A, FNP  Coenzyme Q10 (COQ10 PO) Take 1 tablet by mouth daily.    [provider]  doxazosin (CARDURA) 8 MG tablet TAKE (1) TABLET DAILY FOR HIGH BLOOD PRESSURE. 11/12/22   Jannifer Rodney A, FNP  doxycycline (VIBRA-TABS) 100 MG tablet Take 1 tablet (100 mg total) by mouth 2 (two) times daily. 06/26/23   Hawks, Neysa Bonito A, FNP  EPIPEN 2-PAK 0.3 MG/0.3ML SOAJ injection Inject 0.3 mg into the muscle as needed for anaphylaxis. Reported on 01/24/2016 03/07/14   [provider]  escitalopram (LEXAPRO) 20 MG tablet TAKE ONE TABLET BY MOUTH DAILY 06/24/23   Jannifer Rodney A, FNP  glucose blood (ACCU-CHEK AVIVA PLUS) test strip CHECK BLOOD SUGAR ONCE DAILY OR AS DIRECTED Dx E11.40 11/23/20   Jannifer Rodney A, FNP  ipratropium-albuterol (DUONEB) 0.5-2.5 (3) MG/3ML SOLN Take 3 mLs by nebulization 2 (two) times daily. 08/07/19   Myrtie Neither, MD  JANUVIA 50 MG tablet TAKE ONE TABLET BY MOUTH DAILY 06/24/23   Jannifer Rodney A, FNP  ketoconazole (NIZORAL) 2 % cream Apply 1 application topically daily. 05/08/21   Junie Spencer, FNP  levothyroxine (SYNTHROID) 75 MCG tablet TAKE ONE TABLET DAILY BEFORE BREAKFAST 07/03/23   Jannifer Rodney A, FNP  lisinopril (ZESTRIL) 40 MG tablet TAKE ONE TABLET DAILY 09/18/23   Jannifer Rodney A, FNP  loratadine (CLARITIN) 10 MG tablet Take 1 tablet by mouth daily. 06/21/21   [provider]  metFORMIN (GLUCOPHAGE) 1000 MG tablet TAKE  (1)  TABLET TWICE A DAY WITH MEALS (BREAKFAST AND SUPPER) 11/12/22   Jannifer Rodney A, FNP  metoprolol succinate (TOPROL-XL) 100 MG 24 hr tablet TAKE ONE TABLET DAILY 09/18/23   Jannifer Rodney A, FNP  montelukast (SINGULAIR) 10 MG tablet TAKE ONE TABLET AT BEDTIME 06/24/23   Jannifer Rodney A, FNP  predniSONE (STERAPRED UNI-PAK 21 TAB) 10 MG (21) TBPK tablet Use as directed 06/26/23   Jannifer Rodney A, FNP  rosuvastatin  (CRESTOR) 20 MG tablet Take 1 tablet (20 mg total) by mouth at bedtime. 11/12/22 11/12/23  Junie Spencer, FNP    Physical Exam: Constitutional: Moderately built and nourished. Vitals:   11/18/23 2300 11/18/23 2330 11/19/23 0000 11/19/23 0106  BP: (!) 157/45 (!) 139/48 (!) 149/47 (!) 149/55  Pulse:   63 63  Resp: (!) 24 (!) 22 (!) 22 (!) 21  Temp:  98 F (36.7 C)  98.9 F (37.2 C)  TempSrc:    Oral  SpO2: 99% 99% 98% 100%  Weight:    74.8 kg  Height:    5\' 4"  (1.626 m)   Eyes: Anicteric  no pallor. ENMT: No discharge from the ears eyes nose or mouth. Neck: No mass felt.  No neck rigidity. Respiratory: No rhonchi or crepitations. Cardiovascular: S1 and S2 heard. Abdomen: Soft nontender bowel sound present. Musculoskeletal: No edema. Skin: No rash. Neurologic: Alert awake following commands. Psychiatric: Alert awake.   Labs on Admission: I have personally reviewed following labs and imaging studies  CBC: Recent Labs  Lab 11/18/23 1709  WBC 13.9*  NEUTROABS 12.1*  HGB 8.8*  HCT 27.5*  MCV 81.4  PLT 219   Basic Metabolic Panel: Recent Labs  Lab 11/18/23 1709  NA 137  K 3.9  CL 101  CO2 24  GLUCOSE 178*  BUN 13  CREATININE 0.56  CALCIUM 8.9   GFR: Estimated Creatinine Clearance: 53.7 mL/min (by C-G formula based on SCr of 0.56 mg/dL). Liver Function Tests: Recent Labs  Lab 11/18/23 1709  AST 12*  ALT 10  ALKPHOS 74  BILITOT 0.8  PROT 6.7  ALBUMIN 4.1   No results for input(s): "LIPASE", "AMYLASE" in the last 168 hours. No results for input(s): "AMMONIA" in the last 168 hours. Coagulation Profile: Recent Labs  Lab 11/18/23 1709  INR 1.1   Cardiac Enzymes: No results for input(s): "CKTOTAL", "CKMB", "CKMBINDEX", "TROPONINI" in the last 168 hours. BNP (last 3 results) No results for input(s): "PROBNP" in the last 8760 hours. HbA1C: No results for input(s): "HGBA1C" in the last 72 hours. CBG: Recent Labs  Lab 11/18/23 1833  GLUCAP 149*    Lipid Profile: No results for input(s): "CHOL", "HDL", "LDLCALC", "TRIG", "CHOLHDL", "LDLDIRECT" in the last 72 hours. Thyroid Function Tests: No results for input(s): "TSH", "T4TOTAL", "FREET4", "T3FREE", "THYROIDAB" in the last 72 hours. Anemia Panel: No results for input(s): "VITAMINB12", "FOLATE", "FERRITIN", "TIBC", "IRON", "RETICCTPCT" in the last 72 hours. Urine analysis:    Component Value Date/Time   COLORURINE YELLOW 11/19/2023 0237   APPEARANCEUR CLEAR 11/19/2023 0237   APPEARANCEUR Clear 06/25/2022 1527   LABSPEC 1.023 11/19/2023 0237   PHURINE 6.0 11/19/2023 0237   GLUCOSEU NEGATIVE 11/19/2023 0237   HGBUR SMALL (A) 11/19/2023 0237   BILIRUBINUR NEGATIVE 11/19/2023 0237   BILIRUBINUR Negative 06/25/2022 1527   KETONESUR NEGATIVE 11/19/2023 0237   PROTEINUR 30 (A) 11/19/2023 0237   UROBILINOGEN 0.2 02/14/2012 0836   NITRITE NEGATIVE 11/19/2023 0237   LEUKOCYTESUR MODERATE (A) 11/19/2023 0237   Sepsis Labs: @LABRCNTIP (procalcitonin:4,lacticidven:4) ) Recent Results (from the past 240 hours)  Culture, blood (Routine x 2)     Status: None (Preliminary result)   Collection Time: 11/18/23  4:59 PM   Specimen: BLOOD RIGHT FOREARM  Result Value Ref Range Status   Specimen Description   Final    BLOOD RIGHT FOREARM Performed at [facility], [address] [place], [place] [address]    Special Requests   Final    Blood Culture adequate volume BOTTLES DRAWN AEROBIC AND ANAEROBIC Performed at [facility], [address], [place], [place] [address]    Culture PENDING  Incomplete   Report Status PENDING  Incomplete  Resp panel by RT-PCR (RSV, Flu A&B, Covid) Anterior Nasal Swab     Status: None   Collection Time: 11/18/23  5:09 PM   Specimen: Anterior Nasal Swab  Result Value Ref Range Status   SARS Coronavirus 2 by RT PCR NEGATIVE NEGATIVE Final    Comment: (NOTE) SARS-CoV-2 target nucleic acids are NOT DETECTED.  The SARS-CoV-2 RNA  is generally detectable in upper respiratory specimens during the acute phase  of infection. The lowest concentration of SARS-CoV-2 viral copies this assay can detect is 138 copies/mL. A negative result does not preclude SARS-Cov-2 infection and should not be used as the sole basis for treatment or other patient management decisions. A negative result may occur with  improper specimen collection/handling, submission of specimen other than nasopharyngeal swab, presence of viral mutation(s) within the areas targeted by this assay, and inadequate number of viral copies(<138 copies/mL). A negative result must be combined with clinical observations, patient history, and epidemiological information. The expected result is Negative.  Fact Sheet for Patients:  BloggerCourse.com  Fact Sheet for Healthcare Providers:  SeriousBroker.it  This test is no t yet approved or cleared by the [place] FDA and  has been authorized for detection and/or diagnosis of SARS-CoV-2 by FDA under an Emergency Use Authorization (EUA). This EUA will remain  in effect (meaning this test can be used) for the duration of the COVID-19 declaration under Section 564(b)(1) of the Act, 21 U.S.C.section 360bbb-3(b)(1), unless the authorization is terminated  or revoked sooner.       Influenza A by PCR NEGATIVE NEGATIVE Final   Influenza B by PCR NEGATIVE NEGATIVE Final    Comment: (NOTE) The Xpert Xpress SARS-CoV-2/FLU/RSV plus assay is intended as an aid in the diagnosis of influenza from Nasopharyngeal swab specimens and should not be used as a sole basis for treatment. Nasal washings and aspirates are unacceptable for Xpert Xpress SARS-CoV-2/FLU/RSV testing.  Fact Sheet for Patients: BloggerCourse.com  Fact Sheet for Healthcare Providers: SeriousBroker.it  This test is not yet approved or cleared by the  [place] FDA and has been authorized for detection and/or diagnosis of SARS-CoV-2 by FDA under an Emergency Use Authorization (EUA). This EUA will remain in effect (meaning this test can be used) for the duration of the COVID-19 declaration under Section 564(b)(1) of the Act, 21 U.S.C. section 360bbb-3(b)(1), unless the authorization is terminated or revoked.     Resp Syncytial Virus by PCR NEGATIVE NEGATIVE Final    Comment: (NOTE) Fact Sheet for Patients: BloggerCourse.com  Fact Sheet for Healthcare Providers: SeriousBroker.it  This test is not yet approved or cleared by the [place] FDA and has been authorized for detection and/or diagnosis of SARS-CoV-2 by FDA under an Emergency Use Authorization (EUA). This EUA will remain in effect (meaning this test can be used) for the duration of the COVID-19 declaration under Section 564(b)(1) of the Act, 21 U.S.C. section 360bbb-3(b)(1), unless the authorization is terminated or revoked.  Performed at [facility], [address] [place], [place] [address]      Radiological Exams on Admission: CT Angio Chest PE W and/or Wo Contrast Result Date: 11/18/2023 CLINICAL DATA:  Pulmonary embolism (PE) suspected, high prob fever, cough. EXAM: CT ANGIOGRAPHY CHEST WITH CONTRAST TECHNIQUE: Multidetector CT imaging of the chest was performed using the standard protocol during bolus administration of intravenous contrast. Multiplanar CT image reconstructions and MIPs were obtained to evaluate the vascular anatomy. RADIATION DOSE REDUCTION: This exam was performed according to the departmental dose-optimization program which includes automated exposure control, adjustment of the mA and/or kV according to patient size and/or use of iterative reconstruction technique. CONTRAST:  OMNIPAQUE IOHEXOL 350 MG/ML SOLN COMPARISON:  07/10/2022 FINDINGS: Cardiovascular: No filling  defects in the pulmonary arteries to suggest pulmonary emboli. Heart mildly enlarged. Aorta normal caliber. Scattered coronary artery and aortic atherosclerosis. Mediastinum/Nodes: Right paratracheal lymph node has a short axis diameter of 1.6 cm compared to 1.0 cm previously.  No axillary or hilar adenopathy. Trachea and esophagus are unremarkable. Thyroid unremarkable. Lungs/Pleura: Small right pleural effusion. Dependent atelectasis in the lower lobes. No confluent opacities. Upper Abdomen: No acute findings Musculoskeletal: Chest wall soft tissues are unremarkable. No acute bony abnormality. Review of the MIP images confirms the above findings. IMPRESSION: No evidence of pulmonary embolus. Coronary artery disease. Mediastinal adenopathy with mildly enlarged right paratracheal lymph node, slightly larger than prior study. This could be reactive. This could be followed with repeat CT in 6 months to ensure stability. Small right pleural effusion. Aortic Atherosclerosis (ICD10-I70.0). Electronically Signed   By: Charlett Nose M.D.   On: 11/18/2023 20:23   DG Chest 2 View Result Date: 11/18/2023 CLINICAL DATA:  Suspected sepsis.  Fever.  Weakness. EXAM: CHEST - 2 VIEW COMPARISON:  07/15/2023. FINDINGS: Low lung volume. Bilateral lung fields are clear. Bilateral costophrenic angles are clear. Stable cardio-mediastinal silhouette. No acute osseous abnormalities. The soft tissues are within normal limits. IMPRESSION: No active cardiopulmonary disease. Electronically Signed   By: Jules Schick M.D.   On: 11/18/2023 18:22     Assessment/Plan Principal Problem:   Fever Active Problems:   Diabetes mellitus (HCC)   Essential hypertension   Speech articulation disorder   Hypothyroidism   Iron deficiency anemia   Primary progressive aphasia (HCC)   URI (upper respiratory infection)   UTI (urinary tract infection)    Fever generalized weakness could be from viral infection and possible UTI.  Respiratory viral  panel came back positive for coronavirus (not COVID-19).  UA is concerning for UTI.  Follow cultures for now continue with empiric antibiotics and IV hydration.  Weakness could be also from worsening anemia. Hypertension on amlodipine lisinopril Toprol and Cardura.  EKG is pending. Diabetes mellitus type 2 no recent hemoglobin A1c in the chart.  Takes Januvia and metformin at home.  On sliding scale coverage. Hyperlipidemia on statins. Hypothyroidism on Synthroid.  Check TSH. Anemia with mild worsening of hemoglobin.  Follow CBC.  Check a.m. anemia panel. Primary progressive aphasia.  Since patient has fever and weakness need close monitoring and more than 2 midnight stay.   DVT prophylaxis: Lovenox. Code Status: Full code. Family Communication: Patient's daughter. Disposition Plan: Medical floor. Consults called: None. Admission status: Observation.

## 2023-11-19 NOTE — Plan of Care (Signed)
  Problem: Education: Goal: Knowledge of General Education information will improve Description: Including pain rating scale, medication(s)/side effects and non-pharmacologic comfort measures Outcome: Progressing   Problem: Clinical Measurements: Goal: Ability to maintain clinical measurements within normal limits will improve Outcome: Progressing Goal: Will remain free from infection Outcome: Progressing Goal: Diagnostic test results will improve Outcome: Progressing Goal: Respiratory complications will improve Outcome: Progressing   Problem: Nutrition: Goal: Adequate nutrition will be maintained Outcome: Progressing   

## 2023-11-19 NOTE — ED Provider Notes (Deleted)
 Patient signed out pending discussion with hospitalist.  In brief presented with upper respiratory symptoms.  3 L oxygen requirement.  Likely combination of pneumonia versus mild CHF.  Was given antibiotics and diuretics.  Clinically stable but does have a elevated white count at 13.  Discussed with Dr. Janalyn Shy.  Physical Exam  BP (!) 149/47   Pulse 63   Temp 98 F (36.7 C)   Resp (!) 22   SpO2 98%    Procedures  Procedures  ED Course / MDM   Clinical Course as of 11/19/23 0025  Tue Nov 18, 2023  1733 Hemoglobin(!): 8.8 Baseline of 10 [RP]  2311 Dr Janalyn Shy from hospitalist to admit.  [RP]    Clinical Course User Index [RP] Rondel Baton, MD   Medical Decision Making Amount and/or Complexity of Data Reviewed Labs: ordered. Decision-making details documented in ED Course. Radiology: ordered.  Risk OTC drugs. Prescription drug management. Decision regarding hospitalization.   Problem List Items Addressed This Visit   None Visit Diagnoses       Hypoxia    -  Primary             Georgena Weisheit, Mayer Masker, MD 11/19/23 [phone]

## 2023-11-20 ENCOUNTER — Inpatient Hospital Stay (HOSPITAL_COMMUNITY)

## 2023-11-20 DIAGNOSIS — R0902 Hypoxemia: Secondary | ICD-10-CM | POA: Diagnosis not present

## 2023-11-20 DIAGNOSIS — I517 Cardiomegaly: Secondary | ICD-10-CM | POA: Diagnosis not present

## 2023-11-20 DIAGNOSIS — J9811 Atelectasis: Secondary | ICD-10-CM | POA: Diagnosis not present

## 2023-11-20 LAB — BASIC METABOLIC PANEL WITH GFR
Anion gap: 11 (ref 5–15)
BUN: 12 mg/dL (ref 8–23)
CO2: 21 mmol/L — ABNORMAL LOW (ref 22–32)
Calcium: 8.1 mg/dL — ABNORMAL LOW (ref 8.9–10.3)
Chloride: 103 mmol/L (ref 98–111)
Creatinine, Ser: 0.65 mg/dL (ref 0.44–1.00)
GFR, Estimated: >60 mL/min (ref >60)
Glucose, Bld: 197 mg/dL — ABNORMAL HIGH (ref 70–99)
Potassium: 3.5 mmol/L (ref 3.5–5.1)
Sodium: 135 mmol/L (ref 135–145)

## 2023-11-20 LAB — GLUCOSE, CAPILLARY
Glucose-Capillary: 193 mg/dL — ABNORMAL HIGH (ref 70–99)
Glucose-Capillary: 214 mg/dL — ABNORMAL HIGH (ref 70–99)
Glucose-Capillary: 169 mg/dL — ABNORMAL HIGH (ref 70–99)
Glucose-Capillary: 212 mg/dL — ABNORMAL HIGH (ref 70–99)

## 2023-11-20 LAB — CBC
HCT: 25.2 % — ABNORMAL LOW (ref 36.0–46.0)
Hemoglobin: 7.6 g/dL — ABNORMAL LOW (ref 12.0–15.0)
MCH: 25.9 pg — ABNORMAL LOW (ref 26.0–34.0)
MCHC: 30.2 g/dL (ref 30.0–36.0)
MCV: 86 fL (ref 80.0–100.0)
Platelets: 184 10*3/uL (ref 150–400)
RBC: 2.93 MIL/uL — ABNORMAL LOW (ref 3.87–5.11)
RDW: 14.5 % (ref 11.5–15.5)
WBC: 8 10*3/uL (ref 4.0–10.5)
nRBC: 0 % (ref 0.0–0.2)

## 2023-11-20 LAB — URINE CULTURE: Culture: NO GROWTH

## 2023-11-20 MED ORDER — IPRATROPIUM-ALBUTEROL 0.5-2.5 (3) MG/3ML IN SOLN
3.0000 mL | Freq: Three times a day (TID) | RESPIRATORY_TRACT | Status: DC
  Administered 2023-11-21 – 2023-11-23 (×8): 3 mL via RESPIRATORY_TRACT
  Filled 2023-11-20 (×8): qty 3

## 2023-11-20 MED ORDER — BUDESONIDE 0.25 MG/2ML IN SUSP
0.2500 mg | Freq: Two times a day (BID) | RESPIRATORY_TRACT | Status: DC
  Administered 2023-11-20 – 2023-11-23 (×6): 0.25 mg via RESPIRATORY_TRACT
  Filled 2023-11-20 (×6): qty 2

## 2023-11-20 MED ORDER — ENOXAPARIN SODIUM 40 MG/0.4ML IJ SOSY: 40.0000 mg | PREFILLED_SYRINGE | INTRAMUSCULAR | Status: DC

## 2023-11-20 MED ORDER — IPRATROPIUM-ALBUTEROL 0.5-2.5 (3) MG/3ML IN SOLN
3.0000 mL | Freq: Four times a day (QID) | RESPIRATORY_TRACT | Status: DC
  Administered 2023-11-20: 3 mL via RESPIRATORY_TRACT
  Filled 2023-11-20: qty 3

## 2023-11-20 MED ORDER — FUROSEMIDE 20 MG PO TABS
20.0000 mg | ORAL_TABLET | Freq: Every day | ORAL | Status: DC
  Administered 2023-11-20 – 2023-11-22 (×3): 20 mg via ORAL
  Filled 2023-11-20 (×3): qty 1

## 2023-11-20 MED ORDER — SODIUM CHLORIDE 0.9% FLUSH
10.0000 mL | Freq: Two times a day (BID) | INTRAVENOUS | Status: DC
Start: 2023-11-20 — End: 2023-11-23
  Administered 2023-11-20 – 2023-11-23 (×6): 10 mL

## 2023-11-20 MED ORDER — POTASSIUM CHLORIDE CRYS ER 20 MEQ PO TBCR
40.0000 meq | EXTENDED_RELEASE_TABLET | Freq: Once | ORAL | Status: AC
  Administered 2023-11-20: 40 meq via ORAL
  Filled 2023-11-20: qty 2

## 2023-11-20 NOTE — Progress Notes (Signed)
 Mobility Specialist - Progress Note   11/20/23 1226  Oxygen Therapy  SpO2 94 %  O2 Device Room Air  Patient Activity (if Appropriate) Ambulating  Mobility  Activity Ambulated independently in hallway  Level of Assistance Independent  Assistive Device None  Distance Ambulated (ft) 480 ft  Activity Response Tolerated well  Mobility Referral Yes  Mobility visit 1 Mobility  Mobility Specialist Start Time (ACUTE ONLY) 1157  Mobility Specialist Stop Time (ACUTE ONLY) 1226  Mobility Specialist Time Calculation (min) (ACUTE ONLY) 29 min   Nurse requested Mobility Specialist to perform oxygen saturation test with pt which includes removing pt from oxygen both at rest and while ambulating.  Below are the results from that testing.     Patient Saturations on Room Air at Rest = spO2 94%  Patient Saturations on Room Air while Ambulating = sp02 94% .    At end of testing pt left in room on 0  Liters of oxygen.  Reported results to nurse.  Pt received in recliner and agreeable to mobility. No complaints during session. Pt to recliner for meal after session with all needs met.    [facility]

## 2023-11-20 NOTE — Progress Notes (Signed)
 PROGRESS NOTE    Kristi Willis  WUJ:[id number] DOB: 04-29-1941 DOA: 11/18/2023 PCP: Junie Spencer, FNP   Brief Narrative: 83 year old with past medical history significant for primary progressive aphasia, diabetes type 2, hypertension, hypothyroidism presented to the ED accompanied by daughter due to new development of fever and progressive generalized weakness.  Patient had gone for her dentist appointment and following which patient was found to have fever.  Patient has been having nonproductive cough over the last couple of days.  Evaluation in the ED she had a fever 101, COVID and flu negative, coronavirus 43 positive.  UA with finding concerning for UTI.   Assessment & Plan:   Principal Problem:   Fever Active Problems:   Hyperlipidemia associated with type 2 diabetes mellitus (HCC)   Diabetes mellitus (HCC)   Essential hypertension   Speech articulation disorder   Hypothyroidism   Iron deficiency anemia   Primary progressive aphasia (HCC)   URI (upper respiratory infection)   UTI (urinary tract infection)   Weakness   Hypoxia  1-Fever, generalized weakness: In the setting of UTI and coronavirus 43 not COVID-19 -Supportive care -Continue with IV antibiotics -Urine culture: no growth to date  2-UTI; Presents with Fever, UA with 21-50 WBC.  Urine culture no growth to date Continue with IV ceftriaxone.   3-Hypertension: Continue amlodipine, lisinopril, Toprol and Cardura  Diabetes Type II: Takes Januvia and metformin at home hold for now.  Continue with sliding scale insulin  Acute Hypoxia; Small right pleural effusion:   Stop IV fluids Oxygen 90 % RA. Per nurse oxygen drop to high 80 overnight, currently on 2 L.  -Stop fluids.  ECHO; showed diastolic Dysfunction grade 3.  Chest x ray cardiomegaly, atelectasis.  Will start low dose lasix.   Hyperlipidemia: Continue with the statins  Hypothyroidism: Continue with Synthroid  Anemia iron deficiency and low  normal B12: Started B12 supplementation and iron supplementation Hb down to 7.6. no sign of bleeding.  Repeat in am./   Primary progressive aphasia: Continue with supportive care Mediastinal adenopathy mildly enlarged right paratracheal lymph node.  Slightly larger than previous studies.  She will need follow-up CT chest 6 months  Estimated body mass index is 28.31 kg/m as calculated from the following:   Height as of this encounter: 5\' 4"  (1.626 m).   Weight as of this encounter: 74.8 kg.   DVT prophylaxis: Lovenox Code Status: Full code Family Communication: Daughter at bedside Disposition Plan:  Status is: Observation The patient will require care spanning > 2 midnights and should be moved to inpatient because: Management of UTI      Procedures:  ECHO  Antimicrobials:    Subjective: Patient is alert, non verbal. In no distress. Per daughter patient was wheezing earlier today.   Objective: Vitals:   11/20/23 0210 11/20/23 0457 11/20/23 0844 11/20/23 1226  BP: (!) 139/35 (!) 134/55 (!) 153/64   Pulse: 66 71 75   Resp: 20 20 16    Temp: 98.8 F (37.1 C) 99.2 F (37.3 C)    TempSrc: Oral Oral    SpO2: 98% 97% 98% 94%  Weight:      Height:        Intake/Output Summary (Last 24 hours) at 11/20/2023 1230 Last data filed at 11/20/2023 0900 Gross per 24 hour  Intake 600 ml  Output --  Net 600 ml   Filed Weights   11/19/23 0106  Weight: 74.8 kg    Examination:  General exam: NAD Respiratory system:  No wheezing, decreased breath sounds Cardiovascular system: S 1, S 2 RRR Gastrointestinal system: BS present, soft, NT Central nervous system: Alert Extremities: no edema    Data Reviewed: I have personally reviewed following labs and imaging studies  CBC: Recent Labs  Lab 11/18/23 1709 11/19/23 0600 11/20/23 0516  WBC 13.9* 11.1* 8.0  NEUTROABS 12.1* 7.9*  --   HGB 8.8* 9.2* 7.6*  HCT 27.5* 30.1* 25.2*  MCV 81.4 84.3 86.0  PLT 219 232 184   Basic  Metabolic Panel: Recent Labs  Lab 11/18/23 1709 11/19/23 0600 11/20/23 0516  NA 137 136 135  K 3.9 3.6 3.5  CL 101 102 103  CO2 24 23 21*  GLUCOSE 178* 189* 197*  BUN 13 10 12   CREATININE 0.56 0.58 0.65  CALCIUM 8.9 8.8* 8.1*   GFR: Estimated Creatinine Clearance: 53.7 mL/min (by C-G formula based on SCr of 0.65 mg/dL). Liver Function Tests: Recent Labs  Lab 11/18/23 1709 11/19/23 0600  AST 12* 21  ALT 10 13  ALKPHOS 74 74  BILITOT 0.8 0.9  PROT 6.7 7.0  ALBUMIN 4.1 3.8   No results for input(s): "LIPASE", "AMYLASE" in the last 168 hours. No results for input(s): "AMMONIA" in the last 168 hours. Coagulation Profile: Recent Labs  Lab 11/18/23 1709  INR 1.1   Cardiac Enzymes: No results for input(s): "CKTOTAL", "CKMB", "CKMBINDEX", "TROPONINI" in the last 168 hours. BNP (last 3 results) No results for input(s): "PROBNP" in the last 8760 hours. HbA1C: Recent Labs    11/19/23 0600  HGBA1C 7.1*   CBG: Recent Labs  Lab 11/19/23 0812 11/19/23 1136 11/19/23 1654 11/19/23 2027 11/20/23 0825  GLUCAP 169* 230* 155* 257* 193*   Lipid Profile: No results for input(s): "CHOL", "HDL", "LDLCALC", "TRIG", "CHOLHDL", "LDLDIRECT" in the last 72 hours. Thyroid Function Tests: Recent Labs    11/19/23 0625  TSH 1.140   Anemia Panel: Recent Labs    11/19/23 0625  VITAMINB12 200  FOLATE 26.5  FERRITIN 34  TIBC 403  IRON 23*  RETICCTPCT 2.0   Sepsis Labs: Recent Labs  Lab 11/18/23 1709  LATICACIDVEN 1.7    Recent Results (from the past 240 hours)  Culture, blood (Routine x 2)     Status: None (Preliminary result)   Collection Time: 11/18/23  4:54 PM   Specimen: BLOOD  Result Value Ref Range Status   Specimen Description   Final    BLOOD LEFT ANTECUBITAL Performed at [facility], [address], [place], [place] [address]    Special Requests   Final    Blood Culture adequate volume BOTTLES DRAWN AEROBIC AND ANAEROBIC Performed  at [facility], [address] [place], [place] [address]    Culture   Final    NO GROWTH 2 DAYS Performed at [facility], [address], [place], [place] [address]    Report Status PENDING  Incomplete  Culture, blood (Routine x 2)     Status: None (Preliminary result)   Collection Time: 11/18/23  4:59 PM   Specimen: BLOOD RIGHT FOREARM  Result Value Ref Range Status   Specimen Description   Final    BLOOD RIGHT FOREARM Performed at [facility], [address], [place], [place] [address]    Special Requests   Final    Blood Culture adequate volume BOTTLES DRAWN AEROBIC AND ANAEROBIC Performed at [facility], [address], [place], [place] [address]    Culture   Final  NO GROWTH 2 DAYS Performed at [facility], [address] [place], [place] [address]    Report Status PENDING  Incomplete  Resp panel by RT-PCR (RSV, Flu A&B, Covid) Anterior Nasal Swab     Status: None   Collection Time: 11/18/23  5:09 PM   Specimen: Anterior Nasal Swab  Result Value Ref Range Status   SARS Coronavirus 2 by RT PCR NEGATIVE NEGATIVE Final    Comment: (NOTE) SARS-CoV-2 target nucleic acids are NOT DETECTED.  The SARS-CoV-2 RNA is generally detectable in upper respiratory specimens during the acute phase of infection. The lowest concentration of SARS-CoV-2 viral copies this assay can detect is 138 copies/mL. A negative result does not preclude SARS-Cov-2 infection and should not be used as the sole basis for treatment or other patient management decisions. A negative result may occur with  improper specimen collection/handling, submission of specimen other than nasopharyngeal swab, presence of viral mutation(s) within the areas targeted by this assay, and inadequate number of viral copies(<138 copies/mL). A negative result must be combined with clinical observations, patient history, and epidemiological information. The  expected result is Negative.  Fact Sheet for Patients:  BloggerCourse.com  Fact Sheet for Healthcare Providers:  SeriousBroker.it  This test is no t yet approved or cleared by the [place] FDA and  has been authorized for detection and/or diagnosis of SARS-CoV-2 by FDA under an Emergency Use Authorization (EUA). This EUA will remain  in effect (meaning this test can be used) for the duration of the COVID-19 declaration under Section 564(b)(1) of the Act, 21 U.S.C.section 360bbb-3(b)(1), unless the authorization is terminated  or revoked sooner.       Influenza A by PCR NEGATIVE NEGATIVE Final   Influenza B by PCR NEGATIVE NEGATIVE Final    Comment: (NOTE) The Xpert Xpress SARS-CoV-2/FLU/RSV plus assay is intended as an aid in the diagnosis of influenza from Nasopharyngeal swab specimens and should not be used as a sole basis for treatment. Nasal washings and aspirates are unacceptable for Xpert Xpress SARS-CoV-2/FLU/RSV testing.  Fact Sheet for Patients: BloggerCourse.com  Fact Sheet for Healthcare Providers: SeriousBroker.it  This test is not yet approved or cleared by the [place] FDA and has been authorized for detection and/or diagnosis of SARS-CoV-2 by FDA under an Emergency Use Authorization (EUA). This EUA will remain in effect (meaning this test can be used) for the duration of the COVID-19 declaration under Section 564(b)(1) of the Act, 21 U.S.C. section 360bbb-3(b)(1), unless the authorization is terminated or revoked.     Resp Syncytial Virus by PCR NEGATIVE NEGATIVE Final    Comment: (NOTE) Fact Sheet for Patients: BloggerCourse.com  Fact Sheet for Healthcare Providers: SeriousBroker.it  This test is not yet approved or cleared by the [place] FDA and has been authorized for detection and/or  diagnosis of SARS-CoV-2 by FDA under an Emergency Use Authorization (EUA). This EUA will remain in effect (meaning this test can be used) for the duration of the COVID-19 declaration under Section 564(b)(1) of the Act, 21 U.S.C. section 360bbb-3(b)(1), unless the authorization is terminated or revoked.  Performed at [facility], [address], [place], [place] [address]   Respiratory (~20 pathogens) panel by PCR     Status: Abnormal   Collection Time: 11/19/23  1:38 AM   Specimen: Nasopharyngeal Swab; Respiratory  Result Value Ref Range Status   Adenovirus NOT DETECTED NOT DETECTED Final   Coronavirus 229E NOT DETECTED NOT DETECTED Final    Comment: (NOTE)  The Coronavirus on the Respiratory Panel, DOES NOT test for the novel  Coronavirus (2019 nCoV)    Coronavirus HKU1 NOT DETECTED NOT DETECTED Final   Coronavirus NL63 NOT DETECTED NOT DETECTED Final   Coronavirus OC43 DETECTED (A) NOT DETECTED Final   Metapneumovirus NOT DETECTED NOT DETECTED Final   Rhinovirus / Enterovirus NOT DETECTED NOT DETECTED Final   Influenza A NOT DETECTED NOT DETECTED Final   Influenza B NOT DETECTED NOT DETECTED Final   Parainfluenza Virus 1 NOT DETECTED NOT DETECTED Final   Parainfluenza Virus 2 NOT DETECTED NOT DETECTED Final   Parainfluenza Virus 3 NOT DETECTED NOT DETECTED Final   Parainfluenza Virus 4 NOT DETECTED NOT DETECTED Final   Respiratory Syncytial Virus NOT DETECTED NOT DETECTED Final   Bordetella pertussis NOT DETECTED NOT DETECTED Final   Bordetella Parapertussis NOT DETECTED NOT DETECTED Final   Chlamydophila pneumoniae NOT DETECTED NOT DETECTED Final   Mycoplasma pneumoniae NOT DETECTED NOT DETECTED Final    Comment: Performed at [facility], [address] [place], [place] [address]  Urine Culture     Status: None   Collection Time: 11/19/23  2:37 AM   Specimen: Urine, Random  Result Value Ref Range Status   Specimen Description   Final    URINE,  RANDOM Performed at [facility], [address] [place], [place] [address]    Special Requests   Final    NONE Reflexed from [id number] Performed at [facility], [address] [place], [place] [address]    Culture   Final    NO GROWTH Performed at [facility], [address], [place], [place] [address]    Report Status 11/20/2023 FINAL  Final         Radiology Studies: DG CHEST PORT 1 VIEW Result Date: 11/20/2023 CLINICAL DATA:  Hypoxia. EXAM: PORTABLE CHEST 1 VIEW COMPARISON:  November 18, 2023. FINDINGS: Mild cardiomegaly is noted. Minimal bibasilar subsegmental atelectasis is noted. Bony thorax is unremarkable. IMPRESSION: Minimal bibasilar subsegmental atelectasis. Electronically Signed   By: Lupita Raider M.D.   On: 11/20/2023 11:14   ECHOCARDIOGRAM COMPLETE Result Date: 11/19/2023    ECHOCARDIOGRAM REPORT   Patient Name:   KARSTYN BIRKEY Date of Exam: 11/19/2023 Medical Rec #:  [id number]           Height:       64.0 in Accession #:    [id number]          Weight:       164.9 lb Date of Birth:  [birth year]           BSA:          1.802 m Patient Age:    82 years            BP:           168/63 mmHg Patient Gender: F                   HR:           40 bpm. Exam Location:  Inpatient Procedure: 2D Echo, Cardiac Doppler and Color Doppler (Both Spectral and Color            Flow Doppler were utilized during procedure). Indications:    Abnormal EKG  History:        Patient has prior history of Echocardiogram examinations, most  recent 02/11/2021. Risk Factors:Hypertension.  Sonographer:    Amy Chionchio Referring Phys: [id number] Daeron Carreno A Harlen Danford IMPRESSIONS  1. Left ventricular ejection fraction, by estimation, is 60 to 65%. The left ventricle has normal function. The left ventricle has no regional wall motion abnormalities. There is mild left ventricular hypertrophy. Left ventricular diastolic parameters are consistent with Grade III  diastolic dysfunction (restrictive). Elevated left atrial pressure.  2. Right ventricular systolic function is normal. The right ventricular size is normal. Tricuspid regurgitation signal is inadequate for assessing PA pressure.  3. Left atrial size was mildly dilated.  4. A small pericardial effusion is present.  5. The mitral valve is degenerative. Trivial mitral valve regurgitation. No evidence of mitral stenosis. Moderate mitral annular calcification.  6. The aortic valve is tricuspid. Aortic valve regurgitation is not visualized. No aortic stenosis is present. FINDINGS  Left Ventricle: Left ventricular ejection fraction, by estimation, is 60 to 65%. The left ventricle has normal function. The left ventricle has no regional wall motion abnormalities. The left ventricular internal cavity size was normal in size. There is  mild left ventricular hypertrophy. Left ventricular diastolic parameters are consistent with Grade III diastolic dysfunction (restrictive). Elevated left atrial pressure. Right Ventricle: The right ventricular size is normal. No increase in right ventricular wall thickness. Right ventricular systolic function is normal. Tricuspid regurgitation signal is inadequate for assessing PA pressure. Left Atrium: Left atrial size was mildly dilated. Right Atrium: Right atrial size was normal in size. Pericardium: A small pericardial effusion is present. Mitral Valve: The mitral valve is degenerative in appearance. Moderate mitral annular calcification. Trivial mitral valve regurgitation. No evidence of mitral valve stenosis. MV peak gradient, 7.6 mmHg. The mean mitral valve gradient is 2.0 mmHg. Tricuspid Valve: The tricuspid valve is normal in structure. Tricuspid valve regurgitation is trivial. Aortic Valve: The aortic valve is tricuspid. Aortic valve regurgitation is not visualized. No aortic stenosis is present. Aortic valve mean gradient measures 5.0 mmHg. Aortic valve peak gradient measures 8.9  mmHg. Aortic valve area, by VTI measures 3.03 cm. Pulmonic Valve: The pulmonic valve was not well visualized. Pulmonic valve regurgitation is not visualized. Aorta: The aortic root and ascending aorta are structurally normal, with no evidence of dilitation. IAS/Shunts: The interatrial septum was not well visualized.  LEFT VENTRICLE PLAX 2D LVIDd:         5.20 cm   Diastology LVIDs:         3.20 cm   LV e' medial:    6.85 cm/s LV PW:         1.10 cm   LV E/e' medial:  18.7 LV IVS:        0.70 cm   LV e' lateral:   4.68 cm/s LVOT diam:     2.00 cm   LV E/e' lateral: 27.4 LV SV:         98 LV SV Index:   54 LVOT Area:     3.14 cm  RIGHT VENTRICLE         IVC TAPSE (M-mode): 1.6 cm  IVC diam: 2.40 cm LEFT ATRIUM             Index        RIGHT ATRIUM           Index LA Vol (A2C):   59.3 ml 32.90 ml/m  RA Area:     17.20 cm LA Vol (A4C):   56.4 ml 31.29 ml/m  RA Volume:   43.20 ml  23.97  ml/m LA Biplane Vol: 62.9 ml 34.90 ml/m  AORTIC VALVE AV Area (Vmax):    2.68 cm AV Area (Vmean):   2.68 cm AV Area (VTI):     3.03 cm AV Vmax:           149.00 cm/s AV Vmean:          100.000 cm/s AV VTI:            0.324 m AV Peak Grad:      8.9 mmHg AV Mean Grad:      5.0 mmHg LVOT Vmax:         127.00 cm/s LVOT Vmean:        85.300 cm/s LVOT VTI:          0.312 m LVOT/AV VTI ratio: 0.96  AORTA Ao Root diam: 2.40 cm Ao Asc diam:  3.00 cm MITRAL VALVE MV Area (PHT): 4.31 cm     SHUNTS MV Area VTI:   2.62 cm     Systemic VTI:  0.31 m MV Peak grad:  7.6 mmHg     Systemic Diam: 2.00 cm MV Mean grad:  2.0 mmHg MV Vmax:       1.38 m/s MV Vmean:      62.4 cm/s MV Decel Time: 176 msec MV E velocity: 128.00 cm/s MV A velocity: 49.80 cm/s MV E/A ratio:  2.57 Epifanio Lesches MD Electronically signed by Epifanio Lesches MD Signature Date/Time: 11/19/2023/6:35:52 PM    Final    CT Angio Chest PE W and/or Wo Contrast Result Date: 11/18/2023 CLINICAL DATA:  Pulmonary embolism (PE) suspected, high prob fever, cough. EXAM: CT  ANGIOGRAPHY CHEST WITH CONTRAST TECHNIQUE: Multidetector CT imaging of the chest was performed using the standard protocol during bolus administration of intravenous contrast. Multiplanar CT image reconstructions and MIPs were obtained to evaluate the vascular anatomy. RADIATION DOSE REDUCTION: This exam was performed according to the departmental dose-optimization program which includes automated exposure control, adjustment of the mA and/or kV according to patient size and/or use of iterative reconstruction technique. CONTRAST:  OMNIPAQUE IOHEXOL 350 MG/ML SOLN COMPARISON:  07/10/2022 FINDINGS: Cardiovascular: No filling defects in the pulmonary arteries to suggest pulmonary emboli. Heart mildly enlarged. Aorta normal caliber. Scattered coronary artery and aortic atherosclerosis. Mediastinum/Nodes: Right paratracheal lymph node has a short axis diameter of 1.6 cm compared to 1.0 cm previously. No axillary or hilar adenopathy. Trachea and esophagus are unremarkable. Thyroid unremarkable. Lungs/Pleura: Small right pleural effusion. Dependent atelectasis in the lower lobes. No confluent opacities. Upper Abdomen: No acute findings Musculoskeletal: Chest wall soft tissues are unremarkable. No acute bony abnormality. Review of the MIP images confirms the above findings. IMPRESSION: No evidence of pulmonary embolus. Coronary artery disease. Mediastinal adenopathy with mildly enlarged right paratracheal lymph node, slightly larger than prior study. This could be reactive. This could be followed with repeat CT in 6 months to ensure stability. Small right pleural effusion. Aortic Atherosclerosis (ICD10-I70.0). Electronically Signed   By: Charlett Nose M.D.   On: 11/18/2023 20:23   DG Chest 2 View Result Date: 11/18/2023 CLINICAL DATA:  Suspected sepsis.  Fever.  Weakness. EXAM: CHEST - 2 VIEW COMPARISON:  07/15/2023. FINDINGS: Low lung volume. Bilateral lung fields are clear. Bilateral costophrenic angles are clear.  Stable cardio-mediastinal silhouette. No acute osseous abnormalities. The soft tissues are within normal limits. IMPRESSION: No active cardiopulmonary disease. Electronically Signed   By: Jules Schick M.D.   On: 11/18/2023 18:22        Scheduled  Meds:  amLODipine  5 mg Oral Daily   vitamin B-12  500 mcg Oral Daily   doxazosin  8 mg Oral Daily   enoxaparin (LOVENOX) injection  40 mg Subcutaneous Q24H   escitalopram  20 mg Oral Daily   ferrous sulfate  325 mg Oral Q breakfast   furosemide  20 mg Oral Daily   insulin aspart  0-9 Units Subcutaneous TID WC   levothyroxine  75 mcg Oral Q0600   lisinopril  40 mg Oral Daily   metoprolol succinate  100 mg Oral Daily   potassium chloride  40 mEq Oral Once   rosuvastatin  20 mg Oral QHS   Continuous Infusions:  cefTRIAXone (ROCEPHIN)  IV 1 g (11/19/23 1846)   doxycycline (VIBRAMYCIN) IV 100 mg (11/20/23 0829)     LOS: 1 day    Time spent: 35 minutes    Remell Giaimo A Anna Beaird, MD [facility]   If 7PM-7AM, please contact night-coverage [email or web address]  11/20/2023, 12:30 PM

## 2023-11-20 NOTE — Progress Notes (Signed)

## 2023-11-20 NOTE — Plan of Care (Signed)
  Problem: Clinical Measurements: Goal: Will remain free from infection Outcome: Progressing Goal: Diagnostic test results will improve Outcome: Progressing Goal: Respiratory complications will improve Outcome: Progressing Goal: Cardiovascular complication will be avoided Outcome: Progressing   Problem: Nutrition: Goal: Adequate nutrition will be maintained Outcome: Progressing   Problem: Elimination: Goal: Will not experience complications related to bowel motility Outcome: Progressing   Problem: Safety: Goal: Ability to remain free from injury will improve Outcome: Progressing   Problem: Skin Integrity: Goal: Risk for impaired skin integrity will decrease Outcome: Progressing

## 2023-11-20 NOTE — Plan of Care (Addendum)
 VSS. No c/o pain. LBM 4/2. Patient remains on 2L [place]. No acute events overnight.  Problem: Education: Goal: Knowledge of General Education information will improve Description: Including pain rating scale, medication(s)/side effects and non-pharmacologic comfort measures Outcome: Progressing   Problem: Clinical Measurements: Goal: Ability to maintain clinical measurements within normal limits will improve Outcome: Progressing Goal: Will remain free from infection Outcome: Progressing Goal: Respiratory complications will improve Outcome: Progressing   Problem: Activity: Goal: Risk for activity intolerance will decrease Outcome: Progressing   Problem: Safety: Goal: Ability to remain free from injury will improve Outcome: Progressing   Problem: Fluid Volume: Goal: Ability to maintain a balanced intake and output will improve Outcome: Progressing   Problem: Metabolic: Goal: Ability to maintain appropriate glucose levels will improve Outcome: Progressing   Problem: Nutritional: Goal: Maintenance of adequate nutrition will improve Outcome: Progressing   Problem: Tissue Perfusion: Goal: Adequacy of tissue perfusion will improve Outcome: Progressing

## 2023-11-21 DIAGNOSIS — I509 Heart failure, unspecified: Secondary | ICD-10-CM | POA: Diagnosis not present

## 2023-11-21 DIAGNOSIS — R0902 Hypoxemia: Secondary | ICD-10-CM | POA: Diagnosis not present

## 2023-11-21 LAB — CBC
HCT: 24.2 % — ABNORMAL LOW (ref 36.0–46.0)
Hemoglobin: 7.4 g/dL — ABNORMAL LOW (ref 12.0–15.0)
MCH: 25.9 pg — ABNORMAL LOW (ref 26.0–34.0)
MCHC: 30.6 g/dL (ref 30.0–36.0)
MCV: 84.6 fL (ref 80.0–100.0)
Platelets: 173 10*3/uL (ref 150–400)
RBC: 2.86 MIL/uL — ABNORMAL LOW (ref 3.87–5.11)
RDW: 14.3 % (ref 11.5–15.5)
WBC: 6 10*3/uL (ref 4.0–10.5)
nRBC: 0 % (ref 0.0–0.2)

## 2023-11-21 LAB — GLUCOSE, CAPILLARY
Glucose-Capillary: 180 mg/dL — ABNORMAL HIGH (ref 70–99)
Glucose-Capillary: 183 mg/dL — ABNORMAL HIGH (ref 70–99)
Glucose-Capillary: 189 mg/dL — ABNORMAL HIGH (ref 70–99)
Glucose-Capillary: 201 mg/dL — ABNORMAL HIGH (ref 70–99)

## 2023-11-21 LAB — PREPARE RBC (CROSSMATCH)

## 2023-11-21 LAB — HEMOGLOBIN AND HEMATOCRIT, BLOOD
HCT: 30.9 % — ABNORMAL LOW (ref 36.0–46.0)
Hemoglobin: 9.7 g/dL — ABNORMAL LOW (ref 12.0–15.0)

## 2023-11-21 LAB — OCCULT BLOOD X 1 CARD TO LAB, STOOL: Fecal Occult Bld: NEGATIVE

## 2023-11-21 MED ORDER — SODIUM CHLORIDE 0.9% IV SOLUTION: Freq: Once | INTRAVENOUS | Status: AC

## 2023-11-21 MED ORDER — FUROSEMIDE 10 MG/ML IJ SOLN
20.0000 mg | Freq: Once | INTRAMUSCULAR | Status: AC
  Administered 2023-11-21: 20 mg via INTRAVENOUS
  Filled 2023-11-21: qty 2

## 2023-11-21 MED ORDER — DOXYCYCLINE HYCLATE 100 MG PO TABS
100.0000 mg | ORAL_TABLET | Freq: Two times a day (BID) | ORAL | Status: DC
Start: 2023-11-21 — End: 2023-11-23
  Administered 2023-11-21 – 2023-11-23 (×4): 100 mg via ORAL
  Filled 2023-11-21 (×4): qty 1

## 2023-11-21 NOTE — Plan of Care (Signed)

## 2023-11-21 NOTE — Progress Notes (Signed)
 Physical Therapy Treatment Patient Details Name: Kristi Willis MRN: [id number] DOB: 1941/03/19 Today's Date: 11/21/2023   History of Present Illness Kristi Willis is an 83 yo admitted with fever.  Respiratory viral panel came back positive for coronavirus (not COVID-19). PMH: asthma, DM type II, hypothyroidism, HTN, speech articulation disorder/aphasia, anemia of chronic disease, and hyperlipidemia    PT Comments  Pt agreeable to therapy, on RA with SpO2 94% at rest and SpO2 92-93% with ambulation. Pt amb without AD, good step through gait pattern, flat foot posture with slight increased lateral weight shifting, able to navigate around objects in room/bathroom and hallway without LOB. Returned to room with lunch present and daughter left to run errands.   If plan is discharge home, recommend the following: Assistance with cooking/housework;Assist for transportation;Help with stairs or ramp for entrance   Can travel by private vehicle        Equipment Recommendations  None recommended by PT    Recommendations for Other Services       Precautions / Restrictions Precautions Precautions: Fall Precaution/Restrictions Comments: monitor O2 Restrictions Weight Bearing Restrictions Per Provider Order: No     Mobility  Bed Mobility               General bed mobility comments: in recliner upon arrival    Transfers Overall transfer level: Needs assistance Equipment used: None Transfers: Sit to/from Stand Sit to Stand: Supervision           General transfer comment: slow mobility, no physical assist    Ambulation/Gait Ambulation/Gait assistance: Supervision Gait Distance (Feet): 400 Feet Assistive device: None Gait Pattern/deviations: Step-through pattern, Decreased stride length Gait velocity: decreased     General Gait Details: step through gait pattern in room and in hallway without AD, slightly increased lateral weight shift, flat foot posture, no SOB  noted, on RA with SpO2 92-93% with amb   Stairs             Wheelchair Mobility     Tilt Bed    Modified Rankin (Stroke Patients Only)       Balance Overall balance assessment: No apparent balance deficits (not formally assessed)                                          Communication Communication Communication: Impaired (aphasia baseline)  Cognition Arousal: Alert Behavior During Therapy: WFL for tasks assessed/performed   PT - Cognitive impairments: No apparent impairments                       PT - Cognition Comments: written communication with yes/no or 2 choices, pleasant, smiles Following commands: Intact      Cueing Cueing Techniques: Gestural cues  Exercises      General Comments General comments (skin integrity, edema, etc.): Pt on RA with SpO2 94% at rest and 92-93% with ambulation- RN notified      Pertinent Vitals/Pain Pain Assessment Pain Assessment: Faces Faces Pain Scale: No hurt    Home Living                          Prior Function            PT Goals (current goals can now be found in the care plan section) Acute Rehab PT Goals Patient Stated Goal: "walk as much as  she can" per daughter PT Goal Formulation: With patient/family Time For Goal Achievement: 12/03/23 Potential to Achieve Goals: Good Progress towards PT goals: Progressing toward goals    Frequency    Min 3X/week      PT Plan      Co-evaluation              AM-PAC PT "6 Clicks" Mobility   Outcome Measure  Help needed turning from your back to your side while in a flat bed without using bedrails?: A Little Help needed moving from lying on your back to sitting on the side of a flat bed without using bedrails?: A Little Help needed moving to and from a bed to a chair (including a wheelchair)?: A Little Help needed standing up from a chair using your arms (e.g., wheelchair or bedside chair)?: A Little Help needed to  walk in hospital room?: A Little Help needed climbing 3-5 steps with a railing? : A Little 6 Click Score: 18    End of Session Equipment Utilized During Treatment: Gait belt Activity Tolerance: Patient tolerated treatment well Patient left: in chair;with call bell/phone within reach;with chair alarm set Nurse Communication: Mobility status;Other (comment) (SPO2) PT Visit Diagnosis: Other abnormalities of gait and mobility (R26.89)     Time: 4034-7425 PT Time Calculation (min) (ACUTE ONLY): 15 min  Charges:    $Gait Training: 8-22 mins PT General Charges $$ ACUTE PT VISIT: 1 Visit                     Tori Syre Knerr PT, DPT 11/21/23, 1:29 PM

## 2023-11-21 NOTE — Progress Notes (Signed)
 PROGRESS NOTE    Kristi Willis  JXB:[id number] DOB: 1941/04/04 DOA: 11/18/2023 PCP: Junie Spencer, FNP   Brief Narrative: 83 year old with past medical history significant for primary progressive aphasia, diabetes type 2, hypertension, hypothyroidism presented to the ED accompanied by daughter due to new development of fever and progressive generalized weakness.  Patient had gone for her dentist appointment and following which patient was found to have fever.  Patient has been having nonproductive cough over the last couple of days.  Evaluation in the ED she had a fever 101, COVID and flu negative, coronavirus 43 positive.  UA with finding concerning for UTI.   Assessment & Plan:   Principal Problem:   Fever Active Problems:   Hyperlipidemia associated with type 2 diabetes mellitus (HCC)   Diabetes mellitus (HCC)   Essential hypertension   Speech articulation disorder   Hypothyroidism   Iron deficiency anemia   Primary progressive aphasia (HCC)   URI (upper respiratory infection)   UTI (urinary tract infection)   Weakness   Hypoxia  1-Fever, generalized weakness: In the setting of UTI and coronavirus 43 not COVID-19 -Supportive care -Continue with IV antibiotics -Urine culture: no growth to date  2-UTI; Presents with Fever, UA with 21-50 WBC.  Urine culture no growth to date Continue with IV ceftriaxone.   3-Hypertension: Continue amlodipine, lisinopril, Toprol and Cardura  Diabetes Type II: Takes Januvia and metformin at home hold for now.  Continue with sliding scale insulin  Acute Hypoxia; Small right pleural effusion:  Acute Bronchitis secondary to coronavirus 43.   Stop IV fluids Oxygen 90 % RA. Per nurse oxygen drop to high 80 overnight, currently on 2 L.  -Stop fluids.  ECHO; showed diastolic Dysfunction grade 3.  Chest x ray cardiomegaly, atelectasis.  Started  low dose lasix.  Schedule nebulizer/   Hyperlipidemia: Continue with the  statins  Hypothyroidism: Continue with Synthroid  Anemia iron deficiency and low normal B12: Started B12 supplementation and iron supplementation Hb down to 7.4. Suspect anemia of acute illness as well.  Occult blood negative.  Plan to proceed with one unit PRBC, IV lasix post transfusion.   Primary progressive aphasia: Continue with supportive care Mediastinal adenopathy mildly enlarged right paratracheal lymph node.  Slightly larger than previous studies.  She will need follow-up CT chest 6 months  Estimated body mass index is 28.31 kg/m as calculated from the following:   Height as of this encounter: 5\' 4"  (1.626 m).   Weight as of this encounter: 74.8 kg.   DVT prophylaxis: Lovenox Code Status: Full code Family Communication: Daughter at bedside Disposition Plan:  Status is: Observation The patient will require care spanning > 2 midnights and should be moved to inpatient because: Management of UTI      Procedures:  ECHO  Antimicrobials:    Subjective: Alert, non verbal. Daughter at bedside.  She hasn't walk today, hasn't had any wheezing   Objective: Vitals:   11/21/23 1515 11/21/23 1524 11/21/23 1537 11/21/23 1543  BP: (!) 151/44 (!) 151/44  (!) 158/49  Pulse: 68 68  65  Resp:  18    Temp: 99.2 F (37.3 C) 99.2 F (37.3 C)  98.1 F (36.7 C)  TempSrc: Oral Oral  Axillary  SpO2: 95%  96% 100%  Weight:      Height:        Intake/Output Summary (Last 24 hours) at 11/21/2023 1612 Last data filed at 11/20/2023 2125 Gross per 24 hour  Intake 10 ml  Output --  Net 10 ml   Filed Weights   11/19/23 0106  Weight: 74.8 kg    Examination:  General exam: NAD Respiratory system: Few wheezing.  Cardiovascular system: S 1, S 2 RRR Gastrointestinal system: BS present,soft, nt Central nervous system: Alert Extremities: no edema    Data Reviewed: I have personally reviewed following labs and imaging studies  CBC: Recent Labs  Lab 11/18/23 1709  11/19/23 0600 11/20/23 0516 11/21/23 0321  WBC 13.9* 11.1* 8.0 6.0  NEUTROABS 12.1* 7.9*  --   --   HGB 8.8* 9.2* 7.6* 7.4*  HCT 27.5* 30.1* 25.2* 24.2*  MCV 81.4 84.3 86.0 84.6  PLT 219 232 184 173   Basic Metabolic Panel: Recent Labs  Lab 11/18/23 1709 11/19/23 0600 11/20/23 0516  NA 137 136 135  K 3.9 3.6 3.5  CL 101 102 103  CO2 24 23 21*  GLUCOSE 178* 189* 197*  BUN 13 10 12   CREATININE 0.56 0.58 0.65  CALCIUM 8.9 8.8* 8.1*   GFR: Estimated Creatinine Clearance: 53.7 mL/min (by C-G formula based on SCr of 0.65 mg/dL). Liver Function Tests: Recent Labs  Lab 11/18/23 1709 11/19/23 0600  AST 12* 21  ALT 10 13  ALKPHOS 74 74  BILITOT 0.8 0.9  PROT 6.7 7.0  ALBUMIN 4.1 3.8   No results for input(s): "LIPASE", "AMYLASE" in the last 168 hours. No results for input(s): "AMMONIA" in the last 168 hours. Coagulation Profile: Recent Labs  Lab 11/18/23 1709  INR 1.1   Cardiac Enzymes: No results for input(s): "CKTOTAL", "CKMB", "CKMBINDEX", "TROPONINI" in the last 168 hours. BNP (last 3 results) No results for input(s): "PROBNP" in the last 8760 hours. HbA1C: Recent Labs    11/19/23 0600  HGBA1C 7.1*   CBG: Recent Labs  Lab 11/20/23 1239 11/20/23 1653 11/20/23 2046 11/21/23 0810 11/21/23 1245  GLUCAP 214* 169* 212* 180* 183*   Lipid Profile: No results for input(s): "CHOL", "HDL", "LDLCALC", "TRIG", "CHOLHDL", "LDLDIRECT" in the last 72 hours. Thyroid Function Tests: Recent Labs    11/19/23 0625  TSH 1.140   Anemia Panel: Recent Labs    11/19/23 0625  VITAMINB12 200  FOLATE 26.5  FERRITIN 34  TIBC 403  IRON 23*  RETICCTPCT 2.0   Sepsis Labs: Recent Labs  Lab 11/18/23 1709  LATICACIDVEN 1.7    Recent Results (from the past 240 hours)  Culture, blood (Routine x 2)     Status: None (Preliminary result)   Collection Time: 11/18/23  4:54 PM   Specimen: BLOOD  Result Value Ref Range Status   Specimen Description   Final    BLOOD  LEFT ANTECUBITAL Performed at [facility], [address], [place], [place] [address]    Special Requests   Final    Blood Culture adequate volume BOTTLES DRAWN AEROBIC AND ANAEROBIC Performed at [facility], [address], [place], [place] [address]    Culture   Final    NO GROWTH 3 DAYS Performed at [facility], [address] [place], [place] [address]    Report Status PENDING  Incomplete  Culture, blood (Routine x 2)     Status: None (Preliminary result)   Collection Time: 11/18/23  4:59 PM   Specimen: BLOOD RIGHT FOREARM  Result Value Ref Range Status   Specimen Description   Final    BLOOD RIGHT FOREARM Performed at [facility], [address], [place], [place] [address]    Special Requests  Final    Blood Culture adequate volume BOTTLES DRAWN AEROBIC AND ANAEROBIC Performed at [facility], [address] [place], [place] [address]    Culture   Final    NO GROWTH 3 DAYS Performed at [facility], [address], [place], [place] [address]    Report Status PENDING  Incomplete  Resp panel by RT-PCR (RSV, Flu A&B, Covid) Anterior Nasal Swab     Status: None   Collection Time: 11/18/23  5:09 PM   Specimen: Anterior Nasal Swab  Result Value Ref Range Status   SARS Coronavirus 2 by RT PCR NEGATIVE NEGATIVE Final    Comment: (NOTE) SARS-CoV-2 target nucleic acids are NOT DETECTED.  The SARS-CoV-2 RNA is generally detectable in upper respiratory specimens during the acute phase of infection. The lowest concentration of SARS-CoV-2 viral copies this assay can detect is 138 copies/mL. A negative result does not preclude SARS-Cov-2 infection and should not be used as the sole basis for treatment or other patient management decisions. A negative result may occur with  improper specimen collection/handling, submission of specimen other than nasopharyngeal swab, presence of viral mutation(s)  within the areas targeted by this assay, and inadequate number of viral copies(<138 copies/mL). A negative result must be combined with clinical observations, patient history, and epidemiological information. The expected result is Negative.  Fact Sheet for Patients:  BloggerCourse.com  Fact Sheet for Healthcare Providers:  SeriousBroker.it  This test is no t yet approved or cleared by the [place] FDA and  has been authorized for detection and/or diagnosis of SARS-CoV-2 by FDA under an Emergency Use Authorization (EUA). This EUA will remain  in effect (meaning this test can be used) for the duration of the COVID-19 declaration under Section 564(b)(1) of the Act, 21 U.S.C.section 360bbb-3(b)(1), unless the authorization is terminated  or revoked sooner.       Influenza A by PCR NEGATIVE NEGATIVE Final   Influenza B by PCR NEGATIVE NEGATIVE Final    Comment: (NOTE) The Xpert Xpress SARS-CoV-2/FLU/RSV plus assay is intended as an aid in the diagnosis of influenza from Nasopharyngeal swab specimens and should not be used as a sole basis for treatment. Nasal washings and aspirates are unacceptable for Xpert Xpress SARS-CoV-2/FLU/RSV testing.  Fact Sheet for Patients: BloggerCourse.com  Fact Sheet for Healthcare Providers: SeriousBroker.it  This test is not yet approved or cleared by the [place] FDA and has been authorized for detection and/or diagnosis of SARS-CoV-2 by FDA under an Emergency Use Authorization (EUA). This EUA will remain in effect (meaning this test can be used) for the duration of the COVID-19 declaration under Section 564(b)(1) of the Act, 21 U.S.C. section 360bbb-3(b)(1), unless the authorization is terminated or revoked.     Resp Syncytial Virus by PCR NEGATIVE NEGATIVE Final    Comment: (NOTE) Fact Sheet for  Patients: BloggerCourse.com  Fact Sheet for Healthcare Providers: SeriousBroker.it  This test is not yet approved or cleared by the [place] FDA and has been authorized for detection and/or diagnosis of SARS-CoV-2 by FDA under an Emergency Use Authorization (EUA). This EUA will remain in effect (meaning this test can be used) for the duration of the COVID-19 declaration under Section 564(b)(1) of the Act, 21 U.S.C. section 360bbb-3(b)(1), unless the authorization is terminated or revoked.  Performed at [facility], [address], [place], [place] [address]   Respiratory (~20 pathogens) panel by PCR     Status: Abnormal   Collection Time: 11/19/23  1:38  AM   Specimen: Nasopharyngeal Swab; Respiratory  Result Value Ref Range Status   Adenovirus NOT DETECTED NOT DETECTED Final   Coronavirus 229E NOT DETECTED NOT DETECTED Final    Comment: (NOTE) The Coronavirus on the Respiratory Panel, DOES NOT test for the novel  Coronavirus (2019 nCoV)    Coronavirus HKU1 NOT DETECTED NOT DETECTED Final   Coronavirus NL63 NOT DETECTED NOT DETECTED Final   Coronavirus OC43 DETECTED (A) NOT DETECTED Final   Metapneumovirus NOT DETECTED NOT DETECTED Final   Rhinovirus / Enterovirus NOT DETECTED NOT DETECTED Final   Influenza A NOT DETECTED NOT DETECTED Final   Influenza B NOT DETECTED NOT DETECTED Final   Parainfluenza Virus 1 NOT DETECTED NOT DETECTED Final   Parainfluenza Virus 2 NOT DETECTED NOT DETECTED Final   Parainfluenza Virus 3 NOT DETECTED NOT DETECTED Final   Parainfluenza Virus 4 NOT DETECTED NOT DETECTED Final   Respiratory Syncytial Virus NOT DETECTED NOT DETECTED Final   Bordetella pertussis NOT DETECTED NOT DETECTED Final   Bordetella Parapertussis NOT DETECTED NOT DETECTED Final   Chlamydophila pneumoniae NOT DETECTED NOT DETECTED Final   Mycoplasma pneumoniae NOT DETECTED NOT DETECTED Final     Comment: Performed at [facility], [address], [place], [place] [address]  Urine Culture     Status: None   Collection Time: 11/19/23  2:37 AM   Specimen: Urine, Random  Result Value Ref Range Status   Specimen Description   Final    URINE, RANDOM Performed at [facility], [address] [place], [place] [address]    Special Requests   Final    NONE Reflexed from [id number] Performed at [facility], [address] [place], [place] [address]    Culture   Final    NO GROWTH Performed at [facility], [address], [place], [place] [address]    Report Status 11/20/2023 FINAL  Final         Radiology Studies: DG CHEST PORT 1 VIEW Result Date: 11/20/2023 CLINICAL DATA:  Hypoxia. EXAM: PORTABLE CHEST 1 VIEW COMPARISON:  November 18, 2023. FINDINGS: Mild cardiomegaly is noted. Minimal bibasilar subsegmental atelectasis is noted. Bony thorax is unremarkable. IMPRESSION: Minimal bibasilar subsegmental atelectasis. Electronically Signed   By: Lupita Raider M.D.   On: 11/20/2023 11:14        Scheduled Meds:  sodium chloride   Intravenous Once   amLODipine  5 mg Oral Daily   budesonide (PULMICORT) nebulizer solution  0.25 mg Nebulization BID   vitamin B-12  500 mcg Oral Daily   doxazosin  8 mg Oral Daily   doxycycline  100 mg Oral Q12H   escitalopram  20 mg Oral Daily   furosemide  20 mg Intravenous Once   furosemide  20 mg Oral Daily   insulin aspart  0-9 Units Subcutaneous TID WC   ipratropium-albuterol  3 mL Nebulization TID   levothyroxine  75 mcg Oral Q0600   lisinopril  40 mg Oral Daily   metoprolol succinate  100 mg Oral Daily   rosuvastatin  20 mg Oral QHS   sodium chloride flush  10-40 mL Intracatheter Q12H   Continuous Infusions:  cefTRIAXone (ROCEPHIN)  IV 1 g (11/20/23 1614)     LOS: 2 days    Time spent: 35 minutes    Feleica Fulmore A Mira Balon, MD [facility]   If 7PM-7AM, please contact  night-coverage [email or web address]  11/21/2023, 4:12 PM

## 2023-11-21 NOTE — Plan of Care (Addendum)
 VSS. No c/o pain. BG 212 at bedtime. LBM 4/4. Patient remains on 2L [place]. No acute events overnight.   Problem: Clinical Measurements: Goal: Ability to maintain clinical measurements within normal limits will improve Outcome: Progressing   Problem: Education: Goal: Knowledge of General Education information will improve Description: Including pain rating scale, medication(s)/side effects and non-pharmacologic comfort measures Outcome: Progressing  Goal: Will remain free from infection Outcome: Progressing Goal: Respiratory complications will improve Outcome: Progressing   Problem: Safety: Goal: Ability to remain free from injury will improve Outcome: Progressing   Problem: Education: Goal: Ability to describe self-care measures that may prevent or decrease complications (Diabetes Survival Skills Education) will improve Outcome: Progressing   Problem: Fluid Volume: Goal: Ability to maintain a balanced intake and output will improve Outcome: Progressing   Problem: Metabolic: Goal: Ability to maintain appropriate glucose levels will improve Outcome: Progressing   Problem: Nutritional: Goal: Maintenance of adequate nutrition will improve Outcome: Progressing

## 2023-11-22 DIAGNOSIS — I509 Heart failure, unspecified: Secondary | ICD-10-CM | POA: Diagnosis not present

## 2023-11-22 DIAGNOSIS — R0902 Hypoxemia: Secondary | ICD-10-CM | POA: Diagnosis not present

## 2023-11-22 LAB — TYPE AND SCREEN
ABO/RH(D): A NEG
Antibody Screen: NEGATIVE
Unit division: 0

## 2023-11-22 LAB — BPAM RBC
Blood Product Expiration Date: 202505042359
ISSUE DATE / TIME: 202504041510
Unit Type and Rh: 600

## 2023-11-22 LAB — CBC
HCT: 29.8 % — ABNORMAL LOW (ref 36.0–46.0)
Hemoglobin: 9.2 g/dL — ABNORMAL LOW (ref 12.0–15.0)
MCH: 26 pg (ref 26.0–34.0)
MCHC: 30.9 g/dL (ref 30.0–36.0)
MCV: 84.2 fL (ref 80.0–100.0)
Platelets: 209 10*3/uL (ref 150–400)
RBC: 3.54 MIL/uL — ABNORMAL LOW (ref 3.87–5.11)
RDW: 14.3 % (ref 11.5–15.5)
WBC: 6.9 10*3/uL (ref 4.0–10.5)
nRBC: 0 % (ref 0.0–0.2)

## 2023-11-22 LAB — BASIC METABOLIC PANEL WITH GFR
Anion gap: 12 (ref 5–15)
BUN: 10 mg/dL (ref 8–23)
CO2: 23 mmol/L (ref 22–32)
Calcium: 8.3 mg/dL — ABNORMAL LOW (ref 8.9–10.3)
Chloride: 96 mmol/L — ABNORMAL LOW (ref 98–111)
Creatinine, Ser: 0.51 mg/dL (ref 0.44–1.00)
GFR, Estimated: >60 mL/min (ref >60)
Glucose, Bld: 222 mg/dL — ABNORMAL HIGH (ref 70–99)
Potassium: 3.1 mmol/L — ABNORMAL LOW (ref 3.5–5.1)
Sodium: 131 mmol/L — ABNORMAL LOW (ref 135–145)

## 2023-11-22 LAB — GLUCOSE, CAPILLARY
Glucose-Capillary: 250 mg/dL — ABNORMAL HIGH (ref 70–99)
Glucose-Capillary: 233 mg/dL — ABNORMAL HIGH (ref 70–99)
Glucose-Capillary: 207 mg/dL — ABNORMAL HIGH (ref 70–99)
Glucose-Capillary: 173 mg/dL — ABNORMAL HIGH (ref 70–99)

## 2023-11-22 MED ORDER — FUROSEMIDE 40 MG PO TABS: 40.0000 mg | ORAL_TABLET | Freq: Every day | ORAL | Status: DC

## 2023-11-22 MED ORDER — FUROSEMIDE 20 MG PO TABS
20.0000 mg | ORAL_TABLET | Freq: Once | ORAL | Status: AC
  Administered 2023-11-22: 20 mg via ORAL
  Filled 2023-11-22: qty 1

## 2023-11-22 MED ORDER — LINAGLIPTIN 5 MG PO TABS
5.0000 mg | ORAL_TABLET | Freq: Every day | ORAL | Status: DC
  Administered 2023-11-22 – 2023-11-23 (×2): 5 mg via ORAL
  Filled 2023-11-22 (×2): qty 1

## 2023-11-22 MED ORDER — POTASSIUM CHLORIDE CRYS ER 20 MEQ PO TBCR
40.0000 meq | EXTENDED_RELEASE_TABLET | Freq: Once | ORAL | Status: AC
  Administered 2023-11-22: 40 meq via ORAL
  Filled 2023-11-22: qty 2

## 2023-11-22 MED ORDER — ASPIRIN 81 MG PO CHEW: 81.0000 mg | CHEWABLE_TABLET | Freq: Every day | ORAL | Status: DC

## 2023-11-22 NOTE — Progress Notes (Signed)
 Mobility Specialist - Progress Note   11/22/23 1020  Oxygen Therapy  SpO2 91 %  O2 Device Room Air  Mobility  Activity Ambulated independently in hallway  Level of Assistance Independent  Assistive Device None  Distance Ambulated (ft) 500 ft  Activity Response Tolerated well  Mobility Referral Yes  Mobility visit 1 Mobility  Mobility Specialist Start Time (ACUTE ONLY) 1008  Mobility Specialist Stop Time (ACUTE ONLY) 1020  Mobility Specialist Time Calculation (min) (ACUTE ONLY) 12 min   Pt received in recliner and agreeable to mobility. No complaints during session. Pt to recliner after session with all needs met.    During mobility: 91% SpO2 (RA) Post-mobility: 92% SPO2 (RA)  Chief Technology Officer

## 2023-11-22 NOTE — Plan of Care (Addendum)
 BG 201 at bedtime. LBM 4/4. Patient had nose bleed at approximately 0155. No other evidence of bleeding this shift. Patient wheezing after ambulating to bathroom, placed on 2L [place] and given PRN neb.   Problem: Education: Goal: Knowledge of General Education information will improve Description: Including pain rating scale, medication(s)/side effects and non-pharmacologic comfort measures Outcome: Progressing   Problem: Clinical Measurements: Goal: Ability to maintain clinical measurements within normal limits will improve Outcome: Progressing Goal: Will remain free from infection Outcome: Progressing Goal: Respiratory complications will improve Outcome: Progressing   Problem: Safety: Goal: Ability to remain free from injury will improve Outcome: Progressing   Problem: Metabolic: Goal: Ability to maintain appropriate glucose levels will improve Outcome: Progressing   Problem: Nutritional: Goal: Maintenance of adequate nutrition will improve Outcome: Progressing

## 2023-11-22 NOTE — Progress Notes (Addendum)
 PROGRESS NOTE    Kristi Willis  WUJ:[id number] DOB: 09-07-1940 DOA: 11/18/2023 PCP: Junie Spencer, FNP   Brief Narrative: 83 year old with past medical history significant for primary progressive aphasia, diabetes type 2, hypertension, hypothyroidism presented to the ED accompanied by daughter due to new development of fever and progressive generalized weakness.  Patient had gone for her dentist appointment and following which patient was found to have fever.  Patient has been having nonproductive cough over the last couple of days.  Evaluation in the ED she had a fever 101, COVID and flu negative, coronavirus 43 positive.  UA with finding concerning for UTI.   Assessment & Plan:   Principal Problem:   Fever Active Problems:   Hyperlipidemia associated with type 2 diabetes mellitus (HCC)   Diabetes mellitus (HCC)   Essential hypertension   Speech articulation disorder   Hypothyroidism   Iron deficiency anemia   Primary progressive aphasia (HCC)   URI (upper respiratory infection)   UTI (urinary tract infection)   Weakness   Hypoxia  1-Fever, generalized weakness: In the setting of UTI and coronavirus 43 not COVID-19 -Supportive care -Continue with IV antibiotics -Urine culture: no growth to date  2-UTI; Presents with Fever, UA with 21-50 WBC.  Urine culture no growth to date Continue with IV ceftriaxone.   3-Hypertension: Continue amlodipine, lisinopril, Toprol and Cardura Added lasix, might have to increase lasix dose if BP remain elevated.   Diabetes Type II: Takes Januvia and metformin at home hold for now.  Continue with sliding scale insulin  Acute Hypoxia; Small right pleural effusion:  Acute Bronchitis secondary to coronavirus 43.   Stop IV fluids Oxygen 90 % RA. Per nurse oxygen drop to high 80 overnight, currently on 2 L.  -Stop fluids.  ECHO; showed diastolic Dysfunction grade 3.  Chest x ray cardiomegaly, atelectasis.  Started  low dose lasix.   Schedule nebulizer/   Hyperlipidemia: Continue with the statins  Hypothyroidism: Continue with Synthroid  Anemia iron deficiency and low normal B12: Started B12 supplementation and iron supplementation Hb down to 7.4. Suspect anemia of acute illness as well.  Occult blood negative.  Received one unit PRBC 4/04.  Primary progressive aphasia: Continue with supportive care Mediastinal adenopathy mildly enlarged right paratracheal lymph node.  Slightly larger than previous studies.  She will need follow-up CT chest 6 months Hypokalemia; Replete orally.  Hypoxia at Night; will order Nocturnal Pulse oxymetry.   Estimated body mass index is 28.31 kg/m as calculated from the following:   Height as of this encounter: 5\' 4"  (1.626 m).   Weight as of this encounter: 74.8 kg.   DVT prophylaxis: Lovenox Code Status: Full code Family Communication: Daughter at bedside Disposition Plan:  Status is: Observation The patient will require care spanning > 2 midnights and should be moved to inpatient because: Management of UTI      Procedures:  ECHO  Antimicrobials:    Subjective: Alert, non verbal.  Lungs clear, no wheezing.  Oxygen drop last night and was placed on Oxygen.   Objective: Vitals:   11/22/23 0555 11/22/23 0624 11/22/23 0910 11/22/23 1020  BP: (!) 180/72  (!) 180/72   Pulse: 74  74   Resp:      Temp: 99 F (37.2 C)     TempSrc: Oral     SpO2: 98% 99%  91%  Weight:      Height:        Intake/Output Summary (Last 24 hours) at 11/22/2023 1600  Last data filed at 11/21/2023 2144 Gross per 24 hour  Intake 395.42 ml  Output --  Net 395.42 ml   Filed Weights   11/19/23 0106  Weight: 74.8 kg    Examination:  General exam: NAD Respiratory system:  No wheezing. BL air movement Cardiovascular system: S 1, S 2 RRR Gastrointestinal system: BS present, soft, nt Central nervous system: Alert Extremities: no edema    Data Reviewed: I have personally reviewed  following labs and imaging studies  CBC: Recent Labs  Lab 11/18/23 1709 11/19/23 0600 11/20/23 0516 11/21/23 0321 11/21/23 2014 11/22/23 1355  WBC 13.9* 11.1* 8.0 6.0  --  6.9  NEUTROABS 12.1* 7.9*  --   --   --   --   HGB 8.8* 9.2* 7.6* 7.4* 9.7* 9.2*  HCT 27.5* 30.1* 25.2* 24.2* 30.9* 29.8*  MCV 81.4 84.3 86.0 84.6  --  84.2  PLT 219 232 184 173  --  209   Basic Metabolic Panel: Recent Labs  Lab 11/18/23 1709 11/19/23 0600 11/20/23 0516 11/22/23 1355  NA 137 136 135 131*  K 3.9 3.6 3.5 3.1*  CL 101 102 103 96*  CO2 24 23 21* 23  GLUCOSE 178* 189* 197* 222*  BUN 13 10 12 10   CREATININE 0.56 0.58 0.65 0.51  CALCIUM 8.9 8.8* 8.1* 8.3*   GFR: Estimated Creatinine Clearance: 53.7 mL/min (by C-G formula based on SCr of 0.51 mg/dL). Liver Function Tests: Recent Labs  Lab 11/18/23 1709 11/19/23 0600  AST 12* 21  ALT 10 13  ALKPHOS 74 74  BILITOT 0.8 0.9  PROT 6.7 7.0  ALBUMIN 4.1 3.8   No results for input(s): "LIPASE", "AMYLASE" in the last 168 hours. No results for input(s): "AMMONIA" in the last 168 hours. Coagulation Profile: Recent Labs  Lab 11/18/23 1709  INR 1.1   Cardiac Enzymes: No results for input(s): "CKTOTAL", "CKMB", "CKMBINDEX", "TROPONINI" in the last 168 hours. BNP (last 3 results) No results for input(s): "PROBNP" in the last 8760 hours. HbA1C: No results for input(s): "HGBA1C" in the last 72 hours.  CBG: Recent Labs  Lab 11/21/23 1245 11/21/23 1759 11/21/23 2110 11/22/23 0728 11/22/23 1118  GLUCAP 183* 189* 201* 250* 233*   Lipid Profile: No results for input(s): "CHOL", "HDL", "LDLCALC", "TRIG", "CHOLHDL", "LDLDIRECT" in the last 72 hours. Thyroid Function Tests: No results for input(s): "TSH", "T4TOTAL", "FREET4", "T3FREE", "THYROIDAB" in the last 72 hours.  Anemia Panel: No results for input(s): "VITAMINB12", "FOLATE", "FERRITIN", "TIBC", "IRON", "RETICCTPCT" in the last 72 hours.  Sepsis Labs: Recent Labs  Lab  11/18/23 1709  LATICACIDVEN 1.7    Recent Results (from the past 240 hours)  Culture, blood (Routine x 2)     Status: None (Preliminary result)   Collection Time: 11/18/23  4:54 PM   Specimen: BLOOD  Result Value Ref Range Status   Specimen Description   Final    BLOOD LEFT ANTECUBITAL Performed at [facility], [address] [place], [place] [address]    Special Requests   Final    Blood Culture adequate volume BOTTLES DRAWN AEROBIC AND ANAEROBIC Performed at [facility], [address] [place], [place] [address]    Culture   Final    NO GROWTH 4 DAYS Performed at [facility], [address], [place], [place] [address]    Report Status PENDING  Incomplete  Culture, blood (Routine x 2)     Status: None (Preliminary result)   Collection Time: 11/18/23  4:59 PM   Specimen: BLOOD RIGHT FOREARM  Result Value Ref Range Status   Specimen Description   Final    BLOOD RIGHT FOREARM Performed at [facility], [address], [place], [place] [address]    Special Requests   Final    Blood Culture adequate volume BOTTLES DRAWN AEROBIC AND ANAEROBIC Performed at [facility], [address] [place], [place] [address]    Culture   Final    NO GROWTH 4 DAYS Performed at [facility], [address], [place], [place] [address]    Report Status PENDING  Incomplete  Resp panel by RT-PCR (RSV, Flu A&B, Covid) Anterior Nasal Swab     Status: None   Collection Time: 11/18/23  5:09 PM   Specimen: Anterior Nasal Swab  Result Value Ref Range Status   SARS Coronavirus 2 by RT PCR NEGATIVE NEGATIVE Final    Comment: (NOTE) SARS-CoV-2 target nucleic acids are NOT DETECTED.  The SARS-CoV-2 RNA is generally detectable in upper respiratory specimens during the acute phase of infection. The lowest concentration of SARS-CoV-2 viral copies this assay can detect is 138 copies/mL. A negative result does not preclude  SARS-Cov-2 infection and should not be used as the sole basis for treatment or other patient management decisions. A negative result may occur with  improper specimen collection/handling, submission of specimen other than nasopharyngeal swab, presence of viral mutation(s) within the areas targeted by this assay, and inadequate number of viral copies(<138 copies/mL). A negative result must be combined with clinical observations, patient history, and epidemiological information. The expected result is Negative.  Fact Sheet for Patients:  BloggerCourse.com  Fact Sheet for Healthcare Providers:  SeriousBroker.it  This test is no t yet approved or cleared by the [place] FDA and  has been authorized for detection and/or diagnosis of SARS-CoV-2 by FDA under an Emergency Use Authorization (EUA). This EUA will remain  in effect (meaning this test can be used) for the duration of the COVID-19 declaration under Section 564(b)(1) of the Act, 21 U.S.C.section 360bbb-3(b)(1), unless the authorization is terminated  or revoked sooner.       Influenza A by PCR NEGATIVE NEGATIVE Final   Influenza B by PCR NEGATIVE NEGATIVE Final    Comment: (NOTE) The Xpert Xpress SARS-CoV-2/FLU/RSV plus assay is intended as an aid in the diagnosis of influenza from Nasopharyngeal swab specimens and should not be used as a sole basis for treatment. Nasal washings and aspirates are unacceptable for Xpert Xpress SARS-CoV-2/FLU/RSV testing.  Fact Sheet for Patients: BloggerCourse.com  Fact Sheet for Healthcare Providers: SeriousBroker.it  This test is not yet approved or cleared by the [place] FDA and has been authorized for detection and/or diagnosis of SARS-CoV-2 by FDA under an Emergency Use Authorization (EUA). This EUA will remain in effect (meaning this test can be used) for the duration of  the COVID-19 declaration under Section 564(b)(1) of the Act, 21 U.S.C. section 360bbb-3(b)(1), unless the authorization is terminated or revoked.     Resp Syncytial Virus by PCR NEGATIVE NEGATIVE Final    Comment: (NOTE) Fact Sheet for Patients: BloggerCourse.com  Fact Sheet for Healthcare Providers: SeriousBroker.it  This test is not yet approved or cleared by the [place] FDA and has been authorized for detection and/or diagnosis of SARS-CoV-2 by FDA under an Emergency Use Authorization (EUA). This EUA will remain in effect (meaning this test can be used) for the duration of the COVID-19 declaration under Section 564(b)(1) of  the Act, 21 U.S.C. section 360bbb-3(b)(1), unless the authorization is terminated or revoked.  Performed at [facility], [address], [place], [place] [address]   Respiratory (~20 pathogens) panel by PCR     Status: Abnormal   Collection Time: 11/19/23  1:38 AM   Specimen: Nasopharyngeal Swab; Respiratory  Result Value Ref Range Status   Adenovirus NOT DETECTED NOT DETECTED Final   Coronavirus 229E NOT DETECTED NOT DETECTED Final    Comment: (NOTE) The Coronavirus on the Respiratory Panel, DOES NOT test for the novel  Coronavirus (2019 nCoV)    Coronavirus HKU1 NOT DETECTED NOT DETECTED Final   Coronavirus NL63 NOT DETECTED NOT DETECTED Final   Coronavirus OC43 DETECTED (A) NOT DETECTED Final   Metapneumovirus NOT DETECTED NOT DETECTED Final   Rhinovirus / Enterovirus NOT DETECTED NOT DETECTED Final   Influenza A NOT DETECTED NOT DETECTED Final   Influenza B NOT DETECTED NOT DETECTED Final   Parainfluenza Virus 1 NOT DETECTED NOT DETECTED Final   Parainfluenza Virus 2 NOT DETECTED NOT DETECTED Final   Parainfluenza Virus 3 NOT DETECTED NOT DETECTED Final   Parainfluenza Virus 4 NOT DETECTED NOT DETECTED Final   Respiratory Syncytial Virus NOT DETECTED NOT DETECTED Final    Bordetella pertussis NOT DETECTED NOT DETECTED Final   Bordetella Parapertussis NOT DETECTED NOT DETECTED Final   Chlamydophila pneumoniae NOT DETECTED NOT DETECTED Final   Mycoplasma pneumoniae NOT DETECTED NOT DETECTED Final    Comment: Performed at [facility], [address], [place], [place] [address]  Urine Culture     Status: None   Collection Time: 11/19/23  2:37 AM   Specimen: Urine, Random  Result Value Ref Range Status   Specimen Description   Final    URINE, RANDOM Performed at [facility], [address], [place], [place] [address]    Special Requests   Final    NONE Reflexed from [id number] Performed at [facility], [address], [place], [place] [address]    Culture   Final    NO GROWTH Performed at [facility], [address], [place], [place] [address]    Report Status 11/20/2023 FINAL  Final         Radiology Studies: No results found.       Scheduled Meds:  amLODipine  5 mg Oral Daily   budesonide (PULMICORT) nebulizer solution  0.25 mg Nebulization BID   vitamin B-12  500 mcg Oral Daily   doxazosin  8 mg Oral Daily   doxycycline  100 mg Oral Q12H   escitalopram  20 mg Oral Daily   [START ON 11/23/2023] furosemide  40 mg Oral Daily   insulin aspart  0-9 Units Subcutaneous TID WC   ipratropium-albuterol  3 mL Nebulization TID   levothyroxine  75 mcg Oral Q0600   linagliptin  5 mg Oral Daily   lisinopril  40 mg Oral Daily   metoprolol succinate  100 mg Oral Daily   potassium chloride  40 mEq Oral Once   potassium chloride  40 mEq Oral Once   rosuvastatin  20 mg Oral QHS   sodium chloride flush  10-40 mL Intracatheter Q12H   Continuous Infusions:  cefTRIAXone (ROCEPHIN)  IV 1 g (11/21/23 1817)     LOS: 3 days    Time spent: 35 minutes    Tamitha Norell A Joeleen Wortley, MD [facility]   If 7PM-7AM, please contact night-coverage [email or web address]  11/22/2023, 4:00 PM

## 2023-11-22 NOTE — Progress Notes (Signed)
 Patient's overnight oxygen study started.

## 2023-11-22 NOTE — Plan of Care (Signed)

## 2023-11-23 DIAGNOSIS — R0902 Hypoxemia: Secondary | ICD-10-CM | POA: Diagnosis not present

## 2023-11-23 DIAGNOSIS — J449 Chronic obstructive pulmonary disease, unspecified: Secondary | ICD-10-CM

## 2023-11-23 DIAGNOSIS — I509 Heart failure, unspecified: Secondary | ICD-10-CM | POA: Diagnosis not present

## 2023-11-23 DIAGNOSIS — R531 Weakness: Secondary | ICD-10-CM

## 2023-11-23 DIAGNOSIS — R296 Repeated falls: Secondary | ICD-10-CM

## 2023-11-23 LAB — BASIC METABOLIC PANEL WITH GFR
Anion gap: 9 (ref 5–15)
BUN: 10 mg/dL (ref 8–23)
CO2: 25 mmol/L (ref 22–32)
Calcium: 8.5 mg/dL — ABNORMAL LOW (ref 8.9–10.3)
Chloride: 102 mmol/L (ref 98–111)
Creatinine, Ser: 0.52 mg/dL (ref 0.44–1.00)
GFR, Estimated: >60 mL/min (ref >60)
Glucose, Bld: 223 mg/dL — ABNORMAL HIGH (ref 70–99)
Potassium: 3.7 mmol/L (ref 3.5–5.1)
Sodium: 136 mmol/L (ref 135–145)

## 2023-11-23 LAB — GLUCOSE, CAPILLARY
Glucose-Capillary: 183 mg/dL — ABNORMAL HIGH (ref 70–99)
Glucose-Capillary: 185 mg/dL — ABNORMAL HIGH (ref 70–99)

## 2023-11-23 LAB — CULTURE, BLOOD (ROUTINE X 2)
Culture: NO GROWTH
Culture: NO GROWTH
Special Requests: ADEQUATE
Special Requests: ADEQUATE

## 2023-11-23 MED ORDER — FUROSEMIDE 40 MG PO TABS
40.0000 mg | ORAL_TABLET | Freq: Every day | ORAL | Status: DC
  Administered 2023-11-23: 40 mg via ORAL
  Filled 2023-11-23: qty 1

## 2023-11-23 MED ORDER — IPRATROPIUM-ALBUTEROL 0.5-2.5 (3) MG/3ML IN SOLN: 3.0000 mL | Freq: Four times a day (QID) | RESPIRATORY_TRACT | 0 refills | Status: DC

## 2023-11-23 MED ORDER — AMOXICILLIN-POT CLAVULANATE 875-125 MG PO TABS: 1.0000 | ORAL_TABLET | Freq: Two times a day (BID) | ORAL | 0 refills | Status: AC

## 2023-11-23 MED ORDER — POTASSIUM CHLORIDE CRYS ER 20 MEQ PO TBCR
40.0000 meq | EXTENDED_RELEASE_TABLET | Freq: Once | ORAL | Status: AC
  Administered 2023-11-23: 40 meq via ORAL
  Filled 2023-11-23: qty 2

## 2023-11-23 MED ORDER — SODIUM CHLORIDE 0.9 % IV SOLN
1.0000 g | INTRAVENOUS | Status: AC
  Administered 2023-11-23: 1 g via INTRAVENOUS
  Filled 2023-11-23: qty 10

## 2023-11-23 MED ORDER — CYANOCOBALAMIN 500 MCG PO TABS: 500.0000 ug | ORAL_TABLET | Freq: Every day | ORAL | 0 refills | Status: AC

## 2023-11-23 MED ORDER — GUAIFENESIN ER 600 MG PO TB12: 1200.0000 mg | ORAL_TABLET | Freq: Two times a day (BID) | ORAL | 0 refills | Status: DC

## 2023-11-23 MED ORDER — BUDESONIDE 0.25 MG/2ML IN SUSP: 0.2500 mg | Freq: Two times a day (BID) | RESPIRATORY_TRACT | 12 refills | Status: DC

## 2023-11-23 MED ORDER — GUAIFENESIN ER 600 MG PO TB12
1200.0000 mg | ORAL_TABLET | Freq: Two times a day (BID) | ORAL | Status: DC
  Administered 2023-11-23: 1200 mg via ORAL
  Filled 2023-11-23: qty 2

## 2023-11-23 MED ORDER — FUROSEMIDE 40 MG PO TABS: 40.0000 mg | ORAL_TABLET | Freq: Every day | ORAL | 0 refills | Status: DC

## 2023-11-23 NOTE — Plan of Care (Signed)

## 2023-11-23 NOTE — Progress Notes (Signed)
SATURATION QUALIFICATIONS: (This note is used to comply with regulatory documentation for home oxygen)  Patient Saturations on Room Air at Rest = 96%  Patient Saturations on Room Air while Ambulating = 90%  Patient Saturations on 2 Liters of oxygen while Ambulating = 98%  Please briefly explain why patient needs home oxygen:

## 2023-11-23 NOTE — Discharge Summary (Incomplete)
 Physician Discharge Summary   Patient: Kristi Willis MRN: [id number] DOB: [birth year]  Admit date:     11/18/2023  Discharge date: {dischdate:26783}  Discharge Physician: Alba Cory   PCP: Junie Spencer, FNP   Recommendations at discharge:  {Tip this will not be part of the note when signed- Example include specific recommendations for outpatient follow-up, pending tests to follow-up on. (Optional):26781}  ***  Discharge Diagnoses: Principal Problem:   Fever Active Problems:   Hyperlipidemia associated with type 2 diabetes mellitus (HCC)   Diabetes mellitus (HCC)   Essential hypertension   Speech articulation disorder   Hypothyroidism   Iron deficiency anemia   Primary progressive aphasia (HCC)   URI (upper respiratory infection)   UTI (urinary tract infection)   Weakness   Hypoxia  Resolved Problems:   * No resolved hospital problems. [facility] Course: No notes on file  Assessment and Plan: No notes have been filed under this hospital service. Service: Hospitalist     {Tip this will not be part of the note when signed Body mass index is 28.31 kg/m. , ,  (Optional):26781}  {(NOTE) Pain control PDMP Statment (Optional):26782} Consultants: *** Procedures performed: ***  Disposition: {Plan; Disposition:26390} Diet recommendation:  Discharge Diet Orders (From admission, onward)     Start     Ordered   11/23/23 0000  Diet - low sodium heart healthy        11/23/23 1214           {Diet_Plan:26776} DISCHARGE MEDICATION: Allergies as of 11/23/2023       Reactions   Dimetapp C [phenylephrine-bromphen-codeine] Itching   Phenylephrine Hcl Itching   Shingrix [zoster Vac Recomb Adjuvanted] Other (See Comments)   Swelling and fever    Ultram [tramadol Hcl] Other (See Comments)   Insomnia   Celebrex [celecoxib] Rash   Cortisone Rash   Fenofibrate Rash   Sulfa Antibiotics Rash        Medication List     STOP taking these medications     doxycycline 100 MG tablet Commonly known as: VIBRA-TABS   loratadine 10 MG tablet Commonly known as: CLARITIN   predniSONE 10 MG (21) Tbpk tablet Commonly known as: STERAPRED UNI-PAK 21 TAB       TAKE these medications    Accu-Chek Aviva Plus test strip Generic drug: glucose blood CHECK BLOOD SUGAR ONCE DAILY OR AS DIRECTED Dx E11.40   Accu-Chek Softclix Lancets lancets CHECK BLOOD SUGAR ONCE DAILY OR AS DIRECTED Dx E11.40   acyclovir ointment 5 % Commonly known as: ZOVIRAX APPLY TO THE AFFECTED AREA AROUND MOUTH EVERY 3 HOURS AS DIRECTED UNTIL CLEAR   albuterol 108 (90 Base) MCG/ACT inhaler Commonly known as: VENTOLIN HFA INHALE TWO PUFFS EVERY 6 HOURS AS NEEDED FOR WHEEZING OR SHORTNESS OF BREATH What changed: Another medication with the same name was removed. Continue taking this medication, and follow the directions you see here.   amLODipine 5 MG tablet Commonly known as: NORVASC TAKE ONE TABLET BY MOUTH DAILY   amoxicillin-clavulanate 875-125 MG tablet Commonly known as: AUGMENTIN Take 1 tablet by mouth 2 (two) times daily for 3 days.   aspirin 81 MG chewable tablet Chew 81 mg by mouth at bedtime.   azelastine 0.1 % nasal spray Commonly known as: ASTELIN Place 2 sprays into both nostrils 2 (two) times daily.   benzonatate 200 MG capsule Commonly known as: TESSALON Take 1 capsule (200 mg total) by mouth 3 (three) times daily as needed.  budesonide 0.25 MG/2ML nebulizer solution Commonly known as: PULMICORT Take 2 mLs (0.25 mg total) by nebulization 2 (two) times daily.   cetirizine 10 MG tablet Commonly known as: ZYRTEC TAKE ONE TABLET DAILY   COQ10 PO Take 1 tablet by mouth daily.   cyanocobalamin 500 MCG tablet Commonly known as: VITAMIN B12 Take 1 tablet (500 mcg total) by mouth daily. Start taking on: November 24, 2023   doxazosin 8 MG tablet Commonly known as: CARDURA TAKE (1) TABLET DAILY FOR HIGH BLOOD PRESSURE.   EpiPen 2-Pak 0.3  MG/0.3ML Soaj injection Generic drug: EPINEPHrine Inject 0.3 mg into the muscle as needed for anaphylaxis. Reported on 01/24/2016   escitalopram 20 MG tablet Commonly known as: LEXAPRO TAKE ONE TABLET BY MOUTH DAILY   fluticasone 50 MCG/ACT nasal spray Commonly known as: FLONASE Place 1 spray into both nostrils daily.   furosemide 40 MG tablet Commonly known as: LASIX Take 1 tablet (40 mg total) by mouth daily for 7 days.   guaiFENesin 600 MG 12 hr tablet Commonly known as: MUCINEX Take 2 tablets (1,200 mg total) by mouth 2 (two) times daily.   ipratropium-albuterol 0.5-2.5 (3) MG/3ML Soln Commonly known as: DUONEB Take 3 mLs by nebulization every 6 (six) hours. What changed: when to take this   Januvia 50 MG tablet Generic drug: sitaGLIPtin TAKE ONE TABLET BY MOUTH DAILY   ketoconazole 2 % cream Commonly known as: NIZORAL Apply 1 application topically daily.   levothyroxine 75 MCG tablet Commonly known as: SYNTHROID TAKE ONE TABLET DAILY BEFORE BREAKFAST   lisinopril 40 MG tablet Commonly known as: ZESTRIL TAKE ONE TABLET DAILY   metFORMIN 1000 MG tablet Commonly known as: GLUCOPHAGE TAKE  (1)  TABLET TWICE A DAY WITH MEALS (BREAKFAST AND SUPPER)   metoprolol succinate 100 MG 24 hr tablet Commonly known as: TOPROL-XL TAKE ONE TABLET DAILY   montelukast 10 MG tablet Commonly known as: SINGULAIR TAKE ONE TABLET AT BEDTIME   rosuvastatin 20 MG tablet Commonly known as: Crestor Take 1 tablet (20 mg total) by mouth at bedtime.               Durable Medical Equipment  (From admission, onward)           Start     Ordered   11/23/23 1152  For home use only DME oxygen  Once       Question Answer Comment  Length of Need Lifetime   Mode or (Route) Nasal cannula   Liters per Minute 2   Frequency Only at night (stationary unit needed)   Oxygen delivery system Gas      11/23/23 1151            Discharge Exam: Filed Weights   11/19/23 0106   Weight: 74.8 kg   ***  Condition at discharge: {DC Condition:26389}  The results of significant diagnostics from this hospitalization (including imaging, microbiology, ancillary and laboratory) are listed below for reference.   Imaging Studies: DG CHEST PORT 1 VIEW Result Date: 11/20/2023 CLINICAL DATA:  Hypoxia. EXAM: PORTABLE CHEST 1 VIEW COMPARISON:  November 18, 2023. FINDINGS: Mild cardiomegaly is noted. Minimal bibasilar subsegmental atelectasis is noted. Bony thorax is unremarkable. IMPRESSION: Minimal bibasilar subsegmental atelectasis. Electronically Signed   By: Lupita Raider M.D.   On: 11/20/2023 11:14   ECHOCARDIOGRAM COMPLETE Result Date: 11/19/2023    ECHOCARDIOGRAM REPORT   Patient Name:   JAMIEE MILHOLLAND Date of Exam: 11/19/2023 Medical Rec #:  [id number]  Height:       64.0 in Accession #:    [id number]          Weight:       164.9 lb Date of Birth:  02/11/1941           BSA:          1.802 m Patient Age:    82 years            BP:           168/63 mmHg Patient Gender: F                   HR:           40 bpm. Exam Location:  Inpatient Procedure: 2D Echo, Cardiac Doppler and Color Doppler (Both Spectral and Color            Flow Doppler were utilized during procedure). Indications:    Abnormal EKG  History:        Patient has prior history of Echocardiogram examinations, most                 recent 02/11/2021. Risk Factors:Hypertension.  Sonographer:    Amy Chionchio Referring Phys: [id number] Corney Knighton A Lilianna Case IMPRESSIONS  1. Left ventricular ejection fraction, by estimation, is 60 to 65%. The left ventricle has normal function. The left ventricle has no regional wall motion abnormalities. There is mild left ventricular hypertrophy. Left ventricular diastolic parameters are consistent with Grade III diastolic dysfunction (restrictive). Elevated left atrial pressure.  2. Right ventricular systolic function is normal. The right ventricular size is normal. Tricuspid regurgitation signal  is inadequate for assessing PA pressure.  3. Left atrial size was mildly dilated.  4. A small pericardial effusion is present.  5. The mitral valve is degenerative. Trivial mitral valve regurgitation. No evidence of mitral stenosis. Moderate mitral annular calcification.  6. The aortic valve is tricuspid. Aortic valve regurgitation is not visualized. No aortic stenosis is present. FINDINGS  Left Ventricle: Left ventricular ejection fraction, by estimation, is 60 to 65%. The left ventricle has normal function. The left ventricle has no regional wall motion abnormalities. The left ventricular internal cavity size was normal in size. There is  mild left ventricular hypertrophy. Left ventricular diastolic parameters are consistent with Grade III diastolic dysfunction (restrictive). Elevated left atrial pressure. Right Ventricle: The right ventricular size is normal. No increase in right ventricular wall thickness. Right ventricular systolic function is normal. Tricuspid regurgitation signal is inadequate for assessing PA pressure. Left Atrium: Left atrial size was mildly dilated. Right Atrium: Right atrial size was normal in size. Pericardium: A small pericardial effusion is present. Mitral Valve: The mitral valve is degenerative in appearance. Moderate mitral annular calcification. Trivial mitral valve regurgitation. No evidence of mitral valve stenosis. MV peak gradient, 7.6 mmHg. The mean mitral valve gradient is 2.0 mmHg. Tricuspid Valve: The tricuspid valve is normal in structure. Tricuspid valve regurgitation is trivial. Aortic Valve: The aortic valve is tricuspid. Aortic valve regurgitation is not visualized. No aortic stenosis is present. Aortic valve mean gradient measures 5.0 mmHg. Aortic valve peak gradient measures 8.9 mmHg. Aortic valve area, by VTI measures 3.03 cm. Pulmonic Valve: The pulmonic valve was not well visualized. Pulmonic valve regurgitation is not visualized. Aorta: The aortic root and  ascending aorta are structurally normal, with no evidence of dilitation. IAS/Shunts: The interatrial septum was not well visualized.  LEFT VENTRICLE PLAX 2D LVIDd:  5.20 cm   Diastology LVIDs:         3.20 cm   LV e' medial:    6.85 cm/s LV PW:         1.10 cm   LV E/e' medial:  18.7 LV IVS:        0.70 cm   LV e' lateral:   4.68 cm/s LVOT diam:     2.00 cm   LV E/e' lateral: 27.4 LV SV:         98 LV SV Index:   54 LVOT Area:     3.14 cm  RIGHT VENTRICLE         IVC TAPSE (M-mode): 1.6 cm  IVC diam: 2.40 cm LEFT ATRIUM             Index        RIGHT ATRIUM           Index LA Vol (A2C):   59.3 ml 32.90 ml/m  RA Area:     17.20 cm LA Vol (A4C):   56.4 ml 31.29 ml/m  RA Volume:   43.20 ml  23.97 ml/m LA Biplane Vol: 62.9 ml 34.90 ml/m  AORTIC VALVE AV Area (Vmax):    2.68 cm AV Area (Vmean):   2.68 cm AV Area (VTI):     3.03 cm AV Vmax:           149.00 cm/s AV Vmean:          100.000 cm/s AV VTI:            0.324 m AV Peak Grad:      8.9 mmHg AV Mean Grad:      5.0 mmHg LVOT Vmax:         127.00 cm/s LVOT Vmean:        85.300 cm/s LVOT VTI:          0.312 m LVOT/AV VTI ratio: 0.96  AORTA Ao Root diam: 2.40 cm Ao Asc diam:  3.00 cm MITRAL VALVE MV Area (PHT): 4.31 cm     SHUNTS MV Area VTI:   2.62 cm     Systemic VTI:  0.31 m MV Peak grad:  7.6 mmHg     Systemic Diam: 2.00 cm MV Mean grad:  2.0 mmHg MV Vmax:       1.38 m/s MV Vmean:      62.4 cm/s MV Decel Time: 176 msec MV E velocity: 128.00 cm/s MV A velocity: 49.80 cm/s MV E/A ratio:  2.57 Epifanio Lesches MD Electronically signed by Epifanio Lesches MD Signature Date/Time: 11/19/2023/6:35:52 PM    Final    CT Angio Chest PE W and/or Wo Contrast Result Date: 11/18/2023 CLINICAL DATA:  Pulmonary embolism (PE) suspected, high prob fever, cough. EXAM: CT ANGIOGRAPHY CHEST WITH CONTRAST TECHNIQUE: Multidetector CT imaging of the chest was performed using the standard protocol during bolus administration of intravenous contrast. Multiplanar CT  image reconstructions and MIPs were obtained to evaluate the vascular anatomy. RADIATION DOSE REDUCTION: This exam was performed according to the departmental dose-optimization program which includes automated exposure control, adjustment of the mA and/or kV according to patient size and/or use of iterative reconstruction technique. CONTRAST:  OMNIPAQUE IOHEXOL 350 MG/ML SOLN COMPARISON:  07/10/2022 FINDINGS: Cardiovascular: No filling defects in the pulmonary arteries to suggest pulmonary emboli. Heart mildly enlarged. Aorta normal caliber. Scattered coronary artery and aortic atherosclerosis. Mediastinum/Nodes: Right paratracheal lymph node has a short axis diameter of 1.6 cm compared to 1.0  cm previously. No axillary or hilar adenopathy. Trachea and esophagus are unremarkable. Thyroid unremarkable. Lungs/Pleura: Small right pleural effusion. Dependent atelectasis in the lower lobes. No confluent opacities. Upper Abdomen: No acute findings Musculoskeletal: Chest wall soft tissues are unremarkable. No acute bony abnormality. Review of the MIP images confirms the above findings. IMPRESSION: No evidence of pulmonary embolus. Coronary artery disease. Mediastinal adenopathy with mildly enlarged right paratracheal lymph node, slightly larger than prior study. This could be reactive. This could be followed with repeat CT in 6 months to ensure stability. Small right pleural effusion. Aortic Atherosclerosis (ICD10-I70.0). Electronically Signed   By: Charlett Nose M.D.   On: 11/18/2023 20:23   DG Chest 2 View Result Date: 11/18/2023 CLINICAL DATA:  Suspected sepsis.  Fever.  Weakness. EXAM: CHEST - 2 VIEW COMPARISON:  07/15/2023. FINDINGS: Low lung volume. Bilateral lung fields are clear. Bilateral costophrenic angles are clear. Stable cardio-mediastinal silhouette. No acute osseous abnormalities. The soft tissues are within normal limits. IMPRESSION: No active cardiopulmonary disease. Electronically Signed   By:  Jules Schick M.D.   On: 11/18/2023 18:22    Microbiology: Results for orders placed or performed during the hospital encounter of 11/18/23  Culture, blood (Routine x 2)     Status: None   Collection Time: 11/18/23  4:54 PM   Specimen: BLOOD  Result Value Ref Range Status   Specimen Description   Final    BLOOD LEFT ANTECUBITAL Performed at [facility], [address] [place], [place] [address]    Special Requests   Final    Blood Culture adequate volume BOTTLES DRAWN AEROBIC AND ANAEROBIC Performed at [facility], [address] [place], [place] [address]    Culture   Final    NO GROWTH 5 DAYS Performed at [facility], [address], [place], [place] [address]    Report Status 11/23/2023 FINAL  Final  Culture, blood (Routine x 2)     Status: None   Collection Time: 11/18/23  4:59 PM   Specimen: BLOOD RIGHT FOREARM  Result Value Ref Range Status   Specimen Description   Final    BLOOD RIGHT FOREARM Performed at [facility], [address], [place], [place] [address]    Special Requests   Final    Blood Culture adequate volume BOTTLES DRAWN AEROBIC AND ANAEROBIC Performed at [facility], [address] [place], [place] [address]    Culture   Final    NO GROWTH 5 DAYS Performed at [facility], [address], [place], [place] [address]    Report Status 11/23/2023 FINAL  Final  Resp panel by RT-PCR (RSV, Flu A&B, Covid) Anterior Nasal Swab     Status: None   Collection Time: 11/18/23  5:09 PM   Specimen: Anterior Nasal Swab  Result Value Ref Range Status   SARS Coronavirus 2 by RT PCR NEGATIVE NEGATIVE Final    Comment: (NOTE) SARS-CoV-2 target nucleic acids are NOT DETECTED.  The SARS-CoV-2 RNA is generally detectable in upper respiratory specimens during the acute phase of infection. The lowest concentration of SARS-CoV-2 viral copies this assay can detect is 138 copies/mL. A  negative result does not preclude SARS-Cov-2 infection and should not be used as the sole basis for treatment or other patient management decisions. A negative result may occur with  improper specimen collection/handling, submission of specimen other than nasopharyngeal swab, presence of viral mutation(s) within the areas targeted by this assay, and inadequate  number of viral copies(<138 copies/mL). A negative result must be combined with clinical observations, patient history, and epidemiological information. The expected result is Negative.  Fact Sheet for Patients:  BloggerCourse.com  Fact Sheet for Healthcare Providers:  SeriousBroker.it  This test is no t yet approved or cleared by the [place] FDA and  has been authorized for detection and/or diagnosis of SARS-CoV-2 by FDA under an Emergency Use Authorization (EUA). This EUA will remain  in effect (meaning this test can be used) for the duration of the COVID-19 declaration under Section 564(b)(1) of the Act, 21 U.S.C.section 360bbb-3(b)(1), unless the authorization is terminated  or revoked sooner.       Influenza A by PCR NEGATIVE NEGATIVE Final   Influenza B by PCR NEGATIVE NEGATIVE Final    Comment: (NOTE) The Xpert Xpress SARS-CoV-2/FLU/RSV plus assay is intended as an aid in the diagnosis of influenza from Nasopharyngeal swab specimens and should not be used as a sole basis for treatment. Nasal washings and aspirates are unacceptable for Xpert Xpress SARS-CoV-2/FLU/RSV testing.  Fact Sheet for Patients: BloggerCourse.com  Fact Sheet for Healthcare Providers: SeriousBroker.it  This test is not yet approved or cleared by the [place] FDA and has been authorized for detection and/or diagnosis of SARS-CoV-2 by FDA under an Emergency Use Authorization (EUA). This EUA will remain in effect (meaning this test can  be used) for the duration of the COVID-19 declaration under Section 564(b)(1) of the Act, 21 U.S.C. section 360bbb-3(b)(1), unless the authorization is terminated or revoked.     Resp Syncytial Virus by PCR NEGATIVE NEGATIVE Final    Comment: (NOTE) Fact Sheet for Patients: BloggerCourse.com  Fact Sheet for Healthcare Providers: SeriousBroker.it  This test is not yet approved or cleared by the [place] FDA and has been authorized for detection and/or diagnosis of SARS-CoV-2 by FDA under an Emergency Use Authorization (EUA). This EUA will remain in effect (meaning this test can be used) for the duration of the COVID-19 declaration under Section 564(b)(1) of the Act, 21 U.S.C. section 360bbb-3(b)(1), unless the authorization is terminated or revoked.  Performed at [facility], [address] [place], [place] [address]   Respiratory (~20 pathogens) panel by PCR     Status: Abnormal   Collection Time: 11/19/23  1:38 AM   Specimen: Nasopharyngeal Swab; Respiratory  Result Value Ref Range Status   Adenovirus NOT DETECTED NOT DETECTED Final   Coronavirus 229E NOT DETECTED NOT DETECTED Final    Comment: (NOTE) The Coronavirus on the Respiratory Panel, DOES NOT test for the novel  Coronavirus (2019 nCoV)    Coronavirus HKU1 NOT DETECTED NOT DETECTED Final   Coronavirus NL63 NOT DETECTED NOT DETECTED Final   Coronavirus OC43 DETECTED (A) NOT DETECTED Final   Metapneumovirus NOT DETECTED NOT DETECTED Final   Rhinovirus / Enterovirus NOT DETECTED NOT DETECTED Final   Influenza A NOT DETECTED NOT DETECTED Final   Influenza B NOT DETECTED NOT DETECTED Final   Parainfluenza Virus 1 NOT DETECTED NOT DETECTED Final   Parainfluenza Virus 2 NOT DETECTED NOT DETECTED Final   Parainfluenza Virus 3 NOT DETECTED NOT DETECTED Final   Parainfluenza Virus 4 NOT DETECTED NOT DETECTED Final   Respiratory Syncytial Virus NOT  DETECTED NOT DETECTED Final   Bordetella pertussis NOT DETECTED NOT DETECTED Final   Bordetella Parapertussis NOT DETECTED NOT DETECTED Final   Chlamydophila pneumoniae NOT DETECTED NOT DETECTED Final   Mycoplasma pneumoniae NOT DETECTED NOT DETECTED Final    Comment: Performed  at [facility], [address] [place], [place] [address]  Urine Culture     Status: None   Collection Time: 11/19/23  2:37 AM   Specimen: Urine, Random  Result Value Ref Range Status   Specimen Description   Final    URINE, RANDOM Performed at [facility], [address], [place], [place] [address]    Special Requests   Final    NONE Reflexed from [id number] Performed at [facility], [address] [place], [place] [address]    Culture   Final    NO GROWTH Performed at [facility], [address], [place], [place] [address]    Report Status 11/20/2023 FINAL  Final    Labs: CBC: Recent Labs  Lab 11/18/23 1709 11/19/23 0600 11/20/23 0516 11/21/23 0321 11/21/23 2014 11/22/23 1355  WBC 13.9* 11.1* 8.0 6.0  --  6.9  NEUTROABS 12.1* 7.9*  --   --   --   --   HGB 8.8* 9.2* 7.6* 7.4* 9.7* 9.2*  HCT 27.5* 30.1* 25.2* 24.2* 30.9* 29.8*  MCV 81.4 84.3 86.0 84.6  --  84.2  PLT 219 232 184 173  --  209   Basic Metabolic Panel: Recent Labs  Lab 11/18/23 1709 11/19/23 0600 11/20/23 0516 11/22/23 1355 11/23/23 1022  NA 137 136 135 131* 136  K 3.9 3.6 3.5 3.1* 3.7  CL 101 102 103 96* 102  CO2 24 23 21* 23 25  GLUCOSE 178* 189* 197* 222* 223*  BUN 13 10 12 10 10   CREATININE 0.56 0.58 0.65 0.51 0.52  CALCIUM 8.9 8.8* 8.1* 8.3* 8.5*   Liver Function Tests: Recent Labs  Lab 11/18/23 1709 11/19/23 0600  AST 12* 21  ALT 10 13  ALKPHOS 74 74  BILITOT 0.8 0.9  PROT 6.7 7.0  ALBUMIN 4.1 3.8   CBG: Recent Labs  Lab 11/22/23 1118 11/22/23 1710 11/22/23 2100 11/23/23 0743 11/23/23 1223  GLUCAP 233* 207* 173* 183* 185*    Discharge time  spent: {LESS THAN/GREATER THAN:26388} 30 minutes.  Signed: Alba Cory, MD [facility] 11/23/2023

## 2023-11-23 NOTE — Progress Notes (Signed)
 Overnight pulse ox study ended. Patient was awake on and off all night.

## 2023-11-23 NOTE — TOC Transition Note (Addendum)
 Transition of Care [facility]) - Discharge Note   Patient Details  Name: Kristi Willis MRN: [id number] Date of Birth: March 28, 1941  Transition of Care [facility]) CM/SW Contact:  Adrian Prows, RN Phone Number: 11/23/2023, 2:23 PM   Clinical Narrative:    Orders received for nocturnal oxygen; spoke w/ pt and dtr Kristi Willis [phone]); her dtr verified D/C address [address], [place] [address]; her dtr agrees to pt recc service; she does not have an agency preference; spoke w/ Vaughan Basta at [facility]; he says agency can provide service; he requested nocturnal oximetry study be emailed to [email or web address]; pt's dtr given name of agency; agency contact info also placed in follow up provider section of d/c instructions; no TOC needs.  -1510- face sheet and nocturnal oximetry sent via secure email to [facility]  Final next level of care: Home/Self Care Barriers to Discharge: No Barriers Identified   Patient Goals and CMS Choice            Discharge Placement                       Discharge Plan and Services Additional resources added to the After Visit Summary for                  DME Arranged: Oxygen DME Agency: [employer] Date DME Agency Contacted: 11/23/23 Time DME Agency Contacted: 1423 Representative spoke with at DME Agency: Vaughan Basta HH Arranged: NA HH Agency: NA        Social Drivers of Health (SDOH) Interventions SDOH Screenings   Food Insecurity: No Food Insecurity (11/19/2023)  Housing: Low Risk  (11/19/2023)  Transportation Needs: No Transportation Needs (11/19/2023)  Utilities: Not At Risk (11/19/2023)  Alcohol Screen: Low Risk  (03/10/2023)  Depression (PHQ2-9): Low Risk  (06/12/2023)  Financial Resource Strain: Low Risk  (03/10/2023)  Physical Activity: Inactive (03/10/2023)  Social Connections: Socially Isolated (11/19/2023)  Stress: No Stress Concern Present (03/10/2023)  Tobacco Use: Low Risk  (11/19/2023)  Health Literacy:  Adequate Health Literacy (03/10/2023)     Readmission Risk Interventions    11/19/2023    1:22 PM  Readmission Risk Prevention Plan  Transportation Screening Complete  PCP or Specialist Appt within 5-7 Days Complete  Home Care Screening Complete  Medication Review (RN CM) Complete

## 2023-11-23 NOTE — Plan of Care (Signed)

## 2023-11-23 NOTE — Progress Notes (Signed)
 Mobility Specialist - Progress Note   11/23/23 0931  Oxygen Therapy  SpO2 93 %  O2 Device Nasal Cannula  O2 Flow Rate (L/min) 2 L/min  Patient Activity (if Appropriate) Ambulating  Mobility  Activity Ambulated independently in hallway  Level of Assistance Independent  Assistive Device None  Distance Ambulated (ft) 500 ft  Activity Response Tolerated well  Mobility Referral Yes  Mobility visit 1 Mobility  Mobility Specialist Start Time (ACUTE ONLY) 0910  Mobility Specialist Stop Time (ACUTE ONLY) 0931  Mobility Specialist Time Calculation (min) (ACUTE ONLY) 21 min   Pt received EOB and agreeable to mobility. Prior to ambulating, pt requested assistance to the bathroom. No complaints during session. Pt to recliner after session with all needs met.   Pre-mobility: 93% SpO2 (2L [place]) During mobility: 93% SpO2 (2L [place]) Post-mobility: 93% SPO2 (2L [place])  Chief Technology Officer

## 2023-11-23 NOTE — Discharge Summary (Addendum)
 Physician Discharge Summary   Patient: Kristi Willis MRN: [id number] DOB: 04/22/1941  Admit date:     11/18/2023  Discharge date: 11/23/23  Discharge Physician: Danette Duos   PCP: Yevette Hem, FNP   Recommendations at discharge:   Monitor renal function, volume status to determine continuation of lasix . Patient was started on lasix .  Follow up with cardiology for diastolic HF.  Follow up resolution of bronchitis.   Discharge Diagnoses: Principal Problem:   Fever Active Problems:   Hyperlipidemia associated with type 2 diabetes mellitus (HCC)   Diabetes mellitus (HCC)   Essential hypertension   Speech articulation disorder   Hypothyroidism   Iron deficiency anemia   Primary progressive aphasia (HCC)   URI (upper respiratory infection)   UTI (urinary tract infection)   Weakness   Hypoxia  Resolved Problems:   * No resolved hospital problems. *  Hospital Course: 83 year old with past medical history significant for primary progressive aphasia, diabetes type 2, hypertension, hypothyroidism presented to the ED accompanied by daughter due to new development of fever and progressive generalized weakness. Patient had gone for her dentist appointment and following which patient was found to have fever. Patient has been having nonproductive cough over the last couple of days. Evaluation in the ED she had a fever 101, COVID and flu negative, coronavirus 43 positive. UA with finding concerning for UTI.    Assessment and Plan: 1-Fever, generalized weakness: In the setting of UTI and coronavirus 43 not COVID-19 -Supportive care -Treated  with IV antibiotics -Urine culture: no growth to date   2-UTI; Presents with Fever, UA with 21-50 WBC.  Urine culture no growth to date Treated  with IV ceftriaxone .    3-Hypertension: Continue amlodipine , lisinopril , Toprol  and Cardura  Started on lasix .    Diabetes Type II: Takes Januvia  and metformin  at home hold for now.   Continue with sliding scale insulin  Resume home regimen at discharge.   Acute Hypoxia; Small right pleural effusion:  Acute Bronchitis secondary to coronavirus 43.   Stop IV fluids Acute on chronic diastolic HF exacerbation.  Oxygen 90 % RA. Per nurse oxygen drop to high 80 overnight, currently on 2 L.  -Stop fluids.  ECHO; showed diastolic Dysfunction grade 3.  Chest x ray cardiomegaly, atelectasis.  Started  low dose lasix .  Schedule nebulizer/  Discharge on Augmentin .   Hyperlipidemia: Continue with the statins   Hypothyroidism: Continue with Synthroid    Anemia iron deficiency and low normal B12: Started B12 supplementation and iron supplementation Hb down to 7.4. Suspect anemia of acute illness as well.  Occult blood negative.  Received one unit PRBC 4/04.   Primary progressive aphasia: Continue with supportive care Mediastinal adenopathy mildly enlarged right paratracheal lymph node.  Slightly larger than previous studies.  She will need follow-up CT chest 6 months Hypokalemia; Replaced.  Hypoxia at Night; Home oxygen arrange.    Estimated body mass index is 28.31 kg/m as calculated from the following:   Height as of this encounter: 5\' 4"  (1.626 m).   Weight as of this encounter: 74.8 kg.         Consultants: None Procedures performed: ECHO Disposition: Home Diet recommendation:  Discharge Diet Orders (From admission, onward)     Start     Ordered   11/23/23 0000  Diet - low sodium heart healthy        11/23/23 1214           Cardiac diet DISCHARGE MEDICATION: Allergies as of 11/23/2023  Reactions   Dimetapp C [phenylephrine-bromphen-codeine] Itching   Phenylephrine Hcl Itching   Shingrix [zoster Vac Recomb Adjuvanted] Other (See Comments)   Swelling and fever    Ultram [tramadol Hcl] Other (See Comments)   Insomnia   Celebrex [celecoxib] Rash   Cortisone Rash   Fenofibrate Rash   Sulfa Antibiotics Rash        Medication List      STOP taking these medications    doxycycline  100 MG tablet Commonly known as: VIBRA -TABS   loratadine  10 MG tablet Commonly known as: CLARITIN    predniSONE  10 MG (21) Tbpk tablet Commonly known as: STERAPRED UNI-PAK 21 TAB       TAKE these medications    Accu-Chek Aviva Plus test strip Generic drug: glucose blood CHECK BLOOD SUGAR ONCE DAILY OR AS DIRECTED Dx E11.40   Accu-Chek Softclix Lancets lancets CHECK BLOOD SUGAR ONCE DAILY OR AS DIRECTED Dx E11.40   acyclovir  ointment 5 % Commonly known as: ZOVIRAX  APPLY TO THE AFFECTED AREA AROUND MOUTH EVERY 3 HOURS AS DIRECTED UNTIL CLEAR   albuterol  108 (90 Base) MCG/ACT inhaler Commonly known as: VENTOLIN  HFA INHALE TWO PUFFS EVERY 6 HOURS AS NEEDED FOR WHEEZING OR SHORTNESS OF BREATH What changed: Another medication with the same name was removed. Continue taking this medication, and follow the directions you see here.   amLODipine  5 MG tablet Commonly known as: NORVASC  TAKE ONE TABLET BY MOUTH DAILY   amoxicillin -clavulanate 875-125 MG tablet Commonly known as: AUGMENTIN  Take 1 tablet by mouth 2 (two) times daily for 3 days.   aspirin  81 MG chewable tablet Chew 81 mg by mouth at bedtime.   azelastine  0.1 % nasal spray Commonly known as: ASTELIN  Place 2 sprays into both nostrils 2 (two) times daily.   benzonatate  200 MG capsule Commonly known as: TESSALON  Take 1 capsule (200 mg total) by mouth 3 (three) times daily as needed.   budesonide  0.25 MG/2ML nebulizer solution Commonly known as: PULMICORT  Take 2 mLs (0.25 mg total) by nebulization 2 (two) times daily.   cetirizine  10 MG tablet Commonly known as: ZYRTEC  TAKE ONE TABLET DAILY   COQ10 PO Take 1 tablet by mouth daily.   cyanocobalamin  500 MCG tablet Commonly known as: VITAMIN B12 Take 1 tablet (500 mcg total) by mouth daily. Start taking on: November 24, 2023   doxazosin  8 MG tablet Commonly known as: CARDURA  TAKE (1) TABLET DAILY FOR HIGH BLOOD  PRESSURE.   EpiPen 2-Pak 0.3 MG/0.3ML Soaj injection Generic drug: EPINEPHrine Inject 0.3 mg into the muscle as needed for anaphylaxis. Reported on 01/24/2016   escitalopram  20 MG tablet Commonly known as: LEXAPRO  TAKE ONE TABLET BY MOUTH DAILY   fluticasone 50 MCG/ACT nasal spray Commonly known as: FLONASE Place 1 spray into both nostrils daily.   furosemide  40 MG tablet Commonly known as: LASIX  Take 1 tablet (40 mg total) by mouth daily for 7 days.   guaiFENesin  600 MG 12 hr tablet Commonly known as: MUCINEX  Take 2 tablets (1,200 mg total) by mouth 2 (two) times daily.   ipratropium-albuterol  0.5-2.5 (3) MG/3ML Soln Commonly known as: DUONEB Take 3 mLs by nebulization every 6 (six) hours. What changed: when to take this   Januvia  50 MG tablet Generic drug: sitaGLIPtin  TAKE ONE TABLET BY MOUTH DAILY   ketoconazole  2 % cream Commonly known as: NIZORAL  Apply 1 application topically daily.   levothyroxine  75 MCG tablet Commonly known as: SYNTHROID  TAKE ONE TABLET DAILY BEFORE BREAKFAST   lisinopril  40 MG  tablet Commonly known as: ZESTRIL  TAKE ONE TABLET DAILY   metFORMIN  1000 MG tablet Commonly known as: GLUCOPHAGE  TAKE  (1)  TABLET TWICE A DAY WITH MEALS (BREAKFAST AND SUPPER)   metoprolol  succinate 100 MG 24 hr tablet Commonly known as: TOPROL -XL TAKE ONE TABLET DAILY   montelukast  10 MG tablet Commonly known as: SINGULAIR  TAKE ONE TABLET AT BEDTIME   rosuvastatin  20 MG tablet Commonly known as: Crestor  Take 1 tablet (20 mg total) by mouth at bedtime.               Durable Medical Equipment  (From admission, onward)           Start     Ordered   11/23/23 1152  For home use only DME oxygen  Once       Question Answer Comment  Length of Need Lifetime   Mode or (Route) Nasal cannula   Liters per Minute 2   Frequency Only at night (stationary unit needed)   Oxygen delivery system Gas      11/23/23 1151            Follow-up  Information     [facility] Follow up.   Contact information: [facility])  [address] [place], [place] [address] [phone]               Discharge Exam: Cleavon Curls Weights   11/19/23 0106  Weight: 74.8 kg   General; NAD  Condition at discharge: stable  The results of significant diagnostics from this hospitalization (including imaging, microbiology, ancillary and laboratory) are listed below for reference.   Imaging Studies: DG CHEST PORT 1 VIEW Result Date: 11/20/2023 CLINICAL DATA:  Hypoxia. EXAM: PORTABLE CHEST 1 VIEW COMPARISON:  November 18, 2023. FINDINGS: Mild cardiomegaly is noted. Minimal bibasilar subsegmental atelectasis is noted. Bony thorax is unremarkable. IMPRESSION: Minimal bibasilar subsegmental atelectasis. Electronically Signed   By: Rosalene Colon M.D.   On: 11/20/2023 11:14   ECHOCARDIOGRAM COMPLETE Result Date: 11/19/2023    ECHOCARDIOGRAM REPORT   Patient Name:   BREIANNA GUZZO Date of Exam: 11/19/2023 Medical Rec #:  [id number]           Height:       64.0 in Accession #:    [id number]          Weight:       164.9 lb Date of Birth:  [birth year]           BSA:          1.802 m Patient Age:    82 years            BP:           168/63 mmHg Patient Gender: F                   HR:           40 bpm. Exam Location:  Inpatient Procedure: 2D Echo, Cardiac Doppler and Color Doppler (Both Spectral and Color            Flow Doppler were utilized during procedure). Indications:    Abnormal EKG  History:        Patient has prior history of Echocardiogram examinations, most                 recent 02/11/2021. Risk Factors:Hypertension.  Sonographer:    Amy Chionchio Referring Phys: [id number] Lynden Flemmer A Stella Encarnacion IMPRESSIONS  1. Left ventricular ejection fraction, by estimation,  is 60 to 65%. The left ventricle has normal function. The left ventricle has no regional wall motion abnormalities. There is mild left ventricular hypertrophy. Left ventricular diastolic parameters are  consistent with Grade III diastolic dysfunction (restrictive). Elevated left atrial pressure.  2. Right ventricular systolic function is normal. The right ventricular size is normal. Tricuspid regurgitation signal is inadequate for assessing PA pressure.  3. Left atrial size was mildly dilated.  4. A small pericardial effusion is present.  5. The mitral valve is degenerative. Trivial mitral valve regurgitation. No evidence of mitral stenosis. Moderate mitral annular calcification.  6. The aortic valve is tricuspid. Aortic valve regurgitation is not visualized. No aortic stenosis is present. FINDINGS  Left Ventricle: Left ventricular ejection fraction, by estimation, is 60 to 65%. The left ventricle has normal function. The left ventricle has no regional wall motion abnormalities. The left ventricular internal cavity size was normal in size. There is  mild left ventricular hypertrophy. Left ventricular diastolic parameters are consistent with Grade III diastolic dysfunction (restrictive). Elevated left atrial pressure. Right Ventricle: The right ventricular size is normal. No increase in right ventricular wall thickness. Right ventricular systolic function is normal. Tricuspid regurgitation signal is inadequate for assessing PA pressure. Left Atrium: Left atrial size was mildly dilated. Right Atrium: Right atrial size was normal in size. Pericardium: A small pericardial effusion is present. Mitral Valve: The mitral valve is degenerative in appearance. Moderate mitral annular calcification. Trivial mitral valve regurgitation. No evidence of mitral valve stenosis. MV peak gradient, 7.6 mmHg. The mean mitral valve gradient is 2.0 mmHg. Tricuspid Valve: The tricuspid valve is normal in structure. Tricuspid valve regurgitation is trivial. Aortic Valve: The aortic valve is tricuspid. Aortic valve regurgitation is not visualized. No aortic stenosis is present. Aortic valve mean gradient measures 5.0 mmHg. Aortic valve peak  gradient measures 8.9 mmHg. Aortic valve area, by VTI measures 3.03 cm. Pulmonic Valve: The pulmonic valve was not well visualized. Pulmonic valve regurgitation is not visualized. Aorta: The aortic root and ascending aorta are structurally normal, with no evidence of dilitation. IAS/Shunts: The interatrial septum was not well visualized.  LEFT VENTRICLE PLAX 2D LVIDd:         5.20 cm   Diastology LVIDs:         3.20 cm   LV e' medial:    6.85 cm/s LV PW:         1.10 cm   LV E/e' medial:  18.7 LV IVS:        0.70 cm   LV e' lateral:   4.68 cm/s LVOT diam:     2.00 cm   LV E/e' lateral: 27.4 LV SV:         98 LV SV Index:   54 LVOT Area:     3.14 cm  RIGHT VENTRICLE         IVC TAPSE (M-mode): 1.6 cm  IVC diam: 2.40 cm LEFT ATRIUM             Index        RIGHT ATRIUM           Index LA Vol (A2C):   59.3 ml 32.90 ml/m  RA Area:     17.20 cm LA Vol (A4C):   56.4 ml 31.29 ml/m  RA Volume:   43.20 ml  23.97 ml/m LA Biplane Vol: 62.9 ml 34.90 ml/m  AORTIC VALVE AV Area (Vmax):    2.68 cm AV Area (Vmean):   2.68 cm  AV Area (VTI):     3.03 cm AV Vmax:           149.00 cm/s AV Vmean:          100.000 cm/s AV VTI:            0.324 m AV Peak Grad:      8.9 mmHg AV Mean Grad:      5.0 mmHg LVOT Vmax:         127.00 cm/s LVOT Vmean:        85.300 cm/s LVOT VTI:          0.312 m LVOT/AV VTI ratio: 0.96  AORTA Ao Root diam: 2.40 cm Ao Asc diam:  3.00 cm MITRAL VALVE MV Area (PHT): 4.31 cm     SHUNTS MV Area VTI:   2.62 cm     Systemic VTI:  0.31 m MV Peak grad:  7.6 mmHg     Systemic Diam: 2.00 cm MV Mean grad:  2.0 mmHg MV Vmax:       1.38 m/s MV Vmean:      62.4 cm/s MV Decel Time: 176 msec MV E velocity: 128.00 cm/s MV A velocity: 49.80 cm/s MV E/A ratio:  2.57 Carson Clara MD Electronically signed by Carson Clara MD Signature Date/Time: 11/19/2023/6:35:52 PM    Final    CT Angio Chest PE W and/or Wo Contrast Result Date: 11/18/2023 CLINICAL DATA:  Pulmonary embolism (PE) suspected, high prob fever,  cough. EXAM: CT ANGIOGRAPHY CHEST WITH CONTRAST TECHNIQUE: Multidetector CT imaging of the chest was performed using the standard protocol during bolus administration of intravenous contrast. Multiplanar CT image reconstructions and MIPs were obtained to evaluate the vascular anatomy. RADIATION DOSE REDUCTION: This exam was performed according to the departmental dose-optimization program which includes automated exposure control, adjustment of the mA and/or kV according to patient size and/or use of iterative reconstruction technique. CONTRAST:  OMNIPAQUE  IOHEXOL  350 MG/ML SOLN COMPARISON:  07/10/2022 FINDINGS: Cardiovascular: No filling defects in the pulmonary arteries to suggest pulmonary emboli. Heart mildly enlarged. Aorta normal caliber. Scattered coronary artery and aortic atherosclerosis. Mediastinum/Nodes: Right paratracheal lymph node has a short axis diameter of 1.6 cm compared to 1.0 cm previously. No axillary or hilar adenopathy. Trachea and esophagus are unremarkable. Thyroid  unremarkable. Lungs/Pleura: Small right pleural effusion. Dependent atelectasis in the lower lobes. No confluent opacities. Upper Abdomen: No acute findings Musculoskeletal: Chest wall soft tissues are unremarkable. No acute bony abnormality. Review of the MIP images confirms the above findings. IMPRESSION: No evidence of pulmonary embolus. Coronary artery disease. Mediastinal adenopathy with mildly enlarged right paratracheal lymph node, slightly larger than prior study. This could be reactive. This could be followed with repeat CT in 6 months to ensure stability. Small right pleural effusion. Aortic Atherosclerosis (ICD10-I70.0). Electronically Signed   By: Janeece Mechanic M.D.   On: 11/18/2023 20:23   DG Chest 2 View Result Date: 11/18/2023 CLINICAL DATA:  Suspected sepsis.  Fever.  Weakness. EXAM: CHEST - 2 VIEW COMPARISON:  07/15/2023. FINDINGS: Low lung volume. Bilateral lung fields are clear. Bilateral costophrenic  angles are clear. Stable cardio-mediastinal silhouette. No acute osseous abnormalities. The soft tissues are within normal limits. IMPRESSION: No active cardiopulmonary disease. Electronically Signed   By: Beula Brunswick M.D.   On: 11/18/2023 18:22    Microbiology: Results for orders placed or performed during the hospital encounter of 11/18/23  Culture, blood (Routine x 2)     Status: None   Collection Time: 11/18/23  4:54 PM   Specimen: BLOOD  Result Value Ref Range Status   Specimen Description   Final    BLOOD LEFT ANTECUBITAL Performed at [facility], [address], [place], [place] [address]    Special Requests   Final    Blood Culture adequate volume BOTTLES DRAWN AEROBIC AND ANAEROBIC Performed at [facility], [address] [place], [place] [address]    Culture   Final    NO GROWTH 5 DAYS Performed at [facility], [address] [place], [place] [address]    Report Status 11/23/2023 FINAL  Final  Culture, blood (Routine x 2)     Status: None   Collection Time: 11/18/23  4:59 PM   Specimen: BLOOD RIGHT FOREARM  Result Value Ref Range Status   Specimen Description   Final    BLOOD RIGHT FOREARM Performed at [facility], [address] [place], [place] [address]    Special Requests   Final    Blood Culture adequate volume BOTTLES DRAWN AEROBIC AND ANAEROBIC Performed at [facility], [address] [place], [place] [address]    Culture   Final    NO GROWTH 5 DAYS Performed at [facility], [address] [place], [place] [address]    Report Status 11/23/2023 FINAL  Final  Resp panel by RT-PCR (RSV, Flu A&B, Covid) Anterior Nasal Swab     Status: None   Collection Time: 11/18/23  5:09 PM   Specimen: Anterior Nasal Swab  Result Value Ref Range Status   SARS Coronavirus 2 by RT PCR NEGATIVE NEGATIVE Final    Comment: (NOTE) SARS-CoV-2 target nucleic acids are NOT DETECTED.  The  SARS-CoV-2 RNA is generally detectable in upper respiratory specimens during the acute phase of infection. The lowest concentration of SARS-CoV-2 viral copies this assay can detect is 138 copies/mL. A negative result does not preclude SARS-Cov-2 infection and should not be used as the sole basis for treatment or other patient management decisions. A negative result may occur with  improper specimen collection/handling, submission of specimen other than nasopharyngeal swab, presence of viral mutation(s) within the areas targeted by this assay, and inadequate number of viral copies(<138 copies/mL). A negative result must be combined with clinical observations, patient history, and epidemiological information. The expected result is Negative.  Fact Sheet for Patients:  BloggerCourse.com  Fact Sheet for Healthcare Providers:  SeriousBroker.it  This test is no t yet approved or cleared by the [place]  FDA and  has been authorized for detection and/or diagnosis of SARS-CoV-2 by FDA under an Emergency Use Authorization (EUA). This EUA will remain  in effect (meaning this test can be used) for the duration of the COVID-19 declaration under Section 564(b)(1) of the Act, 21 U.S.C.section 360bbb-3(b)(1), unless the authorization is terminated  or revoked sooner.       Influenza A by PCR NEGATIVE NEGATIVE Final   Influenza B by PCR NEGATIVE NEGATIVE Final    Comment: (NOTE) The Xpert Xpress SARS-CoV-2/FLU/RSV plus assay is intended as an aid in the diagnosis of influenza from Nasopharyngeal swab specimens and should not be used as a sole basis for treatment. Nasal washings and aspirates are unacceptable for Xpert Xpress SARS-CoV-2/FLU/RSV testing.  Fact Sheet for Patients: BloggerCourse.com  Fact Sheet for Healthcare Providers: SeriousBroker.it  This test is not yet approved or  cleared by the [place]  FDA and has been authorized for detection and/or diagnosis of SARS-CoV-2 by FDA under an  Emergency Use Authorization (EUA). This EUA will remain in effect (meaning this test can be used) for the duration of the COVID-19 declaration under Section 564(b)(1) of the Act, 21 U.S.C. section 360bbb-3(b)(1), unless the authorization is terminated or revoked.     Resp Syncytial Virus by PCR NEGATIVE NEGATIVE Final    Comment: (NOTE) Fact Sheet for Patients: BloggerCourse.com  Fact Sheet for Healthcare Providers: SeriousBroker.it  This test is not yet approved or cleared by the [place]  FDA and has been authorized for detection and/or diagnosis of SARS-CoV-2 by FDA under an Emergency Use Authorization (EUA). This EUA will remain in effect (meaning this test can be used) for the duration of the COVID-19 declaration under Section 564(b)(1) of the Act, 21 U.S.C. section 360bbb-3(b)(1), unless the authorization is terminated or revoked.  Performed at [facility], [address], [place], [place] [address]   Respiratory (~20 pathogens) panel by PCR     Status: Abnormal   Collection Time: 11/19/23  1:38 AM   Specimen: Nasopharyngeal Swab; Respiratory  Result Value Ref Range Status   Adenovirus NOT DETECTED NOT DETECTED Final   Coronavirus 229E NOT DETECTED NOT DETECTED Final    Comment: (NOTE) The Coronavirus on the Respiratory Panel, DOES NOT test for the novel  Coronavirus (2019 nCoV)    Coronavirus HKU1 NOT DETECTED NOT DETECTED Final   Coronavirus NL63 NOT DETECTED NOT DETECTED Final   Coronavirus OC43 DETECTED (A) NOT DETECTED Final   Metapneumovirus NOT DETECTED NOT DETECTED Final   Rhinovirus / Enterovirus NOT DETECTED NOT DETECTED Final   Influenza A NOT DETECTED NOT DETECTED Final   Influenza B NOT DETECTED NOT DETECTED Final   Parainfluenza Virus 1 NOT DETECTED NOT DETECTED  Final   Parainfluenza Virus 2 NOT DETECTED NOT DETECTED Final   Parainfluenza Virus 3 NOT DETECTED NOT DETECTED Final   Parainfluenza Virus 4 NOT DETECTED NOT DETECTED Final   Respiratory Syncytial Virus NOT DETECTED NOT DETECTED Final   Bordetella pertussis NOT DETECTED NOT DETECTED Final   Bordetella Parapertussis NOT DETECTED NOT DETECTED Final   Chlamydophila pneumoniae NOT DETECTED NOT DETECTED Final   Mycoplasma pneumoniae NOT DETECTED NOT DETECTED Final    Comment: Performed at [facility], [address], [place], [place] [address]  Urine Culture     Status: None   Collection Time: 11/19/23  2:37 AM   Specimen: Urine, Random  Result Value Ref Range Status   Specimen Description   Final    URINE, RANDOM Performed at [facility], [address] [place], [place] [address]    Special Requests   Final    NONE Reflexed from [id number] Performed at [facility], [address] [place], [place] [address]    Culture   Final    NO GROWTH Performed at [facility], [address], [place], [place] [address]    Report Status 11/20/2023 FINAL  Final    Labs: CBC: Recent Labs  Lab 11/18/23 1709 11/19/23 0600 11/20/23 0516 11/21/23 0321 11/21/23 2014 11/22/23 1355  WBC 13.9* 11.1* 8.0 6.0  --  6.9  NEUTROABS 12.1* 7.9*  --   --   --   --   HGB 8.8* 9.2* 7.6* 7.4* 9.7* 9.2*  HCT 27.5* 30.1* 25.2* 24.2* 30.9* 29.8*  MCV 81.4 84.3 86.0 84.6  --  84.2  PLT 219 232 184 173  --  209   Basic Metabolic Panel: Recent Labs  Lab 11/18/23 1709 11/19/23  0600 11/20/23 0516 11/22/23 1355 11/23/23 1022  NA 137 136 135 131* 136  K 3.9 3.6 3.5 3.1* 3.7  CL 101 102 103 96* 102  CO2 24 23 21* 23 25  GLUCOSE 178* 189* 197* 222* 223*  BUN 13 10 12 10 10   CREATININE 0.56 0.58 0.65 0.51 0.52  CALCIUM  8.9 8.8* 8.1* 8.3* 8.5*   Liver Function Tests: Recent Labs  Lab 11/18/23 1709 11/19/23 0600  AST 12* 21  ALT 10 13  ALKPHOS 74 74   BILITOT 0.8 0.9  PROT 6.7 7.0  ALBUMIN 4.1 3.8   CBG: Recent Labs  Lab 11/22/23 1118 11/22/23 1710 11/22/23 2100 11/23/23 0743 11/23/23 1223  GLUCAP 233* 207* 173* 183* 185*    Discharge time spent: greater than 30 minutes.  Signed: Danette Duos, MD [facility] 11/23/2023

## 2023-11-24 ENCOUNTER — Telehealth: Payer: Self-pay | Admitting: Family Medicine

## 2023-11-24 ENCOUNTER — Telehealth: Payer: Self-pay | Admitting: *Deleted

## 2023-11-24 MED ORDER — PREDNISONE 10 MG (21) PO TBPK: ORAL_TABLET | ORAL | 0 refills | Status: DC

## 2023-11-24 NOTE — Telephone Encounter (Signed)
 Prednisone Prescription sent to pharmacy

## 2023-11-24 NOTE — Transitions of Care (Post Inpatient/ED Visit) (Signed)
 11/24/2023  Name: Kristi Willis MRN: [id number] DOB: July 28, 1941  Today's TOC FU Call Status: Today's TOC FU Call Status:: Successful TOC FU Call Completed TOC FU Call Complete Date: 11/24/23 Patient's Name and Date of Birth confirmed.  Transition Care Management Follow-up Telephone Call Date of Discharge: 11/23/23 Discharge Facility: [facility]) Type of Discharge: Inpatient Admission Primary Inpatient Discharge Diagnosis:: Fever How have you been since you were released from the hospital?: Better Any questions or concerns?: No  Items Reviewed: Did you receive and understand the discharge instructions provided?: Yes Medications obtained,verified, and reconciled?: Yes (Medications Reviewed) Any new allergies since your discharge?: No Dietary orders reviewed?: No Do you have support at home?: Yes People in Home [RPT]: alone Name of Support/Comfort Primary Source: Elvera Maria primary caregiver  Medications Reviewed Today: Medications Reviewed Today     Reviewed by Luella Cook, RN (Case Manager) on 11/24/23 at 1556  Med List Status: <None>   Medication Order Taking? Sig Documenting Provider Last Dose Status Informant  Accu-Chek Softclix Lancets lancets [id number] Yes CHECK BLOOD SUGAR ONCE DAILY OR AS DIRECTED Dx E11.40 Junie Spencer, FNP Taking Active Child  acyclovir ointment (ZOVIRAX) 5 % [id number] Yes APPLY TO THE AFFECTED AREA AROUND MOUTH EVERY 3 HOURS AS DIRECTED UNTIL CLEAR Hawks, Christy A, FNP Taking Active   albuterol (VENTOLIN HFA) 108 (90 Base) MCG/ACT inhaler [id number] Yes INHALE TWO PUFFS EVERY 6 HOURS AS NEEDED FOR WHEEZING OR SHORTNESS OF BREATH Jannifer Rodney A, FNP Taking Active   amLODipine (NORVASC) 5 MG tablet [id number] Yes TAKE ONE TABLET BY MOUTH DAILY Junie Spencer, FNP Taking Active   amoxicillin-clavulanate (AUGMENTIN) 875-125 MG tablet [id number] Yes Take 1 tablet by mouth 2 (two) times daily for 3 days. Regalado, Prentiss Bells, MD Taking  Active   aspirin 81 MG chewable tablet [id number] Yes Chew 81 mg by mouth at bedtime. [provider] Taking Active Child  azelastine (ASTELIN) 0.1 % nasal spray [id number] Yes Place 2 sprays into both nostrils 2 (two) times daily. Deatra Canter, FNP Taking Active            Med Note Greggory Stallion, STEPHANIE A   Wed Jan 24, 2021  3:27 PM)    benzonatate (TESSALON) 200 MG capsule [id number] Yes Take 1 capsule (200 mg total) by mouth 3 (three) times daily as needed. Junie Spencer, FNP Taking Active   budesonide (PULMICORT) 0.25 MG/2ML nebulizer solution [id number] Yes Take 2 mLs (0.25 mg total) by nebulization 2 (two) times daily. Regalado, Prentiss Bells, MD Taking Active   cetirizine (ZYRTEC) 10 MG tablet [id number] Yes TAKE ONE TABLET DAILY Junie Spencer, FNP Taking Active   Coenzyme Q10 (COQ10 PO) [id number] Yes Take 1 tablet by mouth daily. [provider] Taking Active Child  cyanocobalamin (VITAMIN B12) 500 MCG tablet [id number] Yes Take 1 tablet (500 mcg total) by mouth daily. Regalado, Belkys A, MD Taking Active   doxazosin (CARDURA) 8 MG tablet [id number] Yes TAKE (1) TABLET DAILY FOR HIGH BLOOD PRESSURE. Junie Spencer, FNP Taking Active   EPIPEN 2-PAK 0.3 MG/0.3ML SOAJ injection [id number] Yes Inject 0.3 mg into the muscle as needed for anaphylaxis. Reported on 01/24/2016 [provider] Taking Active Child           Med Note Myna Hidalgo A   Wed Jan 24, 2021  3:27 PM)    escitalopram (LEXAPRO) 20 MG tablet [id number] Yes TAKE ONE TABLET BY MOUTH DAILY Junie Spencer, FNP Taking Active  fluticasone (FLONASE) 50 MCG/ACT nasal spray [id number] Yes Place 1 spray into both nostrils daily. [provider] Taking Active   furosemide (LASIX) 40 MG tablet [id number] Yes Take 1 tablet (40 mg total) by mouth daily for 7 days. Regalado, Belkys A, MD Taking Active   glucose blood (ACCU-CHEK AVIVA PLUS) test strip [id number] Yes CHECK BLOOD SUGAR ONCE DAILY OR AS DIRECTED Dx  E11.40 Junie Spencer, FNP Taking Active Child  guaiFENesin (MUCINEX) 600 MG 12 hr tablet [id number] Yes Take 2 tablets (1,200 mg total) by mouth 2 (two) times daily. Regalado, Belkys A, MD Taking Active   ipratropium-albuterol (DUONEB) 0.5-2.5 (3) MG/3ML SOLN [id number] Yes Take 3 mLs by nebulization every 6 (six) hours. Alba Cory, MD Taking Active   JANUVIA 50 MG tablet [id number] Yes TAKE ONE TABLET BY MOUTH DAILY Hawks, Christy A, FNP Taking Active   ketoconazole (NIZORAL) 2 % cream [id number] Yes Apply 1 application topically daily. Junie Spencer, FNP Taking Active   levothyroxine (SYNTHROID) 75 MCG tablet [id number] Yes TAKE ONE TABLET DAILY BEFORE BREAKFAST Junie Spencer, FNP Taking Active   lisinopril (ZESTRIL) 40 MG tablet [id number] Yes TAKE ONE TABLET DAILY Junie Spencer, FNP Taking Active   metFORMIN (GLUCOPHAGE) 1000 MG tablet [id number] Yes TAKE  (1)  TABLET TWICE A DAY WITH MEALS (BREAKFAST AND SUPPER) Jannifer Rodney A, FNP Taking Active   metoprolol succinate (TOPROL-XL) 100 MG 24 hr tablet [id number] Yes TAKE ONE TABLET DAILY Jannifer Rodney A, FNP Taking Active   montelukast (SINGULAIR) 10 MG tablet [id number] Yes TAKE ONE TABLET AT BEDTIME Jannifer Rodney A, FNP Taking Active   predniSONE (STERAPRED UNI-PAK 21 TAB) 10 MG (21) TBPK tablet [id number] Yes Use as directed Junie Spencer, FNP Taking Active   rosuvastatin (CRESTOR) 20 MG tablet [id number]  Take 1 tablet (20 mg total) by mouth at bedtime. Junie Spencer, FNP  Expired 11/19/23 2359             Home Care and Equipment/Supplies: Were Home Health Services Ordered?: NA Any new equipment or medical supplies ordered?: Yes Name of Medical supply agency?: [employer] Were you able to get the equipment/medical supplies?: Yes Do you have any questions related to the use of the equipment/supplies?: No  Functional Questionnaire: Do you need assistance with bathing/showering or dressing?: No Do you need assistance  with meal preparation?: Yes Do you need assistance with eating?: No Do you have difficulty maintaining continence: No Do you need assistance with getting out of bed/getting out of a chair/moving?: No Do you have difficulty managing or taking your medications?: No  Follow up appointments reviewed: PCP Follow-up appointment confirmed?: Yes Date of PCP follow-up appointment?: 11/28/23 Follow-up Provider: Jannifer Rodney FNP Specialist [facility] Follow-up appointment confirmed?: No Reason Specialist Follow-Up Not Confirmed: Patient has Specialist Provider Number and will Call for Appointment (Per daughter she will f/u with appointment with cardiology) Do you need transportation to your follow-up appointment?: No Do you understand care options if your condition(s) worsen?: Yes-patient verbalized understanding  SDOH Interventions Today    Flowsheet Row Most Recent Value  SDOH Interventions   Food Insecurity Interventions Intervention Not Indicated  Housing Interventions Intervention Not Indicated  Transportation Interventions Intervention Not Indicated, Patient Resources (Friends/Family)  [daughter provides tranasportation]  Utilities Interventions Intervention Not Indicated       Goals Addressed             This Visit's Progress    VBCI Transitions of Care (TOC) Care Plan  Problems:  Recent Hospitalization for treatment of  Fever Equipment/DME barrier Nocturnal Oxygen. [employer] delivered 1030 last night  Goal:  Over the next 30 days, the patient will not experience hospital readmission  Interventions:  Transitions of Care:  New goal. Doctor Visits  - discussed the importance of doctor visits  Patient Self Care Activities:  Attend all scheduled provider appointments Call pharmacy for medication refills 3-7 days in advance of running out of medications Call provider office for new concerns or questions  Take medications as prescribed    Plan:  Next PCP appointment  scheduled for: [phone]       No further TOC outreach needed Patient declined [facility] services  Gean Maidens BSN RN [facility] Health Care Management Coordinator [email or web address] Direct Dial: [phone]  Fax: [phone] Website: [email or web address]

## 2023-11-28 ENCOUNTER — Ambulatory Visit: Admitting: Family

## 2023-11-28 ENCOUNTER — Encounter: Payer: Self-pay | Admitting: Family

## 2023-11-28 VITALS — BP 179/65 | HR 68 | Temp 98.0°F | Ht 64.0 in | Wt 153.8 lb

## 2023-11-28 DIAGNOSIS — E1142 Type 2 diabetes mellitus with diabetic polyneuropathy: Secondary | ICD-10-CM

## 2023-11-28 DIAGNOSIS — R509 Fever, unspecified: Secondary | ICD-10-CM

## 2023-11-28 DIAGNOSIS — Z87440 Personal history of urinary (tract) infections: Secondary | ICD-10-CM

## 2023-11-28 DIAGNOSIS — I10 Essential (primary) hypertension: Secondary | ICD-10-CM | POA: Diagnosis not present

## 2023-11-28 DIAGNOSIS — I509 Heart failure, unspecified: Secondary | ICD-10-CM | POA: Diagnosis not present

## 2023-11-28 DIAGNOSIS — R531 Weakness: Secondary | ICD-10-CM

## 2023-11-28 DIAGNOSIS — D509 Iron deficiency anemia, unspecified: Secondary | ICD-10-CM | POA: Diagnosis not present

## 2023-11-28 LAB — URINALYSIS, COMPLETE
Bilirubin, UA: NEGATIVE
Ketones, UA: NEGATIVE
Leukocytes,UA: NEGATIVE
Nitrite, UA: NEGATIVE
Specific Gravity, UA: 1.015 (ref 1.005–1.030)
Urobilinogen, Ur: 0.2 mg/dL (ref 0.2–1.0)
pH, UA: 6 (ref 5.0–7.5)

## 2023-11-28 MED ORDER — FUROSEMIDE 20 MG PO TABS: 20.0000 mg | ORAL_TABLET | Freq: Every day | ORAL | 3 refills | Status: DC

## 2023-11-28 NOTE — Progress Notes (Signed)
 Subjective:    Patient ID: Kristi Willis, female    DOB: 08/04/1941, 83 y.o.   MRN: [id number]  Chief Complaint  Patient presents with   Transitions Of Care   Today's visit was for Transitional Care Management.  The patient was discharged from [facility] on 11/23/23 with a primary diagnosis of URI, UTI,and fever.   Contact with the patient and/or caregiver, by a clinical staff member, was made on 11/24/23 and was documented as a telephone encounter within the EMR.  Through chart review and discussion with the patient I have determined that management of their condition is of moderate complexity.   She had weakness and fever and went to the ED on on 11/19/23. She was anemic and had to get PRBC. She had a chest x-ray that showed, "Minimal bibasilar subsegmental atelectasis. ".   She was diagnosed with CHF and started on lasix 20 mg daily. She has a follow up appointment with Cardiologists 12/04/23. Her weight is stable. She was discharged on home oxygen and she only wears 2 L at night.      11/28/2023    3:38 PM 11/24/2023    4:01 PM 11/19/2023    1:06 AM  Last 3 Weights  Weight (lbs) 153 lb 12.8 oz -- 164 lb 14.5 oz  Weight (kg) 69.763 kg -- 74.8 kg      Cough This is a recurrent problem. The current episode started 1 to 4 weeks ago. The problem has been gradually improving. The problem occurs every few minutes. The cough is Non-productive. Pertinent negatives include no shortness of breath.  Hypertension This is a chronic problem. The current episode started more than 1 year ago. The problem is uncontrolled. Associated symptoms include malaise/fatigue. Pertinent negatives include no blurred vision, peripheral edema or shortness of breath. Risk factors for coronary artery disease include sedentary lifestyle. The current treatment provides mild improvement.  Diabetes She presents for her follow-up diabetic visit. She has type 2 diabetes mellitus. Associated symptoms include foot  paresthesias. Pertinent negatives for diabetes include no blurred vision. Diabetic complications include peripheral neuropathy. Risk factors for coronary artery disease include diabetes mellitus, dyslipidemia, hypertension, post-menopausal and sedentary lifestyle. (Does not check glucose )      Review of Systems  Constitutional:  Positive for malaise/fatigue.  Eyes:  Negative for blurred vision.  Respiratory:  Positive for cough. Negative for shortness of breath.   All other systems reviewed and are negative.   Social History   Socioeconomic History   Marital status: Widowed    Spouse name: Not on file   Number of children: 1   Years of education: Not on file   Highest education level: 12th grade  Occupational History   Occupation: Retired from [employer]    Comment: retired  Tobacco Use   Smoking status: Never   Smokeless tobacco: Never  Vaping Use   Vaping status: Never Used  Substance and Sexual Activity   Alcohol use: No   Drug use: No   Sexual activity: Not Currently  Other Topics Concern   Not on file  Social History Narrative   Lives home alone - daughter lives about 5 miles away.   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   Social  Drivers of Health   Financial Resource Strain: Low Risk  (03/10/2023)   Overall Financial Resource Strain (CARDIA)    Difficulty of Paying Living Expenses: Not hard at all  Food Insecurity: No Food Insecurity (11/24/2023)   Hunger Vital Sign    Worried About Running Out of Food in the Last Year: Never true    Ran Out of Food in the Last Year: Never true  Transportation Needs: No Transportation Needs (11/24/2023)   PRAPARE - Administrator, Civil Service (Medical): No    Lack of  Transportation (Non-Medical): No  Physical Activity: Inactive (03/10/2023)   Exercise Vital Sign    Days of Exercise per Week: 0 days    Minutes of Exercise per Session: 0 min  Stress: No Stress Concern Present (03/10/2023)   [employer] of Occupational Health - Occupational Stress Questionnaire    Feeling of Stress : Not at all  Social Connections: Socially Isolated (11/19/2023)   Social Connection and Isolation Panel [NHANES]    Frequency of Communication with Friends and Family: More than three times a week    Frequency of Social Gatherings with Friends and Family: More than three times a week    Attends Religious Services: Never    Database administrator or Organizations: No    Attends Banker Meetings: Never    Marital Status: Widowed   Family History  Problem Relation Age of Onset   Cancer Mother 68       cervical cancer   Depression Mother    Heart disease Father    Stroke Father    Cancer Father        lung cancer   Heart disease Sister 8       MI   Hypertension Sister    Cancer Sister 59       breast cancer   Healthy Daughter         Objective:   Physical Exam Vitals reviewed.  Constitutional:      General: She is not in acute distress.    Appearance: She is well-developed.  HENT:     Head: Normocephalic and atraumatic.     Right Ear: Tympanic membrane normal.     Left Ear: Tympanic membrane normal.  Eyes:     Pupils: Pupils are equal, round, and reactive to light.  Neck:     Thyroid: No thyromegaly.  Cardiovascular:     Rate and Rhythm: Normal rate and regular rhythm.     Heart sounds: Normal heart sounds. No murmur heard. Pulmonary:     Effort: Pulmonary effort is normal. No respiratory distress.     Breath sounds: Normal breath sounds. No wheezing.  Abdominal:     General: Bowel sounds are normal. There is no distension.     Palpations: Abdomen is soft.     Tenderness: There is no abdominal tenderness.  Musculoskeletal:         General: No tenderness. Normal range of motion.     Cervical back: Normal range of motion and neck supple.     Right lower leg: Edema (trace) present.     Left lower leg: Edema (trace) present.  Skin:    General: Skin is warm and dry.  Neurological:     Mental Status: She is alert and oriented to person, place, and time.     Cranial Nerves: No cranial nerve deficit.     Deep Tendon Reflexes: Reflexes are normal and symmetric.  Psychiatric:  Behavior: Behavior normal.        Thought Content: Thought content normal.        Judgment: Judgment normal.     Comments: Aphasia        BP (!) 179/65   Pulse 68   Temp 98 F (36.7 C)   Ht 5\' 4"  (1.626 m)   Wt 153 lb 12.8 oz (69.8 kg)   SpO2 98%   BMI 26.40 kg/m      Assessment & Plan:  JAKARA BLATTER comes in today with chief complaint of Transitions Of Care   Diagnosis and orders addressed:  1. Essential hypertension (Primary) - CMP14+EGFR  2. Weakness - CMP14+EGFR  3. Iron deficiency anemia, unspecified iron deficiency anemia type - Anemia Profile B - CMP14+EGFR  4. Chronic congestive heart failure, unspecified heart failure type (HCC) - CMP14+EGFR - furosemide (LASIX) 20 MG tablet; Take 1 tablet (20 mg total) by mouth daily.  Dispense: 30 tablet; Refill: 3 - Brain natriuretic peptide  5. Type 2 diabetes mellitus with diabetic polyneuropathy, without long-term current use of insulin (HCC) - Microalbumin / creatinine urine ratio  6. History of UTI - Urinalysis, Complete   Labs pending Will decrease Lasix to 20 mg from 40 mg  Keep Cardiologists follow up Need to monitor BP at home. Make sure Mucinex she is taking is plain and does not have decongestant.  Continue current medications  Keep follow up with specialists  Health Maintenance reviewed Diet and exercise encouraged Keep chronic follow up   Jannifer Rodney, FNP

## 2023-11-28 NOTE — Patient Instructions (Signed)

## 2023-11-29 LAB — ANEMIA PROFILE B
Basophils Absolute: 0 10*3/uL (ref 0.0–0.2)
Basos: 0 %
EOS (ABSOLUTE): 0 10*3/uL (ref 0.0–0.4)
Eos: 0 %
Ferritin: 158 ng/mL — ABNORMAL HIGH (ref 15–150)
Folate: >20 ng/mL (ref >3.0)
Hematocrit: 37.6 % (ref 34.0–46.6)
Hemoglobin: 11.6 g/dL (ref 11.1–15.9)
Immature Grans (Abs): 0.1 10*3/uL (ref 0.0–0.1)
Immature Granulocytes: 1 %
Iron Saturation: 18 % (ref 15–55)
Iron: 64 ug/dL (ref 27–139)
Lymphocytes Absolute: 1.1 10*3/uL (ref 0.7–3.1)
Lymphs: 9 %
MCH: 26 pg — ABNORMAL LOW (ref 26.6–33.0)
MCHC: 30.9 g/dL — ABNORMAL LOW (ref 31.5–35.7)
MCV: 84 fL (ref 79–97)
Monocytes Absolute: 0.4 10*3/uL (ref 0.1–0.9)
Monocytes: 4 %
Neutrophils Absolute: 10.5 10*3/uL — ABNORMAL HIGH (ref 1.4–7.0)
Neutrophils: 86 %
Platelets: 382 10*3/uL (ref 150–450)
RBC: 4.46 x10E6/uL (ref 3.77–5.28)
RDW: 13.6 % (ref 11.7–15.4)
Retic Ct Pct: 1.8 % (ref 0.6–2.6)
Total Iron Binding Capacity: 359 ug/dL (ref 250–450)
UIBC: 295 ug/dL (ref 118–369)
Vitamin B-12: 918 pg/mL (ref 232–1245)
WBC: 12.1 10*3/uL — ABNORMAL HIGH (ref 3.4–10.8)

## 2023-11-29 LAB — MICROALBUMIN / CREATININE URINE RATIO
Creatinine, Urine: 25.7 mg/dL
Microalb/Creat Ratio: 535 mg/g{creat} — ABNORMAL HIGH (ref 0–29)
Microalbumin, Urine: 137.4 ug/mL

## 2023-11-29 LAB — CMP14+EGFR
ALT: 36 IU/L — ABNORMAL HIGH (ref 0–32)
AST: 65 IU/L — ABNORMAL HIGH (ref 0–40)
Albumin: 4.4 g/dL (ref 3.7–4.7)
Alkaline Phosphatase: 118 IU/L (ref 44–121)
BUN/Creatinine Ratio: 26 (ref 12–28)
BUN: 21 mg/dL (ref 8–27)
Bilirubin Total: 0.4 mg/dL (ref 0.0–1.2)
CO2: 23 mmol/L (ref 20–29)
Calcium: 9.7 mg/dL (ref 8.7–10.3)
Chloride: 93 mmol/L — ABNORMAL LOW (ref 96–106)
Creatinine, Ser: 0.81 mg/dL (ref 0.57–1.00)
Globulin, Total: 2.3 g/dL (ref 1.5–4.5)
Glucose: 439 mg/dL (ref 70–99)
Potassium: 4.4 mmol/L (ref 3.5–5.2)
Sodium: 135 mmol/L (ref 134–144)
Total Protein: 6.7 g/dL (ref 6.0–8.5)
eGFR: 72 mL/min/{1.73_m2} (ref >59)

## 2023-11-29 LAB — BRAIN NATRIURETIC PEPTIDE: BNP: 98.9 pg/mL (ref 0.0–100.0)

## 2023-12-01 ENCOUNTER — Inpatient Hospital Stay: Admitting: Family

## 2023-12-01 ENCOUNTER — Other Ambulatory Visit: Payer: Self-pay | Admitting: Family

## 2023-12-01 ENCOUNTER — Telehealth: Payer: Self-pay

## 2023-12-01 MED ORDER — DOXYCYCLINE HYCLATE 100 MG PO TABS: 100.0000 mg | ORAL_TABLET | Freq: Two times a day (BID) | ORAL | 0 refills | Status: DC

## 2023-12-03 ENCOUNTER — Encounter: Payer: Self-pay | Admitting: Physician Assistant

## 2023-12-03 ENCOUNTER — Ambulatory Visit: Attending: Physician Assistant | Admitting: Physician Assistant

## 2023-12-03 VITALS — BP 133/54 | HR 53 | Ht 64.0 in | Wt 154.4 lb

## 2023-12-03 DIAGNOSIS — G3101 Pick's disease: Secondary | ICD-10-CM

## 2023-12-03 DIAGNOSIS — I10 Essential (primary) hypertension: Secondary | ICD-10-CM

## 2023-12-03 DIAGNOSIS — F0280 Dementia in other diseases classified elsewhere without behavioral disturbance: Secondary | ICD-10-CM | POA: Diagnosis not present

## 2023-12-03 DIAGNOSIS — I4719 Other supraventricular tachycardia: Secondary | ICD-10-CM

## 2023-12-03 DIAGNOSIS — D72829 Elevated white blood cell count, unspecified: Secondary | ICD-10-CM

## 2023-12-03 DIAGNOSIS — I5032 Chronic diastolic (congestive) heart failure: Secondary | ICD-10-CM

## 2023-12-03 NOTE — Progress Notes (Signed)
 Cardiology Office Note:  .   Date:  12/03/2023  ID:  [name], DOB Jul 12, 1941, MRN [id number] PCP: Junie Spencer, FNP  [facility] Providers Cardiologist:  Little Ishikawa, MD     History of Present Illness: .   FIZZA SCALES is a 83 y.o. female with past medical history of HTN, HLD, DM II, atrial tachycardia, aphasia and asthma.  She was initially referred to cardiology service for evaluation of chest discomfort.  She was previously followed by Dr. Sherryl Manges for atrial tachycardia and has been placed on Toprol-XL.  TTE obtained on 07/29/2019 showed EF 60 to 65%, normal RV function, severe left atrial enlargement, no significant valve issue.  Lower extremity venous duplex showed no evidence of DVT.  Lexiscan in March 2021 showed a small fixed mid anteroseptal, apical anterior, apex perfusion defect, likely representing artifact given normal wall motion in the area.  Patient was last seen by Dr. Gaynelle Arabian in October 2024 at which time she was doing well.  22-month follow-up was recommended.  Patient was recently admitted to the hospital on 11/19/2023 with fever, weakness, and shortness of breath.  Patient reported a nonproductive cough.  COVID and flu test were negative.  Temperature on arrival was 100.4.  Lab work showed leukocytosis.  CTA of the chest showed reactive lymphadenopathy.  Hemoglobin 8.8.  UA concerning for possible UTI.  Respiratory viral panel came back positive for coronavirus, but not COVID-19.  Patient was placed on ceftriaxone and azithromycin and given gentle hydration.  During the hospitalization, patient developed acute hypoxia, IV fluids stopped.  Echocardiogram obtained on 11/19/2023 showed EF 60 to 65%, grade 3 diastolic dysfunction, mildly dilated left atrium, small pericardial effusion, degenerative mitral valve with trivial MR.  Patient was started on low-dose Lasix and ultimately discharged on 40 mg daily of Lasix.  Since discharge, patient has  been seen by PCP on 11/28/2023.  Blood work obtained on that day showed creatinine has increased from previous 0.52 to 0.81.  Glucose was 400, liver enzyme was mildly elevated.  White blood cell count remains elevated at 12.1.  Urinalysis showed 3+ glucose, 1+ protein and trace red blood cell.  Patient presents today for follow-up.  She is accompanied by her daughter, her daughter says she has not recovered back to her previous baseline after the recent hospitalization.  According to daughter, her 40 mg of oral Lasix has been reduced to 20 mg daily after the recent blood work.  She was given another course of antibiotic followed cytosis.  She appears to be euvolemic on exam.  I recommended repeat basic metabolic panel in 1 to 2 weeks, however patient lives 40 minutes away from our office and wished to get the blood work done at PCPs office.  I will send a message to her PCP to see if they are willing to do it.  If not, we can always order the blood work to be done at [facility].  If creatinine continue to trend up, I will change the Lasix to as needed instead.  She has grade 3 diastolic dysfunction, previous echocardiogram in 2022 indicated indeterminant diastolic parameter.  I suspect this her grade 3 diastolic dysfunction is most likely due to history of high blood pressure.  I will send a message to Dr. Bjorn Pippin to see if he would recommend additional workup to rule out infiltrative heart disease.  Otherwise, patient can follow-up in 6 months.    ROS:   She denies chest pain,  palpitations, dyspnea, pnd, orthopnea. All other systems reviewed and are otherwise negative except as noted above.  Patient has aphasia, this is per her daughter.  Her daughter says the patient is still not back at baseline and is slow to respond.   Studies Reviewed: .        Cardiac Studies & Procedures   ______________________________________________________________________________________________   STRESS  TESTS  MYOCARDIAL PERFUSION IMAGING 11/09/2019  Narrative  <63mm ST depressions with stress  Nuclear stress EF: 60%.  Defect 1: There is a fixed small defect of mild severity present in the mid anteroseptal, apical anterior, and apex location.  This is a low risk study.  The study is normal.  Fixed small defect of mild severity present in the mid anteroseptal, apical anterior, and apex location.  Given normal wall motion in this area, suspect artifact   ECHOCARDIOGRAM  ECHOCARDIOGRAM COMPLETE 11/19/2023  Narrative ECHOCARDIOGRAM REPORT    Patient Name:   ILIYANA CONVEY Date of Exam: 11/19/2023 Medical Rec #:  [id number]           Height:       64.0 in Accession #:    [id number]          Weight:       164.9 lb Date of Birth:  [birth year]           BSA:          1.802 m Patient Age:    82 years            BP:           168/63 mmHg Patient Gender: F                   HR:           40 bpm. Exam Location:  Inpatient  Procedure: 2D Echo, Cardiac Doppler and Color Doppler (Both Spectral and Color Flow Doppler were utilized during procedure).  Indications:    Abnormal EKG  History:        Patient has prior history of Echocardiogram examinations, most recent 02/11/2021. Risk Factors:Hypertension.  Sonographer:    Amy Chionchio Referring Phys: [id number] BELKYS A REGALADO  IMPRESSIONS   1. Left ventricular ejection fraction, by estimation, is 60 to 65%. The left ventricle has normal function. The left ventricle has no regional wall motion abnormalities. There is mild left ventricular hypertrophy. Left ventricular diastolic parameters are consistent with Grade III diastolic dysfunction (restrictive). Elevated left atrial pressure. 2. Right ventricular systolic function is normal. The right ventricular size is normal. Tricuspid regurgitation signal is inadequate for assessing PA pressure. 3. Left atrial size was mildly dilated. 4. A small pericardial effusion is present. 5. The mitral  valve is degenerative. Trivial mitral valve regurgitation. No evidence of mitral stenosis. Moderate mitral annular calcification. 6. The aortic valve is tricuspid. Aortic valve regurgitation is not visualized. No aortic stenosis is present.  FINDINGS Left Ventricle: Left ventricular ejection fraction, by estimation, is 60 to 65%. The left ventricle has normal function. The left ventricle has no regional wall motion abnormalities. The left ventricular internal cavity size was normal in size. There is mild left ventricular hypertrophy. Left ventricular diastolic parameters are consistent with Grade III diastolic dysfunction (restrictive). Elevated left atrial pressure.  Right Ventricle: The right ventricular size is normal. No increase in right ventricular wall thickness. Right ventricular systolic function is normal. Tricuspid regurgitation signal is inadequate for assessing PA pressure.  Left Atrium: Left atrial size was mildly dilated.  Right Atrium: Right atrial size was normal in size.  Pericardium: A small pericardial effusion is present.  Mitral Valve: The mitral valve is degenerative in appearance. Moderate mitral annular calcification. Trivial mitral valve regurgitation. No evidence of mitral valve stenosis. MV peak gradient, 7.6 mmHg. The mean mitral valve gradient is 2.0 mmHg.  Tricuspid Valve: The tricuspid valve is normal in structure. Tricuspid valve regurgitation is trivial.  Aortic Valve: The aortic valve is tricuspid. Aortic valve regurgitation is not visualized. No aortic stenosis is present. Aortic valve mean gradient measures 5.0 mmHg. Aortic valve peak gradient measures 8.9 mmHg. Aortic valve area, by VTI measures 3.03 cm.  Pulmonic Valve: The pulmonic valve was not well visualized. Pulmonic valve regurgitation is not visualized.  Aorta: The aortic root and ascending aorta are structurally normal, with no evidence of dilitation.  IAS/Shunts: The interatrial septum was  not well visualized.   LEFT VENTRICLE PLAX 2D LVIDd:         5.20 cm   Diastology LVIDs:         3.20 cm   LV e' medial:    6.85 cm/s LV PW:         1.10 cm   LV E/e' medial:  18.7 LV IVS:        0.70 cm   LV e' lateral:   4.68 cm/s LVOT diam:     2.00 cm   LV E/e' lateral: 27.4 LV SV:         98 LV SV Index:   54 LVOT Area:     3.14 cm   RIGHT VENTRICLE         IVC TAPSE (M-mode): 1.6 cm  IVC diam: 2.40 cm  LEFT ATRIUM             Index        RIGHT ATRIUM           Index LA Vol (A2C):   59.3 ml 32.90 ml/m  RA Area:     17.20 cm LA Vol (A4C):   56.4 ml 31.29 ml/m  RA Volume:   43.20 ml  23.97 ml/m LA Biplane Vol: 62.9 ml 34.90 ml/m AORTIC VALVE AV Area (Vmax):    2.68 cm AV Area (Vmean):   2.68 cm AV Area (VTI):     3.03 cm AV Vmax:           149.00 cm/s AV Vmean:          100.000 cm/s AV VTI:            0.324 m AV Peak Grad:      8.9 mmHg AV Mean Grad:      5.0 mmHg LVOT Vmax:         127.00 cm/s LVOT Vmean:        85.300 cm/s LVOT VTI:          0.312 m LVOT/AV VTI ratio: 0.96  AORTA Ao Root diam: 2.40 cm Ao Asc diam:  3.00 cm  MITRAL VALVE MV Area (PHT): 4.31 cm     SHUNTS MV Area VTI:   2.62 cm     Systemic VTI:  0.31 m MV Peak grad:  7.6 mmHg     Systemic Diam: 2.00 cm MV Mean grad:  2.0 mmHg MV Vmax:       1.38 m/s MV Vmean:      62.4 cm/s MV Decel Time: 176 msec MV E velocity: 128.00 cm/s MV A velocity: 49.80 cm/s MV E/A ratio:  2.57  Carson Clara MD Electronically signed by Carson Clara MD Signature Date/Time: 11/19/2023/6:35:52 PM    Final          ______________________________________________________________________________________________      Risk Assessment/Calculations:             Physical Exam:   VS:  BP (!) 133/54 (BP Location: Left Arm, Patient Position: Sitting, Cuff Size: Normal)   Pulse (!) 53   Ht 5\' 4"  (1.626 m)   Wt 154 lb 6.4 oz (70 kg)   SpO2 94%   BMI 26.50 kg/m    Wt Readings from  Last 3 Encounters:  12/03/23 154 lb 6.4 oz (70 kg)  11/28/23 153 lb 12.8 oz (69.8 kg)  11/19/23 164 lb 14.5 oz (74.8 kg)    GEN: Well nourished, well developed in no acute distress NECK: No JVD; No carotid bruits CARDIAC: RRR, no murmurs, rubs, gallops RESPIRATORY:  Clear to auscultation without rales, wheezing or rhonchi  ABDOMEN: Soft, non-tender, non-distended EXTREMITIES:  No edema; No deformity   ASSESSMENT AND PLAN: .    Diastolic Heart Failure Grade 3 diastolic dysfunction with mildly dilated left atrium and small pericardial effusion, likely related to hypertension. Echocardiogram shows normal contractile function with EF 60-65%. Diastolic dysfunction due to cardiac stiffness, possibly exacerbated by IV fluid administration. No signs of volume overload currently.  - Continue Lasix 20 mg once daily.  Lasix was recently cut back from 40 mg daily which was the discharge dose. -Recent blood work obtained by PCP showed creatinine trended up from 0.5 to 0.8. - Repeat basic metabolic panel in 1-2 weeks to monitor kidney function. - Limit fluid intake to 32-48 ounces per day. - will discuss with Dr. Alda Amas to see if need further workup for infiltrative heart disease.  Leukocytosis Persistent leukocytosis with WBC count of 12.1. Discussed leukocytosis may be related to recent infection and the necessity of monitoring for signs of infection. -Recently admitted to the hospital with coronavirus, not COVID-19 - Continue current antibiotic regimen. - Monitor for signs of infection or fever.  Aphasia Primary progressive aphasia. Communication is limited; daughter provides history and context.  Hypertension: Blood pressure well-controlled on current therapy  Atrial tachycardia: Well-controlled on metoprolol       Dispo: Follow-up with Dr. Alda Amas in 6 months  Signed, Lurine Imel, [place]

## 2023-12-03 NOTE — Patient Instructions (Signed)
 Medication Instructions:  NO CHANGES *If you need a refill on your cardiac medications before your next appointment, please call your pharmacy*  Lab Work: NO LABS If you have labs (blood work) drawn today and your tests are completely normal, you will receive your results only by: MyChart Message (if you have MyChart) OR A paper copy in the mail If you have any lab test that is abnormal or we need to change your treatment, we will call you to review the results.  Testing/Procedures: NO TESTING  Follow-Up: At [facility], you and your health needs are our priority.  As part of our continuing mission to provide you with exceptional heart care, our providers are all part of one team.  This team includes your primary Cardiologist (physician) and Advanced Practice Providers or APPs (Physician Assistants and Nurse Practitioners) who all work together to provide you with the care you need, when you need it.  Your next appointment:   6 month(s)  Provider:   Wendie Hamburg, MD   Other Instructions   1st Floor: - Lobby - Registration  - Pharmacy  - Lab - Cafe  2nd Floor: - PV Lab - Diagnostic Testing (echo, CT, nuclear med)  3rd Floor: - Vacant  4th Floor: - TCTS (cardiothoracic surgery) - AFib Clinic - Structural Heart Clinic - Vascular Surgery  - Vascular Ultrasound  5th Floor: - HeartCare Cardiology (general and EP) - Clinical Pharmacy for coumadin, hypertension, lipid, weight-loss medications, and med management appointments    Valet parking services will be available as well.

## 2023-12-04 ENCOUNTER — Ambulatory Visit: Payer: Medicare PPO | Admitting: Physician Assistant

## 2023-12-04 ENCOUNTER — Other Ambulatory Visit: Payer: Self-pay | Admitting: Family

## 2023-12-04 ENCOUNTER — Encounter: Payer: Self-pay | Admitting: Physician Assistant

## 2023-12-04 ENCOUNTER — Other Ambulatory Visit (HOSPITAL_COMMUNITY): Payer: Self-pay | Admitting: *Deleted

## 2023-12-04 VITALS — BP 127/47 | HR 65 | Resp 20 | Wt 153.0 lb

## 2023-12-04 DIAGNOSIS — R1310 Dysphagia, unspecified: Secondary | ICD-10-CM | POA: Diagnosis not present

## 2023-12-04 DIAGNOSIS — G3101 Pick's disease: Secondary | ICD-10-CM

## 2023-12-04 DIAGNOSIS — F0280 Dementia in other diseases classified elsewhere without behavioral disturbance: Secondary | ICD-10-CM

## 2023-12-04 DIAGNOSIS — R2681 Unsteadiness on feet: Secondary | ICD-10-CM

## 2023-12-04 DIAGNOSIS — I509 Heart failure, unspecified: Secondary | ICD-10-CM

## 2023-12-04 NOTE — Progress Notes (Signed)
 Assessment/Plan:    Primary progressive aphasia (PPA), nonfluent variant  Kristi Willis is a very pleasant 83 y.o. RH female with a history of HTN, QT prolongation, hypothyroid, depression, vitamin D deficiency, anemia, BPPV, DM with neuropathy seen today in 1 year follow up for primary progressive aphasia (PPA), nonfluent variant.  Aphasia has progressed, essentially she is nonverbal.  She has difficulty processing thoughts information, including using her writing pad.  Decreased hearing may contribute to some of her symptoms.  She is no longer on speech therapy. Unfortunately there is progression of her disease. She is weaker than prior,  Discussed with her daughter the findings. Mood is good.     Follow up in 1 year per daughter's request (instead of 6 months) Recommend good control of her cardiovascular risk factors Continue to control mood as per PCP  Referral to physical therapy for strength and balance Referral to swallow evaluation due to difficulties Recommend 24/7 supervision for safety, HHN    Subjective:    This patient is accompanied in the office by her daughter who supplements the history.  Previous records as well as any outside records available were reviewed prior to todays visit. Patient was last seen on 12/04/2022    Any changes in memory since last visit? " Essentially she is nonverbal, continues to have comprehension difficulties.  She has writing pad to communicate as before, but since the hospitalization she has not been doing so.  She refers to herself as and in third person. "Hard time focusing on things".  repeats oneself?  Endorsed Disoriented when walking into a room?  Daughter says she does fairly well at home, although she may not quite know what is going on    Leaving objects?  May misplace things but not in unusual places.  She is able to participate on her basic chores   Wandering behavior?  Denies, but daughter has placed cameras for safety   Any  personality changes since last visit?  Sometimes she looks at her daughter ant tells her she is tired   Any worsening depression?:  Denies.  Hallucinations or paranoia?  Denies.   Seizures? denies    Any sleep changes?  Sleeps well, from 11pm-9 am.  Denies vivid dreams, REM behavior or sleepwalking   Sleep apnea?   Denies.   Any hygiene concerns? Only when she came from the hospital, not nnowIndependent of bathing and dressing?  Endorsed  Does the patient needs help with medications?  Daughter is in charge   Who is in charge of the finances?  Daughter is in charge     Any changes in appetite? She eats the same thing every day.      Patient have trouble swallowing? She has struggled with swallowing a couple of times, daughter says   Does the patient cook? No Any headaches?   Denies.   Chronic back pain  denies   Ambulates with difficulty? Denies." She has a hard time getting up from a chair"   Recent falls or head injuries? Denies.     Unilateral weakness, numbness or tingling? denies   Any tremors?  Denies   Any anosmia?  Denies   Any incontinence of urine?  Endorsed, wears diapers. Recent UTI Any bowel dysfunction?   Denies      Patient lives by herself, daughter monitors closely.   Does the patient drive?  No longer drives     HPI 06/10/2016 for PPA: This is a 83 yo RH woman with  a history of hypertension, diabetes, neuropathy, who presented for evaluation of word-finding difficulties. She and her daughter started noticing symptoms for the past couple of years, but worsened after her husband passed away last 06/03/2024. She reports that she knows what she wants to say but it does not come out as it should. Her daughter noticed it was hard to have conversation with her. She started to want to be alone and stay at home due to this. She was started on an antidepressant, and reports that symptoms did improve a little, but she still struggles to get out the idea of what she wants to say. She has  good and bad days. She lives alone and denies getting lost driving. Her daughter though reports she falls asleep driving so her sister is with her for long distance driving. She occasionally forgets her medications and occasionally misplaces things. They could not find their Christmas gifts and got them in July. No missed bill payments. No hygiene issues, no difficulties with ADLs. Her daughter feels that the confusion is a little better, but she is still not comfortable talking to other people. She had an MRI brain without contrast done 04/17/16 for these symptoms which I personally reviewed, no acute changes, there was mild diffuse volume loss.   She has neuropathy with burning and numbness in both feet. She has occasional back pain, no falls. She occasionally has problems swallowing meat. She denies any headaches, dizziness, diplopia, dysarthria, neck pain, focal numbness/tingling/weakness, bowel/bladder dysfunction, anosmia, or tremors. No family history of dementia, no history of significant head injuries. She occasionally drinks alcohol.     Initial evaluation for vertigo March 2023 this is a very pleasant 83 year old right-handed woman with extensive medical history, including PPA nonfluent variant, seen in the past for same, at this point, communicating via pad, and referring herself to third person "Kristi Willis", stable from the cognitive standpoint, not seen since 2021.  She had speech therapy with home exercises.  She has a history of asymmetric hearing loss, followed by ENT, and schedule a visit soon. She is seen today for evaluation of vertigo.  In review, she was at the dentist office during a routine visit, developing after this visit dizziness, and felt that "everything was spinning and moving ".  This vertigo sporadically shows up.  Symptoms are ameliorated by taking meclizine.  She experiences this about 2-3 times a week, and it can happen even in the middle of the night.  She denies any headaches or  vision changes, although if she moves her eyes rapidly, will get vertigo according to her daughter.  She has a corneal procedure soon.  There is no arm weakness or swallowing issues.  Ambulates "a little off balance and uses a walker for stability ".  She had recent cellulitis in the right leg, treated with antibiotics.  No recent long distance trips, no new areas numbness or tingling (she has diabetic PN).  She may have a history of seasonal allergies, and reports that her right nostril "drips water and so that is her right eye always with tears"-daughter says.  Due to the symptoms, she presented to the ED, a CT was performed on 09/26/2021, with negative acute findings.  Other work-up including EKG, UA, CBC, C met was negative as well.  No nystagmus was present during that exam or lateral gaze.  Meclizine 25 mg was given with good relief. As mentioned above, her aphasia is at baseline perhaps with mild worsening of her speech, she now has  to "write everything "or names of the stores had to be changed such as [employer] is "[place] "., with no other focal deficits.  She still is independent, lives by herself, pays her own bills without missing any payments, and manages her own medications.  PREVIOUS MEDICATIONS:   CURRENT MEDICATIONS:  Outpatient Encounter Medications as of 12/04/2023  Medication Sig   Accu-Chek Softclix Lancets lancets CHECK BLOOD SUGAR ONCE DAILY OR AS DIRECTED Dx E11.40   acyclovir ointment (ZOVIRAX) 5 % APPLY TO THE AFFECTED AREA AROUND MOUTH EVERY 3 HOURS AS DIRECTED UNTIL CLEAR   albuterol (VENTOLIN HFA) 108 (90 Base) MCG/ACT inhaler INHALE TWO PUFFS EVERY 6 HOURS AS NEEDED FOR WHEEZING OR SHORTNESS OF BREATH   amLODipine (NORVASC) 5 MG tablet TAKE ONE TABLET BY MOUTH DAILY   aspirin 81 MG chewable tablet Chew 81 mg by mouth at bedtime.   azelastine (ASTELIN) 0.1 % nasal spray Place 2 sprays into both nostrils 2 (two) times daily.   budesonide (PULMICORT) 0.25 MG/2ML nebulizer  solution Take 2 mLs (0.25 mg total) by nebulization 2 (two) times daily.   cetirizine (ZYRTEC) 10 MG tablet TAKE ONE TABLET DAILY   Coenzyme Q10 (COQ10 PO) Take 1 tablet by mouth daily.   cyanocobalamin (VITAMIN B12) 500 MCG tablet Take 1 tablet (500 mcg total) by mouth daily.   doxazosin (CARDURA) 8 MG tablet TAKE (1) TABLET DAILY FOR HIGH BLOOD PRESSURE.   doxycycline (VIBRA-TABS) 100 MG tablet Take 1 tablet (100 mg total) by mouth 2 (two) times daily.   EPIPEN 2-PAK 0.3 MG/0.3ML SOAJ injection Inject 0.3 mg into the muscle as needed for anaphylaxis. Reported on 01/24/2016   escitalopram (LEXAPRO) 20 MG tablet TAKE ONE TABLET BY MOUTH DAILY   fluticasone (FLONASE) 50 MCG/ACT nasal spray Place 1 spray into both nostrils daily.   furosemide (LASIX) 20 MG tablet Take 1 tablet (20 mg total) by mouth daily.   glucose blood (ACCU-CHEK AVIVA PLUS) test strip CHECK BLOOD SUGAR ONCE DAILY OR AS DIRECTED Dx E11.40   guaiFENesin (MUCINEX) 600 MG 12 hr tablet Take 2 tablets (1,200 mg total) by mouth 2 (two) times daily.   ipratropium-albuterol (DUONEB) 0.5-2.5 (3) MG/3ML SOLN Take 3 mLs by nebulization every 6 (six) hours.   JANUVIA 50 MG tablet TAKE ONE TABLET BY MOUTH DAILY   ketoconazole (NIZORAL) 2 % cream Apply 1 application topically daily.   levothyroxine (SYNTHROID) 75 MCG tablet TAKE ONE TABLET DAILY BEFORE BREAKFAST   lisinopril (ZESTRIL) 40 MG tablet TAKE ONE TABLET DAILY   metFORMIN (GLUCOPHAGE) 1000 MG tablet TAKE  (1)  TABLET TWICE A DAY WITH MEALS (BREAKFAST AND SUPPER)   metoprolol succinate (TOPROL-XL) 100 MG 24 hr tablet TAKE ONE TABLET DAILY   montelukast (SINGULAIR) 10 MG tablet TAKE ONE TABLET AT BEDTIME   furosemide (LASIX) 40 MG tablet Take 1 tablet (40 mg total) by mouth daily for 7 days.   rosuvastatin (CRESTOR) 20 MG tablet Take 1 tablet (20 mg total) by mouth at bedtime.   No facility-administered encounter medications on file as of 12/04/2023.       03/06/2022   12:42 PM  09/17/2017    4:00 PM 06/05/2016   11:22 AM  MMSE - Mini Mental State Exam  Not completed: Unable to complete;Refused    Orientation to time  5 5  Orientation to Place  5 5  Registration  3 3  Attention/ Calculation  5 5  Recall  3 3  Language- name 2 objects  2 2  Language- repeat  0 0  Language- follow 3 step command  3 3  Language- read & follow direction  1 1  Write a sentence  1 1  Copy design  1 1  Total score  29 29      06/10/2016    1:00 PM  Montreal Cognitive Assessment   Visuospatial/ Executive (0/5) 5  Naming (0/3) 3  Attention: Read list of digits (0/2) 1  Attention: Read list of letters (0/1) 0  Attention: Serial 7 subtraction starting at 100 (0/3) 1  Language: Repeat phrase (0/2) 1  Language : Fluency (0/1) 0  Abstraction (0/2) 2  Delayed Recall (0/5) 5  Orientation (0/6) 5  Total 23  Adjusted Score (based on education) 24    Objective:     PHYSICAL EXAMINATION:    VITALS:   Vitals:   12/04/23 1239  BP: (!) 127/47  Pulse: 65  Resp: 20  SpO2: 96%  Weight: 153 lb (69.4 kg)    GEN:  The patient appears stated age and is in NAD. HEENT:  Normocephalic, atraumatic.   Neurological examination:  General: NAD, well-groomed, appears stated age. Orientation: The patient is alert. Oriented to person, unable to tell place and date due to PPA Cranial nerves: There is good facial symmetry.The speech is nonfluent.  She has aphasia. Fund of knowledge is appropriate. Recent and remote memory are impaired. Attention and concentration are normal.  Unable to name objects and repeat phrases.  Hearing is decreased to conversational tone.   Sensation: Sensation is intact to light touch throughout Motor: Strength is at least antigravity x4. DTR's 2/4 in UE/LE     Movement examination: Tone: There is normal tone in the UE/LE Abnormal movements:  no tremor.  No myoclonus.  No asterixis.   Coordination:  There is some decremation with RAM's. Abnormal  finger to  nose. Gait and Station: The patient has some difficulty arising out of a deep-seated chair without the use of the hands. The patient's stride length is short.  Gait is cautious and narrow.    Thank you for allowing us  the opportunity to participate in the care of this nice patient. Please do not hesitate to contact us  for any questions or concerns.   Total time spent on today's visit was 40 minutes dedicated to this patient today, preparing to see patient, examining the patient, ordering tests and/or medications and counseling the patient, documenting clinical information in the EHR or other health record, independently interpreting results and communicating results to the patient/family, discussing treatment and goals, answering patient's questions and coordinating care.  Cc:  Yevette Hem, FNP  Tex Filbert 12/04/2023 3:37 PM

## 2023-12-04 NOTE — Patient Instructions (Signed)
 Follow in 6 months Continue with your medicines  Continue Lexapro 20 mg daily Recommend swallow evaluation PT for strength and balance

## 2023-12-11 ENCOUNTER — Other Ambulatory Visit: Payer: Self-pay | Admitting: Family

## 2023-12-11 DIAGNOSIS — E1142 Type 2 diabetes mellitus with diabetic polyneuropathy: Secondary | ICD-10-CM

## 2023-12-11 DIAGNOSIS — E1169 Type 2 diabetes mellitus with other specified complication: Secondary | ICD-10-CM

## 2023-12-11 DIAGNOSIS — I10 Essential (primary) hypertension: Secondary | ICD-10-CM

## 2023-12-11 DIAGNOSIS — F331 Major depressive disorder, recurrent, moderate: Secondary | ICD-10-CM

## 2023-12-11 DIAGNOSIS — J4520 Mild intermittent asthma, uncomplicated: Secondary | ICD-10-CM

## 2023-12-11 DIAGNOSIS — F411 Generalized anxiety disorder: Secondary | ICD-10-CM

## 2023-12-15 ENCOUNTER — Other Ambulatory Visit: Payer: Self-pay

## 2023-12-15 ENCOUNTER — Ambulatory Visit: Attending: Physician Assistant | Admitting: Physical Therapy

## 2023-12-15 DIAGNOSIS — R2681 Unsteadiness on feet: Secondary | ICD-10-CM | POA: Diagnosis not present

## 2023-12-15 NOTE — Therapy (Signed)
 OUTPATIENT PHYSICAL THERAPY LOWER EXTREMITY EVALUATION   Patient Name: Kristi Willis MRN: [id number] DOB:26-Oct-1940, 83 y.o., female Today's Date: 12/15/2023  END OF SESSION:  PT End of Session - 12/15/23 1324     Visit Number 1    Number of Visits 12    PT Start Time 0105    PT Stop Time 0132    PT Time Calculation (min) 27 min    Activity Tolerance Patient tolerated treatment well             Past Medical History:  Diagnosis Date   Allergy     Asthma    Cancer (HCC)    skin   Diabetes mellitus    GERD (gastroesophageal reflux disease)    hiatal hernia   Hypercholesteremia    Hypertension    Neuromuscular disorder (HCC)    DM neuropathy   Neuropathy    SVT (supraventricular tachycardia) (HCC)    Past Surgical History:  Procedure Laterality Date   BREAST BIOPSY     left breast biopsy   LUMBAR LAMINECTOMY  08/10   SKIN CANCER EXCISION  2005   SPINE SURGERY     lumbar laminectomy   Patient Active Problem List   Diagnosis Date Noted   Weakness 11/19/2023   Hypoxia 11/19/2023   Allergic rhinitis 03/05/2022   Pulmonary congestion 02/11/2021   Aortic atherosclerosis (HCC) 02/11/2021   Hypo-osmolar hyponatremia 02/10/2021   Primary progressive aphasia (HCC) 03/23/2020   Pain in left shoulder 11/03/2019   Acute metabolic encephalopathy 08/05/2019   Iron deficiency anemia 08/05/2019   QT prolongation 08/05/2019   Hypothyroidism 02/12/2019   Speech articulation disorder 09/01/2018   Essential hypertension 10/03/2017   GAD (generalized anxiety disorder) 07/03/2017   Diabetes mellitus (HCC) 09/27/2016   Depression 03/29/2016   Insomnia 07/18/2015   Vitamin D  deficiency 10/11/2013   Right carotid bruit 03/02/2013   Degenerative disc disease, lumbar 11/27/2012   Asthma 11/13/2010   Hyperlipidemia associated with type 2 diabetes mellitus (HCC) 11/13/2010   Cancer of skin 11/13/2010   Diabetic neuropathy (HCC) 11/13/2010   Wide-complex  tachycardia==Atrial Tachycardia 11/13/2010    REFERRING PROVIDER: Tommas Fragmin  REFERRING DIAG: Gait instability.  THERAPY DIAG:  Unsteadiness on feet  Rationale for Evaluation and Treatment: Rehabilitation  ONSET DATE: Ongoing.   SUBJECTIVE:   SUBJECTIVE STATEMENT: The patient presents to clinic with with her daughter, Howell Macintosh.  The patient has primary progressive aphasia but follows commands very well.  Her daughter would like to see get stronger and improve mobility.  The patient is able to live alone and her daughter states she does well.  She is closely monitored by family and has a life alert.  Howell Macintosh states she takes her shopping.    PERTINENT HISTORY: Please see above.   PAIN:  Are you having pain? No  PRECAUTIONS: Fall.  Please be with patient at all times.      WEIGHT BEARING RESTRICTIONS: No  FALLS:  Has patient fallen in last 6 months? Yes. Number of falls 2.  LIVING ENVIRONMENT: Lives with: lives alone Lives in: House/apartment Has following equipment at home: None  OCCUPATION: Retired.    PLOF: Independent with basic ADLs  PATIENT GOALS: Improve mobility.   OBJECTIVE:  Note: Objective measures were completed at Evaluation unless otherwise noted.  POSTURE: rounded shoulders and forward head  LOWER EXTREMITY ROM:  WFL.  LOWER EXTREMITY MMT:  Patient able to provide a normal strength grade via manual muscle testing to bilateral hip and knees.  FUNCTIONAL TESTS:  5 times sit to stand: 22 seconds. Timed up and go (TUG): 20 seconds  GAIT: The patient's gait is slow and purposeful with a wide base of support.  Assessed stairs with ascent and descent performed with a railing and CGA.  Decrease step and stride length.                                                                                                                                 TREATMENT DATE:     PATIENT EDUCATION:  Education details:  Person educated:  International aid/development worker:   Education comprehension:   HOME EXERCISE PROGRAM:   ASSESSMENT:  CLINICAL IMPRESSION: The patient presents to OPPT with her daughter, Howell Macintosh.  Patient walks with a slow and purposeful gait pattern with a wide base of support anda decrease in stride and step length.  Supervised gait is highly recommended.  Howell Macintosh states she has used a cane but it tends to be more of a hindrance to her.  Her TUG was performed in 20 seconds and 5 time sit to stand in 22 seconds.  She was able to provide a normal strength grade via manual muscle testing for bilateral hip and knee strength.  Patient will benefit from skilled physical therapy intervention to address deficits.  OBJECTIVE IMPAIRMENTS: Abnormal gait and decreased activity tolerance.   ACTIVITY LIMITATIONS: carrying, lifting, and locomotion level  PARTICIPATION LIMITATIONS: meal prep, cleaning, laundry, and yard work  PERSONAL FACTORS: 1-2 comorbidities: diabetic neuropathy, primary progressive aphasia  are also affecting patient's functional outcome.   REHAB POTENTIAL: Good minus  CLINICAL DECISION MAKING: Evolving/moderate complexity  EVALUATION COMPLEXITY: Moderate   GOALS:  LONG TERM GOALS: Target date: 01/26/24  Ind with a HEP.  Goal status: INITIAL  2.  Improve TUG by 4-5 seconds.  Goal status: INITIAL  3.  Improve 5 time sit to stand by 5 seconds.  Goal status: INITIAL  4.  Walk 500 feet with supervision only.  Goal status: INITIAL    PLAN:  PT FREQUENCY: 2x/week  PT DURATION: 6 weeks  PLANNED INTERVENTIONS: 97110-Therapeutic exercises, 97530- Therapeutic activity, [id number]- Neuromuscular re-education, 97535- Self Care, and 16109- Manual therapy  PLAN FOR NEXT SESSION: Nustep, gait and balance activities.  Strengthening exercises.     Felicitas Sine, [place], PT 12/15/2023, 1:54 PM

## 2023-12-17 ENCOUNTER — Ambulatory Visit (HOSPITAL_COMMUNITY)
Admission: RE | Admit: 2023-12-17 | Discharge: 2023-12-17 | Disposition: A | Discharge status: Home / Self Care | Source: Ambulatory Visit | Attending: Family | Admitting: Family

## 2023-12-17 DIAGNOSIS — R059 Cough, unspecified: Secondary | ICD-10-CM | POA: Diagnosis not present

## 2023-12-17 DIAGNOSIS — R933 Abnormal findings on diagnostic imaging of other parts of digestive tract: Secondary | ICD-10-CM | POA: Diagnosis not present

## 2023-12-17 DIAGNOSIS — R1310 Dysphagia, unspecified: Secondary | ICD-10-CM | POA: Diagnosis not present

## 2023-12-17 DIAGNOSIS — R1314 Dysphagia, pharyngoesophageal phase: Secondary | ICD-10-CM | POA: Diagnosis not present

## 2023-12-17 NOTE — Progress Notes (Signed)
 12/17/23 0900  SLP Visit Information  SLP Received On 12/17/23  General Information  HPI TELLA HOPFENSPERGER arrives for an OP MBS due to complaint of difficulty swallowing at Neurology visit. Daughter reports coughing and regurgitation with solids. History of Primary Progressive Aphasia, nonfluent variant. Per neurology notes: "Aphasia has progressed, essentially she is nonverbal.  She has difficulty processing thoughts information, including using her writing pad.  Decreased hearing may contribute to some of her symptoms.  She is no longer on speech therapy. Unfortunately there is progression of her disease. She is weaker than prior." Other PMH includes HTN, QT prolongation, hypothyroid, depression, vitamin D  deficiency, anemia, BPPV, DM with neuropathy.  Caregiver present Yes  Diet Prior to this Study Regular;Thin liquids (Level 0)  Temperature  Normal  Respiratory Status WFL  Supplemental O2 None (Room air)  History of Recent Intubation No  Behavior/Cognition Alert;Cooperative;Pleasant mood  Self-Feeding Abilities Able to self-feed  Baseline vocal quality/speech Normal  Volitional Cough Unable to elicit  Anatomy Prominent cricopharyngeus  Boluses Administered  Boluses Administered Thin liquids (Level 0);Mildly thick liquids (Level 2, nectar thick);Moderately thick liquids (Level 3, honey thick);Puree;Solid  Oral Impairment Domain  Lip Closure No labial escape  Tongue control during bolus hold Not tested  Bolus transport/lingual motion Delayed initiation of tongue motion (oral holding);Slow tongue motion;Repetitive/disorganized tongue motion  Oral residue Complete oral clearance  Initiation of pharyngeal swallow  Posterior angle of the ramus  Pharyngeal Impairment Domain  Soft palate elevation No bolus between soft palate (SP)/pharyngeal wall (PW)  Laryngeal elevation Complete superior movement of thyroid  cartilage with complete approximation of arytenoids to epiglottic petiole   Anterior hyoid excursion Complete anterior movement  Epiglottic movement Complete inversion  Laryngeal vestibule closure Complete, no air/contrast in laryngeal vestibule  Pharyngeal stripping wave  Present - complete  Pharyngeal contraction (A/P view only) N/A  Pharyngoesophageal segment opening Partial distention/partial duration, partial obstruction of flow  Tongue base retraction No contrast between tongue base and posterior pharyngeal wall (PPW)  Pharyngeal residue Trace residue within or on pharyngeal structures  Location of pharyngeal residue Valleculae  Penetration/Aspiration Scale Score  1.  Material does not enter airway Mildly thick liquids (Level 2, nectar thick);Moderately thick liquids (Level 3, honey thick);Puree;Solid;Pill;Thin liquids (Level 0)  2.  Material enters airway, remains ABOVE vocal cords then ejected out Thin liquids (Level 0)  Clinical Impression  Clinical Impression Pt followed gestures and context to follow simple instructions, but was not asked to complete oral hold given receptive language impairment. Pt observed to have a moderate oral dysphagia with lingual rocking and holding behavior with solids and liquids. Pt seemed to have less automaticity for posterior transit. She often packed purees in the vallecula and took another bite before initiating a swallow on the second bolus. This occurred with solids as well. SLP placed food on a table to observe pts self feeding rate. Pt masticated a cracker, left it on the BOT and quickly picked up another cracker. Ultimately she had twice her original bolus to swallow when she finally initiated. This behavior, together with a prominent PES/mild CP bar, could account for reports of pt choking or needing to regurgitate during meals. Pt did not have any obstruction of bolus flow on this exam, but textures are limited to soft foods. Suspect that training pt in a slower rate, swallowing before taking the next bite and following bites  with sips could be beneficial strategies. These can be difficult for a pt to habitualize; suggest f/u with  an SLP for feeding/swallowing for training. Could also consider f/u with ENT to discuss options to treat PES if warranted.  SLP Visit Diagnosis Dysphagia, unspecified (R13.10);Dysphagia, pharyngoesophageal phase (R13.14)  Factors that may increase risk of adverse event in presence of aspiration Roderick Civatte & Jessy Morocco 2021)  (aphasia)  Exam Limitations Other (comment) (unable to follow complex sommands)  Swallowing Evaluation Recommendations  Recommendations PO diet  PO Diet Recommendation Dysphagia 3 (Mechanical soft);Dysphagia 2 (Finely chopped);Thin liquids (Level 0)  Liquid Administration via Cup;Straw  Medication Administration Whole meds with liquid  Supervision Intermittent supervision/cueing for swallowing strategies  Swallowing strategies   Slow rate;Small bites/sips;Check for pocketing or oral holding;Follow solids with liquids  Postural changes Stay upright 30-60 min after meals  Treatment Plan  Treatment recommendations Therapy as outlined in treatment plan below  Follow-up recommendations Outpatient SLP  Goal Planning  Prognosis for improved oropharyngeal function Fair  Barriers to Reach Goals Language deficits  Consulted and agree with results and recommendations Family member/caregiver  SLP Time Calculation  SLP Start Time (ACUTE ONLY) 1110  SLP Stop Time (ACUTE ONLY) 1157  SLP Time Calculation (min) (ACUTE ONLY) 47 min  SLP Evaluations  $ SLP Speech Visit 1 Visit  SLP Evaluations  $Outpatient MBS Swallow 1 Procedure  $Swallowing Treatment 1 Procedure

## 2023-12-22 ENCOUNTER — Ambulatory Visit: Attending: Physician Assistant

## 2023-12-22 DIAGNOSIS — R2681 Unsteadiness on feet: Secondary | ICD-10-CM | POA: Diagnosis not present

## 2023-12-22 NOTE — Therapy (Signed)
 OUTPATIENT PHYSICAL THERAPY LOWER EXTREMITY TREATMENT   Patient Name: Kristi Willis MRN: [id number] DOB:1940/12/14, 83 y.o., female Today's Date: 12/22/2023  END OF SESSION:  PT End of Session - 12/22/23 1312     Visit Number 2    Number of Visits 12    PT Start Time 1308   Patient arrived late to her appointment.   PT Stop Time 1345    PT Time Calculation (min) 37 min    Activity Tolerance Patient tolerated treatment well    Behavior During Therapy WFL for tasks assessed/performed              Past Medical History:  Diagnosis Date   Allergy     Asthma    Cancer (HCC)    skin   Diabetes mellitus    GERD (gastroesophageal reflux disease)    hiatal hernia   Hypercholesteremia    Hypertension    Neuromuscular disorder (HCC)    DM neuropathy   Neuropathy    SVT (supraventricular tachycardia) (HCC)    Past Surgical History:  Procedure Laterality Date   BREAST BIOPSY     left breast biopsy   LUMBAR LAMINECTOMY  08/10   SKIN CANCER EXCISION  2005   SPINE SURGERY     lumbar laminectomy   Patient Active Problem List   Diagnosis Date Noted   Weakness 11/19/2023   Hypoxia 11/19/2023   Allergic rhinitis 03/05/2022   Pulmonary congestion 02/11/2021   Aortic atherosclerosis (HCC) 02/11/2021   Hypo-osmolar hyponatremia 02/10/2021   Primary progressive aphasia (HCC) 03/23/2020   Pain in left shoulder 11/03/2019   Acute metabolic encephalopathy 08/05/2019   Iron deficiency anemia 08/05/2019   QT prolongation 08/05/2019   Hypothyroidism 02/12/2019   Speech articulation disorder 09/01/2018   Essential hypertension 10/03/2017   GAD (generalized anxiety disorder) 07/03/2017   Diabetes mellitus (HCC) 09/27/2016   Depression 03/29/2016   Insomnia 07/18/2015   Vitamin D  deficiency 10/11/2013   Right carotid bruit 03/02/2013   Degenerative disc disease, lumbar 11/27/2012   Asthma 11/13/2010   Hyperlipidemia associated with type 2 diabetes mellitus (HCC) 11/13/2010    Cancer of skin 11/13/2010   Diabetic neuropathy (HCC) 11/13/2010   Wide-complex tachycardia==Atrial Tachycardia 11/13/2010    REFERRING PROVIDER: Tommas Fragmin  REFERRING DIAG: Gait instability.  THERAPY DIAG:  Unsteadiness on feet  Rationale for Evaluation and Treatment: Rehabilitation  ONSET DATE: Ongoing.   SUBJECTIVE:   SUBJECTIVE STATEMENT: Subjective report unable to be obtained.   PERTINENT HISTORY: Please see above.   PAIN:  Are you having pain? No  PRECAUTIONS: Fall.  Please be with patient at all times.      WEIGHT BEARING RESTRICTIONS: No  FALLS:  Has patient fallen in last 6 months? Yes. Number of falls 2.  LIVING ENVIRONMENT: Lives with: lives alone Lives in: House/apartment Has following equipment at home: None  OCCUPATION: Retired.    PLOF: Independent with basic ADLs  PATIENT GOALS: Improve mobility.   OBJECTIVE:  Note: Objective measures were completed at Evaluation unless otherwise noted.  POSTURE: rounded shoulders and forward head  LOWER EXTREMITY ROM:  WFL.  LOWER EXTREMITY MMT:  Patient able to provide a normal strength grade via manual muscle testing to bilateral hip and knees.  FUNCTIONAL TESTS:  5 times sit to stand: 22 seconds. Timed up and go (TUG): 20 seconds  GAIT: The patient's gait is slow and purposeful with a wide base of support.  Assessed stairs with ascent and descent performed with a railing and  CGA.  Decrease step and stride length.                                                                                                                                 TREATMENT DATE:                                     12/22/23 EXERCISE LOG  Exercise Repetitions and Resistance Comments  Nustep  L4 x 18 minutes    LAQ  2# x 20 reps each  Alternating LE   Seated marching  2# x 15 reps each  Alternating LE  Seated heel/toe raised  2# x 30 reps   Seated hip ADD isometric  2.5 minutes w/ 10 second hold   Sit to  stand  10 reps  Without UE support   Blank cell = exercise not performed today   PATIENT EDUCATION:  Education details:  Person educated:  International aid/development worker:  Education comprehension:   HOME EXERCISE PROGRAM:   ASSESSMENT:  CLINICAL IMPRESSION: Patient was introduced to multiple new interventions for improved lower extremity muscular strength to maximize her functional mobility. She required demonstrate for all of today's new interventions for proper exercise performance. She exhibited a moderate increase in lower extremity fatigue as evidenced by her deteriorating arc of motion as she completed today's interventions. She exhibited no significant change in her gait pattern upon the conclusion of treatment. Recommend that she continue with skilled physical therapy to address her remaining impairments to maximize her safety and functional mobility.   OBJECTIVE IMPAIRMENTS: Abnormal gait and decreased activity tolerance.   ACTIVITY LIMITATIONS: carrying, lifting, and locomotion level  PARTICIPATION LIMITATIONS: meal prep, cleaning, laundry, and yard work  PERSONAL FACTORS: 1-2 comorbidities: diabetic neuropathy, primary progressive aphasia  are also affecting patient's functional outcome.   REHAB POTENTIAL: Good minus  CLINICAL DECISION MAKING: Evolving/moderate complexity  EVALUATION COMPLEXITY: Moderate   GOALS:  LONG TERM GOALS: Target date: 01/26/24  Ind with a HEP.  Goal status: INITIAL  2.  Improve TUG by 4-5 seconds.  Goal status: INITIAL  3.  Improve 5 time sit to stand by 5 seconds.  Goal status: INITIAL  4.  Walk 500 feet with supervision only.  Goal status: INITIAL    PLAN:  PT FREQUENCY: 2x/week  PT DURATION: 6 weeks  PLANNED INTERVENTIONS: 97110-Therapeutic exercises, 97530- Therapeutic activity, [id number]- Neuromuscular re-education, 97535- Self Care, and 40981- Manual therapy  PLAN FOR NEXT SESSION: Nustep, gait and balance activities.  Strengthening  exercises.     Lane Pinon, PT 12/22/2023, 5:25 PM

## 2023-12-24 ENCOUNTER — Ambulatory Visit

## 2023-12-24 DIAGNOSIS — R2681 Unsteadiness on feet: Secondary | ICD-10-CM | POA: Diagnosis not present

## 2023-12-24 NOTE — Therapy (Signed)
 OUTPATIENT PHYSICAL THERAPY LOWER EXTREMITY TREATMENT   Patient Name: Kristi Willis MRN: [id number] DOB:22-Apr-1941, 83 y.o., female Today's Date: 12/24/2023  END OF SESSION:  PT End of Session - 12/24/23 1334     Visit Number 3    Number of Visits 12    PT Start Time 1301    PT Stop Time 1336    PT Time Calculation (min) 35 min    Activity Tolerance Patient tolerated treatment well    Behavior During Therapy WFL for tasks assessed/performed               Past Medical History:  Diagnosis Date   Allergy     Asthma    Cancer (HCC)    skin   Diabetes mellitus    GERD (gastroesophageal reflux disease)    hiatal hernia   Hypercholesteremia    Hypertension    Neuromuscular disorder (HCC)    DM neuropathy   Neuropathy    SVT (supraventricular tachycardia) (HCC)    Past Surgical History:  Procedure Laterality Date   BREAST BIOPSY     left breast biopsy   LUMBAR LAMINECTOMY  08/10   SKIN CANCER EXCISION  2005   SPINE SURGERY     lumbar laminectomy   Patient Active Problem List   Diagnosis Date Noted   Weakness 11/19/2023   Hypoxia 11/19/2023   Allergic rhinitis 03/05/2022   Pulmonary congestion 02/11/2021   Aortic atherosclerosis (HCC) 02/11/2021   Hypo-osmolar hyponatremia 02/10/2021   Primary progressive aphasia (HCC) 03/23/2020   Pain in left shoulder 11/03/2019   Acute metabolic encephalopathy 08/05/2019   Iron deficiency anemia 08/05/2019   QT prolongation 08/05/2019   Hypothyroidism 02/12/2019   Speech articulation disorder 09/01/2018   Essential hypertension 10/03/2017   GAD (generalized anxiety disorder) 07/03/2017   Diabetes mellitus (HCC) 09/27/2016   Depression 03/29/2016   Insomnia 07/18/2015   Vitamin D  deficiency 10/11/2013   Right carotid bruit 03/02/2013   Degenerative disc disease, lumbar 11/27/2012   Asthma 11/13/2010   Hyperlipidemia associated with type 2 diabetes mellitus (HCC) 11/13/2010   Cancer of skin 11/13/2010    Diabetic neuropathy (HCC) 11/13/2010   Wide-complex tachycardia==Atrial Tachycardia 11/13/2010    REFERRING PROVIDER: Tommas Fragmin  REFERRING DIAG: Gait instability.  THERAPY DIAG:  Unsteadiness on feet  Rationale for Evaluation and Treatment: Rehabilitation  ONSET DATE: Ongoing.   SUBJECTIVE:   SUBJECTIVE STATEMENT: Subjective report unable to be obtained secondary to aphasia.   PERTINENT HISTORY: Please see above.   PAIN:  Are you having pain? No  PRECAUTIONS: Fall.  Please be with patient at all times.    WEIGHT BEARING RESTRICTIONS: No  FALLS:  Has patient fallen in last 6 months? Yes. Number of falls 2.  LIVING ENVIRONMENT: Lives with: lives alone Lives in: House/apartment Has following equipment at home: None  OCCUPATION: Retired.    PLOF: Independent with basic ADLs  PATIENT GOALS: Improve mobility.   OBJECTIVE:  Note: Objective measures were completed at Evaluation unless otherwise noted.  POSTURE: rounded shoulders and forward head  LOWER EXTREMITY ROM:  WFL.  LOWER EXTREMITY MMT:  Patient able to provide a normal strength grade via manual muscle testing to bilateral hip and knees.  FUNCTIONAL TESTS:  5 times sit to stand: 22 seconds. Timed up and go (TUG): 20 seconds  GAIT: The patient's gait is slow and purposeful with a wide base of support.  Assessed stairs with ascent and descent performed with a railing and CGA.  Decrease step and  stride length.                                                                                                                                 TREATMENT DATE:                                     12/24/23 EXERCISE LOG  Exercise Repetitions and Resistance Comments  Nustep  L4 x 20 minutes   Seated heel/toe raises  2 minutes   Seated HS curls  Red t-band x 3 x 10 reps each    Seated clams  Red t-band x 30 reps    Seated hip ADD isometric  15 reps w/ 5 second hold   Gait training For increased step length     Blank cell = exercise not performed today                                    12/22/23 EXERCISE LOG  Exercise Repetitions and Resistance Comments  Nustep  L4 x 18 minutes    LAQ  2# x 20 reps each  Alternating LE   Seated marching  2# x 15 reps each  Alternating LE  Seated heel/toe raised  2# x 30 reps   Seated hip ADD isometric  2.5 minutes w/ 10 second hold   Sit to stand  10 reps  Without UE support   Blank cell = exercise not performed today   PATIENT EDUCATION:  Education details:  Person educated:  International aid/development worker:  Education comprehension:   HOME EXERCISE PROGRAM:   ASSESSMENT:  CLINICAL IMPRESSION: Patient was progressed with seated hamstring curls and other familiar interventions. She required demonstrate with all of today's interventions for proper exercise performance. She exhibited increased fatigue with today's interventions. Recommend that she continue with skilled physical therapy to address her remaining impairments to maximize her safety and functional mobility.   OBJECTIVE IMPAIRMENTS: Abnormal gait and decreased activity tolerance.   ACTIVITY LIMITATIONS: carrying, lifting, and locomotion level  PARTICIPATION LIMITATIONS: meal prep, cleaning, laundry, and yard work  PERSONAL FACTORS: 1-2 comorbidities: diabetic neuropathy, primary progressive aphasia  are also affecting patient's functional outcome.   REHAB POTENTIAL: Good minus  CLINICAL DECISION MAKING: Evolving/moderate complexity  EVALUATION COMPLEXITY: Moderate   GOALS:  LONG TERM GOALS: Target date: 01/26/24  Ind with a HEP.  Goal status: INITIAL  2.  Improve TUG by 4-5 seconds.  Goal status: INITIAL  3.  Improve 5 time sit to stand by 5 seconds.  Goal status: INITIAL  4.  Walk 500 feet with supervision only.  Goal status: INITIAL    PLAN:  PT FREQUENCY: 2x/week  PT DURATION: 6 weeks  PLANNED INTERVENTIONS: 97110-Therapeutic exercises, 97530- Therapeutic activity, [id number]-  Neuromuscular re-education, 97535- Self Care, and 16109- Manual  therapy  PLAN FOR NEXT SESSION: Nustep, gait and balance activities.  Strengthening exercises.     Lane Pinon, PT 12/24/2023, 2:11 PM

## 2023-12-25 DIAGNOSIS — B351 Tinea unguium: Secondary | ICD-10-CM | POA: Diagnosis not present

## 2023-12-25 DIAGNOSIS — M79675 Pain in left toe(s): Secondary | ICD-10-CM | POA: Diagnosis not present

## 2023-12-25 DIAGNOSIS — L84 Corns and callosities: Secondary | ICD-10-CM | POA: Diagnosis not present

## 2023-12-25 DIAGNOSIS — M79674 Pain in right toe(s): Secondary | ICD-10-CM | POA: Diagnosis not present

## 2023-12-25 DIAGNOSIS — E1142 Type 2 diabetes mellitus with diabetic polyneuropathy: Secondary | ICD-10-CM | POA: Diagnosis not present

## 2023-12-26 ENCOUNTER — Telehealth (INDEPENDENT_AMBULATORY_CARE_PROVIDER_SITE_OTHER): Admitting: Family Medicine

## 2023-12-26 ENCOUNTER — Ambulatory Visit: Payer: Self-pay

## 2023-12-26 DIAGNOSIS — W57XXXA Bitten or stung by nonvenomous insect and other nonvenomous arthropods, initial encounter: Secondary | ICD-10-CM | POA: Diagnosis not present

## 2023-12-26 DIAGNOSIS — J301 Allergic rhinitis due to pollen: Secondary | ICD-10-CM | POA: Diagnosis not present

## 2023-12-26 DIAGNOSIS — S30861A Insect bite (nonvenomous) of abdominal wall, initial encounter: Secondary | ICD-10-CM | POA: Diagnosis not present

## 2023-12-26 DIAGNOSIS — J3089 Other allergic rhinitis: Secondary | ICD-10-CM | POA: Diagnosis not present

## 2023-12-26 DIAGNOSIS — J3081 Allergic rhinitis due to animal (cat) (dog) hair and dander: Secondary | ICD-10-CM | POA: Diagnosis not present

## 2023-12-26 MED ORDER — DOXYCYCLINE HYCLATE 100 MG PO TABS: 100.0000 mg | ORAL_TABLET | Freq: Two times a day (BID) | ORAL | 0 refills | Status: AC

## 2023-12-26 NOTE — Telephone Encounter (Signed)
 Copied from CRM [id number]. Topic: Clinical - Red Word Triage >> Dec 26, 2023  3:08 PM Yolanda T wrote: Red Word that prompted transfer to Nurse Triage: bite on right side of stomach that is hard with red ring around it   Chief Complaint: Insect bite Symptoms: redness, feels hard to touch Frequency: constant Pertinent Negatives: Patient denies pain Disposition: [] ED /[] Urgent Care (no appt availability in office) / [] Appointment(In office/virtual)/ []  [facility]/ [] Home Care/ [x] Refused Recommended Disposition /[] [place]/ [x]  Follow-up with PCP Additional Notes: Per dauighter, Howell Macintosh patient has an apparent insect bite on her abdomen, unsure of onset or what type of insect it was but the area is now red and hard.  RN advising visit with 24 hours however no pcp availability. Offered On demand VV but daughter refused and requested message be sent to office to see if patient can be "worked in" and seen today. Will send message HP to office.    Reason for Disposition  [1] Red or very tender (to touch) area AND [2] started over 24 hours after the bite  Answer Assessment - Initial Assessment Questions 1. TYPE of INSECT: "What type of insect was it?"      Unsure of what type of insect  2. ONSET: "When did you get bitten?"      Unsure of onset  3. LOCATION: "Where is the insect bite located?"      Right side of stomach  4. REDNESS: "Is the area red or pink?" If Yes, ask: "What size is area of redness?" (inches or cm). "When did the redness start?"     Red 1/4 inch around  5. PAIN: "Is there any pain?" If Yes, ask: "How bad is it?"  (Scale 1-10; or mild, moderate, severe)     Does not appear to be in pain  6. ITCHING: "Does it itch?" If Yes, ask: "How bad is the itch?"    - MILD: doesn't interfere with normal activities   - MODERATE-SEVERE: interferes with work, school, sleep, or other activities      Does not appear to itch  7. SWELLING: "How big is the swelling?"  (inches, cm, or compare to coins)     No swelling  8. OTHER SYMPTOMS: "Do you have any other symptoms?"  (e.g., difficulty breathing, hives)     Hard to touch,   9. PREGNANCY: "Is there any chance you are pregnant?" "When was your last menstrual period?"     no  Protocols used: Insect Bite-A-AH

## 2023-12-26 NOTE — Telephone Encounter (Signed)
 Left message for daughter. If she calls back, you can schedule patient for a video visit today in one of the after hour slots under DOD schedule.

## 2023-12-26 NOTE — Progress Notes (Signed)
 Virtual Visit via Video Note  I connected with Kristi Willis on 12/26/23 at  4:30 PM EDT by a video enabled telemedicine application and verified that I am speaking with the correct person using two identifiers.  Patient Location: Home Provider Location: Office/Clinic  I discussed the limitations, risks, security, and privacy concerns of performing an evaluation and management service by video and the availability of in person appointments. I also discussed with the patient that there may be a patient responsible charge related to this service. The patient expressed understanding and agreed to proceed.  Subjective: PCP: Yevette Hem, FNP  Chief Complaint  Patient presents with   Insect Bite   HPI Presents today with daughter who states that she has bite on her stomach.  Unsure of what bit her. She is having erythema and swelling at the site. There is a scab over the site. She was recently in hospital a few weeks ago for a variant of corona virus complicated by hypoxia. She has recently completed course of doxycycline  and tolerated it well.  Denies fever, N/V, confusion.   ROS: Per HPI  Current Outpatient Medications:    Accu-Chek Softclix Lancets lancets, CHECK BLOOD SUGAR ONCE DAILY OR AS DIRECTED Dx E11.40, Disp: 100 each, Rfl: 3   acyclovir  ointment (ZOVIRAX ) 5 %, APPLY TO THE AFFECTED AREA AROUND MOUTH EVERY 3 HOURS AS DIRECTED UNTIL CLEAR, Disp: 15 g, Rfl: 0   albuterol  (VENTOLIN  HFA) 108 (90 Base) MCG/ACT inhaler, INHALE TWO PUFFS EVERY 6 HOURS AS NEEDED FOR WHEEZING OR SHORTNESS OF BREATH, Disp: 8.5 g, Rfl: 2   amLODipine  (NORVASC ) 5 MG tablet, TAKE ONE TABLET BY MOUTH DAILY, Disp: 30 tablet, Rfl: 5   aspirin  81 MG chewable tablet, Chew 81 mg by mouth at bedtime., Disp: , Rfl:    azelastine  (ASTELIN ) 0.1 % nasal spray, Place 2 sprays into both nostrils 2 (two) times daily., Disp: 30 mL, Rfl: 11   budesonide  (PULMICORT ) 0.25 MG/2ML nebulizer solution, Take 2 mLs  (0.25 mg total) by nebulization 2 (two) times daily., Disp: 60 mL, Rfl: 12   cetirizine  (ZYRTEC ) 10 MG tablet, TAKE ONE TABLET DAILY, Disp: 90 tablet, Rfl: 1   Coenzyme Q10 (COQ10 PO), Take 1 tablet by mouth daily., Disp: , Rfl:    cyanocobalamin  (VITAMIN B12) 500 MCG tablet, Take 1 tablet (500 mcg total) by mouth daily., Disp: 30 tablet, Rfl: 0   doxazosin  (CARDURA ) 8 MG tablet, TAKE (1) TABLET DAILY FOR HIGH BLOOD PRESSURE., Disp: 90 tablet, Rfl: 2   doxycycline  (VIBRA -TABS) 100 MG tablet, Take 1 tablet (100 mg total) by mouth 2 (two) times daily., Disp: 20 tablet, Rfl: 0   EPIPEN 2-PAK 0.3 MG/0.3ML SOAJ injection, Inject 0.3 mg into the muscle as needed for anaphylaxis. Reported on 01/24/2016, Disp: , Rfl:    escitalopram  (LEXAPRO ) 20 MG tablet, TAKE ONE TABLET BY MOUTH DAILY, Disp: 30 tablet, Rfl: 5   fluticasone (FLONASE) 50 MCG/ACT nasal spray, Place 1 spray into both nostrils daily., Disp: , Rfl:    furosemide  (LASIX ) 20 MG tablet, Take 1 tablet (20 mg total) by mouth daily., Disp: 30 tablet, Rfl: 3   furosemide  (LASIX ) 40 MG tablet, Take 1 tablet (40 mg total) by mouth daily for 7 days., Disp: 7 tablet, Rfl: 0   glucose blood (ACCU-CHEK AVIVA PLUS) test strip, CHECK BLOOD SUGAR ONCE DAILY OR AS DIRECTED Dx E11.40, Disp: 100 strip, Rfl: 3   guaiFENesin  (MUCINEX ) 600 MG 12 hr tablet, Take 2 tablets (  1,200 mg total) by mouth 2 (two) times daily., Disp: 30 tablet, Rfl: 0   ipratropium-albuterol  (DUONEB) 0.5-2.5 (3) MG/3ML SOLN, Take 3 mLs by nebulization every 6 (six) hours., Disp: 125 mL, Rfl: 0   JANUVIA  50 MG tablet, TAKE ONE TABLET BY MOUTH DAILY, Disp: 30 tablet, Rfl: 5   ketoconazole  (NIZORAL ) 2 % cream, Apply 1 application topically daily., Disp: 30 g, Rfl: 0   levothyroxine  (SYNTHROID ) 75 MCG tablet, TAKE ONE TABLET DAILY BEFORE BREAKFAST, Disp: 90 tablet, Rfl: 1   lisinopril  (ZESTRIL ) 40 MG tablet, TAKE ONE TABLET DAILY, Disp: 90 tablet, Rfl: 1   metFORMIN  (GLUCOPHAGE ) 1000 MG tablet,  TAKE ONE TABLET TWICE DAILY WITH MEAL(S), Disp: 180 tablet, Rfl: 0   metoprolol  succinate (TOPROL -XL) 100 MG 24 hr tablet, TAKE ONE TABLET DAILY, Disp: 90 tablet, Rfl: 1   montelukast  (SINGULAIR ) 10 MG tablet, TAKE ONE TABLET AT BEDTIME, Disp: 30 tablet, Rfl: 5   rosuvastatin  (CRESTOR ) 20 MG tablet, TAKE ONE TABLET AT BEDTIME, Disp: 90 tablet, Rfl: 1  Observations/Objective: There were no vitals filed for this visit. Physical Exam Constitutional:      General: She is awake. She is not in acute distress.    Appearance: Normal appearance. She is well-developed and well-groomed. She is not ill-appearing, toxic-appearing or diaphoretic.  Pulmonary:     Effort: Pulmonary effort is normal.  Skin:         Comments: ~1inch round, erythematous lesion with 2mm central scab  Neurological:     General: No focal deficit present.     Mental Status: She is alert and oriented to person, place, and time.  Psychiatric:        Attention and Perception: Attention and perception normal.        Mood and Affect: Mood and affect normal.        Speech: Speech normal.        Behavior: Behavior normal. Behavior is cooperative.        Thought Content: Thought content normal.        Cognition and Memory: Cognition and memory normal.        Judgment: Judgment normal.    Assessment and Plan: 1. Insect bite of abdominal wall, initial encounter (Primary) Concern for erythema migrans, prescribed medication as below. Discussed with patient and daughter using different antibiotic to prevent abx resistance given recent use, however, they wanted to extend current abx as she was tolerating it well. Shared decision making used to continue current abx. Discussed signs and symptoms to monitor. Reviewed hospital discharge note from [place], MD.  - doxycycline  (VIBRA -TABS) 100 MG tablet; Take 1 tablet (100 mg total) by mouth 2 (two) times daily for 10 days.  Dispense: 20 tablet; Refill: 0  Total time 6:30  Follow Up  Instructions: Return if symptoms worsen or fail to improve.   I discussed the assessment and treatment plan with the patient. The patient was provided an opportunity to ask questions, and all were answered. The patient agreed with the plan and demonstrated an understanding of the instructions.   The patient was advised to call back or seek an in-person evaluation if the symptoms worsen or if the condition fails to improve as anticipated.  The above assessment and management plan was discussed with the patient. The patient verbalized understanding of and has agreed to the management plan.   Jacqualyn Mates, DNP-FNP [facility] [address] [place], [place] [address] [phone]

## 2023-12-29 ENCOUNTER — Telehealth

## 2023-12-29 ENCOUNTER — Ambulatory Visit

## 2023-12-29 DIAGNOSIS — R2681 Unsteadiness on feet: Secondary | ICD-10-CM | POA: Diagnosis not present

## 2023-12-29 NOTE — Therapy (Signed)
 OUTPATIENT PHYSICAL THERAPY LOWER EXTREMITY TREATMENT   Patient Name: Kristi Willis MRN: [id number] DOB:05/28/1941, 83 y.o., female Today's Date: 12/29/2023  END OF SESSION:  PT End of Session - 12/29/23 1308     Visit Number 4    Number of Visits 12    Authorization - Number of Visits 10     PT Start Time 1305    PT Stop Time 1345    PT Time Calculation (min) 40 min    Activity Tolerance Patient tolerated treatment well    Behavior During Therapy WFL for tasks assessed/performed               Past Medical History:  Diagnosis Date   Allergy     Asthma    Cancer (HCC)    skin   Diabetes mellitus    GERD (gastroesophageal reflux disease)    hiatal hernia   Hypercholesteremia    Hypertension    Neuromuscular disorder (HCC)    DM neuropathy   Neuropathy    SVT (supraventricular tachycardia) (HCC)    Past Surgical History:  Procedure Laterality Date   BREAST BIOPSY     left breast biopsy   LUMBAR LAMINECTOMY  08/10   SKIN CANCER EXCISION  2005   SPINE SURGERY     lumbar laminectomy   Patient Active Problem List   Diagnosis Date Noted   Weakness 11/19/2023   Hypoxia 11/19/2023   Allergic rhinitis 03/05/2022   Pulmonary congestion 02/11/2021   Aortic atherosclerosis (HCC) 02/11/2021   Hypo-osmolar hyponatremia 02/10/2021   Primary progressive aphasia (HCC) 03/23/2020   Pain in left shoulder 11/03/2019   Acute metabolic encephalopathy 08/05/2019   Iron deficiency anemia 08/05/2019   QT prolongation 08/05/2019   Hypothyroidism 02/12/2019   Speech articulation disorder 09/01/2018   Essential hypertension 10/03/2017   GAD (generalized anxiety disorder) 07/03/2017   Diabetes mellitus (HCC) 09/27/2016   Depression 03/29/2016   Insomnia 07/18/2015   Vitamin D  deficiency 10/11/2013   Right carotid bruit 03/02/2013   Degenerative disc disease, lumbar 11/27/2012   Asthma 11/13/2010   Hyperlipidemia associated with type 2 diabetes mellitus (HCC)  11/13/2010   Cancer of skin 11/13/2010   Diabetic neuropathy (HCC) 11/13/2010   Wide-complex tachycardia==Atrial Tachycardia 11/13/2010    REFERRING PROVIDER: Tommas Fragmin  REFERRING DIAG: Gait instability.  THERAPY DIAG:  Unsteadiness on feet  Rationale for Evaluation and Treatment: Rehabilitation  ONSET DATE: Ongoing.   SUBJECTIVE:   SUBJECTIVE STATEMENT: Subjective report unable to be obtained secondary to aphasia.   PERTINENT HISTORY: Please see above.   PAIN:  Are you having pain? No  PRECAUTIONS: Fall.  Please be with patient at all times.    WEIGHT BEARING RESTRICTIONS: No  FALLS:  Has patient fallen in last 6 months? Yes. Number of falls 2.  LIVING ENVIRONMENT: Lives with: lives alone Lives in: House/apartment Has following equipment at home: None  OCCUPATION: Retired.    PLOF: Independent with basic ADLs  PATIENT GOALS: Improve mobility.   OBJECTIVE:  Note: Objective measures were completed at Evaluation unless otherwise noted.  POSTURE: rounded shoulders and forward head  LOWER EXTREMITY ROM:  WFL.  LOWER EXTREMITY MMT:  Patient able to provide a normal strength grade via manual muscle testing to bilateral hip and knees.  FUNCTIONAL TESTS:  5 times sit to stand: 22 seconds. Timed up and go (TUG): 20 seconds  GAIT: The patient's gait is slow and purposeful with a wide base of support.  Assessed stairs with ascent and descent  performed with a railing and CGA.  Decrease step and stride length.                                                                                                                                 TREATMENT DATE:    12/29/23    EXERCISE LOG  Exercise Repetitions and Resistance Comments  Nustep  L4 x 20 minutes   LAQs 2# x 20 reps bil   Seated Marches 2# x 20 reps bil   Seated heel/toe raises  2.5 minutes   Seated HS curls  Red t-band x 3 x 10 reps each    Seated clams  Red t-band x 30 reps    Seated hip ADD  isometric  20 reps w/ 5 second hold   Gait training     Blank cell = exercise not performed today                                    12/22/23 EXERCISE LOG  Exercise Repetitions and Resistance Comments  Nustep  L4 x 18 minutes    LAQ  2# x 20 reps each  Alternating LE   Seated marching  2# x 15 reps each  Alternating LE  Seated heel/toe raised  2# x 30 reps   Seated hip ADD isometric  2.5 minutes w/ 10 second hold   Sit to stand  10 reps  Without UE support   Blank cell = exercise not performed today   PATIENT EDUCATION:  Education details:  Person educated:  International aid/development worker:  Education comprehension:   HOME EXERCISE PROGRAM:   ASSESSMENT:  CLINICAL IMPRESSION: Pt arrives for today's treatment session feeling well.  Pt able to tolerate introduction to weighted LAQs and seated marches today without issue.  Pt also able to tolerate increased reps or time with several previously performed exercises.  This PTA performed all exercises with pt to insure better completion of reps and understanding of proper technique.  Pt monitored throughout for adverse reactions to exercises with none observed.    OBJECTIVE IMPAIRMENTS: Abnormal gait and decreased activity tolerance.   ACTIVITY LIMITATIONS: carrying, lifting, and locomotion level  PARTICIPATION LIMITATIONS: meal prep, cleaning, laundry, and yard work  PERSONAL FACTORS: 1-2 comorbidities: diabetic neuropathy, primary progressive aphasia are also affecting patient's functional outcome.   REHAB POTENTIAL: Good minus  CLINICAL DECISION MAKING: Evolving/moderate complexity  EVALUATION COMPLEXITY: Moderate   GOALS:  LONG TERM GOALS: Target date: 01/26/24  Ind with a HEP.  Goal status: INITIAL  2.  Improve TUG by 4-5 seconds.  Goal status: INITIAL  3.  Improve 5 time sit to stand by 5 seconds.  Goal status: INITIAL  4.  Walk 500 feet with supervision only.  Goal status: INITIAL    PLAN:  PT FREQUENCY: 2x/week  PT  DURATION: 6 weeks  PLANNED INTERVENTIONS: 97110-Therapeutic  exercises, 97530- Therapeutic activity, [id number]- Neuromuscular re-education, 97535- Self Care, and 16109- Manual therapy  PLAN FOR NEXT SESSION: Nustep, gait and balance activities.  Strengthening exercises.     Deryl Flora, PTA 12/29/2023, 2:39 PM

## 2023-12-31 ENCOUNTER — Ambulatory Visit

## 2023-12-31 DIAGNOSIS — R2681 Unsteadiness on feet: Secondary | ICD-10-CM

## 2023-12-31 NOTE — Therapy (Signed)
 OUTPATIENT PHYSICAL THERAPY LOWER EXTREMITY TREATMENT   Patient Name: Kristi Willis MRN: [id number] DOB:12/18/1940, 83 y.o., female Today's Date: 12/31/2023  END OF SESSION:  PT End of Session - 12/31/23 1310     Visit Number 5    Number of Visits 12    Authorization - Number of Visits 10    PT Start Time 1310    PT Stop Time 1344    PT Time Calculation (min) 34 min    Activity Tolerance Patient tolerated treatment well    Behavior During Therapy WFL for tasks assessed/performed               Past Medical History:  Diagnosis Date   Allergy     Asthma    Cancer (HCC)    skin   Diabetes mellitus    GERD (gastroesophageal reflux disease)    hiatal hernia   Hypercholesteremia    Hypertension    Neuromuscular disorder (HCC)    DM neuropathy   Neuropathy    SVT (supraventricular tachycardia) (HCC)    Past Surgical History:  Procedure Laterality Date   BREAST BIOPSY     left breast biopsy   LUMBAR LAMINECTOMY  08/10   SKIN CANCER EXCISION  2005   SPINE SURGERY     lumbar laminectomy   Patient Active Problem List   Diagnosis Date Noted   Weakness 11/19/2023   Hypoxia 11/19/2023   Allergic rhinitis 03/05/2022   Pulmonary congestion 02/11/2021   Aortic atherosclerosis (HCC) 02/11/2021   Hypo-osmolar hyponatremia 02/10/2021   Primary progressive aphasia (HCC) 03/23/2020   Pain in left shoulder 11/03/2019   Acute metabolic encephalopathy 08/05/2019   Iron deficiency anemia 08/05/2019   QT prolongation 08/05/2019   Hypothyroidism 02/12/2019   Speech articulation disorder 09/01/2018   Essential hypertension 10/03/2017   GAD (generalized anxiety disorder) 07/03/2017   Diabetes mellitus (HCC) 09/27/2016   Depression 03/29/2016   Insomnia 07/18/2015   Vitamin D  deficiency 10/11/2013   Right carotid bruit 03/02/2013   Degenerative disc disease, lumbar 11/27/2012   Asthma 11/13/2010   Hyperlipidemia associated with type 2 diabetes mellitus (HCC)  11/13/2010   Cancer of skin 11/13/2010   Diabetic neuropathy (HCC) 11/13/2010   Wide-complex tachycardia==Atrial Tachycardia 11/13/2010    REFERRING PROVIDER: Tommas Fragmin  REFERRING DIAG: Gait instability.  THERAPY DIAG:  Unsteadiness on feet  Rationale for Evaluation and Treatment: Rehabilitation  ONSET DATE: Ongoing.   SUBJECTIVE:   SUBJECTIVE STATEMENT: Subjective report unable to be obtained secondary to aphasia.   PERTINENT HISTORY: Please see above.   PAIN:  Are you having pain? No  PRECAUTIONS: Fall.  Please be with patient at all times.    WEIGHT BEARING RESTRICTIONS: No  FALLS:  Has patient fallen in last 6 months? Yes. Number of falls 2.  LIVING ENVIRONMENT: Lives with: lives alone Lives in: House/apartment Has following equipment at home: None  OCCUPATION: Retired.    PLOF: Independent with basic ADLs  PATIENT GOALS: Improve mobility.   OBJECTIVE:  Note: Objective measures were completed at Evaluation unless otherwise noted.  POSTURE: rounded shoulders and forward head  LOWER EXTREMITY ROM:  WFL.  LOWER EXTREMITY MMT:  Patient able to provide a normal strength grade via manual muscle testing to bilateral hip and knees.  FUNCTIONAL TESTS:  5 times sit to stand: 22 seconds. Timed up and go (TUG): 20 seconds  GAIT: The patient's gait is slow and purposeful with a wide base of support.  Assessed stairs with ascent and descent performed  with a railing and CGA.  Decrease step and stride length.                                                                                                                                 TREATMENT DATE:    12/31/23    EXERCISE LOG  Exercise Repetitions and Resistance Comments  Nustep  L4 x 20 minutes   LAQs 2# x 25 reps bil   Seated Marches 2# x 25 reps bil   Seated heel/toe raises     Seated HS curls  Green x 20 reps bil   Seated clams  Green x 20 reps   Seated hip ADD isometric     Gait training      Blank cell = exercise not performed today                                    12/22/23 EXERCISE LOG  Exercise Repetitions and Resistance Comments  Nustep  L4 x 18 minutes    LAQ  2# x 20 reps each  Alternating LE   Seated marching  2# x 15 reps each  Alternating LE  Seated heel/toe raised  2# x 30 reps   Seated hip ADD isometric  2.5 minutes w/ 10 second hold   Sit to stand  10 reps  Without UE support   Blank cell = exercise not performed today   PATIENT EDUCATION:  Education details:  Person educated:  International aid/development worker:  Education comprehension:   HOME EXERCISE PROGRAM:   ASSESSMENT:  CLINICAL IMPRESSION: Pt arrives for today's treatment session feeling well.  Pt 10 mins late for today's treatment.  Pt's TUG and 5 STS test performed today with times very close to those collected at her eval.  Pt able to tolerate increased reps with LAQs and increased resistance with seated ham curls and hip abduction during today's session.  Pt given seated rest breaks as needed due to fatigue.  Pt in good spirits at completion of today's treatment session.  OBJECTIVE IMPAIRMENTS: Abnormal gait and decreased activity tolerance.   ACTIVITY LIMITATIONS: carrying, lifting, and locomotion level  PARTICIPATION LIMITATIONS: meal prep, cleaning, laundry, and yard work  PERSONAL FACTORS: 1-2 comorbidities: diabetic neuropathy, primary progressive aphasia are also affecting patient's functional outcome.   REHAB POTENTIAL: Good minus  CLINICAL DECISION MAKING: Evolving/moderate complexity  EVALUATION COMPLEXITY: Moderate   GOALS:  LONG TERM GOALS: Target date: 01/26/24  Ind with a HEP.  Goal status: IN PROGRESS  2.  Improve TUG by 4-5 seconds.   5/14: same as baseline Goal status: IN PROGRESS  3.  Improve 5 time sit to stand by 5 seconds.   5/14: same as baseline Goal status: IN PROGRESS  4.  Walk 500 feet with supervision only.  Goal status: IN PROGRESS    PLAN:  PT FREQUENCY:  2x/week  PT DURATION: 6 weeks  PLANNED INTERVENTIONS: 97110-Therapeutic exercises, 97530- Therapeutic activity, [id number]- Neuromuscular re-education, 97535- Self Care, and 40981- Manual therapy  PLAN FOR NEXT SESSION: Nustep, gait and balance activities.  Strengthening exercises.     Deryl Flora, PTA 12/31/2023, 2:22 PM

## 2024-01-01 NOTE — Telephone Encounter (Signed)
 fyi

## 2024-01-02 DIAGNOSIS — J3089 Other allergic rhinitis: Secondary | ICD-10-CM | POA: Diagnosis not present

## 2024-01-02 DIAGNOSIS — J301 Allergic rhinitis due to pollen: Secondary | ICD-10-CM | POA: Diagnosis not present

## 2024-01-02 DIAGNOSIS — J3081 Allergic rhinitis due to animal (cat) (dog) hair and dander: Secondary | ICD-10-CM | POA: Diagnosis not present

## 2024-01-05 ENCOUNTER — Ambulatory Visit

## 2024-01-05 DIAGNOSIS — R2681 Unsteadiness on feet: Secondary | ICD-10-CM | POA: Diagnosis not present

## 2024-01-05 NOTE — Therapy (Signed)
 OUTPATIENT PHYSICAL THERAPY LOWER EXTREMITY TREATMENT   Patient Name: ANNIA GOMM MRN: [id number] DOB:08-May-1941, 83 y.o., female Today's Date: 01/05/2024  END OF SESSION:  PT End of Session - 01/05/24 1505     Visit Number 6    Number of Visits 12    Authorization - Number of Visits 10    PT Start Time 1436   Patient arrived late to her appointment.   PT Stop Time 1513    PT Time Calculation (min) 37 min    Activity Tolerance Patient tolerated treatment well    Behavior During Therapy WFL for tasks assessed/performed                Past Medical History:  Diagnosis Date   Allergy     Asthma    Cancer (HCC)    skin   Diabetes mellitus    GERD (gastroesophageal reflux disease)    hiatal hernia   Hypercholesteremia    Hypertension    Neuromuscular disorder (HCC)    DM neuropathy   Neuropathy    SVT (supraventricular tachycardia) (HCC)    Past Surgical History:  Procedure Laterality Date   BREAST BIOPSY     left breast biopsy   LUMBAR LAMINECTOMY  08/10   SKIN CANCER EXCISION  2005   SPINE SURGERY     lumbar laminectomy   Patient Active Problem List   Diagnosis Date Noted   Weakness 11/19/2023   Hypoxia 11/19/2023   Allergic rhinitis 03/05/2022   Pulmonary congestion 02/11/2021   Aortic atherosclerosis (HCC) 02/11/2021   Hypo-osmolar hyponatremia 02/10/2021   Primary progressive aphasia (HCC) 03/23/2020   Pain in left shoulder 11/03/2019   Acute metabolic encephalopathy 08/05/2019   Iron deficiency anemia 08/05/2019   QT prolongation 08/05/2019   Hypothyroidism 02/12/2019   Speech articulation disorder 09/01/2018   Essential hypertension 10/03/2017   GAD (generalized anxiety disorder) 07/03/2017   Diabetes mellitus (HCC) 09/27/2016   Depression 03/29/2016   Insomnia 07/18/2015   Vitamin D  deficiency 10/11/2013   Right carotid bruit 03/02/2013   Degenerative disc disease, lumbar 11/27/2012   Asthma 11/13/2010   Hyperlipidemia associated  with type 2 diabetes mellitus (HCC) 11/13/2010   Cancer of skin 11/13/2010   Diabetic neuropathy (HCC) 11/13/2010   Wide-complex tachycardia==Atrial Tachycardia 11/13/2010    REFERRING PROVIDER: Tommas Fragmin  REFERRING DIAG: Gait instability.  THERAPY DIAG:  Unsteadiness on feet  Rationale for Evaluation and Treatment: Rehabilitation  ONSET DATE: Ongoing.   SUBJECTIVE:   SUBJECTIVE STATEMENT: Subjective report unable to be obtained secondary to aphasia.   PERTINENT HISTORY: Please see above.   PAIN:  Are you having pain? No  PRECAUTIONS: Fall.  Please be with patient at all times.    WEIGHT BEARING RESTRICTIONS: No  FALLS:  Has patient fallen in last 6 months? Yes. Number of falls 2.  LIVING ENVIRONMENT: Lives with: lives alone Lives in: House/apartment Has following equipment at home: None  OCCUPATION: Retired.    PLOF: Independent with basic ADLs  PATIENT GOALS: Improve mobility.   OBJECTIVE:  Note: Objective measures were completed at Evaluation unless otherwise noted.  POSTURE: rounded shoulders and forward head  LOWER EXTREMITY ROM:  WFL.  LOWER EXTREMITY MMT:  Patient able to provide a normal strength grade via manual muscle testing to bilateral hip and knees.  FUNCTIONAL TESTS:  5 times sit to stand: 22 seconds. Timed up and go (TUG): 20 seconds  GAIT: The patient's gait is slow and purposeful with a wide base of support.  Assessed stairs with ascent and descent performed with a railing and CGA.  Decrease step and stride length.                                                                                                                                 TREATMENT DATE:                                     01/05/24 EXERCISE LOG  Exercise Repetitions and Resistance Comments  Nustep  L4 x 20 minutes   Seated heel/toe raises  20 reps    Seated HS curls  Green t-band x 30 reps each    Seated hip ADD isometric  3 minutes w/ 5 second hold    LAQ 2# x 20 reps each    Seated marching  2# x 2 minutes     Standing marching 2.5 minutes   Sit to stand  10 reps  With UE support from armrests   Blank cell = exercise not performed today   12/31/23    EXERCISE LOG  Exercise Repetitions and Resistance Comments  Nustep  L4 x 20 minutes   LAQs 2# x 25 reps bil   Seated Marches 2# x 25 reps bil   Seated heel/toe raises     Seated HS curls  Green x 20 reps bil   Seated clams  Green x 20 reps   Seated hip ADD isometric     Gait training     Blank cell = exercise not performed today                                    12/22/23 EXERCISE LOG  Exercise Repetitions and Resistance Comments  Nustep  L4 x 18 minutes    LAQ  2# x 20 reps each  Alternating LE   Seated marching  2# x 15 reps each  Alternating LE  Seated heel/toe raised  2# x 30 reps   Seated hip ADD isometric  2.5 minutes w/ 10 second hold   Sit to stand  10 reps  Without UE support   Blank cell = exercise not performed today   PATIENT EDUCATION:  Education details:  Person educated:  International aid/development worker:  Education comprehension:   HOME EXERCISE PROGRAM:   ASSESSMENT:  CLINICAL IMPRESSION: Patient was progressed with standing marching and multiple familiar interventions for improved muscular strength and endurance needed to facilitate improved gait mechanics. She required demonstrate with today's interventions for proper exercise performance. She exhibited no significant alteration in her gait pattern with any of today's interventions. She continues to require skilled physical therapy to address her remaining impairments to maximize her functional mobility.   OBJECTIVE IMPAIRMENTS: Abnormal gait and decreased activity tolerance.   ACTIVITY LIMITATIONS: carrying, lifting, and  locomotion level  PARTICIPATION LIMITATIONS: meal prep, cleaning, laundry, and yard work  PERSONAL FACTORS: 1-2 comorbidities: diabetic neuropathy, primary progressive aphasia are also affecting  patient's functional outcome.   REHAB POTENTIAL: Good minus  CLINICAL DECISION MAKING: Evolving/moderate complexity  EVALUATION COMPLEXITY: Moderate   GOALS:  LONG TERM GOALS: Target date: 01/26/24  Ind with a HEP.  Goal status: IN PROGRESS  2.  Improve TUG by 4-5 seconds.   5/14: same as baseline Goal status: IN PROGRESS  3.  Improve 5 time sit to stand by 5 seconds.   5/14: same as baseline Goal status: IN PROGRESS  4.  Walk 500 feet with supervision only.  Goal status: IN PROGRESS    PLAN:  PT FREQUENCY: 2x/week  PT DURATION: 6 weeks  PLANNED INTERVENTIONS: 97110-Therapeutic exercises, 97530- Therapeutic activity, [id number]- Neuromuscular re-education, 97535- Self Care, and 40981- Manual therapy  PLAN FOR NEXT SESSION: Nustep, gait and balance activities.  Strengthening exercises.     Lane Pinon, PT 01/05/2024, 3:19 PM

## 2024-01-07 ENCOUNTER — Ambulatory Visit

## 2024-01-07 DIAGNOSIS — R2681 Unsteadiness on feet: Secondary | ICD-10-CM

## 2024-01-07 NOTE — Therapy (Signed)
 OUTPATIENT PHYSICAL THERAPY LOWER EXTREMITY TREATMENT   Patient Name: Kristi Willis MRN: [id number] DOB:Jul 29, 1941, 83 y.o., female Today's Date: 01/07/2024  END OF SESSION:  PT End of Session - 01/07/24 1306     Visit Number 7    Number of Visits 12    Authorization - Number of Visits 10    PT Start Time 1305    PT Stop Time 1344    PT Time Calculation (min) 39 min    Activity Tolerance Patient tolerated treatment well    Behavior During Therapy WFL for tasks assessed/performed                Past Medical History:  Diagnosis Date   Allergy     Asthma    Cancer (HCC)    skin   Diabetes mellitus    GERD (gastroesophageal reflux disease)    hiatal hernia   Hypercholesteremia    Hypertension    Neuromuscular disorder (HCC)    DM neuropathy   Neuropathy    SVT (supraventricular tachycardia) (HCC)    Past Surgical History:  Procedure Laterality Date   BREAST BIOPSY     left breast biopsy   LUMBAR LAMINECTOMY  08/10   SKIN CANCER EXCISION  2005   SPINE SURGERY     lumbar laminectomy   Patient Active Problem List   Diagnosis Date Noted   Weakness 11/19/2023   Hypoxia 11/19/2023   Allergic rhinitis 03/05/2022   Pulmonary congestion 02/11/2021   Aortic atherosclerosis (HCC) 02/11/2021   Hypo-osmolar hyponatremia 02/10/2021   Primary progressive aphasia (HCC) 03/23/2020   Pain in left shoulder 11/03/2019   Acute metabolic encephalopathy 08/05/2019   Iron deficiency anemia 08/05/2019   QT prolongation 08/05/2019   Hypothyroidism 02/12/2019   Speech articulation disorder 09/01/2018   Essential hypertension 10/03/2017   GAD (generalized anxiety disorder) 07/03/2017   Diabetes mellitus (HCC) 09/27/2016   Depression 03/29/2016   Insomnia 07/18/2015   Vitamin D  deficiency 10/11/2013   Right carotid bruit 03/02/2013   Degenerative disc disease, lumbar 11/27/2012   Asthma 11/13/2010   Hyperlipidemia associated with type 2 diabetes mellitus (HCC)  11/13/2010   Cancer of skin 11/13/2010   Diabetic neuropathy (HCC) 11/13/2010   Wide-complex tachycardia==Atrial Tachycardia 11/13/2010    REFERRING PROVIDER: Tommas Fragmin  REFERRING DIAG: Gait instability.  THERAPY DIAG:  Unsteadiness on feet  Rationale for Evaluation and Treatment: Rehabilitation  ONSET DATE: Ongoing.   SUBJECTIVE:   SUBJECTIVE STATEMENT: Subjective report unable to be obtained secondary to aphasia.   PERTINENT HISTORY: Please see above.   PAIN:  Are you having pain? No  PRECAUTIONS: Fall.  Please be with patient at all times.    WEIGHT BEARING RESTRICTIONS: No  FALLS:  Has patient fallen in last 6 months? Yes. Number of falls 2.  LIVING ENVIRONMENT: Lives with: lives alone Lives in: House/apartment Has following equipment at home: None  OCCUPATION: Retired.    PLOF: Independent with basic ADLs  PATIENT GOALS: Improve mobility.   OBJECTIVE:  Note: Objective measures were completed at Evaluation unless otherwise noted.  POSTURE: rounded shoulders and forward head  LOWER EXTREMITY ROM:  WFL.  LOWER EXTREMITY MMT:  Patient able to provide a normal strength grade via manual muscle testing to bilateral hip and knees.  FUNCTIONAL TESTS:  5 times sit to stand: 22 seconds. Timed up and go (TUG): 20 seconds  GAIT: The patient's gait is slow and purposeful with a wide base of support.  Assessed stairs with ascent and descent  performed with a railing and CGA.  Decrease step and stride length.                                                                                                                                 TREATMENT DATE:                                     01/07/24 EXERCISE LOG  Exercise Repetitions and Resistance Comments  Nustep  L4 x 20 minutes   Standing heel/toe raises  20 reps    Standing Hip Abduction 20 reps bil   Seated HS curls  Green t-band x 30 reps each    Seated hip ADD isometric  3 minutes w/ 5 second hold    LAQ 2# x 25 reps each    Seated marching  2# x 2.5 minutes     Standing marching 2.5 minutes   Standing Ball Toss 1.5 mins CGA +1   Sit to stand  12 reps  With UE support from armrests   Blank cell = exercise not performed today   12/31/23    EXERCISE LOG  Exercise Repetitions and Resistance Comments  Nustep  L4 x 20 minutes   LAQs 2# x 25 reps bil   Seated Marches 2# x 25 reps bil   Seated heel/toe raises     Seated HS curls  Green x 20 reps bil   Seated clams  Green x 20 reps   Seated hip ADD isometric     Gait training     Blank cell = exercise not performed today                                    12/22/23 EXERCISE LOG  Exercise Repetitions and Resistance Comments  Nustep  L4 x 18 minutes    LAQ  2# x 20 reps each  Alternating LE   Seated marching  2# x 15 reps each  Alternating LE  Seated heel/toe raised  2# x 30 reps   Seated hip ADD isometric  2.5 minutes w/ 10 second hold   Sit to stand  10 reps  Without UE support   Blank cell = exercise not performed today   PATIENT EDUCATION:  Education details:  Person educated:  International aid/development worker:  Education comprehension:   HOME EXERCISE PROGRAM:   ASSESSMENT:  CLINICAL IMPRESSION: Pt arrives for today's treatment session feeling well.  Pt able to tolerate addition of several standing exercises today.  Pt requiring BUE support with standing heel/toe raises and standing marches.  Pt requiring CGA/sup +1 with standing ball tosses.  Pt also able to tolerate additional reps/resistance or time with all previously performed exercises.  Pt in good spirits at completion of today's treatment session.   OBJECTIVE  IMPAIRMENTS: Abnormal gait and decreased activity tolerance.   ACTIVITY LIMITATIONS: carrying, lifting, and locomotion level  PARTICIPATION LIMITATIONS: meal prep, cleaning, laundry, and yard work  PERSONAL FACTORS: 1-2 comorbidities: diabetic neuropathy, primary progressive aphasia are also affecting patient's  functional outcome.   REHAB POTENTIAL: Good minus  CLINICAL DECISION MAKING: Evolving/moderate complexity  EVALUATION COMPLEXITY: Moderate   GOALS:  LONG TERM GOALS: Target date: 01/26/24  Ind with a HEP.  Goal status: IN PROGRESS  2.  Improve TUG by 4-5 seconds.   5/14: same as baseline Goal status: IN PROGRESS  3.  Improve 5 time sit to stand by 5 seconds.   5/14: same as baseline Goal status: IN PROGRESS  4.  Walk 500 feet with supervision only.  Goal status: IN PROGRESS   PLAN:  PT FREQUENCY: 2x/week  PT DURATION: 6 weeks  PLANNED INTERVENTIONS: 97110-Therapeutic exercises, 97530- Therapeutic activity, [id number]- Neuromuscular re-education, 97535- Self Care, and 16109- Manual therapy  PLAN FOR NEXT SESSION: Nustep, gait and balance activities.  Strengthening exercises.     Deryl Flora, PTA 01/07/2024, 3:09 PM

## 2024-01-08 DIAGNOSIS — J3081 Allergic rhinitis due to animal (cat) (dog) hair and dander: Secondary | ICD-10-CM | POA: Diagnosis not present

## 2024-01-08 DIAGNOSIS — J3089 Other allergic rhinitis: Secondary | ICD-10-CM | POA: Diagnosis not present

## 2024-01-08 DIAGNOSIS — J301 Allergic rhinitis due to pollen: Secondary | ICD-10-CM | POA: Diagnosis not present

## 2024-01-13 ENCOUNTER — Other Ambulatory Visit: Payer: Self-pay | Admitting: Family

## 2024-01-13 DIAGNOSIS — E1142 Type 2 diabetes mellitus with diabetic polyneuropathy: Secondary | ICD-10-CM

## 2024-01-14 ENCOUNTER — Ambulatory Visit

## 2024-01-14 DIAGNOSIS — R2681 Unsteadiness on feet: Secondary | ICD-10-CM | POA: Diagnosis not present

## 2024-01-14 NOTE — Therapy (Signed)
 OUTPATIENT PHYSICAL THERAPY LOWER EXTREMITY TREATMENT   Patient Name: Kristi Willis MRN: [id number] DOB:[birth year], 83 y.o., female Today's Date: 01/14/2024  END OF SESSION:  PT End of Session - 01/14/24 1308     Visit Number 8    Number of Visits 12    Authorization - Number of Visits 10    PT Start Time 1306    PT Stop Time 1345    PT Time Calculation (min) 39 min    Activity Tolerance Patient tolerated treatment well    Behavior During Therapy WFL for tasks assessed/performed                Past Medical History:  Diagnosis Date   Allergy     Asthma    Cancer (HCC)    skin   Diabetes mellitus    GERD (gastroesophageal reflux disease)    hiatal hernia   Hypercholesteremia    Hypertension    Neuromuscular disorder (HCC)    DM neuropathy   Neuropathy    SVT (supraventricular tachycardia) (HCC)    Past Surgical History:  Procedure Laterality Date   BREAST BIOPSY     left breast biopsy   LUMBAR LAMINECTOMY  08/10   SKIN CANCER EXCISION  2005   SPINE SURGERY     lumbar laminectomy   Patient Active Problem List   Diagnosis Date Noted   Weakness 11/19/2023   Hypoxia 11/19/2023   Allergic rhinitis 03/05/2022   Pulmonary congestion 02/11/2021   Aortic atherosclerosis (HCC) 02/11/2021   Hypo-osmolar hyponatremia 02/10/2021   Primary progressive aphasia (HCC) 03/23/2020   Pain in left shoulder 11/03/2019   Acute metabolic encephalopathy 08/05/2019   Iron deficiency anemia 08/05/2019   QT prolongation 08/05/2019   Hypothyroidism 02/12/2019   Speech articulation disorder 09/01/2018   Essential hypertension 10/03/2017   GAD (generalized anxiety disorder) 07/03/2017   Diabetes mellitus (HCC) 09/27/2016   Depression 03/29/2016   Insomnia 07/18/2015   Vitamin D  deficiency 10/11/2013   Right carotid bruit 03/02/2013   Degenerative disc disease, lumbar 11/27/2012   Asthma 11/13/2010   Hyperlipidemia associated with type 2 diabetes mellitus (HCC)  11/13/2010   Cancer of skin 11/13/2010   Diabetic neuropathy (HCC) 11/13/2010   Wide-complex tachycardia==Atrial Tachycardia 11/13/2010    REFERRING PROVIDER: Tommas Fragmin  REFERRING DIAG: Gait instability.  THERAPY DIAG:  Unsteadiness on feet  Rationale for Evaluation and Treatment: Rehabilitation  ONSET DATE: Ongoing.   SUBJECTIVE:   SUBJECTIVE STATEMENT: Subjective report unable to be obtained secondary to aphasia.   PERTINENT HISTORY: Please see above.   PAIN:  Are you having pain? No  PRECAUTIONS: Fall.  Please be with patient at all times.    WEIGHT BEARING RESTRICTIONS: No  FALLS:  Has patient fallen in last 6 months? Yes. Number of falls 2.  LIVING ENVIRONMENT: Lives with: lives alone Lives in: House/apartment Has following equipment at home: None  OCCUPATION: Retired.    PLOF: Independent with basic ADLs  PATIENT GOALS: Improve mobility.   OBJECTIVE:  Note: Objective measures were completed at Evaluation unless otherwise noted.  POSTURE: rounded shoulders and forward head  LOWER EXTREMITY ROM:  WFL.  LOWER EXTREMITY MMT:  Patient able to provide a normal strength grade via manual muscle testing to bilateral hip and knees.  FUNCTIONAL TESTS:  5 times sit to stand: 22 seconds. Timed up and go (TUG): 20 seconds  GAIT: The patient's gait is slow and purposeful with a wide base of support.  Assessed stairs with ascent and descent  performed with a railing and CGA.  Decrease step and stride length.                                                                                                                                 TREATMENT DATE:    01/14/24    EXERCISE LOG  Exercise Repetitions and Resistance Comments  Nustep  L4 x 20 minutes   Standing heel/toe raises  25 reps each   Standing Hip Abduction 25 reps bil   Standing Hip Flexion 25 reps bil   Seated HS curls  Green t-band x 30 reps each    Seated hip ADD isometric  3 minutes w/ 5  second hold   LAQ 3# x 20 reps each    Seated marching  3# x 1 minutes     Standing marching 3 minutes   Standing Ball Toss    Sit to stand  12 reps  With UE support from armrests   Blank cell = exercise not performed today   12/31/23    EXERCISE LOG  Exercise Repetitions and Resistance Comments  Nustep  L4 x 20 minutes   LAQs 2# x 25 reps bil   Seated Marches 2# x 25 reps bil   Seated heel/toe raises     Seated HS curls  Green x 20 reps bil   Seated clams  Green x 20 reps   Seated hip ADD isometric     Gait training     Blank cell = exercise not performed today                                    12/22/23 EXERCISE LOG  Exercise Repetitions and Resistance Comments  Nustep  L4 x 18 minutes    LAQ  2# x 20 reps each  Alternating LE   Seated marching  2# x 15 reps each  Alternating LE  Seated heel/toe raised  2# x 30 reps   Seated hip ADD isometric  2.5 minutes w/ 10 second hold   Sit to stand  10 reps  Without UE support   Blank cell = exercise not performed today   PATIENT EDUCATION:  Education details:  Person educated:  International aid/development worker:  Education comprehension:   HOME EXERCISE PROGRAM:   ASSESSMENT:  CLINICAL IMPRESSION: Pt arrives for today's treatment session feeling well.  Pt able to tolerate the addition of standing hip flexion today with min cues required for proper posture and technique.  Pt requiring BUE support with all standing exercises for safety and stability.  Pt able to tolerate increased weight with seated LAQs and marches today as well without discomfort.  Pt in good spirits at completion of today's treatment session.   OBJECTIVE IMPAIRMENTS: Abnormal gait and decreased activity tolerance.   ACTIVITY LIMITATIONS: carrying, lifting, and locomotion level  PARTICIPATION LIMITATIONS: meal prep,  cleaning, laundry, and yard work  PERSONAL FACTORS: 1-2 comorbidities: diabetic neuropathy, primary progressive aphasia are also affecting patient's functional  outcome.   REHAB POTENTIAL: Good minus  CLINICAL DECISION MAKING: Evolving/moderate complexity  EVALUATION COMPLEXITY: Moderate   GOALS:  LONG TERM GOALS: Target date: 01/26/24  Ind with a HEP.  Goal status: IN PROGRESS  2.  Improve TUG by 4-5 seconds.   5/14: same as baseline Goal status: IN PROGRESS  3.  Improve 5 time sit to stand by 5 seconds.   5/14: same as baseline Goal status: IN PROGRESS  4.  Walk 500 feet with supervision only.  Goal status: IN PROGRESS   PLAN:  PT FREQUENCY: 2x/week  PT DURATION: 6 weeks  PLANNED INTERVENTIONS: 97110-Therapeutic exercises, 97530- Therapeutic activity, [id number]- Neuromuscular re-education, 97535- Self Care, and 98119- Manual therapy  PLAN FOR NEXT SESSION: Nustep, gait and balance activities.  Strengthening exercises.     Deryl Flora, PTA 01/14/2024, 2:27 PM

## 2024-01-16 DIAGNOSIS — J3081 Allergic rhinitis due to animal (cat) (dog) hair and dander: Secondary | ICD-10-CM | POA: Diagnosis not present

## 2024-01-16 DIAGNOSIS — J301 Allergic rhinitis due to pollen: Secondary | ICD-10-CM | POA: Diagnosis not present

## 2024-01-16 DIAGNOSIS — J3089 Other allergic rhinitis: Secondary | ICD-10-CM | POA: Diagnosis not present

## 2024-01-19 ENCOUNTER — Encounter: Admitting: Physical Therapy

## 2024-01-20 ENCOUNTER — Ambulatory Visit: Attending: Physician Assistant

## 2024-01-20 DIAGNOSIS — R2681 Unsteadiness on feet: Secondary | ICD-10-CM | POA: Insufficient documentation

## 2024-01-20 NOTE — Therapy (Signed)
 OUTPATIENT PHYSICAL THERAPY LOWER EXTREMITY TREATMENT   Patient Name: Kristi Willis MRN: [id number] DOB:March 21, 1941, 83 y.o., female Today's Date: 01/20/2024  END OF SESSION:  PT End of Session - 01/20/24 1348     Visit Number 9    Number of Visits 12    Authorization - Number of Visits 10    PT Start Time 1345    PT Stop Time 1426    PT Time Calculation (min) 41 min    Activity Tolerance Patient tolerated treatment well    Behavior During Therapy WFL for tasks assessed/performed                 Past Medical History:  Diagnosis Date   Allergy     Asthma    Cancer (HCC)    skin   Diabetes mellitus    GERD (gastroesophageal reflux disease)    hiatal hernia   Hypercholesteremia    Hypertension    Neuromuscular disorder (HCC)    DM neuropathy   Neuropathy    SVT (supraventricular tachycardia) (HCC)    Past Surgical History:  Procedure Laterality Date   BREAST BIOPSY     left breast biopsy   LUMBAR LAMINECTOMY  08/10   SKIN CANCER EXCISION  2005   SPINE SURGERY     lumbar laminectomy   Patient Active Problem List   Diagnosis Date Noted   Weakness 11/19/2023   Hypoxia 11/19/2023   Allergic rhinitis 03/05/2022   Pulmonary congestion 02/11/2021   Aortic atherosclerosis (HCC) 02/11/2021   Hypo-osmolar hyponatremia 02/10/2021   Primary progressive aphasia (HCC) 03/23/2020   Pain in left shoulder 11/03/2019   Acute metabolic encephalopathy 08/05/2019   Iron deficiency anemia 08/05/2019   QT prolongation 08/05/2019   Hypothyroidism 02/12/2019   Speech articulation disorder 09/01/2018   Essential hypertension 10/03/2017   GAD (generalized anxiety disorder) 07/03/2017   Diabetes mellitus (HCC) 09/27/2016   Depression 03/29/2016   Insomnia 07/18/2015   Vitamin D  deficiency 10/11/2013   Right carotid bruit 03/02/2013   Degenerative disc disease, lumbar 11/27/2012   Asthma 11/13/2010   Hyperlipidemia associated with type 2 diabetes mellitus (HCC)  11/13/2010   Cancer of skin 11/13/2010   Diabetic neuropathy (HCC) 11/13/2010   Wide-complex tachycardia==Atrial Tachycardia 11/13/2010    REFERRING PROVIDER: Tommas Fragmin  REFERRING DIAG: Gait instability.  THERAPY DIAG:  Unsteadiness on feet  Rationale for Evaluation and Treatment: Rehabilitation  ONSET DATE: Ongoing.   SUBJECTIVE:   SUBJECTIVE STATEMENT: Subjective report unable to be obtained secondary to aphasia.   PERTINENT HISTORY: Please see above.   PAIN:  Are you having pain? No  PRECAUTIONS: Fall.  Please be with patient at all times.    WEIGHT BEARING RESTRICTIONS: No  FALLS:  Has patient fallen in last 6 months? Yes. Number of falls 2.  LIVING ENVIRONMENT: Lives with: lives alone Lives in: House/apartment Has following equipment at home: None  OCCUPATION: Retired.    PLOF: Independent with basic ADLs  PATIENT GOALS: Improve mobility.   OBJECTIVE:  Note: Objective measures were completed at Evaluation unless otherwise noted.  POSTURE: rounded shoulders and forward head  LOWER EXTREMITY ROM:  WFL.  LOWER EXTREMITY MMT:  Patient able to provide a normal strength grade via manual muscle testing to bilateral hip and knees.  FUNCTIONAL TESTS:  5 times sit to stand: 22 seconds. Timed up and go (TUG): 20 seconds  GAIT: The patient's gait is slow and purposeful with a wide base of support.  Assessed stairs with ascent and  descent performed with a railing and CGA.  Decrease step and stride length.                                                                                                                                 TREATMENT DATE:                                     01/20/24 EXERCISE LOG  Exercise Repetitions and Resistance Comments  Nustep  L4 x 20 minutes   Goal assessment See goal section   Seated HS curl  Green t-band x 30 reps each    Seated hip ADD isometric  3 minutes w/ 5 second hold   LAQ  3# x 30 reps each    Seated  march  3# x 20 reps each  Alternating LE   Blank cell = exercise not performed today   01/14/24    EXERCISE LOG  Exercise Repetitions and Resistance Comments  Nustep  L4 x 20 minutes   Standing heel/toe raises  25 reps each   Standing Hip Abduction 25 reps bil   Standing Hip Flexion 25 reps bil   Seated HS curls  Green t-band x 30 reps each    Seated hip ADD isometric  3 minutes w/ 5 second hold   LAQ 3# x 20 reps each    Seated marching  3# x 1 minutes     Standing marching 3 minutes   Standing Ball Toss    Sit to stand  12 reps  With UE support from armrests   Blank cell = exercise not performed today   12/31/23    EXERCISE LOG  Exercise Repetitions and Resistance Comments  Nustep  L4 x 20 minutes   LAQs 2# x 25 reps bil   Seated Marches 2# x 25 reps bil   Seated heel/toe raises     Seated HS curls  Green x 20 reps bil   Seated clams  Green x 20 reps   Seated hip ADD isometric     Gait training     Blank cell = exercise not performed today   PATIENT EDUCATION:  Education details:  Person educated:  International aid/development worker:  Education comprehension:   HOME EXERCISE PROGRAM:   ASSESSMENT:  CLINICAL IMPRESSION: Patient has made minimal progress with skilled physical therapy as evidenced by her objective measures, functional mobility, and progress toward her goals. She was able to demonstrate a slight improvement in her five time sit to stand time. However, her timed up and go time has regressed slightly since her initial evaluation. Today's treatment focused on her familiar interventions for improved lower extremity muscular strength and stability needed for activities such as transfers. She will be discharged at her next appointment with an updated plan of care.   OBJECTIVE IMPAIRMENTS: Abnormal gait and decreased activity tolerance.  ACTIVITY LIMITATIONS: carrying, lifting, and locomotion level  PARTICIPATION LIMITATIONS: meal prep, cleaning, laundry, and yard  work  PERSONAL FACTORS: 1-2 comorbidities: diabetic neuropathy, primary progressive aphasia are also affecting patient's functional outcome.   REHAB POTENTIAL: Good minus  CLINICAL DECISION MAKING: Evolving/moderate complexity  EVALUATION COMPLEXITY: Moderate   GOALS:  LONG TERM GOALS: Target date: 01/26/24  Ind with a HEP.  Goal status: IN PROGRESS  2.  Improve TUG by 4-5 seconds.   5/14: same as baseline  01/20/24: 23.77 seconds (3.77 second regression) Goal status: IN PROGRESS  3.  Improve 5 time sit to stand by 5 seconds.   5/14: same as baseline  01/20/24: 17.43 seconds (4.57 second improvement)  Goal status: IN PROGRESS  4.  Walk 500 feet with supervision only.  Goal status: MET   PLAN:  PT FREQUENCY: 2x/week  PT DURATION: 6 weeks  PLANNED INTERVENTIONS: 97110-Therapeutic exercises, 97530- Therapeutic activity, [id number]- Neuromuscular re-education, 97535- Self Care, and 13086- Manual therapy  PLAN FOR NEXT SESSION: Nustep, gait and balance activities.  Strengthening exercises.     Lane Pinon, PT 01/20/2024, 3:49 PM

## 2024-01-21 ENCOUNTER — Encounter

## 2024-01-23 DIAGNOSIS — J3081 Allergic rhinitis due to animal (cat) (dog) hair and dander: Secondary | ICD-10-CM | POA: Diagnosis not present

## 2024-01-23 DIAGNOSIS — J301 Allergic rhinitis due to pollen: Secondary | ICD-10-CM | POA: Diagnosis not present

## 2024-01-23 DIAGNOSIS — J3089 Other allergic rhinitis: Secondary | ICD-10-CM | POA: Diagnosis not present

## 2024-01-26 ENCOUNTER — Ambulatory Visit

## 2024-01-26 DIAGNOSIS — R2681 Unsteadiness on feet: Secondary | ICD-10-CM

## 2024-01-26 NOTE — Therapy (Addendum)
 OUTPATIENT PHYSICAL THERAPY LOWER EXTREMITY TREATMENT   Patient Name: Kristi Willis MRN: [id number] DOB:May 15, 1941, 83 y.o., female Today's Date: 01/26/2024  END OF SESSION:  PT End of Session - 01/26/24 1302     Visit Number 10    Number of Visits 12    Authorization - Number of Visits 10    PT Start Time 1300    PT Stop Time 1340    PT Time Calculation (min) 40 min    Activity Tolerance Patient tolerated treatment well    Behavior During Therapy WFL for tasks assessed/performed                 Past Medical History:  Diagnosis Date   Allergy     Asthma    Cancer (HCC)    skin   Diabetes mellitus    GERD (gastroesophageal reflux disease)    hiatal hernia   Hypercholesteremia    Hypertension    Neuromuscular disorder (HCC)    DM neuropathy   Neuropathy    SVT (supraventricular tachycardia) (HCC)    Past Surgical History:  Procedure Laterality Date   BREAST BIOPSY     left breast biopsy   LUMBAR LAMINECTOMY  08/10   SKIN CANCER EXCISION  2005   SPINE SURGERY     lumbar laminectomy   Patient Active Problem List   Diagnosis Date Noted   Weakness 11/19/2023   Hypoxia 11/19/2023   Allergic rhinitis 03/05/2022   Pulmonary congestion 02/11/2021   Aortic atherosclerosis (HCC) 02/11/2021   Hypo-osmolar hyponatremia 02/10/2021   Primary progressive aphasia (HCC) 03/23/2020   Pain in left shoulder 11/03/2019   Acute metabolic encephalopathy 08/05/2019   Iron deficiency anemia 08/05/2019   QT prolongation 08/05/2019   Hypothyroidism 02/12/2019   Speech articulation disorder 09/01/2018   Essential hypertension 10/03/2017   GAD (generalized anxiety disorder) 07/03/2017   Diabetes mellitus (HCC) 09/27/2016   Depression 03/29/2016   Insomnia 07/18/2015   Vitamin D  deficiency 10/11/2013   Right carotid bruit 03/02/2013   Degenerative disc disease, lumbar 11/27/2012   Asthma 11/13/2010   Hyperlipidemia associated with type 2 diabetes mellitus (HCC)  11/13/2010   Cancer of skin 11/13/2010   Diabetic neuropathy (HCC) 11/13/2010   Wide-complex tachycardia==Atrial Tachycardia 11/13/2010    REFERRING PROVIDER: Tommas Fragmin  REFERRING DIAG: Gait instability.  THERAPY DIAG:  Unsteadiness on feet  Rationale for Evaluation and Treatment: Rehabilitation  ONSET DATE: Ongoing.   SUBJECTIVE:   SUBJECTIVE STATEMENT: Subjective report unable to be obtained secondary to aphasia.   PERTINENT HISTORY: Please see above.   PAIN:  Are you having pain? No  PRECAUTIONS: Fall.  Please be with patient at all times.    WEIGHT BEARING RESTRICTIONS: No  FALLS:  Has patient fallen in last 6 months? Yes. Number of falls 2.  LIVING ENVIRONMENT: Lives with: lives alone Lives in: House/apartment Has following equipment at home: None  OCCUPATION: Retired.    PLOF: Independent with basic ADLs  PATIENT GOALS: Improve mobility.   OBJECTIVE:  Note: Objective measures were completed at Evaluation unless otherwise noted.  POSTURE: rounded shoulders and forward head  LOWER EXTREMITY ROM:  WFL.  LOWER EXTREMITY MMT:  Patient able to provide a normal strength grade via manual muscle testing to bilateral hip and knees.  FUNCTIONAL TESTS:  5 times sit to stand: 22 seconds. Timed up and go (TUG): 20 seconds  GAIT: The patient's gait is slow and purposeful with a wide base of support.  Assessed stairs with ascent and  descent performed with a railing and CGA.  Decrease step and stride length.                                                                                                                                 TREATMENT DATE:                                     01/26/24 EXERCISE LOG  Exercise Repetitions and Resistance Comments  Nustep  L4 x 20 minutes   Standing heel/toe raises 30 reps each   Standing Hip Abduction 30 reps bil    Seated HS curl  Green t-band x 30 reps each    Seated hip ADD isometric  3 minutes w/ 5 second  hold   LAQ  3# x 30 reps each    Seated march  3# x 20 reps each  Alternating LE   Blank cell = exercise not performed today   01/14/24    EXERCISE LOG  Exercise Repetitions and Resistance Comments  Nustep  L4 x 20 minutes   Standing heel/toe raises  25 reps each   Standing Hip Abduction 25 reps bil   Standing Hip Flexion 25 reps bil   Seated HS curls  Green t-band x 30 reps each    Seated hip ADD isometric  3 minutes w/ 5 second hold   LAQ 3# x 20 reps each    Seated marching  3# x 1 minutes     Standing marching 3 minutes   Standing Ball Toss    Sit to stand  12 reps  With UE support from armrests   Blank cell = exercise not performed today   12/31/23    EXERCISE LOG  Exercise Repetitions and Resistance Comments  Nustep  L4 x 20 minutes   LAQs 2# x 25 reps bil   Seated Marches 2# x 25 reps bil   Seated heel/toe raises     Seated HS curls  Green x 20 reps bil   Seated clams  Green x 20 reps   Seated hip ADD isometric     Gait training     Blank cell = exercise not performed today   PATIENT EDUCATION:  Education details:  Person educated:  International aid/development worker:  Education comprehension:   HOME EXERCISE PROGRAM:   ASSESSMENT:  CLINICAL IMPRESSION: Patient arrives today in good spirits and without pain.  All of pt's goals checked at last treatment session with progress noted below.  Pt able to tolerate increased reps with standing exercises today with min cues and demonstration for proper technique and posture.  Pt and pt's daughter encouraged to call the facility with any questions or concerns.  Pt ready for discharge at this time.  PHYSICAL THERAPY DISCHARGE SUMMARY  Visits from Start of Care: 10  Current functional level related to goals /  functional outcomes: Patient was able to partially meet her goals for physical therapy.    Remaining deficits: Lower extremity power and gait speed    Education / Equipment: HEP    Patient agrees to discharge. Patient goals  were partially met. Patient is being discharged due to being pleased with the current functional level.  Glendora Landsman, PT, DPT    OBJECTIVE IMPAIRMENTS: Abnormal gait and decreased activity tolerance.   ACTIVITY LIMITATIONS: carrying, lifting, and locomotion level  PARTICIPATION LIMITATIONS: meal prep, cleaning, laundry, and yard work  PERSONAL FACTORS: 1-2 comorbidities: diabetic neuropathy, primary progressive aphasia are also affecting patient's functional outcome.   REHAB POTENTIAL: Good minus  CLINICAL DECISION MAKING: Evolving/moderate complexity  EVALUATION COMPLEXITY: Moderate   GOALS:  LONG TERM GOALS: Target date: 01/26/24  Ind with a HEP.  Goal status: IN PROGRESS  2.  Improve TUG by 4-5 seconds.   5/14: same as baseline  01/20/24: 23.77 seconds (3.77 second regression) Goal status: IN PROGRESS  3.  Improve 5 time sit to stand by 5 seconds.   5/14: same as baseline  01/20/24: 17.43 seconds (4.57 second improvement)  Goal status: IN PROGRESS  4.  Walk 500 feet with supervision only.  Goal status: MET   PLAN:  PT FREQUENCY: 2x/week  PT DURATION: 6 weeks  PLANNED INTERVENTIONS: 97110-Therapeutic exercises, 97530- Therapeutic activity, [id number]- Neuromuscular re-education, 97535- Self Care, and 16109- Manual therapy  PLAN FOR NEXT SESSION: Nustep, gait and balance activities.  Strengthening exercises.     Deryl Flora, PTA 01/26/2024, 1:42 PM

## 2024-01-28 ENCOUNTER — Encounter

## 2024-01-30 DIAGNOSIS — J3089 Other allergic rhinitis: Secondary | ICD-10-CM | POA: Diagnosis not present

## 2024-01-30 DIAGNOSIS — J301 Allergic rhinitis due to pollen: Secondary | ICD-10-CM | POA: Diagnosis not present

## 2024-01-30 DIAGNOSIS — J3081 Allergic rhinitis due to animal (cat) (dog) hair and dander: Secondary | ICD-10-CM | POA: Diagnosis not present

## 2024-02-02 ENCOUNTER — Encounter

## 2024-02-06 DIAGNOSIS — J3081 Allergic rhinitis due to animal (cat) (dog) hair and dander: Secondary | ICD-10-CM | POA: Diagnosis not present

## 2024-02-06 DIAGNOSIS — J301 Allergic rhinitis due to pollen: Secondary | ICD-10-CM | POA: Diagnosis not present

## 2024-02-06 DIAGNOSIS — J3089 Other allergic rhinitis: Secondary | ICD-10-CM | POA: Diagnosis not present

## 2024-02-16 ENCOUNTER — Other Ambulatory Visit: Payer: Self-pay | Admitting: Family

## 2024-02-16 DIAGNOSIS — E039 Hypothyroidism, unspecified: Secondary | ICD-10-CM

## 2024-02-16 DIAGNOSIS — I10 Essential (primary) hypertension: Secondary | ICD-10-CM

## 2024-02-26 DIAGNOSIS — M79675 Pain in left toe(s): Secondary | ICD-10-CM | POA: Diagnosis not present

## 2024-02-26 DIAGNOSIS — E1142 Type 2 diabetes mellitus with diabetic polyneuropathy: Secondary | ICD-10-CM | POA: Diagnosis not present

## 2024-02-26 DIAGNOSIS — M79674 Pain in right toe(s): Secondary | ICD-10-CM | POA: Diagnosis not present

## 2024-02-26 DIAGNOSIS — B351 Tinea unguium: Secondary | ICD-10-CM | POA: Diagnosis not present

## 2024-02-26 DIAGNOSIS — L84 Corns and callosities: Secondary | ICD-10-CM | POA: Diagnosis not present

## 2024-02-27 DIAGNOSIS — J301 Allergic rhinitis due to pollen: Secondary | ICD-10-CM | POA: Diagnosis not present

## 2024-02-27 DIAGNOSIS — J3089 Other allergic rhinitis: Secondary | ICD-10-CM | POA: Diagnosis not present

## 2024-02-27 DIAGNOSIS — J3081 Allergic rhinitis due to animal (cat) (dog) hair and dander: Secondary | ICD-10-CM | POA: Diagnosis not present

## 2024-03-05 DIAGNOSIS — J3081 Allergic rhinitis due to animal (cat) (dog) hair and dander: Secondary | ICD-10-CM | POA: Diagnosis not present

## 2024-03-05 DIAGNOSIS — J301 Allergic rhinitis due to pollen: Secondary | ICD-10-CM | POA: Diagnosis not present

## 2024-03-05 DIAGNOSIS — J3089 Other allergic rhinitis: Secondary | ICD-10-CM | POA: Diagnosis not present

## 2024-03-09 ENCOUNTER — Telehealth

## 2024-03-09 ENCOUNTER — Encounter (HOSPITAL_BASED_OUTPATIENT_CLINIC_OR_DEPARTMENT_OTHER): Payer: Self-pay | Admitting: *Deleted

## 2024-03-09 ENCOUNTER — Other Ambulatory Visit: Payer: Self-pay

## 2024-03-09 ENCOUNTER — Ambulatory Visit: Payer: Self-pay

## 2024-03-09 ENCOUNTER — Telehealth: Payer: Self-pay | Admitting: Family

## 2024-03-09 ENCOUNTER — Emergency Department (HOSPITAL_BASED_OUTPATIENT_CLINIC_OR_DEPARTMENT_OTHER)
Admission: EM | Admit: 2024-03-09 | Discharge: 2024-03-09 | Disposition: A | Discharge status: Home / Self Care | Source: Ambulatory Visit | Attending: Emergency Medicine | Admitting: Emergency Medicine

## 2024-03-09 DIAGNOSIS — E119 Type 2 diabetes mellitus without complications: Secondary | ICD-10-CM | POA: Insufficient documentation

## 2024-03-09 DIAGNOSIS — R220 Localized swelling, mass and lump, head: Secondary | ICD-10-CM | POA: Insufficient documentation

## 2024-03-09 DIAGNOSIS — Z79899 Other long term (current) drug therapy: Secondary | ICD-10-CM | POA: Diagnosis not present

## 2024-03-09 DIAGNOSIS — I10 Essential (primary) hypertension: Secondary | ICD-10-CM | POA: Diagnosis not present

## 2024-03-09 DIAGNOSIS — Z7984 Long term (current) use of oral hypoglycemic drugs: Secondary | ICD-10-CM | POA: Diagnosis not present

## 2024-03-09 DIAGNOSIS — Z7982 Long term (current) use of aspirin: Secondary | ICD-10-CM | POA: Diagnosis not present

## 2024-03-09 MED ORDER — FAMOTIDINE 20 MG PO TABS: 20.0000 mg | ORAL_TABLET | Freq: Two times a day (BID) | ORAL | 2 refills | Status: DC

## 2024-03-09 NOTE — Telephone Encounter (Signed)
 FYI Only or Action Required?: Action required by provider: ED refusal, requesting appointment.  Patient was last seen in primary care on 12/26/2023 by Cathlene Marry Lenis, FNP.  Called Nurse Triage reporting Oral Swelling.  Symptoms began today.  Interventions attempted: OTC medications: Benadryl x2.  Symptoms are: severe lip swelling, facial swelling from nose down stable.  Triage Disposition: Go to ED Now (or PCP Triage)  Patient/caregiver understands and will follow disposition?: No, refuses disposition            Copied from CRM [id number]. Topic: Clinical - Red Word Triage >> Mar 09, 2024  7:45 AM Essie A wrote: Red Word that prompted transfer to Nurse Triage: Allergic reaction, has happened in the past with possible allergic Lisinipril. Not taking anymore. Mouth is swollen.  Daughter Randall is calling. Reason for Disposition  [1] Severe swelling AND [2] cause unknown  Answer Assessment - Initial Assessment Questions Daughter, Randall, on phone for triage. She denies any difficulty breathing, tongue swelling, drooling, difficulty swallowing, labored breathing, rash/hives. She states the patient has been off lisinopril  since June and she fixes her medications for her so she knows she did not take any. Daughter states she has treated with giving patient 2 Benadryl. ED recommended for angioedema, daughter refused and requesting to speak to office. Called [facility], transferred to [place].  1. ONSET: When did the swelling start? (e.g., minutes, hours, days)     This morning around 0600.  2. SEVERITY: How swollen is it? (e.g., part of an upper or lower lip, both lips)     Both upper and lower lips; severe she states they look really tight.  3. ITCHING: Is there any itching? If Yes, ask: How much?   (Scale 1-10; mild, moderate or severe)     No.  4. PAIN: Is the swelling painful to touch? If Yes, ask: How painful is it?   (Scale 1-10; mild, moderate or severe)      Unsure.  5. CAUSE: What do you think is causing the lip swelling? (e.g., certain food or medicine, recent cosmetic treatment)     Unsure. Daughter denies any new medications patient has been started on.  6. RECURRENT SYMPTOM: Have you had lip swelling before? If Yes, ask: When was the last time? What happened that time?     Yes, last episode was June 27th. Patient stopped lisinopril .  7. OTHER SYMPTOMS: Do you have any other symptoms? (e.g., fever, toothache)     Facial swelling from nose down face,  8. PREGNANCY: Is there any chance you are pregnant? When was your last menstrual period?     N/A.  Protocols used: Lip Swelling-A-AH

## 2024-03-09 NOTE — Discharge Instructions (Signed)
 Would recommend continuing the Benadryl 25 mg every 6 hours.  And also adding on Pepcid  twice a day.  Also recommend following back up with the allergist.  Not sure if they are able to rule in or rule out whether this is lisinopril  related.  Reassuring has been going on since May.  No evidence of any acute emergency situation here today.  Return for tongue swelling difficulty breathing.

## 2024-03-09 NOTE — Telephone Encounter (Signed)
 After talking to Rosaline Bruns, FNP, I called Randall back to advise that Rosaline recommends patient go to the emergency room and should have been taken when symptoms started. Randall states I could take her to the ED, that's not the problem. I have an epi-pen and I've been on the phone with the receptionist at the allergy  clinic and they are saying that its an allergic reaction to the lisinopril  that she stopped taking in June, they said the reactions could keep happening for 6 months. I strongly advised Randall that even if she were to use the epi-pen at home she would still have to take patient to the emergency room. Told Randall that since patient is nonverbal and having an allergic reaction with facial swelling and signs of discomfort, a video visit is not appropriate, to take patient to ED now. Randall agreed stating that she would drive patient to the emergency room.

## 2024-03-09 NOTE — Telephone Encounter (Signed)
 Daughter Randall transferred over to me this morning stating that patient is having an allergic reaction to something but unsure what. Randall states that patient is swollen from the nose down, no shortness of breath, but patient looks to be scared. Randall states that the patient us  nonverbal and that is what she is worried about since the patient can not tell her if she is having trouble breathing. I told Randall that patient needs to go to the emergency room, Randall stated that she did not understand why everyone was telling her that because what are they going to do for her?. I told Randall that they could perform life saving measures if she were to go into anaphylactic shock, Randall refused and stated oh she's fine, she has an epi-pen. I just need to get her scheduled for a video visit. Video visit scheduled with DOD per [facility] request.

## 2024-03-09 NOTE — ED Triage Notes (Signed)
 Pt to ED reporting recurrent facial swelling since yesterday. Pt had facial swelling in June that PCP thought was related to her lisinopril . Lisinopril  stopped but swelling has started again yesterday. Swelling decreased after Benadryl taken at home.   Hx of Primary Progressive asphasia pt is nonverbal.

## 2024-03-09 NOTE — ED Provider Notes (Signed)
  EMERGENCY DEPARTMENT AT [facility] Provider Note   CSN: [id number] Arrival date & time: 03/09/24  9070     Patient presents with: Facial Swelling   Kristi Willis is a 83 y.o. female.   Patient has been struggling with intermittent facial swelling going back to May.  Family initially thought maybe was an allergic reaction to some flowers.  Patient is known to have several allergies.  Is followed by an allergist.  They concluded that they thought maybe this was secondary to lisinopril0 lisinopril  has been halted actually several weeks ago.  But the facial swelling is reoccurring no redness with it no hives no tongue swelling occasional lip swelling no difficulty breathing.  They do have an EpiPen at home.  Allergist that she is followed by felt lisinopril  was the cause.  They have noticed that Benadryl seems to help a little bit.  But does not completely resolve it.  Patient has not been on Pepcid .  Patient has not had any allergy  testing for several months.  Past medical history is significant for aphasia also does not understand things verbally things have to be written down due to a neurologic condition nonstroke has been getting slowly worse now for several years.  Past medical history otherwise significant for supraventricular tachycardia hypertension diabetes high cholesterol the neuromuscular disorder.  As listed gastroesophageal reflux disease but patient does not have any medications based on her med list that she is taking for that.         Prior to Admission medications   Medication Sig Start Date End Date Taking? Authorizing Provider  Accu-Chek Softclix Lancets lancets CHECK BLOOD SUGAR ONCE DAILY OR AS DIRECTED Dx E11.40 11/23/20   Lavell Lye A, FNP  acyclovir  ointment (ZOVIRAX ) 5 % APPLY TO THE AFFECTED AREA AROUND MOUTH EVERY 3 HOURS AS DIRECTED UNTIL CLEAR 05/31/19   Hawks, Christy A, FNP  albuterol  (VENTOLIN  HFA) 108 (90 Base) MCG/ACT inhaler  INHALE TWO PUFFS EVERY 6 HOURS AS NEEDED FOR WHEEZING OR SHORTNESS OF BREATH 09/08/23   Lavell Lye A, FNP  amLODipine  (NORVASC ) 5 MG tablet TAKE ONE TABLET BY MOUTH DAILY 12/11/23   Lavell Lye A, FNP  aspirin  81 MG chewable tablet Chew 81 mg by mouth at bedtime.    [provider]  azelastine  (ASTELIN ) 0.1 % nasal spray Place 2 sprays into both nostrils 2 (two) times daily. 08/24/14   Pennie Elsie PARAS, FNP  budesonide  (PULMICORT ) 0.25 MG/2ML nebulizer solution Take 2 mLs (0.25 mg total) by nebulization 2 (two) times daily. 11/23/23   Regalado, Belkys A, MD  cetirizine  (ZYRTEC ) 10 MG tablet TAKE ONE TABLET DAILY 04/09/23   Lavell Lye A, FNP  Coenzyme Q10 (COQ10 PO) Take 1 tablet by mouth daily.    [provider]  cyanocobalamin  (VITAMIN B12) 500 MCG tablet Take 1 tablet (500 mcg total) by mouth daily. 11/24/23   Regalado, Belkys A, MD  doxazosin  (CARDURA ) 8 MG tablet TAKE ONE TABLET DAILY FOR BLOOD PRESSURE 02/17/24   Hawks, Christy A, FNP  EPIPEN 2-PAK 0.3 MG/0.3ML SOAJ injection Inject 0.3 mg into the muscle as needed for anaphylaxis. Reported on 01/24/2016 03/07/14   [provider]  escitalopram  (LEXAPRO ) 20 MG tablet TAKE ONE TABLET BY MOUTH DAILY 12/11/23   Lavell Lye A, FNP  fluticasone (FLONASE) 50 MCG/ACT nasal spray Place 1 spray into both nostrils daily. 09/09/23   [provider]  furosemide  (LASIX ) 20 MG tablet Take 1 tablet (20 mg total) by mouth  daily. 11/28/23   Lavell Bari LABOR, FNP  furosemide  (LASIX ) 40 MG tablet Take 1 tablet (40 mg total) by mouth daily for 7 days. 11/23/23 11/30/23  Regalado, Belkys A, MD  glucose blood (ACCU-CHEK AVIVA PLUS) test strip CHECK BLOOD SUGAR ONCE DAILY OR AS DIRECTED Dx E11.40 11/23/20   Lavell Bari A, FNP  guaiFENesin  (MUCINEX ) 600 MG 12 hr tablet Take 2 tablets (1,200 mg total) by mouth 2 (two) times daily. 11/23/23   Regalado, Belkys A, MD  ipratropium-albuterol  (DUONEB) 0.5-2.5 (3) MG/3ML SOLN Take 3 mLs by  nebulization every 6 (six) hours. 11/23/23   Regalado, Belkys A, MD  JANUVIA  50 MG tablet TAKE ONE TABLET BY MOUTH DAILY 01/13/24   Lavell Bari A, FNP  ketoconazole  (NIZORAL ) 2 % cream Apply 1 application topically daily. 05/08/21   Lavell Bari LABOR, FNP  levothyroxine  (SYNTHROID ) 75 MCG tablet TAKE ONE TABLET DAILY BEFORE BREAKFAST 02/17/24   Lavell Bari A, FNP  lisinopril  (ZESTRIL ) 40 MG tablet TAKE ONE TABLET DAILY 12/11/23   Lavell Bari A, FNP  metFORMIN  (GLUCOPHAGE ) 1000 MG tablet TAKE ONE TABLET TWICE DAILY WITH MEAL(S) 12/11/23   Lavell Bari LABOR, FNP  metoprolol  succinate (TOPROL -XL) 100 MG 24 hr tablet TAKE ONE TABLET DAILY 12/11/23   Lavell Bari A, FNP  montelukast  (SINGULAIR ) 10 MG tablet TAKE ONE TABLET AT BEDTIME 12/11/23   Lavell Bari A, FNP  rosuvastatin  (CRESTOR ) 20 MG tablet TAKE ONE TABLET AT BEDTIME 12/11/23   Hawks, Bari A, FNP    Allergies: Dimetapp c [phenylephrine-bromphen-codeine], Phenylephrine hcl, Shingrix [zoster vac recomb adjuvanted], Ultram [tramadol hcl], Celebrex [celecoxib], Cortisone, Fenofibrate, and Sulfa antibiotics    Review of Systems  Constitutional:  Negative for chills and fever.  HENT:  Positive for facial swelling. Negative for ear pain, sore throat and trouble swallowing.   Eyes:  Negative for pain and visual disturbance.  Respiratory:  Negative for cough and shortness of breath.   Cardiovascular:  Negative for chest pain and palpitations.  Gastrointestinal:  Negative for abdominal pain and vomiting.  Genitourinary:  Negative for dysuria and hematuria.  Musculoskeletal:  Negative for arthralgias and back pain.  Skin:  Negative for color change and rash.  Neurological:  Positive for speech difficulty. Negative for seizures and syncope.  All other systems reviewed and are negative.   Updated Vital Signs BP (!) 154/57 (BP Location: Right Arm)   Pulse (!) 59   Temp 98 F (36.7 C)   Resp 16   SpO2 98%   Physical Exam Vitals and  nursing note reviewed.  Constitutional:      General: She is not in acute distress.    Appearance: Normal appearance. She is well-developed.  HENT:     Head: Normocephalic and atraumatic.     Comments: There is some facial swelling both sides of the face a little bit more to the left no erythema.  No hives.  No lower lip swelling.  There is a little bit of swelling to the left upper lip area.    Mouth/Throat:     Mouth: Mucous membranes are moist.     Pharynx: Oropharynx is clear.     Comments: No tongue swelling.  No swelling to the floor the mouth.  There is some swelling to the left side of the upper lip. Eyes:     Extraocular Movements: Extraocular movements intact.     Conjunctiva/sclera: Conjunctivae normal.     Pupils: Pupils are equal, round, and reactive to light.  Cardiovascular:  Rate and Rhythm: Normal rate and regular rhythm.     Heart sounds: No murmur heard. Pulmonary:     Effort: Pulmonary effort is normal. No respiratory distress.     Breath sounds: Normal breath sounds. No stridor. No wheezing, rhonchi or rales.  Abdominal:     Palpations: Abdomen is soft.     Tenderness: There is no abdominal tenderness.  Musculoskeletal:        General: No swelling.     Cervical back: Neck supple.     Right lower leg: No edema.     Left lower leg: No edema.  Skin:    General: Skin is warm and dry.     Capillary Refill: Capillary refill takes less than 2 seconds.     Findings: No erythema or rash.     Comments: On the back over the left shoulder right shoulder and down around the lumbar part of the back there is a bit of a dermatitis.  Mostly scabbed.  No vesicles not in a dermatomal pattern.  Not hive-like at all.  Do not think it is related to the facial swelling.  Neurological:     Mental Status: She is alert. Mental status is at baseline.  Psychiatric:        Mood and Affect: Mood normal.     (all labs ordered are listed, but only abnormal results are  displayed) Labs Reviewed - No data to display  EKG: None  Radiology: No results found.   Procedures   Medications Ordered in the ED - No data to display                                  Medical Decision Making  Patient nontoxic no acute distress.  No tongue swelling.  A little bit of facial swelling but no erythema no hives no wheezing not sure if this is truly an allergic reaction allergist that she is followed by thinks it is all secondary to the lisinopril  that has been stopped.  It can be delayed or continued for a while.  Patient is trying Benadryl.  Would recommend maybe trying Pepcid .  I do not think course of steroids is appropriate at this time it will bump her blood sugars up.  Also this been going on since May.  Also I do not think that this is infectious in nature.  Will recommend she continue the Benadryl try Pepcid  and follow-up with her allergist.  They do have an EpiPen at home.  Precautions provided.  Final diagnoses:  Facial swelling    ED Discharge Orders     None          Geraldene Hamilton, MD 03/09/24 1017

## 2024-03-09 NOTE — Telephone Encounter (Signed)
 Ok thanks for the update

## 2024-03-10 ENCOUNTER — Ambulatory Visit: Payer: Medicare PPO

## 2024-03-10 ENCOUNTER — Other Ambulatory Visit: Payer: Self-pay | Admitting: Family

## 2024-03-10 VITALS — BP 127/47 | HR 65 | Ht 64.0 in | Wt 153.0 lb

## 2024-03-10 DIAGNOSIS — Z0000 Encounter for general adult medical examination without abnormal findings: Secondary | ICD-10-CM

## 2024-03-10 DIAGNOSIS — E1142 Type 2 diabetes mellitus with diabetic polyneuropathy: Secondary | ICD-10-CM

## 2024-03-10 NOTE — Patient Instructions (Signed)
 Kristi Willis , Thank you for taking time out of your busy schedule to complete your Annual Wellness Visit with me. I enjoyed our conversation and look forward to speaking with you again next year. I, as well as your care team,  appreciate your ongoing commitment to your health goals. Please review the following plan we discussed and let me know if I can assist you in the future. Your Game plan/ To Do List    Follow up Visits: Next Medicare AWV with our clinical staff: 03/13/25 at 2:30p.m.   Have you seen your provider in the last 6 months (3 months if uncontrolled diabetes)? No Next Office Visit with your provider: n/a  Clinician Recommendations:  Aim for 30 minutes of exercise or brisk walking, 6-8 glasses of water, and 5 servings of fruits and vegetables each day.       This is a list of the screening recommended for you and due dates:  Health Maintenance  Topic Date Due   Zoster (Shingles) Vaccine (2 of 2) 05/01/2021   Complete foot exam   04/24/2022   Eye exam for diabetics  05/28/2022   COVID-19 Vaccine (5 - 2024-25 season) 04/20/2023   Medicare Annual Wellness Visit  03/09/2024   Flu Shot  03/19/2024   Hemoglobin A1C  05/20/2024   Yearly kidney function blood test for diabetes  11/27/2024   Yearly kidney health urinalysis for diabetes  11/27/2024   DTaP/Tdap/Td vaccine (2 - Td or Tdap) 02/01/2025   Pneumococcal Vaccine for age over 7  Completed   DEXA scan (bone density measurement)  Completed   Hepatitis B Vaccine  Aged Out   HPV Vaccine  Aged Out   Meningitis B Vaccine  Aged Out   Hepatitis C Screening  Discontinued    Advanced directives: (Copy Requested) Please bring a copy of your health care power of attorney and living will to the office to be added to your chart at your convenience. You can mail to [facility] [address] [place], [place] [address] or email to [email or web address] Advance Care Planning is important because it:  [x]  Makes  sure you receive the medical care that is consistent with your values, goals, and preferences  [x]  It provides guidance to your family and loved ones and reduces their decisional burden about whether or not they are making the right decisions based on your wishes.  Follow the link provided in your after visit summary or read over the paperwork we have mailed to you to help you started getting your Advance Directives in place. If you need assistance in completing these, please reach out to us  so that we can help you! See attachments for Preventive Care and Fall Prevention Tips.

## 2024-03-10 NOTE — Progress Notes (Signed)
 Subjective:   Kristi Willis is a 83 y.o. who presents for a Medicare Wellness preventive visit.  As a reminder, Annual Wellness Visits don't include a physical exam, and some assessments may be limited, especially if this visit is performed virtually. We may recommend an in-person follow-up visit with your provider if needed.  Visit Complete: Virtual I connected with  Kristi Willis on 03/10/24 by a audio enabled telemedicine application and verified that I am speaking with the correct person using two identifiers.  Patient Location: Home  Provider Location: Home Office  I discussed the limitations of evaluation and management by telemedicine. The patient expressed understanding and agreed to proceed.  Vital Signs: Because this visit was a virtual/telehealth visit, some criteria may be missing or patient reported. Any vitals not documented were not able to be obtained and vitals that have been documented are patient reported.  VideoDeclined- This patient declined Librarian, academic. Therefore the visit was completed with audio only.  Persons Participating in Visit: Bullins,Kristi Willis (Daughter)   AWV Questionnaire: No: Patient Medicare AWV questionnaire was not completed prior to this visit.  Cardiac Risk Factors include: advanced age (>55men, >48 women);diabetes mellitus;dyslipidemia     Objective:    Today's Vitals   03/10/24 1606  BP: (!) 127/47  Pulse: 65  Weight: 153 lb (69.4 kg)  Height: 5' 4 (1.626 m)   Body mass index is 26.26 kg/m.     03/10/2024    2:44 PM 03/09/2024    9:37 AM 12/15/2023    1:22 PM 12/04/2023   12:47 PM 11/18/2023    4:51 PM 03/10/2023    3:36 PM 12/04/2022    3:01 PM  Advanced Directives  Does Patient Have a Medical Advance Directive? Yes No No No No No No  Type of Media planner of Healthcare Power of Attorney in Chart? No - copy requested        Would patient like  information on creating a medical advance directive?  No - Patient declined  No - Patient declined No - Patient declined Yes (MAU/Ambulatory/Procedural Areas - Information given) No - Patient declined    Current Medications (verified) Outpatient Encounter Medications as of 03/10/2024  Medication Sig   Accu-Chek Softclix Lancets lancets CHECK BLOOD SUGAR ONCE DAILY OR AS DIRECTED Dx E11.40   acyclovir  ointment (ZOVIRAX ) 5 % APPLY TO THE AFFECTED AREA AROUND MOUTH EVERY 3 HOURS AS DIRECTED UNTIL CLEAR   albuterol  (VENTOLIN  HFA) 108 (90 Base) MCG/ACT inhaler INHALE TWO PUFFS EVERY 6 HOURS AS NEEDED FOR WHEEZING OR SHORTNESS OF BREATH   amLODipine  (NORVASC ) 5 MG tablet TAKE ONE TABLET BY MOUTH DAILY   aspirin  81 MG chewable tablet Chew 81 mg by mouth at bedtime.   azelastine  (ASTELIN ) 0.1 % nasal spray Place 2 sprays into both nostrils 2 (two) times daily.   budesonide  (PULMICORT ) 0.25 MG/2ML nebulizer solution Take 2 mLs (0.25 mg total) by nebulization 2 (two) times daily.   cetirizine  (ZYRTEC ) 10 MG tablet TAKE ONE TABLET DAILY   Coenzyme Q10 (COQ10 PO) Take 1 tablet by mouth daily.   cyanocobalamin  (VITAMIN B12) 500 MCG tablet Take 1 tablet (500 mcg total) by mouth daily.   doxazosin  (CARDURA ) 8 MG tablet TAKE ONE TABLET DAILY FOR BLOOD PRESSURE   EPIPEN 2-PAK 0.3 MG/0.3ML SOAJ injection Inject 0.3 mg into the muscle as needed for anaphylaxis. Reported on 01/24/2016   escitalopram  (LEXAPRO ) 20  MG tablet TAKE ONE TABLET BY MOUTH DAILY   famotidine  (PEPCID ) 20 MG tablet Take 1 tablet (20 mg total) by mouth 2 (two) times daily.   fluticasone (FLONASE) 50 MCG/ACT nasal spray Place 1 spray into both nostrils daily.   furosemide  (LASIX ) 40 MG tablet Take 1 tablet (40 mg total) by mouth daily for 7 days.   glucose blood (ACCU-CHEK AVIVA PLUS) test strip CHECK BLOOD SUGAR ONCE DAILY OR AS DIRECTED Dx E11.40   guaiFENesin  (MUCINEX ) 600 MG 12 hr tablet Take 2 tablets (1,200 mg total) by mouth 2 (two) times  daily.   ipratropium-albuterol  (DUONEB) 0.5-2.5 (3) MG/3ML SOLN Take 3 mLs by nebulization every 6 (six) hours.   JANUVIA  50 MG tablet TAKE ONE TABLET BY MOUTH DAILY   ketoconazole  (NIZORAL ) 2 % cream Apply 1 application topically daily.   levothyroxine  (SYNTHROID ) 75 MCG tablet TAKE ONE TABLET DAILY BEFORE BREAKFAST   metFORMIN  (GLUCOPHAGE ) 1000 MG tablet TAKE ONE TABLET TWICE DAILY WITH MEAL(S)   metoprolol  succinate (TOPROL -XL) 100 MG 24 hr tablet TAKE ONE TABLET DAILY   montelukast  (SINGULAIR ) 10 MG tablet TAKE ONE TABLET AT BEDTIME   rosuvastatin  (CRESTOR ) 20 MG tablet TAKE ONE TABLET AT BEDTIME   furosemide  (LASIX ) 20 MG tablet Take 1 tablet (20 mg total) by mouth daily. (Patient not taking: Reported on 03/10/2024)   lisinopril  (ZESTRIL ) 40 MG tablet TAKE ONE TABLET DAILY (Patient not taking: Reported on 03/10/2024)   No facility-administered encounter medications on file as of 03/10/2024.    Allergies (verified) Dimetapp c [phenylephrine-bromphen-codeine], Phenylephrine hcl, Shingrix [zoster vac recomb adjuvanted], Ultram [tramadol hcl], Celebrex [celecoxib], Cortisone, Fenofibrate, and Sulfa antibiotics   History: Past Medical History:  Diagnosis Date   Allergy     Asthma    Cancer (HCC)    skin   Diabetes mellitus    GERD (gastroesophageal reflux disease)    hiatal hernia   Hypercholesteremia    Hypertension    Neuromuscular disorder (HCC)    DM neuropathy   Neuropathy    SVT (supraventricular tachycardia) (HCC)    Past Surgical History:  Procedure Laterality Date   BREAST BIOPSY     left breast biopsy   LUMBAR LAMINECTOMY  08/10   SKIN CANCER EXCISION  2005   SPINE SURGERY     lumbar laminectomy   Family History  Problem Relation Age of Onset   Cancer Mother 20       cervical cancer   Depression Mother    Heart disease Father    Stroke Father    Cancer Father        lung cancer   Heart disease Sister 45       MI   Hypertension Sister    Cancer Sister 65        breast cancer   Healthy Daughter    Social History   Socioeconomic History   Marital status: Widowed    Spouse name: Not on file   Number of children: 1   Years of education: Not on file   Highest education level: 12th grade  Occupational History   Occupation: Retired from [employer]    Comment: retired  Tobacco Use   Smoking status: Never   Smokeless tobacco: Never  Vaping Use   Vaping status: Never Used  Substance and Sexual Activity   Alcohol use: No   Drug use: No   Sexual activity: Not Currently  Other Topics Concern   Not on file  Social History Narrative  Lives home alone - daughter lives about 5 miles away.   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   Social Drivers of Corporate investment banker Strain: Low Risk  (03/10/2024)   Overall Financial Resource Strain (CARDIA)    Difficulty of Paying Living Expenses: Not hard at all  Food Insecurity: No Food Insecurity (03/10/2024)   Hunger Vital Sign    Worried About Running Out of Food in the Last Year: Never true    Ran Out of Food in the Last Year: Never true  Transportation Needs: No Transportation Needs (03/10/2024)   PRAPARE - Administrator, Civil Service (Medical): No    Lack of Transportation (Non-Medical): No  Physical Activity: Inactive (03/10/2024)   Exercise Vital Sign    Days of Exercise per Week: 0 days    Minutes of Exercise per Session: 0 min  Stress: No Stress Concern Present (03/10/2024)   [employer] of Occupational Health - Occupational Stress Questionnaire    Feeling of Stress: Not at all  Social Connections: Socially Isolated (03/10/2024)   Social Connection and Isolation Panel    Frequency of Communication with Friends and  Family: More than three times a week    Frequency of Social Gatherings with Friends and Family: More than three times a week    Attends Religious Services: Never    Database administrator or Organizations: No    Attends Banker Meetings: Never    Marital Status: Widowed    Tobacco Counseling Counseling given: Yes    Clinical Intake:  Pre-visit preparation completed: Yes  Pain : No/denies pain     Nutritional Risks: None Diabetes: Yes CBG done?: No  Lab Results  Component Value Date   HGBA1C 7.1 (H) 11/19/2023   HGBA1C 7.0 (H) 06/12/2023   HGBA1C 7.8 (H) 11/12/2022     How often do you need to have someone help you when you read instructions, pamphlets, or other written materials from your doctor or pharmacy?: 5 - Always (pt daughter)  Interpreter Needed?: No  Comments: awv assisted by daughter due to pt is unable to speakBullins,Kristi Willis (Daughter) Information entered by :: alia t/cma   Activities of Daily Living     03/10/2024    2:41 PM 11/19/2023    1:00 AM  In your present state of health, do you have any difficulty performing the following activities:  Hearing? 1 0  Vision? 0 0  Difficulty concentrating or making decisions? 0 0  Walking or climbing stairs? 0   Dressing or bathing? 0   Doing errands, shopping? 1 0  Comment pt daughter   Quarry manager and eating ? Y   Comment pt daughter   Using the Toilet? N   In the past six months, have you accidently leaked urine? N   Do you have problems with loss of bowel control? N   Managing your Medications? Y   Comment pt daughter   Managing your Finances? Y   Comment pt daughter   Housekeeping or managing your Housekeeping? Y   Comment pt daughter     Patient Care Team: Lavell Bari LABOR, FNP as PCP - General (Family Medicine) Kate Lonni CROME, MD as PCP - Cardiology (Cardiology) Cleatus Collar,  MD as Consulting Physician (Ophthalmology) Georjean Darice HERO, MD as Consulting Physician  (Neurology) Dina Camie FORBES DEVONNA (Neurology) Caresse Cough, MD as Referring Physician (Ophthalmology) Frutoso Luz, MD as Referring Physician (Allergy ) Kate Lonni CROME, MD as Consulting Physician (Cardiology) Roddie Bring, DPM as Consulting Physician (Podiatry)  I have updated your Care Teams any recent Medical Services you may have received from other providers in the past year.     Assessment:   This is a routine wellness examination for Nelson.  Hearing/Vision screen Hearing Screening - Comments:: Per pt's daughter, pt has hearing loss Vision Screening - Comments:: Pt denies vision dif/pt goes to [facility] ov yr   Goals Addressed             This Visit's Progress    Plan meals   On track    Try to eat vegetables, lean proteins, and limit amount of fried foods        Depression Screen     03/10/2024    2:47 PM 11/28/2023    3:39 PM 06/12/2023    2:13 PM 03/10/2023    3:35 PM 07/04/2022    2:37 PM 07/01/2022    2:48 PM 03/06/2022   12:38 PM  PHQ 2/9 Scores  PHQ - 2 Score 0 0 0 0 0 0 0  PHQ- 9 Score 0 0 0  0 0     Fall Risk     03/10/2024    2:32 PM 12/04/2023   12:47 PM 06/12/2023    2:12 PM 03/10/2023    3:33 PM 12/04/2022    3:01 PM  Fall Risk   Falls in the past year? 1 1 0 0 0  Number falls in past yr: 1 1 0 0 0  Injury with Fall? 0 1 0 0 0  Risk for fall due to : Impaired balance/gait;Impaired mobility  No Fall Risks No Fall Risks   Follow up  Falls evaluation completed Falls evaluation completed;Education provided Falls prevention discussed Falls evaluation completed    MEDICARE RISK AT HOME:  Medicare Risk at Home Any stairs in or around the home?: Yes If so, are there any without handrails?: Yes Home free of loose throw rugs in walkways, pet beds, electrical cords, etc?: Yes Adequate lighting in your home to reduce risk of falls?: Yes Life alert?: No Use of a cane, walker or w/c?: No Grab bars in the bathroom?:  Yes Shower chair or bench in shower?: Yes Elevated toilet seat or a handicapped toilet?: Yes  TIMED UP AND GO:  Was the test performed?  no  Cognitive Function: Unable: Due to language barrier, hearing or vision limitations or other     03/06/2022   12:42 PM 09/17/2017    4:00 PM 06/05/2016   11:22 AM 12/22/2014   10:41 AM  MMSE - Mini Mental State Exam  Not completed: Unable to complete;Refused     Orientation to time  5  5  5    Orientation to Place  5  5  5    Registration  3  3  3    Attention/ Calculation  5  5  5    Recall  3  3  3    Language- name 2 objects  2  2  2    Language- repeat  0 0 0  Language- follow 3 step command  3  3  3    Language- read & follow direction  1  1  1    Write a sentence  1  1  1   Copy design  1  1  1    Total score  29  29  29       Data saved with a previous flowsheet row definition      06/10/2016    1:00 PM  Montreal Cognitive Assessment   Visuospatial/ Executive (0/5) 5  Naming (0/3) 3  Attention: Read list of digits (0/2) 1  Attention: Read list of letters (0/1) 0  Attention: Serial 7 subtraction starting at 100 (0/3) 1  Language: Repeat phrase (0/2) 1  Language : Fluency (0/1) 0  Abstraction (0/2) 2  Delayed Recall (0/5) 5  Orientation (0/6) 5  Total 23  Adjusted Score (based on education) 24      03/10/2023    3:37 PM 02/24/2020   12:14 PM 02/17/2019    1:42 PM  6CIT Screen  What Year? 0 points 0 points 0 points  What month? 0 points 0 points 0 points  What time? 0 points 0 points 0 points  Count back from 20 0 points 0 points 0 points  Months in reverse 0 points 2 points 0 points  Repeat phrase 0 points 4 points 0 points  Total Score 0 points 6 points 0 points    Immunizations Immunization History  Administered Date(s) Administered   Fluad Quad(high Dose 65+) 06/29/2019, 06/11/2021, 06/20/2022   Influenza, High Dose Seasonal PF 08/29/2014, 06/13/2016, 07/03/2017, 08/06/2017, 06/16/2018, 07/15/2018, 06/15/2019, 07/19/2020,  08/21/2021   Influenza,inj,Quad PF,6+ Mos 05/20/2013, 05/31/2014, 07/18/2015   Influenza-Unspecified 08/20/2011   Moderna Sars-Covid-2 Vaccination 09/23/2019, 10/22/2019, 07/18/2020   PFIZER(Purple Top)SARS-COV-2 Vaccination 07/19/2020   Pneumococcal Conjugate-13 05/31/2014   Pneumococcal Polysaccharide-23 07/22/2012, 06/14/2015, 03/04/2016, 11/22/2016, 08/06/2017, 07/15/2018, 06/15/2019, 08/21/2021   Respiratory Syncytial Virus Vaccine,Recomb Aduvanted(Arexvy) 06/24/2022   Tdap 02/02/2015   Zoster Recombinant(Shingrix) 03/06/2021    Screening Tests Health Maintenance  Topic Date Due   Zoster Vaccines- Shingrix (2 of 2) 05/01/2021   FOOT EXAM  04/24/2022   OPHTHALMOLOGY EXAM  05/28/2022   COVID-19 Vaccine (5 - 2024-25 season) 04/20/2023   INFLUENZA VACCINE  03/19/2024   HEMOGLOBIN A1C  05/20/2024   Diabetic kidney evaluation - eGFR measurement  11/27/2024   Diabetic kidney evaluation - Urine ACR  11/27/2024   DTaP/Tdap/Td (2 - Td or Tdap) 02/01/2025   Medicare Annual Wellness (AWV)  03/10/2025   Pneumococcal Vaccine: 50+ Years  Completed   DEXA SCAN  Completed   Hepatitis B Vaccines  Aged Out   HPV VACCINES  Aged Out   Meningococcal B Vaccine  Aged Out   Hepatitis C Screening  Discontinued    Health Maintenance  Health Maintenance Due  Topic Date Due   Zoster Vaccines- Shingrix (2 of 2) 05/01/2021   FOOT EXAM  04/24/2022   OPHTHALMOLOGY EXAM  05/28/2022   COVID-19 Vaccine (5 - 2024-25 season) 04/20/2023   Health Maintenance Items Addressed: See Nurse Notes at the end of this note  Additional Screening:  Vision Screening: Recommended annual ophthalmology exams for early detection of glaucoma and other disorders of the eye. Would you like a referral to an eye doctor? No    Dental Screening: Recommended annual dental exams for proper oral hygiene  Community Resource Referral / Chronic Care Management: CRR required this visit?  No   CCM required this visit?   No   Plan:    I have personally reviewed and noted the following in the patient's chart:   Medical and social history Use of alcohol, tobacco or illicit drugs  Current medications and supplements including opioid prescriptions. Patient is not currently taking opioid prescriptions. Functional ability and status Nutritional status Physical activity Advanced directives List of other physicians Hospitalizations, surgeries, and ER visits in previous 12 months Vitals Screenings to include cognitive, depression, and falls Referrals and appointments  In addition, I have reviewed and discussed with patient certain preventive protocols, quality metrics, and best practice recommendations. A written personalized care plan for preventive services as well as general preventive health recommendations were provided to patient.   Ozie Ned, CMA   03/10/2024   After Visit Summary: (MyChart) Due to this being a telephonic visit, the after visit summary with patients personalized plan was offered to patient via MyChart   Notes: Nothing significant to report at this time.

## 2024-03-11 NOTE — Telephone Encounter (Signed)
 Christy NTBS in Aug for 3 mos FU RF sent to pharmacy

## 2024-03-11 NOTE — Telephone Encounter (Signed)
 Appt made 04-22-2024

## 2024-03-18 DIAGNOSIS — J3081 Allergic rhinitis due to animal (cat) (dog) hair and dander: Secondary | ICD-10-CM | POA: Diagnosis not present

## 2024-03-18 DIAGNOSIS — J301 Allergic rhinitis due to pollen: Secondary | ICD-10-CM | POA: Diagnosis not present

## 2024-03-18 DIAGNOSIS — J3089 Other allergic rhinitis: Secondary | ICD-10-CM | POA: Diagnosis not present

## 2024-03-26 DIAGNOSIS — J301 Allergic rhinitis due to pollen: Secondary | ICD-10-CM | POA: Diagnosis not present

## 2024-03-26 DIAGNOSIS — J3081 Allergic rhinitis due to animal (cat) (dog) hair and dander: Secondary | ICD-10-CM | POA: Diagnosis not present

## 2024-03-26 DIAGNOSIS — J3089 Other allergic rhinitis: Secondary | ICD-10-CM | POA: Diagnosis not present

## 2024-04-02 DIAGNOSIS — J301 Allergic rhinitis due to pollen: Secondary | ICD-10-CM | POA: Diagnosis not present

## 2024-04-02 DIAGNOSIS — J3081 Allergic rhinitis due to animal (cat) (dog) hair and dander: Secondary | ICD-10-CM | POA: Diagnosis not present

## 2024-04-02 DIAGNOSIS — J3089 Other allergic rhinitis: Secondary | ICD-10-CM | POA: Diagnosis not present

## 2024-04-06 ENCOUNTER — Ambulatory Visit: Admitting: Family

## 2024-04-07 ENCOUNTER — Other Ambulatory Visit

## 2024-04-07 DIAGNOSIS — I10 Essential (primary) hypertension: Secondary | ICD-10-CM

## 2024-04-07 DIAGNOSIS — E1169 Type 2 diabetes mellitus with other specified complication: Secondary | ICD-10-CM

## 2024-04-07 DIAGNOSIS — E039 Hypothyroidism, unspecified: Secondary | ICD-10-CM

## 2024-04-07 DIAGNOSIS — E1142 Type 2 diabetes mellitus with diabetic polyneuropathy: Secondary | ICD-10-CM

## 2024-04-07 DIAGNOSIS — D649 Anemia, unspecified: Secondary | ICD-10-CM | POA: Diagnosis not present

## 2024-04-07 DIAGNOSIS — E785 Hyperlipidemia, unspecified: Secondary | ICD-10-CM | POA: Diagnosis not present

## 2024-04-07 LAB — LIPID PANEL

## 2024-04-07 LAB — BAYER DCA HB A1C WAIVED: HB A1C (BAYER DCA - WAIVED): 7.1 % — ABNORMAL HIGH (ref 4.8–5.6)

## 2024-04-08 ENCOUNTER — Ambulatory Visit: Payer: Self-pay | Admitting: Family

## 2024-04-08 LAB — CBC WITH DIFFERENTIAL/PLATELET
Basophils Absolute: 0.1 x10E3/uL (ref 0.0–0.2)
Basos: 1 %
EOS (ABSOLUTE): 0.2 x10E3/uL (ref 0.0–0.4)
Eos: 3 %
Hematocrit: 32.8 % — ABNORMAL LOW (ref 34.0–46.6)
Hemoglobin: 10.5 g/dL — ABNORMAL LOW (ref 11.1–15.9)
Immature Grans (Abs): 0 x10E3/uL (ref 0.0–0.1)
Immature Granulocytes: 0 %
Lymphocytes Absolute: 2.1 x10E3/uL (ref 0.7–3.1)
Lymphs: 24 %
MCH: 29.9 pg (ref 26.6–33.0)
MCHC: 32 g/dL (ref 31.5–35.7)
MCV: 93 fL (ref 79–97)
Monocytes Absolute: 0.8 x10E3/uL (ref 0.1–0.9)
Monocytes: 9 %
Neutrophils Absolute: 5.6 x10E3/uL (ref 1.4–7.0)
Neutrophils: 63 %
Platelets: 227 x10E3/uL (ref 150–450)
RBC: 3.51 x10E6/uL — ABNORMAL LOW (ref 3.77–5.28)
RDW: 12.1 % (ref 11.7–15.4)
WBC: 8.8 x10E3/uL (ref 3.4–10.8)

## 2024-04-08 LAB — CMP14+EGFR
ALT: 15 IU/L (ref 0–32)
AST: 19 IU/L (ref 0–40)
Albumin: 4.3 g/dL (ref 3.7–4.7)
Alkaline Phosphatase: 91 IU/L (ref 44–121)
BUN/Creatinine Ratio: 21 (ref 12–28)
BUN: 15 mg/dL (ref 8–27)
Bilirubin Total: 0.4 mg/dL (ref 0.0–1.2)
CO2: 23 mmol/L (ref 20–29)
Calcium: 9.3 mg/dL (ref 8.7–10.3)
Chloride: 102 mmol/L (ref 96–106)
Creatinine, Ser: 0.73 mg/dL (ref 0.57–1.00)
Globulin, Total: 2.1 g/dL (ref 1.5–4.5)
Glucose: 153 mg/dL — AB (ref 70–99)
Potassium: 4.2 mmol/L (ref 3.5–5.2)
Sodium: 142 mmol/L (ref 134–144)
Total Protein: 6.4 g/dL (ref 6.0–8.5)
eGFR: 82 mL/min/1.73 (ref >59)

## 2024-04-08 LAB — LIPID PANEL
Cholesterol, Total: 129 mg/dL (ref 100–199)
HDL: 46 mg/dL (ref >39)
LDL CALC COMMENT:: 2.8 ratio (ref 0.0–4.4)
LDL Chol Calc (NIH): 45 mg/dL (ref 0–99)
Triglycerides: 243 mg/dL — AB (ref 0–149)
VLDL Cholesterol Cal: 38 mg/dL (ref 5–40)

## 2024-04-08 LAB — TSH: TSH: 3.32 u[IU]/mL (ref 0.450–4.500)

## 2024-04-10 LAB — B12 AND FOLATE PANEL
Folate: 17.7 ng/mL (ref >3.0)
Vitamin B-12: 709 pg/mL (ref 232–1245)

## 2024-04-10 LAB — IRON AND TIBC
Iron Saturation: 13 % — ABNORMAL LOW (ref 15–55)
Iron: 41 ug/dL (ref 27–139)
Total Iron Binding Capacity: 326 ug/dL (ref 250–450)
UIBC: 285 ug/dL (ref 118–369)

## 2024-04-10 LAB — SPECIMEN STATUS REPORT

## 2024-04-12 ENCOUNTER — Ambulatory Visit: Payer: Self-pay

## 2024-04-12 NOTE — Telephone Encounter (Signed)
 FYI Only or Action Required?: FYI only for provider. - Changed tomorrow's previously scheduled appt to 3 mo follow up, acute issue as of 8/25: scabs all over, potential itching, some 'inflammation' around each, sister concerned about hx of scabies//RNTriage  Patient was last seen in primary care on 12/26/2023 by Cathlene Marry Lenis, FNP.  Called Nurse Triage reporting scabs, potential itching, doesn't seem to be in pain, inflammation around each scab, hx scabies, and flaky skin.  Symptoms began first noticed yesterday.  Interventions attempted: Rest, hydration, or home remedies and Other: other sister put a cream on but not sure what kind.  Symptoms are: gradually worsening.  Triage Disposition: See Physician Within 24 Hours  Patient/caregiver understands and will follow disposition?: Yes      Copied from CRM #[id number]. Topic: Clinical - Red Word Triage >> Apr 12, 2024  9:53 AM Laurier BROCKS wrote: Red Word that prompted transfer to Nurse Triage:  Sister Harles) is reporting her sister has lots of scabs and believes her sister may have scabbies/mites. Patient doesn't seem to be in any pain. Patient has demntia and can barley talk. They have gotten some cream but that snt seem to help. Her sister is concerned because she can't describe or understand what's going on. Reason for Disposition  [1] Unexplained sores AND [2] 3 or more  Answer Assessment - Initial Assessment Questions 1. APPEARANCE of SORES: What do the sores look like?     Didn't seem to be open, some on chest and back of neck look like been there a while, scabbed over, very red around the scab, also place on ankle dots in a line about 2 plus inches then starting off in another direction, has had scabies before, researching, if got it again didn't realize so highly contagious, flaky skin 2. NUMBER: How many sores are there?     Many spots 3. SIZE: How big is the largest sore?     Top of ballpoint pen, some  smaller and larger 4. LOCATION: Where are the sores located?     Chest and back, ankles, may go further, don't know about other parts of her body 5. ONSET: When did the sores begin?     First noticed yesterday 6. TENDER: Does it hurt when you touch it?  (Scale 1-10; or mild, moderate, severe)      Don't know, she can't talk, dementia, no grimacing noticed, didn't react to hug at all 7. CAUSE: What do you think is causing the sores?     Worried about scabies, hx 8. OTHER SYMPTOMS: Do you have any other symptoms? (e.g., fever, new weakness)     No fever or further weakness  Other sister didn't say what kind of cream  Protocols used: Sores-A-AH

## 2024-04-12 NOTE — Telephone Encounter (Signed)
 Noted

## 2024-04-13 ENCOUNTER — Encounter: Payer: Self-pay | Admitting: Family

## 2024-04-13 ENCOUNTER — Ambulatory Visit: Payer: Self-pay | Admitting: Family

## 2024-04-13 VITALS — BP 144/55 | HR 54 | Temp 97.2°F | Ht 64.0 in | Wt 153.4 lb

## 2024-04-13 DIAGNOSIS — D509 Iron deficiency anemia, unspecified: Secondary | ICD-10-CM

## 2024-04-13 DIAGNOSIS — I10 Essential (primary) hypertension: Secondary | ICD-10-CM | POA: Diagnosis not present

## 2024-04-13 DIAGNOSIS — J4520 Mild intermittent asthma, uncomplicated: Secondary | ICD-10-CM

## 2024-04-13 DIAGNOSIS — F411 Generalized anxiety disorder: Secondary | ICD-10-CM

## 2024-04-13 DIAGNOSIS — G4700 Insomnia, unspecified: Secondary | ICD-10-CM | POA: Diagnosis not present

## 2024-04-13 DIAGNOSIS — F331 Major depressive disorder, recurrent, moderate: Secondary | ICD-10-CM

## 2024-04-13 DIAGNOSIS — E039 Hypothyroidism, unspecified: Secondary | ICD-10-CM | POA: Diagnosis not present

## 2024-04-13 DIAGNOSIS — E1142 Type 2 diabetes mellitus with diabetic polyneuropathy: Secondary | ICD-10-CM | POA: Diagnosis not present

## 2024-04-13 DIAGNOSIS — L249 Irritant contact dermatitis, unspecified cause: Secondary | ICD-10-CM

## 2024-04-13 DIAGNOSIS — I700 Atherosclerosis of aorta: Secondary | ICD-10-CM

## 2024-04-13 DIAGNOSIS — E1169 Type 2 diabetes mellitus with other specified complication: Secondary | ICD-10-CM | POA: Diagnosis not present

## 2024-04-13 DIAGNOSIS — G3101 Pick's disease: Secondary | ICD-10-CM

## 2024-04-13 DIAGNOSIS — F0280 Dementia in other diseases classified elsewhere without behavioral disturbance: Secondary | ICD-10-CM

## 2024-04-13 MED ORDER — PREDNISONE 20 MG PO TABS: 40.0000 mg | ORAL_TABLET | Freq: Every day | ORAL | 0 refills | Status: AC

## 2024-04-13 MED ORDER — TRIAMCINOLONE ACETONIDE 0.1 % EX CREA: 1.0000 | TOPICAL_CREAM | Freq: Two times a day (BID) | CUTANEOUS | 0 refills | Status: DC

## 2024-04-13 MED ORDER — IRON (FERROUS SULFATE) 325 (65 FE) MG PO TABS: 325.0000 mg | ORAL_TABLET | Freq: Every day | ORAL | 3 refills | Status: DC

## 2024-04-13 NOTE — Progress Notes (Signed)
 Subjective:    Patient ID: Kristi Willis, female    DOB: 03-22-1941, 83 y.o.   MRN: [id number]  Chief Complaint  Patient presents with   Medical Management of Chronic Issues   Pt and daughter present today for  chronic follow up. She has aphasia so her daughter is doing most of the talking for her today.    She is followed by podiatry every 6 weeks. She is followed by Cardiologists annually.  Followed by Endo for hypothyroid and DM as needed. She is getting allergy  injections weekly.   Has aortic atherosclerosis and takes Crestor  daily. Hypertension This is a chronic problem. The current episode started more than 1 year ago. The problem has been waxing and waning since onset. The problem is uncontrolled. Pertinent negatives include no blurred vision, malaise/fatigue, peripheral edema or shortness of breath. Risk factors for coronary artery disease include dyslipidemia and sedentary lifestyle. The current treatment provides moderate improvement. Identifiable causes of hypertension include a thyroid  problem.  Asthma There is no cough, difficulty breathing, shortness of breath or wheezing. This is a chronic problem. The current episode started more than 1 year ago. The problem occurs intermittently. Pertinent negatives include no malaise/fatigue. Her symptoms are aggravated by pollen. She reports moderate improvement on treatment.  Thyroid  Problem Presents for follow-up visit. Symptoms include anxiety. Patient reports no constipation, diaphoresis, diarrhea, dry skin or fatigue. The symptoms have been stable. Her past medical history is significant for hyperlipidemia.  Hyperlipidemia This is a chronic problem. The current episode started more than 1 year ago. The problem is controlled. Recent lipid tests were reviewed and are normal. Pertinent negatives include no shortness of breath. Current antihyperlipidemic treatment includes statins. The current treatment provides moderate improvement of  lipids. Risk factors for coronary artery disease include dyslipidemia, hypertension and a sedentary lifestyle.  Diabetes She presents for her follow-up diabetic visit. She has type 2 diabetes mellitus. Hypoglycemia symptoms include nervousness/anxiousness. Associated symptoms include foot paresthesias. Pertinent negatives for diabetes include no blurred vision and no fatigue. Symptoms are stable. Diabetic complications include peripheral neuropathy. Risk factors for coronary artery disease include dyslipidemia, diabetes mellitus, hypertension, sedentary lifestyle and post-menopausal. She is following a generally healthy diet. (Does not check glucose at home) Eye exam is current.  Back Pain This is a chronic problem. The current episode started more than 1 year ago. The problem occurs intermittently. The problem has been waxing and waning since onset. The pain is present in the lumbar spine. The quality of the pain is described as aching. The pain is mild. The treatment provided moderate relief.  Anemia Presents for follow-up visit. There has been no malaise/fatigue.  Insomnia Primary symptoms: difficulty falling asleep, frequent awakening, no malaise/fatigue.   The current episode started more than one year. The problem occurs intermittently. Past treatments include medication. The treatment provided moderate relief.  Anxiety Presents for follow-up visit. Symptoms include excessive worry and nervous/anxious behavior. Patient reports no shortness of breath. Symptoms occur rarely. The severity of symptoms is mild.   Her past medical history is significant for anemia.  Depression        This is a chronic problem.  The current episode started more than 1 year ago.   The problem occurs intermittently.  Associated symptoms include sad.  Associated symptoms include no fatigue, no helplessness and no hopelessness.  Past medical history includes thyroid  problem.   Rash This is a new problem. The current  episode started 1 to 4 weeks ago. The  problem is unchanged. The affected locations include the neck, chest and back. Pertinent negatives include no cough, diarrhea, fatigue or shortness of breath. Past treatments include nothing. The treatment provided no relief.      Review of Systems  Constitutional:  Negative for diaphoresis, fatigue and malaise/fatigue.  Eyes:  Negative for blurred vision.  Respiratory:  Negative for cough, shortness of breath and wheezing.   Gastrointestinal:  Negative for constipation and diarrhea.  Musculoskeletal:  Positive for back pain.  Skin:  Positive for rash.  Psychiatric/Behavioral:  Positive for depression. The patient is nervous/anxious and has insomnia.   All other systems reviewed and are negative.      Objective:   Physical Exam Vitals reviewed.  Constitutional:      General: She is not in acute distress.    Appearance: She is well-developed.  HENT:     Head: Normocephalic and atraumatic.     Right Ear: Tympanic membrane normal.     Left Ear: Tympanic membrane normal.  Eyes:     Pupils: Pupils are equal, round, and reactive to light.  Neck:     Thyroid : No thyromegaly.  Cardiovascular:     Rate and Rhythm: Normal rate and regular rhythm.     Heart sounds: Normal heart sounds. No murmur heard. Pulmonary:     Effort: Pulmonary effort is normal. No respiratory distress.     Breath sounds: No wheezing.  Abdominal:     General: Bowel sounds are normal. There is no distension.     Palpations: Abdomen is soft.     Tenderness: There is no abdominal tenderness.  Musculoskeletal:        General: No tenderness. Normal range of motion.     Cervical back: Normal range of motion and neck supple.  Skin:    General: Skin is warm and dry.     Findings: Rash present.         Comments: Erythemas papule rash on chest and lower back  Neurological:     Mental Status: She is alert and oriented to person, place, and time.     Cranial Nerves: No cranial  nerve deficit.     Deep Tendon Reflexes: Reflexes are normal and symmetric.  Psychiatric:        Speech: She is noncommunicative.        Behavior: Behavior normal.        Thought Content: Thought content normal.        Judgment: Judgment normal.     Comments: Aphasia, writes every down       BP (!) 157/54   Pulse (!) 54   Temp (!) 97.2 F (36.2 C) (Temporal)   Ht 5' 4 (1.626 m)   Wt 153 lb 6.4 oz (69.6 kg)   BMI 26.33 kg/m      Assessment & Plan:  Kristi Willis comes in today with chief complaint of Medical Management of Chronic Issues   Diagnosis and orders addressed:  1. Mild intermittent asthma without complication  2. Hyperlipidemia associated with type 2 diabetes mellitus (HCC)  3. Insomnia, unspecified type  4. Moderate episode of recurrent major depressive disorder (HCC)  5. Type 2 diabetes mellitus with diabetic polyneuropathy, without long-term current use of insulin  (HCC) (Primary)  6. GAD (generalized anxiety disorder)  7. Essential hypertension  8. Hypothyroidism, unspecified type  9. Iron  deficiency anemia, unspecified iron  deficiency anemia type - Iron , Ferrous Sulfate , 325 (65 Fe) MG TABS; Take 325 mg by mouth daily.  Dispense:  90 tablet; Refill: 3  10. Primary progressive aphasia (HCC)  11. Aortic atherosclerosis (HCC)  12. Irritant contact dermatitis, unspecified trigger Avoid scratching  Report any s/s of infection - predniSONE  (DELTASONE ) 20 MG tablet; Take 2 tablets (40 mg total) by mouth daily with breakfast for 5 days.  Dispense: 10 tablet; Refill: 0 - triamcinolone  cream (KENALOG ) 0.1 %; Apply 1 Application topically 2 (two) times daily.  Dispense: 30 g; Refill: 0  Labs reviewed that she had prior to visit Continue current medications  Health Maintenance reviewed Diet and exercise encouraged  Follow up plan: 4 months   Bari Learn, FNP

## 2024-04-13 NOTE — Patient Instructions (Signed)
 Contact Dermatitis Dermatitis is redness, soreness, and swelling (inflammation) of the skin. Contact dermatitis is a reaction to certain substances that touch the skin. There are two types of this condition: Irritant contact dermatitis. This is the most common type. It happens when something irritates your skin, such as when your hands get dry from washing them too often with soap. You can get this type of reaction even if you have not been exposed to the irritant before. Allergic contact dermatitis. This type is caused by a substance that you are allergic to, such as poison ivy. It occurs when you have been exposed to the substance (allergen) and form a sensitivity to it. In some cases, the reaction may start soon after your first exposure to the allergen. In other cases, it may not start until you are exposed to the allergen again. It may then occur every time you are exposed to the allergen in the future. What are the causes? Irritant contact dermatitis is often caused by exposure to: Makeup. Soaps, detergents, and bleaches. Acids. Metal salts, such as nickel. Allergic contact dermatitis is often caused by exposure to: Poisonous plants. Chemicals. Jewelry. Latex. Medicines. Preservatives in products, such as clothes. What increases the risk? You are more likely to get this condition if you have: A job that exposes you to irritants or allergens. Certain medical conditions. These include asthma and eczema. What are the signs or symptoms? Symptoms of this condition may occur in any place on your body that has been touched by the irritant. Symptoms include: Dryness, flaking, or cracking. Redness. Itching. Pain or a burning feeling. Blisters. Drainage of small amounts of blood or clear fluid from skin cracks. With allergic contact dermatitis, there may also be swelling in areas such as the eyelids, mouth, or genitals. How is this diagnosed? This condition is diagnosed with a medical  history and physical exam. A patch skin test may be done to help figure out the cause. If the condition is related to your job, you may need to see an expert in health problems in the workplace (occupational medicine specialist). How is this treated? This condition is treated by staying away from the cause of the reaction and protecting your skin from further contact. Treatment may also include: Steroid creams or ointments. Steroid medicines may need be taken by mouth (orally) in more severe cases. Antibiotics or medicines applied to the skin to kill bacteria (antibacterial ointments). These may be needed if a skin infection is present. Antihistamines. These may be taken orally or put on as a lotion to ease itching. A bandage (dressing). Follow these instructions at home: Skin care Moisturize your skin as needed. Put cool, wet cloths (cool compresses) on the affected areas. Try applying baking soda paste to your skin. Stir water into baking soda until it has the consistency of a paste. Do not scratch your skin. Avoid friction to the affected area. Avoid the use of soaps, perfumes, and dyes. Check the affected areas every day for signs of infection. Check for: More redness, swelling, or pain. More fluid or blood. Warmth. Pus or a bad smell. Medicines Take or apply over-the-counter and prescription medicines only as told by your health care provider. If you were prescribed antibiotics, take or apply them as told by your health care provider. Do not stop using the antibiotic even if you start to feel better. Bathing Try taking a bath with: Epsom salts. Follow the instructions on the packaging. You can get these at your local pharmacy  or grocery store. Baking soda. Pour a small amount into the bath as told by your health care provider. Colloidal oatmeal. Follow the instructions on the packaging. You can get this at your local pharmacy or grocery store. Bathe less often. This may mean bathing  every other day. Bathe in lukewarm water. Avoid using hot water. Bandage care If you were given a dressing, change it as told by your health care provider. Wash your hands with soap and water for at least 20 seconds before and after you change your dressing. If soap and water are not available, use hand sanitizer. General instructions Avoid the substance that caused your reaction. If you do not know what caused it, keep a journal to try to track what caused it. Write down: What you eat and drink. What cosmetics you use. What you wear in the affected area. This includes jewelry. Contact a health care provider if: Your condition does not get better with treatment. Your condition gets worse. You have any signs of infection. You have a fever. You have new symptoms. Your bone or joint under the affected area becomes painful after the skin has healed. Get help right away if: You notice red streaks coming from the affected area. The affected area turns darker. You have trouble breathing. This information is not intended to replace advice given to you by your health care provider. Make sure you discuss any questions you have with your health care provider. Document Revised: 02/08/2022 Document Reviewed: 02/08/2022 Elsevier Patient Education  2024 [employer].

## 2024-04-22 ENCOUNTER — Encounter (INDEPENDENT_AMBULATORY_CARE_PROVIDER_SITE_OTHER): Payer: Self-pay

## 2024-04-22 ENCOUNTER — Ambulatory Visit: Admitting: Family

## 2024-04-22 DIAGNOSIS — J3089 Other allergic rhinitis: Secondary | ICD-10-CM | POA: Diagnosis not present

## 2024-04-22 DIAGNOSIS — J301 Allergic rhinitis due to pollen: Secondary | ICD-10-CM | POA: Diagnosis not present

## 2024-04-22 DIAGNOSIS — L249 Irritant contact dermatitis, unspecified cause: Secondary | ICD-10-CM | POA: Diagnosis not present

## 2024-04-22 DIAGNOSIS — J3081 Allergic rhinitis due to animal (cat) (dog) hair and dander: Secondary | ICD-10-CM | POA: Diagnosis not present

## 2024-04-23 MED ORDER — TRIAMCINOLONE ACETONIDE 0.5 % EX OINT: 1.0000 | TOPICAL_OINTMENT | Freq: Two times a day (BID) | CUTANEOUS | 2 refills | Status: DC

## 2024-04-23 MED ORDER — PREDNISONE 20 MG PO TABS: 40.0000 mg | ORAL_TABLET | Freq: Every day | ORAL | 0 refills | Status: AC

## 2024-04-23 NOTE — Telephone Encounter (Signed)
 Will reorder prednisone  and kenalog . Let me know if area worsens. Called and discussed with daughter.   Bari Learn, FNP   Approximately 5 minutes was spent documenting and reviewing patient's chart.

## 2024-04-30 DIAGNOSIS — J3089 Other allergic rhinitis: Secondary | ICD-10-CM | POA: Diagnosis not present

## 2024-04-30 DIAGNOSIS — J3081 Allergic rhinitis due to animal (cat) (dog) hair and dander: Secondary | ICD-10-CM | POA: Diagnosis not present

## 2024-04-30 DIAGNOSIS — J301 Allergic rhinitis due to pollen: Secondary | ICD-10-CM | POA: Diagnosis not present

## 2024-05-07 DIAGNOSIS — J3089 Other allergic rhinitis: Secondary | ICD-10-CM | POA: Diagnosis not present

## 2024-05-07 DIAGNOSIS — J3081 Allergic rhinitis due to animal (cat) (dog) hair and dander: Secondary | ICD-10-CM | POA: Diagnosis not present

## 2024-05-07 DIAGNOSIS — J301 Allergic rhinitis due to pollen: Secondary | ICD-10-CM | POA: Diagnosis not present

## 2024-05-12 DIAGNOSIS — J3081 Allergic rhinitis due to animal (cat) (dog) hair and dander: Secondary | ICD-10-CM | POA: Diagnosis not present

## 2024-05-12 DIAGNOSIS — J301 Allergic rhinitis due to pollen: Secondary | ICD-10-CM | POA: Diagnosis not present

## 2024-05-12 DIAGNOSIS — J3089 Other allergic rhinitis: Secondary | ICD-10-CM | POA: Diagnosis not present

## 2024-05-19 ENCOUNTER — Other Ambulatory Visit: Payer: Self-pay | Admitting: *Deleted

## 2024-05-19 NOTE — Telephone Encounter (Signed)
 Fax from [facility] pharmacy RE: Famotidine  20 mg 1 BID #30 Note: ER prescribed when pt's lips were swelling. Pt's daughter states she needs another RF Last OV 04/13/24. Last RF by Dr Zackowski. Next OV 10/14/24 Please advise

## 2024-05-20 MED ORDER — FAMOTIDINE 20 MG PO TABS: 20.0000 mg | ORAL_TABLET | Freq: Two times a day (BID) | ORAL | 2 refills | Status: AC

## 2024-05-21 DIAGNOSIS — J3081 Allergic rhinitis due to animal (cat) (dog) hair and dander: Secondary | ICD-10-CM | POA: Diagnosis not present

## 2024-05-21 DIAGNOSIS — J301 Allergic rhinitis due to pollen: Secondary | ICD-10-CM | POA: Diagnosis not present

## 2024-05-21 DIAGNOSIS — J3089 Other allergic rhinitis: Secondary | ICD-10-CM | POA: Diagnosis not present

## 2024-05-25 ENCOUNTER — Other Ambulatory Visit: Payer: Self-pay | Admitting: Family

## 2024-05-25 DIAGNOSIS — I10 Essential (primary) hypertension: Secondary | ICD-10-CM

## 2024-05-28 DIAGNOSIS — J301 Allergic rhinitis due to pollen: Secondary | ICD-10-CM | POA: Diagnosis not present

## 2024-05-28 DIAGNOSIS — J3081 Allergic rhinitis due to animal (cat) (dog) hair and dander: Secondary | ICD-10-CM | POA: Diagnosis not present

## 2024-05-28 DIAGNOSIS — J3089 Other allergic rhinitis: Secondary | ICD-10-CM | POA: Diagnosis not present

## 2024-06-01 ENCOUNTER — Ambulatory Visit: Admitting: Nurse Practitioner

## 2024-06-01 ENCOUNTER — Encounter: Payer: Self-pay | Admitting: Nurse Practitioner

## 2024-06-01 VITALS — BP 133/58 | HR 64 | Temp 97.0°F | Ht 64.0 in | Wt 154.0 lb

## 2024-06-01 DIAGNOSIS — J069 Acute upper respiratory infection, unspecified: Secondary | ICD-10-CM | POA: Diagnosis not present

## 2024-06-01 MED ORDER — AMOXICILLIN-POT CLAVULANATE 875-125 MG PO TABS: 1.0000 | ORAL_TABLET | Freq: Two times a day (BID) | ORAL | 0 refills | Status: DC

## 2024-06-01 MED ORDER — BENZONATATE 100 MG PO CAPS: 100.0000 mg | ORAL_CAPSULE | Freq: Three times a day (TID) | ORAL | 0 refills | Status: DC | PRN

## 2024-06-01 NOTE — Patient Instructions (Addendum)

## 2024-06-01 NOTE — Progress Notes (Signed)
 Subjective:    Patient ID: Kristi Willis, female    DOB: Mar 21, 1941, 83 y.o.   MRN: [id number]   Chief Complaint: Cough (Daughter is worried that it will turn in to pneumonia)   Patient brought in by daughter with couh and congestesed that started sevral days ago. Patient has aphagia, so daughter is doing all speaking for her.  URI  This is a new problem. The current episode started in the past 7 days. The problem has been waxing and waning. Associated symptoms include congestion, coughing, rhinorrhea and a sore throat. Treatments tried: allergy  shots and nothing else. The treatment provided no relief.    Patient Active Problem List   Diagnosis Date Noted   Weakness 11/19/2023   Hypoxia 11/19/2023   Allergic rhinitis 03/05/2022   Pulmonary congestion 02/11/2021   Aortic atherosclerosis 02/11/2021   Hypo-osmolar hyponatremia 02/10/2021   Primary progressive aphasia (HCC) 03/23/2020   Pain in left shoulder 11/03/2019   Acute metabolic encephalopathy 08/05/2019   Iron  deficiency anemia 08/05/2019   QT prolongation 08/05/2019   Hypothyroidism 02/12/2019   Speech articulation disorder 09/01/2018   Essential hypertension 10/03/2017   GAD (generalized anxiety disorder) 07/03/2017   Diabetes mellitus (HCC) 09/27/2016   Depression 03/29/2016   Insomnia 07/18/2015   Vitamin D  deficiency 10/11/2013   Right carotid bruit 03/02/2013   Degenerative disc disease, lumbar 11/27/2012   Asthma 11/13/2010   Hyperlipidemia associated with type 2 diabetes mellitus (HCC) 11/13/2010   Cancer of skin 11/13/2010   Diabetic neuropathy (HCC) 11/13/2010   Wide-complex tachycardia==Atrial Tachycardia 11/13/2010       Review of Systems  Constitutional:  Negative for chills and fever.  HENT:  Positive for congestion, rhinorrhea and sore throat.   Respiratory:  Positive for cough.        Objective:   Physical Exam Constitutional:      Appearance: Normal appearance.  HENT:     Right  Ear: Tympanic membrane normal.     Left Ear: Tympanic membrane normal.     Nose: Congestion and rhinorrhea present.  Eyes:     Pupils: Pupils are equal, round, and reactive to light.  Cardiovascular:     Rate and Rhythm: Normal rate and regular rhythm.     Heart sounds: Normal heart sounds.  Pulmonary:     Breath sounds: Normal breath sounds.  Skin:    General: Skin is warm.  Neurological:     General: No focal deficit present.     Mental Status: She is alert and oriented to person, place, and time.  Psychiatric:        Mood and Affect: Mood normal.        Behavior: Behavior normal.     BP (!) 133/58   Pulse 64   Temp (!) 97 F (36.1 C) (Temporal)   Ht 5' 4 (1.626 m)   Wt 154 lb (69.9 kg)   SpO2 94%   BMI 26.43 kg/m        Assessment & Plan:   Kristi Willis in today with chief complaint of Cough (Daughter is worried that it will turn in to pneumonia)   1. URI with cough and congestion (Primary) 1. Take meds as prescribed 2. Use a cool mist humidifier especially during the winter months and when heat has been humid. 3. Use saline nose sprays frequently 4. Saline irrigations of the nose can be very helpful if done frequently.  * 4X daily for 1 week*  * Use of a  nettie pot can be helpful with this. Follow directions with this* 5. Drink plenty of fluids 6. Keep thermostat turn down low 7.For any cough or congestion- tessalon  perles 8. For fever or aces or pains- take tylenol  or ibuprofen appropriate for age and weight.  * for fevers greater than 101 orally you may alternate ibuprofen and tylenol  every  3 hours.    - benzonatate  (TESSALON  PERLES) 100 MG capsule; Take 1 capsule (100 mg total) by mouth 3 (three) times daily as needed for cough.  Dispense: 20 capsule; Refill: 0 - amoxicillin -clavulanate (AUGMENTIN ) 875-125 MG tablet; Take 1 tablet by mouth 2 (two) times daily.  Dispense: 20 tablet; Refill: 0    The above assessment and management plan was  discussed with the patient. The patient verbalized understanding of and has agreed to the management plan. Patient is aware to call the clinic if symptoms persist or worsen. Patient is aware when to return to the clinic for a follow-up visit. Patient educated on when it is appropriate to go to the emergency department.   Mary-Margaret Gladis, FNP

## 2024-06-08 ENCOUNTER — Other Ambulatory Visit: Payer: Self-pay | Admitting: Family

## 2024-06-08 DIAGNOSIS — E1142 Type 2 diabetes mellitus with diabetic polyneuropathy: Secondary | ICD-10-CM

## 2024-06-11 DIAGNOSIS — J301 Allergic rhinitis due to pollen: Secondary | ICD-10-CM | POA: Diagnosis not present

## 2024-06-11 DIAGNOSIS — J3081 Allergic rhinitis due to animal (cat) (dog) hair and dander: Secondary | ICD-10-CM | POA: Diagnosis not present

## 2024-06-11 DIAGNOSIS — J3089 Other allergic rhinitis: Secondary | ICD-10-CM | POA: Diagnosis not present

## 2024-06-15 MED ORDER — PREDNISONE 10 MG (21) PO TBPK: ORAL_TABLET | ORAL | 0 refills | Status: DC

## 2024-06-15 NOTE — Telephone Encounter (Signed)
 Called patients daughter. She saw mmm last week wants message to go to [place] wants Bari to send in prednisone  for patient. Send message through [place] per daughter.

## 2024-06-23 DIAGNOSIS — J3081 Allergic rhinitis due to animal (cat) (dog) hair and dander: Secondary | ICD-10-CM | POA: Diagnosis not present

## 2024-06-23 DIAGNOSIS — J3089 Other allergic rhinitis: Secondary | ICD-10-CM | POA: Diagnosis not present

## 2024-06-23 DIAGNOSIS — J301 Allergic rhinitis due to pollen: Secondary | ICD-10-CM | POA: Diagnosis not present

## 2024-06-28 DIAGNOSIS — D485 Neoplasm of uncertain behavior of skin: Secondary | ICD-10-CM | POA: Diagnosis not present

## 2024-06-28 DIAGNOSIS — L089 Local infection of the skin and subcutaneous tissue, unspecified: Secondary | ICD-10-CM | POA: Diagnosis not present

## 2024-06-28 DIAGNOSIS — L308 Other specified dermatitis: Secondary | ICD-10-CM | POA: Diagnosis not present

## 2024-06-28 DIAGNOSIS — L309 Dermatitis, unspecified: Secondary | ICD-10-CM | POA: Diagnosis not present

## 2024-06-30 ENCOUNTER — Other Ambulatory Visit: Payer: Self-pay | Admitting: *Deleted

## 2024-06-30 DIAGNOSIS — E1169 Type 2 diabetes mellitus with other specified complication: Secondary | ICD-10-CM

## 2024-07-02 DIAGNOSIS — J3089 Other allergic rhinitis: Secondary | ICD-10-CM | POA: Diagnosis not present

## 2024-07-02 DIAGNOSIS — J301 Allergic rhinitis due to pollen: Secondary | ICD-10-CM | POA: Diagnosis not present

## 2024-07-02 DIAGNOSIS — J3081 Allergic rhinitis due to animal (cat) (dog) hair and dander: Secondary | ICD-10-CM | POA: Diagnosis not present

## 2024-07-07 ENCOUNTER — Other Ambulatory Visit: Payer: Self-pay | Admitting: Family

## 2024-07-07 DIAGNOSIS — F411 Generalized anxiety disorder: Secondary | ICD-10-CM

## 2024-07-07 DIAGNOSIS — F331 Major depressive disorder, recurrent, moderate: Secondary | ICD-10-CM

## 2024-07-07 DIAGNOSIS — J4520 Mild intermittent asthma, uncomplicated: Secondary | ICD-10-CM

## 2024-07-08 DIAGNOSIS — E1142 Type 2 diabetes mellitus with diabetic polyneuropathy: Secondary | ICD-10-CM | POA: Diagnosis not present

## 2024-07-08 DIAGNOSIS — B351 Tinea unguium: Secondary | ICD-10-CM | POA: Diagnosis not present

## 2024-07-08 DIAGNOSIS — M79675 Pain in left toe(s): Secondary | ICD-10-CM | POA: Diagnosis not present

## 2024-07-08 DIAGNOSIS — M79674 Pain in right toe(s): Secondary | ICD-10-CM | POA: Diagnosis not present

## 2024-07-08 DIAGNOSIS — L84 Corns and callosities: Secondary | ICD-10-CM | POA: Diagnosis not present

## 2024-07-27 ENCOUNTER — Other Ambulatory Visit: Payer: Self-pay | Admitting: Family

## 2024-07-27 DIAGNOSIS — I10 Essential (primary) hypertension: Secondary | ICD-10-CM

## 2024-07-30 ENCOUNTER — Other Ambulatory Visit: Payer: Self-pay | Admitting: Family

## 2024-07-30 DIAGNOSIS — E1142 Type 2 diabetes mellitus with diabetic polyneuropathy: Secondary | ICD-10-CM

## 2024-08-30 ENCOUNTER — Encounter: Payer: Self-pay | Admitting: Family

## 2024-08-30 ENCOUNTER — Telehealth: Admitting: Family

## 2024-08-30 DIAGNOSIS — D509 Iron deficiency anemia, unspecified: Secondary | ICD-10-CM

## 2024-08-30 DIAGNOSIS — E1142 Type 2 diabetes mellitus with diabetic polyneuropathy: Secondary | ICD-10-CM | POA: Diagnosis not present

## 2024-08-30 DIAGNOSIS — Y92009 Unspecified place in unspecified non-institutional (private) residence as the place of occurrence of the external cause: Secondary | ICD-10-CM | POA: Diagnosis not present

## 2024-08-30 DIAGNOSIS — R531 Weakness: Secondary | ICD-10-CM

## 2024-08-30 DIAGNOSIS — W19XXXA Unspecified fall, initial encounter: Secondary | ICD-10-CM | POA: Diagnosis not present

## 2024-08-30 DIAGNOSIS — I10 Essential (primary) hypertension: Secondary | ICD-10-CM | POA: Diagnosis not present

## 2024-08-30 NOTE — Progress Notes (Signed)
 " Virtual Visit Consent   Kristi Willis, you are scheduled for a virtual visit with a [facility] provider today. Just as with appointments in the office, your consent must be obtained to participate. Your consent will be active for this visit and any virtual visit you may have with one of our providers in the next 365 days. If you have a MyChart account, a copy of this consent can be sent to you electronically.  As this is a virtual visit, video technology does not allow for your provider to perform a traditional examination. This may limit your provider's ability to fully assess your condition. If your provider identifies any concerns that need to be evaluated in person or the need to arrange testing (such as labs, EKG, etc.), we will make arrangements to do so. Although advances in technology are sophisticated, we cannot ensure that it will always work on either your end or our end. If the connection with a video visit is poor, the visit may have to be switched to a telephone visit. With either a video or telephone visit, we are not always able to ensure that we have a secure connection.  By engaging in this virtual visit, you consent to the provision of healthcare and authorize for your insurance to be billed (if applicable) for the services provided during this visit. Depending on your insurance coverage, you may receive a charge related to this service.  I need to obtain your verbal consent now. Are you willing to proceed with your visit today? Kristi Willis has provided verbal consent on 08/30/2024 for a virtual visit (video or telephone). Bari Learn, FNP  Date: 08/30/2024 3:01 PM   Virtual Visit via Video Note   I, Bari Learn, connected with  Kristi Willis  ([id number], 22-Sep-1940) on 08/30/2024 at  3:40 PM EST by a video-enabled telemedicine application and verified that I am speaking with the correct person using two identifiers.  Location: Patient: Virtual Visit  Location Patient: Home Provider: Virtual Visit Location Provider: Home Office   I discussed the limitations of evaluation and management by telemedicine and the availability of in person appointments. The patient expressed understanding and agreed to proceed.    History of Present Illness: Kristi Willis is a 84 y.o. who identifies as a female who was assigned female at birth, and is being seen today for order for home health. Reports she had insurance nurse come to her house and recommended a referral for home health.   HPI: Hypertension This is a chronic problem. The current episode started more than 1 year ago. The problem has been gradually worsening since onset. The problem is controlled. Associated symptoms include malaise/fatigue. Pertinent negatives include no blurred vision, peripheral edema or shortness of breath. Risk factors for coronary artery disease include sedentary lifestyle and post-menopausal state. The current treatment provides moderate improvement.  Diabetes She presents for her follow-up diabetic visit. She has type 2 diabetes mellitus. Pertinent negatives for diabetes include no blurred vision and no foot paresthesias. Risk factors for coronary artery disease include diabetes mellitus, hypertension and dyslipidemia. She is following a generally unhealthy diet. (Does not check glucose at home)    Problems:  Patient Active Problem List   Diagnosis Date Noted   Weakness 11/19/2023   Hypoxia 11/19/2023   Allergic rhinitis 03/05/2022   Pulmonary congestion 02/11/2021   Aortic atherosclerosis 02/11/2021   Hypo-osmolar hyponatremia 02/10/2021   Primary progressive aphasia (HCC) 03/23/2020   Pain in left shoulder 11/03/2019  Acute metabolic encephalopathy 08/05/2019   Iron  deficiency anemia 08/05/2019   QT prolongation 08/05/2019   Hypothyroidism 02/12/2019   Speech articulation disorder 09/01/2018   Essential hypertension 10/03/2017   GAD (generalized anxiety  disorder) 07/03/2017   Diabetes mellitus (HCC) 09/27/2016   Depression 03/29/2016   Insomnia 07/18/2015   Vitamin D  deficiency 10/11/2013   Right carotid bruit 03/02/2013   Degenerative disc disease, lumbar 11/27/2012   Asthma 11/13/2010   Hyperlipidemia associated with type 2 diabetes mellitus (HCC) 11/13/2010   Cancer of skin 11/13/2010   Diabetic neuropathy (HCC) 11/13/2010   Wide-complex tachycardia==Atrial Tachycardia 11/13/2010    Allergies: Allergies[1] Medications: Current Medications[2]  Observations/Objective: Patient is well-developed, well-nourished in no acute distress.  Resting comfortably  at home.  Head is normocephalic, atraumatic.  No labored breathing.  Speech is clear and coherent with logical content.  Patient is alert and oriented at baseline.  Daughter did most of the talking, as patient has aphagia   Assessment and Plan: 1. Essential hypertension (Primary) - Ambulatory referral to Home Health  2. Type 2 diabetes mellitus with diabetic polyneuropathy, without long-term current use of insulin  (HCC) - Ambulatory referral to Home Health  3. Iron  deficiency anemia, unspecified iron  deficiency anemia type - Ambulatory referral to Home Health  4. Weakness - Ambulatory referral to Home Health  5. Fall in home, initial encounter - Ambulatory referral to Home Health  Pt's daughter will call [name] at [employer] to see if patient qualifies for CAPS program. Then we can get in home support.  Fall precautions discussed Keep chronic follow up  Follow Up Instructions: I discussed the assessment and treatment plan with the patient. The patient was provided an opportunity to ask questions and all were answered. The patient agreed with the plan and demonstrated an understanding of the instructions.  A copy of instructions were sent to the patient via MyChart unless otherwise noted below.     The patient was advised to call back or seek an in-person evaluation if the  symptoms worsen or if the condition fails to improve as anticipated.    Bari Learn, FNP    [1]  Allergies Allergen Reactions   Dimetapp C [Phenylephrine-Bromphen-Codeine] Itching   Phenylephrine Hcl Itching   Shingrix [Zoster Vac Recomb Adjuvanted] Other (See Comments)    Swelling and fever    Ultram [Tramadol Hcl] Other (See Comments)    Insomnia   Celebrex [Celecoxib] Rash   Cortisone Rash   Fenofibrate Rash   Sulfa Antibiotics Rash  [2]  Current Outpatient Medications:    Accu-Chek Softclix Lancets lancets, CHECK BLOOD SUGAR ONCE DAILY OR AS DIRECTED Dx E11.40, Disp: 100 each, Rfl: 3   acyclovir  ointment (ZOVIRAX ) 5 %, APPLY TO THE AFFECTED AREA AROUND MOUTH EVERY 3 HOURS AS DIRECTED UNTIL CLEAR, Disp: 15 g, Rfl: 0   albuterol  (VENTOLIN  HFA) 108 (90 Base) MCG/ACT inhaler, INHALE TWO PUFFS EVERY 6 HOURS AS NEEDED FOR WHEEZING OR SHORTNESS OF BREATH, Disp: 8.5 g, Rfl: 2   amLODipine  (NORVASC ) 5 MG tablet, TAKE ONE TABLET BY MOUTH DAILY, Disp: 30 tablet, Rfl: 2   amoxicillin -clavulanate (AUGMENTIN ) 875-125 MG tablet, Take 1 tablet by mouth 2 (two) times daily., Disp: 20 tablet, Rfl: 0   aspirin  81 MG chewable tablet, Chew 81 mg by mouth at bedtime., Disp: , Rfl:    azelastine  (ASTELIN ) 0.1 % nasal spray, Place 2 sprays into both nostrils 2 (two) times daily., Disp: 30 mL, Rfl: 11   benzonatate  (TESSALON  PERLES) 100 MG capsule,  Take 1 capsule (100 mg total) by mouth 3 (three) times daily as needed for cough., Disp: 20 capsule, Rfl: 0   budesonide  (PULMICORT ) 0.25 MG/2ML nebulizer solution, Take 2 mLs (0.25 mg total) by nebulization 2 (two) times daily., Disp: 60 mL, Rfl: 12   cetirizine  (ZYRTEC ) 10 MG tablet, TAKE ONE TABLET DAILY, Disp: 90 tablet, Rfl: 1   Coenzyme Q10 (COQ10 PO), Take 1 tablet by mouth daily., Disp: , Rfl:    cyanocobalamin  (VITAMIN B12) 500 MCG tablet, Take 1 tablet (500 mcg total) by mouth daily., Disp: 30 tablet, Rfl: 0   doxazosin  (CARDURA ) 8 MG tablet, TAKE  ONE TABLET DAILY FOR BLOOD PRESSURE, Disp: 90 tablet, Rfl: 1   EPIPEN 2-PAK 0.3 MG/0.3ML SOAJ injection, Inject 0.3 mg into the muscle as needed for anaphylaxis. Reported on 01/24/2016, Disp: , Rfl:    escitalopram  (LEXAPRO ) 20 MG tablet, TAKE ONE TABLET BY MOUTH DAILY, Disp: 90 tablet, Rfl: 1   famotidine  (PEPCID ) 20 MG tablet, Take 1 tablet (20 mg total) by mouth 2 (two) times daily., Disp: 90 tablet, Rfl: 2   fluticasone (FLONASE) 50 MCG/ACT nasal spray, Place 1 spray into both nostrils daily., Disp: , Rfl:    furosemide  (LASIX ) 20 MG tablet, Take 1 tablet (20 mg total) by mouth daily. (Patient not taking: Reported on 06/01/2024), Disp: 30 tablet, Rfl: 3   furosemide  (LASIX ) 40 MG tablet, Take 1 tablet (40 mg total) by mouth daily for 7 days., Disp: 7 tablet, Rfl: 0   glucose blood (ACCU-CHEK AVIVA PLUS) test strip, CHECK BLOOD SUGAR ONCE DAILY OR AS DIRECTED Dx E11.40, Disp: 100 strip, Rfl: 3   guaiFENesin  (MUCINEX ) 600 MG 12 hr tablet, Take 2 tablets (1,200 mg total) by mouth 2 (two) times daily., Disp: 30 tablet, Rfl: 0   ipratropium-albuterol  (DUONEB) 0.5-2.5 (3) MG/3ML SOLN, Take 3 mLs by nebulization every 6 (six) hours., Disp: 125 mL, Rfl: 0   Iron , Ferrous Sulfate , 325 (65 Fe) MG TABS, Take 325 mg by mouth daily., Disp: 90 tablet, Rfl: 3   ketoconazole  (NIZORAL ) 2 % cream, Apply 1 application topically daily., Disp: 30 g, Rfl: 0   levothyroxine  (SYNTHROID ) 75 MCG tablet, TAKE ONE TABLET DAILY BEFORE BREAKFAST, Disp: 90 tablet, Rfl: 1   lisinopril  (ZESTRIL ) 40 MG tablet, TAKE ONE TABLET DAILY (Patient not taking: Reported on 06/01/2024), Disp: 90 tablet, Rfl: 1   metFORMIN  (GLUCOPHAGE ) 1000 MG tablet, TAKE ONE TABLET TWICE DAILY WITH MEAL(S), Disp: 180 tablet, Rfl: 1   metoprolol  succinate (TOPROL -XL) 100 MG 24 hr tablet, TAKE ONE TABLET DAILY, Disp: 90 tablet, Rfl: 1   montelukast  (SINGULAIR ) 10 MG tablet, TAKE ONE TABLET AT BEDTIME, Disp: 90 tablet, Rfl: 1   predniSONE  (STERAPRED UNI-PAK  21 TAB) 10 MG (21) TBPK tablet, Use as directed, Disp: 21 tablet, Rfl: 0   rosuvastatin  (CRESTOR ) 20 MG tablet, TAKE ONE TABLET AT BEDTIME, Disp: 90 tablet, Rfl: 1   sitaGLIPtin  (JANUVIA ) 50 MG tablet, TAKE ONE TABLET ONCE DAILY, Disp: 30 tablet, Rfl: 2   triamcinolone  ointment (KENALOG ) 0.5 %, Apply 1 Application topically 2 (two) times daily., Disp: 90 g, Rfl: 2  "

## 2024-09-18 ENCOUNTER — Emergency Department (HOSPITAL_BASED_OUTPATIENT_CLINIC_OR_DEPARTMENT_OTHER)

## 2024-09-18 ENCOUNTER — Other Ambulatory Visit: Payer: Self-pay

## 2024-09-18 ENCOUNTER — Observation Stay (HOSPITAL_BASED_OUTPATIENT_CLINIC_OR_DEPARTMENT_OTHER)
Admission: EM | Admit: 2024-09-18 | Discharge: 2024-09-21 | Disposition: A | Discharge status: Skilled Nursing Facility | Attending: Internal Medicine | Admitting: Internal Medicine

## 2024-09-18 DIAGNOSIS — E860 Dehydration: Principal | ICD-10-CM

## 2024-09-18 DIAGNOSIS — E039 Hypothyroidism, unspecified: Secondary | ICD-10-CM | POA: Diagnosis present

## 2024-09-18 DIAGNOSIS — D638 Anemia in other chronic diseases classified elsewhere: Secondary | ICD-10-CM | POA: Insufficient documentation

## 2024-09-18 DIAGNOSIS — J45909 Unspecified asthma, uncomplicated: Secondary | ICD-10-CM | POA: Insufficient documentation

## 2024-09-18 DIAGNOSIS — Z7984 Long term (current) use of oral hypoglycemic drugs: Secondary | ICD-10-CM | POA: Insufficient documentation

## 2024-09-18 DIAGNOSIS — Z79899 Other long term (current) drug therapy: Secondary | ICD-10-CM | POA: Insufficient documentation

## 2024-09-18 DIAGNOSIS — R739 Hyperglycemia, unspecified: Secondary | ICD-10-CM

## 2024-09-18 DIAGNOSIS — Z85828 Personal history of other malignant neoplasm of skin: Secondary | ICD-10-CM | POA: Insufficient documentation

## 2024-09-18 DIAGNOSIS — R4182 Altered mental status, unspecified: Secondary | ICD-10-CM | POA: Insufficient documentation

## 2024-09-18 DIAGNOSIS — E1169 Type 2 diabetes mellitus with other specified complication: Secondary | ICD-10-CM | POA: Diagnosis present

## 2024-09-18 DIAGNOSIS — I10 Essential (primary) hypertension: Secondary | ICD-10-CM | POA: Diagnosis present

## 2024-09-18 DIAGNOSIS — K219 Gastro-esophageal reflux disease without esophagitis: Secondary | ICD-10-CM | POA: Insufficient documentation

## 2024-09-18 DIAGNOSIS — G3109 Other frontotemporal dementia: Secondary | ICD-10-CM | POA: Insufficient documentation

## 2024-09-18 DIAGNOSIS — G3101 Pick's disease: Secondary | ICD-10-CM | POA: Insufficient documentation

## 2024-09-18 DIAGNOSIS — Z7982 Long term (current) use of aspirin: Secondary | ICD-10-CM | POA: Insufficient documentation

## 2024-09-18 DIAGNOSIS — R531 Weakness: Secondary | ICD-10-CM

## 2024-09-18 DIAGNOSIS — G629 Polyneuropathy, unspecified: Secondary | ICD-10-CM | POA: Insufficient documentation

## 2024-09-18 DIAGNOSIS — F0280 Dementia in other diseases classified elsewhere without behavioral disturbance: Secondary | ICD-10-CM | POA: Diagnosis present

## 2024-09-18 DIAGNOSIS — G319 Degenerative disease of nervous system, unspecified: Secondary | ICD-10-CM

## 2024-09-18 DIAGNOSIS — G9341 Metabolic encephalopathy: Principal | ICD-10-CM | POA: Diagnosis present

## 2024-09-18 DIAGNOSIS — E785 Hyperlipidemia, unspecified: Secondary | ICD-10-CM | POA: Insufficient documentation

## 2024-09-18 LAB — PRO BRAIN NATRIURETIC PEPTIDE: Pro Brain Natriuretic Peptide: 260 pg/mL

## 2024-09-18 LAB — COMPREHENSIVE METABOLIC PANEL WITH GFR
ALT: 16 U/L (ref 0–44)
AST: 28 U/L (ref 15–41)
Albumin: 4.3 g/dL (ref 3.5–5.0)
Alkaline Phosphatase: 154 U/L — ABNORMAL HIGH (ref 38–126)
Anion gap: 17 — ABNORMAL HIGH (ref 5–15)
BUN: 30 mg/dL — ABNORMAL HIGH (ref 8–23)
CO2: 24 mmol/L (ref 22–32)
Calcium: 9.5 mg/dL (ref 8.9–10.3)
Chloride: 98 mmol/L (ref 98–111)
Creatinine, Ser: 0.8 mg/dL (ref 0.44–1.00)
GFR, Estimated: >60 mL/min
Glucose, Bld: 301 mg/dL — ABNORMAL HIGH (ref 70–99)
Potassium: 4.2 mmol/L (ref 3.5–5.1)
Sodium: 138 mmol/L (ref 135–145)
Total Bilirubin: 0.4 mg/dL (ref 0.0–1.2)
Total Protein: 6.8 g/dL (ref 6.5–8.1)

## 2024-09-18 LAB — URINALYSIS, ROUTINE W REFLEX MICROSCOPIC
Bacteria, UA: NONE SEEN
Bilirubin Urine: NEGATIVE
Glucose, UA: >1000 mg/dL — AB
Hgb urine dipstick: NEGATIVE
Leukocytes,Ua: NEGATIVE
Nitrite: NEGATIVE
Protein, ur: 30 mg/dL — AB
Specific Gravity, Urine: 1.026 (ref 1.005–1.030)
pH: 5 (ref 5.0–8.0)

## 2024-09-18 LAB — CBC WITH DIFFERENTIAL/PLATELET
Abs Immature Granulocytes: 0.02 10*3/uL (ref 0.00–0.07)
Basophils Absolute: 0 10*3/uL (ref 0.0–0.1)
Basophils Relative: 0 %
Eosinophils Absolute: 0 10*3/uL (ref 0.0–0.5)
Eosinophils Relative: 0 %
HCT: 32.6 % — ABNORMAL LOW (ref 36.0–46.0)
Hemoglobin: 10.7 g/dL — ABNORMAL LOW (ref 12.0–15.0)
Immature Granulocytes: 0 %
Lymphocytes Relative: 13 %
Lymphs Abs: 1.4 10*3/uL (ref 0.7–4.0)
MCH: 27.8 pg (ref 26.0–34.0)
MCHC: 32.8 g/dL (ref 30.0–36.0)
MCV: 84.7 fL (ref 80.0–100.0)
Monocytes Absolute: 0.9 10*3/uL (ref 0.1–1.0)
Monocytes Relative: 8 %
Neutro Abs: 8.2 10*3/uL — ABNORMAL HIGH (ref 1.7–7.7)
Neutrophils Relative %: 79 %
Platelets: 200 10*3/uL (ref 150–400)
RBC: 3.85 MIL/uL — ABNORMAL LOW (ref 3.87–5.11)
RDW: 13.5 % (ref 11.5–15.5)
WBC: 10.5 10*3/uL (ref 4.0–10.5)
nRBC: 0 % (ref 0.0–0.2)

## 2024-09-18 LAB — TROPONIN T, HIGH SENSITIVITY
Troponin T High Sensitivity: 13 ng/L (ref 0–19)
Troponin T High Sensitivity: 11 ng/L (ref 0–19)

## 2024-09-18 LAB — VITAMIN B12: Vitamin B-12: 671 pg/mL (ref 180–914)

## 2024-09-18 LAB — TSH: TSH: 3.36 u[IU]/mL (ref 0.350–4.500)

## 2024-09-18 LAB — GLUCOSE, CAPILLARY: Glucose-Capillary: 184 mg/dL — ABNORMAL HIGH (ref 70–99)

## 2024-09-18 LAB — AMMONIA: Ammonia: 19 umol/L (ref 9–35)

## 2024-09-18 MED ORDER — ALBUTEROL SULFATE (2.5 MG/3ML) 0.083% IN NEBU
2.5000 mg | INHALATION_SOLUTION | Freq: Four times a day (QID) | RESPIRATORY_TRACT | Status: DC | PRN
  Administered 2024-09-20: 2.5 mg via RESPIRATORY_TRACT
  Filled 2024-09-18: qty 3

## 2024-09-18 MED ORDER — INSULIN ASPART 100 UNIT/ML IJ SOLN
0.0000 [IU] | Freq: Every day | INTRAMUSCULAR | Status: DC
  Administered 2024-09-19 – 2024-09-20 (×2): 2 [IU] via SUBCUTANEOUS
  Filled 2024-09-18 (×2): qty 2

## 2024-09-18 MED ORDER — HYDRALAZINE HCL 20 MG/ML IJ SOLN
5.0000 mg | Freq: Four times a day (QID) | INTRAMUSCULAR | Status: DC | PRN
  Administered 2024-09-19: 5 mg via INTRAVENOUS
  Filled 2024-09-18: qty 1

## 2024-09-18 MED ORDER — ENOXAPARIN SODIUM 40 MG/0.4ML IJ SOSY
40.0000 mg | PREFILLED_SYRINGE | INTRAMUSCULAR | Status: DC
  Administered 2024-09-19 – 2024-09-21 (×3): 40 mg via SUBCUTANEOUS
  Filled 2024-09-18 (×3): qty 0.4

## 2024-09-18 MED ORDER — ACETAMINOPHEN 325 MG PO TABS: 650.0000 mg | ORAL_TABLET | Freq: Four times a day (QID) | ORAL | Status: DC | PRN

## 2024-09-18 MED ORDER — LACTATED RINGERS IV BOLUS
500.0000 mL | Freq: Once | INTRAVENOUS | Status: AC
  Administered 2024-09-18: 500 mL via INTRAVENOUS

## 2024-09-18 MED ORDER — SODIUM CHLORIDE 0.9 % IV SOLN: INTRAVENOUS | Status: AC

## 2024-09-18 MED ORDER — INSULIN ASPART 100 UNIT/ML IJ SOLN
0.0000 [IU] | Freq: Three times a day (TID) | INTRAMUSCULAR | Status: DC
  Administered 2024-09-19: 2 [IU] via SUBCUTANEOUS
  Administered 2024-09-19: 5 [IU] via SUBCUTANEOUS
  Administered 2024-09-19 – 2024-09-20 (×3): 3 [IU] via SUBCUTANEOUS
  Filled 2024-09-18 (×2): qty 3
  Filled 2024-09-18: qty 2
  Filled 2024-09-18: qty 3
  Filled 2024-09-18: qty 5

## 2024-09-18 MED ORDER — ACETAMINOPHEN 650 MG RE SUPP: 650.0000 mg | Freq: Four times a day (QID) | RECTAL | Status: DC | PRN

## 2024-09-18 NOTE — Progress Notes (Signed)
 Pt arrived to [facility] from [facility]. Paged [facility] Admit.

## 2024-09-18 NOTE — ED Provider Notes (Signed)
 " [facility] EMERGENCY DEPARTMENT AT [facility] Provider Note   CSN: [id number] Arrival date & time: 09/18/24  1416     Patient presents with: Kristi Willis is a 84 y.o. female.   HPI Patient daughter reports that the patient has progressive primary aphasia.  She has also been getting some increasing weakness over the past month.  She is living alone however, the patient's daughter is taking her out to eat and bringing in meals.  The patient does not cook or prepare her own food.  Patient's daughter reports that the patient is mostly going to the bathroom independently but really over the past few days or weeks has gotten much more weak and unsteady.  She moves very very slowly.  Patient's daughter reports today when she went into check on her, she found her mother sitting in the floor unable to get back up again.  She could not identify any obvious injury.  Patient does have a great left toe that seems painful.  However she has had problems with a fungal infection of it as well and her daughter is unsure if it is an injury or progression or basically the same process that is been going on for a while.  At baseline, the patient mostly just smiles.  Patient's daughter reports that she cannot communicate a lot more than that.  Sometimes she is able to write a note to her mother and she might put a checkmark by it.  Today when she got her up on the floor she sat her in the chair and she was listing to 1 side, she had a difficult time getting her to sit up straight.  Also the patient did not want to eat.  So these 2 things were pretty significant change.  Patient's daughter contacted their nurse from their doctor's office, and was advised to come to the emergency department.  Patient has had a chronic somewhat pruritic rash around her chest and back and extremities.  This has been seen by multiple providers and biopsied.  Patient's daughter reports that they were told to put some  hydrocortisone on it and try to avoid scratching.  No specific diagnosis.    Prior to Admission medications  Medication Sig Start Date End Date Taking? Authorizing Provider  Accu-Chek Softclix Lancets lancets CHECK BLOOD SUGAR ONCE DAILY OR AS DIRECTED Dx E11.40 11/23/20   Lavell Lye A, FNP  acyclovir  ointment (ZOVIRAX ) 5 % APPLY TO THE AFFECTED AREA AROUND MOUTH EVERY 3 HOURS AS DIRECTED UNTIL CLEAR 05/31/19   Hawks, Christy A, FNP  albuterol  (VENTOLIN  HFA) 108 (90 Base) MCG/ACT inhaler INHALE TWO PUFFS EVERY 6 HOURS AS NEEDED FOR WHEEZING OR SHORTNESS OF BREATH 09/08/23   Lavell Lye A, FNP  amLODipine  (NORVASC ) 5 MG tablet TAKE ONE TABLET BY MOUTH DAILY 07/28/24   Lavell Lye A, FNP  amoxicillin -clavulanate (AUGMENTIN ) 875-125 MG tablet Take 1 tablet by mouth 2 (two) times daily. 06/01/24   Gladis Mary-Margaret, FNP  aspirin  81 MG chewable tablet Chew 81 mg by mouth at bedtime.    [provider]  azelastine  (ASTELIN ) 0.1 % nasal spray Place 2 sprays into both nostrils 2 (two) times daily. 08/24/14   Pennie Elsie PARAS, FNP  benzonatate  (TESSALON  PERLES) 100 MG capsule Take 1 capsule (100 mg total) by mouth 3 (three) times daily as needed for cough. 06/01/24   Gladis, Mary-Margaret, FNP  budesonide  (PULMICORT ) 0.25 MG/2ML nebulizer solution Take 2 mLs (0.25 mg total) by nebulization  2 (two) times daily. 11/23/23   Regalado, Belkys A, MD  cetirizine  (ZYRTEC ) 10 MG tablet TAKE ONE TABLET DAILY 04/09/23   Lavell Lye A, FNP  Coenzyme Q10 (COQ10 PO) Take 1 tablet by mouth daily.    [provider]  cyanocobalamin  (VITAMIN B12) 500 MCG tablet Take 1 tablet (500 mcg total) by mouth daily. 11/24/23   Regalado, Belkys A, MD  doxazosin  (CARDURA ) 8 MG tablet TAKE ONE TABLET DAILY FOR BLOOD PRESSURE 05/25/24   Hawks, Christy A, FNP  EPIPEN 2-PAK 0.3 MG/0.3ML SOAJ injection Inject 0.3 mg into the muscle as needed for anaphylaxis. Reported on 01/24/2016 03/07/14   [provider]   escitalopram  (LEXAPRO ) 20 MG tablet TAKE ONE TABLET BY MOUTH DAILY 07/08/24   Lavell Lye A, FNP  famotidine  (PEPCID ) 20 MG tablet Take 1 tablet (20 mg total) by mouth 2 (two) times daily. 05/20/24   Lavell Lye A, FNP  fluticasone (FLONASE) 50 MCG/ACT nasal spray Place 1 spray into both nostrils daily. 09/09/23   [provider]  furosemide  (LASIX ) 20 MG tablet Take 1 tablet (20 mg total) by mouth daily. Patient not taking: Reported on 06/01/2024 11/28/23   Lavell Lye LABOR, FNP  furosemide  (LASIX ) 40 MG tablet Take 1 tablet (40 mg total) by mouth daily for 7 days. 11/23/23 06/01/24  Regalado, Belkys A, MD  glucose blood (ACCU-CHEK AVIVA PLUS) test strip CHECK BLOOD SUGAR ONCE DAILY OR AS DIRECTED Dx E11.40 11/23/20   Lavell Lye A, FNP  guaiFENesin  (MUCINEX ) 600 MG 12 hr tablet Take 2 tablets (1,200 mg total) by mouth 2 (two) times daily. 11/23/23   Regalado, Belkys A, MD  ipratropium-albuterol  (DUONEB) 0.5-2.5 (3) MG/3ML SOLN Take 3 mLs by nebulization every 6 (six) hours. 11/23/23   Regalado, Belkys A, MD  Iron , Ferrous Sulfate , 325 (65 Fe) MG TABS Take 325 mg by mouth daily. 04/13/24   Lavell Lye A, FNP  ketoconazole  (NIZORAL ) 2 % cream Apply 1 application topically daily. 05/08/21   Lavell Lye LABOR, FNP  levothyroxine  (SYNTHROID ) 75 MCG tablet TAKE ONE TABLET DAILY BEFORE BREAKFAST 02/17/24   Lavell Lye A, FNP  lisinopril  (ZESTRIL ) 40 MG tablet TAKE ONE TABLET DAILY Patient not taking: Reported on 06/01/2024 12/11/23   Lavell Lye LABOR, FNP  metFORMIN  (GLUCOPHAGE ) 1000 MG tablet TAKE ONE TABLET TWICE DAILY WITH MEAL(S) 06/08/24   Lavell Lye LABOR, FNP  metoprolol  succinate (TOPROL -XL) 100 MG 24 hr tablet TAKE ONE TABLET DAILY 07/08/24   Lavell Lye A, FNP  montelukast  (SINGULAIR ) 10 MG tablet TAKE ONE TABLET AT BEDTIME 07/08/24   Hawks, Lye LABOR, FNP  predniSONE  (STERAPRED UNI-PAK 21 TAB) 10 MG (21) TBPK tablet Use as directed 06/15/24   Lavell Lye A, FNP  rosuvastatin   (CRESTOR ) 20 MG tablet TAKE ONE TABLET AT BEDTIME 07/01/24   Lavell Lye A, FNP  sitaGLIPtin  (JANUVIA ) 50 MG tablet TAKE ONE TABLET ONCE DAILY 08/02/24   Lavell Lye A, FNP  triamcinolone  ointment (KENALOG ) 0.5 % Apply 1 Application topically 2 (two) times daily. 04/23/24   Lavell Lye LABOR, FNP    Allergies: Dimetapp c [phenylephrine-bromphen-codeine], Phenylephrine hcl, Shingrix [zoster vac recomb adjuvanted], Ultram [tramadol hcl], Celebrex [celecoxib], Cortisone, Fenofibrate, and Sulfa antibiotics    Review of Systems  Updated Vital Signs BP (!) 122/94 (BP Location: Left Arm)   Pulse 79   Temp 98.2 F (36.8 C) (Oral)   Resp 16   Ht 5' 4 (1.626 m)   Wt 69.9 kg   SpO2 99%  BMI 26.45 kg/m   Physical Exam Constitutional:      Comments: Patient is awake and alert.  She does not appear to be in acute distress.  No respiratory distress at rest.  She has a slightly blank expression on her face with a constant small smile.  This does not vary much.  HENT:     Head: Normocephalic and atraumatic.     Nose: Nose normal.     Mouth/Throat:     Mouth: Mucous membranes are moist.     Pharynx: Oropharynx is clear.  Eyes:     Extraocular Movements: Extraocular movements intact.     Conjunctiva/sclera: Conjunctivae normal.  Cardiovascular:     Rate and Rhythm: Normal rate and regular rhythm.  Pulmonary:     Effort: Pulmonary effort is normal.     Breath sounds: Normal breath sounds.  Abdominal:     General: There is no distension.     Palpations: Abdomen is soft.     Tenderness: There is no abdominal tenderness. There is no guarding.  Musculoskeletal:     Comments: Patient seems to have a lot of neuromuscular stiffness of the right upper extremity.  It is fairly rigid but can straighten.  Left upper extremity is more mobile with passive range of motion.  A bilateral lower extremities: Patient does do some spontaneous movement and shaking of the lower extremities.  Condition of the  foot and leg on the right is very good.  Distal pulses 2+.  The foot is warm and dry.  The left great toe has advanced fungal disease of the nail and surrounding tissues of the toe with thick cornified skin.  The foot feels slightly warmer to the touch and possible mild erythema.  Skin:    General: Skin is warm and dry.     Findings: Rash present.  Neurological:     Comments: Patient has a placid fixed expression on her face.  This does not change much.  She is limited in capacity to follow commands or interact verbally.  If I speak to her ask a question she starts to make a little bit of a mumbling response but it does not end up forming words and then she stops and has the same expression.  She is limited in her capacity to follow instructions.  Patient did try to help somewhat in sitting forward when asked to for pulmonary exam.  When asked to open her mouth she smiled a bit made some babbling vocalizations but could not fully comply.  She seems to have some rigidity of the right upper extremity.  Left upper extremity has more fluid passive range of motion.  Patient is doing spontaneous movement of the lower extremities.  He does not follow commands for strength testing.     (all labs ordered are listed, but only abnormal results are displayed) Labs Reviewed  COMPREHENSIVE METABOLIC PANEL WITH GFR - Abnormal; Notable for the following components:      Result Value   Glucose, Bld 301 (*)    BUN 30 (*)    Alkaline Phosphatase 154 (*)    Anion gap 17 (*)    All other components within normal limits  CBC WITH DIFFERENTIAL/PLATELET - Abnormal; Notable for the following components:   RBC 3.85 (*)    Hemoglobin 10.7 (*)    HCT 32.6 (*)    Neutro Abs 8.2 (*)    All other components within normal limits  URINALYSIS, ROUTINE W REFLEX MICROSCOPIC - Abnormal; Notable  for the following components:   Glucose, UA >1,000 (*)    Ketones, ur TRACE (*)    Protein, ur 30 (*)    All other components within  normal limits  PRO BRAIN NATRIURETIC PEPTIDE  TROPONIN T, HIGH SENSITIVITY  TROPONIN T, HIGH SENSITIVITY    EKG: None  Radiology: CT Head Wo Contrast Result Date: 09/18/2024 EXAM: CT HEAD WITHOUT CONTRAST 09/18/2024 03:23:23 PM TECHNIQUE: CT of the head was performed without the administration of intravenous contrast. Automated exposure control, iterative reconstruction, and/or weight based adjustment of the mA/kV was utilized to reduce the radiation dose to as low as reasonably achievable. COMPARISON: CT head without contrast 09/26/2021. CLINICAL HISTORY: Head trauma, minor (Age >= 65y). Unwitnessed fall. Decreased responsiveness. FINDINGS: BRAIN AND VENTRICLES: No acute hemorrhage. No evidence of acute infarct. No hydrocephalus. No extra-axial collection. No mass effect or midline shift. White matter changes are stable, within normal limits for age. Atherosclerotic calcifications are present in the cavernous carotid arteries bilaterally. No hyperdense vessel is present. ORBITS: No acute abnormality. SINUSES: Mild mucosal thickening is present along the inferior left maxillary sinus. SOFT TISSUES AND SKULL: No acute soft tissue abnormality. No skull fracture. IMPRESSION: 1. No acute intracranial abnormality. Electronically signed by: Lonni Necessary MD 09/18/2024 03:33 PM EST RP Workstation: [id number]   DG Foot Complete Left Result Date: 09/18/2024 CLINICAL DATA:  Unwitnessed fall.  Injury to left great toe. EXAM: LEFT FOOT - COMPLETE 3+ VIEW COMPARISON:  02/04/2016 FINDINGS: There is no evidence of acute fracture or dislocation. Degenerative changes are noted in the midfoot. There is a large calcaneal spur. Soft tissue swelling with defect in the subcutaneous tissues is noted at the distal tip of the first digit. IMPRESSION: Soft tissue defect at the tip of the first digit. No acute fracture is seen. Electronically Signed   By: Leita Birmingham M.D.   On: 09/18/2024 15:28   DG Chest Port 1  View Result Date: 09/18/2024 CLINICAL DATA:  fall EXAM: PORTABLE CHEST 1 VIEW COMPARISON:  November 20, 2023 FINDINGS: The cardiomediastinal silhouette is unchanged and enlarged in contour.Low lung volume radiograph. No pleural effusion. No pneumothorax. No acute pleuroparenchymal abnormality. IMPRESSION: No acute cardiopulmonary abnormality. Electronically Signed   By: Corean Salter M.D.   On: 09/18/2024 15:22     Procedures   Medications Ordered in the ED  lactated ringers  bolus 500 mL (500 mLs Intravenous New Bag/Given 09/18/24 1801)                                    Medical Decision Making Amount and/or Complexity of Data Reviewed Labs: ordered. Radiology: ordered.  Risk Decision regarding hospitalization.   Patient complex and progressive neurodegenerative condition.  Acutely patient's daughter finds that she is weaker and was found sitting on the floor unable to get back up without significant assistance.  She did not eat and was listing to the side.  Unclear if there may be associated head injury or metabolic derangement.  Will proceed with CT head and diagnostic evaluation.  Vital signs are stable.  Patient's respiratory status is stable.  Troponin 11 urinalysis negative BNP 260 GFR greater than 60 anion gap 17 blood glucose 30 BUN 30 white count 10.5 H&H 10.7 and 32  CT head interpreted by radiology no acute findings.  Chest no acute cardiopulmonary process by radiology.  At this time patient does appear slightly dehydrated.  She had altered mental  status and general weakness.  She does have slight anion gap and elevated BUN.  She has not been eating and drinking.  She does have comorbid neuro degenerative or neurocognitive disorder manifest as primary progressive aphasia but does require assistance at home for ADLs although has been functioning independently for periods of time during the day.  At this time we will plan for admission for acute mental status change from  baseline and general weakness.  Consult: [facility]     Final diagnoses:  Dehydration  Altered mental status, unspecified altered mental status type  Neurodegenerative disorder  Primary progressive aphasia [facility])    ED Discharge Orders     None          Armenta Canning, MD 09/18/24 1906  "

## 2024-09-18 NOTE — H&P (Signed)
 " History and Physical    Kristi Willis FMW:[id number] DOB: 12-02-1940 DOA: 09/18/2024  PCP: Lavell Bari DELENA, FNP  Patient coming from: Home  Chief Complaint: Generalized weakness  HPI: Kristi Willis is a 84 y.o. female with medical history significant of primary progressive aphasia/nonverbal at baseline, hypertension, hyperlipidemia, type 2 diabetes, peripheral neuropathy, SVT, GERD, asthma, iron  deficiency anemia, hypothyroidism presented to the ED for evaluation of generalized weakness, altered mental status, recent fall, left great toe pain, and poor appetite.  Patient is not able to give any history given known aphasia.  No family available at this time.  Per review of ED documentation, family reported patient having increasing weakness and unsteadiness over the past month.  Patient lives alone and daughter usually checks on her and brings her meals.  Daughter went to check on the patient today and found her sitting on the floor unable to get back up.  After family helped her get up in a chair, patient was noted to be leaning to one side with difficulty sitting up straight.  She did not want to eat.  Patient's nurse was contacted by daughter and was advised to take the patient to the emergency department.  Patient also noted to have chronic fungal infection of her left great toe.  Left great toe seemed painful but it was uncertain if she sustained any injury when she fell.  At baseline, patient mostly just smiles and cannot communicate more than that.  She has a chronic pruritic rash on her chest, back, and extremities for which reportedly biopsy was done and she has been evaluated by multiple providers.  She was advised to abstain from scratching her skin and to use hydrocortisone cream.  ED Course: Vitals on arrival: Temperature 98.2 F, pulse 65, respiratory rate 18, blood pressure 149/51, and SpO2 99% on room air.  Labs showing WBC count 10.5, hemoglobin 10.7 (stable), glucose 301,  BUN 30, creatinine 0.8, alk phos 154, transaminases and T. bili normal, troponin negative x 2, proBNP normal, UA not suggestive of infection.  Chest x-ray showing no acute cardiopulmonary abnormality.  X-ray of left foot showing soft tissue defect at the tip of the first digit but no acute fracture.  CT head showing no acute intracranial abnormality.  Patient was given 500 mL LR.  Review of Systems:  Review of Systems  Reason unable to perform ROS: Patient is nonverbal.    Past Medical History:  Diagnosis Date   Allergy     Asthma    Cancer (HCC)    skin   Diabetes mellitus    GERD (gastroesophageal reflux disease)    hiatal hernia   Hypercholesteremia    Hypertension    Neuromuscular disorder (HCC)    DM neuropathy   Neuropathy    SVT (supraventricular tachycardia)     Past Surgical History:  Procedure Laterality Date   BREAST BIOPSY     left breast biopsy   LUMBAR LAMINECTOMY  08/10   SKIN CANCER EXCISION  2005   SPINE SURGERY     lumbar laminectomy     reports that she has never smoked. She has never used smokeless tobacco. She reports that she does not drink alcohol and does not use drugs.  Allergies[1]  Family History  Problem Relation Age of Onset   Cancer Mother 21       cervical cancer   Depression Mother    Heart disease Father    Stroke Father    Cancer Father  lung cancer   Heart disease Sister 28       MI   Hypertension Sister    Cancer Sister 41       breast cancer   Healthy Daughter     Prior to Admission medications  Medication Sig Start Date End Date Taking? Authorizing Provider  Accu-Chek Softclix Lancets lancets CHECK BLOOD SUGAR ONCE DAILY OR AS DIRECTED Dx E11.40 11/23/20   Lavell Lye A, FNP  acyclovir  ointment (ZOVIRAX ) 5 % APPLY TO THE AFFECTED AREA AROUND MOUTH EVERY 3 HOURS AS DIRECTED UNTIL CLEAR 05/31/19   Hawks, Christy A, FNP  albuterol  (VENTOLIN  HFA) 108 (90 Base) MCG/ACT inhaler INHALE TWO PUFFS EVERY 6 HOURS AS NEEDED  FOR WHEEZING OR SHORTNESS OF BREATH 09/08/23   Lavell Lye A, FNP  amLODipine  (NORVASC ) 5 MG tablet TAKE ONE TABLET BY MOUTH DAILY 07/28/24   Lavell Lye A, FNP  amoxicillin -clavulanate (AUGMENTIN ) 875-125 MG tablet Take 1 tablet by mouth 2 (two) times daily. 06/01/24   Gladis Mustard, FNP  aspirin  81 MG chewable tablet Chew 81 mg by mouth at bedtime.    [provider]  azelastine  (ASTELIN ) 0.1 % nasal spray Place 2 sprays into both nostrils 2 (two) times daily. 08/24/14   Pennie Elsie PARAS, FNP  benzonatate  (TESSALON  PERLES) 100 MG capsule Take 1 capsule (100 mg total) by mouth 3 (three) times daily as needed for cough. 06/01/24   Gladis, Mary-Margaret, FNP  budesonide  (PULMICORT ) 0.25 MG/2ML nebulizer solution Take 2 mLs (0.25 mg total) by nebulization 2 (two) times daily. 11/23/23   Regalado, Owen A, MD  cetirizine  (ZYRTEC ) 10 MG tablet TAKE ONE TABLET DAILY 04/09/23   Lavell Lye A, FNP  Coenzyme Q10 (COQ10 PO) Take 1 tablet by mouth daily.    [provider]  cyanocobalamin  (VITAMIN B12) 500 MCG tablet Take 1 tablet (500 mcg total) by mouth daily. 11/24/23   Regalado, Belkys A, MD  doxazosin  (CARDURA ) 8 MG tablet TAKE ONE TABLET DAILY FOR BLOOD PRESSURE 05/25/24   Hawks, Christy A, FNP  EPIPEN 2-PAK 0.3 MG/0.3ML SOAJ injection Inject 0.3 mg into the muscle as needed for anaphylaxis. Reported on 01/24/2016 03/07/14   [provider]  escitalopram  (LEXAPRO ) 20 MG tablet TAKE ONE TABLET BY MOUTH DAILY 07/08/24   Lavell Lye A, FNP  famotidine  (PEPCID ) 20 MG tablet Take 1 tablet (20 mg total) by mouth 2 (two) times daily. 05/20/24   Lavell Lye A, FNP  fluticasone (FLONASE) 50 MCG/ACT nasal spray Place 1 spray into both nostrils daily. 09/09/23   [provider]  furosemide  (LASIX ) 20 MG tablet Take 1 tablet (20 mg total) by mouth daily. Patient not taking: Reported on 06/01/2024 11/28/23   Lavell Lye LABOR, FNP  furosemide  (LASIX ) 40 MG tablet Take 1  tablet (40 mg total) by mouth daily for 7 days. 11/23/23 06/01/24  Regalado, Belkys A, MD  glucose blood (ACCU-CHEK AVIVA PLUS) test strip CHECK BLOOD SUGAR ONCE DAILY OR AS DIRECTED Dx E11.40 11/23/20   Lavell Lye A, FNP  guaiFENesin  (MUCINEX ) 600 MG 12 hr tablet Take 2 tablets (1,200 mg total) by mouth 2 (two) times daily. 11/23/23   Regalado, Belkys A, MD  ipratropium-albuterol  (DUONEB) 0.5-2.5 (3) MG/3ML SOLN Take 3 mLs by nebulization every 6 (six) hours. 11/23/23   Regalado, Belkys A, MD  Iron , Ferrous Sulfate , 325 (65 Fe) MG TABS Take 325 mg by mouth daily. 04/13/24   Lavell Lye A, FNP  ketoconazole  (NIZORAL ) 2 % cream Apply 1 application  topically daily. 05/08/21   Lavell Bari LABOR, FNP  levothyroxine  (SYNTHROID ) 75 MCG tablet TAKE ONE TABLET DAILY BEFORE BREAKFAST 02/17/24   Lavell Bari A, FNP  lisinopril  (ZESTRIL ) 40 MG tablet TAKE ONE TABLET DAILY Patient not taking: Reported on 06/01/2024 12/11/23   Lavell Bari LABOR, FNP  metFORMIN  (GLUCOPHAGE ) 1000 MG tablet TAKE ONE TABLET TWICE DAILY WITH MEAL(S) 06/08/24   Lavell Bari LABOR, FNP  metoprolol  succinate (TOPROL -XL) 100 MG 24 hr tablet TAKE ONE TABLET DAILY 07/08/24   Lavell Bari A, FNP  montelukast  (SINGULAIR ) 10 MG tablet TAKE ONE TABLET AT BEDTIME 07/08/24   Hawks, Bari A, FNP  predniSONE  (STERAPRED UNI-PAK 21 TAB) 10 MG (21) TBPK tablet Use as directed 06/15/24   Lavell Bari A, FNP  rosuvastatin  (CRESTOR ) 20 MG tablet TAKE ONE TABLET AT BEDTIME 07/01/24   Lavell Bari A, FNP  sitaGLIPtin  (JANUVIA ) 50 MG tablet TAKE ONE TABLET ONCE DAILY 08/02/24   Lavell Bari A, FNP  triamcinolone  ointment (KENALOG ) 0.5 % Apply 1 Application topically 2 (two) times daily. 04/23/24   Lavell Bari LABOR, FNP    Physical Exam: Vitals:   09/18/24 1630 09/18/24 1700 09/18/24 1946 09/18/24 2036  BP: (!) 162/81 (!) 122/94  (!) 174/56  Pulse: 74 79  78  Resp: 16 16  18   Temp:   98.7 F (37.1 C) 98.4 F (36.9 C)  TempSrc:   Oral   SpO2: 97%  99%  97%  Weight:      Height:        Physical Exam Vitals reviewed.  Constitutional:      General: She is not in acute distress. HENT:     Head: Normocephalic and atraumatic.  Eyes:     Extraocular Movements: Extraocular movements intact.  Cardiovascular:     Rate and Rhythm: Normal rate and regular rhythm.     Heart sounds: Normal heart sounds.  Pulmonary:     Effort: Pulmonary effort is normal. No respiratory distress.     Breath sounds: Normal breath sounds.  Abdominal:     General: Bowel sounds are normal.     Palpations: Abdomen is soft.     Tenderness: There is no abdominal tenderness. There is no guarding.  Musculoskeletal:     Right lower leg: No edema.     Left lower leg: No edema.     Comments: Left great toe appears swollen but no obvious signs of cellulitis.  Dorsalis pedis pulse palpable bilaterally. Onychomycosis of several toes.  Skin:    General: Skin is warm and dry.  Neurological:     Mental Status: She is alert.     Comments: Has aphasia and is not able to communicate, mumbling, smiling Does not follow commands but noted to be moving all of her extremities spontaneously, no focal weakness     Labs on Admission: I have personally reviewed following labs and imaging studies  CBC: Recent Labs  Lab 09/18/24 1539  WBC 10.5  NEUTROABS 8.2*  HGB 10.7*  HCT 32.6*  MCV 84.7  PLT 200   Basic Metabolic Panel: Recent Labs  Lab 09/18/24 1539  NA 138  K 4.2  CL 98  CO2 24  GLUCOSE 301*  BUN 30*  CREATININE 0.80  CALCIUM  9.5   GFR: Estimated Creatinine Clearance: 51.1 mL/min (by C-G formula based on SCr of 0.8 mg/dL). Liver Function Tests: Recent Labs  Lab 09/18/24 1539  AST 28  ALT 16  ALKPHOS 154*  BILITOT 0.4  PROT 6.8  ALBUMIN 4.3   No results for input(s): LIPASE, AMYLASE in the last 168 hours. No results for input(s): AMMONIA in the last 168 hours. Coagulation Profile: No results for input(s): INR, PROTIME in the last  168 hours. Cardiac Enzymes: No results for input(s): CKTOTAL, CKMB, CKMBINDEX, TROPONINI in the last 168 hours. BNP (last 3 results) Recent Labs    09/18/24 1539  PROBNP 260.0   HbA1C: No results for input(s): HGBA1C in the last 72 hours. CBG: No results for input(s): GLUCAP in the last 168 hours. Lipid Profile: No results for input(s): CHOL, HDL, LDLCALC, TRIG, CHOLHDL, LDLDIRECT in the last 72 hours. Thyroid  Function Tests: No results for input(s): TSH, T4TOTAL, FREET4, T3FREE, THYROIDAB in the last 72 hours. Anemia Panel: No results for input(s): VITAMINB12, FOLATE, FERRITIN, TIBC, IRON , RETICCTPCT in the last 72 hours. Urine analysis:    Component Value Date/Time   COLORURINE YELLOW 09/18/2024 1601   APPEARANCEUR CLEAR 09/18/2024 1601   APPEARANCEUR Clear 11/28/2023 0000   LABSPEC 1.026 09/18/2024 1601   PHURINE 5.0 09/18/2024 1601   GLUCOSEU >1,000 (A) 09/18/2024 1601   HGBUR NEGATIVE 09/18/2024 1601   BILIRUBINUR NEGATIVE 09/18/2024 1601   BILIRUBINUR Negative 11/28/2023 0000   KETONESUR TRACE (A) 09/18/2024 1601   PROTEINUR 30 (A) 09/18/2024 1601   UROBILINOGEN 0.2 02/14/2012 0836   NITRITE NEGATIVE 09/18/2024 1601   LEUKOCYTESUR NEGATIVE 09/18/2024 1601    Radiological Exams on Admission: CT Head Wo Contrast Result Date: 09/18/2024 EXAM: CT HEAD WITHOUT CONTRAST 09/18/2024 03:23:23 PM TECHNIQUE: CT of the head was performed without the administration of intravenous contrast. Automated exposure control, iterative reconstruction, and/or weight based adjustment of the mA/kV was utilized to reduce the radiation dose to as low as reasonably achievable. COMPARISON: CT head without contrast 09/26/2021. CLINICAL HISTORY: Head trauma, minor (Age >= 65y). Unwitnessed fall. Decreased responsiveness. FINDINGS: BRAIN AND VENTRICLES: No acute hemorrhage. No evidence of acute infarct. No hydrocephalus. No extra-axial collection. No mass  effect or midline shift. White matter changes are stable, within normal limits for age. Atherosclerotic calcifications are present in the cavernous carotid arteries bilaterally. No hyperdense vessel is present. ORBITS: No acute abnormality. SINUSES: Mild mucosal thickening is present along the inferior left maxillary sinus. SOFT TISSUES AND SKULL: No acute soft tissue abnormality. No skull fracture. IMPRESSION: 1. No acute intracranial abnormality. Electronically signed by: Lonni Necessary MD 09/18/2024 03:33 PM EST RP Workstation: [id number]   DG Foot Complete Left Result Date: 09/18/2024 CLINICAL DATA:  Unwitnessed fall.  Injury to left great toe. EXAM: LEFT FOOT - COMPLETE 3+ VIEW COMPARISON:  02/04/2016 FINDINGS: There is no evidence of acute fracture or dislocation. Degenerative changes are noted in the midfoot. There is a large calcaneal spur. Soft tissue swelling with defect in the subcutaneous tissues is noted at the distal tip of the first digit. IMPRESSION: Soft tissue defect at the tip of the first digit. No acute fracture is seen. Electronically Signed   By: Leita Birmingham M.D.   On: 09/18/2024 15:28   DG Chest Port 1 View Result Date: 09/18/2024 CLINICAL DATA:  fall EXAM: PORTABLE CHEST 1 VIEW COMPARISON:  November 20, 2023 FINDINGS: The cardiomediastinal silhouette is unchanged and enlarged in contour.Low lung volume radiograph. No pleural effusion. No pneumothorax. No acute pleuroparenchymal abnormality. IMPRESSION: No acute cardiopulmonary abnormality. Electronically Signed   By: Corean Salter M.D.   On: 09/18/2024 15:22    EKG: Pending at this time.  Assessment and Plan  Acute metabolic encephalopathy Dehydration possibly contributing.  Patient  is nonverbal at baseline due to history of progressive aphasia.  Examination limited as she does not follow commands but noted to be moving all of her extremities spontaneously, no focal weakness.  CT head showing no acute intracranial  abnormality.  UA not suggestive of infection.  Check TSH, B12, and ammonia level.  Generalized weakness/fall No acute injuries identified on imaging including CT head, chest x-ray, and x-ray of left foot.  PT/OT eval, fall precautions.  Dehydration Poor p.o. intake reported by family and BUN elevated on labs.  Continue gentle IV fluid hydration.  Type 2 diabetes with hyperglycemia Metformin  and Januvia  listed as prior home medications but this needs to be confirmed.  Glucose 301 without signs of DKA.  Last hemoglobin A1c 7.1 in August 2025, repeat ordered.  Placed on sensitive sliding scale insulin  ACHS.  Primary progressive aphasia/nonverbal at baseline Supportive care.  Chronic anemia Hemoglobin stable.  Hypothyroidism: Checking TSH as above. Hypertension: IV hydralazine  PRN SBP >160. Hyperlipidemia Peripheral neuropathy GERD Asthma: Stable, no signs of acute exacerbation.  Albuterol  PRN. Unable to verify home medications with the patient as she is nonverbal.  No family available at this time.  Awaiting pharmacy med rec.  DVT prophylaxis: Lovenox  Code Status: Full code by default.  Unable to discuss CODE STATUS with the patient as she is nonverbal.  No family available at this time. Level of care: Med-Surg Admission status: It is my clinical opinion that admission to INPATIENT is reasonable and necessary because of the expectation that this patient will require hospital care that crosses at least 2 midnights to treat this condition based on the medical complexity of the problems presented.  Given the aforementioned information, the predictability of an adverse outcome is felt to be significant.  Editha Ram MD [facility]  If 7PM-7AM, please contact night-coverage [email or web address]  09/18/2024, 8:55 PM       [1]  Allergies Allergen Reactions   Dimetapp C [Phenylephrine-Bromphen-Codeine] Itching   Phenylephrine Hcl Itching   Shingrix [Zoster Vac Recomb  Adjuvanted] Other (See Comments)    Swelling and fever    Ultram [Tramadol Hcl] Other (See Comments)    Insomnia   Celebrex [Celecoxib] Rash   Cortisone Rash   Fenofibrate Rash   Sulfa Antibiotics Rash   "

## 2024-09-18 NOTE — Progress Notes (Signed)
 Plan of Care Note for accepted transfer   Patient: Kristi Willis MRN: [id number]   DOA: 09/18/2024  Facility requesting transfer: [facility] Requesting Provider: Armenta Canning, MD Reason for transfer: Altered mental status   Plan of care: The patient is accepted for admission to Med-surg  unit, at [facility]..   Kristi Willis is an 84 y.o. female with history of primary progressive aphasia, diabetes mellitus type 2, hypertension, hypothyroidism who presents to the emergency department accompanied by daughter due to fall sustained at home today.  Patient was reported to have had increasing weakness with unsteadiness over the past month, she lives alone and daughter usually checks on her and brings her meals.  Daughter went to check on her today and found her sitting on the floor unable to get back up.  After getting her up in the chair, she was noted to lean to 1 side with difficulty in being able to sit up straight.  She also did not want to eat.  Patient's nurse was contacted by daughter and was advised to take the patient to the emergency department.  Patient has chronic fungal infection of left great toe.  Left great toe seems painful and it was uncertain if she sustained an injury when she fell.  At baseline, patient mostly just smiles and cannot communicate more than that. She has a chronic pruritic rash on her chest, back and extremities.  Biopsy has been done and she has been seen by multiple providers.  She was advised to abstain from scratching this and to use hydrocortisone cream.  Author: Brad Mcgaughy, DO 09/18/2024  Check [email or web address] for on-call coverage.  Nursing staff, Please call [facility] Admits [facility] Consults [facility]-Wide number on Amion as soon as patient's arrival, so appropriate admitting provider can evaluate the pt.

## 2024-09-18 NOTE — Progress Notes (Signed)
 ECG performed and transmitted. Provider made aware.

## 2024-09-18 NOTE — ED Triage Notes (Signed)
 Pt via pov from home with daughter, who reports that pt fell this morning. Fall was unwitnessed. Pt may have injury to great left toe. Daughter states that pt was less responsive than usual and leaning to the right while sitting. She called nursing line, who told her to bring her to ed if this was unusual for pt. Pt is non-verbal due to primary progressive aphasia. Pt alert & acting appropriately during triage. NAD noted.

## 2024-09-19 DIAGNOSIS — R4182 Altered mental status, unspecified: Secondary | ICD-10-CM | POA: Insufficient documentation

## 2024-09-19 LAB — BASIC METABOLIC PANEL WITH GFR
Anion gap: 14 (ref 5–15)
BUN: 13 mg/dL (ref 8–23)
CO2: 24 mmol/L (ref 22–32)
Calcium: 9.2 mg/dL (ref 8.9–10.3)
Chloride: 100 mmol/L (ref 98–111)
Creatinine, Ser: 0.83 mg/dL (ref 0.44–1.00)
GFR, Estimated: >60 mL/min
Glucose, Bld: 305 mg/dL — ABNORMAL HIGH (ref 70–99)
Potassium: 3.8 mmol/L (ref 3.5–5.1)
Sodium: 138 mmol/L (ref 135–145)

## 2024-09-19 LAB — GLUCOSE, CAPILLARY
Glucose-Capillary: 193 mg/dL — ABNORMAL HIGH (ref 70–99)
Glucose-Capillary: 217 mg/dL — ABNORMAL HIGH (ref 70–99)
Glucose-Capillary: 260 mg/dL — ABNORMAL HIGH (ref 70–99)
Glucose-Capillary: 230 mg/dL — ABNORMAL HIGH (ref 70–99)

## 2024-09-19 LAB — HEMOGLOBIN A1C
Hgb A1c MFr Bld: 8.4 % — ABNORMAL HIGH (ref 4.8–5.6)
Mean Plasma Glucose: 194.38 mg/dL

## 2024-09-19 MED ORDER — AMLODIPINE BESYLATE 5 MG PO TABS
5.0000 mg | ORAL_TABLET | Freq: Every day | ORAL | Status: DC
  Administered 2024-09-20 – 2024-09-21 (×2): 5 mg via ORAL
  Filled 2024-09-19 (×2): qty 1

## 2024-09-19 MED ORDER — VITAMIN B-12 1000 MCG PO TABS
500.0000 ug | ORAL_TABLET | Freq: Every day | ORAL | Status: DC
  Administered 2024-09-20 – 2024-09-21 (×2): 500 ug via ORAL
  Filled 2024-09-19 (×2): qty 1

## 2024-09-19 MED ORDER — LEVOTHYROXINE SODIUM 75 MCG PO TABS: 75.0000 ug | ORAL_TABLET | Freq: Every day | ORAL | Status: DC

## 2024-09-19 MED ORDER — DOXAZOSIN MESYLATE 8 MG PO TABS
8.0000 mg | ORAL_TABLET | Freq: Every day | ORAL | Status: DC
  Administered 2024-09-20 – 2024-09-21 (×2): 8 mg via ORAL
  Filled 2024-09-19 (×2): qty 1

## 2024-09-19 MED ORDER — METOPROLOL SUCCINATE ER 50 MG PO TB24
100.0000 mg | ORAL_TABLET | Freq: Every day | ORAL | Status: DC
  Administered 2024-09-20 – 2024-09-21 (×2): 100 mg via ORAL
  Filled 2024-09-19 (×2): qty 2

## 2024-09-19 MED ORDER — MELATONIN 3 MG PO TABS
3.0000 mg | ORAL_TABLET | Freq: Every evening | ORAL | Status: DC | PRN
  Administered 2024-09-19 – 2024-09-20 (×2): 3 mg via ORAL
  Filled 2024-09-19 (×2): qty 1

## 2024-09-19 MED ORDER — ROSUVASTATIN CALCIUM 10 MG PO TABS
20.0000 mg | ORAL_TABLET | Freq: Every day | ORAL | Status: DC
  Administered 2024-09-19 – 2024-09-20 (×2): 20 mg via ORAL
  Filled 2024-09-19 (×2): qty 2

## 2024-09-19 NOTE — Care Management CC44 (Signed)
"         Condition Code 44 Documentation Completed  Patient Details  Name: Kristi Willis MRN: [id number] Date of Birth: Nov 07, 1940   Condition Code 44 given:  Yes Patient signature on Condition Code 44 notice:  Yes Documentation of 2 MD's agreement:  Yes Code 44 added to claim:  Yes    Sonda Manuella Quill, RN 09/19/2024, 4:17 PM  "

## 2024-09-19 NOTE — Progress Notes (Signed)
 Pt non-verbal unable to complete admission questions.

## 2024-09-19 NOTE — Hospital Course (Addendum)
 Kristi Willis was admitted to the hospital with the working diagnosis of acute metabolic encephalopathy   84 yo female with the past medical history of primary progressive aphasia, non verbal at baseline, hypertension, hyperlipidemia, T2DM, asthma, hypothyroidism and anemia who presented with altered mental status.  Per her family she had progressive generalized weakness over the last 4 weeks prior to admission. On the day of admission she was found on the floor not able to get back up. With difficulty she was placed in a chair, she was noted to lean to the right side and not able to stay sitting up straight. Her daughter called her home nurse and she was instructed to take the patient to the ED.  On her initial physical examination her blood pressure was 162/81, HR 79, RR 18 and 02 saturation 97% Lungs with no wheezing or rhonchi, heart with S1 and S2 present and regular, abdomen soft and non tender and no lower extremity edema.  Left great toe with edema but no erythema, onychomycosis of several toes.  Chronic aphasia, not follow commands and not able to communicate.   Na 138, K 4.2 Cl 98 bicarbonate 24 glucose 301 bun 30 cr 0,80  AST 28 ALT 16  Ammonia 19  Pro BNP 260  High sensitive troponin 13 and 11  B 12 671  Wbc 10,5 hgb 10.7 plt 200  Urine analysis SG 1.026, protein 30, negative leukocytes, negative nitrite, negative hgb, glucose >1000, wbc 0-5, rbc 0-5   CT head with no acute changes.   Chest radiograph with hypoinflation with no effusions or infiltrates.   EKG 73 bpm, normal axis, normal intervals, qtc 489, sinus rhythm with no significant ST segment or T wave changes.

## 2024-09-19 NOTE — Assessment & Plan Note (Signed)
 Resolved with IV fluids Patient is tolerating po well.

## 2024-09-19 NOTE — Plan of Care (Signed)
  Problem: Clinical Measurements: Goal: Ability to maintain clinical measurements within normal limits will improve Outcome: Progressing Goal: Diagnostic test results will improve Outcome: Progressing Goal: Respiratory complications will improve Outcome: Progressing Goal: Cardiovascular complication will be avoided Outcome: Progressing   Problem: Safety: Goal: Ability to remain free from injury will improve Outcome: Progressing

## 2024-09-19 NOTE — Assessment & Plan Note (Signed)
 Resume blood pressure control with metoprolol . Doxazosin  and amlodipine 

## 2024-09-19 NOTE — [place] FL2 (Signed)
 " [place]  MEDICAID FL2 LEVEL OF CARE FORM     IDENTIFICATION  Patient Name: Kristi Willis Birthdate: 1941-05-14 Sex: female Admission Date (Current Location): 09/18/2024  [facility] and [place] Number:  Producer, Television/film/video and Address:  [facility],  [address], [place] [address]      Provider Number: [id number]  Attending Physician Name and Address:  Noralee Elidia Sieving,*  Relative Name and Phone Number:  Randall Bar (daughter) [phone]    Current Level of Care: Hospital Recommended Level of Care: Skilled Nursing Facility Prior Approval Number:    Date Approved/Denied:   PASRR Number: [id number]  Discharge Plan: SNF    Current Diagnoses: Patient Active Problem List   Diagnosis Date Noted   Altered mental status, unspecified 09/19/2024   Dehydration 09/18/2024   Hyperglycemia 09/18/2024   Generalized weakness 11/19/2023   Hypoxia 11/19/2023   Allergic rhinitis 03/05/2022   Pulmonary congestion 02/11/2021   Aortic atherosclerosis 02/11/2021   Hypo-osmolar hyponatremia 02/10/2021   Primary progressive aphasia (HCC) 03/23/2020   Pain in left shoulder 11/03/2019   Acute metabolic encephalopathy 08/05/2019   Iron  deficiency anemia 08/05/2019   QT prolongation 08/05/2019   Hypothyroidism 02/12/2019   Speech articulation disorder 09/01/2018   Essential hypertension 10/03/2017   GAD (generalized anxiety disorder) 07/03/2017   Diabetes mellitus (HCC) 09/27/2016   Depression 03/29/2016   Insomnia 07/18/2015   Vitamin D  deficiency 10/11/2013   Right carotid bruit 03/02/2013   Degenerative disc disease, lumbar 11/27/2012   Asthma 11/13/2010   Type 2 diabetes mellitus with hyperlipidemia (HCC) 11/13/2010   Cancer of skin 11/13/2010   Diabetic neuropathy (HCC) 11/13/2010   Wide-complex tachycardia==Atrial Tachycardia 11/13/2010    Orientation RESPIRATION BLADDER Height & Weight     Self, Situation, Time, Place  Normal External  catheter Weight: 69.9 kg Height:  5' 4 (162.6 cm)  BEHAVIORAL SYMPTOMS/MOOD NEUROLOGICAL BOWEL NUTRITION STATUS      Continent Diet (heart healthy, carb modified)  AMBULATORY STATUS COMMUNICATION OF NEEDS Skin   Limited Assist Non-Verbally (gestures, follows commands) Other (Comment) (erythema/redness left heel; rash bilateral arms, back, and chest; wound left anterior great toe)                       Personal Care Assistance Level of Assistance  Bathing, Feeding, Dressing Bathing Assistance: Limited assistance Feeding assistance: Limited assistance Dressing Assistance: Limited assistance     Functional Limitations Info  Sight, Hearing, Speech Sight Info: Adequate Hearing Info: Impaired (bilateral hearing aids) Speech Info: Impaired (expressive aphasia at baseline)    SPECIAL CARE FACTORS FREQUENCY  PT (By licensed PT), OT (By licensed OT)     PT Frequency: 5x/week OT Frequency: 5x/week            Contractures Contractures Info: Not present    Additional Factors Info  Code Status, Allergies, Insulin  Sliding Scale Code Status Info: Full Code Allergies Info: Dimetapp C (Phenylephrine-bromphen-codeine), Lisinopril , Phenylephrine Hcl, Shingrix (Zoster Vac Recomb Adjuvanted), Ultram (Tramadol Hcl), Celebrex (Celecoxib), Cortisone, Fenofibrate, Sulfa Antibiotics   Insulin  Sliding Scale Info: See MAR       Current Medications (09/19/2024):  This is the current hospital active medication list Current Facility-Administered Medications  Medication Dose Route Frequency Provider Last Rate Last Admin   acetaminophen  (TYLENOL ) tablet 650 mg  650 mg Oral Q6H PRN Alfornia Madison, MD       Or   acetaminophen  (TYLENOL ) suppository 650 mg  650 mg Rectal Q6H PRN Alfornia Madison, MD  albuterol  (PROVENTIL ) (2.5 MG/3ML) 0.083% nebulizer solution 2.5 mg  2.5 mg Nebulization Q6H PRN Alfornia Madison, MD       [START ON 09/20/2024] amLODipine  (NORVASC ) tablet 5 mg  5 mg Oral  Daily Arrien, Elidia Sieving, MD       NOREEN ON 09/20/2024] cyanocobalamin  (VITAMIN B12) tablet 500 mcg  500 mcg Oral Daily Arrien, Mauricio Daniel, MD       [START ON 09/20/2024] doxazosin  (CARDURA ) tablet 8 mg  8 mg Oral Daily Arrien, Elidia Sieving, MD       enoxaparin  (LOVENOX ) injection 40 mg  40 mg Subcutaneous Q24H Rathore, Vasundhra, MD   40 mg at 09/19/24 0901   insulin  aspart (novoLOG ) injection 0-5 Units  0-5 Units Subcutaneous QHS Rathore, Vasundhra, MD       insulin  aspart (novoLOG ) injection 0-9 Units  0-9 Units Subcutaneous TID WC Rathore, Vasundhra, MD   3 Units at 09/19/24 1232   [START ON 09/20/2024] levothyroxine  (SYNTHROID ) tablet 75 mcg  75 mcg Oral Q0600 Arrien, Mauricio Daniel, MD       [START ON 09/20/2024] metoprolol  succinate (TOPROL -XL) 24 hr tablet 100 mg  100 mg Oral Daily Arrien, Elidia Sieving, MD       rosuvastatin  (CRESTOR ) tablet 20 mg  20 mg Oral QHS Arrien, Elidia Sieving, MD         Discharge Medications: Please see discharge summary for a list of discharge medications.  Relevant Imaging Results:  Relevant Lab Results:   Additional Information SSN [id number]  Sonda Manuella Quill, RN     "

## 2024-09-19 NOTE — Assessment & Plan Note (Addendum)
Continue glucose cover and monitoring with insulin sliding scale. Continue statin therapy.  

## 2024-09-19 NOTE — Evaluation (Signed)
 Occupational Therapy Evaluation Patient Details Name: Kristi Willis MRN: [id number] DOB: [birth year] Today's Date: 09/19/2024   History of Present Illness   Kristi Willis is a 84 y.o. female with medical history significant of primary progressive aphasia/nonverbal at baseline, hypertension, hyperlipidemia, type 2 diabetes, peripheral neuropathy, SVT, GERD, asthma, iron  deficiency anemia, hypothyroidism presented to the ED for evaluation of generalized weakness, altered mental status, recent fall, left great toe pain, and poor appetite.  Patient is not able to give any history given known aphasia.  No family available at this time.  Per review of ED documentation, family reported patient having increasing weakness and unsteadiness over the past month.  Patient lives alone and daughter usually checks on her and brings her meals.  Daughter went to check on the patient today and found her sitting on the floor unable to get back up.  After family helped her get up in a chair, patient was noted to be leaning to one side with difficulty sitting up straight.  She did not want to eat.  Patient's nurse was contacted by daughter and was advised to take the patient to the emergency department.  Patient also noted to have chronic fungal infection of her left great toe.  Left great toe seemed painful but it was uncertain if she sustained any injury when she fell.  At baseline, patient mostly just smiles and cannot communicate more than that.     Clinical Impressions Pt admitted with the above. Pt currently with functional limitations due to the deficits listed below (see OT Problem List).  Pt will benefit from acute skilled OT to increase their safety and independence with ADL and functional mobility for ADL to facilitate discharge.   Pts daughter agreeable to rehab to increase I with ADL activity prior to DC.  Overall 24/7 Supervison ideal for pt for safety      If plan is discharge home,  recommend the following:   A little help with walking and/or transfers;A little help with bathing/dressing/bathroom     Functional Status Assessment   Patient has had a recent decline in their functional status and demonstrates the ability to make significant improvements in function in a reasonable and predictable amount of time.     Equipment Recommendations   None recommended by OT     Recommendations for Other Services         Precautions/Restrictions   Precautions Precautions: Other (comment) Precaution/Restrictions Comments: pt aphasia- pt does not speak at all.     Mobility Bed Mobility Overal bed mobility: Needs Assistance Bed Mobility: Supine to Sit     Supine to sit: Min assist, HOB elevated     General bed mobility comments: provided HHA and pt self assisted with trunk upright    Transfers Overall transfer level: Needs assistance Equipment used: 2 person hand held assist Transfers: Sit to/from Stand Sit to Stand: Min assist           General transfer comment: provided bil HHA for stability/support (pt not used to using any assistive device and has difficulty processing cues quickly)      Balance Overall balance assessment: Needs assistance         Standing balance support: Bilateral upper extremity supported, During functional activity Standing balance-Leahy Scale: Poor                             ADL either performed or assessed with clinical judgement  ADL Overall ADL's : Needs assistance/impaired Eating/Feeding: Minimal assistance;Cueing for sequencing;Sitting Eating/Feeding Details (indicate cue type and reason): increased time and set up Grooming: Sitting;Minimal assistance Grooming Details (indicate cue type and reason): increased time             Lower Body Dressing: Maximal assistance Lower Body Dressing Details (indicate cue type and reason): cleaning up after BM.  DAugther reports accidents that pt  unable to clean up I ly as well. Daugther reports sisters are concerned               General ADL Comments: OT session focused on self feeding and obtaining info from daughter.  Pt was living alone with lots of support from daugther.  OT does have safety concerns for pt. Do not feel she would be able to dial 911 or call for A as needed     Vision   Vision Assessment?: No apparent visual deficits         Extremity/Trunk Assessment     Lower Extremity Assessment Lower Extremity Assessment: Generalized weakness   Cervical / Trunk Assessment Cervical / Trunk Assessment: Normal   Communication Communication Communication: Impaired Factors Affecting Communication: Difficulty expressing self   Cognition Arousal: Alert Behavior During Therapy: Flat affect                                 Following commands: Impaired Following commands impaired: Follows one step commands inconsistently     Cueing  General Comments   Cueing Techniques: Verbal cues;Gestural cues;Tactile cues;Visual cues   Increased time to complete ADL task           Home Living Family/patient expects to be discharged to:: Private residence Living Arrangements: Alone Available Help at Discharge: Family Type of Home: House Home Access: Stairs to enter [employer] of Steps: 6 at front with bil handrails and 2 at back without handrials   Home Layout: One level     Bathroom Shower/Tub: Runner, Broadcasting/film/video: Shower seat;Other (comment)          Prior Functioning/Environment Prior Level of Function : Independent/Modified Independent             Mobility Comments: family reports ind with community amb without AD ADLs Comments: family reports ind with self care and fixing snacks, family completes household chores    OT Problem List: Decreased strength;Decreased activity tolerance;Impaired balance (sitting and/or standing);Decreased safety  awareness;Decreased cognition   OT Treatment/Interventions: Self-care/ADL training;Patient/family education;Therapeutic activities      OT Goals(Current goals can be found in the care plan section)   Acute Rehab OT Goals Patient Stated Goal: daughter agreeable to rehab OT Goal Formulation: Patient unable to participate in goal setting Time For Goal Achievement: 10/03/24 Potential to Achieve Goals: Good ADL Goals Pt Will Perform Eating: with set-up;sitting Pt Will Perform Grooming: sitting Pt Will Transfer to Toilet: with set-up;ambulating Pt Will Perform Toileting - Clothing Manipulation and hygiene: with set-up;sit to/from stand   OT Frequency:  Min 2X/week       AM-PAC OT 6 Clicks Daily Activity     Outcome Measure Help from another person eating meals?: A Little Help from another person taking care of personal grooming?: A Little Help from another person toileting, which includes using toliet, bedpan, or urinal?: A Lot Help from another person bathing (including washing, rinsing, drying)?: A Lot Help from another person  to put on and taking off regular upper body clothing?: A Little Help from another person to put on and taking off regular lower body clothing?: A Lot 6 Click Score: 15   End of Session Nurse Communication: Mobility status  Activity Tolerance: Patient tolerated treatment well Patient left: in chair;with call bell/phone within reach;with family/visitor present  OT Visit Diagnosis: Unsteadiness on feet (R26.81);Other abnormalities of gait and mobility (R26.89);Repeated falls (R29.6);History of falling (Z91.81);Feeding difficulties (R63.3)                Time: 8743-8685 OT Time Calculation (min): 18 min Charges:  OT General Charges $OT Visit: 1 Visit OT Treatments $Self Care/Home Management : 8-22 mins    Nafisah Runions, Norvel D 09/19/2024, 2:06 PM

## 2024-09-19 NOTE — Assessment & Plan Note (Signed)
 Patient with progressive health decline at home over the last 4 weeks Her daughter is concerned about her decline.

## 2024-09-19 NOTE — TOC Initial Note (Addendum)
 Transition of Care [facility]) - Initial/Assessment Note    Patient Details  Name: Kristi Willis MRN: [id number] Date of Birth: 09-10-1940  [facility]) CM/SW Contact:    Sonda Manuella Quill, RN Phone Number: 09/19/2024, 4:22 PM  Clinical Narrative:                 IP CM consulted for SNF; spoke w/ pt's dtr Randall Bar over room phone; she can be contacted at [phone]; pt normally lives at home alone, family plans for pt admit short-term rehab at d/c; insurance/PCP verified; she denied pt experiencing SDOH risks; pt has bil HA; she also has walker; pt has been receiving HHPT from [facility], last visit 09/10/24; pt does not have home oxygen; she said pt can feed herself, and pt is incontinent of bowel and bladder; explained SNF process and ins auth needed; Ms Bullins requests bed search be limited to areas around [facility], excluding [address]; her facility of choice is [place]; obtained PASRR # [id number]; FL2 completed and sent for co-sign; faxed out for bed;   [facility] Preferred SNF  Pending - Request Sent  [facility] Preferred SNF  Pending - Request Sent  [facility]  Pending - Request [facility] Preferred SNF  Pending - Request Sent  [facility] Preferred SNF  Pending - Request Sent  [facility] Preferred    awaiting offers, and choice; ins auth needed.   Expected Discharge Plan: Skilled Nursing Facility Barriers to Discharge: Continued Medical Work up   Patient Goals and CMS Choice Patient states their goals for this hospitalization and ongoing recovery are:: pt will d/c to short-term rehab per her dtr Randall Bar     [place] ownership interest in [facility].provided to:: Adult Children    Expected Discharge Plan and Services   Discharge Planning Services: CM Consult   Living arrangements for the  past 2 months: Single Family Home                 DME Arranged: N/A DME Agency: NA       HH Arranged: NA HH Agency: NA        Prior Living Arrangements/Services Living arrangements for the past 2 months: Single Family Home Lives with:: Self Patient language and need for interpreter reviewed:: Yes Do you feel safe going back to the place where you live?: Yes      Need for Family Participation in Patient Care: Yes (Comment) Care giver support system in place?: Yes (comment) Current home services: DME (walker; HHPT w/ [facility]) Criminal Activity/Legal Involvement Pertinent to Current Situation/Hospitalization: No - Comment as needed  Activities of Daily Living      Permission Sought/Granted Permission sought to share information with : Case Manager Permission granted to share information with : Yes, Verbal Permission Granted  Share Information with NAME: Case Manager     Permission granted to share info w Relationship: Randall Bar (dtr) [phone]     Emotional Assessment Appearance:: Other (Comment Required (unable to assess) Attitude/Demeanor/Rapport: Unable to Assess Affect (typically observed): Unable to Assess Orientation: :  (unable to assess) Alcohol / Substance Use: Not Applicable Psych Involvement: No (comment)  Admission diagnosis:  Dehydration [E86.0] Neurodegenerative disorder [G31.9] Primary progressive aphasia (HCC) [G31.01, F02.80] Altered mental status, unspecified altered mental status type [R41.82] Altered mental status, unspecified [R41.82] Patient Active Problem List   Diagnosis Date Noted   Altered mental status, unspecified 09/19/2024  Dehydration 09/18/2024   Hyperglycemia 09/18/2024   Generalized weakness 11/19/2023   Hypoxia 11/19/2023   Allergic rhinitis 03/05/2022   Pulmonary congestion 02/11/2021   Aortic atherosclerosis 02/11/2021   Hypo-osmolar hyponatremia 02/10/2021   Primary progressive aphasia (HCC) 03/23/2020   Pain in  left shoulder 11/03/2019   Acute metabolic encephalopathy 08/05/2019   Iron  deficiency anemia 08/05/2019   QT prolongation 08/05/2019   Hypothyroidism 02/12/2019   Speech articulation disorder 09/01/2018   Essential hypertension 10/03/2017   GAD (generalized anxiety disorder) 07/03/2017   Diabetes mellitus (HCC) 09/27/2016   Depression 03/29/2016   Insomnia 07/18/2015   Vitamin D  deficiency 10/11/2013   Right carotid bruit 03/02/2013   Degenerative disc disease, lumbar 11/27/2012   Asthma 11/13/2010   Type 2 diabetes mellitus with hyperlipidemia (HCC) 11/13/2010   Cancer of skin 11/13/2010   Diabetic neuropathy (HCC) 11/13/2010   Wide-complex tachycardia==Atrial Tachycardia 11/13/2010   PCP:  Lavell Bari LABOR, FNP Pharmacy:   [facility] [place], [place] - [address] [place] [address] Phone: [phone] Fax: [phone]  [facility] [address] [place] [address] Phone: [phone] Fax: [phone]     Social Drivers of Health (SDOH) Social History: SDOH Screenings   Food Insecurity: No Food Insecurity (09/19/2024)  Housing: Low Risk (09/19/2024)  Transportation Needs: No Transportation Needs (09/19/2024)  Utilities: Not At Risk (09/19/2024)  Alcohol Screen: Low Risk (03/10/2024)  Depression (PHQ2-9): Low Risk (06/01/2024)  Financial Resource Strain: Low Risk (03/10/2024)  Physical Activity: Inactive (03/10/2024)  Social Connections: Socially Isolated (03/10/2024)  Stress: No Stress Concern Present (03/10/2024)  Tobacco Use: Low Risk (08/30/2024)  Health Literacy: Adequate Health Literacy (03/10/2024)   SDOH Interventions: Food Insecurity Interventions: Intervention Not Indicated, Inpatient TOC Housing Interventions: Intervention Not Indicated, Inpatient TOC Transportation Interventions: Intervention Not Indicated, Inpatient TOC Utilities Interventions: Intervention Not  Indicated, Inpatient TOC   Readmission Risk Interventions    09/19/2024    4:18 PM 11/19/2023    1:22 PM  Readmission Risk Prevention Plan  Transportation Screening Complete Complete  PCP or Specialist Appt within 5-7 Days Complete Complete  Home Care Screening Complete Complete  Medication Review (RN CM) Complete Complete

## 2024-09-19 NOTE — Progress Notes (Signed)
 Patients daughter to approach this RN with recent concerns regarding acute change in neuro status.  Stated  I think my Mom just had a seizure.  When asked to describe what was happening with her mom at that time.  Patient stated ' My mom started shivering all over really bad and then she had a blank stare.  Stated that it last for about a minute.  Charge RN notified to assist with care. MD notified awaiting return response. Primary RN updated on status.

## 2024-09-19 NOTE — Evaluation (Signed)
 Physical Therapy Evaluation Patient Details Name: Kristi Willis MRN: [id number] DOB: Oct 07, 1940 Today's Date: 09/19/2024  History of Present Illness  Kristi Willis is a 84 y.o. female with medical history significant of primary progressive aphasia/nonverbal at baseline, hypertension, hyperlipidemia, type 2 diabetes, peripheral neuropathy, SVT, GERD, asthma, iron  deficiency anemia, hypothyroidism presented to the ED for evaluation of generalized weakness, altered mental status, recent fall, left great toe pain, and poor appetite.  Patient is not able to give any history given known aphasia.  No family available at this time.  Per review of ED documentation, family reported patient having increasing weakness and unsteadiness over the past month.  Patient lives alone and daughter usually checks on her and brings her meals.  Daughter went to check on the patient today and found her sitting on the floor unable to get back up.  After family helped her get up in a chair, patient was noted to be leaning to one side with difficulty sitting up straight.  She did not want to eat.  Patient's nurse was contacted by daughter and was advised to take the patient to the emergency department.  Patient also noted to have chronic fungal infection of her left great toe.  Left great toe seemed painful but it was uncertain if she sustained any injury when she fell.  At baseline, patient mostly just smiles and cannot communicate more than that.  Clinical Impression  Pt admitted with above diagnosis.  Pt currently with functional limitations due to the deficits listed below (see PT Problem List). Pt will benefit from acute skilled PT to increase their independence and safety with mobility to allow discharge.  Daughter reports pt lives alone and was supposed to start HHPT and OT last week however this was delayed due to the weather.  Pt typically able to function around home without assistive device.  Pt was attempting to  still perform chores such as dusting however daughter reports poorly.  Daughter checks in on pt daily to bring food or take pt out to eat; she also has cameras to monitor.  Pt has hx of aphasia and does not speak, performs best with pointing to needs or gestures.  Daughter feels pt cannot safely return home alone.  Patient will benefit from continued inpatient follow up therapy, <3 hours/day.          If plan is discharge home, recommend the following: A lot of help with walking and/or transfers;A lot of help with bathing/dressing/bathroom;Assistance with cooking/housework;Assist for transportation;Help with stairs or ramp for entrance;Assistance with feeding;Supervision due to cognitive status;Direct supervision/assist for medications management   Can travel by private vehicle        Equipment Recommendations Rolling walker (2 wheels)  Recommendations for Other Services       Functional Status Assessment Patient has had a recent decline in their functional status and demonstrates the ability to make significant improvements in function in a reasonable and predictable amount of time.     Precautions / Restrictions Precautions Precautions: Fall Precaution/Restrictions Comments: pt has aphasia- pt does not speak at all.      Mobility  Bed Mobility Overal bed mobility: Needs Assistance Bed Mobility: Supine to Sit     Supine to sit: Min assist, HOB elevated     General bed mobility comments: provided HHA and pt self assisted with trunk upright    Transfers Overall transfer level: Needs assistance Equipment used: 2 person hand held assist Transfers: Sit to/from Stand Sit to Stand: Min  assist           General transfer comment: provided bil HHA for stability/support (pt not used to using any assistive device and has difficulty processing cues quickly)    Ambulation/Gait Ambulation/Gait assistance: Min assist Gait Distance (Feet): 30 Feet Assistive device: 2 person hand  held assist Gait Pattern/deviations: Step-through pattern, Decreased stride length, Narrow base of support       General Gait Details: visual and tactile cues for direction and distance  Stairs            Wheelchair Mobility     Tilt Bed    Modified Rankin (Stroke Patients Only)       Balance Overall balance assessment: Needs assistance         Standing balance support: Bilateral upper extremity supported, During functional activity Standing balance-Leahy Scale: Poor                               Pertinent Vitals/Pain Pain Assessment Pain Assessment: Faces Faces Pain Scale: No hurt Pain Intervention(s): Monitored during session, Repositioned    Home Living Family/patient expects to be discharged to:: Private residence Living Arrangements: Alone Available Help at Discharge: Family Type of Home: House Home Access: Stairs to enter   [employer] of Steps: 6 at front with bil handrails and 2 at back without handrials   Home Layout: One level Home Equipment: Shower seat;Other (comment)      Prior Function Prior Level of Function : Independent/Modified Independent             Mobility Comments: family reports ind with community amb without AD ADLs Comments: family reports ind with self care and fixing snacks, family completes household chores     Extremity/Trunk Assessment        Lower Extremity Assessment Lower Extremity Assessment: Generalized weakness    Cervical / Trunk Assessment Cervical / Trunk Assessment: Normal  Communication   Communication Communication: Impaired Factors Affecting Communication: Difficulty expressing self    Cognition Arousal: Alert Behavior During Therapy: Flat affect                           PT - Cognition Comments: daughter reports issues with communication and memory; performs better with gestural cues Following commands: Impaired Following commands impaired: Follows one  step commands inconsistently, Follows one step commands with increased time     Cueing Cueing Techniques: Verbal cues, Gestural cues, Tactile cues, Visual cues     General Comments      Exercises     Assessment/Plan    PT Assessment Patient needs continued PT services  PT Problem List Decreased strength;Decreased activity tolerance;Decreased balance;Decreased mobility;Decreased knowledge of use of DME;Decreased cognition;Decreased safety awareness       PT Treatment Interventions Gait training;DME instruction;Therapeutic exercise;Balance training;Functional mobility training;Therapeutic activities;Patient/family education;Cognitive remediation    PT Goals (Current goals can be found in the Care Plan section)  Acute Rehab PT Goals PT Goal Formulation: With patient/family Time For Goal Achievement: 10/03/24 Potential to Achieve Goals: Good    Frequency Min 2X/week     Co-evaluation               AM-PAC PT 6 Clicks Mobility  Outcome Measure Help needed turning from your back to your side while in a flat bed without using bedrails?: A Little Help needed moving from lying on your back to sitting on the side  of a flat bed without using bedrails?: A Lot Help needed moving to and from a bed to a chair (including a wheelchair)?: A Lot Help needed standing up from a chair using your arms (e.g., wheelchair or bedside chair)?: A Lot Help needed to walk in hospital room?: A Lot Help needed climbing 3-5 steps with a railing? : A Lot 6 Click Score: 13    End of Session Equipment Utilized During Treatment: Gait belt Activity Tolerance: Patient tolerated treatment well Patient left: in chair;with call bell/phone within reach;with family/visitor present;with chair alarm set   PT Visit Diagnosis: Difficulty in walking, not elsewhere classified (R26.2)    Time: 8765-8745 PT Time Calculation (min) (ACUTE ONLY): 20 min   Charges:   PT Evaluation $PT Eval Low Complexity: 1  Low   PT General Charges $$ ACUTE PT VISIT: 1 Visit        Tari PT, DPT Physical Therapist Acute Rehabilitation Services Office: [phone]   Kati L Payson 09/19/2024, 2:02 PM

## 2024-09-19 NOTE — Assessment & Plan Note (Signed)
 Resume levothyroxine 

## 2024-09-19 NOTE — Care Management Obs Status (Signed)
 MEDICARE OBSERVATION STATUS NOTIFICATION   Patient Details  Name: Kristi Willis MRN: [id number] Date of Birth: 1941-05-31   Medicare Observation Status Notification Given:  Yes    Sonda Manuella Quill, RN 09/19/2024, 4:17 PM

## 2024-09-20 DIAGNOSIS — G9341 Metabolic encephalopathy: Secondary | ICD-10-CM | POA: Diagnosis not present

## 2024-09-20 LAB — GLUCOSE, CAPILLARY
Glucose-Capillary: 207 mg/dL — ABNORMAL HIGH (ref 70–99)
Glucose-Capillary: 209 mg/dL — ABNORMAL HIGH (ref 70–99)
Glucose-Capillary: 305 mg/dL — ABNORMAL HIGH (ref 70–99)
Glucose-Capillary: 212 mg/dL — ABNORMAL HIGH (ref 70–99)

## 2024-09-20 MED ORDER — INSULIN ASPART 100 UNIT/ML IJ SOLN
0.0000 [IU] | Freq: Three times a day (TID) | INTRAMUSCULAR | Status: DC
  Administered 2024-09-20: 7 [IU] via SUBCUTANEOUS
  Administered 2024-09-21: 2 [IU] via SUBCUTANEOUS
  Administered 2024-09-21: 5 [IU] via SUBCUTANEOUS
  Filled 2024-09-20: qty 2
  Filled 2024-09-20: qty 5
  Filled 2024-09-20: qty 7

## 2024-09-20 NOTE — Plan of Care (Signed)

## 2024-09-20 NOTE — Progress Notes (Signed)
 Mobility Specialist - Progress Note   09/20/24 1018  Mobility  Activity Ambulated with assistance  Level of Assistance Minimal assist, patient does 75% or more  Assistive Device Other (Comment) (HHA, Hallway Rail)  Distance Ambulated (ft) 40 ft  Range of Motion/Exercises Active  Activity Response Tolerated fair  Mobility visit 1 Mobility  Mobility Specialist Start Time (ACUTE ONLY) 1000  Mobility Specialist Stop Time (ACUTE ONLY) 1018  Mobility Specialist Time Calculation (min) (ACUTE ONLY) 18 min   Pt was found in bed and agreeable to mobilize. At EOS returned to bed with all needs met. Call bell in reach and bed alarm on.   Erminio Leos,  Mobility Specialist Can be reached via Secure Chat

## 2024-09-20 NOTE — TOC Progression Note (Addendum)
 Transition of Care [facility]) - Progression Note    Patient Details  Name: Kristi Willis MRN: [id number] Date of Birth: 1941-05-12  [facility]) CM/SW Contact  Doneta Glenys DASEN, RN Phone Number: 09/20/2024, 12:26 PM  Clinical Narrative:    2:52 PM Insurance auth approved Plan Auth ID [id number] Next Review 09/24/2024 for [facility]. [facility] updated. Randall (daughter) updated. 12:26 PM Insurance auth started [place] ID [id number] .   Expected Discharge Plan: Skilled Nursing Facility Barriers to Discharge: Continued Medical Work up               Expected Discharge Plan and Services   Discharge Planning Services: CM Consult   Living arrangements for the past 2 months: Single Family Home                 DME Arranged: N/A DME Agency: NA       HH Arranged: NA HH Agency: NA         Social Drivers of Health (SDOH) Interventions SDOH Screenings   Food Insecurity: No Food Insecurity (09/19/2024)  Housing: Low Risk (09/19/2024)  Transportation Needs: No Transportation Needs (09/19/2024)  Utilities: Not At Risk (09/19/2024)  Alcohol Screen: Low Risk (03/10/2024)  Depression (PHQ2-9): Low Risk (06/01/2024)  Financial Resource Strain: Low Risk (03/10/2024)  Physical Activity: Inactive (03/10/2024)  Social Connections: Socially Isolated (03/10/2024)  Stress: No Stress Concern Present (03/10/2024)  Tobacco Use: Low Risk (08/30/2024)  Health Literacy: Adequate Health Literacy (03/10/2024)    Readmission Risk Interventions    09/19/2024    4:18 PM 11/19/2023    1:22 PM  Readmission Risk Prevention Plan  Transportation Screening Complete Complete  PCP or Specialist Appt within 5-7 Days Complete Complete  Home Care Screening Complete Complete  Medication Review (RN CM) Complete Complete

## 2024-09-21 DIAGNOSIS — G9341 Metabolic encephalopathy: Secondary | ICD-10-CM | POA: Diagnosis not present

## 2024-09-21 LAB — GLUCOSE, CAPILLARY
Glucose-Capillary: 196 mg/dL — ABNORMAL HIGH (ref 70–99)
Glucose-Capillary: 300 mg/dL — ABNORMAL HIGH (ref 70–99)

## 2024-09-21 NOTE — Progress Notes (Addendum)
 Physical Therapy Treatment Patient Details Name: Kristi Willis MRN: [id number] DOB: 03-Jun-1941 Today's Date: 09/21/2024   History of Present Illness Kristi Willis is a 84 y.o. female with medical history significant of primary progressive aphasia/nonverbal at baseline, hypertension, hyperlipidemia, type 2 diabetes, peripheral neuropathy, SVT, GERD, asthma, iron  deficiency anemia, hypothyroidism presented to the ED for evaluation of generalized weakness, altered mental status, recent fall, left great toe pain, and poor appetite.  Patient is not able to give any history given known aphasia.  No family available at this time.  Per review of ED documentation, family reported patient having increasing weakness and unsteadiness over the past month.  Patient lives alone and daughter usually checks on her and brings her meals.  Daughter went to check on the patient today and found her sitting on the floor unable to get back up.  After family helped her get up in a chair, patient was noted to be leaning to one side with difficulty sitting up straight.  She did not want to eat.  Patient's nurse was contacted by daughter and was advised to take the patient to the emergency department.  Patient also noted to have chronic fungal infection of her left great toe.  Left great toe seemed painful but it was uncertain if she sustained any injury when she fell.  At baseline, patient mostly just smiles and cannot communicate more than that.    PT Comments   AxO x1. pt was non-verbal. Daughter reports issues with communication, memory and HOH. Pt non compliant wearing her hearing aids. Upon arrival, pt was sitting in the chair waiting for her daughter to come back with a cup of fruit. From recliner assisted Pt to amb to the bathroom holding both hands due to her AMS and inability to properly use a walker.  Pt assisted to the bathroom requring max VC's. Overall balance was steady during donning/doffing pants.. Assisted  Pt with ambulation 45 ft HHA. pt required visial and tactile cues for direction and safety.  LPT has rec SNF. Pt plans to discharge to [facility].    If plan is discharge home, recommend the following: A lot of help with walking and/or transfers;A lot of help with bathing/dressing/bathroom;Assistance with cooking/housework;Assist for transportation;Help with stairs or ramp for entrance;Assistance with feeding;Supervision due to cognitive status;Direct supervision/assist for medications management   Can travel by private vehicle        Equipment Recommendations  Rolling walker (2 wheels)    Recommendations for Other Services       Precautions / Restrictions Precautions Precautions: Fall Precaution/Restrictions Comments: pt has aphasia- pt does not speak at all. Restrictions Weight Bearing Restrictions Per Provider Order: No     Mobility  Bed Mobility                    Transfers Overall transfer level: Needs assistance Equipment used: 1 person hand held assist Transfers: Sit to/from Stand Sit to Stand: Min assist, Mod assist           General transfer comment: assisted pt to the bathroom holding both hands due to the pt not able to communcate. pt transfered to the bathroom requring max VC's.    Ambulation/Gait Ambulation/Gait assistance: Contact guard assist Gait Distance (Feet): 45 Feet Assistive device: 1 person hand held assist Gait Pattern/deviations: Step-through pattern, Decreased stride length, Narrow base of support       General Gait Details: during ambulation pt required visial and tactile cues for direction and  distance.   Stairs             Wheelchair Mobility     Tilt Bed    Modified Rankin (Stroke Patients Only)       Balance                                            Communication Communication Communication: Impaired Factors Affecting Communication: Difficulty expressing self;Hearing  impaired;Reduced clarity of speech  Cognition Arousal: Alert Behavior During Therapy: Flat affect                           PT - Cognition Comments: AxO x1. pt was non-verbal. daughter reports issues with communication, memory and hearing due to the pt not wanting to wear her hearing aids. Following commands: Impaired Following commands impaired: Follows one step commands with increased time, Follows one step commands inconsistently    Cueing Cueing Techniques: Verbal cues, Gestural cues, Tactile cues, Visual cues  Exercises      General Comments        Pertinent Vitals/Pain      Home Living                          Prior Function            PT Goals (current goals can now be found in the care plan section) Progress towards PT goals: Progressing toward goals    Frequency    Min 2X/week      PT Plan      Co-evaluation              AM-PAC PT 6 Clicks Mobility   Outcome Measure  Help needed turning from your back to your side while in a flat bed without using bedrails?: A Little Help needed moving from lying on your back to sitting on the side of a flat bed without using bedrails?: A Lot Help needed moving to and from a bed to a chair (including a wheelchair)?: A Lot Help needed standing up from a chair using your arms (e.g., wheelchair or bedside chair)?: A Lot Help needed to walk in hospital room?: A Lot Help needed climbing 3-5 steps with a railing? : A Lot 6 Click Score: 13    End of Session Equipment Utilized During Treatment: Gait belt Activity Tolerance: Patient tolerated treatment well Patient left: in chair;with call bell/phone within reach;with family/visitor present;with chair alarm set   PT Visit Diagnosis: Difficulty in walking, not elsewhere classified (R26.2)     Time: 8774-8748 PT Time Calculation (min) (ACUTE ONLY): 26 min  Charges:    $Gait Training: 8-22 mins $Therapeutic Activity: 8-22 mins PT General  Charges $$ ACUTE PT VISIT: 1 Visit                    Laneta Edison, SPTA   I agree with the following treatment note.  This session was performed under the supervision of a licensed clinician  Katheryn Leap  PTA Acute  Rehabilitation Services Office M-F          [phone]

## 2024-09-21 NOTE — Plan of Care (Signed)

## 2024-09-21 NOTE — TOC Transition Note (Signed)
 Transition of Care [facility]) - Discharge Note   Patient Details  Name: Kristi Willis MRN: [id number] Date of Birth: [birth year]  [facility]) CM/SW Contact:  Doneta Glenys DASEN, RN Phone Number: 09/21/2024, 1:31 PM   Clinical Narrative:     Per MD patient ready for DC to [facility]. RN to call report prior to discharge [phone] Rm 38). RN, patient, patient's family, and facility notified of DC. Discharge Summary and FL2 sent to facility via HUB. Patients daughter Randall will transport to [facility]. CM spoke with Harriet Neptune for transport approval.    CM signing off. Please consult us  again if new needs arise.     Final next level of care: IP Rehab Facility Barriers to Discharge: Barriers Resolved   Patient Goals and CMS Choice Patient states their goals for this hospitalization and ongoing recovery are:: per Randall Harriet CMS Medicare.gov Compare Post Acute Care list provided to:: Patient Represenative (must comment) Jessica) Choice offered to / list presented to : Adult Children [place] ownership interest in [facility].provided to:: Adult Children    Discharge Placement   Existing PASRR number confirmed : 09/21/24          Patient [facility] at[facility] Patient to be transferred to facility by: [facility] Name of family member notified: Randall Patient and family notified of of transfer: 09/21/24  Discharge Plan and Services Additional resources added to the After Visit Summary for     Discharge Planning Services: CM Consult            DME Arranged: N/A DME Agency: NA       HH Arranged: NA HH Agency: NA        Social Drivers of Health (SDOH) Interventions SDOH Screenings   Food Insecurity: No Food Insecurity (09/19/2024)  Housing: Low Risk (09/19/2024)  Transportation Needs: No Transportation Needs (09/19/2024)  Utilities: Not At Risk (09/19/2024)  Alcohol Screen: Low Risk (03/10/2024)  Depression (PHQ2-9): Low Risk  (06/01/2024)  Financial Resource Strain: Low Risk (03/10/2024)  Physical Activity: Inactive (03/10/2024)  Social Connections: Socially Isolated (03/10/2024)  Stress: No Stress Concern Present (03/10/2024)  Tobacco Use: Low Risk (08/30/2024)  Health Literacy: Adequate Health Literacy (03/10/2024)     Readmission Risk Interventions    09/19/2024    4:18 PM 11/19/2023    1:22 PM  Readmission Risk Prevention Plan  Transportation Screening Complete Complete  PCP or Specialist Appt within 5-7 Days Complete Complete  Home Care Screening Complete Complete  Medication Review (RN CM) Complete Complete

## 2024-10-14 ENCOUNTER — Ambulatory Visit: Payer: Self-pay | Admitting: Family

## 2025-03-14 ENCOUNTER — Ambulatory Visit: Payer: Self-pay
